# Patient Record
Sex: Male | Born: 1955
Health system: Southern US, Community
[De-identification: ages and names within clinical notes are randomized; demographics above are authoritative.]

## PROBLEM LIST (undated history)

## (undated) DIAGNOSIS — K219 Gastro-esophageal reflux disease without esophagitis: Secondary | ICD-10-CM

## (undated) DIAGNOSIS — E785 Hyperlipidemia, unspecified: Secondary | ICD-10-CM

## (undated) DIAGNOSIS — K56609 Unspecified intestinal obstruction, unspecified as to partial versus complete obstruction: Secondary | ICD-10-CM

## (undated) DIAGNOSIS — N189 Chronic kidney disease, unspecified: Secondary | ICD-10-CM

## (undated) DIAGNOSIS — Z9289 Personal history of other medical treatment: Secondary | ICD-10-CM

## (undated) DIAGNOSIS — R0600 Dyspnea, unspecified: Secondary | ICD-10-CM

## (undated) DIAGNOSIS — I219 Acute myocardial infarction, unspecified: Secondary | ICD-10-CM

## (undated) DIAGNOSIS — I10 Essential (primary) hypertension: Secondary | ICD-10-CM

## (undated) DIAGNOSIS — Z992 Dependence on renal dialysis: Secondary | ICD-10-CM

## (undated) HISTORY — PX: ARTERIOVENOUS GRAFT PLACEMENT: SUR1029

## (undated) HISTORY — DX: Chronic kidney disease, unspecified: N18.9

## (undated) HISTORY — DX: Unspecified intestinal obstruction, unspecified as to partial versus complete obstruction: K56.609

## (undated) HISTORY — PX: SMALL INTESTINE SURGERY: SHX150

---

## 2007-08-22 ENCOUNTER — Ambulatory Visit (HOSPITAL_COMMUNITY): Admission: RE | Admit: 2007-08-22 | Discharge: 2007-08-22 | Payer: Self-pay | Admitting: Nephrology

## 2008-03-08 ENCOUNTER — Ambulatory Visit (HOSPITAL_COMMUNITY): Admission: RE | Admit: 2008-03-08 | Discharge: 2008-03-08 | Payer: Self-pay | Admitting: Nephrology

## 2008-04-23 ENCOUNTER — Encounter: Admission: RE | Admit: 2008-04-23 | Discharge: 2008-04-23 | Payer: Self-pay | Admitting: Nephrology

## 2008-12-06 ENCOUNTER — Emergency Department (HOSPITAL_COMMUNITY): Admission: EM | Admit: 2008-12-06 | Discharge: 2008-12-06 | Payer: Self-pay | Admitting: Emergency Medicine

## 2008-12-16 ENCOUNTER — Ambulatory Visit: Payer: Self-pay | Admitting: *Deleted

## 2009-01-28 ENCOUNTER — Encounter: Admission: RE | Admit: 2009-01-28 | Discharge: 2009-01-28 | Payer: Self-pay | Admitting: Internal Medicine

## 2009-07-21 ENCOUNTER — Emergency Department (HOSPITAL_COMMUNITY): Admission: EM | Admit: 2009-07-21 | Discharge: 2009-07-21 | Payer: Self-pay | Admitting: Emergency Medicine

## 2010-01-25 ENCOUNTER — Ambulatory Visit (HOSPITAL_COMMUNITY): Admission: RE | Admit: 2010-01-25 | Discharge: 2010-01-25 | Payer: Self-pay | Admitting: Nephrology

## 2010-03-24 ENCOUNTER — Ambulatory Visit: Payer: Self-pay | Admitting: Vascular Surgery

## 2010-03-31 ENCOUNTER — Ambulatory Visit: Payer: Self-pay | Admitting: Vascular Surgery

## 2010-06-25 ENCOUNTER — Inpatient Hospital Stay (HOSPITAL_COMMUNITY)
Admission: EM | Admit: 2010-06-25 | Discharge: 2010-07-16 | Payer: Self-pay | Source: Home / Self Care | Attending: Internal Medicine | Admitting: Internal Medicine

## 2010-06-25 ENCOUNTER — Ambulatory Visit: Payer: Self-pay | Admitting: Internal Medicine

## 2010-06-30 ENCOUNTER — Encounter: Payer: Self-pay | Admitting: Internal Medicine

## 2010-07-05 ENCOUNTER — Encounter: Payer: Self-pay | Admitting: Internal Medicine

## 2010-07-07 ENCOUNTER — Encounter (INDEPENDENT_AMBULATORY_CARE_PROVIDER_SITE_OTHER): Payer: Self-pay | Admitting: Internal Medicine

## 2010-08-21 ENCOUNTER — Encounter: Payer: Self-pay | Admitting: Nephrology

## 2010-08-29 NOTE — Procedures (Signed)
Summary: Colonoscopy  Patient: Jon [name] Note: All result statuses are Final unless otherwise noted.  Tests: (1) Colonoscopy (COL)   COL Colonoscopy           [facility]     [place], [place]  [address]           COLONOSCOPY PROCEDURE REPORT           PATIENT:  Jon, [name]  MR#:  [id number]     BIRTHDATE:  [birth year], 53 yrs. old  GENDER:  male     ENDOSCOPIST:  Gatha Mayer, MD, [facility]     REF. BY:  [facility],     PROCEDURE DATE:  06/30/2010     PROCEDURE:  Colonoscopy [id number]     ASA CLASS:  Class III     INDICATIONS:  Abnormal CT of abdomen RLQ pain, ileitis on CT     MEDICATIONS:   Fentanyl 75 mcg IV, Versed 5.5 mg IV           DESCRIPTION OF PROCEDURE:   After the risks benefits and     alternatives of the procedure were thoroughly explained, informed     consent was obtained.  Digital rectal exam was performed and     revealed no abnormalities and normal prostate.   The Pentax     Colonoscope [id number] endoscope was introduced through the anus and     advanced to the terminal ileum which was intubated for a short     distance, limited by fair prep.    The quality of the prep was     fair, using Colyte.  The instrument was then slowly withdrawn as     the colon was fully examined.     <<PROCEDUREIMAGES>>           FINDINGS:  The terminal ileum appeared normal. It could only e     entered in its very distal aspect and he was very sensitive to     this. It could be stenotic as it did not open up, vs. technical     issues with scope insertion.  A normal appearing cecum, ileocecal     valve, and appendiceal orifice were identified. The ascending,     hepatic flexure, transverse, splenic flexure, descending, sigmoid     colon, and rectum appeared unremarkable.   Retroflexed views in     the rectum revealed no abnormalities.    The scope was then     withdrawn from the patient and the procedure completed.        COMPLICATIONS:  None     ENDOSCOPIC IMPRESSION:     1) Normal terminal ileum, very distal aspect only  seen     2) Normal colon     RECOMMENDATIONS:     Continue antibiotics and supportive care. Cause of problems not     found on this exam though could not enter terminal ileum far.     Infection, ischemia and Crohn's remain in the differential.           Re-image (CT vs CT-E) once he is clinically resolved or     significantly better, or do sooner if worse/fails to resolve.     REPEAT EXAM:  In for Colonoscopy. at some point in next 1-2 years     as this procedure was not accurate enough to serve as a screening  colonoscopy.           Gatha Mayer, MD, [facility]           CC:  Jana Hakim, MD     Edrick Oh, MD           n.     eSIGNED:   Gatha Mayer at 06/30/2010 01:02 PM           [employer], [id number]  Note: An exclamation mark (!) indicates a result that was not dispersed into the flowsheet. Document Creation Date: 06/30/2010 1:02 PM _______________________________________________________________________  (1) Order result status: Final Collection or observation date-time: 06/30/2010 12:52 Requested date-time:  Receipt date-time:  Reported date-time:  Referring Physician:   Ordering Physician: Silvano Rusk [phone]) Specimen Source:  Source: Tawanna Cooler Order Number: [id number] Lab site:

## 2010-08-29 NOTE — Procedures (Signed)
Summary: Upper Endoscopy  Patient: Jon [name] Note: All result statuses are Final unless otherwise noted.  Tests: (1) Upper Endoscopy (EGD)   EGD Upper Endoscopy       [facility]     [place], [place]  [address]           ENDOSCOPY PROCEDURE REPORT           PATIENT:  Jon, [name]  MR#:  [id number]     BIRTHDATE:  [birth year], 54 yrs. old  GENDER:  male           ENDOSCOPIST:  Docia Chuck. Geri Seminole, MD     Referred by:  [facility],           PROCEDURE DATE:  07/05/2010     PROCEDURE:  EGD with dilatation over guidewire,     Fluoroscopy to assist with     esophageal dilation     EGD with biopsy, [id number]     ASA CLASS:  Class II     INDICATIONS:  dysphagia ; intermittent solids           MEDICATIONS:   Fentanyl 100 mcg IV, Versed 6 mg IV     TOPICAL ANESTHETIC:  Cetacaine Spray           DESCRIPTION OF PROCEDURE:   After the risks benefits and     alternatives of the procedure were thoroughly explained, informed     consent was obtained.  The [id number]) endoscope was     introduced through the mouth and advanced to the second portion of     the duodenum, without limitations.  The instrument was slowly     withdrawn as the mucosa was fully examined.     <<PROCEDUREIMAGES>>           The esophagus and gastroesophageal junction were completely normal     in appearance.  An erosion was found in the antrum.CLO Bx taken.     Otherwise normal stomach.  The duodenal bulb was normal in     appearance, as was the postbulbar duodenum.    Retroflexed views     revealed a hiatal hernia.           THERAPY: SAVARY GUIDEWIRE PLACED IN GASTRIC ANTRUM VIA     FLUOROSCOPY. 18MM DILATOR PASSED OVER AND MONITORED VIA FLUORO. NO     RESISTANCE OR HEME, TOLERATED WELL           COMPLICATIONS:  None           ENDOSCOPIC IMPRESSION:     1) Normal esophagus. May have subtle ring - s/p dilation 104mm     2) Erosion in the antrum - s/p CLO  test     3) Otherwise normal stomach     4) Normal duodenum     5) A hiatal hernia           RECOMMENDATIONS:     1) Clear liquids until 6pm, then soft foods rest of day. Resume     prior diet tomorrow     2) Treat CLO if +     3) GI follow-up prn           _____________________________     Docia Chuck. Geri Seminole, MD           CC:  Carol Ada, MD,  Silvano Rusk, MD,  Edrick Oh, MD  n.     eSIGNED:   Docia Chuck. Geri Seminole at 07/05/2010 04:00 PM           [employer], [id number]  Note: An exclamation mark (!) indicates a result that was not dispersed into the flowsheet. Document Creation Date: 07/05/2010 4:03 PM _______________________________________________________________________  (1) Order result status: Final Collection or observation date-time: 07/05/2010 15:51 Requested date-time:  Receipt date-time:  Reported date-time:  Referring Physician:   Ordering Physician: Lavena Bullion [phone]) Specimen Source:  Source: Tawanna Cooler Order Number: [id number] Lab site:   Appended Document: Upper Endoscopy L-Clotest (H. pylori), Biopsy - STATUS: Final                                            Perform Date: 7 Dec11 15:45  Ordered By: Ranelle Oyster Date: 7 Dec11 16:24                                       Last Updated Date: 8 Dec11 15:49  Facility: [facility]                              Department: [facility]  Accession #: [id number]                  USN:       [id number]  Findings  Result Name                              Result     Abnl   Normal Range     Units      Perf. Loc.  Helicobacter Screen                      SEE NOTE.         URENEG    UREASE NEGATIVE    CLOTEST DETECTS THE UREASE    ENZYME OF HELICOBACTER    PYLORI IN GASTRIC MUCOSAL    BIOPSIES.  Additional Information  HL7 RESULT STATUS : F  External IF Update Timestamp : 2010-07-06:15:48:00.000000

## 2010-10-09 LAB — RENAL FUNCTION PANEL
Albumin: 2.1 g/dL — ABNORMAL LOW (ref 3.5–5.2)
Albumin: 2.3 g/dL — ABNORMAL LOW (ref 3.5–5.2)
Albumin: 2.7 g/dL — ABNORMAL LOW (ref 3.5–5.2)
BUN: 16 mg/dL (ref 6–23)
Calcium: 9.2 mg/dL (ref 8.4–10.5)
Calcium: 9.6 mg/dL (ref 8.4–10.5)
Chloride: 93 mEq/L — ABNORMAL LOW (ref 96–112)
GFR calc Af Amer: 3 mL/min — ABNORMAL LOW (ref >60)
GFR calc Af Amer: 4 mL/min — ABNORMAL LOW (ref >60)
GFR calc Af Amer: 6 mL/min — ABNORMAL LOW (ref >60)
GFR calc non Af Amer: 3 mL/min — ABNORMAL LOW (ref >60)
GFR calc non Af Amer: 5 mL/min — ABNORMAL LOW (ref >60)
Glucose, Bld: 92 mg/dL (ref 70–99)
Phosphorus: 4.4 mg/dL (ref 2.3–4.6)
Phosphorus: 6.2 mg/dL — ABNORMAL HIGH (ref 2.3–4.6)
Phosphorus: 8.1 mg/dL — ABNORMAL HIGH (ref 2.3–4.6)
Potassium: 3.6 mEq/L (ref 3.5–5.1)
Potassium: 3.9 mEq/L (ref 3.5–5.1)
Potassium: 3.9 mEq/L (ref 3.5–5.1)
Sodium: 133 mEq/L — ABNORMAL LOW (ref 135–145)
Sodium: 136 mEq/L (ref 135–145)
Sodium: 138 mEq/L (ref 135–145)

## 2010-10-09 LAB — CROSSMATCH
ABO/RH(D): O NEG
Antibody Screen: POSITIVE
Donor AG Type: NEGATIVE
Donor AG Type: NEGATIVE
Unit division: 0
Unit division: 0

## 2010-10-09 LAB — CBC
HCT: 26.2 % — ABNORMAL LOW (ref 39.0–52.0)
Hemoglobin: 8.5 g/dL — ABNORMAL LOW (ref 13.0–17.0)
MCHC: 32.4 g/dL (ref 30.0–36.0)
MCHC: 33.6 g/dL (ref 30.0–36.0)
Platelets: 242 10*3/uL (ref 150–400)
Platelets: 303 10*3/uL (ref 150–400)
RBC: 3.38 MIL/uL — ABNORMAL LOW (ref 4.22–5.81)
RDW: 14.7 % (ref 11.5–15.5)
RDW: 14.7 % (ref 11.5–15.5)
RDW: 14.9 % (ref 11.5–15.5)
WBC: 6.8 10*3/uL (ref 4.0–10.5)
WBC: 7.3 10*3/uL (ref 4.0–10.5)
WBC: 8 10*3/uL (ref 4.0–10.5)

## 2010-10-10 LAB — URINALYSIS, MICROSCOPIC ONLY
Ketones, ur: 15 mg/dL — AB
Nitrite: NEGATIVE
pH: 5.5 (ref 5.0–8.0)

## 2010-10-10 LAB — TYPE AND SCREEN
ABO/RH(D): O NEG
DAT, IgG: NEGATIVE
Donor AG Type: NEGATIVE
Donor AG Type: NEGATIVE
Unit division: 0
Unit division: 0

## 2010-10-10 LAB — CBC
HCT: 29 % — ABNORMAL LOW (ref 39.0–52.0)
HCT: 29.4 % — ABNORMAL LOW (ref 39.0–52.0)
HCT: 29.8 % — ABNORMAL LOW (ref 39.0–52.0)
HCT: 30.1 % — ABNORMAL LOW (ref 39.0–52.0)
HCT: 31.4 % — ABNORMAL LOW (ref 39.0–52.0)
HCT: 31.9 % — ABNORMAL LOW (ref 39.0–52.0)
HCT: 33.3 % — ABNORMAL LOW (ref 39.0–52.0)
HCT: 34 % — ABNORMAL LOW (ref 39.0–52.0)
HCT: 36 % — ABNORMAL LOW (ref 39.0–52.0)
HCT: 36.6 % — ABNORMAL LOW (ref 39.0–52.0)
Hemoglobin: 10 g/dL — ABNORMAL LOW (ref 13.0–17.0)
Hemoglobin: 10.5 g/dL — ABNORMAL LOW (ref 13.0–17.0)
Hemoglobin: 10.5 g/dL — ABNORMAL LOW (ref 13.0–17.0)
Hemoglobin: 10.6 g/dL — ABNORMAL LOW (ref 13.0–17.0)
Hemoglobin: 10.9 g/dL — ABNORMAL LOW (ref 13.0–17.0)
Hemoglobin: 12 g/dL — ABNORMAL LOW (ref 13.0–17.0)
Hemoglobin: 8.3 g/dL — ABNORMAL LOW (ref 13.0–17.0)
Hemoglobin: 9.9 g/dL — ABNORMAL LOW (ref 13.0–17.0)
MCH: 30 pg (ref 26.0–34.0)
MCH: 30.1 pg (ref 26.0–34.0)
MCH: 30.4 pg (ref 26.0–34.0)
MCH: 30.4 pg (ref 26.0–34.0)
MCH: 30.4 pg (ref 26.0–34.0)
MCH: 30.5 pg (ref 26.0–34.0)
MCH: 30.5 pg (ref 26.0–34.0)
MCH: 30.6 pg (ref 26.0–34.0)
MCH: 30.7 pg (ref 26.0–34.0)
MCH: 31.3 pg (ref 26.0–34.0)
MCHC: 32.5 g/dL (ref 30.0–36.0)
MCHC: 32.6 g/dL (ref 30.0–36.0)
MCHC: 32.7 g/dL (ref 30.0–36.0)
MCHC: 33.1 g/dL (ref 30.0–36.0)
MCHC: 33.3 g/dL (ref 30.0–36.0)
MCHC: 33.3 g/dL (ref 30.0–36.0)
MCHC: 33.6 g/dL (ref 30.0–36.0)
MCV: 89.9 fL (ref 78.0–100.0)
MCV: 90.9 fL (ref 78.0–100.0)
MCV: 91 fL (ref 78.0–100.0)
MCV: 91.3 fL (ref 78.0–100.0)
MCV: 91.6 fL (ref 78.0–100.0)
MCV: 91.9 fL (ref 78.0–100.0)
MCV: 92.8 fL (ref 78.0–100.0)
MCV: 92.8 fL (ref 78.0–100.0)
Platelets: 158 10*3/uL (ref 150–400)
Platelets: 208 10*3/uL (ref 150–400)
Platelets: 270 10*3/uL (ref 150–400)
Platelets: 293 10*3/uL (ref 150–400)
Platelets: 305 10*3/uL (ref 150–400)
Platelets: 322 10*3/uL (ref 150–400)
Platelets: 326 10*3/uL (ref 150–400)
RBC: 2.77 MIL/uL — ABNORMAL LOW (ref 4.22–5.81)
RBC: 2.99 MIL/uL — ABNORMAL LOW (ref 4.22–5.81)
RBC: 3.2 MIL/uL — ABNORMAL LOW (ref 4.22–5.81)
RBC: 3.27 MIL/uL — ABNORMAL LOW (ref 4.22–5.81)
RBC: 3.36 MIL/uL — ABNORMAL LOW (ref 4.22–5.81)
RBC: 3.47 MIL/uL — ABNORMAL LOW (ref 4.22–5.81)
RBC: 3.55 MIL/uL — ABNORMAL LOW (ref 4.22–5.81)
RBC: 3.92 MIL/uL — ABNORMAL LOW (ref 4.22–5.81)
RDW: 14.2 % (ref 11.5–15.5)
RDW: 14.2 % (ref 11.5–15.5)
RDW: 14.3 % (ref 11.5–15.5)
RDW: 14.4 % (ref 11.5–15.5)
RDW: 14.5 % (ref 11.5–15.5)
RDW: 14.6 % (ref 11.5–15.5)
RDW: 14.7 % (ref 11.5–15.5)
RDW: 14.9 % (ref 11.5–15.5)
WBC: 11.6 10*3/uL — ABNORMAL HIGH (ref 4.0–10.5)
WBC: 11.8 10*3/uL — ABNORMAL HIGH (ref 4.0–10.5)
WBC: 11.8 10*3/uL — ABNORMAL HIGH (ref 4.0–10.5)
WBC: 12 10*3/uL — ABNORMAL HIGH (ref 4.0–10.5)
WBC: 13.5 10*3/uL — ABNORMAL HIGH (ref 4.0–10.5)
WBC: 15.2 10*3/uL — ABNORMAL HIGH (ref 4.0–10.5)
WBC: 19.2 10*3/uL — ABNORMAL HIGH (ref 4.0–10.5)
WBC: 9.4 10*3/uL (ref 4.0–10.5)

## 2010-10-10 LAB — COMPREHENSIVE METABOLIC PANEL
ALT: 10 U/L (ref 0–53)
Albumin: 2.8 g/dL — ABNORMAL LOW (ref 3.5–5.2)
Alkaline Phosphatase: 51 U/L (ref 39–117)
CO2: 27 mEq/L (ref 19–32)
Calcium: 9.8 mg/dL (ref 8.4–10.5)
Creatinine, Ser: 14.03 mg/dL — ABNORMAL HIGH (ref 0.4–1.5)
GFR calc non Af Amer: 4 mL/min — ABNORMAL LOW (ref >60)
Glucose, Bld: 112 mg/dL — ABNORMAL HIGH (ref 70–99)
Potassium: 3.6 mEq/L (ref 3.5–5.1)
Sodium: 137 mEq/L (ref 135–145)
Total Protein: 7.5 g/dL (ref 6.0–8.3)

## 2010-10-10 LAB — DIFFERENTIAL
Basophils Absolute: 0 10*3/uL (ref 0.0–0.1)
Basophils Absolute: 0 10*3/uL (ref 0.0–0.1)
Basophils Absolute: 0 10*3/uL (ref 0.0–0.1)
Eosinophils Absolute: 0 10*3/uL (ref 0.0–0.7)
Eosinophils Absolute: 0.1 10*3/uL (ref 0.0–0.7)
Eosinophils Absolute: 0.3 10*3/uL (ref 0.0–0.7)
Eosinophils Relative: 0 % (ref 0–5)
Lymphocytes Relative: 10 % — ABNORMAL LOW (ref 12–46)
Lymphocytes Relative: 10 % — ABNORMAL LOW (ref 12–46)
Lymphocytes Relative: 10 % — ABNORMAL LOW (ref 12–46)
Lymphocytes Relative: 7 % — ABNORMAL LOW (ref 12–46)
Lymphs Abs: 1.1 10*3/uL (ref 0.7–4.0)
Lymphs Abs: 1.1 10*3/uL (ref 0.7–4.0)
Lymphs Abs: 1.3 10*3/uL (ref 0.7–4.0)
Lymphs Abs: 1.4 10*3/uL (ref 0.7–4.0)
Lymphs Abs: 1.9 10*3/uL (ref 0.7–4.0)
Monocytes Absolute: 0.5 10*3/uL (ref 0.1–1.0)
Monocytes Absolute: 0.6 10*3/uL (ref 0.1–1.0)
Monocytes Absolute: 0.6 10*3/uL (ref 0.1–1.0)
Monocytes Relative: 4 % (ref 3–12)
Neutro Abs: 11.6 10*3/uL — ABNORMAL HIGH (ref 1.7–7.7)
Neutro Abs: 9.6 10*3/uL — ABNORMAL HIGH (ref 1.7–7.7)
Neutro Abs: 9.7 10*3/uL — ABNORMAL HIGH (ref 1.7–7.7)
Neutrophils Relative %: 80 % — ABNORMAL HIGH (ref 43–77)
Neutrophils Relative %: 82 % — ABNORMAL HIGH (ref 43–77)
Neutrophils Relative %: 83 % — ABNORMAL HIGH (ref 43–77)
Neutrophils Relative %: 90 % — ABNORMAL HIGH (ref 43–77)

## 2010-10-10 LAB — BASIC METABOLIC PANEL
BUN: 29 mg/dL — ABNORMAL HIGH (ref 6–23)
BUN: 31 mg/dL — ABNORMAL HIGH (ref 6–23)
BUN: 32 mg/dL — ABNORMAL HIGH (ref 6–23)
BUN: 36 mg/dL — ABNORMAL HIGH (ref 6–23)
CO2: 22 mEq/L (ref 19–32)
CO2: 26 mEq/L (ref 19–32)
CO2: 26 mEq/L (ref 19–32)
CO2: 27 mEq/L (ref 19–32)
Calcium: 9.5 mg/dL (ref 8.4–10.5)
Calcium: 9.7 mg/dL (ref 8.4–10.5)
Chloride: 94 mEq/L — ABNORMAL LOW (ref 96–112)
Chloride: 94 mEq/L — ABNORMAL LOW (ref 96–112)
Chloride: 96 mEq/L (ref 96–112)
Chloride: 98 mEq/L (ref 96–112)
Chloride: 99 mEq/L (ref 96–112)
Creatinine, Ser: 14.64 mg/dL — ABNORMAL HIGH (ref 0.4–1.5)
Creatinine, Ser: 16.04 mg/dL — ABNORMAL HIGH (ref 0.4–1.5)
GFR calc Af Amer: 4 mL/min — ABNORMAL LOW (ref >60)
GFR calc Af Amer: 5 mL/min — ABNORMAL LOW (ref >60)
GFR calc Af Amer: 7 mL/min — ABNORMAL LOW (ref >60)
GFR calc non Af Amer: 3 mL/min — ABNORMAL LOW (ref >60)
GFR calc non Af Amer: 4 mL/min — ABNORMAL LOW (ref >60)
GFR calc non Af Amer: 4 mL/min — ABNORMAL LOW (ref >60)
Glucose, Bld: 107 mg/dL — ABNORMAL HIGH (ref 70–99)
Glucose, Bld: 109 mg/dL — ABNORMAL HIGH (ref 70–99)
Glucose, Bld: 114 mg/dL — ABNORMAL HIGH (ref 70–99)
Glucose, Bld: 90 mg/dL (ref 70–99)
Potassium: 3.3 mEq/L — ABNORMAL LOW (ref 3.5–5.1)
Potassium: 4 mEq/L (ref 3.5–5.1)
Potassium: 4 mEq/L (ref 3.5–5.1)
Potassium: 5 mEq/L (ref 3.5–5.1)
Sodium: 135 mEq/L (ref 135–145)
Sodium: 136 mEq/L (ref 135–145)
Sodium: 137 mEq/L (ref 135–145)
Sodium: 138 mEq/L (ref 135–145)
Sodium: 139 mEq/L (ref 135–145)

## 2010-10-10 LAB — RENAL FUNCTION PANEL
Albumin: 2.2 g/dL — ABNORMAL LOW (ref 3.5–5.2)
Albumin: 3 g/dL — ABNORMAL LOW (ref 3.5–5.2)
Albumin: 3.1 g/dL — ABNORMAL LOW (ref 3.5–5.2)
BUN: 47 mg/dL — ABNORMAL HIGH (ref 6–23)
BUN: 52 mg/dL — ABNORMAL HIGH (ref 6–23)
BUN: 71 mg/dL — ABNORMAL HIGH (ref 6–23)
CO2: 25 mEq/L (ref 19–32)
CO2: 27 mEq/L (ref 19–32)
CO2: 29 mEq/L (ref 19–32)
Calcium: 8.7 mg/dL (ref 8.4–10.5)
Calcium: 9.4 mg/dL (ref 8.4–10.5)
Calcium: 9.6 mg/dL (ref 8.4–10.5)
Chloride: 92 mEq/L — ABNORMAL LOW (ref 96–112)
Chloride: 93 mEq/L — ABNORMAL LOW (ref 96–112)
Chloride: 94 mEq/L — ABNORMAL LOW (ref 96–112)
Chloride: 95 mEq/L — ABNORMAL LOW (ref 96–112)
Chloride: 96 mEq/L (ref 96–112)
Creatinine, Ser: 16.31 mg/dL — ABNORMAL HIGH (ref 0.4–1.5)
Creatinine, Ser: 18.39 mg/dL — ABNORMAL HIGH (ref 0.4–1.5)
GFR calc Af Amer: 3 mL/min — ABNORMAL LOW (ref >60)
GFR calc Af Amer: 6 mL/min — ABNORMAL LOW (ref >60)
GFR calc non Af Amer: 2 mL/min — ABNORMAL LOW (ref >60)
GFR calc non Af Amer: 3 mL/min — ABNORMAL LOW (ref >60)
GFR calc non Af Amer: 5 mL/min — ABNORMAL LOW (ref >60)
Glucose, Bld: 111 mg/dL — ABNORMAL HIGH (ref 70–99)
Glucose, Bld: 82 mg/dL (ref 70–99)
Phosphorus: 6 mg/dL — ABNORMAL HIGH (ref 2.3–4.6)
Phosphorus: 6.8 mg/dL — ABNORMAL HIGH (ref 2.3–4.6)
Phosphorus: 7 mg/dL — ABNORMAL HIGH (ref 2.3–4.6)
Phosphorus: 9.5 mg/dL (ref 2.3–4.6)
Potassium: 4.2 mEq/L (ref 3.5–5.1)
Potassium: 4.4 mEq/L (ref 3.5–5.1)
Potassium: 4.7 mEq/L (ref 3.5–5.1)
Potassium: 4.7 mEq/L (ref 3.5–5.1)
Sodium: 132 mEq/L — ABNORMAL LOW (ref 135–145)
Sodium: 132 mEq/L — ABNORMAL LOW (ref 135–145)
Sodium: 137 mEq/L (ref 135–145)
Sodium: 138 mEq/L (ref 135–145)

## 2010-10-10 LAB — POCT I-STAT 3, VENOUS BLOOD GAS (G3P V)
Bicarbonate: 29.9 mEq/L — ABNORMAL HIGH (ref 20.0–24.0)
TCO2: 31 mmol/L (ref 0–100)
pH, Ven: 7.516 — ABNORMAL HIGH (ref 7.250–7.300)
pO2, Ven: 23 mmHg — CL (ref 30.0–45.0)

## 2010-10-10 LAB — LITHIUM LEVEL: Lithium Lvl: <0.25 mEq/L — ABNORMAL LOW (ref 0.80–1.40)

## 2010-10-10 LAB — CULTURE, BLOOD (ROUTINE X 2)
Culture  Setup Time: 201111280009
Culture  Setup Time: 201111282127
Culture: NO GROWTH
Culture: NO GROWTH

## 2010-10-10 LAB — POCT I-STAT 3, ART BLOOD GAS (G3+)
Acid-Base Excess: 3 mmol/L — ABNORMAL HIGH (ref 0.0–2.0)
Bicarbonate: 27.7 mEq/L — ABNORMAL HIGH (ref 20.0–24.0)
O2 Saturation: 95 %
Patient temperature: 98.8
TCO2: 29 mmol/L (ref 0–100)
pO2, Arterial: 78 mmHg — ABNORMAL LOW (ref 80.0–100.0)

## 2010-10-10 LAB — LIPASE, BLOOD: Lipase: 35 U/L (ref 11–59)

## 2010-10-10 LAB — PROCALCITONIN
Procalcitonin: 13.65 ng/mL
Procalcitonin: 32.79 ng/mL

## 2010-10-10 LAB — LACTIC ACID, PLASMA
Lactic Acid, Venous: 0.7 mmol/L (ref 0.5–2.2)
Lactic Acid, Venous: 0.9 mmol/L (ref 0.5–2.2)
Lactic Acid, Venous: 1.5 mmol/L (ref 0.5–2.2)

## 2010-10-10 LAB — PROTIME-INR: INR: 1.1 (ref 0.00–1.49)

## 2010-10-10 LAB — URINE CULTURE: Culture  Setup Time: 201112061416

## 2010-10-10 LAB — GLUCOSE, CAPILLARY: Glucose-Capillary: 98 mg/dL (ref 70–99)

## 2010-10-10 LAB — MRSA PCR SCREENING: MRSA by PCR: NEGATIVE

## 2010-10-10 LAB — APTT: aPTT: 38 seconds — ABNORMAL HIGH (ref 24–37)

## 2010-10-10 LAB — C-REACTIVE PROTEIN: CRP: 3 mg/dL — ABNORMAL HIGH (ref <0.6)

## 2010-10-30 LAB — CBC
HCT: 30.7 % — ABNORMAL LOW (ref 39.0–52.0)
Hemoglobin: 10.7 g/dL — ABNORMAL LOW (ref 13.0–17.0)
MCHC: 34.8 g/dL (ref 30.0–36.0)
MCV: 91.4 fL (ref 78.0–100.0)
Platelets: 154 K/uL (ref 150–400)
RBC: 3.36 MIL/uL — ABNORMAL LOW (ref 4.22–5.81)
RDW: 13.6 % (ref 11.5–15.5)
WBC: 8.5 K/uL (ref 4.0–10.5)

## 2010-10-30 LAB — COMPREHENSIVE METABOLIC PANEL
ALT: 13 U/L (ref 0–53)
AST: 21 U/L (ref 0–37)
Alkaline Phosphatase: 57 U/L (ref 39–117)
Calcium: 9.4 mg/dL (ref 8.4–10.5)
GFR calc Af Amer: 4 mL/min — ABNORMAL LOW (ref >60)
Glucose, Bld: 113 mg/dL — ABNORMAL HIGH (ref 70–99)
Potassium: 3.7 mEq/L (ref 3.5–5.1)
Sodium: 138 mEq/L (ref 135–145)
Total Protein: 7.8 g/dL (ref 6.0–8.3)

## 2010-10-30 LAB — DIFFERENTIAL
Basophils Relative: 0 % (ref 0–1)
Eosinophils Absolute: 0.1 10*3/uL (ref 0.0–0.7)
Eosinophils Relative: 2 % (ref 0–5)
Lymphs Abs: 1.3 10*3/uL (ref 0.7–4.0)
Monocytes Absolute: 0.6 10*3/uL (ref 0.1–1.0)
Monocytes Relative: 7 % (ref 3–12)
Neutrophils Relative %: 76 % (ref 43–77)

## 2010-11-07 LAB — COMPREHENSIVE METABOLIC PANEL
AST: 23 U/L (ref 0–37)
Albumin: 3.8 g/dL (ref 3.5–5.2)
Alkaline Phosphatase: 49 U/L (ref 39–117)
BUN: 29 mg/dL — ABNORMAL HIGH (ref 6–23)
CO2: 28 mEq/L (ref 19–32)
Chloride: 97 mEq/L (ref 96–112)
GFR calc non Af Amer: 4 mL/min — ABNORMAL LOW (ref >60)
Potassium: 4.1 mEq/L (ref 3.5–5.1)
Total Bilirubin: 0.6 mg/dL (ref 0.3–1.2)

## 2010-11-07 LAB — DIFFERENTIAL
Basophils Absolute: 0 10*3/uL (ref 0.0–0.1)
Basophils Relative: 0 % (ref 0–1)
Eosinophils Relative: 3 % (ref 0–5)
Monocytes Absolute: 0.5 10*3/uL (ref 0.1–1.0)
Neutro Abs: 7.2 10*3/uL (ref 1.7–7.7)

## 2010-11-07 LAB — URINALYSIS, ROUTINE W REFLEX MICROSCOPIC
Bilirubin Urine: NEGATIVE
Glucose, UA: NEGATIVE mg/dL
Ketones, ur: NEGATIVE mg/dL
Leukocytes, UA: NEGATIVE
Protein, ur: >300 mg/dL — AB
pH: 7 (ref 5.0–8.0)

## 2010-11-07 LAB — CBC
HCT: 35.3 % — ABNORMAL LOW (ref 39.0–52.0)
MCV: 88.4 fL (ref 78.0–100.0)
Platelets: 156 10*3/uL (ref 150–400)
RBC: 4 MIL/uL — ABNORMAL LOW (ref 4.22–5.81)
WBC: 9.4 10*3/uL (ref 4.0–10.5)

## 2010-11-07 LAB — URINE MICROSCOPIC-ADD ON

## 2010-12-12 NOTE — Consult Note (Signed)
VASCULAR SURGERY CONSULTATION   Jon [name], Jon [name]  DOB:  29-Aug-1955                                       12/16/2008  [id number]   REFERRAL DIAGNOSIS:  Aneurysm left upper arm arteriovenous fistula.   HISTORY:  The patient is a 55 year old gentleman with a history of  hemodialysis over the last 3 years.  Had a left upper arm arteriovenous  fistula created in [place], [place], 3 years ago.  Noted to have some  enlargement of the fistula and possible thinning of the fistula over the  hypertrophied section.  Referred to the office urgently today.  The  patient denies bleeding from the fistula.   PAST MEDICAL HISTORY:  1. End-stage renal failure.  2. Hypotension.  3. Bipolar disorder.  4. GERD.   SOCIAL HISTORY:  The patient denies alcohol or tobacco use.  He is  married with four children.  On long-term disability.   FAMILY HISTORY:  Noncontributory.   REVIEW OF SYSTEMS:  The patient notes reflux and a history of chronic  kidney disease.   MEDICATIONS:  1. Lisinopril 20 mg q.h.s.  2. Atacand 32 mg daily.  3. Ranitidine 150 mg b.i.d.  4. Carvedilol 25 mg b.i.d.  5. Sucralfate 1 mg q.i.d.  6. Minoxidil 10 mg b.i.d.  7. Cipro 250 mg daily.  8. Nephro-Vite 1 tablet daily.  9. Lithium carbonate.  10.Vitamin B12.   ALLERGIES:  None known.   PHYSICAL EXAMINATION:  General:  A well-appearing 55 year old gentleman.  Alert and oriented.  No acute distress.  Vital signs:  BP 159/89, pulse  97 per minute.  Left upper extremity:  This reveals a hypertrophied left  brachiocephalic arteriovenous fistula.  No active bleeding.  There is  moderate aneurysmal change.  Minimal skin thinning.  No evidence of  ulceration.  Fistula is patent.   IMPRESSION:  1. End-stage renal failure.  2. Hypertension.  3. Bipolar disorder.   RECOMMENDATIONS:  The patient does have a hypertrophied left upper arm  arteriovenous fistula.  However, there is no significant true  aneurysmal  formation, no evidence of ulceration over the fistula.  No active  bleeding.   Continue use of the fistula.  Stick away from sensitive sites and  aneurysmal sites.   Dorothea Glassman, M.D.  Electronically Signed  PGH/MEDQ  D:  12/16/2008  T:  12/17/2008  Job:  [id number]   cc:   Jana Hakim, M.D.  [facility]  [employer]

## 2010-12-12 NOTE — Assessment & Plan Note (Signed)
OFFICE VISIT   TOMAR, CLIPPARD  DOB:  Aug 19, 1955                                       03/31/2010  [id number]   CHIEF COMPLAINT:  Left arm arteriovenous fistula aneurysm.   HISTORY OF PRESENT ILLNESS:  This is a 55 year old gentleman who I had  seen last week, who presented with concerns of an aneurysm of his left  brachiocephalic fistula.  He still continues to be able to dialyze  without any difficulties.  He has not had any bleeding complications  with this fistula.  He returned here today for duplex imaging of this  access and has had no neurologic or motor complaints associated with  this fistula.  At this point, the dialysis technicians are avoiding  cannulating the 2 areas that I had previously noted to him were probably  pseudoaneurysmal.   His past medical history, past surgical history, social history, family  history, medications to allergies are completely unchanged from last  week.  Similarly, the review of systems is unchanged from last week.   PHYSICAL EXAMINATION:  Vital signs:  Temperature 97.4, a blood pressure  of 140/80, a heart rate of 88, and respirations of 12.  On general exam,  he was in no apparent distress.  Obviously dilated left arteriovenous  fistula.  On focused exam of left arm, he had an easily palpable  brachial pulse and radial pulse.  Intrinsic/extrinsic motor function in  the left hand was intact.  His incision was grossly intact.  The  entirety of his brachiocephalic arteriovenous fistula is aneurysmal.  There are 2 areas as noted last time that have pseudoaneurysmal changes.  However, there are no findings in the skin consistent with possible  imminent rupture.  This is well-healed without any signs of recent  cannulation.   He had a left arteriovenous fistula duplex.  It demonstrates end-flow  velocities in the brachial artery of 186 cm/sec with a maximal velocity  in the artery up to 259.  The anastomosis  itself has a velocity of 154  and throughout the venous flow the velocities ranged from 132- to 240.  No focal narrowing or significantly increased velocities were noted on  this study.   MEDICAL DECISION MAKING:  This is a 55 year old gentleman with a left  brachiocephalic arteriovenous fistula that is aneurysmal.  He is still  able to continue dialysis at this point and the flow rates from the  arterial to the venous outflow are all good.  I did discuss with the  patient that he is at increased risk for possible bleeding complications  as the entirety of this arteriovenous fistula is aneurysmal.  He, at  this point, does not want  consideration of any ligation or revision of  this fistula.  At this point, I think it is reasonable to continue using  this fistula and, if he develops any bleeding complications, it will  likely be at the pseudoaneurysm sites.  At that point, I will consider  plicating these areas.  But, at this point, I feel that no further  intervention is necessary.  He can follow up as needed.     Adele Barthel, MD  Electronically Signed   BC/MEDQ  D:  03/31/2010  T:  04/04/2010  Job:  [id number]   cc:   Louis Meckel, M.D.

## 2010-12-12 NOTE — Procedures (Signed)
VASCULAR LAB EXAM   INDICATION:  Malfunctioning AV fistula.   HISTORY:  Diabetes:  No.  Cardiac:  No.  Hypertension:  Yes.   EXAM:  Left arm AV fistula duplex.   IMPRESSION:  1. The left brachial to cephalic arteriovenous fistula is patent.  The      outflow vein of the left arteriovenous fistula demonstrates      significant tortuosity and dilatation with no focal narrowing or      increased velocities noted.  There is mild chronic venous fibrous      scarring noted in the left antecubital fossa level outflow vein      that does not appear to obstruct flow.  2. Additional measurements noted on the attached worksheet.   ___________________________________________  Adele Barthel, MD   CH/MEDQ  D:  03/31/2010  T:  03/31/2010  Job:  [id number]

## 2010-12-12 NOTE — Assessment & Plan Note (Signed)
OFFICE VISIT   Jon [name], Jon [name]  DOB:  1956-02-25                                       03/24/2010  [id number]   HISTORY OF PRESENT ILLNESS:  This is a 55 year old gentleman previously  seen at this office for an aneurysmal left arteriovenous fistula.  His  brachiocephalic arteriovenous fistula was placed greater than 4 years  ago.  He had seen actually Dr. Amedeo Plenty last year in May 2010.  At that  point there was some thinning of the right skin over the fistula with  aneurysmal changes.  He recently developed some sort of malfunction in  his left arteriovenous fistula with low flow rate during recent dialysis  runs.  This was then referred to Dr. Annamaria Boots, interventional radiologist,  who performed a fistulogram of the venous outflow.  This fistulogram  demonstrated aneurysms as previously noted; however, the cephalic vein  continued to be patent, with good central vein patency.  He was referred  to vascular surgery to evaluate for the aneurysms and this arteriovenous  fistula.  At this he is still able to dialyze from this left  arteriovenous fistula.  He does not have any pain during dialysis.  There are no complications noted so far while dialyzing.  He has no  previous history any type of steal symptoms.  Has not recently had any  episodes of bleeding from his cannulation site or any episode of  uncontrolled bleeding.   PAST MEDICAL HISTORY:  #1.  End-stage renal disease.  #2.  Hypertension.  #3.  Bipolar disorder.  #4.  Gastroesophageal reflux disease.   SOCIAL HISTORY:  He is single and currently disabled.  He denies smoking  or any alcohol or illicit drug use.   FAMILY HISTORY:  Significant for high blood pressure in the brothers and  sisters.   REVIEW OF SYSTEMS:  Notable for only reflux disease.  Otherwise the rest  of the 12-point review of systems was negative.   MEDICATIONS:  Medications with allergies were listed in his chart.   PHYSICAL  EXAMINATION:  VITALS Today, blood 98, temperature 98.0, blood  pressure 143/86, heart rate 93, respirations of 12.  General:  Well-developed, well-nourished.  No apparent distress.  Obvious aneurysmal degeneration in the left arteriovenous fistula.  On ENT examination, hearing was intact with no obvious external  abnormalities.  The oropharynx was without any erythema or exudate.  No  obvious caries are noted.  The nasal septum was noted to be midline.  The eyes were equal.  Extraocular movements were intact.  Pupils were  equal, round, reactive to light.  Accommodation was intact.  Cardiovascular:  Regular rate and rhythm.  Normal S1, S2.  No murmurs,  rubs, thrills or gallops were noted.  Palpable pulses in both upper  extremities and lower extremities.  No carotid bruits appreciated  bilaterally.  Respiratory: Clear to auscultation bilaterally, symmetric expansion.  Good air movement.  No rhonchi, rales or wheezes were noted.  GI:  He had a soft abdomen that was nontender, nondistended.  No obvious  masses.  I do not appreciate an abdominal aortic aneurysm.  There was no  costovertebral angle tenderness.  Musculoskeletal:  Had 5/5 strength bilaterally, with normal gait.  Skin:  The left upper extremity demonstrates an aneurysmal  brachiocephalic fistula along the entire length of this fistula.  There  is  a pulsatile thrill throughout.  There is no area of significant  concern for impending rupture  The skin is well-healed along this entire  length.  Neurologic:  Grossly his motor strength was intact throughout as well as  sensation.  He has good hand grip and bilateral upper extremities and  extrinsic muscle strength in hands were intact.  He had gross sensation  intact within the left hand where the fistula is located.  Psychiatric:  Judgment appeared good.  His affect and mood appeared to  be within normal.  Lymphatic exam:  There were no obvious palpable cervical or inguinal   lymph nodes.   I reviewed Dr. Fritz Pickerel venous fistulogram and it interrogates the venous  outflow tract including the central veins which are widely patent  throughout.  Even the smallest area of the cephalic vein is noted to be  widely patent.  There is no obvious thrombus on the injections.  The  arterial arm was not imaged in this case but presumably widely patent.   MEDICAL DECISION MAKING:  This is a 55 year old gentleman with now a 77-  year-old brachiocephalic arteriovenous fistula that has a couple of  areas probably pseudoaneurysmal change on top of aneurysmal change of  the entirety of this brachiocephalic fistula.  At this point the skin  appears intact.  The two areas are delineated by hyperpigmentation.  These two areas should avoid any repeat at cannulation, but I do not  feel there is any advantage at this point doing a plication of his two  areas given that he has been able to continue with his dialysis with no  bleeding complications.  Based on the venogram, I see no reason for the  low flow rates unless he has arterial inflow issue, so as the  fistulogram did not interrogate the arterial side of this fistula, he  will need a left arm access duplex to see if there is any stenosis  there, and then we will also evaluate velocities along the entirety of  this length of this arteriovenous fistula.  So based on those findings I  will then make a decision on any interventions necessary.  I have  discussed the option of plicating the two areas of pseudoaneurysmal  change on top of his aneurysmal change.  At this point the patient  agrees with me that there is probably no advantage.  If he ends up  developing bleeding complications at these two sites, at that point I  think there is a good indication to revising this and trying to plicate  it.  My concern is that if we do attempt to plicate it, we may in fact  induce thrombosis and then cause deterioration of his function in his   arteriovenous fistula.  He is going to have his left arm fistula duplex  completed next week, and he will follow up with me.   Thank for the opportunity to be involved with this patient's care!     Adele Barthel, MD  Electronically Signed   BC/MEDQ  D:  03/24/2010  T:  03/27/2010  Job:  [id number]   cc:   Louis Meckel, M.D.

## 2010-12-12 NOTE — H&P (Signed)
[name], [name] NO.:  [id number]   MEDICAL RECORD NO.:  [id number]          PATIENT TYPE:  EMS   LOCATION:  [facility]                         FACILITY:  [facility]   PHYSICIAN:  Orpah Melter, MD       DATE OF BIRTH:  Dec 06, 1955   DATE OF ADMISSION:  12/06/2008  DATE OF DISCHARGE:                              HISTORY & PHYSICAL   PRIMARY CARE PHYSICIAN:  Jana Hakim, MD   HISTORY OF PRESENT ILLNESS:  The patient is a 55 year old male with  acute onset, nausea, vomiting, and severe right lower quadrant abdominal  pain.  The patient denies fevers, chills, and night sweats.  Denies  diarrhea or constipation.  The patient denies previous episodes of this  pain.  The patient denies alcohol history.  The patient is seen  emergency department.  He was found to have no evidence of infection.  He received abdominal CT scan that showed a moderate pericardial  effusion, but no evidence of intra-abdominal pathology.  The patient had  chest x-ray that showed some volume overload and cardiomegaly, but no  evidence of acute cardiopulmonary disease.  The patient is now being  discharged to home.  His pain has resolved.  He will follow up with his  primary care physician and his hemodialysis on Tuesday, Thursday, and  Saturday.   PAST MEDICAL HISTORY:  1. End-stage renal disease, hemodialysis dependent.  2. Hypertension.  3. Bipolar disorder.  4. Gastroesophageal reflux disease.   SOCIAL HISTORY:  The patient denies alcohol or tobacco use.   FAMILY HISTORY:  Not available.   REVIEW OF SYSTEMS:  All other comprehensive review systems are negative.   ALLERGIES:  He has no known drug allergies.   CURRENT MEDICATIONS:  1. Lisinopril 20 mg p.o. at bedtime.  2. Atacand 32 mg p.o. daily.  3. Ranitidine hydrochloride 150 mg p.o. b.i.d.  4. Carvedilol 25 mg p.o. b.i.d.  5. Sucralfate 1 g p.o. q.i.d.  6. Minoxidil 10 mg p.o. b.i.d.  7. Cipro 250 mg p.o. daily.  8.  Nephro-Vite 1 p.o. daily.  9. Lithium carbonate.  10.Vitamin B12 supplements.   PHYSICAL EXAMINATION:  GENERAL:  This is a well-developed and well-  nourished [place] American male, currently in no apparent distress.  VITAL SIGNS:  Blood pressure 151/81, heart rate 76, respiratory rate 16,  and temperature 97.3.  HEENT:  No jugular venous distention or lymphadenopathy.  Oropharynx is  clear.  Mucous membranes are pink and moist.  TMs clear bilaterally.  Pupils are equally reactive to light and accommodation.  Extraocular  muscles are intact.  CARDIOVASCULAR:  Regular rate and rhythm without murmurs, rubs, or  gallops.  PULMONARY:  Lungs are clear to auscultation bilaterally.  ABDOMEN:  Soft.  Mild tenderness to palpation in right lower quadrant,  but no evidence of rebound or guarding.  Bowel sounds are present.  EXTREMITIES:  No clubbing, cyanosis, or edema.  He has good peripheral  pulses, dorsalis pedis, and radial arteries.  He is able to move all  extremities.  NEUROLOGIC:  Cranial nerves II through XII are  grossly intact.  He has  no focal neurological deficits.   STUDIES:  1. Chest x-ray shows volume overload and cardiomegaly.  2. Abdominal pelvic CT shows moderate pericardial effusion and      coronary artery disease manifested by arteriosclerosis.   LABORATORIES:  White count 9.4, H and H 12 and 35, MCV 88, and platelets  156.  Sodium 137, potassium 4.1, chloride 97, bicarb 20, BUN 29,  creatinine 14, and glucose 123.   ASSESSMENT AND PLAN:  1. Abdominal pain without signs of infection or infarction.  The      patient does not have evidence of biliary obstruction, does not      have any evidence of acute appendicitis or cholecystitis.  The      patient does not have any blood in his urine, and so I believe that      there is no further testing to be gained from admission to the      hospital.  At this time, he has been instructed to follow up with      his primary care  physician in this week and return if his symptoms      continue.  2. End-stage renal disease.  The patient will continue with dialysis      on Tuesday, Thursday, and Saturday.  3. Gastroesophageal reflux disease, currently stable.  4. Bipolar disorder.  The patient has his lithium levels checked      weekly and says that he has been within range.  5. Cardiomegaly with pericardial effusion.  Cardiologist was      consulted, however, they said that there is no intervention for      them in this case and he is instructed to follow up as an      outpatient with his primary care physician.  6. Disposition.  The patient will be discharged to home.   History and physical was constructed by reviewing past medical history,  conferring with emergency medical room physician, and reviewing the  emergency medical record.   TIME SPENT:  One hour.      Orpah Melter, MD  Electronically Signed     JF/MEDQ  D:  12/06/2008  T:  12/07/2008  Job:  [id number]   cc:   Jana Hakim, M.D.

## 2011-08-02 DIAGNOSIS — N186 End stage renal disease: Secondary | ICD-10-CM | POA: Diagnosis not present

## 2011-08-02 DIAGNOSIS — D631 Anemia in chronic kidney disease: Secondary | ICD-10-CM | POA: Diagnosis not present

## 2011-08-02 DIAGNOSIS — N2581 Secondary hyperparathyroidism of renal origin: Secondary | ICD-10-CM | POA: Diagnosis not present

## 2011-08-02 DIAGNOSIS — Z992 Dependence on renal dialysis: Secondary | ICD-10-CM | POA: Diagnosis not present

## 2011-08-02 DIAGNOSIS — D509 Iron deficiency anemia, unspecified: Secondary | ICD-10-CM | POA: Diagnosis not present

## 2011-08-23 DIAGNOSIS — E1159 Type 2 diabetes mellitus with other circulatory complications: Secondary | ICD-10-CM | POA: Diagnosis not present

## 2011-08-30 DIAGNOSIS — N186 End stage renal disease: Secondary | ICD-10-CM | POA: Diagnosis not present

## 2011-09-01 DIAGNOSIS — N039 Chronic nephritic syndrome with unspecified morphologic changes: Secondary | ICD-10-CM | POA: Diagnosis not present

## 2011-09-01 DIAGNOSIS — N186 End stage renal disease: Secondary | ICD-10-CM | POA: Diagnosis not present

## 2011-09-01 DIAGNOSIS — D631 Anemia in chronic kidney disease: Secondary | ICD-10-CM | POA: Diagnosis not present

## 2011-09-01 DIAGNOSIS — N2581 Secondary hyperparathyroidism of renal origin: Secondary | ICD-10-CM | POA: Diagnosis not present

## 2011-09-01 DIAGNOSIS — D509 Iron deficiency anemia, unspecified: Secondary | ICD-10-CM | POA: Diagnosis not present

## 2011-09-04 DIAGNOSIS — N039 Chronic nephritic syndrome with unspecified morphologic changes: Secondary | ICD-10-CM | POA: Diagnosis not present

## 2011-09-04 DIAGNOSIS — N2581 Secondary hyperparathyroidism of renal origin: Secondary | ICD-10-CM | POA: Diagnosis not present

## 2011-09-04 DIAGNOSIS — N186 End stage renal disease: Secondary | ICD-10-CM | POA: Diagnosis not present

## 2011-09-04 DIAGNOSIS — D509 Iron deficiency anemia, unspecified: Secondary | ICD-10-CM | POA: Diagnosis not present

## 2011-09-06 DIAGNOSIS — N186 End stage renal disease: Secondary | ICD-10-CM | POA: Diagnosis not present

## 2011-09-06 DIAGNOSIS — D509 Iron deficiency anemia, unspecified: Secondary | ICD-10-CM | POA: Diagnosis not present

## 2011-09-06 DIAGNOSIS — N2581 Secondary hyperparathyroidism of renal origin: Secondary | ICD-10-CM | POA: Diagnosis not present

## 2011-09-06 DIAGNOSIS — D631 Anemia in chronic kidney disease: Secondary | ICD-10-CM | POA: Diagnosis not present

## 2011-09-08 DIAGNOSIS — N039 Chronic nephritic syndrome with unspecified morphologic changes: Secondary | ICD-10-CM | POA: Diagnosis not present

## 2011-09-08 DIAGNOSIS — D509 Iron deficiency anemia, unspecified: Secondary | ICD-10-CM | POA: Diagnosis not present

## 2011-09-08 DIAGNOSIS — N2581 Secondary hyperparathyroidism of renal origin: Secondary | ICD-10-CM | POA: Diagnosis not present

## 2011-09-08 DIAGNOSIS — N186 End stage renal disease: Secondary | ICD-10-CM | POA: Diagnosis not present

## 2011-09-10 DIAGNOSIS — Z01818 Encounter for other preprocedural examination: Secondary | ICD-10-CM | POA: Diagnosis not present

## 2011-09-10 DIAGNOSIS — I120 Hypertensive chronic kidney disease with stage 5 chronic kidney disease or end stage renal disease: Secondary | ICD-10-CM | POA: Insufficient documentation

## 2011-09-10 DIAGNOSIS — Z7682 Awaiting organ transplant status: Secondary | ICD-10-CM | POA: Diagnosis not present

## 2011-09-10 DIAGNOSIS — I517 Cardiomegaly: Secondary | ICD-10-CM | POA: Diagnosis not present

## 2011-09-10 DIAGNOSIS — N189 Chronic kidney disease, unspecified: Secondary | ICD-10-CM | POA: Diagnosis not present

## 2011-09-11 DIAGNOSIS — D509 Iron deficiency anemia, unspecified: Secondary | ICD-10-CM | POA: Diagnosis not present

## 2011-09-11 DIAGNOSIS — N186 End stage renal disease: Secondary | ICD-10-CM | POA: Diagnosis not present

## 2011-09-11 DIAGNOSIS — D631 Anemia in chronic kidney disease: Secondary | ICD-10-CM | POA: Diagnosis not present

## 2011-09-11 DIAGNOSIS — N2581 Secondary hyperparathyroidism of renal origin: Secondary | ICD-10-CM | POA: Diagnosis not present

## 2011-09-13 DIAGNOSIS — N186 End stage renal disease: Secondary | ICD-10-CM | POA: Diagnosis not present

## 2011-09-13 DIAGNOSIS — N039 Chronic nephritic syndrome with unspecified morphologic changes: Secondary | ICD-10-CM | POA: Diagnosis not present

## 2011-09-13 DIAGNOSIS — D509 Iron deficiency anemia, unspecified: Secondary | ICD-10-CM | POA: Diagnosis not present

## 2011-09-13 DIAGNOSIS — N2581 Secondary hyperparathyroidism of renal origin: Secondary | ICD-10-CM | POA: Diagnosis not present

## 2011-09-15 DIAGNOSIS — D509 Iron deficiency anemia, unspecified: Secondary | ICD-10-CM | POA: Diagnosis not present

## 2011-09-15 DIAGNOSIS — N186 End stage renal disease: Secondary | ICD-10-CM | POA: Diagnosis not present

## 2011-09-15 DIAGNOSIS — N2581 Secondary hyperparathyroidism of renal origin: Secondary | ICD-10-CM | POA: Diagnosis not present

## 2011-09-15 DIAGNOSIS — N039 Chronic nephritic syndrome with unspecified morphologic changes: Secondary | ICD-10-CM | POA: Diagnosis not present

## 2011-09-18 DIAGNOSIS — N2581 Secondary hyperparathyroidism of renal origin: Secondary | ICD-10-CM | POA: Diagnosis not present

## 2011-09-18 DIAGNOSIS — D509 Iron deficiency anemia, unspecified: Secondary | ICD-10-CM | POA: Diagnosis not present

## 2011-09-18 DIAGNOSIS — N039 Chronic nephritic syndrome with unspecified morphologic changes: Secondary | ICD-10-CM | POA: Diagnosis not present

## 2011-09-18 DIAGNOSIS — N186 End stage renal disease: Secondary | ICD-10-CM | POA: Diagnosis not present

## 2011-09-20 DIAGNOSIS — N2581 Secondary hyperparathyroidism of renal origin: Secondary | ICD-10-CM | POA: Diagnosis not present

## 2011-09-20 DIAGNOSIS — N186 End stage renal disease: Secondary | ICD-10-CM | POA: Diagnosis not present

## 2011-09-20 DIAGNOSIS — D631 Anemia in chronic kidney disease: Secondary | ICD-10-CM | POA: Diagnosis not present

## 2011-09-20 DIAGNOSIS — D509 Iron deficiency anemia, unspecified: Secondary | ICD-10-CM | POA: Diagnosis not present

## 2011-09-22 DIAGNOSIS — D509 Iron deficiency anemia, unspecified: Secondary | ICD-10-CM | POA: Diagnosis not present

## 2011-09-22 DIAGNOSIS — N2581 Secondary hyperparathyroidism of renal origin: Secondary | ICD-10-CM | POA: Diagnosis not present

## 2011-09-22 DIAGNOSIS — D631 Anemia in chronic kidney disease: Secondary | ICD-10-CM | POA: Diagnosis not present

## 2011-09-22 DIAGNOSIS — N186 End stage renal disease: Secondary | ICD-10-CM | POA: Diagnosis not present

## 2011-09-25 DIAGNOSIS — D509 Iron deficiency anemia, unspecified: Secondary | ICD-10-CM | POA: Diagnosis not present

## 2011-09-25 DIAGNOSIS — N186 End stage renal disease: Secondary | ICD-10-CM | POA: Diagnosis not present

## 2011-09-25 DIAGNOSIS — N039 Chronic nephritic syndrome with unspecified morphologic changes: Secondary | ICD-10-CM | POA: Diagnosis not present

## 2011-09-25 DIAGNOSIS — N2581 Secondary hyperparathyroidism of renal origin: Secondary | ICD-10-CM | POA: Diagnosis not present

## 2011-09-27 DIAGNOSIS — N186 End stage renal disease: Secondary | ICD-10-CM | POA: Diagnosis not present

## 2011-09-27 DIAGNOSIS — N2581 Secondary hyperparathyroidism of renal origin: Secondary | ICD-10-CM | POA: Diagnosis not present

## 2011-09-27 DIAGNOSIS — D509 Iron deficiency anemia, unspecified: Secondary | ICD-10-CM | POA: Diagnosis not present

## 2011-09-27 DIAGNOSIS — N039 Chronic nephritic syndrome with unspecified morphologic changes: Secondary | ICD-10-CM | POA: Diagnosis not present

## 2011-09-29 DIAGNOSIS — N039 Chronic nephritic syndrome with unspecified morphologic changes: Secondary | ICD-10-CM | POA: Diagnosis not present

## 2011-09-29 DIAGNOSIS — N186 End stage renal disease: Secondary | ICD-10-CM | POA: Diagnosis not present

## 2011-09-29 DIAGNOSIS — D631 Anemia in chronic kidney disease: Secondary | ICD-10-CM | POA: Diagnosis not present

## 2011-09-29 DIAGNOSIS — D509 Iron deficiency anemia, unspecified: Secondary | ICD-10-CM | POA: Diagnosis not present

## 2011-09-29 DIAGNOSIS — N2581 Secondary hyperparathyroidism of renal origin: Secondary | ICD-10-CM | POA: Diagnosis not present

## 2011-10-02 DIAGNOSIS — Z01810 Encounter for preprocedural cardiovascular examination: Secondary | ICD-10-CM | POA: Diagnosis not present

## 2011-10-02 DIAGNOSIS — D509 Iron deficiency anemia, unspecified: Secondary | ICD-10-CM | POA: Diagnosis not present

## 2011-10-02 DIAGNOSIS — Z01818 Encounter for other preprocedural examination: Secondary | ICD-10-CM | POA: Diagnosis not present

## 2011-10-02 DIAGNOSIS — D631 Anemia in chronic kidney disease: Secondary | ICD-10-CM | POA: Diagnosis not present

## 2011-10-02 DIAGNOSIS — I4949 Other premature depolarization: Secondary | ICD-10-CM | POA: Diagnosis not present

## 2011-10-02 DIAGNOSIS — N186 End stage renal disease: Secondary | ICD-10-CM | POA: Diagnosis not present

## 2011-10-02 DIAGNOSIS — I498 Other specified cardiac arrhythmias: Secondary | ICD-10-CM | POA: Diagnosis not present

## 2011-10-02 DIAGNOSIS — N2581 Secondary hyperparathyroidism of renal origin: Secondary | ICD-10-CM | POA: Diagnosis not present

## 2011-10-04 DIAGNOSIS — D509 Iron deficiency anemia, unspecified: Secondary | ICD-10-CM | POA: Diagnosis not present

## 2011-10-04 DIAGNOSIS — N039 Chronic nephritic syndrome with unspecified morphologic changes: Secondary | ICD-10-CM | POA: Diagnosis not present

## 2011-10-04 DIAGNOSIS — N2581 Secondary hyperparathyroidism of renal origin: Secondary | ICD-10-CM | POA: Diagnosis not present

## 2011-10-04 DIAGNOSIS — N186 End stage renal disease: Secondary | ICD-10-CM | POA: Diagnosis not present

## 2011-10-06 DIAGNOSIS — D631 Anemia in chronic kidney disease: Secondary | ICD-10-CM | POA: Diagnosis not present

## 2011-10-06 DIAGNOSIS — D509 Iron deficiency anemia, unspecified: Secondary | ICD-10-CM | POA: Diagnosis not present

## 2011-10-06 DIAGNOSIS — N186 End stage renal disease: Secondary | ICD-10-CM | POA: Diagnosis not present

## 2011-10-06 DIAGNOSIS — N2581 Secondary hyperparathyroidism of renal origin: Secondary | ICD-10-CM | POA: Diagnosis not present

## 2011-10-09 DIAGNOSIS — D631 Anemia in chronic kidney disease: Secondary | ICD-10-CM | POA: Diagnosis not present

## 2011-10-09 DIAGNOSIS — D509 Iron deficiency anemia, unspecified: Secondary | ICD-10-CM | POA: Diagnosis not present

## 2011-10-09 DIAGNOSIS — N2581 Secondary hyperparathyroidism of renal origin: Secondary | ICD-10-CM | POA: Diagnosis not present

## 2011-10-09 DIAGNOSIS — N186 End stage renal disease: Secondary | ICD-10-CM | POA: Diagnosis not present

## 2011-10-11 DIAGNOSIS — N039 Chronic nephritic syndrome with unspecified morphologic changes: Secondary | ICD-10-CM | POA: Diagnosis not present

## 2011-10-11 DIAGNOSIS — N186 End stage renal disease: Secondary | ICD-10-CM | POA: Diagnosis not present

## 2011-10-11 DIAGNOSIS — N2581 Secondary hyperparathyroidism of renal origin: Secondary | ICD-10-CM | POA: Diagnosis not present

## 2011-10-11 DIAGNOSIS — D509 Iron deficiency anemia, unspecified: Secondary | ICD-10-CM | POA: Diagnosis not present

## 2011-10-13 DIAGNOSIS — N2581 Secondary hyperparathyroidism of renal origin: Secondary | ICD-10-CM | POA: Diagnosis not present

## 2011-10-13 DIAGNOSIS — D631 Anemia in chronic kidney disease: Secondary | ICD-10-CM | POA: Diagnosis not present

## 2011-10-13 DIAGNOSIS — N186 End stage renal disease: Secondary | ICD-10-CM | POA: Diagnosis not present

## 2011-10-13 DIAGNOSIS — D509 Iron deficiency anemia, unspecified: Secondary | ICD-10-CM | POA: Diagnosis not present

## 2011-10-16 DIAGNOSIS — N186 End stage renal disease: Secondary | ICD-10-CM | POA: Diagnosis not present

## 2011-10-16 DIAGNOSIS — D631 Anemia in chronic kidney disease: Secondary | ICD-10-CM | POA: Diagnosis not present

## 2011-10-16 DIAGNOSIS — D509 Iron deficiency anemia, unspecified: Secondary | ICD-10-CM | POA: Diagnosis not present

## 2011-10-16 DIAGNOSIS — N2581 Secondary hyperparathyroidism of renal origin: Secondary | ICD-10-CM | POA: Diagnosis not present

## 2011-10-18 DIAGNOSIS — D509 Iron deficiency anemia, unspecified: Secondary | ICD-10-CM | POA: Diagnosis not present

## 2011-10-18 DIAGNOSIS — N039 Chronic nephritic syndrome with unspecified morphologic changes: Secondary | ICD-10-CM | POA: Diagnosis not present

## 2011-10-18 DIAGNOSIS — N186 End stage renal disease: Secondary | ICD-10-CM | POA: Diagnosis not present

## 2011-10-18 DIAGNOSIS — N2581 Secondary hyperparathyroidism of renal origin: Secondary | ICD-10-CM | POA: Diagnosis not present

## 2011-10-20 DIAGNOSIS — N186 End stage renal disease: Secondary | ICD-10-CM | POA: Diagnosis not present

## 2011-10-20 DIAGNOSIS — N039 Chronic nephritic syndrome with unspecified morphologic changes: Secondary | ICD-10-CM | POA: Diagnosis not present

## 2011-10-20 DIAGNOSIS — D509 Iron deficiency anemia, unspecified: Secondary | ICD-10-CM | POA: Diagnosis not present

## 2011-10-20 DIAGNOSIS — N2581 Secondary hyperparathyroidism of renal origin: Secondary | ICD-10-CM | POA: Diagnosis not present

## 2011-10-23 DIAGNOSIS — N2581 Secondary hyperparathyroidism of renal origin: Secondary | ICD-10-CM | POA: Diagnosis not present

## 2011-10-23 DIAGNOSIS — N186 End stage renal disease: Secondary | ICD-10-CM | POA: Diagnosis not present

## 2011-10-23 DIAGNOSIS — D631 Anemia in chronic kidney disease: Secondary | ICD-10-CM | POA: Diagnosis not present

## 2011-10-23 DIAGNOSIS — D509 Iron deficiency anemia, unspecified: Secondary | ICD-10-CM | POA: Diagnosis not present

## 2011-10-25 DIAGNOSIS — D509 Iron deficiency anemia, unspecified: Secondary | ICD-10-CM | POA: Diagnosis not present

## 2011-10-25 DIAGNOSIS — D631 Anemia in chronic kidney disease: Secondary | ICD-10-CM | POA: Diagnosis not present

## 2011-10-25 DIAGNOSIS — N2581 Secondary hyperparathyroidism of renal origin: Secondary | ICD-10-CM | POA: Diagnosis not present

## 2011-10-25 DIAGNOSIS — N186 End stage renal disease: Secondary | ICD-10-CM | POA: Diagnosis not present

## 2011-10-27 DIAGNOSIS — D509 Iron deficiency anemia, unspecified: Secondary | ICD-10-CM | POA: Diagnosis not present

## 2011-10-27 DIAGNOSIS — N186 End stage renal disease: Secondary | ICD-10-CM | POA: Diagnosis not present

## 2011-10-27 DIAGNOSIS — N2581 Secondary hyperparathyroidism of renal origin: Secondary | ICD-10-CM | POA: Diagnosis not present

## 2011-10-27 DIAGNOSIS — D631 Anemia in chronic kidney disease: Secondary | ICD-10-CM | POA: Diagnosis not present

## 2011-10-28 DIAGNOSIS — N186 End stage renal disease: Secondary | ICD-10-CM | POA: Diagnosis not present

## 2011-10-30 DIAGNOSIS — N2581 Secondary hyperparathyroidism of renal origin: Secondary | ICD-10-CM | POA: Diagnosis not present

## 2011-10-30 DIAGNOSIS — N186 End stage renal disease: Secondary | ICD-10-CM | POA: Diagnosis not present

## 2011-10-30 DIAGNOSIS — D509 Iron deficiency anemia, unspecified: Secondary | ICD-10-CM | POA: Diagnosis not present

## 2011-11-27 DIAGNOSIS — N186 End stage renal disease: Secondary | ICD-10-CM | POA: Diagnosis not present

## 2011-11-29 DIAGNOSIS — D509 Iron deficiency anemia, unspecified: Secondary | ICD-10-CM | POA: Diagnosis not present

## 2011-11-29 DIAGNOSIS — N186 End stage renal disease: Secondary | ICD-10-CM | POA: Diagnosis not present

## 2011-11-29 DIAGNOSIS — N2581 Secondary hyperparathyroidism of renal origin: Secondary | ICD-10-CM | POA: Diagnosis not present

## 2011-12-01 DIAGNOSIS — D509 Iron deficiency anemia, unspecified: Secondary | ICD-10-CM | POA: Diagnosis not present

## 2011-12-01 DIAGNOSIS — N186 End stage renal disease: Secondary | ICD-10-CM | POA: Diagnosis not present

## 2011-12-01 DIAGNOSIS — N2581 Secondary hyperparathyroidism of renal origin: Secondary | ICD-10-CM | POA: Diagnosis not present

## 2011-12-04 DIAGNOSIS — N2581 Secondary hyperparathyroidism of renal origin: Secondary | ICD-10-CM | POA: Diagnosis not present

## 2011-12-04 DIAGNOSIS — N186 End stage renal disease: Secondary | ICD-10-CM | POA: Diagnosis not present

## 2011-12-04 DIAGNOSIS — D509 Iron deficiency anemia, unspecified: Secondary | ICD-10-CM | POA: Diagnosis not present

## 2011-12-06 DIAGNOSIS — D509 Iron deficiency anemia, unspecified: Secondary | ICD-10-CM | POA: Diagnosis not present

## 2011-12-06 DIAGNOSIS — N2581 Secondary hyperparathyroidism of renal origin: Secondary | ICD-10-CM | POA: Diagnosis not present

## 2011-12-06 DIAGNOSIS — N186 End stage renal disease: Secondary | ICD-10-CM | POA: Diagnosis not present

## 2011-12-08 DIAGNOSIS — D509 Iron deficiency anemia, unspecified: Secondary | ICD-10-CM | POA: Diagnosis not present

## 2011-12-08 DIAGNOSIS — N2581 Secondary hyperparathyroidism of renal origin: Secondary | ICD-10-CM | POA: Diagnosis not present

## 2011-12-08 DIAGNOSIS — N186 End stage renal disease: Secondary | ICD-10-CM | POA: Diagnosis not present

## 2011-12-11 DIAGNOSIS — D509 Iron deficiency anemia, unspecified: Secondary | ICD-10-CM | POA: Diagnosis not present

## 2011-12-11 DIAGNOSIS — N2581 Secondary hyperparathyroidism of renal origin: Secondary | ICD-10-CM | POA: Diagnosis not present

## 2011-12-11 DIAGNOSIS — N186 End stage renal disease: Secondary | ICD-10-CM | POA: Diagnosis not present

## 2011-12-13 DIAGNOSIS — N186 End stage renal disease: Secondary | ICD-10-CM | POA: Diagnosis not present

## 2011-12-13 DIAGNOSIS — D509 Iron deficiency anemia, unspecified: Secondary | ICD-10-CM | POA: Diagnosis not present

## 2011-12-13 DIAGNOSIS — N2581 Secondary hyperparathyroidism of renal origin: Secondary | ICD-10-CM | POA: Diagnosis not present

## 2011-12-15 DIAGNOSIS — N2581 Secondary hyperparathyroidism of renal origin: Secondary | ICD-10-CM | POA: Diagnosis not present

## 2011-12-15 DIAGNOSIS — D509 Iron deficiency anemia, unspecified: Secondary | ICD-10-CM | POA: Diagnosis not present

## 2011-12-15 DIAGNOSIS — N186 End stage renal disease: Secondary | ICD-10-CM | POA: Diagnosis not present

## 2011-12-18 DIAGNOSIS — N186 End stage renal disease: Secondary | ICD-10-CM | POA: Diagnosis not present

## 2011-12-18 DIAGNOSIS — N2581 Secondary hyperparathyroidism of renal origin: Secondary | ICD-10-CM | POA: Diagnosis not present

## 2011-12-18 DIAGNOSIS — D509 Iron deficiency anemia, unspecified: Secondary | ICD-10-CM | POA: Diagnosis not present

## 2011-12-20 DIAGNOSIS — N2581 Secondary hyperparathyroidism of renal origin: Secondary | ICD-10-CM | POA: Diagnosis not present

## 2011-12-20 DIAGNOSIS — N186 End stage renal disease: Secondary | ICD-10-CM | POA: Diagnosis not present

## 2011-12-20 DIAGNOSIS — D509 Iron deficiency anemia, unspecified: Secondary | ICD-10-CM | POA: Diagnosis not present

## 2011-12-22 DIAGNOSIS — D509 Iron deficiency anemia, unspecified: Secondary | ICD-10-CM | POA: Diagnosis not present

## 2011-12-22 DIAGNOSIS — N2581 Secondary hyperparathyroidism of renal origin: Secondary | ICD-10-CM | POA: Diagnosis not present

## 2011-12-22 DIAGNOSIS — N186 End stage renal disease: Secondary | ICD-10-CM | POA: Diagnosis not present

## 2011-12-25 DIAGNOSIS — D509 Iron deficiency anemia, unspecified: Secondary | ICD-10-CM | POA: Diagnosis not present

## 2011-12-25 DIAGNOSIS — N2581 Secondary hyperparathyroidism of renal origin: Secondary | ICD-10-CM | POA: Diagnosis not present

## 2011-12-25 DIAGNOSIS — N186 End stage renal disease: Secondary | ICD-10-CM | POA: Diagnosis not present

## 2011-12-27 DIAGNOSIS — N2581 Secondary hyperparathyroidism of renal origin: Secondary | ICD-10-CM | POA: Diagnosis not present

## 2011-12-27 DIAGNOSIS — N186 End stage renal disease: Secondary | ICD-10-CM | POA: Diagnosis not present

## 2011-12-27 DIAGNOSIS — D509 Iron deficiency anemia, unspecified: Secondary | ICD-10-CM | POA: Diagnosis not present

## 2011-12-28 DIAGNOSIS — N186 End stage renal disease: Secondary | ICD-10-CM | POA: Diagnosis not present

## 2011-12-29 DIAGNOSIS — N2581 Secondary hyperparathyroidism of renal origin: Secondary | ICD-10-CM | POA: Diagnosis not present

## 2011-12-29 DIAGNOSIS — D509 Iron deficiency anemia, unspecified: Secondary | ICD-10-CM | POA: Diagnosis not present

## 2011-12-29 DIAGNOSIS — N186 End stage renal disease: Secondary | ICD-10-CM | POA: Diagnosis not present

## 2012-01-01 DIAGNOSIS — N2581 Secondary hyperparathyroidism of renal origin: Secondary | ICD-10-CM | POA: Diagnosis not present

## 2012-01-01 DIAGNOSIS — N186 End stage renal disease: Secondary | ICD-10-CM | POA: Diagnosis not present

## 2012-01-01 DIAGNOSIS — D509 Iron deficiency anemia, unspecified: Secondary | ICD-10-CM | POA: Diagnosis not present

## 2012-01-03 DIAGNOSIS — D509 Iron deficiency anemia, unspecified: Secondary | ICD-10-CM | POA: Diagnosis not present

## 2012-01-03 DIAGNOSIS — N2581 Secondary hyperparathyroidism of renal origin: Secondary | ICD-10-CM | POA: Diagnosis not present

## 2012-01-03 DIAGNOSIS — N186 End stage renal disease: Secondary | ICD-10-CM | POA: Diagnosis not present

## 2012-01-05 DIAGNOSIS — N186 End stage renal disease: Secondary | ICD-10-CM | POA: Diagnosis not present

## 2012-01-05 DIAGNOSIS — N2581 Secondary hyperparathyroidism of renal origin: Secondary | ICD-10-CM | POA: Diagnosis not present

## 2012-01-05 DIAGNOSIS — D509 Iron deficiency anemia, unspecified: Secondary | ICD-10-CM | POA: Diagnosis not present

## 2012-01-08 DIAGNOSIS — N186 End stage renal disease: Secondary | ICD-10-CM | POA: Diagnosis not present

## 2012-01-08 DIAGNOSIS — D509 Iron deficiency anemia, unspecified: Secondary | ICD-10-CM | POA: Diagnosis not present

## 2012-01-08 DIAGNOSIS — N2581 Secondary hyperparathyroidism of renal origin: Secondary | ICD-10-CM | POA: Diagnosis not present

## 2012-01-10 DIAGNOSIS — D509 Iron deficiency anemia, unspecified: Secondary | ICD-10-CM | POA: Diagnosis not present

## 2012-01-10 DIAGNOSIS — N186 End stage renal disease: Secondary | ICD-10-CM | POA: Diagnosis not present

## 2012-01-10 DIAGNOSIS — N2581 Secondary hyperparathyroidism of renal origin: Secondary | ICD-10-CM | POA: Diagnosis not present

## 2012-01-12 DIAGNOSIS — N186 End stage renal disease: Secondary | ICD-10-CM | POA: Diagnosis not present

## 2012-01-12 DIAGNOSIS — D509 Iron deficiency anemia, unspecified: Secondary | ICD-10-CM | POA: Diagnosis not present

## 2012-01-12 DIAGNOSIS — N2581 Secondary hyperparathyroidism of renal origin: Secondary | ICD-10-CM | POA: Diagnosis not present

## 2012-01-15 DIAGNOSIS — N186 End stage renal disease: Secondary | ICD-10-CM | POA: Diagnosis not present

## 2012-01-15 DIAGNOSIS — D509 Iron deficiency anemia, unspecified: Secondary | ICD-10-CM | POA: Diagnosis not present

## 2012-01-15 DIAGNOSIS — N2581 Secondary hyperparathyroidism of renal origin: Secondary | ICD-10-CM | POA: Diagnosis not present

## 2012-01-17 DIAGNOSIS — D509 Iron deficiency anemia, unspecified: Secondary | ICD-10-CM | POA: Diagnosis not present

## 2012-01-17 DIAGNOSIS — N186 End stage renal disease: Secondary | ICD-10-CM | POA: Diagnosis not present

## 2012-01-17 DIAGNOSIS — N2581 Secondary hyperparathyroidism of renal origin: Secondary | ICD-10-CM | POA: Diagnosis not present

## 2012-01-19 DIAGNOSIS — N2581 Secondary hyperparathyroidism of renal origin: Secondary | ICD-10-CM | POA: Diagnosis not present

## 2012-01-19 DIAGNOSIS — N186 End stage renal disease: Secondary | ICD-10-CM | POA: Diagnosis not present

## 2012-01-19 DIAGNOSIS — D509 Iron deficiency anemia, unspecified: Secondary | ICD-10-CM | POA: Diagnosis not present

## 2012-01-21 IMAGING — CR DG ABDOMEN DECUB ONLY 1V
1 series · 1 of 1 positions shown · non-contrast
Comparison: CT abdomen pelvis 06/25/2010

CLINICAL DATA: Abdominal pain.  Evaluate for obstruction or free
air.

ABDOMEN - 1 VIEW DECUBITUS

[lat decub abd]
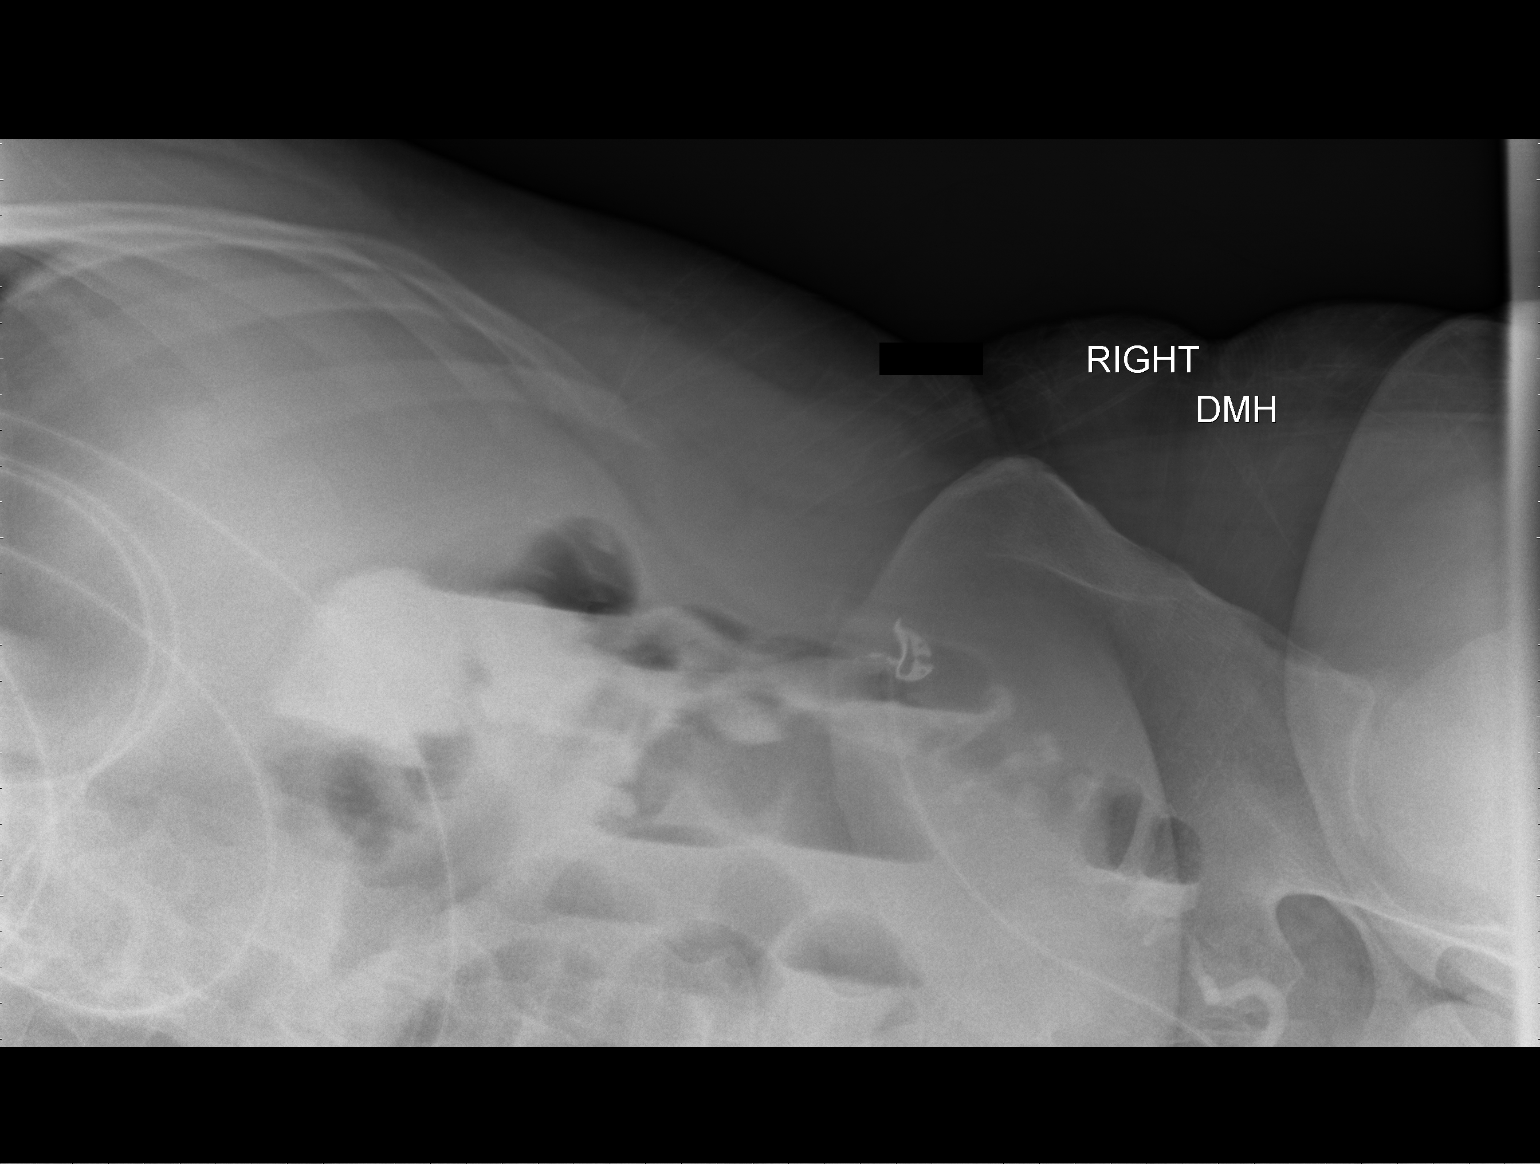

[1 of 1 positions shown; findings below may reference images not displayed]

FINDINGS: Single left lateral decubitus view of the abdomen
incompletely visualizes the entire abdomen.  There is retained
contrast in the colon.  Mildly distended loops of small bowel are
seen with air-fluid levels.  No definite free air.
IMPRESSION: Limited evaluation of the abdomen, with some mildly distended small
bowel and air-fluid levels.  Gas and oral contrast are seen in the
colon.  No definite free air.

## 2012-01-22 DIAGNOSIS — N2581 Secondary hyperparathyroidism of renal origin: Secondary | ICD-10-CM | POA: Diagnosis not present

## 2012-01-22 DIAGNOSIS — D509 Iron deficiency anemia, unspecified: Secondary | ICD-10-CM | POA: Diagnosis not present

## 2012-01-22 DIAGNOSIS — N186 End stage renal disease: Secondary | ICD-10-CM | POA: Diagnosis not present

## 2012-01-24 DIAGNOSIS — N186 End stage renal disease: Secondary | ICD-10-CM | POA: Diagnosis not present

## 2012-01-24 DIAGNOSIS — D509 Iron deficiency anemia, unspecified: Secondary | ICD-10-CM | POA: Diagnosis not present

## 2012-01-24 DIAGNOSIS — N2581 Secondary hyperparathyroidism of renal origin: Secondary | ICD-10-CM | POA: Diagnosis not present

## 2012-01-26 DIAGNOSIS — N2581 Secondary hyperparathyroidism of renal origin: Secondary | ICD-10-CM | POA: Diagnosis not present

## 2012-01-26 DIAGNOSIS — N186 End stage renal disease: Secondary | ICD-10-CM | POA: Diagnosis not present

## 2012-01-26 DIAGNOSIS — D509 Iron deficiency anemia, unspecified: Secondary | ICD-10-CM | POA: Diagnosis not present

## 2012-01-27 DIAGNOSIS — N186 End stage renal disease: Secondary | ICD-10-CM | POA: Diagnosis not present

## 2012-01-29 DIAGNOSIS — N2581 Secondary hyperparathyroidism of renal origin: Secondary | ICD-10-CM | POA: Diagnosis not present

## 2012-01-29 DIAGNOSIS — N186 End stage renal disease: Secondary | ICD-10-CM | POA: Diagnosis not present

## 2012-01-29 DIAGNOSIS — D509 Iron deficiency anemia, unspecified: Secondary | ICD-10-CM | POA: Diagnosis not present

## 2012-02-27 DIAGNOSIS — N186 End stage renal disease: Secondary | ICD-10-CM | POA: Diagnosis not present

## 2012-02-28 DIAGNOSIS — N186 End stage renal disease: Secondary | ICD-10-CM | POA: Diagnosis not present

## 2012-02-28 DIAGNOSIS — N2581 Secondary hyperparathyroidism of renal origin: Secondary | ICD-10-CM | POA: Diagnosis not present

## 2012-02-28 DIAGNOSIS — D509 Iron deficiency anemia, unspecified: Secondary | ICD-10-CM | POA: Diagnosis not present

## 2012-03-05 DIAGNOSIS — I10 Essential (primary) hypertension: Secondary | ICD-10-CM | POA: Diagnosis not present

## 2012-03-05 DIAGNOSIS — L259 Unspecified contact dermatitis, unspecified cause: Secondary | ICD-10-CM | POA: Diagnosis not present

## 2012-03-05 DIAGNOSIS — N186 End stage renal disease: Secondary | ICD-10-CM | POA: Diagnosis not present

## 2012-03-29 DIAGNOSIS — N186 End stage renal disease: Secondary | ICD-10-CM | POA: Diagnosis not present

## 2012-03-30 ENCOUNTER — Emergency Department (HOSPITAL_COMMUNITY)
Admission: EM | Admit: 2012-03-30 | Discharge: 2012-03-31 | Disposition: A | Discharge status: Home / Self Care | Payer: Medicare Other | Attending: Emergency Medicine | Admitting: Emergency Medicine

## 2012-03-30 ENCOUNTER — Encounter (HOSPITAL_COMMUNITY): Payer: Self-pay | Admitting: Emergency Medicine

## 2012-03-30 DIAGNOSIS — K5732 Diverticulitis of large intestine without perforation or abscess without bleeding: Secondary | ICD-10-CM | POA: Diagnosis not present

## 2012-03-30 DIAGNOSIS — N186 End stage renal disease: Secondary | ICD-10-CM | POA: Diagnosis not present

## 2012-03-30 DIAGNOSIS — R109 Unspecified abdominal pain: Secondary | ICD-10-CM | POA: Insufficient documentation

## 2012-03-30 DIAGNOSIS — I120 Hypertensive chronic kidney disease with stage 5 chronic kidney disease or end stage renal disease: Secondary | ICD-10-CM | POA: Diagnosis not present

## 2012-03-30 DIAGNOSIS — I319 Disease of pericardium, unspecified: Secondary | ICD-10-CM | POA: Diagnosis not present

## 2012-03-30 DIAGNOSIS — Z992 Dependence on renal dialysis: Secondary | ICD-10-CM | POA: Diagnosis not present

## 2012-03-30 DIAGNOSIS — K5792 Diverticulitis of intestine, part unspecified, without perforation or abscess without bleeding: Secondary | ICD-10-CM

## 2012-03-30 HISTORY — DX: Dependence on renal dialysis: Z99.2

## 2012-03-30 HISTORY — DX: Essential (primary) hypertension: I10

## 2012-03-30 LAB — COMPREHENSIVE METABOLIC PANEL WITH GFR
ALT: 12 U/L (ref 0–53)
AST: 16 U/L (ref 0–37)
Albumin: 3.9 g/dL (ref 3.5–5.2)
Alkaline Phosphatase: 49 U/L (ref 39–117)
BUN: 30 mg/dL — ABNORMAL HIGH (ref 6–23)
CO2: 27 meq/L (ref 19–32)
Calcium: 10.1 mg/dL (ref 8.4–10.5)
Chloride: 92 meq/L — ABNORMAL LOW (ref 96–112)
Creatinine, Ser: 14.03 mg/dL — ABNORMAL HIGH (ref 0.50–1.35)
GFR calc Af Amer: 4 mL/min — ABNORMAL LOW
GFR calc non Af Amer: 3 mL/min — ABNORMAL LOW
Glucose, Bld: 148 mg/dL — ABNORMAL HIGH (ref 70–99)
Potassium: 3.6 meq/L (ref 3.5–5.1)
Sodium: 135 meq/L (ref 135–145)
Total Bilirubin: 0.4 mg/dL (ref 0.3–1.2)
Total Protein: 7.9 g/dL (ref 6.0–8.3)

## 2012-03-30 LAB — CBC
MCH: 30.2 pg (ref 26.0–34.0)
MCHC: 33.8 g/dL (ref 30.0–36.0)
RDW: 13.7 % (ref 11.5–15.5)
WBC: 11.8 10*3/uL — ABNORMAL HIGH (ref 4.0–10.5)

## 2012-03-30 LAB — LACTIC ACID, PLASMA: Lactic Acid, Venous: 1 mmol/L (ref 0.5–2.2)

## 2012-03-30 MED ORDER — HYDROMORPHONE HCL PF 1 MG/ML IJ SOLN
1.0000 mg | Freq: Once | INTRAMUSCULAR | Status: AC
  Administered 2012-03-31: 1 mg via INTRAVENOUS
  Filled 2012-03-30: qty 1

## 2012-03-30 MED ORDER — SODIUM CHLORIDE 0.9 % IV SOLN
1000.0000 mL | Freq: Once | INTRAVENOUS | Status: AC
  Administered 2012-03-31: 1000 mL via INTRAVENOUS

## 2012-03-30 MED ORDER — ONDANSETRON HCL 4 MG/2ML IJ SOLN
4.0000 mg | Freq: Once | INTRAMUSCULAR | Status: AC
  Administered 2012-03-31: 4 mg via INTRAVENOUS
  Filled 2012-03-30: qty 2

## 2012-03-30 NOTE — ED Notes (Signed)
Reports LLQ pain that woke him up this morning.  Reports vomiting yesterday that resolved.  Denies n/v today.  Pt is a dialysis pt.

## 2012-03-30 NOTE — ED Notes (Signed)
Wait time advised

## 2012-03-31 ENCOUNTER — Emergency Department (HOSPITAL_COMMUNITY): Payer: Medicare Other

## 2012-03-31 ENCOUNTER — Encounter (HOSPITAL_COMMUNITY): Payer: Self-pay | Admitting: Radiology

## 2012-03-31 DIAGNOSIS — I319 Disease of pericardium, unspecified: Secondary | ICD-10-CM | POA: Diagnosis not present

## 2012-03-31 DIAGNOSIS — K5732 Diverticulitis of large intestine without perforation or abscess without bleeding: Secondary | ICD-10-CM | POA: Diagnosis not present

## 2012-03-31 MED ORDER — ONDANSETRON HCL 4 MG/2ML IJ SOLN
4.0000 mg | Freq: Once | INTRAMUSCULAR | Status: AC
  Administered 2012-03-31: 4 mg via INTRAVENOUS
  Filled 2012-03-31: qty 2

## 2012-03-31 MED ORDER — METRONIDAZOLE 500 MG PO TABS: 500.0000 mg | ORAL_TABLET | Freq: Two times a day (BID) | ORAL | Status: AC

## 2012-03-31 MED ORDER — METRONIDAZOLE IN NACL 5-0.79 MG/ML-% IV SOLN
500.0000 mg | Freq: Once | INTRAVENOUS | Status: AC
  Administered 2012-03-31: 500 mg via INTRAVENOUS
  Filled 2012-03-31: qty 100

## 2012-03-31 MED ORDER — IOHEXOL 300 MG/ML  SOLN
100.0000 mL | Freq: Once | INTRAMUSCULAR | Status: AC | PRN
  Administered 2012-03-31: 100 mL via INTRAVENOUS

## 2012-03-31 MED ORDER — HYDROCODONE-ACETAMINOPHEN 5-325 MG PO TABS
1.0000 | ORAL_TABLET | Freq: Three times a day (TID) | ORAL | Status: AC | PRN
Start: 2012-03-31 — End: 2012-04-10

## 2012-03-31 MED ORDER — CIPROFLOXACIN IN D5W 400 MG/200ML IV SOLN
400.0000 mg | Freq: Once | INTRAVENOUS | Status: AC
  Administered 2012-03-31: 400 mg via INTRAVENOUS
  Filled 2012-03-31: qty 200

## 2012-03-31 MED ORDER — CIPROFLOXACIN HCL 500 MG PO TABS: 500.0000 mg | ORAL_TABLET | Freq: Two times a day (BID) | ORAL | Status: AC

## 2012-03-31 MED ORDER — HYDROMORPHONE HCL PF 1 MG/ML IJ SOLN
1.0000 mg | Freq: Once | INTRAMUSCULAR | Status: AC
  Administered 2012-03-31: 1 mg via INTRAVENOUS
  Filled 2012-03-31: qty 1

## 2012-03-31 MED ORDER — IOHEXOL 300 MG/ML  SOLN: 20.0000 mL | INTRAMUSCULAR | Status: AC

## 2012-03-31 NOTE — ED Provider Notes (Signed)
History     CSN: [id number]  Arrival date & time 03/30/12  [id number]   First MD Initiated Contact with Patient 03/30/12 2256      Chief Complaint  Patient presents with  . Abdominal Pain    (Consider location/radiation/quality/duration/timing/severity/associated sxs/prior treatment) HPI The patient presents with concerns of nausea, vomiting, abdominal pain.  Symptoms began approximately 2 days ago, with nausea and vomiting.  Over the past day he is also developed diffuse abdominal pain, worse in the left lower quadrant.  The pain is sharp, not relieved with anything, worse with by mouth intake.  The patient notes that this pain is similar to that he experienced one year ago when he had an SBO.  That required surgical intervention. He denies fevers, chills, confusion, disorientation. The patient is a dialysis patient, last session was yesterday. Past Medical History  Diagnosis Date  . Hypertension   . Dialysis patient     Past Surgical History  Procedure Date  . Arteriovenous graft placement     No family history on file.  History  Substance Use Topics  . Smoking status: Former Research scientist (life sciences)  . Smokeless tobacco: Not on file  . Alcohol Use: No      Review of Systems  Constitutional:       Per HPI, otherwise negative  HENT:       Per HPI, otherwise negative  Eyes: Negative.   Respiratory:       Per HPI, otherwise negative  Cardiovascular:       Per HPI, otherwise negative  Gastrointestinal: Positive for vomiting.  Genitourinary: Negative.   Musculoskeletal:       Per HPI, otherwise negative  Skin: Negative.   Neurological: Negative for syncope.    Allergies  Review of patient's allergies indicates no known allergies.  Home Medications   Current Outpatient Rx  Name Route Sig Dispense Refill  . LOSARTAN POTASSIUM 100 MG PO TABS Oral Take 100 mg by mouth at bedtime.    Marland Kitchen MINOXIDIL 2.5 MG PO TABS Oral Take 5 mg by mouth at bedtime.    Marland Kitchen RENA-VITE PO TABS Oral Take 1  tablet by mouth at bedtime.      BP 150/74  Pulse 104  Temp 100 F (37.8 C) (Oral)  Resp 20  SpO2 96%  Physical Exam  Nursing note and vitals reviewed. Constitutional: He is oriented to person, place, and time. He appears well-developed. No distress.  HENT:  Head: Normocephalic and atraumatic.  Eyes: Conjunctivae and EOM are normal.  Cardiovascular: Normal rate and regular rhythm.   Pulmonary/Chest: Effort normal. No stridor. No respiratory distress.  Abdominal: He exhibits no distension.       Pain diffusely throughout the abdomen, with guarding, but no peritoneal findings.  Musculoskeletal: He exhibits no edema.       Fistula is clean, dry, intact, with palpable thrill  Neurological: He is alert and oriented to person, place, and time.  Skin: Skin is warm and dry.  Psychiatric: He has a normal mood and affect.    ED Course  Procedures (including critical care time)  Labs Reviewed  CBC - Abnormal; Notable for the following:    WBC 11.8 (*)     RBC 4.20 (*)     Hemoglobin 12.7 (*)     HCT 37.6 (*)     All other components within normal limits  COMPREHENSIVE METABOLIC PANEL - Abnormal; Notable for the following:    Chloride 92 (*)     Glucose, Bld  148 (*)     BUN 30 (*)     Creatinine, Ser 14.03 (*)     GFR calc non Af Amer 3 (*)     GFR calc Af Amer 4 (*)     All other components within normal limits  LIPASE, BLOOD  LACTIC ACID, PLASMA   No results found.   No diagnosis found.   O2: 99%ra, normal   MDM  This patient with renal disease, prior SBO now presents with new abdominal pain.  Given the patient's history, there is immediate suspicion of obstructive process, though other intra-abdominal pathology was considered.  The patient's labs and CT are consistent with acute diverticulitis.  On repeat evaluation the patient was much more comfortable, and given the absence of distress, significantly abnormal vital signs is appropriate for continued evaluation as an  outpatient.  The patient received his initial antibiotics in the emergency department and was discharged in stable condition.  Carmin Muskrat, MD 03/31/12 [phone]

## 2012-03-31 NOTE — ED Notes (Signed)
Patient transported to CT 

## 2012-03-31 NOTE — ED Notes (Signed)
Call to CT re: pt completion of oral contrast

## 2012-04-01 DIAGNOSIS — N186 End stage renal disease: Secondary | ICD-10-CM | POA: Diagnosis not present

## 2012-04-01 DIAGNOSIS — D509 Iron deficiency anemia, unspecified: Secondary | ICD-10-CM | POA: Diagnosis not present

## 2012-04-01 DIAGNOSIS — Z23 Encounter for immunization: Secondary | ICD-10-CM | POA: Diagnosis not present

## 2012-04-01 DIAGNOSIS — N2581 Secondary hyperparathyroidism of renal origin: Secondary | ICD-10-CM | POA: Diagnosis not present

## 2012-04-11 ENCOUNTER — Encounter: Payer: Self-pay | Admitting: Internal Medicine

## 2012-04-28 DIAGNOSIS — N186 End stage renal disease: Secondary | ICD-10-CM | POA: Diagnosis not present

## 2012-04-29 DIAGNOSIS — N2581 Secondary hyperparathyroidism of renal origin: Secondary | ICD-10-CM | POA: Diagnosis not present

## 2012-04-29 DIAGNOSIS — D509 Iron deficiency anemia, unspecified: Secondary | ICD-10-CM | POA: Diagnosis not present

## 2012-04-29 DIAGNOSIS — N186 End stage renal disease: Secondary | ICD-10-CM | POA: Diagnosis not present

## 2012-05-01 DIAGNOSIS — N186 End stage renal disease: Secondary | ICD-10-CM | POA: Diagnosis not present

## 2012-05-01 DIAGNOSIS — D509 Iron deficiency anemia, unspecified: Secondary | ICD-10-CM | POA: Diagnosis not present

## 2012-05-01 DIAGNOSIS — N2581 Secondary hyperparathyroidism of renal origin: Secondary | ICD-10-CM | POA: Diagnosis not present

## 2012-05-03 DIAGNOSIS — D509 Iron deficiency anemia, unspecified: Secondary | ICD-10-CM | POA: Diagnosis not present

## 2012-05-03 DIAGNOSIS — N2581 Secondary hyperparathyroidism of renal origin: Secondary | ICD-10-CM | POA: Diagnosis not present

## 2012-05-03 DIAGNOSIS — N186 End stage renal disease: Secondary | ICD-10-CM | POA: Diagnosis not present

## 2012-05-05 ENCOUNTER — Encounter: Payer: Self-pay | Admitting: Internal Medicine

## 2012-05-06 DIAGNOSIS — N186 End stage renal disease: Secondary | ICD-10-CM | POA: Diagnosis not present

## 2012-05-06 DIAGNOSIS — D509 Iron deficiency anemia, unspecified: Secondary | ICD-10-CM | POA: Diagnosis not present

## 2012-05-06 DIAGNOSIS — N2581 Secondary hyperparathyroidism of renal origin: Secondary | ICD-10-CM | POA: Diagnosis not present

## 2012-05-07 ENCOUNTER — Encounter: Payer: Self-pay | Admitting: Internal Medicine

## 2012-05-07 ENCOUNTER — Ambulatory Visit (INDEPENDENT_AMBULATORY_CARE_PROVIDER_SITE_OTHER): Payer: Medicare Other | Admitting: Internal Medicine

## 2012-05-07 VITALS — BP 182/100 | HR 80 | Ht 72.0 in | Wt 233.2 lb

## 2012-05-07 DIAGNOSIS — Z1211 Encounter for screening for malignant neoplasm of colon: Secondary | ICD-10-CM | POA: Diagnosis not present

## 2012-05-07 DIAGNOSIS — Z8719 Personal history of other diseases of the digestive system: Secondary | ICD-10-CM | POA: Diagnosis not present

## 2012-05-07 DIAGNOSIS — R1013 Epigastric pain: Secondary | ICD-10-CM

## 2012-05-07 DIAGNOSIS — I10 Essential (primary) hypertension: Secondary | ICD-10-CM | POA: Insufficient documentation

## 2012-05-07 DIAGNOSIS — N186 End stage renal disease: Secondary | ICD-10-CM | POA: Insufficient documentation

## 2012-05-07 DIAGNOSIS — R112 Nausea with vomiting, unspecified: Secondary | ICD-10-CM

## 2012-05-07 MED ORDER — PANTOPRAZOLE SODIUM 40 MG PO TBEC: 40.0000 mg | DELAYED_RELEASE_TABLET | Freq: Every day | ORAL | Status: DC

## 2012-05-07 MED ORDER — PEG-KCL-NACL-NASULF-NA ASC-C 100 G PO SOLR: 1.0000 | Freq: Once | ORAL | Status: DC

## 2012-05-07 NOTE — Patient Instructions (Addendum)
You have been scheduled for a colonoscopy with propofol. Please follow written instructions given to you at your visit today.  Please pick up your prep kit at the pharmacy within the next 1-3 days. If you use inhalers (even only as needed), please bring them with you on the day of your procedure.  We have sent the following medications to your pharmacy for you to pick up at your convenience: Moviprep, protonix

## 2012-05-07 NOTE — Progress Notes (Signed)
Patient ID: Jon [name], male   DOB: 05/19/1956, 56 y.o.   MRN: [id number]  SUBJECTIVE: HPI Jon [name] is a 56 year old male with a PMH of end-stage renal disease on dialysis, small bowel obstruction status post ileocecectomy in December 2011, and hypertension who is seen in consultation at the request of Dr. Moshe Cipro for evaluation of epigastric abdominal pain, nausea and vomiting. The patient reports burning epigastric pain on multiple days a week which is worse after eating. This is associated with nausea and vomiting. At times vomiting is the only thing he can find to relieve the burning pain. Is been going on for months to almost a year. His appetite is good, but he is at times nervous to be due to the pain. He does not have dysphagia or odynophagia. He does report heartburn. He's not currently taking anything for this issue. Certain foods tend to make his symptoms were such as spicy or greasy foods. He is recovering from a recent bout of diverticulitis in the left colon diagnosed by CT scan during an emergency room visit. He completed antibiotics and his abdominal pain has resolved. He reports his bowel habits have been normal recently without blood or melena. He denies diarrhea or constipation.  He did have a colonoscopy in December 2011 performed by Dr. Carlean Purl during his hospitalization to evaluate an abnormal terminal ileum seen by CT. The prep was fair and deemed not adequate for screening. He also had an upper endoscopy which revealed antral erosions, H. pylori negative and a subtle Schatzki's ring dilated to 18 mm.  The pathology from his ileocecectomy revealed acute inflammation but no granulomas or definite evidence of Crohn's disease.  Review of Systems  As per history of present illness, otherwise negative   Past Medical History  Diagnosis Date  . Hypertension   . Dialysis patient     Current Outpatient Prescriptions  Medication Sig Dispense Refill  . losartan (COZAAR) 100 MG  tablet Take 100 mg by mouth at bedtime.      . minoxidil (LONITEN) 2.5 MG tablet Take 5 mg by mouth at bedtime.      . multivitamin (RENA-VIT) TABS tablet Take 1 tablet by mouth at bedtime.        No Known Allergies  History reviewed. No pertinent family history.  History  Substance Use Topics  . Smoking status: Never Smoker   . Smokeless tobacco: Never Used  . Alcohol Use: No    OBJECTIVE: BP 182/100  Pulse 80  Ht 6' (1.829 m)  Wt 233 lb 3.2 oz (105.779 kg)  BMI 31.63 kg/m2 Constitutional: Well-developed and well-nourished. No distress. HEENT: Normocephalic and atraumatic. Oropharynx is clear and moist. No oropharyngeal exudate. Conjunctivae are normal. Pupils are equal round and reactive to light. No scleral icterus. Neck: Neck supple. Trachea midline. Cardiovascular: Normal rate, regular rhythm and intact distal pulses. No M/R/G Pulmonary/chest: Effort normal and breath sounds normal. No wheezing, rales or rhonchi. Abdominal: Soft, nontender, nondistended. Bowel sounds active throughout. There are no masses palpable. No hepatosplenomegaly. Well-healed midline abdominal scar Extremities: no clubbing, cyanosis, or edema, fistula left upper extremity Lymphadenopathy: No cervical adenopathy noted. Neurological: Alert and oriented to person place and time. Skin: Skin is warm and dry. No rashes noted. Psychiatric: Normal mood and affect. Behavior is normal.  Labs and Imaging -- CBC    Component Value Date/Time   WBC 11.8* 03/30/2012 1832   RBC 4.20* 03/30/2012 1832   HGB 12.7* 03/30/2012 1832   HCT 37.6* 03/30/2012 1832  PLT 155 03/30/2012 1832   MCV 89.5 03/30/2012 1832   MCH 30.2 03/30/2012 1832   MCHC 33.8 03/30/2012 1832   RDW 13.7 03/30/2012 1832   LYMPHSABS 1.1 07/07/2010 0630   MONOABS 0.7 07/07/2010 0630   EOSABS 0.1 07/07/2010 0630   BASOSABS 0.0 07/07/2010 0630    CMP     Component Value Date/Time   NA 135 03/30/2012 1832   K 3.6 03/30/2012 1832   CL 92* 03/30/2012 1832   CO2  27 03/30/2012 1832   GLUCOSE 148* 03/30/2012 1832   BUN 30* 03/30/2012 1832   CREATININE 14.03* 03/30/2012 1832   CALCIUM 10.1 03/30/2012 1832   PROT 7.9 03/30/2012 1832   ALBUMIN 3.9 03/30/2012 1832   AST 16 03/30/2012 1832   ALT 12 03/30/2012 1832   ALKPHOS 49 03/30/2012 1832   BILITOT 0.4 03/30/2012 1832   GFRNONAA 3* 03/30/2012 1832   GFRAA 4* 03/30/2012 1832   Clinical Data: Abdominal pain, chills, nausea, vomiting, loose stools, past history hypertension, dialysis, small bowel obstruction   CT ABDOMEN AND PELVIS WITH CONTRAST -- 03/30/2012   Technique:  Multidetector CT imaging of the abdomen and pelvis was performed following the standard protocol during bolus administration of intravenous contrast. Sagittal and coronal MPR images reconstructed from axial data set.   Contrast: 174mL OMNIPAQUE IOHEXOL 300 MG/ML  SOLN Dilute oral contrast.   Comparison: 07/06/2010   Findings: Dependent atelectasis at lung bases. Atrophic kidneys with numerous cysts compatible with end-stage renal disease and dialysis. Liver, spleen, pancreas, and adrenal glands normal appearance. Diverticula identified at the distal descending and sigmoid colon with segmental colonic wall thickening and pericolic inflammatory changes at the descending sigmoid junction compatible with acute diverticulitis. Edema is seen within the sigmoid mesocolon without discrete abscess. No free intraperitoneal air.   Appendix not visualized. Bladder decompressed. Stomach and bowel loops otherwise normal appearance. No mass, adenopathy, or free fluid. No hernia or acute bony lesion. Scattered atherosclerotic calcification. Small pericardial effusion.   IMPRESSION: Acute diverticulitis at the descending/sigmoid junction. No evidence of intraperitoneal perforation or abscess. 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 Small pericardial effusion.  ASSESSMENT AND PLAN: 56 year old male with a PMH of end-stage  renal disease on dialysis, small bowel obstruction status post ileocecectomy in December 2011, and hypertension who is seen in consultation at the request of Dr. Moshe Cipro for evaluation of epigastric abdominal pain, nausea and vomiting  1.  Epigastric pain/nausea/vomiting -- the patient's symptoms may be acid peptic related, or less likely gallbladder disease. I will start him on pantoprazole 40 mg daily and he is advised to take this 30 minutes to one hour before his first meal of the day. I will also prescribe ondansetron 4 mg sublingual every 6 hours as needed for nausea and vomiting.  Given the erosions seen at previous EGD and his worsening symptoms, we will repeat the EGD. We discussed the test including the risks and benefits and he is agreeable to proceed. If his symptoms respond completely to PPI only, this test may be cancelled.  2.  CRC screening/hx of diverticulosis -- the patient has had a colonoscopy before but due to prep was not deemed adequate for colon cancer screening. He also has had recent diverticulitis which has resolved clinically. I recommended repeat colonoscopy for colon cancer screening and he is agreeable to proceed after discussion of the risks and benefits. This be performed at the same time as his EGD.  3.  History of SBO/terminal ileum inflammation --  the patient required ileocecectomy for small bowel obstruction, but pathology not definitely consistent with Crohn's. His inflammation in this region has resolved by recent CT. We'll attempt to reexamine the neoterminal ileum at upcoming colonoscopy, see #2.  Followup to be determined after endoscopy

## 2012-05-08 DIAGNOSIS — D509 Iron deficiency anemia, unspecified: Secondary | ICD-10-CM | POA: Diagnosis not present

## 2012-05-08 DIAGNOSIS — N2581 Secondary hyperparathyroidism of renal origin: Secondary | ICD-10-CM | POA: Diagnosis not present

## 2012-05-08 DIAGNOSIS — N186 End stage renal disease: Secondary | ICD-10-CM | POA: Diagnosis not present

## 2012-05-10 DIAGNOSIS — N186 End stage renal disease: Secondary | ICD-10-CM | POA: Diagnosis not present

## 2012-05-10 DIAGNOSIS — D509 Iron deficiency anemia, unspecified: Secondary | ICD-10-CM | POA: Diagnosis not present

## 2012-05-10 DIAGNOSIS — N2581 Secondary hyperparathyroidism of renal origin: Secondary | ICD-10-CM | POA: Diagnosis not present

## 2012-05-12 ENCOUNTER — Ambulatory Visit (AMBULATORY_SURGERY_CENTER): Payer: Medicare Other | Admitting: Internal Medicine

## 2012-05-12 ENCOUNTER — Encounter: Payer: Self-pay | Admitting: Internal Medicine

## 2012-05-12 VITALS — BP 185/88 | HR 72 | Resp 24 | Ht 72.0 in | Wt 233.0 lb

## 2012-05-12 DIAGNOSIS — I10 Essential (primary) hypertension: Secondary | ICD-10-CM

## 2012-05-12 DIAGNOSIS — R112 Nausea with vomiting, unspecified: Secondary | ICD-10-CM | POA: Diagnosis not present

## 2012-05-12 DIAGNOSIS — K229 Disease of esophagus, unspecified: Secondary | ICD-10-CM | POA: Diagnosis not present

## 2012-05-12 DIAGNOSIS — Z8719 Personal history of other diseases of the digestive system: Secondary | ICD-10-CM | POA: Diagnosis not present

## 2012-05-12 DIAGNOSIS — R1013 Epigastric pain: Secondary | ICD-10-CM | POA: Diagnosis not present

## 2012-05-12 DIAGNOSIS — R110 Nausea: Secondary | ICD-10-CM | POA: Diagnosis not present

## 2012-05-12 DIAGNOSIS — N186 End stage renal disease: Secondary | ICD-10-CM | POA: Diagnosis not present

## 2012-05-12 DIAGNOSIS — Z1211 Encounter for screening for malignant neoplasm of colon: Secondary | ICD-10-CM | POA: Diagnosis not present

## 2012-05-12 DIAGNOSIS — R109 Unspecified abdominal pain: Secondary | ICD-10-CM | POA: Diagnosis not present

## 2012-05-12 DIAGNOSIS — D130 Benign neoplasm of esophagus: Secondary | ICD-10-CM

## 2012-05-12 MED ORDER — SODIUM CHLORIDE 0.9 % IV SOLN: 500.0000 mL | INTRAVENOUS | Status: DC

## 2012-05-12 NOTE — Progress Notes (Signed)
Pt's first blood pressure was 205/105 Dr. Hilarie Fredrickson was aware of this and wanted to proceed with the procedure.  Propofol was started IV second bllod pressure was 227/118.  Retook b/p 193/95. Maw

## 2012-05-12 NOTE — Progress Notes (Signed)
Incomplete colon.  Advanced to the transverse.  Did not advance scope due to hypertension.  212/113 blood pressure.maw

## 2012-05-12 NOTE — Progress Notes (Signed)
Next blood pressure 180/85.  Pt sleeping and resting comfortable. Maw

## 2012-05-12 NOTE — Patient Instructions (Addendum)
YOU HAD AN ENDOSCOPIC PROCEDURE TODAY AT THE [facility]: Refer to the procedure report that was given to you for any specific questions about what was found during the examination.  If the procedure report does not answer your questions, please call your gastroenterologist to clarify.  If you requested that your care partner not be given the details of your procedure findings, then the procedure report has been included in a sealed envelope for you to review at your convenience later.  YOU SHOULD EXPECT: Some feelings of bloating in the abdomen. Passage of more gas than usual.  Walking can help get rid of the air that was put into your GI tract during the procedure and reduce the bloating. If you had a lower endoscopy (such as a colonoscopy or flexible sigmoidoscopy) you may notice spotting of blood in your stool or on the toilet paper. If you underwent a bowel prep for your procedure, then you may not have a normal bowel movement for a few days.  DIET: Your first meal following the procedure should be a light meal and then it is ok to progress to your normal diet.  A half-sandwich or bowl of soup is an example of a good first meal.  Heavy or fried foods are harder to digest and may make you feel nauseous or bloated.  Likewise meals heavy in dairy and vegetables can cause extra gas to form and this can also increase the bloating.  Drink plenty of fluids but you should avoid alcoholic beverages for 24 hours.  ACTIVITY: Your care partner should take you home directly after the procedure.  You should plan to take it easy, moving slowly for the rest of the day.  You can resume normal activity the day after the procedure however you should NOT DRIVE or use heavy machinery for 24 hours (because of the sedation medicines used during the test).    SYMPTOMS TO REPORT IMMEDIATELY: A gastroenterologist can be reached at any hour.  During normal business hours, 8:30 AM to 5:00 PM Monday through Friday,  call [phone].  After hours and on weekends, please call the GI answering service at [phone] who will take a message and have the physician on call contact you.   Following lower endoscopy (colonoscopy or flexible sigmoidoscopy):  Excessive amounts of blood in the stool  Significant tenderness or worsening of abdominal pains  Swelling of the abdomen that is new, acute  Fever of 100F or higher  Following upper endoscopy (EGD)  Vomiting of blood or coffee ground material  New chest pain or pain under the shoulder blades  Painful or persistently difficult swallowing  New shortness of breath  Fever of 100F or higher  Black, tarry-looking stools  FOLLOW UP: If any biopsies were taken you will be contacted by phone or by letter within the next 1-3 weeks.  Call your gastroenterologist if you have not heard about the biopsies in 3 weeks.  Our staff will call the home number listed on your records the next business day following your procedure to check on you and address any questions or concerns that you may have at that time regarding the information given to you following your procedure. This is a courtesy call and so if there is no answer at the home number and we have not heard from you through the emergency physician on call, we will assume that you have returned to your regular daily activities without incident.  SIGNATURES/CONFIDENTIALITY: You and/or your care   partner have signed paperwork which will be entered into your electronic medical record.  These signatures attest to the fact that that the information above on your After Visit Summary has been reviewed and is understood.  Full responsibility of the confidentiality of this discharge information lies with you and/or your care-partner.   Thank-you for choosing [facility] for your medical needs.

## 2012-05-12 NOTE — Progress Notes (Addendum)
Patient did not have preoperative order for IV antibiotic SSI prophylaxis. [id number]   Patient did not experience any of the following events: a burn prior to discharge; a fall within the facility; wrong site/side/patient/procedure/implant event; or a hospital transfer or hospital admission upon discharge from the facility. [phone]  Patient will stay in holding room until his ride gets here for him and his care partner.

## 2012-05-12 NOTE — Progress Notes (Signed)
The pt tolerated the egd well. Maw  

## 2012-05-12 NOTE — Op Note (Signed)
[place]  [address]. [place], [address]   ENDOSCOPY PROCEDURE REPORT  PATIENT: Jon [name], Jon [name]  MR#: [id number] BIRTHDATE: 12/17/1955 , 33  yrs. old GENDER: Male ENDOSCOPIST: Jerene Bears, MD REFERRED BY:  Corliss Parish PROCEDURE DATE:  05/12/2012 PROCEDURE:  EGD w/ biopsy ASA CLASS:     Class III INDICATIONS:  epigastric pain.   nausea.   vomiting. MEDICATIONS: MAC sedation, administered by CRNA and propofol (Diprivan) 350mg  IV TOPICAL ANESTHETIC: Cetacaine Spray  DESCRIPTION OF PROCEDURE: After the risks benefits and alternatives of the procedure were thoroughly explained, informed consent was obtained.  The LB GIF-H180 [id number] endoscope was introduced through the mouth and advanced to the second portion of the duodenum. Without limitations.  The instrument was slowly withdrawn as the mucosa was fully examined.     ESOPHAGUS: A sessile polyp/lesion measuring 5 mm in size was found 28 cm from the incisors.  Multiple biopsies were performed of this lesion.   The esophagus was otherwise normal.  STOMACH: A hiatus hernia was found.   Food residue/retained liquid was found in the gastric fundus/proximal gastric body.  Complete aspiration of this liquid was not possible due to food particles. Thus, complete visualization of the proximal stomach was not possible due to retained food   The stomach otherwise appeared normal.  DUODENUM: The duodenal mucosa showed no abnormalities in the bulb and second portion of the duodenum.  Retroflexed views revealed a hiatal hernia.     The scope was then withdrawn from the patient and the procedure completed.  COMPLICATIONS: There were no complications.  ENDOSCOPIC IMPRESSION: 1.   Sessile lesion measuring 5 mm in size was found 28 cm from the incisors 2.   The esophagus was otherwise normal. 3.   Hiatus hernia was found 4.   Food residue in the gastric fundus/proximal gastric body 5.   The stomach  otherwise appeared normal 6.   The duodenal mucosa showed no abnormalities in the bulb and second portion of the duodenum  RECOMMENDATIONS: 1.  Await biopsy results 2.  Continue current medications 3.  My office will arrange for you to have a Gastric Emptying Scan performed.  This is a radiology test that gives an idea of how well your stomach functions/ empties .   eSigned:  Jerene Bears, MD 05/12/2012 4:14 PM   [id number] Moshe Cipro, MD and The Patient  PATIENT NAME:  [name], [name] MR#: [id number]

## 2012-05-12 NOTE — Op Note (Signed)
[place]  [address]. [place], [address]   COLONOSCOPY PROCEDURE REPORT  PATIENT: Other, Schnitzer  MR#: [id number] BIRTHDATE: 05/31/1956 , 33  yrs. old GENDER: Male ENDOSCOPIST: Jerene Bears, MD REFERRED [id number], Kellie PROCEDURE DATE:  05/12/2012 PROCEDURE:   Colonoscopy, screening and Colonoscopy, incomplete ASA CLASS:   Class III INDICATIONS:average risk screening and follow up for previously diagnosed diverticulitis. MEDICATIONS: MAC sedation, administered by CRNA and propofol (Diprivan) 150mg  IV  DESCRIPTION OF PROCEDURE:   After the risks benefits and alternatives of the procedure were thoroughly explained, informed consent was obtained.  A digital rectal exam revealed no rectal mass.   The LB CF-H180AL [id number]  endoscope was introduced through the anus and advanced to the distal transverse colon. No adverse events experienced.   The quality of the prep was Moviprep fair The instrument was then slowly withdrawn as the colon was fully examined.   COLON FINDINGS: Moderate diverticulosis was noted in the descending colon and sigmoid colon.  Complete colonoscopy  was planned , but procedure was aborted after reaching the distal transverse colon due to persistent hypertension. Retroflexed views revealed internal hemorrhoids.       The scope was withdrawn and the procedure completed.  COMPLICATIONS: The procedure was terminated before all planned exams were completed because of persistent hypertension in patient with known hypertension.  ENDOSCOPIC IMPRESSION: 1.  Moderate diverticulosis was noted in the descending colon and sigmoid colon 2.  Incomplete examination (see above)  RECOMMENDATIONS: 1. High fiber diet 2. Consider repeating the colonoscopy when blood pressure is better controlled.   eSigned:  Jerene Bears, MD 05/12/2012 4:30 PM   cc: Corliss Parish, MD and The Patient

## 2012-05-13 ENCOUNTER — Telehealth: Payer: Self-pay

## 2012-05-13 DIAGNOSIS — N2581 Secondary hyperparathyroidism of renal origin: Secondary | ICD-10-CM | POA: Diagnosis not present

## 2012-05-13 DIAGNOSIS — N186 End stage renal disease: Secondary | ICD-10-CM | POA: Diagnosis not present

## 2012-05-13 DIAGNOSIS — D509 Iron deficiency anemia, unspecified: Secondary | ICD-10-CM | POA: Diagnosis not present

## 2012-05-13 NOTE — Telephone Encounter (Signed)
  Follow up Call-  Call back number 05/12/2012  Post procedure Call Back phone  # [phone]  Permission to leave phone message Yes     Patient questions:  Do you have a fever, pain , or abdominal swelling? no Pain Score  0 *  Have you tolerated food without any problems? yes  Have you been able to return to your normal activities? yes  Do you have any questions about your discharge instructions: Diet   no Medications  no Follow up visit  no  Do you have questions or concerns about your Care? no  Actions: * If pain score is 4 or above: No action needed, pain <4.  Per the pt everything was fine. Maw

## 2012-05-14 ENCOUNTER — Other Ambulatory Visit: Payer: Self-pay | Admitting: *Deleted

## 2012-05-14 DIAGNOSIS — R112 Nausea with vomiting, unspecified: Secondary | ICD-10-CM

## 2012-05-14 DIAGNOSIS — R1013 Epigastric pain: Secondary | ICD-10-CM

## 2012-05-14 NOTE — Progress Notes (Signed)
Spoke with pt to inform him of the appt for the GES. He requested I mail the info to him at: [address] [place], [place]. This is not the address listed in EPIC and the home number's answering machine is for another family. Mailed appt letter.

## 2012-05-15 DIAGNOSIS — N186 End stage renal disease: Secondary | ICD-10-CM | POA: Diagnosis not present

## 2012-05-15 DIAGNOSIS — D509 Iron deficiency anemia, unspecified: Secondary | ICD-10-CM | POA: Diagnosis not present

## 2012-05-15 DIAGNOSIS — N2581 Secondary hyperparathyroidism of renal origin: Secondary | ICD-10-CM | POA: Diagnosis not present

## 2012-05-17 DIAGNOSIS — N2581 Secondary hyperparathyroidism of renal origin: Secondary | ICD-10-CM | POA: Diagnosis not present

## 2012-05-17 DIAGNOSIS — D509 Iron deficiency anemia, unspecified: Secondary | ICD-10-CM | POA: Diagnosis not present

## 2012-05-17 DIAGNOSIS — N186 End stage renal disease: Secondary | ICD-10-CM | POA: Diagnosis not present

## 2012-05-19 ENCOUNTER — Encounter: Payer: Self-pay | Admitting: Internal Medicine

## 2012-05-20 DIAGNOSIS — D509 Iron deficiency anemia, unspecified: Secondary | ICD-10-CM | POA: Diagnosis not present

## 2012-05-20 DIAGNOSIS — N2581 Secondary hyperparathyroidism of renal origin: Secondary | ICD-10-CM | POA: Diagnosis not present

## 2012-05-20 DIAGNOSIS — N186 End stage renal disease: Secondary | ICD-10-CM | POA: Diagnosis not present

## 2012-05-22 DIAGNOSIS — D509 Iron deficiency anemia, unspecified: Secondary | ICD-10-CM | POA: Diagnosis not present

## 2012-05-22 DIAGNOSIS — N186 End stage renal disease: Secondary | ICD-10-CM | POA: Diagnosis not present

## 2012-05-22 DIAGNOSIS — N2581 Secondary hyperparathyroidism of renal origin: Secondary | ICD-10-CM | POA: Diagnosis not present

## 2012-05-24 DIAGNOSIS — D509 Iron deficiency anemia, unspecified: Secondary | ICD-10-CM | POA: Diagnosis not present

## 2012-05-24 DIAGNOSIS — N2581 Secondary hyperparathyroidism of renal origin: Secondary | ICD-10-CM | POA: Diagnosis not present

## 2012-05-24 DIAGNOSIS — N186 End stage renal disease: Secondary | ICD-10-CM | POA: Diagnosis not present

## 2012-05-27 ENCOUNTER — Other Ambulatory Visit (HOSPITAL_COMMUNITY): Payer: Medicare Other

## 2012-05-27 DIAGNOSIS — N2581 Secondary hyperparathyroidism of renal origin: Secondary | ICD-10-CM | POA: Diagnosis not present

## 2012-05-27 DIAGNOSIS — N186 End stage renal disease: Secondary | ICD-10-CM | POA: Diagnosis not present

## 2012-05-27 DIAGNOSIS — D509 Iron deficiency anemia, unspecified: Secondary | ICD-10-CM | POA: Diagnosis not present

## 2012-05-29 DIAGNOSIS — N186 End stage renal disease: Secondary | ICD-10-CM | POA: Diagnosis not present

## 2012-05-29 DIAGNOSIS — D509 Iron deficiency anemia, unspecified: Secondary | ICD-10-CM | POA: Diagnosis not present

## 2012-05-29 DIAGNOSIS — N2581 Secondary hyperparathyroidism of renal origin: Secondary | ICD-10-CM | POA: Diagnosis not present

## 2012-05-31 DIAGNOSIS — N186 End stage renal disease: Secondary | ICD-10-CM | POA: Diagnosis not present

## 2012-05-31 DIAGNOSIS — N2581 Secondary hyperparathyroidism of renal origin: Secondary | ICD-10-CM | POA: Diagnosis not present

## 2012-05-31 DIAGNOSIS — D509 Iron deficiency anemia, unspecified: Secondary | ICD-10-CM | POA: Diagnosis not present

## 2012-06-02 ENCOUNTER — Encounter (HOSPITAL_COMMUNITY)
Admission: RE | Admit: 2012-06-02 | Discharge: 2012-06-02 | Disposition: A | Discharge status: Home / Self Care | Payer: Medicare Other | Source: Ambulatory Visit | Attending: Internal Medicine | Admitting: Internal Medicine

## 2012-06-02 DIAGNOSIS — R112 Nausea with vomiting, unspecified: Secondary | ICD-10-CM

## 2012-06-02 DIAGNOSIS — R1013 Epigastric pain: Secondary | ICD-10-CM | POA: Insufficient documentation

## 2012-06-02 MED ORDER — TECHNETIUM TC 99M SULFUR COLLOID
2.1000 | Freq: Once | INTRAVENOUS | Status: AC | PRN
  Administered 2012-06-02: 2.1 via INTRAVENOUS

## 2012-06-03 DIAGNOSIS — D509 Iron deficiency anemia, unspecified: Secondary | ICD-10-CM | POA: Diagnosis not present

## 2012-06-03 DIAGNOSIS — N2581 Secondary hyperparathyroidism of renal origin: Secondary | ICD-10-CM | POA: Diagnosis not present

## 2012-06-03 DIAGNOSIS — N186 End stage renal disease: Secondary | ICD-10-CM | POA: Diagnosis not present

## 2012-06-04 DIAGNOSIS — E119 Type 2 diabetes mellitus without complications: Secondary | ICD-10-CM | POA: Diagnosis not present

## 2012-06-04 DIAGNOSIS — N186 End stage renal disease: Secondary | ICD-10-CM | POA: Diagnosis not present

## 2012-06-05 DIAGNOSIS — N2581 Secondary hyperparathyroidism of renal origin: Secondary | ICD-10-CM | POA: Diagnosis not present

## 2012-06-05 DIAGNOSIS — N186 End stage renal disease: Secondary | ICD-10-CM | POA: Diagnosis not present

## 2012-06-05 DIAGNOSIS — D509 Iron deficiency anemia, unspecified: Secondary | ICD-10-CM | POA: Diagnosis not present

## 2012-06-07 DIAGNOSIS — D509 Iron deficiency anemia, unspecified: Secondary | ICD-10-CM | POA: Diagnosis not present

## 2012-06-07 DIAGNOSIS — N2581 Secondary hyperparathyroidism of renal origin: Secondary | ICD-10-CM | POA: Diagnosis not present

## 2012-06-07 DIAGNOSIS — N186 End stage renal disease: Secondary | ICD-10-CM | POA: Diagnosis not present

## 2012-06-10 DIAGNOSIS — N2581 Secondary hyperparathyroidism of renal origin: Secondary | ICD-10-CM | POA: Diagnosis not present

## 2012-06-10 DIAGNOSIS — D509 Iron deficiency anemia, unspecified: Secondary | ICD-10-CM | POA: Diagnosis not present

## 2012-06-10 DIAGNOSIS — N186 End stage renal disease: Secondary | ICD-10-CM | POA: Diagnosis not present

## 2012-06-12 DIAGNOSIS — N2581 Secondary hyperparathyroidism of renal origin: Secondary | ICD-10-CM | POA: Diagnosis not present

## 2012-06-12 DIAGNOSIS — N186 End stage renal disease: Secondary | ICD-10-CM | POA: Diagnosis not present

## 2012-06-12 DIAGNOSIS — D509 Iron deficiency anemia, unspecified: Secondary | ICD-10-CM | POA: Diagnosis not present

## 2012-06-14 DIAGNOSIS — N186 End stage renal disease: Secondary | ICD-10-CM | POA: Diagnosis not present

## 2012-06-14 DIAGNOSIS — D509 Iron deficiency anemia, unspecified: Secondary | ICD-10-CM | POA: Diagnosis not present

## 2012-06-14 DIAGNOSIS — N2581 Secondary hyperparathyroidism of renal origin: Secondary | ICD-10-CM | POA: Diagnosis not present

## 2012-06-17 DIAGNOSIS — N186 End stage renal disease: Secondary | ICD-10-CM | POA: Diagnosis not present

## 2012-06-17 DIAGNOSIS — D509 Iron deficiency anemia, unspecified: Secondary | ICD-10-CM | POA: Diagnosis not present

## 2012-06-17 DIAGNOSIS — N2581 Secondary hyperparathyroidism of renal origin: Secondary | ICD-10-CM | POA: Diagnosis not present

## 2012-06-19 DIAGNOSIS — N2581 Secondary hyperparathyroidism of renal origin: Secondary | ICD-10-CM | POA: Diagnosis not present

## 2012-06-19 DIAGNOSIS — N186 End stage renal disease: Secondary | ICD-10-CM | POA: Diagnosis not present

## 2012-06-19 DIAGNOSIS — D509 Iron deficiency anemia, unspecified: Secondary | ICD-10-CM | POA: Diagnosis not present

## 2012-06-21 DIAGNOSIS — N186 End stage renal disease: Secondary | ICD-10-CM | POA: Diagnosis not present

## 2012-06-21 DIAGNOSIS — D509 Iron deficiency anemia, unspecified: Secondary | ICD-10-CM | POA: Diagnosis not present

## 2012-06-21 DIAGNOSIS — N2581 Secondary hyperparathyroidism of renal origin: Secondary | ICD-10-CM | POA: Diagnosis not present

## 2012-06-24 DIAGNOSIS — N186 End stage renal disease: Secondary | ICD-10-CM | POA: Diagnosis not present

## 2012-06-24 DIAGNOSIS — D509 Iron deficiency anemia, unspecified: Secondary | ICD-10-CM | POA: Diagnosis not present

## 2012-06-24 DIAGNOSIS — N2581 Secondary hyperparathyroidism of renal origin: Secondary | ICD-10-CM | POA: Diagnosis not present

## 2012-06-26 DIAGNOSIS — N186 End stage renal disease: Secondary | ICD-10-CM | POA: Diagnosis not present

## 2012-06-26 DIAGNOSIS — D509 Iron deficiency anemia, unspecified: Secondary | ICD-10-CM | POA: Diagnosis not present

## 2012-06-26 DIAGNOSIS — N2581 Secondary hyperparathyroidism of renal origin: Secondary | ICD-10-CM | POA: Diagnosis not present

## 2012-06-28 DIAGNOSIS — D509 Iron deficiency anemia, unspecified: Secondary | ICD-10-CM | POA: Diagnosis not present

## 2012-06-28 DIAGNOSIS — N186 End stage renal disease: Secondary | ICD-10-CM | POA: Diagnosis not present

## 2012-06-28 DIAGNOSIS — N2581 Secondary hyperparathyroidism of renal origin: Secondary | ICD-10-CM | POA: Diagnosis not present

## 2012-07-01 DIAGNOSIS — N186 End stage renal disease: Secondary | ICD-10-CM | POA: Diagnosis not present

## 2012-07-01 DIAGNOSIS — D509 Iron deficiency anemia, unspecified: Secondary | ICD-10-CM | POA: Diagnosis not present

## 2012-07-01 DIAGNOSIS — N2581 Secondary hyperparathyroidism of renal origin: Secondary | ICD-10-CM | POA: Diagnosis not present

## 2012-07-03 DIAGNOSIS — N2581 Secondary hyperparathyroidism of renal origin: Secondary | ICD-10-CM | POA: Diagnosis not present

## 2012-07-03 DIAGNOSIS — N186 End stage renal disease: Secondary | ICD-10-CM | POA: Diagnosis not present

## 2012-07-03 DIAGNOSIS — D509 Iron deficiency anemia, unspecified: Secondary | ICD-10-CM | POA: Diagnosis not present

## 2012-07-05 DIAGNOSIS — N186 End stage renal disease: Secondary | ICD-10-CM | POA: Diagnosis not present

## 2012-07-05 DIAGNOSIS — N2581 Secondary hyperparathyroidism of renal origin: Secondary | ICD-10-CM | POA: Diagnosis not present

## 2012-07-05 DIAGNOSIS — D509 Iron deficiency anemia, unspecified: Secondary | ICD-10-CM | POA: Diagnosis not present

## 2012-07-08 DIAGNOSIS — N2581 Secondary hyperparathyroidism of renal origin: Secondary | ICD-10-CM | POA: Diagnosis not present

## 2012-07-08 DIAGNOSIS — D509 Iron deficiency anemia, unspecified: Secondary | ICD-10-CM | POA: Diagnosis not present

## 2012-07-08 DIAGNOSIS — N186 End stage renal disease: Secondary | ICD-10-CM | POA: Diagnosis not present

## 2012-07-10 DIAGNOSIS — N2581 Secondary hyperparathyroidism of renal origin: Secondary | ICD-10-CM | POA: Diagnosis not present

## 2012-07-10 DIAGNOSIS — N186 End stage renal disease: Secondary | ICD-10-CM | POA: Diagnosis not present

## 2012-07-10 DIAGNOSIS — D509 Iron deficiency anemia, unspecified: Secondary | ICD-10-CM | POA: Diagnosis not present

## 2012-07-12 DIAGNOSIS — N186 End stage renal disease: Secondary | ICD-10-CM | POA: Diagnosis not present

## 2012-07-12 DIAGNOSIS — D509 Iron deficiency anemia, unspecified: Secondary | ICD-10-CM | POA: Diagnosis not present

## 2012-07-12 DIAGNOSIS — N2581 Secondary hyperparathyroidism of renal origin: Secondary | ICD-10-CM | POA: Diagnosis not present

## 2012-07-15 DIAGNOSIS — D509 Iron deficiency anemia, unspecified: Secondary | ICD-10-CM | POA: Diagnosis not present

## 2012-07-15 DIAGNOSIS — N186 End stage renal disease: Secondary | ICD-10-CM | POA: Diagnosis not present

## 2012-07-15 DIAGNOSIS — N2581 Secondary hyperparathyroidism of renal origin: Secondary | ICD-10-CM | POA: Diagnosis not present

## 2012-07-17 DIAGNOSIS — D509 Iron deficiency anemia, unspecified: Secondary | ICD-10-CM | POA: Diagnosis not present

## 2012-07-17 DIAGNOSIS — N186 End stage renal disease: Secondary | ICD-10-CM | POA: Diagnosis not present

## 2012-07-17 DIAGNOSIS — N2581 Secondary hyperparathyroidism of renal origin: Secondary | ICD-10-CM | POA: Diagnosis not present

## 2012-07-19 DIAGNOSIS — D509 Iron deficiency anemia, unspecified: Secondary | ICD-10-CM | POA: Diagnosis not present

## 2012-07-19 DIAGNOSIS — N2581 Secondary hyperparathyroidism of renal origin: Secondary | ICD-10-CM | POA: Diagnosis not present

## 2012-07-19 DIAGNOSIS — N186 End stage renal disease: Secondary | ICD-10-CM | POA: Diagnosis not present

## 2012-07-21 DIAGNOSIS — N186 End stage renal disease: Secondary | ICD-10-CM | POA: Diagnosis not present

## 2012-07-21 DIAGNOSIS — N2581 Secondary hyperparathyroidism of renal origin: Secondary | ICD-10-CM | POA: Diagnosis not present

## 2012-07-21 DIAGNOSIS — D509 Iron deficiency anemia, unspecified: Secondary | ICD-10-CM | POA: Diagnosis not present

## 2012-07-24 DIAGNOSIS — N2581 Secondary hyperparathyroidism of renal origin: Secondary | ICD-10-CM | POA: Diagnosis not present

## 2012-07-24 DIAGNOSIS — D509 Iron deficiency anemia, unspecified: Secondary | ICD-10-CM | POA: Diagnosis not present

## 2012-07-24 DIAGNOSIS — N186 End stage renal disease: Secondary | ICD-10-CM | POA: Diagnosis not present

## 2012-07-26 DIAGNOSIS — N186 End stage renal disease: Secondary | ICD-10-CM | POA: Diagnosis not present

## 2012-07-26 DIAGNOSIS — N2581 Secondary hyperparathyroidism of renal origin: Secondary | ICD-10-CM | POA: Diagnosis not present

## 2012-07-26 DIAGNOSIS — D509 Iron deficiency anemia, unspecified: Secondary | ICD-10-CM | POA: Diagnosis not present

## 2012-07-28 DIAGNOSIS — D509 Iron deficiency anemia, unspecified: Secondary | ICD-10-CM | POA: Diagnosis not present

## 2012-07-28 DIAGNOSIS — N186 End stage renal disease: Secondary | ICD-10-CM | POA: Diagnosis not present

## 2012-07-28 DIAGNOSIS — N2581 Secondary hyperparathyroidism of renal origin: Secondary | ICD-10-CM | POA: Diagnosis not present

## 2012-07-29 DIAGNOSIS — N186 End stage renal disease: Secondary | ICD-10-CM | POA: Diagnosis not present

## 2012-07-31 DIAGNOSIS — N2581 Secondary hyperparathyroidism of renal origin: Secondary | ICD-10-CM | POA: Diagnosis not present

## 2012-07-31 DIAGNOSIS — D509 Iron deficiency anemia, unspecified: Secondary | ICD-10-CM | POA: Diagnosis not present

## 2012-07-31 DIAGNOSIS — N186 End stage renal disease: Secondary | ICD-10-CM | POA: Diagnosis not present

## 2012-07-31 DIAGNOSIS — Z992 Dependence on renal dialysis: Secondary | ICD-10-CM | POA: Diagnosis not present

## 2012-08-21 DIAGNOSIS — E1159 Type 2 diabetes mellitus with other circulatory complications: Secondary | ICD-10-CM | POA: Diagnosis not present

## 2012-08-29 DIAGNOSIS — N186 End stage renal disease: Secondary | ICD-10-CM | POA: Diagnosis not present

## 2012-08-30 DIAGNOSIS — D509 Iron deficiency anemia, unspecified: Secondary | ICD-10-CM | POA: Diagnosis not present

## 2012-08-30 DIAGNOSIS — N2581 Secondary hyperparathyroidism of renal origin: Secondary | ICD-10-CM | POA: Diagnosis not present

## 2012-08-30 DIAGNOSIS — N186 End stage renal disease: Secondary | ICD-10-CM | POA: Diagnosis not present

## 2012-09-02 DIAGNOSIS — N2581 Secondary hyperparathyroidism of renal origin: Secondary | ICD-10-CM | POA: Diagnosis not present

## 2012-09-02 DIAGNOSIS — N186 End stage renal disease: Secondary | ICD-10-CM | POA: Diagnosis not present

## 2012-09-02 DIAGNOSIS — D509 Iron deficiency anemia, unspecified: Secondary | ICD-10-CM | POA: Diagnosis not present

## 2012-09-04 DIAGNOSIS — N186 End stage renal disease: Secondary | ICD-10-CM | POA: Diagnosis not present

## 2012-09-04 DIAGNOSIS — N2581 Secondary hyperparathyroidism of renal origin: Secondary | ICD-10-CM | POA: Diagnosis not present

## 2012-09-04 DIAGNOSIS — D509 Iron deficiency anemia, unspecified: Secondary | ICD-10-CM | POA: Diagnosis not present

## 2012-09-06 DIAGNOSIS — N186 End stage renal disease: Secondary | ICD-10-CM | POA: Diagnosis not present

## 2012-09-06 DIAGNOSIS — N2581 Secondary hyperparathyroidism of renal origin: Secondary | ICD-10-CM | POA: Diagnosis not present

## 2012-09-06 DIAGNOSIS — D509 Iron deficiency anemia, unspecified: Secondary | ICD-10-CM | POA: Diagnosis not present

## 2012-09-09 DIAGNOSIS — N186 End stage renal disease: Secondary | ICD-10-CM | POA: Diagnosis not present

## 2012-09-09 DIAGNOSIS — N2581 Secondary hyperparathyroidism of renal origin: Secondary | ICD-10-CM | POA: Diagnosis not present

## 2012-09-09 DIAGNOSIS — D509 Iron deficiency anemia, unspecified: Secondary | ICD-10-CM | POA: Diagnosis not present

## 2012-09-10 DIAGNOSIS — N186 End stage renal disease: Secondary | ICD-10-CM | POA: Diagnosis not present

## 2012-09-10 DIAGNOSIS — D509 Iron deficiency anemia, unspecified: Secondary | ICD-10-CM | POA: Diagnosis not present

## 2012-09-10 DIAGNOSIS — N2581 Secondary hyperparathyroidism of renal origin: Secondary | ICD-10-CM | POA: Diagnosis not present

## 2012-09-13 DIAGNOSIS — N2581 Secondary hyperparathyroidism of renal origin: Secondary | ICD-10-CM | POA: Diagnosis not present

## 2012-09-13 DIAGNOSIS — N186 End stage renal disease: Secondary | ICD-10-CM | POA: Diagnosis not present

## 2012-09-13 DIAGNOSIS — D509 Iron deficiency anemia, unspecified: Secondary | ICD-10-CM | POA: Diagnosis not present

## 2012-09-16 DIAGNOSIS — N2581 Secondary hyperparathyroidism of renal origin: Secondary | ICD-10-CM | POA: Diagnosis not present

## 2012-09-16 DIAGNOSIS — N186 End stage renal disease: Secondary | ICD-10-CM | POA: Diagnosis not present

## 2012-09-16 DIAGNOSIS — D509 Iron deficiency anemia, unspecified: Secondary | ICD-10-CM | POA: Diagnosis not present

## 2012-09-18 DIAGNOSIS — N186 End stage renal disease: Secondary | ICD-10-CM | POA: Diagnosis not present

## 2012-09-18 DIAGNOSIS — N2581 Secondary hyperparathyroidism of renal origin: Secondary | ICD-10-CM | POA: Diagnosis not present

## 2012-09-18 DIAGNOSIS — D509 Iron deficiency anemia, unspecified: Secondary | ICD-10-CM | POA: Diagnosis not present

## 2012-09-20 DIAGNOSIS — N186 End stage renal disease: Secondary | ICD-10-CM | POA: Diagnosis not present

## 2012-09-20 DIAGNOSIS — D509 Iron deficiency anemia, unspecified: Secondary | ICD-10-CM | POA: Diagnosis not present

## 2012-09-20 DIAGNOSIS — N2581 Secondary hyperparathyroidism of renal origin: Secondary | ICD-10-CM | POA: Diagnosis not present

## 2012-09-22 DIAGNOSIS — D509 Iron deficiency anemia, unspecified: Secondary | ICD-10-CM | POA: Diagnosis not present

## 2012-09-22 DIAGNOSIS — N2581 Secondary hyperparathyroidism of renal origin: Secondary | ICD-10-CM | POA: Diagnosis not present

## 2012-09-22 DIAGNOSIS — N186 End stage renal disease: Secondary | ICD-10-CM | POA: Diagnosis not present

## 2012-09-24 DIAGNOSIS — D509 Iron deficiency anemia, unspecified: Secondary | ICD-10-CM | POA: Diagnosis not present

## 2012-09-24 DIAGNOSIS — N186 End stage renal disease: Secondary | ICD-10-CM | POA: Diagnosis not present

## 2012-09-24 DIAGNOSIS — N2581 Secondary hyperparathyroidism of renal origin: Secondary | ICD-10-CM | POA: Diagnosis not present

## 2012-09-26 DIAGNOSIS — N186 End stage renal disease: Secondary | ICD-10-CM | POA: Diagnosis not present

## 2012-09-26 DIAGNOSIS — N2581 Secondary hyperparathyroidism of renal origin: Secondary | ICD-10-CM | POA: Diagnosis not present

## 2012-09-26 DIAGNOSIS — D509 Iron deficiency anemia, unspecified: Secondary | ICD-10-CM | POA: Diagnosis not present

## 2012-09-29 DIAGNOSIS — D509 Iron deficiency anemia, unspecified: Secondary | ICD-10-CM | POA: Diagnosis not present

## 2012-09-29 DIAGNOSIS — N2581 Secondary hyperparathyroidism of renal origin: Secondary | ICD-10-CM | POA: Diagnosis not present

## 2012-09-29 DIAGNOSIS — N186 End stage renal disease: Secondary | ICD-10-CM | POA: Diagnosis not present

## 2012-09-29 DIAGNOSIS — D631 Anemia in chronic kidney disease: Secondary | ICD-10-CM | POA: Diagnosis not present

## 2012-10-27 DIAGNOSIS — N186 End stage renal disease: Secondary | ICD-10-CM | POA: Diagnosis not present

## 2012-10-29 DIAGNOSIS — N2581 Secondary hyperparathyroidism of renal origin: Secondary | ICD-10-CM | POA: Diagnosis not present

## 2012-10-29 DIAGNOSIS — N039 Chronic nephritic syndrome with unspecified morphologic changes: Secondary | ICD-10-CM | POA: Diagnosis not present

## 2012-10-29 DIAGNOSIS — N186 End stage renal disease: Secondary | ICD-10-CM | POA: Diagnosis not present

## 2012-10-29 DIAGNOSIS — D509 Iron deficiency anemia, unspecified: Secondary | ICD-10-CM | POA: Diagnosis not present

## 2012-10-31 DIAGNOSIS — D509 Iron deficiency anemia, unspecified: Secondary | ICD-10-CM | POA: Diagnosis not present

## 2012-10-31 DIAGNOSIS — D631 Anemia in chronic kidney disease: Secondary | ICD-10-CM | POA: Diagnosis not present

## 2012-10-31 DIAGNOSIS — N186 End stage renal disease: Secondary | ICD-10-CM | POA: Diagnosis not present

## 2012-10-31 DIAGNOSIS — N2581 Secondary hyperparathyroidism of renal origin: Secondary | ICD-10-CM | POA: Diagnosis not present

## 2012-11-03 DIAGNOSIS — N2581 Secondary hyperparathyroidism of renal origin: Secondary | ICD-10-CM | POA: Diagnosis not present

## 2012-11-03 DIAGNOSIS — N039 Chronic nephritic syndrome with unspecified morphologic changes: Secondary | ICD-10-CM | POA: Diagnosis not present

## 2012-11-03 DIAGNOSIS — N186 End stage renal disease: Secondary | ICD-10-CM | POA: Diagnosis not present

## 2012-11-03 DIAGNOSIS — D509 Iron deficiency anemia, unspecified: Secondary | ICD-10-CM | POA: Diagnosis not present

## 2012-11-05 DIAGNOSIS — D509 Iron deficiency anemia, unspecified: Secondary | ICD-10-CM | POA: Diagnosis not present

## 2012-11-05 DIAGNOSIS — N039 Chronic nephritic syndrome with unspecified morphologic changes: Secondary | ICD-10-CM | POA: Diagnosis not present

## 2012-11-05 DIAGNOSIS — N2581 Secondary hyperparathyroidism of renal origin: Secondary | ICD-10-CM | POA: Diagnosis not present

## 2012-11-05 DIAGNOSIS — N186 End stage renal disease: Secondary | ICD-10-CM | POA: Diagnosis not present

## 2012-11-07 DIAGNOSIS — D509 Iron deficiency anemia, unspecified: Secondary | ICD-10-CM | POA: Diagnosis not present

## 2012-11-07 DIAGNOSIS — N2581 Secondary hyperparathyroidism of renal origin: Secondary | ICD-10-CM | POA: Diagnosis not present

## 2012-11-07 DIAGNOSIS — N186 End stage renal disease: Secondary | ICD-10-CM | POA: Diagnosis not present

## 2012-11-07 DIAGNOSIS — N039 Chronic nephritic syndrome with unspecified morphologic changes: Secondary | ICD-10-CM | POA: Diagnosis not present

## 2012-11-10 DIAGNOSIS — N186 End stage renal disease: Secondary | ICD-10-CM | POA: Diagnosis not present

## 2012-11-10 DIAGNOSIS — D509 Iron deficiency anemia, unspecified: Secondary | ICD-10-CM | POA: Diagnosis not present

## 2012-11-10 DIAGNOSIS — D631 Anemia in chronic kidney disease: Secondary | ICD-10-CM | POA: Diagnosis not present

## 2012-11-10 DIAGNOSIS — N2581 Secondary hyperparathyroidism of renal origin: Secondary | ICD-10-CM | POA: Diagnosis not present

## 2012-11-12 DIAGNOSIS — N2581 Secondary hyperparathyroidism of renal origin: Secondary | ICD-10-CM | POA: Diagnosis not present

## 2012-11-12 DIAGNOSIS — D509 Iron deficiency anemia, unspecified: Secondary | ICD-10-CM | POA: Diagnosis not present

## 2012-11-12 DIAGNOSIS — N186 End stage renal disease: Secondary | ICD-10-CM | POA: Diagnosis not present

## 2012-11-12 DIAGNOSIS — D631 Anemia in chronic kidney disease: Secondary | ICD-10-CM | POA: Diagnosis not present

## 2012-11-14 DIAGNOSIS — D509 Iron deficiency anemia, unspecified: Secondary | ICD-10-CM | POA: Diagnosis not present

## 2012-11-14 DIAGNOSIS — N2581 Secondary hyperparathyroidism of renal origin: Secondary | ICD-10-CM | POA: Diagnosis not present

## 2012-11-14 DIAGNOSIS — N186 End stage renal disease: Secondary | ICD-10-CM | POA: Diagnosis not present

## 2012-11-14 DIAGNOSIS — N039 Chronic nephritic syndrome with unspecified morphologic changes: Secondary | ICD-10-CM | POA: Diagnosis not present

## 2012-11-17 DIAGNOSIS — N186 End stage renal disease: Secondary | ICD-10-CM | POA: Diagnosis not present

## 2012-11-17 DIAGNOSIS — D509 Iron deficiency anemia, unspecified: Secondary | ICD-10-CM | POA: Diagnosis not present

## 2012-11-17 DIAGNOSIS — D631 Anemia in chronic kidney disease: Secondary | ICD-10-CM | POA: Diagnosis not present

## 2012-11-17 DIAGNOSIS — N2581 Secondary hyperparathyroidism of renal origin: Secondary | ICD-10-CM | POA: Diagnosis not present

## 2012-11-19 DIAGNOSIS — N186 End stage renal disease: Secondary | ICD-10-CM | POA: Diagnosis not present

## 2012-11-19 DIAGNOSIS — E119 Type 2 diabetes mellitus without complications: Secondary | ICD-10-CM | POA: Diagnosis not present

## 2012-11-19 DIAGNOSIS — N039 Chronic nephritic syndrome with unspecified morphologic changes: Secondary | ICD-10-CM | POA: Diagnosis not present

## 2012-11-19 DIAGNOSIS — N2581 Secondary hyperparathyroidism of renal origin: Secondary | ICD-10-CM | POA: Diagnosis not present

## 2012-11-19 DIAGNOSIS — D509 Iron deficiency anemia, unspecified: Secondary | ICD-10-CM | POA: Diagnosis not present

## 2012-11-21 DIAGNOSIS — N2581 Secondary hyperparathyroidism of renal origin: Secondary | ICD-10-CM | POA: Diagnosis not present

## 2012-11-21 DIAGNOSIS — D509 Iron deficiency anemia, unspecified: Secondary | ICD-10-CM | POA: Diagnosis not present

## 2012-11-21 DIAGNOSIS — D631 Anemia in chronic kidney disease: Secondary | ICD-10-CM | POA: Diagnosis not present

## 2012-11-21 DIAGNOSIS — N186 End stage renal disease: Secondary | ICD-10-CM | POA: Diagnosis not present

## 2012-11-24 DIAGNOSIS — D509 Iron deficiency anemia, unspecified: Secondary | ICD-10-CM | POA: Diagnosis not present

## 2012-11-24 DIAGNOSIS — N2581 Secondary hyperparathyroidism of renal origin: Secondary | ICD-10-CM | POA: Diagnosis not present

## 2012-11-24 DIAGNOSIS — N039 Chronic nephritic syndrome with unspecified morphologic changes: Secondary | ICD-10-CM | POA: Diagnosis not present

## 2012-11-24 DIAGNOSIS — N186 End stage renal disease: Secondary | ICD-10-CM | POA: Diagnosis not present

## 2012-11-26 DIAGNOSIS — N2581 Secondary hyperparathyroidism of renal origin: Secondary | ICD-10-CM | POA: Diagnosis not present

## 2012-11-26 DIAGNOSIS — D509 Iron deficiency anemia, unspecified: Secondary | ICD-10-CM | POA: Diagnosis not present

## 2012-11-26 DIAGNOSIS — N186 End stage renal disease: Secondary | ICD-10-CM | POA: Diagnosis not present

## 2012-11-26 DIAGNOSIS — D631 Anemia in chronic kidney disease: Secondary | ICD-10-CM | POA: Diagnosis not present

## 2012-11-28 DIAGNOSIS — D509 Iron deficiency anemia, unspecified: Secondary | ICD-10-CM | POA: Diagnosis not present

## 2012-11-28 DIAGNOSIS — N2581 Secondary hyperparathyroidism of renal origin: Secondary | ICD-10-CM | POA: Diagnosis not present

## 2012-11-28 DIAGNOSIS — N186 End stage renal disease: Secondary | ICD-10-CM | POA: Diagnosis not present

## 2012-11-28 DIAGNOSIS — D631 Anemia in chronic kidney disease: Secondary | ICD-10-CM | POA: Diagnosis not present

## 2012-12-27 DIAGNOSIS — N186 End stage renal disease: Secondary | ICD-10-CM | POA: Diagnosis not present

## 2012-12-29 DIAGNOSIS — N2581 Secondary hyperparathyroidism of renal origin: Secondary | ICD-10-CM | POA: Diagnosis not present

## 2012-12-29 DIAGNOSIS — N186 End stage renal disease: Secondary | ICD-10-CM | POA: Diagnosis not present

## 2012-12-29 DIAGNOSIS — D509 Iron deficiency anemia, unspecified: Secondary | ICD-10-CM | POA: Diagnosis not present

## 2013-01-23 ENCOUNTER — Encounter: Payer: Self-pay | Admitting: Surgery

## 2013-01-26 ENCOUNTER — Ambulatory Visit (INDEPENDENT_AMBULATORY_CARE_PROVIDER_SITE_OTHER): Payer: Medicaid Other | Admitting: Surgery

## 2013-01-26 ENCOUNTER — Encounter (HOSPITAL_COMMUNITY): Payer: Self-pay | Admitting: Pharmacy Technician

## 2013-01-26 ENCOUNTER — Encounter: Payer: Self-pay | Admitting: Surgery

## 2013-01-26 ENCOUNTER — Other Ambulatory Visit: Payer: Self-pay

## 2013-01-26 VITALS — BP 165/96 | HR 80 | Ht 72.0 in | Wt 237.0 lb

## 2013-01-26 DIAGNOSIS — N186 End stage renal disease: Secondary | ICD-10-CM | POA: Diagnosis not present

## 2013-01-26 NOTE — Progress Notes (Signed)
Vascular and Vein Specialist of [facility]   Patient name: Jon [name] MRN: [id number] DOB: [birth year] Sex: male     Chief Complaint  Patient presents with  . Re-evaluation    needs banding, had fistulogram 01/08/13 - HD MWF    HISTORY OF PRESENT ILLNESS: The patient comes back for evaluation of his aneurysmal left upper arm fistula. He was previously seen in the office almost 3 years ago. It appears to have gotten bigger. In addition there are areas of nonhealing on the fistula. He has recently undergone a fistulogram which shows no significant stenosis. He is on dialysis Monday Wednesday Friday. His renal failure secondary to hypertension.  Past Medical History  Diagnosis Date  . Hypertension   . Dialysis patient   . Chronic kidney disease     Past Surgical History  Procedure Laterality Date  . Arteriovenous graft placement    . Small intestine surgery    . Small intestine surgery      History   Social History  . Marital Status: Single    Spouse Name: N/A    Number of Children: 4  . Years of Education: N/A   Occupational History  . DISABILITY    Social History Main Topics  . Smoking status: Never Smoker   . Smokeless tobacco: Never Used  . Alcohol Use: No  . Drug Use: No  . Sexually Active: Not on file   Other Topics Concern  . Not on file   Social History Narrative  . No narrative on file    Family History  Problem Relation Age of Onset  . Hypertension Mother   . Other Mother     varicose veins  . Diabetes Father   . Hypertension Father   . Hypertension Sister   . Other Sister     varicose veins  . Other Brother     varicose veins    Allergies as of 01/26/2013  . (No Known Allergies)    Current Outpatient Prescriptions on File Prior to Visit  Medication Sig Dispense Refill  . losartan (COZAAR) 100 MG tablet Take 100 mg by mouth at bedtime.      . minoxidil (LONITEN) 2.5 MG tablet Take 5 mg by mouth at bedtime.      . multivitamin (RENA-VIT)  TABS tablet Take 1 tablet by mouth at bedtime.      . pantoprazole (PROTONIX) 40 MG tablet Take 1 tablet (40 mg total) by mouth daily.  30 tablet  1  . PHOSLYRA 667 MG/5ML SOLN        No current facility-administered medications on file prior to visit.     REVIEW OF SYSTEMS: Positive for pain in his legs when walking. Otherwise except as mentioned in the history of present illness, all systems are negative as documented by the patient  PHYSICAL EXAMINATION:   Vital signs are BP 165/96  Pulse 80  Ht 6' (1.829 m)  Wt 237 lb (107.502 kg)  BMI 32.14 kg/m2  SpO2 100% General: The patient appears their stated age. HEENT:  No gross abnormalities Pulmonary:  Non labored breathing Musculoskeletal: There are no major deformities. Neurologic: No focal weakness or paresthesias are detected, Skin: There are no ulcer or rashes noted. Psychiatric: The patient has normal affect. Cardiovascular: There is a regular rate and rhythm without significant murmur appreciated. Aneurysmal left upper arm fistula with an area of skin thinning   Diagnostic Studies I have reviewed his outside fistulogram.  It did not show any stenosis  Assessment: Aneurysmal left upper arm fistula Plan: The patient has been scheduled for aneurysmorrhaphy of his left upper arm fistula. I discussed doing this in stages, however we have decided to proceed with a single staged operation. The fistula appears somewhat tortuous in the upper arm, in addition to the 2 large aneurysmal segments. I feel the fistula is going to have to undergo significant shortening to take out the tortuosity within the fistula. I feel this will be best done in a single stage operation. I discussed with the patient that he may require a catheter for dialysis while he is recovering from the surgery. This will depend on how much of the fistula remains undisturbed. I also told him that repairing his aneurysms could cause occlusion of his fistula which would  need a second access this is been scheduled for Thursday, July 10  V. Leia Alf, M.D. [facility] of [place] Office: [phone] Pager:  [phone]

## 2013-01-27 DIAGNOSIS — D509 Iron deficiency anemia, unspecified: Secondary | ICD-10-CM | POA: Diagnosis not present

## 2013-01-27 DIAGNOSIS — N2581 Secondary hyperparathyroidism of renal origin: Secondary | ICD-10-CM | POA: Diagnosis not present

## 2013-01-27 DIAGNOSIS — N186 End stage renal disease: Secondary | ICD-10-CM | POA: Diagnosis not present

## 2013-01-28 DIAGNOSIS — N186 End stage renal disease: Secondary | ICD-10-CM | POA: Diagnosis not present

## 2013-01-28 DIAGNOSIS — N2581 Secondary hyperparathyroidism of renal origin: Secondary | ICD-10-CM | POA: Diagnosis not present

## 2013-01-28 DIAGNOSIS — D509 Iron deficiency anemia, unspecified: Secondary | ICD-10-CM | POA: Diagnosis not present

## 2013-01-30 DIAGNOSIS — N186 End stage renal disease: Secondary | ICD-10-CM | POA: Diagnosis not present

## 2013-01-30 DIAGNOSIS — D509 Iron deficiency anemia, unspecified: Secondary | ICD-10-CM | POA: Diagnosis not present

## 2013-01-30 DIAGNOSIS — N2581 Secondary hyperparathyroidism of renal origin: Secondary | ICD-10-CM | POA: Diagnosis not present

## 2013-02-02 DIAGNOSIS — D509 Iron deficiency anemia, unspecified: Secondary | ICD-10-CM | POA: Diagnosis not present

## 2013-02-02 DIAGNOSIS — N186 End stage renal disease: Secondary | ICD-10-CM | POA: Diagnosis not present

## 2013-02-02 DIAGNOSIS — N2581 Secondary hyperparathyroidism of renal origin: Secondary | ICD-10-CM | POA: Diagnosis not present

## 2013-02-04 ENCOUNTER — Encounter (HOSPITAL_COMMUNITY): Payer: Self-pay | Admitting: *Deleted

## 2013-02-04 DIAGNOSIS — N2581 Secondary hyperparathyroidism of renal origin: Secondary | ICD-10-CM | POA: Diagnosis not present

## 2013-02-04 DIAGNOSIS — D509 Iron deficiency anemia, unspecified: Secondary | ICD-10-CM | POA: Diagnosis not present

## 2013-02-04 DIAGNOSIS — N186 End stage renal disease: Secondary | ICD-10-CM | POA: Diagnosis not present

## 2013-02-04 MED ORDER — CEFUROXIME SODIUM 1.5 G IJ SOLR
1.5000 g | INTRAMUSCULAR | Status: AC
  Administered 2013-02-05: 1.5 g via INTRAVENOUS
  Filled 2013-02-04: qty 1.5

## 2013-02-04 NOTE — Progress Notes (Signed)
Pt states he had  MI maybe 7 years ago in [place].  States he does not see a cardiologist.  Pt denies having any heart surgery or stents placed.  Denies chest pain.  Pt is a poor historian, states he has had an EKG since he moved here but not sure where.

## 2013-02-05 ENCOUNTER — Ambulatory Visit (HOSPITAL_COMMUNITY): Payer: Medicare Other

## 2013-02-05 ENCOUNTER — Ambulatory Visit (HOSPITAL_COMMUNITY): Payer: Medicare Other | Admitting: Anesthesiology

## 2013-02-05 ENCOUNTER — Encounter (HOSPITAL_COMMUNITY): Payer: Self-pay | Admitting: *Deleted

## 2013-02-05 ENCOUNTER — Encounter (HOSPITAL_COMMUNITY)
Admission: RE | Disposition: A | Discharge status: Home / Self Care | Payer: Self-pay | Source: Ambulatory Visit | Attending: Surgery

## 2013-02-05 ENCOUNTER — Encounter (HOSPITAL_COMMUNITY): Payer: Self-pay | Admitting: Anesthesiology

## 2013-02-05 ENCOUNTER — Ambulatory Visit (HOSPITAL_COMMUNITY)
Admission: RE | Admit: 2013-02-05 | Discharge: 2013-02-05 | Disposition: A | Discharge status: Home / Self Care | Payer: Medicare Other | Source: Ambulatory Visit | Attending: Surgery | Admitting: Surgery

## 2013-02-05 DIAGNOSIS — Z992 Dependence on renal dialysis: Secondary | ICD-10-CM | POA: Diagnosis not present

## 2013-02-05 DIAGNOSIS — N186 End stage renal disease: Secondary | ICD-10-CM | POA: Diagnosis not present

## 2013-02-05 DIAGNOSIS — Z79899 Other long term (current) drug therapy: Secondary | ICD-10-CM | POA: Insufficient documentation

## 2013-02-05 DIAGNOSIS — I999 Unspecified disorder of circulatory system: Secondary | ICD-10-CM | POA: Diagnosis not present

## 2013-02-05 DIAGNOSIS — R0989 Other specified symptoms and signs involving the circulatory and respiratory systems: Secondary | ICD-10-CM | POA: Diagnosis not present

## 2013-02-05 DIAGNOSIS — I120 Hypertensive chronic kidney disease with stage 5 chronic kidney disease or end stage renal disease: Secondary | ICD-10-CM | POA: Insufficient documentation

## 2013-02-05 DIAGNOSIS — Y832 Surgical operation with anastomosis, bypass or graft as the cause of abnormal reaction of the patient, or of later complication, without mention of misadventure at the time of the procedure: Secondary | ICD-10-CM | POA: Insufficient documentation

## 2013-02-05 DIAGNOSIS — T82898A Other specified complication of vascular prosthetic devices, implants and grafts, initial encounter: Secondary | ICD-10-CM

## 2013-02-05 DIAGNOSIS — I712 Thoracic aortic aneurysm, without rupture: Secondary | ICD-10-CM | POA: Diagnosis not present

## 2013-02-05 HISTORY — DX: Gastro-esophageal reflux disease without esophagitis: K21.9

## 2013-02-05 HISTORY — DX: Acute myocardial infarction, unspecified: I21.9

## 2013-02-05 HISTORY — PX: RESECTION OF ARTERIOVENOUS FISTULA ANEURYSM: SHX6070

## 2013-02-05 HISTORY — DX: Personal history of other medical treatment: Z92.89

## 2013-02-05 LAB — POCT I-STAT 4, (NA,K, GLUC, HGB,HCT)
Potassium: 4.4 mEq/L (ref 3.5–5.1)
Sodium: 139 mEq/L (ref 135–145)

## 2013-02-05 SURGERY — RESECTION OF ARTERIOVENOUS FISTULA ANEURYSM
Anesthesia: General | Site: Neck | Wound class: Clean

## 2013-02-05 MED ORDER — PROMETHAZINE HCL 25 MG/ML IJ SOLN: 6.2500 mg | INTRAMUSCULAR | Status: DC | PRN

## 2013-02-05 MED ORDER — HEPARIN SODIUM (PORCINE) 1000 UNIT/ML IJ SOLN
INTRAMUSCULAR | Status: DC | PRN
  Administered 2013-02-05: 5000 [IU] via INTRAVENOUS

## 2013-02-05 MED ORDER — OXYCODONE HCL 5 MG PO TABS: 5.0000 mg | ORAL_TABLET | Freq: Once | ORAL | Status: AC | PRN

## 2013-02-05 MED ORDER — 0.9 % SODIUM CHLORIDE (POUR BTL) OPTIME
TOPICAL | Status: DC | PRN
  Administered 2013-02-05: 1000 mL

## 2013-02-05 MED ORDER — OXYCODONE HCL 5 MG PO TABS: 5.0000 mg | ORAL_TABLET | ORAL | Status: DC | PRN

## 2013-02-05 MED ORDER — SODIUM CHLORIDE 0.9 % IV SOLN
INTRAVENOUS | Status: DC
  Administered 2013-02-05 (×2): via INTRAVENOUS

## 2013-02-05 MED ORDER — PROTAMINE SULFATE 10 MG/ML IV SOLN
INTRAVENOUS | Status: DC | PRN
  Administered 2013-02-05: 50 mg via INTRAVENOUS

## 2013-02-05 MED ORDER — LIDOCAINE HCL (CARDIAC) 20 MG/ML IV SOLN
INTRAVENOUS | Status: DC | PRN
  Administered 2013-02-05: 100 mg via INTRAVENOUS

## 2013-02-05 MED ORDER — SODIUM CHLORIDE 0.9 % IR SOLN
Status: DC | PRN
  Administered 2013-02-05: 19:00:00

## 2013-02-05 MED ORDER — MIDAZOLAM HCL 5 MG/5ML IJ SOLN
INTRAMUSCULAR | Status: DC | PRN
  Administered 2013-02-05: 2 mg via INTRAVENOUS

## 2013-02-05 MED ORDER — HYDROMORPHONE HCL PF 1 MG/ML IJ SOLN
INTRAMUSCULAR | Status: AC
  Administered 2013-02-05: 0.25 mg via INTRAVENOUS
  Filled 2013-02-05: qty 1

## 2013-02-05 MED ORDER — OXYCODONE HCL 5 MG/5ML PO SOLN
ORAL | Status: AC
  Administered 2013-02-05: 5 mg via ORAL
  Filled 2013-02-05: qty 5

## 2013-02-05 MED ORDER — FENTANYL CITRATE 0.05 MG/ML IJ SOLN
INTRAMUSCULAR | Status: DC | PRN
  Administered 2013-02-05 (×5): 25 ug via INTRAVENOUS
  Administered 2013-02-05: 50 ug via INTRAVENOUS
  Administered 2013-02-05: 25 ug via INTRAVENOUS

## 2013-02-05 MED ORDER — HEPARIN SODIUM (PORCINE) 1000 UNIT/ML IJ SOLN
INTRAMUSCULAR | Status: AC
  Filled 2013-02-05: qty 1

## 2013-02-05 MED ORDER — LIDOCAINE-EPINEPHRINE (PF) 1 %-1:200000 IJ SOLN
INTRAMUSCULAR | Status: AC
  Filled 2013-02-05: qty 10

## 2013-02-05 MED ORDER — OXYCODONE HCL 5 MG/5ML PO SOLN: 5.0000 mg | Freq: Once | ORAL | Status: AC | PRN

## 2013-02-05 MED ORDER — ONDANSETRON HCL 4 MG/2ML IJ SOLN
INTRAMUSCULAR | Status: DC | PRN
  Administered 2013-02-05: 4 mg via INTRAVENOUS

## 2013-02-05 MED ORDER — PROPOFOL 10 MG/ML IV BOLUS
INTRAVENOUS | Status: DC | PRN
  Administered 2013-02-05: 200 mg via INTRAVENOUS

## 2013-02-05 MED ORDER — HYDROMORPHONE HCL PF 1 MG/ML IJ SOLN
0.2500 mg | INTRAMUSCULAR | Status: DC | PRN
  Administered 2013-02-05: 0.5 mg via INTRAVENOUS
  Administered 2013-02-05: 0.25 mg via INTRAVENOUS

## 2013-02-05 MED ORDER — PHENYLEPHRINE HCL 10 MG/ML IJ SOLN
INTRAMUSCULAR | Status: DC | PRN
  Administered 2013-02-05 (×2): 40 ug via INTRAVENOUS

## 2013-02-05 SURGICAL SUPPLY — 64 items
BAG DECANTER FOR FLEXI CONT (MISCELLANEOUS) IMPLANT
CANISTER SUCTION 2500CC (MISCELLANEOUS) ×3 IMPLANT
CATH CANNON HEMO 15F 50CM (CATHETERS) IMPLANT
CATH CANNON HEMO 15FR 19 (HEMODIALYSIS SUPPLIES) IMPLANT
CATH CANNON HEMO 15FR 23CM (HEMODIALYSIS SUPPLIES) IMPLANT
CATH CANNON HEMO 15FR 31CM (HEMODIALYSIS SUPPLIES) IMPLANT
CATH CANNON HEMO 15FR 32CM (HEMODIALYSIS SUPPLIES) IMPLANT
CATH EMB 4FR 80CM (CATHETERS) ×3 IMPLANT
CLIP TI MEDIUM 6 (CLIP) ×3 IMPLANT
CLIP TI WIDE RED SMALL 6 (CLIP) ×3 IMPLANT
CLOTH BEACON ORANGE TIMEOUT ST (SAFETY) ×3 IMPLANT
CORD ACT VALLEY LAB BOVIE ASPN (MISCELLANEOUS) ×3 IMPLANT
COVER PROBE W GEL 5X96 (DRAPES) ×3 IMPLANT
COVER SURGICAL LIGHT HANDLE (MISCELLANEOUS) ×3 IMPLANT
DERMABOND ADVANCED (GAUZE/BANDAGES/DRESSINGS) ×1
DERMABOND ADVANCED .7 DNX12 (GAUZE/BANDAGES/DRESSINGS) ×2 IMPLANT
DRAPE C-ARM 42X72 X-RAY (DRAPES) IMPLANT
DRAPE CHEST BREAST 15X10 FENES (DRAPES) IMPLANT
DRSG COVADERM 4X14 (GAUZE/BANDAGES/DRESSINGS) ×3 IMPLANT
ELECT BLADE 4.0 EZ CLEAN MEGAD (MISCELLANEOUS) ×3
ELECT REM PT RETURN 9FT ADLT (ELECTROSURGICAL) ×3
ELECTRODE BLDE 4.0 EZ CLN MEGD (MISCELLANEOUS) ×2 IMPLANT
ELECTRODE REM PT RTRN 9FT ADLT (ELECTROSURGICAL) ×2 IMPLANT
GAUZE SPONGE 2X2 8PLY STRL LF (GAUZE/BANDAGES/DRESSINGS) IMPLANT
GAUZE SPONGE 4X4 16PLY XRAY LF (GAUZE/BANDAGES/DRESSINGS) IMPLANT
GLOVE BIO SURGEON STRL SZ 6.5 (GLOVE) ×3 IMPLANT
GLOVE BIOGEL PI IND STRL 6 (GLOVE) ×2 IMPLANT
GLOVE BIOGEL PI IND STRL 7.5 (GLOVE) ×2 IMPLANT
GLOVE BIOGEL PI INDICATOR 6 (GLOVE) ×1
GLOVE BIOGEL PI INDICATOR 7.5 (GLOVE) ×1
GLOVE SURG SS PI 7.0 STRL IVOR (GLOVE) ×3 IMPLANT
GLOVE SURG SS PI 7.5 STRL IVOR (GLOVE) ×3 IMPLANT
GOWN PREVENTION PLUS XXLARGE (GOWN DISPOSABLE) ×3 IMPLANT
GOWN STRL NON-REIN LRG LVL3 (GOWN DISPOSABLE) ×9 IMPLANT
GOWN STRL REIN XL XLG (GOWN DISPOSABLE) ×3 IMPLANT
HEMOSTAT SNOW SURGICEL 2X4 (HEMOSTASIS) IMPLANT
KIT BASIN OR (CUSTOM PROCEDURE TRAY) ×3 IMPLANT
KIT ROOM TURNOVER OR (KITS) ×3 IMPLANT
NEEDLE 18GX1X1/2 (RX/OR ONLY) (NEEDLE) IMPLANT
NEEDLE HYPO 25GX1X1/2 BEV (NEEDLE) IMPLANT
NS IRRIG 1000ML POUR BTL (IV SOLUTION) ×3 IMPLANT
PACK CV ACCESS (CUSTOM PROCEDURE TRAY) ×3 IMPLANT
PACK SURGICAL SETUP 50X90 (CUSTOM PROCEDURE TRAY) IMPLANT
PAD ARMBOARD 7.5X6 YLW CONV (MISCELLANEOUS) ×6 IMPLANT
SOAP 2 % CHG 4 OZ (WOUND CARE) IMPLANT
SPONGE GAUZE 2X2 STER 10/PKG (GAUZE/BANDAGES/DRESSINGS)
SPONGE GAUZE 4X4 12PLY (GAUZE/BANDAGES/DRESSINGS) ×3 IMPLANT
SPONGE LAP 18X18 X RAY DECT (DISPOSABLE) ×3 IMPLANT
SUT ETHILON 3 0 PS 1 (SUTURE) ×3 IMPLANT
SUT PROLENE 5 0 C 1 24 (SUTURE) ×9 IMPLANT
SUT PROLENE 6 0 CC (SUTURE) ×3 IMPLANT
SUT VIC AB 3-0 SH 27 (SUTURE) ×1
SUT VIC AB 3-0 SH 27X BRD (SUTURE) ×2 IMPLANT
SUT VIC AB 3-0 SH 8-18 (SUTURE) ×6 IMPLANT
SUT VICRYL 4-0 PS2 18IN ABS (SUTURE) ×3 IMPLANT
SYR 20CC LL (SYRINGE) IMPLANT
SYR 30ML LL (SYRINGE) IMPLANT
SYR 5ML LL (SYRINGE) ×3 IMPLANT
SYR CONTROL 10ML LL (SYRINGE) IMPLANT
SYRINGE 10CC LL (SYRINGE) IMPLANT
TOWEL OR 17X24 6PK STRL BLUE (TOWEL DISPOSABLE) ×3 IMPLANT
TOWEL OR 17X26 10 PK STRL BLUE (TOWEL DISPOSABLE) ×3 IMPLANT
UNDERPAD 30X30 INCONTINENT (UNDERPADS AND DIAPERS) ×3 IMPLANT
WATER STERILE IRR 1000ML POUR (IV SOLUTION) ×3 IMPLANT

## 2013-02-05 NOTE — Transfer of Care (Signed)
Immediate Anesthesia Transfer of Care Note  Patient: Jon [name]  Procedure(s) Performed: Procedure(s): REPAIR OF ANEURYSM OF LEFT ARM ARTERIOVENOUS FISTULA  (Left)  Patient Location: PACU  Anesthesia Type:General  Level of Consciousness: awake, alert  and oriented  Airway & Oxygen Therapy: Patient Spontanous Breathing and Patient connected to nasal cannula oxygen  Post-op Assessment: Report given to PACU RN and Post -op Vital signs reviewed and stable  Post vital signs: Reviewed and stable  Complications: No apparent anesthesia complications

## 2013-02-05 NOTE — H&P (View-Only) (Signed)
Vascular and Vein Specialist of [facility]   Patient name: Jon [name] MRN: [id number] DOB: 09-01-1955 Sex: male     Chief Complaint  Patient presents with  . Re-evaluation    needs banding, had fistulogram 01/08/13 - HD MWF    HISTORY OF PRESENT ILLNESS: The patient comes back for evaluation of his aneurysmal left upper arm fistula. He was previously seen in the office almost 3 years ago. It appears to have gotten bigger. In addition there are areas of nonhealing on the fistula. He has recently undergone a fistulogram which shows no significant stenosis. He is on dialysis Monday Wednesday Friday. His renal failure secondary to hypertension.  Past Medical History  Diagnosis Date  . Hypertension   . Dialysis patient   . Chronic kidney disease     Past Surgical History  Procedure Laterality Date  . Arteriovenous graft placement    . Small intestine surgery    . Small intestine surgery      History   Social History  . Marital Status: Single    Spouse Name: N/A    Number of Children: 4  . Years of Education: N/A   Occupational History  . DISABILITY    Social History Main Topics  . Smoking status: Never Smoker   . Smokeless tobacco: Never Used  . Alcohol Use: No  . Drug Use: No  . Sexually Active: Not on file   Other Topics Concern  . Not on file   Social History Narrative  . No narrative on file    Family History  Problem Relation Age of Onset  . Hypertension Mother   . Other Mother     varicose veins  . Diabetes Father   . Hypertension Father   . Hypertension Sister   . Other Sister     varicose veins  . Other Brother     varicose veins    Allergies as of 01/26/2013  . (No Known Allergies)    Current Outpatient Prescriptions on File Prior to Visit  Medication Sig Dispense Refill  . losartan (COZAAR) 100 MG tablet Take 100 mg by mouth at bedtime.      . minoxidil (LONITEN) 2.5 MG tablet Take 5 mg by mouth at bedtime.      . multivitamin (RENA-VIT)  TABS tablet Take 1 tablet by mouth at bedtime.      . pantoprazole (PROTONIX) 40 MG tablet Take 1 tablet (40 mg total) by mouth daily.  30 tablet  1  . PHOSLYRA 667 MG/5ML SOLN        No current facility-administered medications on file prior to visit.     REVIEW OF SYSTEMS: Positive for pain in his legs when walking. Otherwise except as mentioned in the history of present illness, all systems are negative as documented by the patient  PHYSICAL EXAMINATION:   Vital signs are BP 165/96  Pulse 80  Ht 6' (1.829 m)  Wt 237 lb (107.502 kg)  BMI 32.14 kg/m2  SpO2 100% General: The patient appears their stated age. HEENT:  No gross abnormalities Pulmonary:  Non labored breathing Musculoskeletal: There are no major deformities. Neurologic: No focal weakness or paresthesias are detected, Skin: There are no ulcer or rashes noted. Psychiatric: The patient has normal affect. Cardiovascular: There is a regular rate and rhythm without significant murmur appreciated. Aneurysmal left upper arm fistula with an area of skin thinning   Diagnostic Studies I have reviewed his outside fistulogram.  It did not show any stenosis  Assessment: Aneurysmal left upper arm fistula Plan: The patient has been scheduled for aneurysmorrhaphy of his left upper arm fistula. I discussed doing this in stages, however we have decided to proceed with a single staged operation. The fistula appears somewhat tortuous in the upper arm, in addition to the 2 large aneurysmal segments. I feel the fistula is going to have to undergo significant shortening to take out the tortuosity within the fistula. I feel this will be best done in a single stage operation. I discussed with the patient that he may require a catheter for dialysis while he is recovering from the surgery. This will depend on how much of the fistula remains undisturbed. I also told him that repairing his aneurysms could cause occlusion of his fistula which would  need a second access this is been scheduled for Thursday, July 10  V. Leia Alf, M.D. [facility] of [place] Office: [phone] Pager:  [phone]

## 2013-02-05 NOTE — Interval H&P Note (Signed)
History and Physical Interval Note:  02/05/2013 11:26 AM  Jon [name]  has presented today for surgery, with the diagnosis of Aneurysm left arm arteriovenous fistula End stage renal disease  The various methods of treatment have been discussed with the patient and family. After consideration of risks, benefits and other options for treatment, the patient has consented to  Procedure(s): REPAIR OF ANEURYSM OF LEFT ARM ARTERIOVENOUS FISTULA  (Left) INSERTION OF DIALYSIS CATHETER (N/A) as a surgical intervention .  The patient's history has been reviewed, patient examined, no change in status, stable for surgery.  I have reviewed the patient's chart and labs.  Questions were answered to the patient's satisfaction.     BRABHAM IV, V. WELLS

## 2013-02-05 NOTE — Op Note (Signed)
Vascular and Vein Specialists of [facility]  Patient name: Jon [name] MRN: [id number] DOB: 02/18/1956 Sex: male  02/05/2013 Pre-operative Diagnosis: Large left upper arm AV fistula aneurysmal degeneration Post-operative diagnosis:  Same Surgeon:  Eldridge Abrahams Assistants:  Wray Kearns Procedure:   1: Resection of AV fistula aneurysm   #2: Plication of left upper arm fistula Anesthesia:  Gen. Blood Loss:  See anesthesia record Specimens:  Fistula aneurysm  Findings:  2 large aneurysmal segment of the fistula were resected. The remaining portion of the fistula was plicated and a end to end anastomosis was performed between the fistula ends  Indications:  This is a 57 year old gentleman his left upper arm fistula has become severely aneurysmal. There are 2 areas with the skin pigment changes. He has undergone a preoperative fistulogram which shows no evidence of stenosis.  Procedure:  The patient was identified in the holding area and taken to [place] [address]  The patient was then placed supine on the table. general anesthesia was administered.  The patient was prepped and draped in the usual sterile fashion.  A time out was called and antibiotics were administered.  A curvilinear incision was made in the upper arm from the antecubital crease three quarters of the way up the arm. Through this incision I dissected out the fistula and the aneurysmal segments. Proximally, the fistula was very tortuous. There were 2 large aneurysmal segments with overlying hypopigmentation and thinning of the skin. I dissected the fistula back to the arteriovenous anastomosis. Once I had the fistula fully exposed the patient was heparinized. I felt the best way to reconstruct this fistula was to resect the 2 aneurysmal segments and then perform an end to end anastomosis of the remaining fistula. The fistula was rather tortuous and so there was enough redundant fistula today will the 2 ends to come together. After  the heparin circulated the fistula was occluded proximally and distally. I initially resected the more proximal fistula. I ended up plicating the more proximal fistula. Approximately 50% of the aneurysmal segment was resected on the distal fistula. The fistula aneurysm where I resected [id number] was plicated with 5-0 Prolene. Approximately 3 cm was plicated. I then performed a end to end anastomosis between the 2 ends of the fistula using 5-0 Prolene. Prior to completion the fistula was flushed. I did pass the 4 Fogarty catheter proximally with no thrombus recovered. The anastomosis was completed and blood flow was reestablished to the fistula. The fistula had an excellent thrill. I then resected the overlying skin and the aneurysmal portion of the proximal fistula. Similarly the more distal fistula, I resected the overlying skin and residual aneurysm. 50 mg of protamine was administered. Once hemostasis was achieved, the subcutaneous tissue was reapproximated with interrupted 3-0 Vicryl and the skin was closed running 4-0 Vicryl. Dermabond and sterile dressings were applied. Patient tolerated procedure well there no complications.   Disposition:  To PACU in stable condition.   Theotis Burrow, M.D. [facility] of [place] Office: [phone] Pager:  [phone]

## 2013-02-05 NOTE — Anesthesia Procedure Notes (Signed)
Procedure Name: LMA Insertion Date/Time: 02/05/2013 6:36 PM Performed by: Melina Copa, Giovoni Bunch R Pre-anesthesia Checklist: Patient identified, Emergency Drugs available, Suction available, Patient being monitored and Timeout performed Patient Re-evaluated:Patient Re-evaluated prior to inductionOxygen Delivery Method: Circle system utilized Preoxygenation: Pre-oxygenation with 100% oxygen Intubation Type: IV induction Ventilation: Mask ventilation without difficulty LMA Size: 5.0 Number of attempts: 1 Placement Confirmation: positive ETCO2 Tube secured with: Tape Dental Injury: Teeth and Oropharynx as per pre-operative assessment

## 2013-02-05 NOTE — Progress Notes (Signed)
Per Dr. Trula Slade patient given a few ice chips.

## 2013-02-05 NOTE — Anesthesia Preprocedure Evaluation (Addendum)
Anesthesia Evaluation  Patient identified by MRN, date of birth, ID band Patient awake    Reviewed: Allergy & Precautions, H&P , NPO status , Patient's Chart, lab work & pertinent test results  Airway Mallampati: I TM Distance: >3 FB Neck ROM: Full    Dental  (+) Missing and Dental Advisory Given   Pulmonary neg pulmonary ROS,    Pulmonary exam normal       Cardiovascular hypertension, + Past MI     Neuro/Psych negative neurological ROS  negative psych ROS   GI/Hepatic Neg liver ROS, GERD-  ,  Endo/Other  negative endocrine ROS  Renal/GU Dialysis and ESRFRenal disease     Musculoskeletal   Abdominal   Peds  Hematology   Anesthesia Other Findings   Reproductive/Obstetrics                           Anesthesia Physical Anesthesia Plan  ASA: III  Anesthesia Plan: General   Post-op Pain Management:    Induction: Intravenous  Airway Management Planned: LMA  Additional Equipment:   Intra-op Plan:   Post-operative Plan: Extubation in OR  Informed Consent: I have reviewed the patients History and Physical, chart, labs and discussed the procedure including the risks, benefits and alternatives for the proposed anesthesia with the patient or authorized representative who has indicated his/her understanding and acceptance.   Dental advisory given  Plan Discussed with: CRNA, Anesthesiologist and Surgeon  Anesthesia Plan Comments:         Anesthesia Quick Evaluation

## 2013-02-05 NOTE — Preoperative (Signed)
Beta Blockers   Reason not to administer Beta Blockers:Not Applicable 

## 2013-02-05 NOTE — Progress Notes (Signed)
Patient has been made aware of delay throughout stay in holding. Patient offered warm blankets. MD has been by to speak to patient.

## 2013-02-06 ENCOUNTER — Encounter (HOSPITAL_COMMUNITY): Payer: Self-pay | Admitting: Surgery

## 2013-02-06 ENCOUNTER — Telehealth: Payer: Self-pay | Admitting: Surgery

## 2013-02-06 DIAGNOSIS — N186 End stage renal disease: Secondary | ICD-10-CM | POA: Diagnosis not present

## 2013-02-06 DIAGNOSIS — D509 Iron deficiency anemia, unspecified: Secondary | ICD-10-CM | POA: Diagnosis not present

## 2013-02-06 DIAGNOSIS — N2581 Secondary hyperparathyroidism of renal origin: Secondary | ICD-10-CM | POA: Diagnosis not present

## 2013-02-06 NOTE — Telephone Encounter (Signed)
Message copied by Berniece Salines on Fri Feb 06, 2013 10:10 AM ------      Message from: Denman George      Created: Fri Feb 06, 2013  9:19 AM                   ----- Message -----         From: Serafina Mitchell, MD         Sent: 02/05/2013   8:48 PM           To: Patrici Ranks, Alfonso Patten, RN            02/05/2013:            Surgeon:  Eldridge Abrahams      Assistants:  Wray Kearns      Procedure:   1: Resection of AV fistula aneurysm        #2: Plication of left upper arm fistula                  Please schedule the patient to followup with me in 2 weeks ------

## 2013-02-06 NOTE — Anesthesia Postprocedure Evaluation (Signed)
Anesthesia Post Note  Patient: Jon [name]  Procedure(s) Performed: Procedure(s) (LRB): REPAIR OF ANEURYSM OF LEFT ARM ARTERIOVENOUS FISTULA  (Left)  Anesthesia type: General  Patient location: PACU  Post pain: Pain level controlled  Post assessment: Patient's Cardiovascular Status Stable  Last Vitals:  Filed Vitals:   02/05/13 2215  BP: 157/72  Pulse: 86  Temp: 36.8 C  Resp: 13    Post vital signs: Reviewed and stable  Level of consciousness: alert  Complications: No apparent anesthesia complications

## 2013-02-06 NOTE — Telephone Encounter (Signed)
Gave patient follow up appointment information - kf

## 2013-02-09 DIAGNOSIS — N186 End stage renal disease: Secondary | ICD-10-CM | POA: Diagnosis not present

## 2013-02-09 DIAGNOSIS — N2581 Secondary hyperparathyroidism of renal origin: Secondary | ICD-10-CM | POA: Diagnosis not present

## 2013-02-09 DIAGNOSIS — D509 Iron deficiency anemia, unspecified: Secondary | ICD-10-CM | POA: Diagnosis not present

## 2013-02-11 DIAGNOSIS — N2581 Secondary hyperparathyroidism of renal origin: Secondary | ICD-10-CM | POA: Diagnosis not present

## 2013-02-11 DIAGNOSIS — D509 Iron deficiency anemia, unspecified: Secondary | ICD-10-CM | POA: Diagnosis not present

## 2013-02-11 DIAGNOSIS — N186 End stage renal disease: Secondary | ICD-10-CM | POA: Diagnosis not present

## 2013-02-13 DIAGNOSIS — N2581 Secondary hyperparathyroidism of renal origin: Secondary | ICD-10-CM | POA: Diagnosis not present

## 2013-02-13 DIAGNOSIS — N186 End stage renal disease: Secondary | ICD-10-CM | POA: Diagnosis not present

## 2013-02-13 DIAGNOSIS — D509 Iron deficiency anemia, unspecified: Secondary | ICD-10-CM | POA: Diagnosis not present

## 2013-02-16 DIAGNOSIS — N186 End stage renal disease: Secondary | ICD-10-CM | POA: Diagnosis not present

## 2013-02-16 DIAGNOSIS — N2581 Secondary hyperparathyroidism of renal origin: Secondary | ICD-10-CM | POA: Diagnosis not present

## 2013-02-16 DIAGNOSIS — D509 Iron deficiency anemia, unspecified: Secondary | ICD-10-CM | POA: Diagnosis not present

## 2013-02-18 DIAGNOSIS — N2581 Secondary hyperparathyroidism of renal origin: Secondary | ICD-10-CM | POA: Diagnosis not present

## 2013-02-18 DIAGNOSIS — N186 End stage renal disease: Secondary | ICD-10-CM | POA: Diagnosis not present

## 2013-02-18 DIAGNOSIS — D509 Iron deficiency anemia, unspecified: Secondary | ICD-10-CM | POA: Diagnosis not present

## 2013-02-18 DIAGNOSIS — E119 Type 2 diabetes mellitus without complications: Secondary | ICD-10-CM | POA: Diagnosis not present

## 2013-02-20 ENCOUNTER — Encounter: Payer: Self-pay | Admitting: Surgery

## 2013-02-20 DIAGNOSIS — N186 End stage renal disease: Secondary | ICD-10-CM | POA: Diagnosis not present

## 2013-02-20 DIAGNOSIS — N2581 Secondary hyperparathyroidism of renal origin: Secondary | ICD-10-CM | POA: Diagnosis not present

## 2013-02-20 DIAGNOSIS — D509 Iron deficiency anemia, unspecified: Secondary | ICD-10-CM | POA: Diagnosis not present

## 2013-02-23 ENCOUNTER — Ambulatory Visit (INDEPENDENT_AMBULATORY_CARE_PROVIDER_SITE_OTHER): Payer: Medicare Other | Admitting: Surgery

## 2013-02-23 ENCOUNTER — Encounter: Payer: Self-pay | Admitting: Surgery

## 2013-02-23 VITALS — BP 164/83 | HR 66 | Resp 16 | Ht 72.0 in | Wt 229.0 lb

## 2013-02-23 DIAGNOSIS — D509 Iron deficiency anemia, unspecified: Secondary | ICD-10-CM | POA: Diagnosis not present

## 2013-02-23 DIAGNOSIS — N186 End stage renal disease: Secondary | ICD-10-CM | POA: Diagnosis not present

## 2013-02-23 DIAGNOSIS — N2581 Secondary hyperparathyroidism of renal origin: Secondary | ICD-10-CM | POA: Diagnosis not present

## 2013-02-23 DIAGNOSIS — M79609 Pain in unspecified limb: Secondary | ICD-10-CM

## 2013-02-23 NOTE — Progress Notes (Signed)
[facility]ists of [place]  Subjective  - Post-op note No signs of steal left upper extremity, and he is on dialysis using the upper portion of the AV fistula.   Objective 164/83 66   16 100% @IOBRIEF @  Palpable thrill left AV fistula Radial pulse palp No numbness in the left hand Incision is healing well  Assessment/Planning: S/P Resection of AV fistula aneurysm and Plication of left upper arm fistula. He can use the distal portion of the AV fistula in 2 weeks.   Follow up as needed in the future.      Laurence Slate [facility] 02/23/2013 11:31 AM -- I agree with the above. The patient may need further resection in the antecubital crease in the  remote future.  Annamarie Major

## 2013-02-25 DIAGNOSIS — D509 Iron deficiency anemia, unspecified: Secondary | ICD-10-CM | POA: Diagnosis not present

## 2013-02-25 DIAGNOSIS — N186 End stage renal disease: Secondary | ICD-10-CM | POA: Diagnosis not present

## 2013-02-25 DIAGNOSIS — N2581 Secondary hyperparathyroidism of renal origin: Secondary | ICD-10-CM | POA: Diagnosis not present

## 2013-02-26 DIAGNOSIS — N186 End stage renal disease: Secondary | ICD-10-CM | POA: Diagnosis not present

## 2013-02-27 DIAGNOSIS — D509 Iron deficiency anemia, unspecified: Secondary | ICD-10-CM | POA: Diagnosis not present

## 2013-02-27 DIAGNOSIS — N186 End stage renal disease: Secondary | ICD-10-CM | POA: Diagnosis not present

## 2013-02-27 DIAGNOSIS — N2581 Secondary hyperparathyroidism of renal origin: Secondary | ICD-10-CM | POA: Diagnosis not present

## 2013-02-27 DIAGNOSIS — E1029 Type 1 diabetes mellitus with other diabetic kidney complication: Secondary | ICD-10-CM | POA: Diagnosis not present

## 2013-03-29 DIAGNOSIS — N186 End stage renal disease: Secondary | ICD-10-CM | POA: Diagnosis not present

## 2013-03-30 DIAGNOSIS — N2581 Secondary hyperparathyroidism of renal origin: Secondary | ICD-10-CM | POA: Diagnosis not present

## 2013-03-30 DIAGNOSIS — Z23 Encounter for immunization: Secondary | ICD-10-CM | POA: Diagnosis not present

## 2013-03-30 DIAGNOSIS — D509 Iron deficiency anemia, unspecified: Secondary | ICD-10-CM | POA: Diagnosis not present

## 2013-03-30 DIAGNOSIS — N186 End stage renal disease: Secondary | ICD-10-CM | POA: Diagnosis not present

## 2013-04-28 DIAGNOSIS — N186 End stage renal disease: Secondary | ICD-10-CM | POA: Diagnosis not present

## 2013-04-29 DIAGNOSIS — N186 End stage renal disease: Secondary | ICD-10-CM | POA: Diagnosis not present

## 2013-04-29 DIAGNOSIS — N2581 Secondary hyperparathyroidism of renal origin: Secondary | ICD-10-CM | POA: Diagnosis not present

## 2013-04-29 DIAGNOSIS — D509 Iron deficiency anemia, unspecified: Secondary | ICD-10-CM | POA: Diagnosis not present

## 2013-05-01 DIAGNOSIS — N186 End stage renal disease: Secondary | ICD-10-CM | POA: Diagnosis not present

## 2013-05-01 DIAGNOSIS — D509 Iron deficiency anemia, unspecified: Secondary | ICD-10-CM | POA: Diagnosis not present

## 2013-05-01 DIAGNOSIS — N2581 Secondary hyperparathyroidism of renal origin: Secondary | ICD-10-CM | POA: Diagnosis not present

## 2013-05-04 DIAGNOSIS — N186 End stage renal disease: Secondary | ICD-10-CM | POA: Diagnosis not present

## 2013-05-04 DIAGNOSIS — N2581 Secondary hyperparathyroidism of renal origin: Secondary | ICD-10-CM | POA: Diagnosis not present

## 2013-05-04 DIAGNOSIS — D509 Iron deficiency anemia, unspecified: Secondary | ICD-10-CM | POA: Diagnosis not present

## 2013-05-06 DIAGNOSIS — N186 End stage renal disease: Secondary | ICD-10-CM | POA: Diagnosis not present

## 2013-05-06 DIAGNOSIS — D509 Iron deficiency anemia, unspecified: Secondary | ICD-10-CM | POA: Diagnosis not present

## 2013-05-06 DIAGNOSIS — N2581 Secondary hyperparathyroidism of renal origin: Secondary | ICD-10-CM | POA: Diagnosis not present

## 2013-05-08 DIAGNOSIS — D509 Iron deficiency anemia, unspecified: Secondary | ICD-10-CM | POA: Diagnosis not present

## 2013-05-08 DIAGNOSIS — N186 End stage renal disease: Secondary | ICD-10-CM | POA: Diagnosis not present

## 2013-05-08 DIAGNOSIS — N2581 Secondary hyperparathyroidism of renal origin: Secondary | ICD-10-CM | POA: Diagnosis not present

## 2013-05-11 DIAGNOSIS — N186 End stage renal disease: Secondary | ICD-10-CM | POA: Diagnosis not present

## 2013-05-11 DIAGNOSIS — D509 Iron deficiency anemia, unspecified: Secondary | ICD-10-CM | POA: Diagnosis not present

## 2013-05-11 DIAGNOSIS — N2581 Secondary hyperparathyroidism of renal origin: Secondary | ICD-10-CM | POA: Diagnosis not present

## 2013-05-13 DIAGNOSIS — D509 Iron deficiency anemia, unspecified: Secondary | ICD-10-CM | POA: Diagnosis not present

## 2013-05-13 DIAGNOSIS — N186 End stage renal disease: Secondary | ICD-10-CM | POA: Diagnosis not present

## 2013-05-13 DIAGNOSIS — N2581 Secondary hyperparathyroidism of renal origin: Secondary | ICD-10-CM | POA: Diagnosis not present

## 2013-05-15 DIAGNOSIS — D509 Iron deficiency anemia, unspecified: Secondary | ICD-10-CM | POA: Diagnosis not present

## 2013-05-15 DIAGNOSIS — N186 End stage renal disease: Secondary | ICD-10-CM | POA: Diagnosis not present

## 2013-05-15 DIAGNOSIS — N2581 Secondary hyperparathyroidism of renal origin: Secondary | ICD-10-CM | POA: Diagnosis not present

## 2013-05-18 DIAGNOSIS — D509 Iron deficiency anemia, unspecified: Secondary | ICD-10-CM | POA: Diagnosis not present

## 2013-05-18 DIAGNOSIS — N186 End stage renal disease: Secondary | ICD-10-CM | POA: Diagnosis not present

## 2013-05-18 DIAGNOSIS — N2581 Secondary hyperparathyroidism of renal origin: Secondary | ICD-10-CM | POA: Diagnosis not present

## 2013-05-20 DIAGNOSIS — E119 Type 2 diabetes mellitus without complications: Secondary | ICD-10-CM | POA: Diagnosis not present

## 2013-05-20 DIAGNOSIS — N186 End stage renal disease: Secondary | ICD-10-CM | POA: Diagnosis not present

## 2013-05-20 DIAGNOSIS — D509 Iron deficiency anemia, unspecified: Secondary | ICD-10-CM | POA: Diagnosis not present

## 2013-05-20 DIAGNOSIS — N2581 Secondary hyperparathyroidism of renal origin: Secondary | ICD-10-CM | POA: Diagnosis not present

## 2013-05-22 DIAGNOSIS — N2581 Secondary hyperparathyroidism of renal origin: Secondary | ICD-10-CM | POA: Diagnosis not present

## 2013-05-22 DIAGNOSIS — N186 End stage renal disease: Secondary | ICD-10-CM | POA: Diagnosis not present

## 2013-05-22 DIAGNOSIS — D509 Iron deficiency anemia, unspecified: Secondary | ICD-10-CM | POA: Diagnosis not present

## 2013-05-25 DIAGNOSIS — D509 Iron deficiency anemia, unspecified: Secondary | ICD-10-CM | POA: Diagnosis not present

## 2013-05-25 DIAGNOSIS — N186 End stage renal disease: Secondary | ICD-10-CM | POA: Diagnosis not present

## 2013-05-25 DIAGNOSIS — N2581 Secondary hyperparathyroidism of renal origin: Secondary | ICD-10-CM | POA: Diagnosis not present

## 2013-05-27 DIAGNOSIS — N2581 Secondary hyperparathyroidism of renal origin: Secondary | ICD-10-CM | POA: Diagnosis not present

## 2013-05-27 DIAGNOSIS — N186 End stage renal disease: Secondary | ICD-10-CM | POA: Diagnosis not present

## 2013-05-27 DIAGNOSIS — D509 Iron deficiency anemia, unspecified: Secondary | ICD-10-CM | POA: Diagnosis not present

## 2013-05-29 ENCOUNTER — Observation Stay (HOSPITAL_COMMUNITY)
Admission: EM | Admit: 2013-05-29 | Discharge: 2013-05-30 | Disposition: A | Discharge status: Home / Self Care | Payer: Medicare Other | Attending: Cardiology | Admitting: Cardiology

## 2013-05-29 ENCOUNTER — Encounter (HOSPITAL_COMMUNITY): Payer: Self-pay | Admitting: Emergency Medicine

## 2013-05-29 ENCOUNTER — Emergency Department (HOSPITAL_COMMUNITY): Payer: Medicare Other

## 2013-05-29 DIAGNOSIS — Z992 Dependence on renal dialysis: Secondary | ICD-10-CM | POA: Diagnosis not present

## 2013-05-29 DIAGNOSIS — D509 Iron deficiency anemia, unspecified: Secondary | ICD-10-CM | POA: Diagnosis not present

## 2013-05-29 DIAGNOSIS — N2581 Secondary hyperparathyroidism of renal origin: Secondary | ICD-10-CM | POA: Diagnosis not present

## 2013-05-29 DIAGNOSIS — N186 End stage renal disease: Secondary | ICD-10-CM | POA: Insufficient documentation

## 2013-05-29 DIAGNOSIS — R079 Chest pain, unspecified: Secondary | ICD-10-CM

## 2013-05-29 DIAGNOSIS — J90 Pleural effusion, not elsewhere classified: Secondary | ICD-10-CM | POA: Diagnosis not present

## 2013-05-29 DIAGNOSIS — R0789 Other chest pain: Principal | ICD-10-CM | POA: Insufficient documentation

## 2013-05-29 DIAGNOSIS — R110 Nausea: Secondary | ICD-10-CM | POA: Insufficient documentation

## 2013-05-29 DIAGNOSIS — Z79899 Other long term (current) drug therapy: Secondary | ICD-10-CM | POA: Insufficient documentation

## 2013-05-29 DIAGNOSIS — I252 Old myocardial infarction: Secondary | ICD-10-CM | POA: Diagnosis not present

## 2013-05-29 DIAGNOSIS — D631 Anemia in chronic kidney disease: Secondary | ICD-10-CM | POA: Diagnosis not present

## 2013-05-29 DIAGNOSIS — K219 Gastro-esophageal reflux disease without esophagitis: Secondary | ICD-10-CM | POA: Diagnosis not present

## 2013-05-29 DIAGNOSIS — I120 Hypertensive chronic kidney disease with stage 5 chronic kidney disease or end stage renal disease: Secondary | ICD-10-CM | POA: Insufficient documentation

## 2013-05-29 DIAGNOSIS — R011 Cardiac murmur, unspecified: Secondary | ICD-10-CM | POA: Diagnosis not present

## 2013-05-29 LAB — CBC WITH DIFFERENTIAL/PLATELET
Basophils Absolute: 0 10*3/uL (ref 0.0–0.1)
Basophils Relative: 0 % (ref 0–1)
Eosinophils Absolute: 0.1 10*3/uL (ref 0.0–0.7)
Eosinophils Relative: 1 % (ref 0–5)
HCT: 38.6 % — ABNORMAL LOW (ref 39.0–52.0)
Hemoglobin: 11.9 g/dL — ABNORMAL LOW (ref 13.0–17.0)
Lymphocytes Relative: 22 % (ref 12–46)
Lymphs Abs: 1.7 10*3/uL (ref 0.7–4.0)
MCHC: 33.9 g/dL (ref 30.0–36.0)
MCV: 90.9 fL (ref 78.0–100.0)
Monocytes Absolute: 0.7 10*3/uL (ref 0.1–1.0)
Monocytes Relative: 7 % (ref 3–12)
Neutro Abs: 6.9 10*3/uL (ref 1.7–7.7)
Neutro Abs: 5.6 10*3/uL (ref 1.7–7.7)
Neutrophils Relative %: 69 % (ref 43–77)
Platelets: 149 10*3/uL — ABNORMAL LOW (ref 150–400)
Platelets: 124 10*3/uL — ABNORMAL LOW (ref 150–400)
RBC: 3.96 MIL/uL — ABNORMAL LOW (ref 4.22–5.81)
RDW: 13.7 % (ref 11.5–15.5)
WBC: 9.8 10*3/uL (ref 4.0–10.5)
WBC: 8.1 10*3/uL (ref 4.0–10.5)

## 2013-05-29 LAB — COMPREHENSIVE METABOLIC PANEL
Albumin: 3.5 g/dL (ref 3.5–5.2)
Alkaline Phosphatase: 41 U/L (ref 39–117)
BUN: 22 mg/dL (ref 6–23)
CO2: 31 mEq/L (ref 19–32)
Calcium: 8.9 mg/dL (ref 8.4–10.5)
Chloride: 96 mEq/L (ref 96–112)
GFR calc Af Amer: 6 mL/min — ABNORMAL LOW (ref >90)
GFR calc non Af Amer: 5 mL/min — ABNORMAL LOW (ref >90)
Glucose, Bld: 119 mg/dL — ABNORMAL HIGH (ref 70–99)
Potassium: 4 mEq/L (ref 3.5–5.1)
Sodium: 137 mEq/L (ref 135–145)
Total Protein: 7.2 g/dL (ref 6.0–8.3)

## 2013-05-29 LAB — BASIC METABOLIC PANEL
Calcium: 8.8 mg/dL (ref 8.4–10.5)
Chloride: 94 mEq/L — ABNORMAL LOW (ref 96–112)
Creatinine, Ser: 8.73 mg/dL — ABNORMAL HIGH (ref 0.50–1.35)
GFR calc Af Amer: 7 mL/min — ABNORMAL LOW (ref >90)
Sodium: 138 mEq/L (ref 135–145)

## 2013-05-29 LAB — PROTIME-INR
INR: 1.13 (ref 0.00–1.49)
Prothrombin Time: 14.3 seconds (ref 11.6–15.2)

## 2013-05-29 LAB — APTT: aPTT: 39 seconds — ABNORMAL HIGH (ref 24–37)

## 2013-05-29 LAB — POCT I-STAT TROPONIN I: Troponin i, poc: 0.09 ng/mL (ref 0.00–0.08)

## 2013-05-29 MED ORDER — ASPIRIN 300 MG RE SUPP
300.0000 mg | RECTAL | Status: DC
  Filled 2013-05-29: qty 1

## 2013-05-29 MED ORDER — CALCIUM ACETATE (PHOS BINDER) 667 MG/5ML PO SOLN
2668.0000 mg | Freq: Three times a day (TID) | ORAL | Status: DC
  Filled 2013-05-29 (×2): qty 20

## 2013-05-29 MED ORDER — ONDANSETRON HCL 4 MG/2ML IJ SOLN: 4.0000 mg | Freq: Four times a day (QID) | INTRAMUSCULAR | Status: DC | PRN

## 2013-05-29 MED ORDER — RENA-VITE PO TABS
1.0000 | ORAL_TABLET | Freq: Every day | ORAL | Status: DC
  Administered 2013-05-29: 1 via ORAL
  Filled 2013-05-29 (×3): qty 1

## 2013-05-29 MED ORDER — PANTOPRAZOLE SODIUM 40 MG PO TBEC
40.0000 mg | DELAYED_RELEASE_TABLET | Freq: Every day | ORAL | Status: DC
  Administered 2013-05-30: 40 mg via ORAL
  Filled 2013-05-29: qty 1

## 2013-05-29 MED ORDER — METOPROLOL TARTRATE 25 MG PO TABS
25.0000 mg | ORAL_TABLET | Freq: Two times a day (BID) | ORAL | Status: DC
  Administered 2013-05-29 – 2013-05-30 (×2): 25 mg via ORAL
  Filled 2013-05-29 (×4): qty 1

## 2013-05-29 MED ORDER — ASPIRIN EC 81 MG PO TBEC
81.0000 mg | DELAYED_RELEASE_TABLET | Freq: Every day | ORAL | Status: DC
  Administered 2013-05-30: 81 mg via ORAL
  Filled 2013-05-29: qty 1

## 2013-05-29 MED ORDER — NITROGLYCERIN IN D5W 200-5 MCG/ML-% IV SOLN: 10.0000 ug/min | INTRAVENOUS | Status: DC

## 2013-05-29 MED ORDER — PANTOPRAZOLE SODIUM 40 MG IV SOLR
40.0000 mg | Freq: Once | INTRAVENOUS | Status: AC
  Administered 2013-05-29: 40 mg via INTRAVENOUS
  Filled 2013-05-29: qty 40

## 2013-05-29 MED ORDER — HEPARIN SODIUM (PORCINE) 5000 UNIT/ML IJ SOLN
5000.0000 [IU] | Freq: Three times a day (TID) | INTRAMUSCULAR | Status: DC
  Administered 2013-05-29 – 2013-05-30 (×3): 5000 [IU] via SUBCUTANEOUS
  Filled 2013-05-29 (×6): qty 1

## 2013-05-29 MED ORDER — CALCIUM ACETATE (PHOS BINDER) 667 MG/5ML PO SOLN
2668.0000 mg | Freq: Three times a day (TID) | ORAL | Status: DC
  Administered 2013-05-30 (×2): 2668 mg via ORAL
  Filled 2013-05-29 (×4): qty 20

## 2013-05-29 MED ORDER — ONDANSETRON HCL 4 MG/2ML IJ SOLN
4.0000 mg | Freq: Once | INTRAMUSCULAR | Status: AC
  Administered 2013-05-29: 4 mg via INTRAVENOUS
  Filled 2013-05-29: qty 2

## 2013-05-29 MED ORDER — LABETALOL HCL 5 MG/ML IV SOLN
20.0000 mg | Freq: Once | INTRAVENOUS | Status: AC
  Administered 2013-05-29: 20 mg via INTRAVENOUS
  Filled 2013-05-29: qty 4

## 2013-05-29 MED ORDER — NITROGLYCERIN IN D5W 200-5 MCG/ML-% IV SOLN
5.0000 ug/min | Freq: Once | INTRAVENOUS | Status: AC
  Administered 2013-05-29: 5 ug/min via INTRAVENOUS
  Filled 2013-05-29: qty 250

## 2013-05-29 MED ORDER — AMLODIPINE BESYLATE 10 MG PO TABS
10.0000 mg | ORAL_TABLET | Freq: Once | ORAL | Status: AC
  Administered 2013-05-29: 10 mg via ORAL
  Filled 2013-05-29: qty 1

## 2013-05-29 MED ORDER — GI COCKTAIL ~~LOC~~
30.0000 mL | Freq: Once | ORAL | Status: AC
  Administered 2013-05-29: 30 mL via ORAL
  Filled 2013-05-29: qty 30

## 2013-05-29 MED ORDER — CALCIUM ACETATE (PHOS BINDER) 667 MG/5ML PO SOLN
667.0000 mg | Freq: Three times a day (TID) | ORAL | Status: DC
  Filled 2013-05-29: qty 20

## 2013-05-29 MED ORDER — AMLODIPINE BESYLATE 5 MG PO TABS
5.0000 mg | ORAL_TABLET | Freq: Every day | ORAL | Status: DC
  Administered 2013-05-30: 5 mg via ORAL
  Filled 2013-05-29: qty 1

## 2013-05-29 MED ORDER — NITROGLYCERIN 0.4 MG SL SUBL: 0.4000 mg | SUBLINGUAL_TABLET | SUBLINGUAL | Status: DC | PRN

## 2013-05-29 MED ORDER — NITROGLYCERIN IN D5W 200-5 MCG/ML-% IV SOLN
20.0000 ug/min | Freq: Once | INTRAVENOUS | Status: AC
  Administered 2013-05-29: 20 ug/min via INTRAVENOUS

## 2013-05-29 MED ORDER — MORPHINE SULFATE 4 MG/ML IJ SOLN
4.0000 mg | INTRAMUSCULAR | Status: AC | PRN
  Administered 2013-05-29 (×3): 4 mg via INTRAVENOUS
  Filled 2013-05-29 (×3): qty 1

## 2013-05-29 MED ORDER — ACETAMINOPHEN 325 MG PO TABS
650.0000 mg | ORAL_TABLET | ORAL | Status: DC | PRN
  Administered 2013-05-30: 650 mg via ORAL
  Filled 2013-05-29: qty 2

## 2013-05-29 MED ORDER — ATORVASTATIN CALCIUM 80 MG PO TABS
80.0000 mg | ORAL_TABLET | Freq: Every day | ORAL | Status: DC
  Administered 2013-05-30: 80 mg via ORAL
  Filled 2013-05-29: qty 1

## 2013-05-29 MED ORDER — ASPIRIN 81 MG PO CHEW: 324.0000 mg | CHEWABLE_TABLET | ORAL | Status: DC

## 2013-05-29 NOTE — ED Notes (Signed)
Pt to department via EMS- pt reports that he was having his dialysis treatment, started having left sided chest pain around 1000. Reports it was sharp nature. 5/10. Pt had 1 nitro 324 asa. Reports no pain relief. Denies any n/v or SOB. Bp-169/101 Hr-84

## 2013-05-29 NOTE — ED Provider Notes (Signed)
CSN: [id number]     Arrival date & time 05/29/13  1121 History   First MD Initiated Contact with Patient 05/29/13 1132     Chief Complaint  Patient presents with  . Chest Pain    HPI  Patient presents via EMS after dialysis. He has a history of hypertensive end-stage renal disease and is on Monday Wednesday Friday hemodialysis. Today was his routine scheduled hemodialysis. Approximately 10 AM he started feeling some sharp pain in the center of his chest. It is associated with nausea. He has not vomited. He was not short of breath. He does not have a cough. He "doesn't know" if It is worse when he breathes. He has fairly frequent heartburn symptoms. He states occasionally feed spicy food that "sticks right here. He put the finger in the area of the center of his chest were he describes his pain. He states he occasionally has to vomited up before it gets better. This has not happened in the last few days. He describes what sounds like an esophageal dilatation for this in the past. When asked about previous heart disease he states he "doesn't know". His chart lists a history of a myocardial infarction with, stating "lived in [place] maybe 7 years ago". He cannot describe any details about this to me he did not have stents he has not had surgeries he does not recall this episode.  I asked him to describe his pain he states "it's a 5". With some prompting he does describe it as sharp. It does not radiate to his neck jaw shoulders back. He does not describe it in his epigastrium.  Past Medical History  Diagnosis Date  . Hypertension   . Dialysis patient   . Chronic kidney disease   . GERD (gastroesophageal reflux disease)   . Myocardial infarction     lived in [place] maybe 7 years ago  . History of blood transfusion    Past Surgical History  Procedure Laterality Date  . Arteriovenous graft placement    . Small intestine surgery    . Small intestine surgery    . Resection of arteriovenous  fistula aneurysm Left 02/05/2013    Procedure: REPAIR OF ANEURYSM OF LEFT ARM ARTERIOVENOUS FISTULA ;  Surgeon: Serafina Mitchell, MD;  Location: [facility] OR;  Service: Vascular;  Laterality: Left;   Family History  Problem Relation Age of Onset  . Hypertension Mother   . Other Mother     varicose veins  . Diabetes Father   . Hypertension Father   . Hypertension Sister   . Other Sister     varicose veins  . Other Brother     varicose veins   History  Substance Use Topics  . Smoking status: Never Smoker   . Smokeless tobacco: Never Used  . Alcohol Use: No    Review of Systems  Constitutional: Negative for fever, chills, diaphoresis, appetite change and fatigue.  HENT: Negative for mouth sores, sore throat and trouble swallowing.   Eyes: Negative for visual disturbance.  Respiratory: Negative for cough, chest tightness, shortness of breath and wheezing.   Cardiovascular: Positive for chest pain. Negative for palpitations and leg swelling.  Gastrointestinal: Positive for nausea. Negative for vomiting, abdominal pain, diarrhea and abdominal distention.  Endocrine: Negative for polydipsia, polyphagia and polyuria.  Genitourinary: Negative for dysuria, frequency and hematuria.  Musculoskeletal: Negative for gait problem.  Skin: Negative for color change, pallor and rash.  Neurological: Negative for dizziness, syncope, light-headedness and headaches.  Hematological:  Does not bruise/bleed easily.  Psychiatric/Behavioral: Negative for behavioral problems and confusion.    Allergies  Review of patient's allergies indicates no known allergies.  Home Medications   Current Outpatient Rx  Name  Route  Sig  Dispense  Refill  . amLODipine (NORVASC) 10 MG tablet   Oral   Take 10 mg by mouth at bedtime.         Marland Kitchen labetalol (NORMODYNE) 200 MG tablet   Oral   Take 200 mg by mouth at bedtime.          Marland Kitchen losartan (COZAAR) 100 MG tablet   Oral   Take 100 mg by mouth at bedtime.          . multivitamin (RENA-VIT) TABS tablet   Oral   Take 1 tablet by mouth at bedtime.         Marland Kitchen PHOSLYRA 667 MG/5ML SOLN   Oral   Take 667-2,668 mg by mouth 3 (three) times daily with meals. Dosage varies depending on size of meal          BP 151/78  Pulse 73  Temp(Src) 98.4 F (36.9 C) (Oral)  Resp 19  Ht 6' (1.829 m)  Wt 232 lb (105.235 kg)  BMI 31.46 kg/m2  SpO2 98% Physical Exam  Constitutional: He is oriented to person, place, and time. He appears well-developed and well-nourished. No distress.  HENT:  Head: Normocephalic.  Eyes: Conjunctivae are normal. Pupils are equal, round, and reactive to light. No scleral icterus.  Neck: Normal range of motion. Neck supple. No thyromegaly present.  Cardiovascular: Normal rate and regular rhythm.  Exam reveals no gallop and no friction rub.   Murmur heard.  Decrescendo systolic murmur is present with a grade of 3/6  The murmur radiates to the carotids.  Pulmonary/Chest: Effort normal and breath sounds normal. No respiratory distress. He has no wheezes. He has no rales.  Clear lungs. No crackles or rales.  Abdominal: Soft. Bowel sounds are normal. He exhibits no distension. There is no tenderness. There is no rebound.  Musculoskeletal: Normal range of motion.  Neurological: He is alert and oriented to person, place, and time.  Skin: Skin is warm and dry. No rash noted.  Psychiatric: He has a normal mood and affect. His behavior is normal.    ED Course  CRITICAL CARE Performed by: Lebron Quam Authorized by: Tanna Furry J Total critical care time: 30 minutes Critical care start time: 05/29/2013 2:00 PM Critical care end time: 05/29/2013 2:25 PM Critical care time was exclusive of separately billable procedures and treating other patients. Critical care was necessary to treat or prevent imminent or life-threatening deterioration of the following conditions: Unstable angina. Critical care was time spent personally by me on the  following activities: blood draw for specimens, discussions with consultants, examination of patient, obtaining history from patient or surrogate, ordering and performing treatments and interventions, ordering and review of laboratory studies, ordering and review of radiographic studies and re-evaluation of patient's condition. Subsequent provider of critical care: I assumed direction of critical care for this patient from another provider of my specialty.   (including critical care time) Labs Review Labs Reviewed  CBC WITH DIFFERENTIAL - Abnormal; Notable for the following:    HCT 38.6 (*)    Platelets 149 (*)    All other components within normal limits  BASIC METABOLIC PANEL - Abnormal; Notable for the following:    Chloride 94 (*)    Creatinine, Ser 8.73 (*)  GFR calc non Af Amer 6 (*)    GFR calc Af Amer 7 (*)    All other components within normal limits  POCT I-STAT TROPONIN I - Abnormal; Notable for the following:    Troponin i, poc 0.09 (*)    All other components within normal limits  POCT I-STAT TROPONIN I - Abnormal; Notable for the following:    Troponin i, poc 0.09 (*)    All other components within normal limits   Imaging Review Dg Chest 2 View  05/29/2013   CLINICAL DATA:  Left-sided chest pain. Hypertension.  EXAM: CHEST  2 VIEW  COMPARISON:  02/05/2013.  FINDINGS: Unchanged cardiomegaly. No pulmonary edema is identified. Tiny bilateral pleural effusions are evident on the lateral view. There is no airspace disease. Mediastinal contours appear unchanged.  IMPRESSION: Cardiomegaly with tiny bilateral pleural effusions.   Electronically Signed   By: Dereck Ligas M.D.   On: 05/29/2013 13:11    EKG Interpretation     Ventricular Rate:  81 PR Interval:  190 QRS Duration: 105 QT Interval:  426 QTC Calculation: 494 R Axis:   -57 Text Interpretation:  Sinus rhythm Probable left atrial enlargement Left anterior fascicular block Abnormal R-wave progression, late  transition LVH  No change vs 02-05-2013            MDM   1. Chest pain    EKG shows no changes. First troponin is normal. No pain relief with several nitroglycerin in route. Minimal relief with IV morphine here. Again the mall relief with IV nitroglycerin here. His pain is atypical. However, he has a history of a cardiac event requiring hospitalization details uncertain, known end-stage renal disease due to hypertensive nephropathy. He has a history of an episode of ischemic bowel requiring surgery. Place a call to receive callback from cardiology on call. Dr. Terrence Dupont he has seen the patient is planning admission. His nitroglycerin was started and titrated to 20 mics with some improvement.    Lebron Quam, MD 05/29/13 [phone]

## 2013-05-29 NOTE — ED Notes (Signed)
Attempted to call report, RN unavailable.

## 2013-05-29 NOTE — Consult Note (Signed)
[facility] Renal Consultation Note  Indication for Consultation:  Management of ESRD/hemodialysis; anemia, hypertension/volume and secondary hyperparathyroidism  HPI: Jon [name] is a 57 y.o. male with ESRD on dialysis on MWF at the [facility] and a history of MI in 2006 who had mid chest pressure and pain today during dialysis, when he was sent to the ED per EMS for evaluation.  He states that his symptoms were associated with nausea and dizziness, but he denies any dyspnea or diaphoresis as he had during his previous MI.  He reports abdominal cramping with diarrhea and chills yesterday, but none today.  His EKG showed normal sinus rhythm with LVH, but no acute ischemic changes, and his troponin was slightly elevated at 0.09.  He completed 3:56 of his 4:15 dialysis today before he was sent to the ED.  Chest x-ray showed tiny bilateral effusions, but no overt edema.  His pain resolved on IV nitroglycerin, and currently he is comfortable, but he will be admitted for evaluation.  Dialysis Orders:  MWF @ [facility] 104 kg    4:15    500/A1.5     2K/2Ca      AVF @ [facility]      Heparin 7000 U    Hectorol 6 mcg     Epogen 0       Venofer 50 mg on Wed  Past Medical History  Diagnosis Date  . Hypertension   . Dialysis patient   . Chronic kidney disease   . GERD (gastroesophageal reflux disease)   . Myocardial infarction     lived in [place] maybe 7 years ago  . History of blood transfusion    Past Surgical History  Procedure Laterality Date  . Arteriovenous graft placement    . Small intestine surgery    . Small intestine surgery    . Resection of arteriovenous fistula aneurysm Left 02/05/2013    Procedure: REPAIR OF ANEURYSM OF LEFT ARM ARTERIOVENOUS FISTULA ;  Surgeon: Serafina Mitchell, MD;  Location: [facility] OR;  Service: Vascular;  Laterality: Left;   Family History  Problem Relation Age of Onset  . Hypertension Mother   . Other Mother     varicose veins  . Diabetes  Father   . Hypertension Father   . Hypertension Sister   . Other Sister     varicose veins  . Other Brother     varicose veins   Social History He denies any history of tobacco, alcohol, or illicit drugs.  In 1982 he moved from [place] to [place], [place] and had his own [place] before requiring dialysis and moving to [place] to stay with his brother, one of 47 siblings.  No Known Allergies Prior to Admission medications   Medication Sig Start Date End Date Taking? Authorizing Provider  amLODipine (NORVASC) 10 MG tablet Take 10 mg by mouth at bedtime.   Yes Historical Provider, MD  labetalol (NORMODYNE) 200 MG tablet Take 200 mg by mouth at bedtime.    Yes Historical Provider, MD  losartan (COZAAR) 100 MG tablet Take 100 mg by mouth at bedtime.   Yes Historical Provider, MD  multivitamin (RENA-VIT) TABS tablet Take 1 tablet by mouth at bedtime.    Historical Provider, MD  [id number] MG/5ML SOLN Take [phone] mg by mouth 3 (three) times daily with meals. Dosage varies depending on size of meal 02/21/12   Historical Provider, MD   Labs:  Results for orders placed during the hospital encounter of 05/29/13 (  from the past 48 hour(s))  CBC WITH DIFFERENTIAL     Status: Abnormal   Collection Time    05/29/13 12:00 PM      Result Value Range   WBC 9.8  4.0 - 10.5 K/uL   RBC 4.31  4.22 - 5.81 MIL/uL   Hemoglobin 13.1  13.0 - 17.0 g/dL   HCT 38.6 (*) 39.0 - 52.0 %   MCV 89.6  78.0 - 100.0 fL   MCH 30.4  26.0 - 34.0 pg   MCHC 33.9  30.0 - 36.0 g/dL   RDW 13.7  11.5 - 15.5 %   Platelets 149 (*) 150 - 400 K/uL   Neutrophils Relative % 70  43 - 77 %   Neutro Abs 6.9  1.7 - 7.7 K/uL   Lymphocytes Relative 21  12 - 46 %   Lymphs Abs 2.1  0.7 - 4.0 K/uL   Monocytes Relative 7  3 - 12 %   Monocytes Absolute 0.7  0.1 - 1.0 K/uL   Eosinophils Relative 1  0 - 5 %   Eosinophils Absolute 0.1  0.0 - 0.7 K/uL   Basophils Relative 0  0 - 1 %   Basophils Absolute 0.0  0.0 - 0.1  K/uL  BASIC METABOLIC PANEL     Status: Abnormal   Collection Time    05/29/13 12:00 PM      Result Value Range   Sodium 138  135 - 145 mEq/L   Potassium 3.8  3.5 - 5.1 mEq/L   Chloride 94 (*) 96 - 112 mEq/L   CO2 32  19 - 32 mEq/L   Glucose, Bld 80  70 - 99 mg/dL   BUN 16  6 - 23 mg/dL   Creatinine, Ser 8.73 (*) 0.50 - 1.35 mg/dL   Calcium 8.8  8.4 - 10.5 mg/dL   GFR calc non Af Amer 6 (*) >90 mL/min   GFR calc Af Amer 7 (*) >90 mL/min   Comment: (NOTE)     The eGFR has been calculated using the CKD EPI equation.     This calculation has not been validated in all clinical situations.     eGFR's persistently <90 mL/min signify possible Chronic Kidney     Disease.  POCT I-STAT TROPONIN I     Status: Abnormal   Collection Time    05/29/13  1:00 PM      Result Value Range   Troponin i, poc 0.09 (*) 0.00 - 0.08 ng/mL   Comment NOTIFIED PHYSICIAN     Comment 3            Comment: Due to the release kinetics of cTnI,     a negative result within the first hours     of the onset of symptoms does not rule out     myocardial infarction with certainty.     If myocardial infarction is still suspected,     repeat the test at appropriate intervals.  POCT I-STAT TROPONIN I     Status: Abnormal   Collection Time    05/29/13  3:29 PM      Result Value Range   Troponin i, poc 0.09 (*) 0.00 - 0.08 ng/mL   Comment NOTIFIED PHYSICIAN     Comment 3            Comment: Due to the release kinetics of cTnI,     a negative result within the first hours     of the onset  of symptoms does not rule out     myocardial infarction with certainty.     If myocardial infarction is still suspected,     repeat the test at appropriate intervals.   Constitutional: negative for , fatigue, fevers and sweats Had chills yest Ears, nose, mouth, throat, and face: negative for earaches, hoarseness, nasal congestion and sore throat Respiratory: negative for cough, dyspnea on exertion, hemoptysis and  sputum Cardiovascular: positive for chest pain, chest pressure, and palpitations, negative for dyspnea, fatigue and orthopnea Gastrointestinal: positive for nausea and resolving diarrhea, negative for abdominal pain and vomiting pain in LUQ yest Genitourinary:negative, oliguric Musculoskeletal:negative for arthralgias, back pain, myalgias and neck pain Neurological: positive for dizziness; negative for gait problems, headaches, speech problems and weakness  Physical Exam: Filed Vitals:   05/29/13 1630  BP: 151/78  Pulse: 73  Temp:   Resp: 19     General appearance: alert, cooperative and no distress Head: Normocephalic, without obvious abnormality, atraumatic Fundi silver wiring.  Upper denture plate Neck: PCL adenopathy, no carotid bruit, no JVD and supple, symmetrical, trachea midline Resp: clear to auscultation bilaterally Cardio: regular rate and rhythm, S1, S2 normal, Gr3/6 M cont hum goes away with comp of LUA AVFt, click, rub or S4 GI: soft, non-tender; bowel sounds normal; no masses,  no organomegaly and midline scar sec to old surgery Liover down 3 cm Extremities: extremities normal, atraumatic, no cyanosis or edema Neurologic: Grossly normal Dialysis Access: AVF @ LUA with + bruit   Assessment/Plan: 1. Atypical chest pain - Hx MI 2006; EKG with no acute findings, Troponin 0.09; abdominal pain with diarrhea yesterday.  Cardiology following, check LFTs. Susp for angina but ? Related to LUQ pain from day prior 2. ESRD - HD on MWF @ [place]; K 3.8 s/p 3:56 of 4:15 Tx today.  Next HD on 11/3.Vol xs 3. Hypertension/volume - BP 151/78 (high post-HD) on outpatient Amlodipine 10 mg qhs; post-HD wt 104.2 kg today with EDW 104, but CXR with small effusions.Lower dry 4. Anemia - Hgb 13.1, no outpatient Epogen; last T-sat 33%, on weekly Venofer. 5. Metabolic bone disease - Ca 8.8, last P 7.4 and iPTH 213 on 10/22; Hectorol 6 mcg, Phoslyra 15 ml with meals. 6. Nutrition - Last Alb 4.4, high  protein renal diet, vitamin. 7. Hx SBO - s/p small bowel resection 06/2010. 8. 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 Trula Slade 02/05/13.  LYLES,CHARLES 05/29/2013, 4:55 PM   Attending Nephrologist: Mauricia Area, MD I have seen and examined this patient and agree with the plan of care seen , eval, discussed and examined. .  Willy Pinkerton L 05/29/2013, 7:18 PM

## 2013-05-29 NOTE — ED Notes (Signed)
EDP at the bedside.  ?

## 2013-05-29 NOTE — Progress Notes (Signed)
Unit CM UR Completed by [facility] ED CM  W. Ottie Neglia RN  

## 2013-05-29 NOTE — ED Notes (Signed)
Pt states Gi cocktail did not help with the pain and just made his throat feel numb. Pain still 5/10

## 2013-05-29 NOTE — ED Notes (Signed)
Results of troponin called to nurse Christina in [place] [facility]

## 2013-05-29 NOTE — H&P (Signed)
Jon [name] is an 57 y.o. male.   Chief Complaint: Chest pain HPI: Patient is 57 year old male with past medical history significant for coronary artery disease history of MI in the past, hypertension, end-stage renal disease on hemodialysis, hypercholesteremia, came to the ER complaining of precordial retrosternal and right-sided chest pain while on hemodialysis off and on described as sharp grade 5/10 associated with nausea and dizziness. Denies any palpitation lightheadedness or syncope. Denies any shortness of breath. Denies relation of chest pain to food breathing or movement. Denies any cough fever or chills. States had the mild heart attack approximately 7 years ago in [place] hour old records not available. Denies any recent cardiac workup. Patient denies any chest pain now patient must at him IV nitroglycerin in ED. EKG showed normal sinus rhythm with LVH no acute ischemic changes were noted troponin I was minimally elevated. to.09.  Past Medical History  Diagnosis Date  . Hypertension   . Dialysis patient   . Chronic kidney disease   . GERD (gastroesophageal reflux disease)   . Myocardial infarction     lived in [place] maybe 7 years ago  . History of blood transfusion     Past Surgical History  Procedure Laterality Date  . Arteriovenous graft placement    . Small intestine surgery    . Small intestine surgery    . Resection of arteriovenous fistula aneurysm Left 02/05/2013    Procedure: REPAIR OF ANEURYSM OF LEFT ARM ARTERIOVENOUS FISTULA ;  Surgeon: Serafina Mitchell, MD;  Location: [facility] OR;  Service: Vascular;  Laterality: Left;    Family History  Problem Relation Age of Onset  . Hypertension Mother   . Other Mother     varicose veins  . Diabetes Father   . Hypertension Father   . Hypertension Sister   . Other Sister     varicose veins  . Other Brother     varicose veins   Social History:  reports that he has never smoked. He has never used smokeless tobacco. He reports  that he does not drink alcohol or use illicit drugs.  Allergies: No Known Allergies   (Not in a hospital admission)  Results for orders placed during the hospital encounter of 05/29/13 (from the past 48 hour(s))  CBC WITH DIFFERENTIAL     Status: Abnormal   Collection Time    05/29/13 12:00 PM      Result Value Range   WBC 9.8  4.0 - 10.5 K/uL   RBC 4.31  4.22 - 5.81 MIL/uL   Hemoglobin 13.1  13.0 - 17.0 g/dL   HCT 38.6 (*) 39.0 - 52.0 %   MCV 89.6  78.0 - 100.0 fL   MCH 30.4  26.0 - 34.0 pg   MCHC 33.9  30.0 - 36.0 g/dL   RDW 13.7  11.5 - 15.5 %   Platelets 149 (*) 150 - 400 K/uL   Neutrophils Relative % 70  43 - 77 %   Neutro Abs 6.9  1.7 - 7.7 K/uL   Lymphocytes Relative 21  12 - 46 %   Lymphs Abs 2.1  0.7 - 4.0 K/uL   Monocytes Relative 7  3 - 12 %   Monocytes Absolute 0.7  0.1 - 1.0 K/uL   Eosinophils Relative 1  0 - 5 %   Eosinophils Absolute 0.1  0.0 - 0.7 K/uL   Basophils Relative 0  0 - 1 %   Basophils Absolute 0.0  0.0 - 0.1 K/uL  BASIC METABOLIC PANEL     Status: Abnormal   Collection Time    05/29/13 12:00 PM      Result Value Range   Sodium 138  135 - 145 mEq/L   Potassium 3.8  3.5 - 5.1 mEq/L   Chloride 94 (*) 96 - 112 mEq/L   CO2 32  19 - 32 mEq/L   Glucose, Bld 80  70 - 99 mg/dL   BUN 16  6 - 23 mg/dL   Creatinine, Ser 8.73 (*) 0.50 - 1.35 mg/dL   Calcium 8.8  8.4 - 10.5 mg/dL   GFR calc non Af Amer 6 (*) >90 mL/min   GFR calc Af Amer 7 (*) >90 mL/min   Comment: (NOTE)     The eGFR has been calculated using the CKD EPI equation.     This calculation has not been validated in all clinical situations.     eGFR's persistently <90 mL/min signify possible Chronic Kidney     Disease.  POCT I-STAT TROPONIN I     Status: Abnormal   Collection Time    05/29/13  1:00 PM      Result Value Range   Troponin i, poc 0.09 (*) 0.00 - 0.08 ng/mL   Comment NOTIFIED PHYSICIAN     Comment 3            Comment: Due to the release kinetics of cTnI,     a negative  result within the first hours     of the onset of symptoms does not rule out     myocardial infarction with certainty.     If myocardial infarction is still suspected,     repeat the test at appropriate intervals.  POCT I-STAT TROPONIN I     Status: Abnormal   Collection Time    05/29/13  3:29 PM      Result Value Range   Troponin i, poc 0.09 (*) 0.00 - 0.08 ng/mL   Comment NOTIFIED PHYSICIAN     Comment 3            Comment: Due to the release kinetics of cTnI,     a negative result within the first hours     of the onset of symptoms does not rule out     myocardial infarction with certainty.     If myocardial infarction is still suspected,     repeat the test at appropriate intervals.   Dg Chest 2 View  05/29/2013   CLINICAL DATA:  Left-sided chest pain. Hypertension.  EXAM: CHEST  2 VIEW  COMPARISON:  02/05/2013.  FINDINGS: Unchanged cardiomegaly. No pulmonary edema is identified. Tiny bilateral pleural effusions are evident on the lateral view. There is no airspace disease. Mediastinal contours appear unchanged.  IMPRESSION: Cardiomegaly with tiny bilateral pleural effusions.   Electronically Signed   By: Dereck Ligas M.D.   On: 05/29/2013 13:11    Review of Systems  Constitutional: Negative for fever and chills.  HENT: Negative for hearing loss.   Cardiovascular: Positive for chest pain and palpitations. Negative for orthopnea, claudication, leg swelling and PND.  Gastrointestinal: Positive for nausea. Negative for vomiting and abdominal pain.  Genitourinary: Negative for dysuria and urgency.  Musculoskeletal: Negative for myalgias.  Neurological: Positive for dizziness. Negative for headaches.    Blood pressure 136/75, pulse 72, temperature 98.4 F (36.9 C), temperature source Oral, resp. rate 14, height 6' (1.829 m), weight 105.235 kg (232 lb), SpO2 96.00%. Physical Exam  Constitutional: He is  oriented to person, place, and time.  HENT:  Head: Normocephalic and  atraumatic.  Eyes: Conjunctivae and EOM are normal. Pupils are equal, round, and reactive to light. Left eye exhibits no discharge.  Neck: Normal range of motion. Neck supple. No JVD present. No tracheal deviation present. No thyromegaly present.  Cardiovascular: Normal rate and regular rhythm.   Murmur (Soft systolic murmur and S4 gallop noted) heard. Respiratory: Effort normal and breath sounds normal. No respiratory distress. He has no wheezes. He has no rales.  GI: Soft. Bowel sounds are normal. He exhibits no distension. There is no tenderness. There is no rebound.  Musculoskeletal: He exhibits no edema and no tenderness.  Neurological: He is alert and oriented to person, place, and time.     Assessment/Plan Atypical chest pain rule out MI Coronary artery disease history of questionable MI in the past Hypertension End-stage renal disease on hemodialysis Hypercholesteremia Plan As per orders  [facility] N 05/29/2013, 4:13 PM

## 2013-05-29 NOTE — ED Notes (Signed)
Patient transported to X-ray 

## 2013-05-29 NOTE — ED Notes (Signed)
Food tray ordered for pt

## 2013-05-29 NOTE — ED Notes (Signed)
Cardiology consult at bedside.

## 2013-05-29 NOTE — ED Notes (Signed)
Spoke to Dr. Jeneen Rinks about pt pain remaining steady at 5/10, was advised to keep Nitro at 64mcgs/min and to not continue titrating drug

## 2013-05-30 ENCOUNTER — Observation Stay (HOSPITAL_COMMUNITY): Payer: Medicare Other

## 2013-05-30 DIAGNOSIS — R079 Chest pain, unspecified: Secondary | ICD-10-CM | POA: Diagnosis not present

## 2013-05-30 DIAGNOSIS — I120 Hypertensive chronic kidney disease with stage 5 chronic kidney disease or end stage renal disease: Secondary | ICD-10-CM | POA: Diagnosis not present

## 2013-05-30 DIAGNOSIS — D631 Anemia in chronic kidney disease: Secondary | ICD-10-CM | POA: Diagnosis not present

## 2013-05-30 DIAGNOSIS — N186 End stage renal disease: Secondary | ICD-10-CM | POA: Diagnosis not present

## 2013-05-30 LAB — BASIC METABOLIC PANEL
BUN: 27 mg/dL — ABNORMAL HIGH (ref 6–23)
CO2: 24 mEq/L (ref 19–32)
Chloride: 94 mEq/L — ABNORMAL LOW (ref 96–112)
Creatinine, Ser: 12.25 mg/dL — ABNORMAL HIGH (ref 0.50–1.35)
Glucose, Bld: 76 mg/dL (ref 70–99)
Potassium: 4.6 mEq/L (ref 3.5–5.1)

## 2013-05-30 LAB — CBC
HCT: 35.3 % — ABNORMAL LOW (ref 39.0–52.0)
Hemoglobin: 11.5 g/dL — ABNORMAL LOW (ref 13.0–17.0)
MCH: 29.5 pg (ref 26.0–34.0)
MCH: 30.2 pg (ref 26.0–34.0)
MCV: 90.5 fL (ref 78.0–100.0)
MCV: 90.7 fL (ref 78.0–100.0)
Platelets: 129 10*3/uL — ABNORMAL LOW (ref 150–400)
RBC: 3.87 MIL/uL — ABNORMAL LOW (ref 4.22–5.81)
RDW: 14.1 % (ref 11.5–15.5)
RDW: 13.9 % (ref 11.5–15.5)
WBC: 9.4 10*3/uL (ref 4.0–10.5)
WBC: 7.9 10*3/uL (ref 4.0–10.5)

## 2013-05-30 LAB — LIPID PANEL
HDL: 35 mg/dL — ABNORMAL LOW (ref >39)
LDL Cholesterol: 82 mg/dL (ref 0–99)
Total CHOL/HDL Ratio: 4 RATIO
VLDL: 22 mg/dL (ref 0–40)

## 2013-05-30 MED ORDER — ACETAMINOPHEN 325 MG PO TABS: 650.0000 mg | ORAL_TABLET | Freq: Four times a day (QID) | ORAL | Status: DC | PRN

## 2013-05-30 MED ORDER — PANTOPRAZOLE SODIUM 40 MG PO TBEC: 40.0000 mg | DELAYED_RELEASE_TABLET | Freq: Every day | ORAL | Status: DC

## 2013-05-30 MED ORDER — ASPIRIN 81 MG PO TBEC: 81.0000 mg | DELAYED_RELEASE_TABLET | Freq: Every day | ORAL | Status: DC

## 2013-05-30 MED ORDER — TECHNETIUM TC 99M SESTAMIBI - CARDIOLITE
30.0000 | Freq: Once | INTRAVENOUS | Status: AC | PRN
  Administered 2013-05-30: 10:00:00 30 via INTRAVENOUS

## 2013-05-30 MED ORDER — REGADENOSON 0.4 MG/5ML IV SOLN
INTRAVENOUS | Status: AC
  Administered 2013-05-30: 0.4 mg
  Filled 2013-05-30: qty 5

## 2013-05-30 MED ORDER — TECHNETIUM TC 99M SESTAMIBI GENERIC - CARDIOLITE
10.0000 | Freq: Once | INTRAVENOUS | Status: AC | PRN
  Administered 2013-05-30: 10 via INTRAVENOUS

## 2013-05-30 MED ORDER — ATORVASTATIN CALCIUM 20 MG PO TABS: 20.0000 mg | ORAL_TABLET | Freq: Every day | ORAL | Status: DC

## 2013-05-30 MED ORDER — NITROGLYCERIN 0.4 MG SL SUBL: 0.4000 mg | SUBLINGUAL_TABLET | SUBLINGUAL | Status: DC | PRN

## 2013-05-30 NOTE — Discharge Summary (Signed)
  Discharge summary dictated on 05/30/2013 dictation number is [id number]

## 2013-05-30 NOTE — Progress Notes (Signed)
Subjective: Interval History: has complaints LUQ pain, different than CP, similar to what he had 2 d ago. No D.  Objective: Vital signs in last 24 hours: Temp:  [98.2 F (36.8 C)-99.1 F (37.3 C)] 98.2 F (36.8 C) (11/01 0000) Pulse Rate:  [69-105] 105 (11/01 0400) Resp:  [13-19] 18 (11/01 0000) BP: (132-179)/(66-98) 159/93 mmHg (11/01 0400) SpO2:  [93 %-100 %] 96 % (11/01 0400) Weight:  [105.235 kg (232 lb)] 105.235 kg (232 lb) (10/31 1129) Weight change:   Intake/Output from previous day:   Intake/Output this shift:    General appearance: alert, cooperative and appears stated age Resp: diminished breath sounds LLL Cardio: regular rate and rhythm, S1, S2 normal, S4 present and systolic murmur: systolic ejection 2/6, blowing at 2nd left intercostal space GI: pos bs,soft, tender LUQ Extremities: AVF LUA  Lab Results:  Recent Labs  05/29/13 2155 05/30/13 0614  WBC 8.1 9.4  HGB 11.9* 11.5*  HCT 36.0* 35.3*  PLT 124* 137*   BMET:  Recent Labs  05/29/13 1200 05/29/13 2155  NA 138 137  K 3.8 4.0  CL 94* 96  CO2 32 31  GLUCOSE 80 119*  BUN 16 22  CREATININE 8.73* 10.40*  CALCIUM 8.8 8.9   No results found for this basename: PTH,  in the last 72 hours Iron Studies: No results found for this basename: IRON, TIBC, TRANSFERRIN, FERRITIN,  in the last 72 hours  Studies/Results: Dg Chest 2 View  05/29/2013   CLINICAL DATA:  Left-sided chest pain. Hypertension.  EXAM: CHEST  2 VIEW  COMPARISON:  02/05/2013.  FINDINGS: Unchanged cardiomegaly. No pulmonary edema is identified. Tiny bilateral pleural effusions are evident on the lateral view. There is no airspace disease. Mediastinal contours appear unchanged.  IMPRESSION: Cardiomegaly with tiny bilateral pleural effusions.   Electronically Signed   By: Dereck Ligas M.D.   On: 05/29/2013 13:11    I have reviewed the patient's current medications.  Assessment/Plan: 1  CRF mild vol xs 2 Anemia stable 3 HTN some vol  xs, cont meds 4 CP per cards 5 LUQ pain check CBC P HD Mon, check CBC,, BP meds    LOS: 1 day   Edye Hainline L 05/30/2013,7:19 AM

## 2013-05-30 NOTE — Progress Notes (Signed)
Subjective:  Patient denies any further chest pains or abdominal pain. 2 sets of troponin are negative. Point of care troponin I is minimally elevated  Objective:  Vital Signs in the last 24 hours: Temp:  [98.2 F (36.8 C)-99.1 F (37.3 C)] 98.4 F (36.9 C) (11/01 0748) Pulse Rate:  [69-105] 88 (11/01 0927) Resp:  [13-19] 16 (11/01 0748) BP: (132-179)/(66-98) 161/75 mmHg (11/01 0927) SpO2:  [93 %-100 %] 97 % (11/01 0748) Weight:  [105.235 kg (232 lb)] 105.235 kg (232 lb) (10/31 1129)  Intake/Output from previous day:   Intake/Output from this shift:    Physical Exam: Neck: no adenopathy, no carotid bruit, no JVD and supple, symmetrical, trachea midline Lungs: clear to auscultation bilaterally Heart: regular rate and rhythm and Soft systolic murmur and S4 gallop noted Abdomen: soft, non-tender; bowel sounds normal; no masses,  no organomegaly Extremities: extremities normal, atraumatic, no cyanosis or edema  Lab Results:  Recent Labs  05/29/13 2155 05/30/13 0614  WBC 8.1 9.4  HGB 11.9* 11.5*  PLT 124* 137*    Recent Labs  05/29/13 2155 05/30/13 0614  NA 137 135  K 4.0 4.6  CL 96 94*  CO2 31 24  GLUCOSE 119* 76  BUN 22 27*  CREATININE 10.40* 12.25*    Recent Labs  05/30/13 0032 05/30/13 0614  TROPONINI <0.30 <0.30   Hepatic Function Panel  Recent Labs  05/29/13 2155  PROT 7.2  ALBUMIN 3.5  AST 24  ALT 15  ALKPHOS 41  BILITOT 0.5    Recent Labs  05/30/13 0614  CHOL 139   No results found for this basename: PROTIME,  in the last 72 hours  Imaging: Imaging results have been reviewed and Dg Chest 2 View  05/29/2013   CLINICAL DATA:  Left-sided chest pain. Hypertension.  EXAM: CHEST  2 VIEW  COMPARISON:  02/05/2013.  FINDINGS: Unchanged cardiomegaly. No pulmonary edema is identified. Tiny bilateral pleural effusions are evident on the lateral view. There is no airspace disease. Mediastinal contours appear unchanged.  IMPRESSION: Cardiomegaly  with tiny bilateral pleural effusions.   Electronically Signed   By: Dereck Ligas M.D.   On: 05/29/2013 13:11    Cardiac Studies:  Assessment/Plan:  Status post Atypical chest pain MI ruled out Coronary artery disease history of questionable MI in the past  Hypertension  End-stage renal disease on hemodialysis  Anemia of chronic disease Hypercholesteremia Glucose intolerance GERD Plan  Scheduled for nuclear stress test today. If negative for ischemia will DC home and will restart his hemodialysis Monday Wednesday and Friday on his scheduled routine  LOS: 1 day    Virgal Warmuth N 05/30/2013, 10:15 AM

## 2013-05-31 NOTE — Discharge Summary (Signed)
[name], Jon [name]                 ACCOUNT NO.:  [id number]  MEDICAL RECORD NO.:  [id number]  LOCATION:  3W34C                        FACILITY:  [facility]  PHYSICIAN:  Shirley Bolle Jon [name], M.D. DATE OF BIRTH:  09-Oct-1955  DATE OF ADMISSION:  05/29/2013 DATE OF DISCHARGE:  05/30/2013                              DISCHARGE SUMMARY   ADMITTING DIAGNOSES: 1. Atypical chest pain, rule out myocardial infarction, coronary     artery disease, history of questionable myocardial infarction in     the past. 2. Hypertension. 3. End-stage renal disease, on hemodialysis. 4. Hypercholesteremia. 5. Glucose intolerance.  DISCHARGE DIAGNOSES: 1. Status post atypical chest pain, myocardial infarction ruled out,     negative nuclear stress test, asymptomatic left ventricular     systolic dysfunction. 2. Coronary artery disease, history of questionable myocardial     infarction in the past. 3. Hypertension. 4. End-stage renal disease, on hemodialysis. 5. Anemia of chronic disease. 6. Hypercholesteremia. 7. Gastroesophageal reflux disease. 8. Glucose intolerance.  DISCHARGE HOME MEDICATIONS: 1. Aspirin 81 mg 1 tablet daily. 2. Nitrostat 0.4 mg sublingual, use as directed. 3. Protonix 40 mg 1 tablet daily. 4. Amlodipine 10 mg 1 tablet daily. 5. Labetalol 200 mg at night as before. 6. Losartan 100 mg 1 tablet daily. 7. Multivitamin 1 tablet daily. 8. Calcium acetate 667 mg 3 times daily as before. 9. Lipitor 20 mg 1 tablet daily.  DIET:  Low salt, low cholesterol, 1800 calories, ADA/renal diet.  ACTIVITY:  As tolerated.  FOLLOWUP:  Follow up with me in 1 week.  The patient will continue his scheduled hemodialysis on Monday, Wednesday, Friday as scheduled.  CONDITION AT DISCHARGE:  Stable.  BRIEF HISTORY AND HOSPITAL COURSE:  [name] is a 57 year old male with past medical history significant for coronary artery disease; history of MI in the past; hypertension; end-stage renal disease,  on hemodialysis; hypercholesteremia, he came to the ER, complaining of precordial, retrosternal and right-sided chest pain.  While hemodialysis machine off and on, described as sharp, grade 5 or 10 associated with nausea and dizziness.  The patient denies any palpitation, lightheadedness, or syncope.  Denies any shortness of breath.  Denies any relation of chest pain to food, breathing, or movement.  Denies any cough, fever, chills.  The patient states he had mild heart attack approximately 7 years ago in [place].  Old records are not available. Denies any recent cardiac workup.  The patient denies any chest pain. EKG done in the ER showed normal sinus rhythm with LVH with no acute ischemic changes are noted.  Troponin-I, first set, point of care was minimally elevated to 0.09.  PAST MEDICAL HISTORY:  As above.  PHYSICAL EXAMINATION:  GENERAL:  He was alert, awake, oriented x3, in no acute distress. VITAL SIGNS:  Blood pressure was 136/75, pulse was 72.  He was afebrile. HEENT:  Conjunctivae was pink. NECK:  Supple.  No JVD.  No bruit. LUNGS:  Clear to auscultation without rhonchi or rales. CARDIOVASCULAR:  S1, S2 was normal.  There was soft systolic murmur and S4 gallop.  There was no pericardial rub. ABDOMEN:  Soft.  Bowel sounds were present.  Nontender. EXTREMITIES:  There  was no clubbing, cyanosis, or edema.  He had graft in the left upper arm with positive bruit.  LABORATORY DATA:  Sodium was 138, potassium 3.8, BUN 16, creatinine 8.73.  His glucose was 80, repeat blood sugar was 119.  Repeat fasting sugar this morning is 76.  Point of care troponin-I 0.09, repeat was same 0.09.  By labs, three sets of troponin-I were negative less than 0.30.  His cholesterol total was 139, triglycerides 108, HDL was low 35, LDL 82.  Hemoglobin was 13.1, hematocrit 38.6, white count of 9.8. Repeat hemoglobin 11.9, hematocrit 36, white count of 8.1.  This morning, hemoglobin is 11.7,  hematocrit 35.1, which is stable, white count of 7.9.  Lexiscan Myoview showed no evidence of inducible ischemia, dilated left ventricle with mild hypokinesia, EF of 46%.  BRIEF HOSPITAL COURSE:  The patient was admitted to Telemetry Unit.  MI was ruled out by serial enzymes and EKG.  The patient subsequently underwent Lexiscan Myoview today, which showed no evidence of reversible ischemia with mildly dilated LV and global hypokinesia with EF of 46%. The patient did not have any further episodes of anginal chest pain during the hospital stay.  The patient is ambulating in the hallway without any problems.  The patient will be discharged home on above medications and will be followed up in my office in 1 week and will continue his hemodialysis and follow up with [facility] as scheduled.     [name]. Terrence [name], M.D.     MNH/MEDQ  D:  05/30/2013  T:  05/31/2013  Job:  [id number]

## 2013-06-01 DIAGNOSIS — D509 Iron deficiency anemia, unspecified: Secondary | ICD-10-CM | POA: Diagnosis not present

## 2013-06-01 DIAGNOSIS — N186 End stage renal disease: Secondary | ICD-10-CM | POA: Diagnosis not present

## 2013-06-01 DIAGNOSIS — N2581 Secondary hyperparathyroidism of renal origin: Secondary | ICD-10-CM | POA: Diagnosis not present

## 2013-06-03 DIAGNOSIS — D509 Iron deficiency anemia, unspecified: Secondary | ICD-10-CM | POA: Diagnosis not present

## 2013-06-03 DIAGNOSIS — N186 End stage renal disease: Secondary | ICD-10-CM | POA: Diagnosis not present

## 2013-06-03 DIAGNOSIS — N2581 Secondary hyperparathyroidism of renal origin: Secondary | ICD-10-CM | POA: Diagnosis not present

## 2013-06-05 DIAGNOSIS — D509 Iron deficiency anemia, unspecified: Secondary | ICD-10-CM | POA: Diagnosis not present

## 2013-06-05 DIAGNOSIS — N2581 Secondary hyperparathyroidism of renal origin: Secondary | ICD-10-CM | POA: Diagnosis not present

## 2013-06-05 DIAGNOSIS — N186 End stage renal disease: Secondary | ICD-10-CM | POA: Diagnosis not present

## 2013-06-08 DIAGNOSIS — N2581 Secondary hyperparathyroidism of renal origin: Secondary | ICD-10-CM | POA: Diagnosis not present

## 2013-06-08 DIAGNOSIS — N186 End stage renal disease: Secondary | ICD-10-CM | POA: Diagnosis not present

## 2013-06-08 DIAGNOSIS — D509 Iron deficiency anemia, unspecified: Secondary | ICD-10-CM | POA: Diagnosis not present

## 2013-06-10 DIAGNOSIS — N186 End stage renal disease: Secondary | ICD-10-CM | POA: Diagnosis not present

## 2013-06-10 DIAGNOSIS — N2581 Secondary hyperparathyroidism of renal origin: Secondary | ICD-10-CM | POA: Diagnosis not present

## 2013-06-10 DIAGNOSIS — D509 Iron deficiency anemia, unspecified: Secondary | ICD-10-CM | POA: Diagnosis not present

## 2013-06-12 DIAGNOSIS — N186 End stage renal disease: Secondary | ICD-10-CM | POA: Diagnosis not present

## 2013-06-12 DIAGNOSIS — D509 Iron deficiency anemia, unspecified: Secondary | ICD-10-CM | POA: Diagnosis not present

## 2013-06-12 DIAGNOSIS — N2581 Secondary hyperparathyroidism of renal origin: Secondary | ICD-10-CM | POA: Diagnosis not present

## 2013-06-15 DIAGNOSIS — N186 End stage renal disease: Secondary | ICD-10-CM | POA: Diagnosis not present

## 2013-06-15 DIAGNOSIS — N2581 Secondary hyperparathyroidism of renal origin: Secondary | ICD-10-CM | POA: Diagnosis not present

## 2013-06-15 DIAGNOSIS — D509 Iron deficiency anemia, unspecified: Secondary | ICD-10-CM | POA: Diagnosis not present

## 2013-06-17 DIAGNOSIS — N2581 Secondary hyperparathyroidism of renal origin: Secondary | ICD-10-CM | POA: Diagnosis not present

## 2013-06-17 DIAGNOSIS — D509 Iron deficiency anemia, unspecified: Secondary | ICD-10-CM | POA: Diagnosis not present

## 2013-06-17 DIAGNOSIS — N186 End stage renal disease: Secondary | ICD-10-CM | POA: Diagnosis not present

## 2013-06-19 DIAGNOSIS — N2581 Secondary hyperparathyroidism of renal origin: Secondary | ICD-10-CM | POA: Diagnosis not present

## 2013-06-19 DIAGNOSIS — N186 End stage renal disease: Secondary | ICD-10-CM | POA: Diagnosis not present

## 2013-06-19 DIAGNOSIS — D509 Iron deficiency anemia, unspecified: Secondary | ICD-10-CM | POA: Diagnosis not present

## 2013-06-22 DIAGNOSIS — N2581 Secondary hyperparathyroidism of renal origin: Secondary | ICD-10-CM | POA: Diagnosis not present

## 2013-06-22 DIAGNOSIS — D509 Iron deficiency anemia, unspecified: Secondary | ICD-10-CM | POA: Diagnosis not present

## 2013-06-22 DIAGNOSIS — N186 End stage renal disease: Secondary | ICD-10-CM | POA: Diagnosis not present

## 2013-06-24 DIAGNOSIS — D509 Iron deficiency anemia, unspecified: Secondary | ICD-10-CM | POA: Diagnosis not present

## 2013-06-24 DIAGNOSIS — N2581 Secondary hyperparathyroidism of renal origin: Secondary | ICD-10-CM | POA: Diagnosis not present

## 2013-06-24 DIAGNOSIS — N186 End stage renal disease: Secondary | ICD-10-CM | POA: Diagnosis not present

## 2013-06-26 DIAGNOSIS — N186 End stage renal disease: Secondary | ICD-10-CM | POA: Diagnosis not present

## 2013-06-26 DIAGNOSIS — N2581 Secondary hyperparathyroidism of renal origin: Secondary | ICD-10-CM | POA: Diagnosis not present

## 2013-06-26 DIAGNOSIS — D509 Iron deficiency anemia, unspecified: Secondary | ICD-10-CM | POA: Diagnosis not present

## 2013-06-29 DIAGNOSIS — D509 Iron deficiency anemia, unspecified: Secondary | ICD-10-CM | POA: Diagnosis not present

## 2013-06-29 DIAGNOSIS — N2581 Secondary hyperparathyroidism of renal origin: Secondary | ICD-10-CM | POA: Diagnosis not present

## 2013-06-29 DIAGNOSIS — N186 End stage renal disease: Secondary | ICD-10-CM | POA: Diagnosis not present

## 2013-07-29 DIAGNOSIS — N186 End stage renal disease: Secondary | ICD-10-CM | POA: Diagnosis not present

## 2013-07-31 DIAGNOSIS — Z992 Dependence on renal dialysis: Secondary | ICD-10-CM | POA: Diagnosis not present

## 2013-07-31 DIAGNOSIS — D631 Anemia in chronic kidney disease: Secondary | ICD-10-CM | POA: Diagnosis not present

## 2013-07-31 DIAGNOSIS — D509 Iron deficiency anemia, unspecified: Secondary | ICD-10-CM | POA: Diagnosis not present

## 2013-07-31 DIAGNOSIS — N2581 Secondary hyperparathyroidism of renal origin: Secondary | ICD-10-CM | POA: Diagnosis not present

## 2013-07-31 DIAGNOSIS — N186 End stage renal disease: Secondary | ICD-10-CM | POA: Diagnosis not present

## 2013-08-26 DIAGNOSIS — E119 Type 2 diabetes mellitus without complications: Secondary | ICD-10-CM | POA: Diagnosis not present

## 2013-08-29 DIAGNOSIS — N186 End stage renal disease: Secondary | ICD-10-CM | POA: Diagnosis not present

## 2013-08-31 DIAGNOSIS — N186 End stage renal disease: Secondary | ICD-10-CM | POA: Diagnosis not present

## 2013-08-31 DIAGNOSIS — D509 Iron deficiency anemia, unspecified: Secondary | ICD-10-CM | POA: Diagnosis not present

## 2013-08-31 DIAGNOSIS — N2581 Secondary hyperparathyroidism of renal origin: Secondary | ICD-10-CM | POA: Diagnosis not present

## 2013-09-09 DIAGNOSIS — I10 Essential (primary) hypertension: Secondary | ICD-10-CM | POA: Diagnosis not present

## 2013-09-09 DIAGNOSIS — R0989 Other specified symptoms and signs involving the circulatory and respiratory systems: Secondary | ICD-10-CM | POA: Diagnosis not present

## 2013-09-09 DIAGNOSIS — E7800 Pure hypercholesterolemia, unspecified: Secondary | ICD-10-CM | POA: Diagnosis not present

## 2013-09-09 DIAGNOSIS — R0609 Other forms of dyspnea: Secondary | ICD-10-CM | POA: Diagnosis not present

## 2013-09-17 DIAGNOSIS — I10 Essential (primary) hypertension: Secondary | ICD-10-CM | POA: Diagnosis not present

## 2013-09-17 DIAGNOSIS — E7800 Pure hypercholesterolemia, unspecified: Secondary | ICD-10-CM | POA: Diagnosis not present

## 2013-09-17 DIAGNOSIS — R0609 Other forms of dyspnea: Secondary | ICD-10-CM | POA: Diagnosis not present

## 2013-09-26 DIAGNOSIS — N186 End stage renal disease: Secondary | ICD-10-CM | POA: Diagnosis not present

## 2013-09-28 DIAGNOSIS — D509 Iron deficiency anemia, unspecified: Secondary | ICD-10-CM | POA: Diagnosis not present

## 2013-09-28 DIAGNOSIS — N2581 Secondary hyperparathyroidism of renal origin: Secondary | ICD-10-CM | POA: Diagnosis not present

## 2013-09-28 DIAGNOSIS — N186 End stage renal disease: Secondary | ICD-10-CM | POA: Diagnosis not present

## 2013-10-27 DIAGNOSIS — N186 End stage renal disease: Secondary | ICD-10-CM | POA: Diagnosis not present

## 2013-10-28 DIAGNOSIS — D509 Iron deficiency anemia, unspecified: Secondary | ICD-10-CM | POA: Diagnosis not present

## 2013-10-28 DIAGNOSIS — N2581 Secondary hyperparathyroidism of renal origin: Secondary | ICD-10-CM | POA: Diagnosis not present

## 2013-10-28 DIAGNOSIS — E119 Type 2 diabetes mellitus without complications: Secondary | ICD-10-CM | POA: Diagnosis not present

## 2013-10-28 DIAGNOSIS — N186 End stage renal disease: Secondary | ICD-10-CM | POA: Diagnosis not present

## 2013-11-11 DIAGNOSIS — D649 Anemia, unspecified: Secondary | ICD-10-CM | POA: Diagnosis not present

## 2013-11-11 DIAGNOSIS — I10 Essential (primary) hypertension: Secondary | ICD-10-CM | POA: Diagnosis not present

## 2013-11-11 DIAGNOSIS — K219 Gastro-esophageal reflux disease without esophagitis: Secondary | ICD-10-CM | POA: Diagnosis not present

## 2013-11-11 DIAGNOSIS — I2510 Atherosclerotic heart disease of native coronary artery without angina pectoris: Secondary | ICD-10-CM | POA: Diagnosis not present

## 2013-11-11 DIAGNOSIS — R7309 Other abnormal glucose: Secondary | ICD-10-CM | POA: Diagnosis not present

## 2013-11-18 DIAGNOSIS — E119 Type 2 diabetes mellitus without complications: Secondary | ICD-10-CM | POA: Diagnosis not present

## 2013-11-23 DIAGNOSIS — I2510 Atherosclerotic heart disease of native coronary artery without angina pectoris: Secondary | ICD-10-CM | POA: Diagnosis not present

## 2013-11-23 DIAGNOSIS — I10 Essential (primary) hypertension: Secondary | ICD-10-CM | POA: Diagnosis not present

## 2013-11-23 DIAGNOSIS — E7800 Pure hypercholesterolemia, unspecified: Secondary | ICD-10-CM | POA: Diagnosis not present

## 2013-11-26 DIAGNOSIS — N186 End stage renal disease: Secondary | ICD-10-CM | POA: Diagnosis not present

## 2013-11-27 DIAGNOSIS — N186 End stage renal disease: Secondary | ICD-10-CM | POA: Diagnosis not present

## 2013-11-27 DIAGNOSIS — E119 Type 2 diabetes mellitus without complications: Secondary | ICD-10-CM | POA: Diagnosis not present

## 2013-11-27 DIAGNOSIS — N2581 Secondary hyperparathyroidism of renal origin: Secondary | ICD-10-CM | POA: Diagnosis not present

## 2013-11-27 DIAGNOSIS — D509 Iron deficiency anemia, unspecified: Secondary | ICD-10-CM | POA: Diagnosis not present

## 2013-12-27 DIAGNOSIS — N186 End stage renal disease: Secondary | ICD-10-CM | POA: Diagnosis not present

## 2013-12-28 DIAGNOSIS — N2581 Secondary hyperparathyroidism of renal origin: Secondary | ICD-10-CM | POA: Diagnosis not present

## 2013-12-28 DIAGNOSIS — D509 Iron deficiency anemia, unspecified: Secondary | ICD-10-CM | POA: Diagnosis not present

## 2013-12-28 DIAGNOSIS — E119 Type 2 diabetes mellitus without complications: Secondary | ICD-10-CM | POA: Diagnosis not present

## 2013-12-28 DIAGNOSIS — N186 End stage renal disease: Secondary | ICD-10-CM | POA: Diagnosis not present

## 2014-01-25 DIAGNOSIS — E669 Obesity, unspecified: Secondary | ICD-10-CM | POA: Diagnosis not present

## 2014-01-25 DIAGNOSIS — E785 Hyperlipidemia, unspecified: Secondary | ICD-10-CM | POA: Diagnosis not present

## 2014-01-25 DIAGNOSIS — R7309 Other abnormal glucose: Secondary | ICD-10-CM | POA: Diagnosis not present

## 2014-01-25 DIAGNOSIS — I2510 Atherosclerotic heart disease of native coronary artery without angina pectoris: Secondary | ICD-10-CM | POA: Diagnosis not present

## 2014-01-25 DIAGNOSIS — D649 Anemia, unspecified: Secondary | ICD-10-CM | POA: Diagnosis not present

## 2014-01-25 DIAGNOSIS — I10 Essential (primary) hypertension: Secondary | ICD-10-CM | POA: Diagnosis not present

## 2014-01-25 DIAGNOSIS — N189 Chronic kidney disease, unspecified: Secondary | ICD-10-CM | POA: Diagnosis not present

## 2014-01-26 DIAGNOSIS — N186 End stage renal disease: Secondary | ICD-10-CM | POA: Diagnosis not present

## 2014-01-27 DIAGNOSIS — N2581 Secondary hyperparathyroidism of renal origin: Secondary | ICD-10-CM | POA: Diagnosis not present

## 2014-01-27 DIAGNOSIS — E119 Type 2 diabetes mellitus without complications: Secondary | ICD-10-CM | POA: Diagnosis not present

## 2014-01-27 DIAGNOSIS — D509 Iron deficiency anemia, unspecified: Secondary | ICD-10-CM | POA: Diagnosis not present

## 2014-01-27 DIAGNOSIS — N186 End stage renal disease: Secondary | ICD-10-CM | POA: Diagnosis not present

## 2014-02-17 DIAGNOSIS — E119 Type 2 diabetes mellitus without complications: Secondary | ICD-10-CM | POA: Diagnosis not present

## 2014-02-26 DIAGNOSIS — N186 End stage renal disease: Secondary | ICD-10-CM | POA: Diagnosis not present

## 2014-03-01 DIAGNOSIS — D509 Iron deficiency anemia, unspecified: Secondary | ICD-10-CM | POA: Diagnosis not present

## 2014-03-01 DIAGNOSIS — N186 End stage renal disease: Secondary | ICD-10-CM | POA: Diagnosis not present

## 2014-03-01 DIAGNOSIS — N2581 Secondary hyperparathyroidism of renal origin: Secondary | ICD-10-CM | POA: Diagnosis not present

## 2014-03-03 DIAGNOSIS — N2581 Secondary hyperparathyroidism of renal origin: Secondary | ICD-10-CM | POA: Diagnosis not present

## 2014-03-03 DIAGNOSIS — D509 Iron deficiency anemia, unspecified: Secondary | ICD-10-CM | POA: Diagnosis not present

## 2014-03-03 DIAGNOSIS — N186 End stage renal disease: Secondary | ICD-10-CM | POA: Diagnosis not present

## 2014-03-05 DIAGNOSIS — D509 Iron deficiency anemia, unspecified: Secondary | ICD-10-CM | POA: Diagnosis not present

## 2014-03-05 DIAGNOSIS — N186 End stage renal disease: Secondary | ICD-10-CM | POA: Diagnosis not present

## 2014-03-05 DIAGNOSIS — N2581 Secondary hyperparathyroidism of renal origin: Secondary | ICD-10-CM | POA: Diagnosis not present

## 2014-03-08 DIAGNOSIS — N186 End stage renal disease: Secondary | ICD-10-CM | POA: Diagnosis not present

## 2014-03-08 DIAGNOSIS — D509 Iron deficiency anemia, unspecified: Secondary | ICD-10-CM | POA: Diagnosis not present

## 2014-03-08 DIAGNOSIS — N2581 Secondary hyperparathyroidism of renal origin: Secondary | ICD-10-CM | POA: Diagnosis not present

## 2014-03-10 DIAGNOSIS — N186 End stage renal disease: Secondary | ICD-10-CM | POA: Diagnosis not present

## 2014-03-10 DIAGNOSIS — N2581 Secondary hyperparathyroidism of renal origin: Secondary | ICD-10-CM | POA: Diagnosis not present

## 2014-03-10 DIAGNOSIS — D509 Iron deficiency anemia, unspecified: Secondary | ICD-10-CM | POA: Diagnosis not present

## 2014-03-12 DIAGNOSIS — D509 Iron deficiency anemia, unspecified: Secondary | ICD-10-CM | POA: Diagnosis not present

## 2014-03-12 DIAGNOSIS — N186 End stage renal disease: Secondary | ICD-10-CM | POA: Diagnosis not present

## 2014-03-12 DIAGNOSIS — N2581 Secondary hyperparathyroidism of renal origin: Secondary | ICD-10-CM | POA: Diagnosis not present

## 2014-03-15 DIAGNOSIS — N2581 Secondary hyperparathyroidism of renal origin: Secondary | ICD-10-CM | POA: Diagnosis not present

## 2014-03-15 DIAGNOSIS — D509 Iron deficiency anemia, unspecified: Secondary | ICD-10-CM | POA: Diagnosis not present

## 2014-03-15 DIAGNOSIS — N186 End stage renal disease: Secondary | ICD-10-CM | POA: Diagnosis not present

## 2014-03-17 DIAGNOSIS — N2581 Secondary hyperparathyroidism of renal origin: Secondary | ICD-10-CM | POA: Diagnosis not present

## 2014-03-17 DIAGNOSIS — D509 Iron deficiency anemia, unspecified: Secondary | ICD-10-CM | POA: Diagnosis not present

## 2014-03-17 DIAGNOSIS — N186 End stage renal disease: Secondary | ICD-10-CM | POA: Diagnosis not present

## 2014-03-19 DIAGNOSIS — D509 Iron deficiency anemia, unspecified: Secondary | ICD-10-CM | POA: Diagnosis not present

## 2014-03-19 DIAGNOSIS — N2581 Secondary hyperparathyroidism of renal origin: Secondary | ICD-10-CM | POA: Diagnosis not present

## 2014-03-19 DIAGNOSIS — N186 End stage renal disease: Secondary | ICD-10-CM | POA: Diagnosis not present

## 2014-03-22 DIAGNOSIS — D509 Iron deficiency anemia, unspecified: Secondary | ICD-10-CM | POA: Diagnosis not present

## 2014-03-22 DIAGNOSIS — N186 End stage renal disease: Secondary | ICD-10-CM | POA: Diagnosis not present

## 2014-03-22 DIAGNOSIS — N2581 Secondary hyperparathyroidism of renal origin: Secondary | ICD-10-CM | POA: Diagnosis not present

## 2014-03-24 DIAGNOSIS — N2581 Secondary hyperparathyroidism of renal origin: Secondary | ICD-10-CM | POA: Diagnosis not present

## 2014-03-24 DIAGNOSIS — D509 Iron deficiency anemia, unspecified: Secondary | ICD-10-CM | POA: Diagnosis not present

## 2014-03-24 DIAGNOSIS — N186 End stage renal disease: Secondary | ICD-10-CM | POA: Diagnosis not present

## 2014-03-26 DIAGNOSIS — D509 Iron deficiency anemia, unspecified: Secondary | ICD-10-CM | POA: Diagnosis not present

## 2014-03-26 DIAGNOSIS — N2581 Secondary hyperparathyroidism of renal origin: Secondary | ICD-10-CM | POA: Diagnosis not present

## 2014-03-26 DIAGNOSIS — N186 End stage renal disease: Secondary | ICD-10-CM | POA: Diagnosis not present

## 2014-03-29 DIAGNOSIS — N2581 Secondary hyperparathyroidism of renal origin: Secondary | ICD-10-CM | POA: Diagnosis not present

## 2014-03-29 DIAGNOSIS — N186 End stage renal disease: Secondary | ICD-10-CM | POA: Diagnosis not present

## 2014-03-29 DIAGNOSIS — D509 Iron deficiency anemia, unspecified: Secondary | ICD-10-CM | POA: Diagnosis not present

## 2014-03-31 DIAGNOSIS — D509 Iron deficiency anemia, unspecified: Secondary | ICD-10-CM | POA: Diagnosis not present

## 2014-03-31 DIAGNOSIS — E119 Type 2 diabetes mellitus without complications: Secondary | ICD-10-CM | POA: Diagnosis not present

## 2014-03-31 DIAGNOSIS — N186 End stage renal disease: Secondary | ICD-10-CM | POA: Diagnosis not present

## 2014-03-31 DIAGNOSIS — N2581 Secondary hyperparathyroidism of renal origin: Secondary | ICD-10-CM | POA: Diagnosis not present

## 2014-04-28 DIAGNOSIS — N186 End stage renal disease: Secondary | ICD-10-CM | POA: Diagnosis not present

## 2014-04-30 DIAGNOSIS — N2581 Secondary hyperparathyroidism of renal origin: Secondary | ICD-10-CM | POA: Diagnosis not present

## 2014-04-30 DIAGNOSIS — N186 End stage renal disease: Secondary | ICD-10-CM | POA: Diagnosis not present

## 2014-04-30 DIAGNOSIS — E119 Type 2 diabetes mellitus without complications: Secondary | ICD-10-CM | POA: Diagnosis not present

## 2014-04-30 DIAGNOSIS — D509 Iron deficiency anemia, unspecified: Secondary | ICD-10-CM | POA: Diagnosis not present

## 2014-04-30 DIAGNOSIS — Z23 Encounter for immunization: Secondary | ICD-10-CM | POA: Diagnosis not present

## 2014-05-26 DIAGNOSIS — E119 Type 2 diabetes mellitus without complications: Secondary | ICD-10-CM | POA: Diagnosis not present

## 2014-05-29 DIAGNOSIS — Z992 Dependence on renal dialysis: Secondary | ICD-10-CM | POA: Diagnosis not present

## 2014-05-29 DIAGNOSIS — N186 End stage renal disease: Secondary | ICD-10-CM | POA: Diagnosis not present

## 2014-05-31 DIAGNOSIS — R7302 Impaired glucose tolerance (oral): Secondary | ICD-10-CM | POA: Diagnosis not present

## 2014-05-31 DIAGNOSIS — D509 Iron deficiency anemia, unspecified: Secondary | ICD-10-CM | POA: Diagnosis not present

## 2014-05-31 DIAGNOSIS — E119 Type 2 diabetes mellitus without complications: Secondary | ICD-10-CM | POA: Diagnosis not present

## 2014-05-31 DIAGNOSIS — E669 Obesity, unspecified: Secondary | ICD-10-CM | POA: Diagnosis not present

## 2014-05-31 DIAGNOSIS — I10 Essential (primary) hypertension: Secondary | ICD-10-CM | POA: Diagnosis not present

## 2014-05-31 DIAGNOSIS — I2510 Atherosclerotic heart disease of native coronary artery without angina pectoris: Secondary | ICD-10-CM | POA: Diagnosis not present

## 2014-05-31 DIAGNOSIS — Z992 Dependence on renal dialysis: Secondary | ICD-10-CM | POA: Diagnosis not present

## 2014-05-31 DIAGNOSIS — N186 End stage renal disease: Secondary | ICD-10-CM | POA: Diagnosis not present

## 2014-05-31 DIAGNOSIS — N2581 Secondary hyperparathyroidism of renal origin: Secondary | ICD-10-CM | POA: Diagnosis not present

## 2014-05-31 DIAGNOSIS — I129 Hypertensive chronic kidney disease with stage 1 through stage 4 chronic kidney disease, or unspecified chronic kidney disease: Secondary | ICD-10-CM | POA: Diagnosis not present

## 2014-05-31 DIAGNOSIS — E785 Hyperlipidemia, unspecified: Secondary | ICD-10-CM | POA: Diagnosis not present

## 2014-06-02 DIAGNOSIS — N186 End stage renal disease: Secondary | ICD-10-CM | POA: Diagnosis not present

## 2014-06-02 DIAGNOSIS — D509 Iron deficiency anemia, unspecified: Secondary | ICD-10-CM | POA: Diagnosis not present

## 2014-06-02 DIAGNOSIS — E119 Type 2 diabetes mellitus without complications: Secondary | ICD-10-CM | POA: Diagnosis not present

## 2014-06-02 DIAGNOSIS — N2581 Secondary hyperparathyroidism of renal origin: Secondary | ICD-10-CM | POA: Diagnosis not present

## 2014-06-04 DIAGNOSIS — N2581 Secondary hyperparathyroidism of renal origin: Secondary | ICD-10-CM | POA: Diagnosis not present

## 2014-06-04 DIAGNOSIS — E119 Type 2 diabetes mellitus without complications: Secondary | ICD-10-CM | POA: Diagnosis not present

## 2014-06-04 DIAGNOSIS — N186 End stage renal disease: Secondary | ICD-10-CM | POA: Diagnosis not present

## 2014-06-04 DIAGNOSIS — D509 Iron deficiency anemia, unspecified: Secondary | ICD-10-CM | POA: Diagnosis not present

## 2014-06-07 DIAGNOSIS — D509 Iron deficiency anemia, unspecified: Secondary | ICD-10-CM | POA: Diagnosis not present

## 2014-06-07 DIAGNOSIS — E119 Type 2 diabetes mellitus without complications: Secondary | ICD-10-CM | POA: Diagnosis not present

## 2014-06-07 DIAGNOSIS — N186 End stage renal disease: Secondary | ICD-10-CM | POA: Diagnosis not present

## 2014-06-07 DIAGNOSIS — N2581 Secondary hyperparathyroidism of renal origin: Secondary | ICD-10-CM | POA: Diagnosis not present

## 2014-06-09 DIAGNOSIS — D509 Iron deficiency anemia, unspecified: Secondary | ICD-10-CM | POA: Diagnosis not present

## 2014-06-09 DIAGNOSIS — N186 End stage renal disease: Secondary | ICD-10-CM | POA: Diagnosis not present

## 2014-06-09 DIAGNOSIS — E119 Type 2 diabetes mellitus without complications: Secondary | ICD-10-CM | POA: Diagnosis not present

## 2014-06-09 DIAGNOSIS — N2581 Secondary hyperparathyroidism of renal origin: Secondary | ICD-10-CM | POA: Diagnosis not present

## 2014-06-11 DIAGNOSIS — N186 End stage renal disease: Secondary | ICD-10-CM | POA: Diagnosis not present

## 2014-06-11 DIAGNOSIS — N2581 Secondary hyperparathyroidism of renal origin: Secondary | ICD-10-CM | POA: Diagnosis not present

## 2014-06-11 DIAGNOSIS — E119 Type 2 diabetes mellitus without complications: Secondary | ICD-10-CM | POA: Diagnosis not present

## 2014-06-11 DIAGNOSIS — D509 Iron deficiency anemia, unspecified: Secondary | ICD-10-CM | POA: Diagnosis not present

## 2014-06-14 DIAGNOSIS — N2581 Secondary hyperparathyroidism of renal origin: Secondary | ICD-10-CM | POA: Diagnosis not present

## 2014-06-14 DIAGNOSIS — E119 Type 2 diabetes mellitus without complications: Secondary | ICD-10-CM | POA: Diagnosis not present

## 2014-06-14 DIAGNOSIS — D509 Iron deficiency anemia, unspecified: Secondary | ICD-10-CM | POA: Diagnosis not present

## 2014-06-14 DIAGNOSIS — N186 End stage renal disease: Secondary | ICD-10-CM | POA: Diagnosis not present

## 2014-06-16 DIAGNOSIS — E119 Type 2 diabetes mellitus without complications: Secondary | ICD-10-CM | POA: Diagnosis not present

## 2014-06-16 DIAGNOSIS — N2581 Secondary hyperparathyroidism of renal origin: Secondary | ICD-10-CM | POA: Diagnosis not present

## 2014-06-16 DIAGNOSIS — N186 End stage renal disease: Secondary | ICD-10-CM | POA: Diagnosis not present

## 2014-06-16 DIAGNOSIS — D509 Iron deficiency anemia, unspecified: Secondary | ICD-10-CM | POA: Diagnosis not present

## 2014-06-18 DIAGNOSIS — D509 Iron deficiency anemia, unspecified: Secondary | ICD-10-CM | POA: Diagnosis not present

## 2014-06-18 DIAGNOSIS — E119 Type 2 diabetes mellitus without complications: Secondary | ICD-10-CM | POA: Diagnosis not present

## 2014-06-18 DIAGNOSIS — N2581 Secondary hyperparathyroidism of renal origin: Secondary | ICD-10-CM | POA: Diagnosis not present

## 2014-06-18 DIAGNOSIS — N186 End stage renal disease: Secondary | ICD-10-CM | POA: Diagnosis not present

## 2014-06-20 DIAGNOSIS — N186 End stage renal disease: Secondary | ICD-10-CM | POA: Diagnosis not present

## 2014-06-20 DIAGNOSIS — E119 Type 2 diabetes mellitus without complications: Secondary | ICD-10-CM | POA: Diagnosis not present

## 2014-06-20 DIAGNOSIS — D509 Iron deficiency anemia, unspecified: Secondary | ICD-10-CM | POA: Diagnosis not present

## 2014-06-20 DIAGNOSIS — N2581 Secondary hyperparathyroidism of renal origin: Secondary | ICD-10-CM | POA: Diagnosis not present

## 2014-06-22 DIAGNOSIS — N186 End stage renal disease: Secondary | ICD-10-CM | POA: Diagnosis not present

## 2014-06-22 DIAGNOSIS — E119 Type 2 diabetes mellitus without complications: Secondary | ICD-10-CM | POA: Diagnosis not present

## 2014-06-22 DIAGNOSIS — N2581 Secondary hyperparathyroidism of renal origin: Secondary | ICD-10-CM | POA: Diagnosis not present

## 2014-06-22 DIAGNOSIS — D509 Iron deficiency anemia, unspecified: Secondary | ICD-10-CM | POA: Diagnosis not present

## 2014-06-25 DIAGNOSIS — N2581 Secondary hyperparathyroidism of renal origin: Secondary | ICD-10-CM | POA: Diagnosis not present

## 2014-06-25 DIAGNOSIS — E119 Type 2 diabetes mellitus without complications: Secondary | ICD-10-CM | POA: Diagnosis not present

## 2014-06-25 DIAGNOSIS — N186 End stage renal disease: Secondary | ICD-10-CM | POA: Diagnosis not present

## 2014-06-25 DIAGNOSIS — D509 Iron deficiency anemia, unspecified: Secondary | ICD-10-CM | POA: Diagnosis not present

## 2014-06-28 DIAGNOSIS — D509 Iron deficiency anemia, unspecified: Secondary | ICD-10-CM | POA: Diagnosis not present

## 2014-06-28 DIAGNOSIS — N182 Chronic kidney disease, stage 2 (mild): Secondary | ICD-10-CM | POA: Diagnosis not present

## 2014-06-28 DIAGNOSIS — E119 Type 2 diabetes mellitus without complications: Secondary | ICD-10-CM | POA: Diagnosis not present

## 2014-06-28 DIAGNOSIS — N2581 Secondary hyperparathyroidism of renal origin: Secondary | ICD-10-CM | POA: Diagnosis not present

## 2014-06-28 DIAGNOSIS — Z992 Dependence on renal dialysis: Secondary | ICD-10-CM | POA: Diagnosis not present

## 2014-06-28 DIAGNOSIS — N186 End stage renal disease: Secondary | ICD-10-CM | POA: Diagnosis not present

## 2014-06-30 DIAGNOSIS — N186 End stage renal disease: Secondary | ICD-10-CM | POA: Diagnosis not present

## 2014-06-30 DIAGNOSIS — E119 Type 2 diabetes mellitus without complications: Secondary | ICD-10-CM | POA: Diagnosis not present

## 2014-06-30 DIAGNOSIS — N2581 Secondary hyperparathyroidism of renal origin: Secondary | ICD-10-CM | POA: Diagnosis not present

## 2014-06-30 DIAGNOSIS — D509 Iron deficiency anemia, unspecified: Secondary | ICD-10-CM | POA: Diagnosis not present

## 2014-07-29 DIAGNOSIS — Z992 Dependence on renal dialysis: Secondary | ICD-10-CM | POA: Diagnosis not present

## 2014-07-29 DIAGNOSIS — N186 End stage renal disease: Secondary | ICD-10-CM | POA: Diagnosis not present

## 2014-08-02 DIAGNOSIS — N186 End stage renal disease: Secondary | ICD-10-CM | POA: Diagnosis not present

## 2014-08-02 DIAGNOSIS — E119 Type 2 diabetes mellitus without complications: Secondary | ICD-10-CM | POA: Diagnosis not present

## 2014-08-02 DIAGNOSIS — E1029 Type 1 diabetes mellitus with other diabetic kidney complication: Secondary | ICD-10-CM | POA: Diagnosis not present

## 2014-08-02 DIAGNOSIS — D509 Iron deficiency anemia, unspecified: Secondary | ICD-10-CM | POA: Diagnosis not present

## 2014-08-02 DIAGNOSIS — N2581 Secondary hyperparathyroidism of renal origin: Secondary | ICD-10-CM | POA: Diagnosis not present

## 2014-08-04 DIAGNOSIS — D509 Iron deficiency anemia, unspecified: Secondary | ICD-10-CM | POA: Diagnosis not present

## 2014-08-04 DIAGNOSIS — E119 Type 2 diabetes mellitus without complications: Secondary | ICD-10-CM | POA: Diagnosis not present

## 2014-08-04 DIAGNOSIS — N2581 Secondary hyperparathyroidism of renal origin: Secondary | ICD-10-CM | POA: Diagnosis not present

## 2014-08-04 DIAGNOSIS — N186 End stage renal disease: Secondary | ICD-10-CM | POA: Diagnosis not present

## 2014-08-04 DIAGNOSIS — E1029 Type 1 diabetes mellitus with other diabetic kidney complication: Secondary | ICD-10-CM | POA: Diagnosis not present

## 2014-08-06 DIAGNOSIS — N2581 Secondary hyperparathyroidism of renal origin: Secondary | ICD-10-CM | POA: Diagnosis not present

## 2014-08-06 DIAGNOSIS — E119 Type 2 diabetes mellitus without complications: Secondary | ICD-10-CM | POA: Diagnosis not present

## 2014-08-06 DIAGNOSIS — D509 Iron deficiency anemia, unspecified: Secondary | ICD-10-CM | POA: Diagnosis not present

## 2014-08-06 DIAGNOSIS — E1029 Type 1 diabetes mellitus with other diabetic kidney complication: Secondary | ICD-10-CM | POA: Diagnosis not present

## 2014-08-06 DIAGNOSIS — N186 End stage renal disease: Secondary | ICD-10-CM | POA: Diagnosis not present

## 2014-08-09 DIAGNOSIS — D509 Iron deficiency anemia, unspecified: Secondary | ICD-10-CM | POA: Diagnosis not present

## 2014-08-09 DIAGNOSIS — E119 Type 2 diabetes mellitus without complications: Secondary | ICD-10-CM | POA: Diagnosis not present

## 2014-08-09 DIAGNOSIS — E1029 Type 1 diabetes mellitus with other diabetic kidney complication: Secondary | ICD-10-CM | POA: Diagnosis not present

## 2014-08-09 DIAGNOSIS — N2581 Secondary hyperparathyroidism of renal origin: Secondary | ICD-10-CM | POA: Diagnosis not present

## 2014-08-09 DIAGNOSIS — N186 End stage renal disease: Secondary | ICD-10-CM | POA: Diagnosis not present

## 2014-08-11 DIAGNOSIS — E119 Type 2 diabetes mellitus without complications: Secondary | ICD-10-CM | POA: Diagnosis not present

## 2014-08-11 DIAGNOSIS — D509 Iron deficiency anemia, unspecified: Secondary | ICD-10-CM | POA: Diagnosis not present

## 2014-08-11 DIAGNOSIS — N186 End stage renal disease: Secondary | ICD-10-CM | POA: Diagnosis not present

## 2014-08-11 DIAGNOSIS — N2581 Secondary hyperparathyroidism of renal origin: Secondary | ICD-10-CM | POA: Diagnosis not present

## 2014-08-11 DIAGNOSIS — E1029 Type 1 diabetes mellitus with other diabetic kidney complication: Secondary | ICD-10-CM | POA: Diagnosis not present

## 2014-08-13 DIAGNOSIS — D649 Anemia, unspecified: Secondary | ICD-10-CM | POA: Diagnosis not present

## 2014-08-13 DIAGNOSIS — N186 End stage renal disease: Secondary | ICD-10-CM | POA: Diagnosis not present

## 2014-08-13 DIAGNOSIS — E119 Type 2 diabetes mellitus without complications: Secondary | ICD-10-CM | POA: Diagnosis not present

## 2014-08-13 DIAGNOSIS — I10 Essential (primary) hypertension: Secondary | ICD-10-CM | POA: Diagnosis not present

## 2014-08-13 DIAGNOSIS — E785 Hyperlipidemia, unspecified: Secondary | ICD-10-CM | POA: Diagnosis not present

## 2014-08-13 DIAGNOSIS — D509 Iron deficiency anemia, unspecified: Secondary | ICD-10-CM | POA: Diagnosis not present

## 2014-08-13 DIAGNOSIS — N189 Chronic kidney disease, unspecified: Secondary | ICD-10-CM | POA: Diagnosis not present

## 2014-08-13 DIAGNOSIS — E1029 Type 1 diabetes mellitus with other diabetic kidney complication: Secondary | ICD-10-CM | POA: Diagnosis not present

## 2014-08-13 DIAGNOSIS — E669 Obesity, unspecified: Secondary | ICD-10-CM | POA: Diagnosis not present

## 2014-08-13 DIAGNOSIS — I2510 Atherosclerotic heart disease of native coronary artery without angina pectoris: Secondary | ICD-10-CM | POA: Diagnosis not present

## 2014-08-13 DIAGNOSIS — N2581 Secondary hyperparathyroidism of renal origin: Secondary | ICD-10-CM | POA: Diagnosis not present

## 2014-08-13 DIAGNOSIS — R7309 Other abnormal glucose: Secondary | ICD-10-CM | POA: Diagnosis not present

## 2014-08-16 DIAGNOSIS — N186 End stage renal disease: Secondary | ICD-10-CM | POA: Diagnosis not present

## 2014-08-16 DIAGNOSIS — N2581 Secondary hyperparathyroidism of renal origin: Secondary | ICD-10-CM | POA: Diagnosis not present

## 2014-08-16 DIAGNOSIS — E1029 Type 1 diabetes mellitus with other diabetic kidney complication: Secondary | ICD-10-CM | POA: Diagnosis not present

## 2014-08-16 DIAGNOSIS — E119 Type 2 diabetes mellitus without complications: Secondary | ICD-10-CM | POA: Diagnosis not present

## 2014-08-16 DIAGNOSIS — D509 Iron deficiency anemia, unspecified: Secondary | ICD-10-CM | POA: Diagnosis not present

## 2014-08-18 DIAGNOSIS — E1029 Type 1 diabetes mellitus with other diabetic kidney complication: Secondary | ICD-10-CM | POA: Diagnosis not present

## 2014-08-18 DIAGNOSIS — N186 End stage renal disease: Secondary | ICD-10-CM | POA: Diagnosis not present

## 2014-08-18 DIAGNOSIS — E119 Type 2 diabetes mellitus without complications: Secondary | ICD-10-CM | POA: Diagnosis not present

## 2014-08-18 DIAGNOSIS — N2581 Secondary hyperparathyroidism of renal origin: Secondary | ICD-10-CM | POA: Diagnosis not present

## 2014-08-18 DIAGNOSIS — D509 Iron deficiency anemia, unspecified: Secondary | ICD-10-CM | POA: Diagnosis not present

## 2014-08-20 DIAGNOSIS — D509 Iron deficiency anemia, unspecified: Secondary | ICD-10-CM | POA: Diagnosis not present

## 2014-08-20 DIAGNOSIS — E119 Type 2 diabetes mellitus without complications: Secondary | ICD-10-CM | POA: Diagnosis not present

## 2014-08-20 DIAGNOSIS — N186 End stage renal disease: Secondary | ICD-10-CM | POA: Diagnosis not present

## 2014-08-20 DIAGNOSIS — E1029 Type 1 diabetes mellitus with other diabetic kidney complication: Secondary | ICD-10-CM | POA: Diagnosis not present

## 2014-08-20 DIAGNOSIS — N2581 Secondary hyperparathyroidism of renal origin: Secondary | ICD-10-CM | POA: Diagnosis not present

## 2014-08-23 DIAGNOSIS — D509 Iron deficiency anemia, unspecified: Secondary | ICD-10-CM | POA: Diagnosis not present

## 2014-08-23 DIAGNOSIS — E1029 Type 1 diabetes mellitus with other diabetic kidney complication: Secondary | ICD-10-CM | POA: Diagnosis not present

## 2014-08-23 DIAGNOSIS — E119 Type 2 diabetes mellitus without complications: Secondary | ICD-10-CM | POA: Diagnosis not present

## 2014-08-23 DIAGNOSIS — N186 End stage renal disease: Secondary | ICD-10-CM | POA: Diagnosis not present

## 2014-08-23 DIAGNOSIS — N2581 Secondary hyperparathyroidism of renal origin: Secondary | ICD-10-CM | POA: Diagnosis not present

## 2014-08-25 DIAGNOSIS — N186 End stage renal disease: Secondary | ICD-10-CM | POA: Diagnosis not present

## 2014-08-25 DIAGNOSIS — E119 Type 2 diabetes mellitus without complications: Secondary | ICD-10-CM | POA: Diagnosis not present

## 2014-08-25 DIAGNOSIS — N2581 Secondary hyperparathyroidism of renal origin: Secondary | ICD-10-CM | POA: Diagnosis not present

## 2014-08-25 DIAGNOSIS — D509 Iron deficiency anemia, unspecified: Secondary | ICD-10-CM | POA: Diagnosis not present

## 2014-08-25 DIAGNOSIS — E1029 Type 1 diabetes mellitus with other diabetic kidney complication: Secondary | ICD-10-CM | POA: Diagnosis not present

## 2014-08-27 DIAGNOSIS — E1029 Type 1 diabetes mellitus with other diabetic kidney complication: Secondary | ICD-10-CM | POA: Diagnosis not present

## 2014-08-27 DIAGNOSIS — E119 Type 2 diabetes mellitus without complications: Secondary | ICD-10-CM | POA: Diagnosis not present

## 2014-08-27 DIAGNOSIS — N186 End stage renal disease: Secondary | ICD-10-CM | POA: Diagnosis not present

## 2014-08-27 DIAGNOSIS — D509 Iron deficiency anemia, unspecified: Secondary | ICD-10-CM | POA: Diagnosis not present

## 2014-08-27 DIAGNOSIS — N2581 Secondary hyperparathyroidism of renal origin: Secondary | ICD-10-CM | POA: Diagnosis not present

## 2014-08-29 DIAGNOSIS — N186 End stage renal disease: Secondary | ICD-10-CM | POA: Diagnosis not present

## 2014-08-29 DIAGNOSIS — Z992 Dependence on renal dialysis: Secondary | ICD-10-CM | POA: Diagnosis not present

## 2014-08-30 DIAGNOSIS — E119 Type 2 diabetes mellitus without complications: Secondary | ICD-10-CM | POA: Diagnosis not present

## 2014-08-30 DIAGNOSIS — D509 Iron deficiency anemia, unspecified: Secondary | ICD-10-CM | POA: Diagnosis not present

## 2014-08-30 DIAGNOSIS — N186 End stage renal disease: Secondary | ICD-10-CM | POA: Diagnosis not present

## 2014-08-30 DIAGNOSIS — E1029 Type 1 diabetes mellitus with other diabetic kidney complication: Secondary | ICD-10-CM | POA: Diagnosis not present

## 2014-08-30 DIAGNOSIS — N2581 Secondary hyperparathyroidism of renal origin: Secondary | ICD-10-CM | POA: Diagnosis not present

## 2014-09-01 DIAGNOSIS — E1029 Type 1 diabetes mellitus with other diabetic kidney complication: Secondary | ICD-10-CM | POA: Diagnosis not present

## 2014-09-01 DIAGNOSIS — N186 End stage renal disease: Secondary | ICD-10-CM | POA: Diagnosis not present

## 2014-09-01 DIAGNOSIS — E119 Type 2 diabetes mellitus without complications: Secondary | ICD-10-CM | POA: Diagnosis not present

## 2014-09-01 DIAGNOSIS — N2581 Secondary hyperparathyroidism of renal origin: Secondary | ICD-10-CM | POA: Diagnosis not present

## 2014-09-01 DIAGNOSIS — D509 Iron deficiency anemia, unspecified: Secondary | ICD-10-CM | POA: Diagnosis not present

## 2014-09-03 DIAGNOSIS — N186 End stage renal disease: Secondary | ICD-10-CM | POA: Diagnosis not present

## 2014-09-03 DIAGNOSIS — N2581 Secondary hyperparathyroidism of renal origin: Secondary | ICD-10-CM | POA: Diagnosis not present

## 2014-09-03 DIAGNOSIS — D509 Iron deficiency anemia, unspecified: Secondary | ICD-10-CM | POA: Diagnosis not present

## 2014-09-03 DIAGNOSIS — E119 Type 2 diabetes mellitus without complications: Secondary | ICD-10-CM | POA: Diagnosis not present

## 2014-09-03 DIAGNOSIS — E1029 Type 1 diabetes mellitus with other diabetic kidney complication: Secondary | ICD-10-CM | POA: Diagnosis not present

## 2014-09-06 DIAGNOSIS — D509 Iron deficiency anemia, unspecified: Secondary | ICD-10-CM | POA: Diagnosis not present

## 2014-09-06 DIAGNOSIS — E1029 Type 1 diabetes mellitus with other diabetic kidney complication: Secondary | ICD-10-CM | POA: Diagnosis not present

## 2014-09-06 DIAGNOSIS — N186 End stage renal disease: Secondary | ICD-10-CM | POA: Diagnosis not present

## 2014-09-06 DIAGNOSIS — E119 Type 2 diabetes mellitus without complications: Secondary | ICD-10-CM | POA: Diagnosis not present

## 2014-09-06 DIAGNOSIS — N2581 Secondary hyperparathyroidism of renal origin: Secondary | ICD-10-CM | POA: Diagnosis not present

## 2014-09-08 DIAGNOSIS — N186 End stage renal disease: Secondary | ICD-10-CM | POA: Diagnosis not present

## 2014-09-08 DIAGNOSIS — D509 Iron deficiency anemia, unspecified: Secondary | ICD-10-CM | POA: Diagnosis not present

## 2014-09-08 DIAGNOSIS — E119 Type 2 diabetes mellitus without complications: Secondary | ICD-10-CM | POA: Diagnosis not present

## 2014-09-08 DIAGNOSIS — E1029 Type 1 diabetes mellitus with other diabetic kidney complication: Secondary | ICD-10-CM | POA: Diagnosis not present

## 2014-09-08 DIAGNOSIS — N2581 Secondary hyperparathyroidism of renal origin: Secondary | ICD-10-CM | POA: Diagnosis not present

## 2014-09-10 DIAGNOSIS — N186 End stage renal disease: Secondary | ICD-10-CM | POA: Diagnosis not present

## 2014-09-10 DIAGNOSIS — N2581 Secondary hyperparathyroidism of renal origin: Secondary | ICD-10-CM | POA: Diagnosis not present

## 2014-09-10 DIAGNOSIS — E119 Type 2 diabetes mellitus without complications: Secondary | ICD-10-CM | POA: Diagnosis not present

## 2014-09-10 DIAGNOSIS — D509 Iron deficiency anemia, unspecified: Secondary | ICD-10-CM | POA: Diagnosis not present

## 2014-09-10 DIAGNOSIS — E1029 Type 1 diabetes mellitus with other diabetic kidney complication: Secondary | ICD-10-CM | POA: Diagnosis not present

## 2014-09-13 DIAGNOSIS — D509 Iron deficiency anemia, unspecified: Secondary | ICD-10-CM | POA: Diagnosis not present

## 2014-09-13 DIAGNOSIS — N186 End stage renal disease: Secondary | ICD-10-CM | POA: Diagnosis not present

## 2014-09-13 DIAGNOSIS — N2581 Secondary hyperparathyroidism of renal origin: Secondary | ICD-10-CM | POA: Diagnosis not present

## 2014-09-13 DIAGNOSIS — E119 Type 2 diabetes mellitus without complications: Secondary | ICD-10-CM | POA: Diagnosis not present

## 2014-09-13 DIAGNOSIS — E1029 Type 1 diabetes mellitus with other diabetic kidney complication: Secondary | ICD-10-CM | POA: Diagnosis not present

## 2014-09-15 DIAGNOSIS — E1029 Type 1 diabetes mellitus with other diabetic kidney complication: Secondary | ICD-10-CM | POA: Diagnosis not present

## 2014-09-15 DIAGNOSIS — D509 Iron deficiency anemia, unspecified: Secondary | ICD-10-CM | POA: Diagnosis not present

## 2014-09-15 DIAGNOSIS — E119 Type 2 diabetes mellitus without complications: Secondary | ICD-10-CM | POA: Diagnosis not present

## 2014-09-15 DIAGNOSIS — N186 End stage renal disease: Secondary | ICD-10-CM | POA: Diagnosis not present

## 2014-09-15 DIAGNOSIS — N2581 Secondary hyperparathyroidism of renal origin: Secondary | ICD-10-CM | POA: Diagnosis not present

## 2014-09-17 DIAGNOSIS — N186 End stage renal disease: Secondary | ICD-10-CM | POA: Diagnosis not present

## 2014-09-17 DIAGNOSIS — E1029 Type 1 diabetes mellitus with other diabetic kidney complication: Secondary | ICD-10-CM | POA: Diagnosis not present

## 2014-09-17 DIAGNOSIS — N2581 Secondary hyperparathyroidism of renal origin: Secondary | ICD-10-CM | POA: Diagnosis not present

## 2014-09-17 DIAGNOSIS — E119 Type 2 diabetes mellitus without complications: Secondary | ICD-10-CM | POA: Diagnosis not present

## 2014-09-17 DIAGNOSIS — D509 Iron deficiency anemia, unspecified: Secondary | ICD-10-CM | POA: Diagnosis not present

## 2014-09-20 DIAGNOSIS — N2581 Secondary hyperparathyroidism of renal origin: Secondary | ICD-10-CM | POA: Diagnosis not present

## 2014-09-20 DIAGNOSIS — E1029 Type 1 diabetes mellitus with other diabetic kidney complication: Secondary | ICD-10-CM | POA: Diagnosis not present

## 2014-09-20 DIAGNOSIS — N186 End stage renal disease: Secondary | ICD-10-CM | POA: Diagnosis not present

## 2014-09-20 DIAGNOSIS — D509 Iron deficiency anemia, unspecified: Secondary | ICD-10-CM | POA: Diagnosis not present

## 2014-09-20 DIAGNOSIS — E119 Type 2 diabetes mellitus without complications: Secondary | ICD-10-CM | POA: Diagnosis not present

## 2014-09-22 DIAGNOSIS — N186 End stage renal disease: Secondary | ICD-10-CM | POA: Diagnosis not present

## 2014-09-22 DIAGNOSIS — E1029 Type 1 diabetes mellitus with other diabetic kidney complication: Secondary | ICD-10-CM | POA: Diagnosis not present

## 2014-09-22 DIAGNOSIS — E119 Type 2 diabetes mellitus without complications: Secondary | ICD-10-CM | POA: Diagnosis not present

## 2014-09-22 DIAGNOSIS — N2581 Secondary hyperparathyroidism of renal origin: Secondary | ICD-10-CM | POA: Diagnosis not present

## 2014-09-22 DIAGNOSIS — D509 Iron deficiency anemia, unspecified: Secondary | ICD-10-CM | POA: Diagnosis not present

## 2014-09-24 DIAGNOSIS — D509 Iron deficiency anemia, unspecified: Secondary | ICD-10-CM | POA: Diagnosis not present

## 2014-09-24 DIAGNOSIS — N186 End stage renal disease: Secondary | ICD-10-CM | POA: Diagnosis not present

## 2014-09-24 DIAGNOSIS — E119 Type 2 diabetes mellitus without complications: Secondary | ICD-10-CM | POA: Diagnosis not present

## 2014-09-24 DIAGNOSIS — E1029 Type 1 diabetes mellitus with other diabetic kidney complication: Secondary | ICD-10-CM | POA: Diagnosis not present

## 2014-09-24 DIAGNOSIS — N2581 Secondary hyperparathyroidism of renal origin: Secondary | ICD-10-CM | POA: Diagnosis not present

## 2014-09-27 DIAGNOSIS — E1029 Type 1 diabetes mellitus with other diabetic kidney complication: Secondary | ICD-10-CM | POA: Diagnosis not present

## 2014-09-27 DIAGNOSIS — D509 Iron deficiency anemia, unspecified: Secondary | ICD-10-CM | POA: Diagnosis not present

## 2014-09-27 DIAGNOSIS — Z992 Dependence on renal dialysis: Secondary | ICD-10-CM | POA: Diagnosis not present

## 2014-09-27 DIAGNOSIS — E119 Type 2 diabetes mellitus without complications: Secondary | ICD-10-CM | POA: Diagnosis not present

## 2014-09-27 DIAGNOSIS — N2581 Secondary hyperparathyroidism of renal origin: Secondary | ICD-10-CM | POA: Diagnosis not present

## 2014-09-27 DIAGNOSIS — N186 End stage renal disease: Secondary | ICD-10-CM | POA: Diagnosis not present

## 2014-09-29 DIAGNOSIS — N2581 Secondary hyperparathyroidism of renal origin: Secondary | ICD-10-CM | POA: Diagnosis not present

## 2014-09-29 DIAGNOSIS — D509 Iron deficiency anemia, unspecified: Secondary | ICD-10-CM | POA: Diagnosis not present

## 2014-09-29 DIAGNOSIS — N186 End stage renal disease: Secondary | ICD-10-CM | POA: Diagnosis not present

## 2014-09-29 DIAGNOSIS — E119 Type 2 diabetes mellitus without complications: Secondary | ICD-10-CM | POA: Diagnosis not present

## 2014-10-01 DIAGNOSIS — D509 Iron deficiency anemia, unspecified: Secondary | ICD-10-CM | POA: Diagnosis not present

## 2014-10-01 DIAGNOSIS — N186 End stage renal disease: Secondary | ICD-10-CM | POA: Diagnosis not present

## 2014-10-01 DIAGNOSIS — N2581 Secondary hyperparathyroidism of renal origin: Secondary | ICD-10-CM | POA: Diagnosis not present

## 2014-10-01 DIAGNOSIS — E119 Type 2 diabetes mellitus without complications: Secondary | ICD-10-CM | POA: Diagnosis not present

## 2014-10-04 DIAGNOSIS — N2581 Secondary hyperparathyroidism of renal origin: Secondary | ICD-10-CM | POA: Diagnosis not present

## 2014-10-04 DIAGNOSIS — D509 Iron deficiency anemia, unspecified: Secondary | ICD-10-CM | POA: Diagnosis not present

## 2014-10-04 DIAGNOSIS — E119 Type 2 diabetes mellitus without complications: Secondary | ICD-10-CM | POA: Diagnosis not present

## 2014-10-04 DIAGNOSIS — N186 End stage renal disease: Secondary | ICD-10-CM | POA: Diagnosis not present

## 2014-10-06 DIAGNOSIS — N186 End stage renal disease: Secondary | ICD-10-CM | POA: Diagnosis not present

## 2014-10-06 DIAGNOSIS — N2581 Secondary hyperparathyroidism of renal origin: Secondary | ICD-10-CM | POA: Diagnosis not present

## 2014-10-06 DIAGNOSIS — D509 Iron deficiency anemia, unspecified: Secondary | ICD-10-CM | POA: Diagnosis not present

## 2014-10-06 DIAGNOSIS — E119 Type 2 diabetes mellitus without complications: Secondary | ICD-10-CM | POA: Diagnosis not present

## 2014-10-08 DIAGNOSIS — E119 Type 2 diabetes mellitus without complications: Secondary | ICD-10-CM | POA: Diagnosis not present

## 2014-10-08 DIAGNOSIS — N2581 Secondary hyperparathyroidism of renal origin: Secondary | ICD-10-CM | POA: Diagnosis not present

## 2014-10-08 DIAGNOSIS — D509 Iron deficiency anemia, unspecified: Secondary | ICD-10-CM | POA: Diagnosis not present

## 2014-10-08 DIAGNOSIS — N186 End stage renal disease: Secondary | ICD-10-CM | POA: Diagnosis not present

## 2014-10-11 DIAGNOSIS — N2581 Secondary hyperparathyroidism of renal origin: Secondary | ICD-10-CM | POA: Diagnosis not present

## 2014-10-11 DIAGNOSIS — D509 Iron deficiency anemia, unspecified: Secondary | ICD-10-CM | POA: Diagnosis not present

## 2014-10-11 DIAGNOSIS — E119 Type 2 diabetes mellitus without complications: Secondary | ICD-10-CM | POA: Diagnosis not present

## 2014-10-11 DIAGNOSIS — N186 End stage renal disease: Secondary | ICD-10-CM | POA: Diagnosis not present

## 2014-10-13 DIAGNOSIS — D509 Iron deficiency anemia, unspecified: Secondary | ICD-10-CM | POA: Diagnosis not present

## 2014-10-13 DIAGNOSIS — N186 End stage renal disease: Secondary | ICD-10-CM | POA: Diagnosis not present

## 2014-10-13 DIAGNOSIS — N2581 Secondary hyperparathyroidism of renal origin: Secondary | ICD-10-CM | POA: Diagnosis not present

## 2014-10-13 DIAGNOSIS — E119 Type 2 diabetes mellitus without complications: Secondary | ICD-10-CM | POA: Diagnosis not present

## 2014-10-15 DIAGNOSIS — N2581 Secondary hyperparathyroidism of renal origin: Secondary | ICD-10-CM | POA: Diagnosis not present

## 2014-10-15 DIAGNOSIS — E119 Type 2 diabetes mellitus without complications: Secondary | ICD-10-CM | POA: Diagnosis not present

## 2014-10-15 DIAGNOSIS — D509 Iron deficiency anemia, unspecified: Secondary | ICD-10-CM | POA: Diagnosis not present

## 2014-10-15 DIAGNOSIS — N186 End stage renal disease: Secondary | ICD-10-CM | POA: Diagnosis not present

## 2014-10-18 DIAGNOSIS — N2581 Secondary hyperparathyroidism of renal origin: Secondary | ICD-10-CM | POA: Diagnosis not present

## 2014-10-18 DIAGNOSIS — N186 End stage renal disease: Secondary | ICD-10-CM | POA: Diagnosis not present

## 2014-10-18 DIAGNOSIS — E119 Type 2 diabetes mellitus without complications: Secondary | ICD-10-CM | POA: Diagnosis not present

## 2014-10-18 DIAGNOSIS — D509 Iron deficiency anemia, unspecified: Secondary | ICD-10-CM | POA: Diagnosis not present

## 2014-10-20 DIAGNOSIS — D509 Iron deficiency anemia, unspecified: Secondary | ICD-10-CM | POA: Diagnosis not present

## 2014-10-20 DIAGNOSIS — E119 Type 2 diabetes mellitus without complications: Secondary | ICD-10-CM | POA: Diagnosis not present

## 2014-10-20 DIAGNOSIS — N186 End stage renal disease: Secondary | ICD-10-CM | POA: Diagnosis not present

## 2014-10-20 DIAGNOSIS — N2581 Secondary hyperparathyroidism of renal origin: Secondary | ICD-10-CM | POA: Diagnosis not present

## 2014-10-22 DIAGNOSIS — N2581 Secondary hyperparathyroidism of renal origin: Secondary | ICD-10-CM | POA: Diagnosis not present

## 2014-10-22 DIAGNOSIS — N186 End stage renal disease: Secondary | ICD-10-CM | POA: Diagnosis not present

## 2014-10-22 DIAGNOSIS — D509 Iron deficiency anemia, unspecified: Secondary | ICD-10-CM | POA: Diagnosis not present

## 2014-10-22 DIAGNOSIS — E119 Type 2 diabetes mellitus without complications: Secondary | ICD-10-CM | POA: Diagnosis not present

## 2014-10-25 DIAGNOSIS — E119 Type 2 diabetes mellitus without complications: Secondary | ICD-10-CM | POA: Diagnosis not present

## 2014-10-25 DIAGNOSIS — N2581 Secondary hyperparathyroidism of renal origin: Secondary | ICD-10-CM | POA: Diagnosis not present

## 2014-10-25 DIAGNOSIS — N186 End stage renal disease: Secondary | ICD-10-CM | POA: Diagnosis not present

## 2014-10-25 DIAGNOSIS — D509 Iron deficiency anemia, unspecified: Secondary | ICD-10-CM | POA: Diagnosis not present

## 2014-10-27 DIAGNOSIS — D509 Iron deficiency anemia, unspecified: Secondary | ICD-10-CM | POA: Diagnosis not present

## 2014-10-27 DIAGNOSIS — E119 Type 2 diabetes mellitus without complications: Secondary | ICD-10-CM | POA: Diagnosis not present

## 2014-10-27 DIAGNOSIS — N2581 Secondary hyperparathyroidism of renal origin: Secondary | ICD-10-CM | POA: Diagnosis not present

## 2014-10-27 DIAGNOSIS — N186 End stage renal disease: Secondary | ICD-10-CM | POA: Diagnosis not present

## 2014-10-28 DIAGNOSIS — N186 End stage renal disease: Secondary | ICD-10-CM | POA: Diagnosis not present

## 2014-10-28 DIAGNOSIS — Z992 Dependence on renal dialysis: Secondary | ICD-10-CM | POA: Diagnosis not present

## 2014-10-28 DIAGNOSIS — I120 Hypertensive chronic kidney disease with stage 5 chronic kidney disease or end stage renal disease: Secondary | ICD-10-CM | POA: Diagnosis not present

## 2014-10-29 DIAGNOSIS — E119 Type 2 diabetes mellitus without complications: Secondary | ICD-10-CM | POA: Diagnosis not present

## 2014-10-29 DIAGNOSIS — D509 Iron deficiency anemia, unspecified: Secondary | ICD-10-CM | POA: Diagnosis not present

## 2014-10-29 DIAGNOSIS — N186 End stage renal disease: Secondary | ICD-10-CM | POA: Diagnosis not present

## 2014-10-29 DIAGNOSIS — N2581 Secondary hyperparathyroidism of renal origin: Secondary | ICD-10-CM | POA: Diagnosis not present

## 2014-11-01 DIAGNOSIS — N186 End stage renal disease: Secondary | ICD-10-CM | POA: Diagnosis not present

## 2014-11-01 DIAGNOSIS — E119 Type 2 diabetes mellitus without complications: Secondary | ICD-10-CM | POA: Diagnosis not present

## 2014-11-01 DIAGNOSIS — D509 Iron deficiency anemia, unspecified: Secondary | ICD-10-CM | POA: Diagnosis not present

## 2014-11-01 DIAGNOSIS — N2581 Secondary hyperparathyroidism of renal origin: Secondary | ICD-10-CM | POA: Diagnosis not present

## 2014-11-03 DIAGNOSIS — E119 Type 2 diabetes mellitus without complications: Secondary | ICD-10-CM | POA: Diagnosis not present

## 2014-11-03 DIAGNOSIS — N2581 Secondary hyperparathyroidism of renal origin: Secondary | ICD-10-CM | POA: Diagnosis not present

## 2014-11-03 DIAGNOSIS — N186 End stage renal disease: Secondary | ICD-10-CM | POA: Diagnosis not present

## 2014-11-03 DIAGNOSIS — D509 Iron deficiency anemia, unspecified: Secondary | ICD-10-CM | POA: Diagnosis not present

## 2014-11-05 DIAGNOSIS — N2581 Secondary hyperparathyroidism of renal origin: Secondary | ICD-10-CM | POA: Diagnosis not present

## 2014-11-05 DIAGNOSIS — D509 Iron deficiency anemia, unspecified: Secondary | ICD-10-CM | POA: Diagnosis not present

## 2014-11-05 DIAGNOSIS — N186 End stage renal disease: Secondary | ICD-10-CM | POA: Diagnosis not present

## 2014-11-05 DIAGNOSIS — E119 Type 2 diabetes mellitus without complications: Secondary | ICD-10-CM | POA: Diagnosis not present

## 2014-11-08 DIAGNOSIS — N2581 Secondary hyperparathyroidism of renal origin: Secondary | ICD-10-CM | POA: Diagnosis not present

## 2014-11-08 DIAGNOSIS — N186 End stage renal disease: Secondary | ICD-10-CM | POA: Diagnosis not present

## 2014-11-08 DIAGNOSIS — D509 Iron deficiency anemia, unspecified: Secondary | ICD-10-CM | POA: Diagnosis not present

## 2014-11-08 DIAGNOSIS — E119 Type 2 diabetes mellitus without complications: Secondary | ICD-10-CM | POA: Diagnosis not present

## 2014-11-10 DIAGNOSIS — N2581 Secondary hyperparathyroidism of renal origin: Secondary | ICD-10-CM | POA: Diagnosis not present

## 2014-11-10 DIAGNOSIS — N186 End stage renal disease: Secondary | ICD-10-CM | POA: Diagnosis not present

## 2014-11-10 DIAGNOSIS — D509 Iron deficiency anemia, unspecified: Secondary | ICD-10-CM | POA: Diagnosis not present

## 2014-11-10 DIAGNOSIS — E119 Type 2 diabetes mellitus without complications: Secondary | ICD-10-CM | POA: Diagnosis not present

## 2014-11-12 DIAGNOSIS — D509 Iron deficiency anemia, unspecified: Secondary | ICD-10-CM | POA: Diagnosis not present

## 2014-11-12 DIAGNOSIS — E119 Type 2 diabetes mellitus without complications: Secondary | ICD-10-CM | POA: Diagnosis not present

## 2014-11-12 DIAGNOSIS — N186 End stage renal disease: Secondary | ICD-10-CM | POA: Diagnosis not present

## 2014-11-12 DIAGNOSIS — N2581 Secondary hyperparathyroidism of renal origin: Secondary | ICD-10-CM | POA: Diagnosis not present

## 2014-11-15 DIAGNOSIS — E119 Type 2 diabetes mellitus without complications: Secondary | ICD-10-CM | POA: Diagnosis not present

## 2014-11-15 DIAGNOSIS — N186 End stage renal disease: Secondary | ICD-10-CM | POA: Diagnosis not present

## 2014-11-15 DIAGNOSIS — N2581 Secondary hyperparathyroidism of renal origin: Secondary | ICD-10-CM | POA: Diagnosis not present

## 2014-11-15 DIAGNOSIS — D509 Iron deficiency anemia, unspecified: Secondary | ICD-10-CM | POA: Diagnosis not present

## 2014-11-17 DIAGNOSIS — N2581 Secondary hyperparathyroidism of renal origin: Secondary | ICD-10-CM | POA: Diagnosis not present

## 2014-11-17 DIAGNOSIS — N186 End stage renal disease: Secondary | ICD-10-CM | POA: Diagnosis not present

## 2014-11-17 DIAGNOSIS — E119 Type 2 diabetes mellitus without complications: Secondary | ICD-10-CM | POA: Diagnosis not present

## 2014-11-17 DIAGNOSIS — D509 Iron deficiency anemia, unspecified: Secondary | ICD-10-CM | POA: Diagnosis not present

## 2014-11-19 ENCOUNTER — Other Ambulatory Visit (HOSPITAL_COMMUNITY): Payer: Self-pay | Admitting: Cardiology

## 2014-11-19 DIAGNOSIS — E119 Type 2 diabetes mellitus without complications: Secondary | ICD-10-CM | POA: Diagnosis not present

## 2014-11-19 DIAGNOSIS — D649 Anemia, unspecified: Secondary | ICD-10-CM | POA: Diagnosis not present

## 2014-11-19 DIAGNOSIS — R0989 Other specified symptoms and signs involving the circulatory and respiratory systems: Secondary | ICD-10-CM

## 2014-11-19 DIAGNOSIS — N2581 Secondary hyperparathyroidism of renal origin: Secondary | ICD-10-CM | POA: Diagnosis not present

## 2014-11-19 DIAGNOSIS — R7309 Other abnormal glucose: Secondary | ICD-10-CM | POA: Diagnosis not present

## 2014-11-19 DIAGNOSIS — I350 Nonrheumatic aortic (valve) stenosis: Secondary | ICD-10-CM | POA: Diagnosis not present

## 2014-11-19 DIAGNOSIS — E669 Obesity, unspecified: Secondary | ICD-10-CM | POA: Diagnosis not present

## 2014-11-19 DIAGNOSIS — D509 Iron deficiency anemia, unspecified: Secondary | ICD-10-CM | POA: Diagnosis not present

## 2014-11-19 DIAGNOSIS — Z992 Dependence on renal dialysis: Secondary | ICD-10-CM | POA: Diagnosis not present

## 2014-11-19 DIAGNOSIS — I2510 Atherosclerotic heart disease of native coronary artery without angina pectoris: Secondary | ICD-10-CM | POA: Diagnosis not present

## 2014-11-19 DIAGNOSIS — I10 Essential (primary) hypertension: Secondary | ICD-10-CM | POA: Diagnosis not present

## 2014-11-19 DIAGNOSIS — N186 End stage renal disease: Secondary | ICD-10-CM | POA: Diagnosis not present

## 2014-11-22 ENCOUNTER — Emergency Department (HOSPITAL_COMMUNITY)
Admission: EM | Admit: 2014-11-22 | Discharge: 2014-11-22 | Disposition: A | Discharge status: Home / Self Care | Payer: Medicare Other | Attending: Emergency Medicine | Admitting: Emergency Medicine

## 2014-11-22 ENCOUNTER — Encounter (HOSPITAL_COMMUNITY): Payer: Self-pay | Admitting: *Deleted

## 2014-11-22 DIAGNOSIS — K219 Gastro-esophageal reflux disease without esophagitis: Secondary | ICD-10-CM | POA: Diagnosis not present

## 2014-11-22 DIAGNOSIS — N186 End stage renal disease: Secondary | ICD-10-CM | POA: Insufficient documentation

## 2014-11-22 DIAGNOSIS — T85691A Other mechanical complication of intraperitoneal dialysis catheter, initial encounter: Secondary | ICD-10-CM | POA: Diagnosis not present

## 2014-11-22 DIAGNOSIS — T829XXA Unspecified complication of cardiac and vascular prosthetic device, implant and graft, initial encounter: Secondary | ICD-10-CM

## 2014-11-22 DIAGNOSIS — I252 Old myocardial infarction: Secondary | ICD-10-CM | POA: Diagnosis not present

## 2014-11-22 DIAGNOSIS — T8189XA Other complications of procedures, not elsewhere classified, initial encounter: Secondary | ICD-10-CM | POA: Diagnosis not present

## 2014-11-22 DIAGNOSIS — I120 Hypertensive chronic kidney disease with stage 5 chronic kidney disease or end stage renal disease: Secondary | ICD-10-CM | POA: Insufficient documentation

## 2014-11-22 DIAGNOSIS — Y841 Kidney dialysis as the cause of abnormal reaction of the patient, or of later complication, without mention of misadventure at the time of the procedure: Secondary | ICD-10-CM | POA: Diagnosis not present

## 2014-11-22 DIAGNOSIS — N2581 Secondary hyperparathyroidism of renal origin: Secondary | ICD-10-CM | POA: Diagnosis not present

## 2014-11-22 DIAGNOSIS — D509 Iron deficiency anemia, unspecified: Secondary | ICD-10-CM | POA: Diagnosis not present

## 2014-11-22 DIAGNOSIS — Z7982 Long term (current) use of aspirin: Secondary | ICD-10-CM | POA: Diagnosis not present

## 2014-11-22 DIAGNOSIS — Z9889 Other specified postprocedural states: Secondary | ICD-10-CM | POA: Diagnosis not present

## 2014-11-22 DIAGNOSIS — E119 Type 2 diabetes mellitus without complications: Secondary | ICD-10-CM | POA: Diagnosis not present

## 2014-11-22 DIAGNOSIS — T82898A Other specified complication of vascular prosthetic devices, implants and grafts, initial encounter: Secondary | ICD-10-CM | POA: Diagnosis not present

## 2014-11-22 DIAGNOSIS — Z79899 Other long term (current) drug therapy: Secondary | ICD-10-CM | POA: Diagnosis not present

## 2014-11-22 LAB — I-STAT CHEM 8, ED
BUN: 32 mg/dL — ABNORMAL HIGH (ref 6–23)
CALCIUM ION: 0.98 mmol/L — AB (ref 1.12–1.23)
Chloride: 94 mmol/L — ABNORMAL LOW (ref 96–112)
Creatinine, Ser: 13 mg/dL — ABNORMAL HIGH (ref 0.50–1.35)
GLUCOSE: 76 mg/dL (ref 70–99)
HCT: 45 % (ref 39.0–52.0)
HEMOGLOBIN: 15.3 g/dL (ref 13.0–17.0)
Potassium: 3.6 mmol/L (ref 3.5–5.1)
Sodium: 140 mmol/L (ref 135–145)
TCO2: 29 mmol/L (ref 0–100)

## 2014-11-22 NOTE — ED Notes (Signed)
Pt reports L upper arm dialysis fistula bleeding during HD treatment. Bleeding controlled upon arrival to ED, pressure bandage remains in place per EMS. Pt denies pain at the time.

## 2014-11-22 NOTE — ED Notes (Signed)
Pt was at dialysis today when he began bleeding around the needles. Pt requested to be taken off however he remained on treatment. Pt received 2.5 hours of treatment. Pt was still bleeding upon EMS arrival. EMS applied pressure dressing. Bleeding controlled at this time. Pulse and sensation intact in left arm.

## 2014-11-22 NOTE — ED Notes (Signed)
Gauze placed over fistula and wrapped with ace wrap, snug, but not tight.  Pt tolerated well, cms intact at baseline distal to acewrap

## 2014-11-22 NOTE — Discharge Instructions (Signed)
Dialysis Vascular Access Malfunction °A vascular access is an entrance to your blood vessels that can be used for dialysis. A vascular access can be made in one of several ways:  °· Joining an artery to a vein under your skin to make a bigger blood vessel called a fistula.   °· Joining an artery to a vein under your skin using a soft tube called a graft.   °· Placing a thin, flexible tube (catheter) in a large vein, usually in your neck.   °A vascular access may malfunction or become blocked.  °WHAT CAN CAUSE YOUR VASCULAR ACCESS TO MALFUNCTION? °· Infection (common).   °· A blood clot inside a part of the fistula, graft, or catheter. A blood clot can completely or partially block the flow of blood.   °· A kink in the graft or catheter.   °· A collection of blood (called a hematoma or bruise) next to the graft or catheter that pushes against it, blocking the flow of blood.   °WHAT ARE SIGNS AND SYMPTOMS OF VASCULAR ACCESS MALFUNCTION? °· There is a change in the vibration or pulse of your fistula or graft. °· The vibration or pulse of your fistula or graft is gone.   °· There is new or unusual swelling of the area around the access.   °· There was an unsuccessful puncture of your access by the dialysis team.   °· The flow of blood through the fistula, graft, or catheter is too slow for effective dialysis.   °· When routine dialysis is completed and the needle is removed, bleeding lasts for too long a time.   °WHAT HAPPENS IF MY VASCULAR ACCESS MALFUNCTIONS? °Your health care provider may order blood work, cultures, or an X-ray test in order to learn what may be wrong with your vascular access. The X-ray test involves the injection of a liquid into the vascular access. The liquid shows up on the X-ray and allows your health care provider to see if there is a blockage in the vascular access.  °Treatment varies depending on the cause of the malfunction:   °· If the vascular access is infected, your health care provider  may prescribe antibiotic medicine to control the infection.   °· If a clot is found in the vascular access, you may need surgery to remove the clot.   °· If a blockage in the vascular access is due to some other cause (such as a kink in a graft), then you will likely need surgery to unblock or replace the graft.   °HOME CARE INSTRUCTIONS: °Follow up with your surgeon or other health care provider if you were instructed to do so. This is very important. Any delay in follow-up could cause permanent dysfunction of the vascular access, which may be dangerous.  °SEEK MEDICAL CARE IF:  °· Fever develops.   °· Swelling and pain around the vascular access gets worse or new pain develops. °· Pain, numbness, or an unusual pale skin color develops in the hand on the side of your vascular access. °SEEK IMMEDIATE MEDICAL CARE IF: °Unusual bleeding develops at the location of the vascular access. °MAKE SURE YOU: °· Understand these instructions. °· Will watch your condition. °· Will get help right away if you are not doing well or get worse. °Document Released: 06/18/2006 Document Revised: 11/30/2013 Document Reviewed: 12/18/2012 °Exit[employer]® Patient Information ©2015 [employer]. This information is not intended to replace advice given to you by your health care provider. Make sure you discuss any questions you have with your health care provider. ° °

## 2014-11-22 NOTE — ED Provider Notes (Signed)
CSN: [id number]     Arrival date & time 11/22/14  1031 History   First MD Initiated Contact with Patient 11/22/14 1035     Chief Complaint  Patient presents with  . Vascular Access Problem    The history is provided by the patient.  Patient presents for acute bleeding episode from left UE fistula This started in the past hour It is improving Improved with pressure Worsened by nothing No fever/vomiting No cp/sob No weakness/dizziness He was able to get part of his dialysis session completed.   He is on ASA, no anticoagulants  Past Medical History  Diagnosis Date  . Hypertension   . Dialysis patient   . Chronic kidney disease   . GERD (gastroesophageal reflux disease)   . Myocardial infarction     lived in [place] maybe 7 years ago  . History of blood transfusion    Past Surgical History  Procedure Laterality Date  . Arteriovenous graft placement    . Small intestine surgery    . Small intestine surgery    . Resection of arteriovenous fistula aneurysm Left 02/05/2013    Procedure: REPAIR OF ANEURYSM OF LEFT ARM ARTERIOVENOUS FISTULA ;  Surgeon: Serafina Mitchell, MD;  Location: [facility] OR;  Service: Vascular;  Laterality: Left;   Family History  Problem Relation Age of Onset  . Hypertension Mother   . Other Mother     varicose veins  . Diabetes Father   . Hypertension Father   . Hypertension Sister   . Other Sister     varicose veins  . Other Brother     varicose veins   History  Substance Use Topics  . Smoking status: Never Smoker   . Smokeless tobacco: Never Used  . Alcohol Use: No    Review of Systems  Constitutional: Negative for fever.  Respiratory: Negative for shortness of breath.   Gastrointestinal: Negative for vomiting.  Skin: Negative for color change.  Neurological: Negative for dizziness.  All other systems reviewed and are negative.     Allergies  Review of patient's allergies indicates no known allergies.  Home Medications   Prior to  Admission medications   Medication Sig Start Date End Date Taking? Authorizing Provider  amLODipine (NORVASC) 10 MG tablet Take 10 mg by mouth at bedtime.   Yes Historical Provider, MD  aspirin EC 81 MG EC tablet Take 1 tablet (81 mg total) by mouth daily. 05/30/13  Yes Charolette Forward, MD  atorvastatin (LIPITOR) 20 MG tablet Take 1 tablet (20 mg total) by mouth daily at 6 PM. 05/30/13  Yes Charolette Forward, MD  labetalol (NORMODYNE) 200 MG tablet Take 200 mg by mouth at bedtime.    Yes Historical Provider, MD  losartan (COZAAR) 100 MG tablet Take 100 mg by mouth at bedtime.   Yes Historical Provider, MD  multivitamin (RENA-VIT) TABS tablet Take 1 tablet by mouth at bedtime.   Yes Historical Provider, MD  nitroGLYCERIN (NITROSTAT) 0.4 MG SL tablet Place 1 tablet (0.4 mg total) under the tongue every 5 (five) minutes x 3 doses as needed for chest pain. 05/30/13  Yes Charolette Forward, MD  pantoprazole (PROTONIX) 40 MG tablet Take 1 tablet (40 mg total) by mouth daily at 6 (six) AM. 05/30/13  Yes Charolette Forward, MD   BP 174/99 mmHg  Pulse 67  Temp(Src) 98.5 F (36.9 C) (Oral)  Resp 17  SpO2 99% Physical Exam  CONSTITUTIONAL: Well developed/well nourished HEAD: Normocephalic/atraumatic ENMT: Mucous membranes moist NECK: supple no  meningeal signs SPINE/BACK:entire spine nontender CV: S1/S2 noted, no murmurs/rubs/gallops noted LUNGS: Lungs are clear to auscultation bilaterally, no apparent distress ABDOMEN: soft, nontender NEURO: Pt is awake/alert/appropriate, moves all extremitiesx4.  No facial droop.   EXTREMITIES: pulses normal/equal, full ROM, fistula noted to left UE, thrill noted and tourniquet noted over arm. SKIN: warm, color normal PSYCH: no abnormalities of mood noted, alert and oriented to situation  ED Course  Procedures  Labs Review Labs Reviewed  I-STAT CHEM 8, ED - Abnormal; Notable for the following:    Chloride 94 (*)    BUN 32 (*)    Creatinine, Ser 13.00 (*)    Calcium, Ion  0.98 (*)    All other components within normal limits   11:31 AM Tourniquet removed Pt tolerated well No active bleeding at this time  12:42 PM Pt stable No active bleeding He is requesting d/c home I advised him I would like to monitor further, but he wants to go Discussed appropriate use of pressure if he has any further bleeding episodes  BP 166/82 mmHg  Pulse 67  Temp(Src) 98.5 F (36.9 C) (Oral)  Resp 17  SpO2 98%    MDM   Final diagnoses:  Complication from renal dialysis device    Nursing notes including past medical history and social history reviewed and considered in documentation Labs/vital reviewed myself and considered during evaluation     Ripley Fraise, MD 11/22/14 1243

## 2014-11-23 ENCOUNTER — Ambulatory Visit (HOSPITAL_COMMUNITY)
Admission: RE | Admit: 2014-11-23 | Discharge: 2014-11-23 | Disposition: A | Discharge status: Home / Self Care | Payer: Medicare Other | Source: Ambulatory Visit | Attending: Cardiology | Admitting: Cardiology

## 2014-11-23 DIAGNOSIS — R0989 Other specified symptoms and signs involving the circulatory and respiratory systems: Secondary | ICD-10-CM | POA: Diagnosis not present

## 2014-11-23 NOTE — Progress Notes (Signed)
*  PRELIMINARY RESULTS* Vascular Ultrasound Carotid Duplex (Doppler) has been completed.   Findings suggest 1-39% internal carotid artery stenosis bilaterally. Vertebral arteries are patent with antegrade flow.   11/23/2014 12:25 PM Maudry Mayhew, RVT, RDCS, RDMS

## 2014-11-24 DIAGNOSIS — N186 End stage renal disease: Secondary | ICD-10-CM | POA: Diagnosis not present

## 2014-11-24 DIAGNOSIS — N2581 Secondary hyperparathyroidism of renal origin: Secondary | ICD-10-CM | POA: Diagnosis not present

## 2014-11-24 DIAGNOSIS — D509 Iron deficiency anemia, unspecified: Secondary | ICD-10-CM | POA: Diagnosis not present

## 2014-11-24 DIAGNOSIS — E119 Type 2 diabetes mellitus without complications: Secondary | ICD-10-CM | POA: Diagnosis not present

## 2014-11-26 DIAGNOSIS — N2581 Secondary hyperparathyroidism of renal origin: Secondary | ICD-10-CM | POA: Diagnosis not present

## 2014-11-26 DIAGNOSIS — E119 Type 2 diabetes mellitus without complications: Secondary | ICD-10-CM | POA: Diagnosis not present

## 2014-11-26 DIAGNOSIS — D509 Iron deficiency anemia, unspecified: Secondary | ICD-10-CM | POA: Diagnosis not present

## 2014-11-26 DIAGNOSIS — N186 End stage renal disease: Secondary | ICD-10-CM | POA: Diagnosis not present

## 2014-11-27 DIAGNOSIS — I120 Hypertensive chronic kidney disease with stage 5 chronic kidney disease or end stage renal disease: Secondary | ICD-10-CM | POA: Diagnosis not present

## 2014-11-27 DIAGNOSIS — Z992 Dependence on renal dialysis: Secondary | ICD-10-CM | POA: Diagnosis not present

## 2014-11-27 DIAGNOSIS — N186 End stage renal disease: Secondary | ICD-10-CM | POA: Diagnosis not present

## 2014-11-29 DIAGNOSIS — N2581 Secondary hyperparathyroidism of renal origin: Secondary | ICD-10-CM | POA: Diagnosis not present

## 2014-11-29 DIAGNOSIS — D509 Iron deficiency anemia, unspecified: Secondary | ICD-10-CM | POA: Diagnosis not present

## 2014-11-29 DIAGNOSIS — N186 End stage renal disease: Secondary | ICD-10-CM | POA: Diagnosis not present

## 2014-11-29 DIAGNOSIS — E119 Type 2 diabetes mellitus without complications: Secondary | ICD-10-CM | POA: Diagnosis not present

## 2014-12-01 DIAGNOSIS — E119 Type 2 diabetes mellitus without complications: Secondary | ICD-10-CM | POA: Diagnosis not present

## 2014-12-01 DIAGNOSIS — N2581 Secondary hyperparathyroidism of renal origin: Secondary | ICD-10-CM | POA: Diagnosis not present

## 2014-12-01 DIAGNOSIS — D509 Iron deficiency anemia, unspecified: Secondary | ICD-10-CM | POA: Diagnosis not present

## 2014-12-01 DIAGNOSIS — N186 End stage renal disease: Secondary | ICD-10-CM | POA: Diagnosis not present

## 2014-12-02 DIAGNOSIS — D509 Iron deficiency anemia, unspecified: Secondary | ICD-10-CM | POA: Diagnosis not present

## 2014-12-02 DIAGNOSIS — N2581 Secondary hyperparathyroidism of renal origin: Secondary | ICD-10-CM | POA: Diagnosis not present

## 2014-12-02 DIAGNOSIS — E119 Type 2 diabetes mellitus without complications: Secondary | ICD-10-CM | POA: Diagnosis not present

## 2014-12-02 DIAGNOSIS — N186 End stage renal disease: Secondary | ICD-10-CM | POA: Diagnosis not present

## 2014-12-06 DIAGNOSIS — N186 End stage renal disease: Secondary | ICD-10-CM | POA: Diagnosis not present

## 2014-12-06 DIAGNOSIS — E119 Type 2 diabetes mellitus without complications: Secondary | ICD-10-CM | POA: Diagnosis not present

## 2014-12-06 DIAGNOSIS — N2581 Secondary hyperparathyroidism of renal origin: Secondary | ICD-10-CM | POA: Diagnosis not present

## 2014-12-06 DIAGNOSIS — D509 Iron deficiency anemia, unspecified: Secondary | ICD-10-CM | POA: Diagnosis not present

## 2014-12-08 DIAGNOSIS — N186 End stage renal disease: Secondary | ICD-10-CM | POA: Diagnosis not present

## 2014-12-08 DIAGNOSIS — E119 Type 2 diabetes mellitus without complications: Secondary | ICD-10-CM | POA: Diagnosis not present

## 2014-12-08 DIAGNOSIS — N2581 Secondary hyperparathyroidism of renal origin: Secondary | ICD-10-CM | POA: Diagnosis not present

## 2014-12-08 DIAGNOSIS — D509 Iron deficiency anemia, unspecified: Secondary | ICD-10-CM | POA: Diagnosis not present

## 2014-12-13 DIAGNOSIS — D509 Iron deficiency anemia, unspecified: Secondary | ICD-10-CM | POA: Diagnosis not present

## 2014-12-13 DIAGNOSIS — N2581 Secondary hyperparathyroidism of renal origin: Secondary | ICD-10-CM | POA: Diagnosis not present

## 2014-12-13 DIAGNOSIS — E119 Type 2 diabetes mellitus without complications: Secondary | ICD-10-CM | POA: Diagnosis not present

## 2014-12-13 DIAGNOSIS — N186 End stage renal disease: Secondary | ICD-10-CM | POA: Diagnosis not present

## 2014-12-15 DIAGNOSIS — E119 Type 2 diabetes mellitus without complications: Secondary | ICD-10-CM | POA: Diagnosis not present

## 2014-12-15 DIAGNOSIS — D509 Iron deficiency anemia, unspecified: Secondary | ICD-10-CM | POA: Diagnosis not present

## 2014-12-15 DIAGNOSIS — N186 End stage renal disease: Secondary | ICD-10-CM | POA: Diagnosis not present

## 2014-12-15 DIAGNOSIS — N2581 Secondary hyperparathyroidism of renal origin: Secondary | ICD-10-CM | POA: Diagnosis not present

## 2014-12-17 DIAGNOSIS — N186 End stage renal disease: Secondary | ICD-10-CM | POA: Diagnosis not present

## 2014-12-17 DIAGNOSIS — N2581 Secondary hyperparathyroidism of renal origin: Secondary | ICD-10-CM | POA: Diagnosis not present

## 2014-12-17 DIAGNOSIS — E119 Type 2 diabetes mellitus without complications: Secondary | ICD-10-CM | POA: Diagnosis not present

## 2014-12-17 DIAGNOSIS — D509 Iron deficiency anemia, unspecified: Secondary | ICD-10-CM | POA: Diagnosis not present

## 2014-12-20 DIAGNOSIS — N186 End stage renal disease: Secondary | ICD-10-CM | POA: Diagnosis not present

## 2014-12-20 DIAGNOSIS — N2581 Secondary hyperparathyroidism of renal origin: Secondary | ICD-10-CM | POA: Diagnosis not present

## 2014-12-20 DIAGNOSIS — E119 Type 2 diabetes mellitus without complications: Secondary | ICD-10-CM | POA: Diagnosis not present

## 2014-12-20 DIAGNOSIS — D509 Iron deficiency anemia, unspecified: Secondary | ICD-10-CM | POA: Diagnosis not present

## 2014-12-22 DIAGNOSIS — E119 Type 2 diabetes mellitus without complications: Secondary | ICD-10-CM | POA: Diagnosis not present

## 2014-12-22 DIAGNOSIS — D509 Iron deficiency anemia, unspecified: Secondary | ICD-10-CM | POA: Diagnosis not present

## 2014-12-22 DIAGNOSIS — N186 End stage renal disease: Secondary | ICD-10-CM | POA: Diagnosis not present

## 2014-12-22 DIAGNOSIS — N2581 Secondary hyperparathyroidism of renal origin: Secondary | ICD-10-CM | POA: Diagnosis not present

## 2014-12-24 DIAGNOSIS — E119 Type 2 diabetes mellitus without complications: Secondary | ICD-10-CM | POA: Diagnosis not present

## 2014-12-24 DIAGNOSIS — N186 End stage renal disease: Secondary | ICD-10-CM | POA: Diagnosis not present

## 2014-12-24 DIAGNOSIS — N2581 Secondary hyperparathyroidism of renal origin: Secondary | ICD-10-CM | POA: Diagnosis not present

## 2014-12-24 DIAGNOSIS — D509 Iron deficiency anemia, unspecified: Secondary | ICD-10-CM | POA: Diagnosis not present

## 2014-12-27 DIAGNOSIS — N186 End stage renal disease: Secondary | ICD-10-CM | POA: Diagnosis not present

## 2014-12-27 DIAGNOSIS — E119 Type 2 diabetes mellitus without complications: Secondary | ICD-10-CM | POA: Diagnosis not present

## 2014-12-27 DIAGNOSIS — D509 Iron deficiency anemia, unspecified: Secondary | ICD-10-CM | POA: Diagnosis not present

## 2014-12-27 DIAGNOSIS — N2581 Secondary hyperparathyroidism of renal origin: Secondary | ICD-10-CM | POA: Diagnosis not present

## 2014-12-28 DIAGNOSIS — N186 End stage renal disease: Secondary | ICD-10-CM | POA: Diagnosis not present

## 2014-12-28 DIAGNOSIS — I120 Hypertensive chronic kidney disease with stage 5 chronic kidney disease or end stage renal disease: Secondary | ICD-10-CM | POA: Diagnosis not present

## 2014-12-28 DIAGNOSIS — Z992 Dependence on renal dialysis: Secondary | ICD-10-CM | POA: Diagnosis not present

## 2014-12-29 DIAGNOSIS — D509 Iron deficiency anemia, unspecified: Secondary | ICD-10-CM | POA: Diagnosis not present

## 2014-12-29 DIAGNOSIS — E119 Type 2 diabetes mellitus without complications: Secondary | ICD-10-CM | POA: Diagnosis not present

## 2014-12-29 DIAGNOSIS — N2581 Secondary hyperparathyroidism of renal origin: Secondary | ICD-10-CM | POA: Diagnosis not present

## 2014-12-29 DIAGNOSIS — N186 End stage renal disease: Secondary | ICD-10-CM | POA: Diagnosis not present

## 2014-12-31 DIAGNOSIS — N186 End stage renal disease: Secondary | ICD-10-CM | POA: Diagnosis not present

## 2014-12-31 DIAGNOSIS — E119 Type 2 diabetes mellitus without complications: Secondary | ICD-10-CM | POA: Diagnosis not present

## 2014-12-31 DIAGNOSIS — D509 Iron deficiency anemia, unspecified: Secondary | ICD-10-CM | POA: Diagnosis not present

## 2014-12-31 DIAGNOSIS — N2581 Secondary hyperparathyroidism of renal origin: Secondary | ICD-10-CM | POA: Diagnosis not present

## 2015-01-03 DIAGNOSIS — N186 End stage renal disease: Secondary | ICD-10-CM | POA: Diagnosis not present

## 2015-01-03 DIAGNOSIS — D509 Iron deficiency anemia, unspecified: Secondary | ICD-10-CM | POA: Diagnosis not present

## 2015-01-03 DIAGNOSIS — E119 Type 2 diabetes mellitus without complications: Secondary | ICD-10-CM | POA: Diagnosis not present

## 2015-01-03 DIAGNOSIS — N2581 Secondary hyperparathyroidism of renal origin: Secondary | ICD-10-CM | POA: Diagnosis not present

## 2015-01-05 DIAGNOSIS — N186 End stage renal disease: Secondary | ICD-10-CM | POA: Diagnosis not present

## 2015-01-05 DIAGNOSIS — N2581 Secondary hyperparathyroidism of renal origin: Secondary | ICD-10-CM | POA: Diagnosis not present

## 2015-01-05 DIAGNOSIS — D509 Iron deficiency anemia, unspecified: Secondary | ICD-10-CM | POA: Diagnosis not present

## 2015-01-05 DIAGNOSIS — E119 Type 2 diabetes mellitus without complications: Secondary | ICD-10-CM | POA: Diagnosis not present

## 2015-01-07 DIAGNOSIS — N2581 Secondary hyperparathyroidism of renal origin: Secondary | ICD-10-CM | POA: Diagnosis not present

## 2015-01-07 DIAGNOSIS — D509 Iron deficiency anemia, unspecified: Secondary | ICD-10-CM | POA: Diagnosis not present

## 2015-01-07 DIAGNOSIS — E119 Type 2 diabetes mellitus without complications: Secondary | ICD-10-CM | POA: Diagnosis not present

## 2015-01-07 DIAGNOSIS — N186 End stage renal disease: Secondary | ICD-10-CM | POA: Diagnosis not present

## 2015-01-10 DIAGNOSIS — N186 End stage renal disease: Secondary | ICD-10-CM | POA: Diagnosis not present

## 2015-01-10 DIAGNOSIS — D509 Iron deficiency anemia, unspecified: Secondary | ICD-10-CM | POA: Diagnosis not present

## 2015-01-10 DIAGNOSIS — E119 Type 2 diabetes mellitus without complications: Secondary | ICD-10-CM | POA: Diagnosis not present

## 2015-01-10 DIAGNOSIS — N2581 Secondary hyperparathyroidism of renal origin: Secondary | ICD-10-CM | POA: Diagnosis not present

## 2015-01-12 DIAGNOSIS — N186 End stage renal disease: Secondary | ICD-10-CM | POA: Diagnosis not present

## 2015-01-12 DIAGNOSIS — E119 Type 2 diabetes mellitus without complications: Secondary | ICD-10-CM | POA: Diagnosis not present

## 2015-01-12 DIAGNOSIS — N2581 Secondary hyperparathyroidism of renal origin: Secondary | ICD-10-CM | POA: Diagnosis not present

## 2015-01-12 DIAGNOSIS — D509 Iron deficiency anemia, unspecified: Secondary | ICD-10-CM | POA: Diagnosis not present

## 2015-01-14 DIAGNOSIS — D509 Iron deficiency anemia, unspecified: Secondary | ICD-10-CM | POA: Diagnosis not present

## 2015-01-14 DIAGNOSIS — E119 Type 2 diabetes mellitus without complications: Secondary | ICD-10-CM | POA: Diagnosis not present

## 2015-01-14 DIAGNOSIS — N186 End stage renal disease: Secondary | ICD-10-CM | POA: Diagnosis not present

## 2015-01-14 DIAGNOSIS — N2581 Secondary hyperparathyroidism of renal origin: Secondary | ICD-10-CM | POA: Diagnosis not present

## 2015-01-17 DIAGNOSIS — D509 Iron deficiency anemia, unspecified: Secondary | ICD-10-CM | POA: Diagnosis not present

## 2015-01-17 DIAGNOSIS — N186 End stage renal disease: Secondary | ICD-10-CM | POA: Diagnosis not present

## 2015-01-17 DIAGNOSIS — N2581 Secondary hyperparathyroidism of renal origin: Secondary | ICD-10-CM | POA: Diagnosis not present

## 2015-01-17 DIAGNOSIS — E119 Type 2 diabetes mellitus without complications: Secondary | ICD-10-CM | POA: Diagnosis not present

## 2015-01-19 DIAGNOSIS — N2581 Secondary hyperparathyroidism of renal origin: Secondary | ICD-10-CM | POA: Diagnosis not present

## 2015-01-19 DIAGNOSIS — E119 Type 2 diabetes mellitus without complications: Secondary | ICD-10-CM | POA: Diagnosis not present

## 2015-01-19 DIAGNOSIS — N186 End stage renal disease: Secondary | ICD-10-CM | POA: Diagnosis not present

## 2015-01-19 DIAGNOSIS — D509 Iron deficiency anemia, unspecified: Secondary | ICD-10-CM | POA: Diagnosis not present

## 2015-01-21 DIAGNOSIS — N186 End stage renal disease: Secondary | ICD-10-CM | POA: Diagnosis not present

## 2015-01-21 DIAGNOSIS — N2581 Secondary hyperparathyroidism of renal origin: Secondary | ICD-10-CM | POA: Diagnosis not present

## 2015-01-21 DIAGNOSIS — D509 Iron deficiency anemia, unspecified: Secondary | ICD-10-CM | POA: Diagnosis not present

## 2015-01-21 DIAGNOSIS — E119 Type 2 diabetes mellitus without complications: Secondary | ICD-10-CM | POA: Diagnosis not present

## 2015-01-24 DIAGNOSIS — N186 End stage renal disease: Secondary | ICD-10-CM | POA: Diagnosis not present

## 2015-01-24 DIAGNOSIS — E119 Type 2 diabetes mellitus without complications: Secondary | ICD-10-CM | POA: Diagnosis not present

## 2015-01-24 DIAGNOSIS — D509 Iron deficiency anemia, unspecified: Secondary | ICD-10-CM | POA: Diagnosis not present

## 2015-01-24 DIAGNOSIS — N2581 Secondary hyperparathyroidism of renal origin: Secondary | ICD-10-CM | POA: Diagnosis not present

## 2015-01-26 DIAGNOSIS — E119 Type 2 diabetes mellitus without complications: Secondary | ICD-10-CM | POA: Diagnosis not present

## 2015-01-26 DIAGNOSIS — D509 Iron deficiency anemia, unspecified: Secondary | ICD-10-CM | POA: Diagnosis not present

## 2015-01-26 DIAGNOSIS — N2581 Secondary hyperparathyroidism of renal origin: Secondary | ICD-10-CM | POA: Diagnosis not present

## 2015-01-26 DIAGNOSIS — N186 End stage renal disease: Secondary | ICD-10-CM | POA: Diagnosis not present

## 2015-01-27 DIAGNOSIS — N186 End stage renal disease: Secondary | ICD-10-CM | POA: Diagnosis not present

## 2015-01-27 DIAGNOSIS — I120 Hypertensive chronic kidney disease with stage 5 chronic kidney disease or end stage renal disease: Secondary | ICD-10-CM | POA: Diagnosis not present

## 2015-01-27 DIAGNOSIS — Z992 Dependence on renal dialysis: Secondary | ICD-10-CM | POA: Diagnosis not present

## 2015-01-28 DIAGNOSIS — D509 Iron deficiency anemia, unspecified: Secondary | ICD-10-CM | POA: Diagnosis not present

## 2015-01-28 DIAGNOSIS — E119 Type 2 diabetes mellitus without complications: Secondary | ICD-10-CM | POA: Diagnosis not present

## 2015-01-28 DIAGNOSIS — N2581 Secondary hyperparathyroidism of renal origin: Secondary | ICD-10-CM | POA: Diagnosis not present

## 2015-01-28 DIAGNOSIS — N186 End stage renal disease: Secondary | ICD-10-CM | POA: Diagnosis not present

## 2015-01-31 DIAGNOSIS — D509 Iron deficiency anemia, unspecified: Secondary | ICD-10-CM | POA: Diagnosis not present

## 2015-01-31 DIAGNOSIS — N186 End stage renal disease: Secondary | ICD-10-CM | POA: Diagnosis not present

## 2015-01-31 DIAGNOSIS — N2581 Secondary hyperparathyroidism of renal origin: Secondary | ICD-10-CM | POA: Diagnosis not present

## 2015-01-31 DIAGNOSIS — E119 Type 2 diabetes mellitus without complications: Secondary | ICD-10-CM | POA: Diagnosis not present

## 2015-02-02 DIAGNOSIS — N2581 Secondary hyperparathyroidism of renal origin: Secondary | ICD-10-CM | POA: Diagnosis not present

## 2015-02-02 DIAGNOSIS — E119 Type 2 diabetes mellitus without complications: Secondary | ICD-10-CM | POA: Diagnosis not present

## 2015-02-02 DIAGNOSIS — N186 End stage renal disease: Secondary | ICD-10-CM | POA: Diagnosis not present

## 2015-02-02 DIAGNOSIS — D509 Iron deficiency anemia, unspecified: Secondary | ICD-10-CM | POA: Diagnosis not present

## 2015-02-04 DIAGNOSIS — E119 Type 2 diabetes mellitus without complications: Secondary | ICD-10-CM | POA: Diagnosis not present

## 2015-02-04 DIAGNOSIS — D509 Iron deficiency anemia, unspecified: Secondary | ICD-10-CM | POA: Diagnosis not present

## 2015-02-04 DIAGNOSIS — N2581 Secondary hyperparathyroidism of renal origin: Secondary | ICD-10-CM | POA: Diagnosis not present

## 2015-02-04 DIAGNOSIS — N186 End stage renal disease: Secondary | ICD-10-CM | POA: Diagnosis not present

## 2015-02-07 DIAGNOSIS — E119 Type 2 diabetes mellitus without complications: Secondary | ICD-10-CM | POA: Diagnosis not present

## 2015-02-07 DIAGNOSIS — N186 End stage renal disease: Secondary | ICD-10-CM | POA: Diagnosis not present

## 2015-02-07 DIAGNOSIS — D509 Iron deficiency anemia, unspecified: Secondary | ICD-10-CM | POA: Diagnosis not present

## 2015-02-07 DIAGNOSIS — N2581 Secondary hyperparathyroidism of renal origin: Secondary | ICD-10-CM | POA: Diagnosis not present

## 2015-02-09 DIAGNOSIS — N2581 Secondary hyperparathyroidism of renal origin: Secondary | ICD-10-CM | POA: Diagnosis not present

## 2015-02-09 DIAGNOSIS — D509 Iron deficiency anemia, unspecified: Secondary | ICD-10-CM | POA: Diagnosis not present

## 2015-02-09 DIAGNOSIS — E119 Type 2 diabetes mellitus without complications: Secondary | ICD-10-CM | POA: Diagnosis not present

## 2015-02-09 DIAGNOSIS — N186 End stage renal disease: Secondary | ICD-10-CM | POA: Diagnosis not present

## 2015-02-11 DIAGNOSIS — E119 Type 2 diabetes mellitus without complications: Secondary | ICD-10-CM | POA: Diagnosis not present

## 2015-02-11 DIAGNOSIS — N186 End stage renal disease: Secondary | ICD-10-CM | POA: Diagnosis not present

## 2015-02-11 DIAGNOSIS — D509 Iron deficiency anemia, unspecified: Secondary | ICD-10-CM | POA: Diagnosis not present

## 2015-02-11 DIAGNOSIS — N2581 Secondary hyperparathyroidism of renal origin: Secondary | ICD-10-CM | POA: Diagnosis not present

## 2015-02-14 DIAGNOSIS — N2581 Secondary hyperparathyroidism of renal origin: Secondary | ICD-10-CM | POA: Diagnosis not present

## 2015-02-14 DIAGNOSIS — E119 Type 2 diabetes mellitus without complications: Secondary | ICD-10-CM | POA: Diagnosis not present

## 2015-02-14 DIAGNOSIS — N186 End stage renal disease: Secondary | ICD-10-CM | POA: Diagnosis not present

## 2015-02-14 DIAGNOSIS — D509 Iron deficiency anemia, unspecified: Secondary | ICD-10-CM | POA: Diagnosis not present

## 2015-02-16 DIAGNOSIS — N186 End stage renal disease: Secondary | ICD-10-CM | POA: Diagnosis not present

## 2015-02-16 DIAGNOSIS — D509 Iron deficiency anemia, unspecified: Secondary | ICD-10-CM | POA: Diagnosis not present

## 2015-02-16 DIAGNOSIS — E119 Type 2 diabetes mellitus without complications: Secondary | ICD-10-CM | POA: Diagnosis not present

## 2015-02-16 DIAGNOSIS — N2581 Secondary hyperparathyroidism of renal origin: Secondary | ICD-10-CM | POA: Diagnosis not present

## 2015-02-17 DIAGNOSIS — E669 Obesity, unspecified: Secondary | ICD-10-CM | POA: Diagnosis not present

## 2015-02-17 DIAGNOSIS — E785 Hyperlipidemia, unspecified: Secondary | ICD-10-CM | POA: Diagnosis not present

## 2015-02-17 DIAGNOSIS — Z992 Dependence on renal dialysis: Secondary | ICD-10-CM | POA: Diagnosis not present

## 2015-02-17 DIAGNOSIS — R7309 Other abnormal glucose: Secondary | ICD-10-CM | POA: Diagnosis not present

## 2015-02-17 DIAGNOSIS — I10 Essential (primary) hypertension: Secondary | ICD-10-CM | POA: Diagnosis not present

## 2015-02-17 DIAGNOSIS — N186 End stage renal disease: Secondary | ICD-10-CM | POA: Diagnosis not present

## 2015-02-17 DIAGNOSIS — D649 Anemia, unspecified: Secondary | ICD-10-CM | POA: Diagnosis not present

## 2015-02-17 DIAGNOSIS — I2510 Atherosclerotic heart disease of native coronary artery without angina pectoris: Secondary | ICD-10-CM | POA: Diagnosis not present

## 2015-02-18 DIAGNOSIS — N2581 Secondary hyperparathyroidism of renal origin: Secondary | ICD-10-CM | POA: Diagnosis not present

## 2015-02-18 DIAGNOSIS — N186 End stage renal disease: Secondary | ICD-10-CM | POA: Diagnosis not present

## 2015-02-18 DIAGNOSIS — E119 Type 2 diabetes mellitus without complications: Secondary | ICD-10-CM | POA: Diagnosis not present

## 2015-02-18 DIAGNOSIS — D509 Iron deficiency anemia, unspecified: Secondary | ICD-10-CM | POA: Diagnosis not present

## 2015-02-21 DIAGNOSIS — N2581 Secondary hyperparathyroidism of renal origin: Secondary | ICD-10-CM | POA: Diagnosis not present

## 2015-02-21 DIAGNOSIS — D509 Iron deficiency anemia, unspecified: Secondary | ICD-10-CM | POA: Diagnosis not present

## 2015-02-21 DIAGNOSIS — N186 End stage renal disease: Secondary | ICD-10-CM | POA: Diagnosis not present

## 2015-02-21 DIAGNOSIS — E119 Type 2 diabetes mellitus without complications: Secondary | ICD-10-CM | POA: Diagnosis not present

## 2015-02-23 DIAGNOSIS — N2581 Secondary hyperparathyroidism of renal origin: Secondary | ICD-10-CM | POA: Diagnosis not present

## 2015-02-23 DIAGNOSIS — N186 End stage renal disease: Secondary | ICD-10-CM | POA: Diagnosis not present

## 2015-02-23 DIAGNOSIS — D509 Iron deficiency anemia, unspecified: Secondary | ICD-10-CM | POA: Diagnosis not present

## 2015-02-23 DIAGNOSIS — E119 Type 2 diabetes mellitus without complications: Secondary | ICD-10-CM | POA: Diagnosis not present

## 2015-02-25 DIAGNOSIS — N2581 Secondary hyperparathyroidism of renal origin: Secondary | ICD-10-CM | POA: Diagnosis not present

## 2015-02-25 DIAGNOSIS — E119 Type 2 diabetes mellitus without complications: Secondary | ICD-10-CM | POA: Diagnosis not present

## 2015-02-25 DIAGNOSIS — D509 Iron deficiency anemia, unspecified: Secondary | ICD-10-CM | POA: Diagnosis not present

## 2015-02-25 DIAGNOSIS — N186 End stage renal disease: Secondary | ICD-10-CM | POA: Diagnosis not present

## 2015-02-27 DIAGNOSIS — Z992 Dependence on renal dialysis: Secondary | ICD-10-CM | POA: Diagnosis not present

## 2015-02-27 DIAGNOSIS — N186 End stage renal disease: Secondary | ICD-10-CM | POA: Diagnosis not present

## 2015-02-27 DIAGNOSIS — I120 Hypertensive chronic kidney disease with stage 5 chronic kidney disease or end stage renal disease: Secondary | ICD-10-CM | POA: Diagnosis not present

## 2015-02-28 DIAGNOSIS — N2581 Secondary hyperparathyroidism of renal origin: Secondary | ICD-10-CM | POA: Diagnosis not present

## 2015-02-28 DIAGNOSIS — D509 Iron deficiency anemia, unspecified: Secondary | ICD-10-CM | POA: Diagnosis not present

## 2015-02-28 DIAGNOSIS — N186 End stage renal disease: Secondary | ICD-10-CM | POA: Diagnosis not present

## 2015-03-02 DIAGNOSIS — N2581 Secondary hyperparathyroidism of renal origin: Secondary | ICD-10-CM | POA: Diagnosis not present

## 2015-03-02 DIAGNOSIS — D509 Iron deficiency anemia, unspecified: Secondary | ICD-10-CM | POA: Diagnosis not present

## 2015-03-02 DIAGNOSIS — N186 End stage renal disease: Secondary | ICD-10-CM | POA: Diagnosis not present

## 2015-03-04 ENCOUNTER — Encounter: Payer: Self-pay | Admitting: Vascular Surgery

## 2015-03-04 ENCOUNTER — Ambulatory Visit (INDEPENDENT_AMBULATORY_CARE_PROVIDER_SITE_OTHER): Payer: Medicare Other | Admitting: Vascular Surgery

## 2015-03-04 ENCOUNTER — Other Ambulatory Visit: Payer: Self-pay

## 2015-03-04 VITALS — BP 159/84 | HR 76 | Temp 98.4°F | Resp 18 | Ht 72.0 in | Wt 227.0 lb

## 2015-03-04 DIAGNOSIS — N186 End stage renal disease: Secondary | ICD-10-CM

## 2015-03-04 DIAGNOSIS — D509 Iron deficiency anemia, unspecified: Secondary | ICD-10-CM | POA: Diagnosis not present

## 2015-03-04 DIAGNOSIS — Z992 Dependence on renal dialysis: Secondary | ICD-10-CM

## 2015-03-04 DIAGNOSIS — N2581 Secondary hyperparathyroidism of renal origin: Secondary | ICD-10-CM | POA: Diagnosis not present

## 2015-03-04 NOTE — Progress Notes (Signed)
Established Dialysis Access  History of Present Illness  Jon [name] is a 59 y.o. (1956/01/06) male who presents for re-evaluation of L upper arm fistula.  This patient has previously had a plication of the left upper arm fistula.  Recently he has had development of ulceration overlying an enlarging pseudoaneurysm.  His nephrologist refers the patient for repair.  He has not had any bleeding complications from this pseudoaneurysm yet.    Past Medical History  Diagnosis Date  . Hypertension   . Dialysis patient   . Chronic kidney disease   . GERD (gastroesophageal reflux disease)   . Myocardial infarction     lived in [place] maybe 7 years ago  . History of blood transfusion     Past Surgical History  Procedure Laterality Date  . Arteriovenous graft placement    . Small intestine surgery    . Small intestine surgery    . Resection of arteriovenous fistula aneurysm Left 02/05/2013    Procedure: REPAIR OF ANEURYSM OF LEFT ARM ARTERIOVENOUS FISTULA ;  Surgeon: Serafina Mitchell, MD;  Location: [facility];  Service: Vascular;  Laterality: Left;    History   Social History  . Marital Status: Single    Spouse Name: N/A  . Number of Children: 4  . Years of Education: N/A   Occupational History  . DISABILITY    Social History Main Topics  . Smoking status: Never Smoker   . Smokeless tobacco: Never Used  . Alcohol Use: No  . Drug Use: No  . Sexual Activity: Not on file   Other Topics Concern  . Not on file   Social History Narrative    Family History  Problem Relation Age of Onset  . Hypertension Mother   . Other Mother     varicose veins  . Diabetes Father   . Hypertension Father   . Hypertension Sister   . Other Sister     varicose veins  . Other Brother     varicose veins     Current Outpatient Prescriptions  Medication Sig Dispense Refill  . amLODipine (NORVASC) 10 MG tablet Take 10 mg by mouth at bedtime.    Marland Kitchen aspirin EC 81 MG EC tablet Take 1 tablet (81 mg  total) by mouth daily. 30 tablet 3  . atorvastatin (LIPITOR) 20 MG tablet Take 1 tablet (20 mg total) by mouth daily at 6 PM. 30 tablet 3  . labetalol (NORMODYNE) 200 MG tablet Take 200 mg by mouth at bedtime.     Marland Kitchen losartan (COZAAR) 100 MG tablet Take 100 mg by mouth at bedtime.    . multivitamin (RENA-VIT) TABS tablet Take 1 tablet by mouth at bedtime.    . nitroGLYCERIN (NITROSTAT) 0.4 MG SL tablet Place 1 tablet (0.4 mg total) under the tongue every 5 (five) minutes x 3 doses as needed for chest pain. 25 tablet 12  . pantoprazole (PROTONIX) 40 MG tablet Take 1 tablet (40 mg total) by mouth daily at 6 (six) AM. 30 tablet 3   No current facility-administered medications for this visit.     No Known Allergies   REVIEW OF SYSTEMS:  (Positives checked otherwise negative)  CARDIOVASCULAR:   [ ]  chest pain,  [ ]  chest pressure,  [ ]  palpitations,  [ ]  shortness of breath when laying flat,  [ ]  shortness of breath with exertion,   [ ]  pain in feet when walking,  [ ]  pain in feet when laying flat, [ ]   history of blood clot in veins (DVT),  [ ]  history of phlebitis,  [ ]  swelling in legs,  [ ]  varicose veins  PULMONARY:   [ ]  productive cough,  [ ]  asthma,  [ ]  wheezing  NEUROLOGIC:   [ ]  weakness in arms or legs,  [ ]  numbness in arms or legs,  [ ]  difficulty speaking or slurred speech,  [ ]  temporary loss of vision in one eye,  [ ]  dizziness  HEMATOLOGIC:   [ ]  bleeding problems,  [ ]  problems with blood clotting too easily  MUSCULOSKEL:   [ ]  joint pain, [ ]  joint swelling  GASTROINTEST:   [ ]  vomiting blood,  [ ]  blood in stool     GENITOURINARY:   [ ]  burning with urination,  [ ]  blood in urine [x]  ESRD-HD: M-W-F  PSYCHIATRIC:   [ ]  history of major depression  INTEGUMENTARY:   [ ]  rashes,  [ ]  ulcers  CONSTITUTIONAL:   [ ]  fever,  [ ]  chills    Physical Examination  Filed Vitals:   03/04/15 1301 03/04/15 1304  BP: 157/90 159/84  Pulse: 76    Temp: 98.4 F (36.9 C)   TempSrc: Oral   Resp: 18   Height: 6' (1.829 m)   Weight: 227 lb (102.967 kg)   SpO2: 100%    Body mass index is 30.78 kg/(m^2).  General: A&O x 3, WD, WN   Pulmonary: Sym exp, good air movt, CTAB, no rales, rhonchi, & wheezing  Cardiac: RRR, Nl S1, S2, no Murmurs, rubs or gallops  Vascular: Vessel Right Left  Radial Palpable Faintly Palpable  Ulnar Not Palpable Faintly Palpable  Brachial Palpable Palpable   Gastrointestinal: soft, NTND, no G/R, bo HSM, no masses, no CVAT B  Musculoskeletal: M/S 5/5 throughout , Extremities without  ischemic changes , palpable thrill in access in L upper arm, + bruit in access, large aneurysm L BC AVF with pseudoaneurysm with ulcerated skin overlying the fistula.  No frank bleeding at this point  Neurologic: Pain and light touch intact in extremities , Motor exam as listed above  Medical Decision Making  Jon [name] is a 59 y.o. 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  I had an extensive discussion with this patient in regards to the nature of access surgery, including risk, benefits, and alternatives.    The patient is aware that the risks of access surgery include but are not limited to: bleeding, infection, steal syndrome, nerve damage, ischemic monomelic neuropathy, failure of access to mature, and possible need for additional access procedures in the future.  He is aware that intervention on this fistula might lead to thrombosis and that ligation of the fistula maybe necessary if the fistula wall cannot be repaired.  The patient has agreed to proceed with the above procedure which will be scheduled 9 AUG 16 with Dr. Scot Dock.  I gave him strict instructions in regards to possible bleeding from L BC AVF.  He knows to call 911 and come to [facility] in the event  of bleeding.   Adele Barthel, MD [facility] Office: [phone] Pager: [phone]  03/04/2015, 1:21 PM

## 2015-03-07 ENCOUNTER — Encounter (HOSPITAL_COMMUNITY): Payer: Self-pay | Admitting: *Deleted

## 2015-03-07 DIAGNOSIS — N186 End stage renal disease: Secondary | ICD-10-CM | POA: Diagnosis not present

## 2015-03-07 DIAGNOSIS — N2581 Secondary hyperparathyroidism of renal origin: Secondary | ICD-10-CM | POA: Diagnosis not present

## 2015-03-07 DIAGNOSIS — D509 Iron deficiency anemia, unspecified: Secondary | ICD-10-CM | POA: Diagnosis not present

## 2015-03-08 ENCOUNTER — Encounter (HOSPITAL_COMMUNITY)
Admission: RE | Disposition: A | Discharge status: Home / Self Care | Payer: Self-pay | Source: Ambulatory Visit | Attending: Vascular Surgery

## 2015-03-08 ENCOUNTER — Ambulatory Visit (HOSPITAL_COMMUNITY): Payer: Medicare Other | Admitting: Anesthesiology

## 2015-03-08 ENCOUNTER — Encounter (HOSPITAL_COMMUNITY): Payer: Self-pay | Admitting: *Deleted

## 2015-03-08 ENCOUNTER — Ambulatory Visit (HOSPITAL_COMMUNITY)
Admission: RE | Admit: 2015-03-08 | Discharge: 2015-03-08 | Disposition: A | Discharge status: Home / Self Care | Payer: Medicare Other | Source: Ambulatory Visit | Attending: Vascular Surgery | Admitting: Vascular Surgery

## 2015-03-08 DIAGNOSIS — T82868A Thrombosis of vascular prosthetic devices, implants and grafts, initial encounter: Secondary | ICD-10-CM | POA: Diagnosis not present

## 2015-03-08 DIAGNOSIS — I120 Hypertensive chronic kidney disease with stage 5 chronic kidney disease or end stage renal disease: Secondary | ICD-10-CM | POA: Diagnosis not present

## 2015-03-08 DIAGNOSIS — I252 Old myocardial infarction: Secondary | ICD-10-CM | POA: Diagnosis not present

## 2015-03-08 DIAGNOSIS — T82898A Other specified complication of vascular prosthetic devices, implants and grafts, initial encounter: Secondary | ICD-10-CM | POA: Insufficient documentation

## 2015-03-08 DIAGNOSIS — N186 End stage renal disease: Secondary | ICD-10-CM | POA: Diagnosis not present

## 2015-03-08 DIAGNOSIS — Z992 Dependence on renal dialysis: Secondary | ICD-10-CM | POA: Insufficient documentation

## 2015-03-08 DIAGNOSIS — I729 Aneurysm of unspecified site: Secondary | ICD-10-CM | POA: Diagnosis not present

## 2015-03-08 DIAGNOSIS — K219 Gastro-esophageal reflux disease without esophagitis: Secondary | ICD-10-CM | POA: Diagnosis not present

## 2015-03-08 HISTORY — PX: RESECTION OF ARTERIOVENOUS FISTULA ANEURYSM: SHX6070

## 2015-03-08 LAB — POCT I-STAT 4, (NA,K, GLUC, HGB,HCT)
Glucose, Bld: 84 mg/dL (ref 65–99)
HCT: 42 % (ref 39.0–52.0)
Hemoglobin: 14.3 g/dL (ref 13.0–17.0)
Potassium: 4.6 mmol/L (ref 3.5–5.1)
Sodium: 137 mmol/L (ref 135–145)

## 2015-03-08 SURGERY — RESECTION OF ARTERIOVENOUS FISTULA ANEURYSM
Anesthesia: Monitor Anesthesia Care | Site: Arm Upper | Laterality: Left

## 2015-03-08 MED ORDER — ONDANSETRON HCL 4 MG/2ML IJ SOLN
INTRAMUSCULAR | Status: AC
  Filled 2015-03-08: qty 6

## 2015-03-08 MED ORDER — LIDOCAINE-EPINEPHRINE (PF) 1 %-1:200000 IJ SOLN
INTRAMUSCULAR | Status: AC
  Filled 2015-03-08: qty 30

## 2015-03-08 MED ORDER — SODIUM CHLORIDE 0.9 % IV SOLN
INTRAVENOUS | Status: DC
  Administered 2015-03-08: 12:00:00 via INTRAVENOUS

## 2015-03-08 MED ORDER — ONDANSETRON HCL 4 MG/2ML IJ SOLN
INTRAMUSCULAR | Status: DC | PRN
  Administered 2015-03-08: 4 mg via INTRAVENOUS

## 2015-03-08 MED ORDER — DEXTROSE 5 % IV SOLN
1.5000 g | INTRAVENOUS | Status: AC
  Administered 2015-03-08: 1.5 g via INTRAVENOUS
  Filled 2015-03-08: qty 1.5

## 2015-03-08 MED ORDER — CHLORHEXIDINE GLUCONATE CLOTH 2 % EX PADS: 6.0000 | MEDICATED_PAD | Freq: Once | CUTANEOUS | Status: DC

## 2015-03-08 MED ORDER — LIDOCAINE HCL (PF) 1 % IJ SOLN
INTRAMUSCULAR | Status: AC
  Filled 2015-03-08: qty 30

## 2015-03-08 MED ORDER — PROTAMINE SULFATE 10 MG/ML IV SOLN
INTRAVENOUS | Status: AC
  Filled 2015-03-08: qty 5

## 2015-03-08 MED ORDER — EPHEDRINE SULFATE 50 MG/ML IJ SOLN
INTRAMUSCULAR | Status: AC
  Filled 2015-03-08: qty 1

## 2015-03-08 MED ORDER — 0.9 % SODIUM CHLORIDE (POUR BTL) OPTIME
TOPICAL | Status: DC | PRN
  Administered 2015-03-08: 1000 mL

## 2015-03-08 MED ORDER — HEPARIN SODIUM (PORCINE) 1000 UNIT/ML IJ SOLN
INTRAMUSCULAR | Status: AC
  Filled 2015-03-08: qty 1

## 2015-03-08 MED ORDER — SODIUM CHLORIDE 0.9 % IR SOLN
Status: DC | PRN
  Administered 2015-03-08: 500 mL

## 2015-03-08 MED ORDER — FENTANYL CITRATE (PF) 100 MCG/2ML IJ SOLN
INTRAMUSCULAR | Status: DC | PRN
  Administered 2015-03-08: 50 ug via INTRAVENOUS

## 2015-03-08 MED ORDER — MIDAZOLAM HCL 2 MG/2ML IJ SOLN
INTRAMUSCULAR | Status: AC
  Filled 2015-03-08: qty 4

## 2015-03-08 MED ORDER — LIDOCAINE-EPINEPHRINE (PF) 1 %-1:200000 IJ SOLN
INTRAMUSCULAR | Status: DC | PRN
  Administered 2015-03-08: 23.5 mL

## 2015-03-08 MED ORDER — PROTAMINE SULFATE 10 MG/ML IV SOLN
INTRAVENOUS | Status: DC | PRN
  Administered 2015-03-08 (×5): 10 mg via INTRAVENOUS

## 2015-03-08 MED ORDER — HEPARIN SODIUM (PORCINE) 1000 UNIT/ML IJ SOLN
INTRAMUSCULAR | Status: DC | PRN
  Administered 2015-03-08: 8000 [IU] via INTRAVENOUS

## 2015-03-08 MED ORDER — OXYCODONE HCL 5 MG PO TABS: 5.0000 mg | ORAL_TABLET | Freq: Four times a day (QID) | ORAL | Status: DC | PRN

## 2015-03-08 MED ORDER — PROPOFOL INFUSION 10 MG/ML OPTIME
INTRAVENOUS | Status: DC | PRN
  Administered 2015-03-08: 120 ug/kg/min via INTRAVENOUS

## 2015-03-08 MED ORDER — MIDAZOLAM HCL 5 MG/5ML IJ SOLN
INTRAMUSCULAR | Status: DC | PRN
  Administered 2015-03-08: 2 mg via INTRAVENOUS

## 2015-03-08 MED ORDER — ONDANSETRON HCL 4 MG/2ML IJ SOLN
INTRAMUSCULAR | Status: AC
  Filled 2015-03-08: qty 2

## 2015-03-08 MED ORDER — LIDOCAINE HCL (PF) 1 % IJ SOLN: INTRAMUSCULAR | Status: DC | PRN

## 2015-03-08 MED ORDER — FENTANYL CITRATE (PF) 250 MCG/5ML IJ SOLN
INTRAMUSCULAR | Status: AC
  Filled 2015-03-08: qty 5

## 2015-03-08 SURGICAL SUPPLY — 37 items
BANDAGE ELASTIC 4 VELCRO ST LF (GAUZE/BANDAGES/DRESSINGS) ×3 IMPLANT
BNDG GAUZE ELAST 4 BULKY (GAUZE/BANDAGES/DRESSINGS) ×3 IMPLANT
CANISTER SUCTION 2500CC (MISCELLANEOUS) ×3 IMPLANT
CANNULA VESSEL 3MM 2 BLNT TIP (CANNULA) IMPLANT
CLIP TI MEDIUM 6 (CLIP) ×3 IMPLANT
CLIP TI WIDE RED SMALL 6 (CLIP) ×3 IMPLANT
COVER PROBE W GEL 5X96 (DRAPES) IMPLANT
DECANTER SPIKE VIAL GLASS SM (MISCELLANEOUS) ×3 IMPLANT
DRAIN PENROSE 1/2X12 LTX STRL (WOUND CARE) IMPLANT
ELECT REM PT RETURN 9FT ADLT (ELECTROSURGICAL) ×3
ELECTRODE REM PT RTRN 9FT ADLT (ELECTROSURGICAL) ×1 IMPLANT
GAUZE SPONGE 4X4 12PLY STRL (GAUZE/BANDAGES/DRESSINGS) ×3 IMPLANT
GLOVE BIO SURGEON STRL SZ7 (GLOVE) ×3 IMPLANT
GLOVE BIO SURGEON STRL SZ7.5 (GLOVE) ×3 IMPLANT
GLOVE BIOGEL PI IND STRL 6.5 (GLOVE) ×1 IMPLANT
GLOVE BIOGEL PI IND STRL 8 (GLOVE) ×1 IMPLANT
GLOVE BIOGEL PI INDICATOR 6.5 (GLOVE) ×2
GLOVE BIOGEL PI INDICATOR 8 (GLOVE) ×2
GLOVE SS BIOGEL STRL SZ 6.5 (GLOVE) ×4 IMPLANT
GLOVE SUPERSENSE BIOGEL SZ 6.5 (GLOVE) ×8
GOWN STRL REUS W/ TWL LRG LVL3 (GOWN DISPOSABLE) ×3 IMPLANT
GOWN STRL REUS W/TWL LRG LVL3 (GOWN DISPOSABLE) ×6
KIT BASIN OR (CUSTOM PROCEDURE TRAY) ×3 IMPLANT
KIT ROOM TURNOVER OR (KITS) ×3 IMPLANT
LIQUID BAND (GAUZE/BANDAGES/DRESSINGS) ×3 IMPLANT
NS IRRIG 1000ML POUR BTL (IV SOLUTION) ×3 IMPLANT
PACK CV ACCESS (CUSTOM PROCEDURE TRAY) ×3 IMPLANT
PAD ARMBOARD 7.5X6 YLW CONV (MISCELLANEOUS) ×6 IMPLANT
SPONGE GAUZE 4X4 12PLY STER LF (GAUZE/BANDAGES/DRESSINGS) ×3 IMPLANT
SPONGE SURGIFOAM ABS GEL 100 (HEMOSTASIS) IMPLANT
SUT PROLENE 5 0 C 1 24 (SUTURE) ×3 IMPLANT
SUT PROLENE 6 0 BV (SUTURE) ×3 IMPLANT
SUT VIC AB 3-0 SH 27 (SUTURE) ×2
SUT VIC AB 3-0 SH 27X BRD (SUTURE) ×1 IMPLANT
SUT VICRYL 4-0 PS2 18IN ABS (SUTURE) ×6 IMPLANT
UNDERPAD 30X30 INCONTINENT (UNDERPADS AND DIAPERS) ×3 IMPLANT
WATER STERILE IRR 1000ML POUR (IV SOLUTION) ×3 IMPLANT

## 2015-03-08 NOTE — Discharge Instructions (Signed)
° ° °  03/08/2015 Jon [name] [id number] [birth year]  Surgeon(s): Angelia Mould, MD  Procedure(s): REPAIR OF LEFT ARTERIOVENOUS FISTULA PSEUDOANEURYSM   x May stick graft on designated area only:  See diagram

## 2015-03-08 NOTE — Interval H&P Note (Signed)
History and Physical Interval Note:  03/08/2015 1:38 PM  Jon [name]  has presented today for surgery, with the diagnosis of End Stage Renal Disease N18.6; Left upper arm arteriovenous fistula pseudoaneurysm I72.9  The various methods of treatment have been discussed with the patient and family. After consideration of risks, benefits and other options for treatment, the patient has consented to  Procedure(s): REPAIR OF ARTERIOVENOUS FISTULA PSEUDOANEURYSM (Left) as a surgical intervention .  The patient's history has been reviewed, patient examined, no change in status, stable for surgery.  I have reviewed the patient's chart and labs.  Questions were answered to the patient's satisfaction.     Deitra Mayo

## 2015-03-08 NOTE — H&P (View-Only) (Signed)
Established Dialysis Access  History of Present Illness  Jon [name] is a 59 y.o. (01/14/1956) male who presents for re-evaluation of L upper arm fistula.  This patient has previously had a plication of the left upper arm fistula.  Recently he has had development of ulceration overlying an enlarging pseudoaneurysm.  His nephrologist refers the patient for repair.  He has not had any bleeding complications from this pseudoaneurysm yet.    Past Medical History  Diagnosis Date  . Hypertension   . Dialysis patient   . Chronic kidney disease   . GERD (gastroesophageal reflux disease)   . Myocardial infarction     lived in [place] maybe 7 years ago  . History of blood transfusion     Past Surgical History  Procedure Laterality Date  . Arteriovenous graft placement    . Small intestine surgery    . Small intestine surgery    . Resection of arteriovenous fistula aneurysm Left 02/05/2013    Procedure: REPAIR OF ANEURYSM OF LEFT ARM ARTERIOVENOUS FISTULA ;  Surgeon: Serafina Mitchell, MD;  Location: [facility];  Service: Vascular;  Laterality: Left;    History   Social History  . Marital Status: Single    Spouse Name: N/A  . Number of Children: 4  . Years of Education: N/A   Occupational History  . DISABILITY    Social History Main Topics  . Smoking status: Never Smoker   . Smokeless tobacco: Never Used  . Alcohol Use: No  . Drug Use: No  . Sexual Activity: Not on file   Other Topics Concern  . Not on file   Social History Narrative    Family History  Problem Relation Age of Onset  . Hypertension Mother   . Other Mother     varicose veins  . Diabetes Father   . Hypertension Father   . Hypertension Sister   . Other Sister     varicose veins  . Other Brother     varicose veins     Current Outpatient Prescriptions  Medication Sig Dispense Refill  . amLODipine (NORVASC) 10 MG tablet Take 10 mg by mouth at bedtime.    Marland Kitchen aspirin EC 81 MG EC tablet Take 1 tablet (81 mg  total) by mouth daily. 30 tablet 3  . atorvastatin (LIPITOR) 20 MG tablet Take 1 tablet (20 mg total) by mouth daily at 6 PM. 30 tablet 3  . labetalol (NORMODYNE) 200 MG tablet Take 200 mg by mouth at bedtime.     Marland Kitchen losartan (COZAAR) 100 MG tablet Take 100 mg by mouth at bedtime.    . multivitamin (RENA-VIT) TABS tablet Take 1 tablet by mouth at bedtime.    . nitroGLYCERIN (NITROSTAT) 0.4 MG SL tablet Place 1 tablet (0.4 mg total) under the tongue every 5 (five) minutes x 3 doses as needed for chest pain. 25 tablet 12  . pantoprazole (PROTONIX) 40 MG tablet Take 1 tablet (40 mg total) by mouth daily at 6 (six) AM. 30 tablet 3   No current facility-administered medications for this visit.     No Known Allergies   REVIEW OF SYSTEMS:  (Positives checked otherwise negative)  CARDIOVASCULAR:   [ ]  chest pain,  [ ]  chest pressure,  [ ]  palpitations,  [ ]  shortness of breath when laying flat,  [ ]  shortness of breath with exertion,   [ ]  pain in feet when walking,  [ ]  pain in feet when laying flat, [ ]   history of blood clot in veins (DVT),  [ ]  history of phlebitis,  [ ]  swelling in legs,  [ ]  varicose veins  PULMONARY:   [ ]  productive cough,  [ ]  asthma,  [ ]  wheezing  NEUROLOGIC:   [ ]  weakness in arms or legs,  [ ]  numbness in arms or legs,  [ ]  difficulty speaking or slurred speech,  [ ]  temporary loss of vision in one eye,  [ ]  dizziness  HEMATOLOGIC:   [ ]  bleeding problems,  [ ]  problems with blood clotting too easily  MUSCULOSKEL:   [ ]  joint pain, [ ]  joint swelling  GASTROINTEST:   [ ]  vomiting blood,  [ ]  blood in stool     GENITOURINARY:   [ ]  burning with urination,  [ ]  blood in urine [x]  ESRD-HD: M-W-F  PSYCHIATRIC:   [ ]  history of major depression  INTEGUMENTARY:   [ ]  rashes,  [ ]  ulcers  CONSTITUTIONAL:   [ ]  fever,  [ ]  chills    Physical Examination  Filed Vitals:   03/04/15 1301 03/04/15 1304  BP: 157/90 159/84  Pulse: 76     Temp: 98.4 F (36.9 C)   TempSrc: Oral   Resp: 18   Height: 6' (1.829 m)   Weight: 227 lb (102.967 kg)   SpO2: 100%    Body mass index is 30.78 kg/(m^2).  General: A&O x 3, WD, WN   Pulmonary: Sym exp, good air movt, CTAB, no rales, rhonchi, & wheezing  Cardiac: RRR, Nl S1, S2, no Murmurs, rubs or gallops  Vascular: Vessel Right Left  Radial Palpable Faintly Palpable  Ulnar Not Palpable Faintly Palpable  Brachial Palpable Palpable   Gastrointestinal: soft, NTND, no G/R, bo HSM, no masses, no CVAT B  Musculoskeletal: M/S 5/5 throughout , Extremities without  ischemic changes , palpable thrill in access in L upper arm, + bruit in access, large aneurysm L BC AVF with pseudoaneurysm with ulcerated skin overlying the fistula.  No frank bleeding at this point  Neurologic: Pain and light touch intact in extremities , Motor exam as listed above  Medical Decision Making  Jon [name] is a 59 y.o. 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  I had an extensive discussion with this patient in regards to the nature of access surgery, including risk, benefits, and alternatives.    The patient is aware that the risks of access surgery include but are not limited to: bleeding, infection, steal syndrome, nerve damage, ischemic monomelic neuropathy, failure of access to mature, and possible need for additional access procedures in the future.  He is aware that intervention on this fistula might lead to thrombosis and that ligation of the fistula maybe necessary if the fistula wall cannot be repaired.  The patient has agreed to proceed with the above procedure which will be scheduled 9 AUG 16 with Dr. Scot Dock.  I gave him strict instructions in regards to possible bleeding from L BC AVF.  He knows to call 911 and come to [facility] in the event  of bleeding.   Adele Barthel, MD [facility] [place] Office: [phone] Pager: [phone]  03/04/2015, 1:21 PM

## 2015-03-08 NOTE — Transfer of Care (Signed)
Immediate Anesthesia Transfer of Care Note  Patient: Jon [name]  Procedure(s) Performed: Procedure(s): REPAIR OF LEFT ARTERIOVENOUS FISTULA PSEUDOANEURYSM (Left)  Patient Location: PACU  Anesthesia Type:MAC   Level of Consciousness: awake, alert  and oriented  Airway & Oxygen Therapy: Patient Spontanous Breathing and Patient connected to nasal cannula oxygen  Post-op Assessment: Report given to RN and Post -op Vital signs reviewed and stable  Post vital signs: Reviewed and stable  Last Vitals:  Filed Vitals:   03/08/15 1203  BP: 181/92  Pulse: 71  Temp: 36.8 C  Resp: 18    Complications: No apparent anesthesia complications

## 2015-03-08 NOTE — Anesthesia Postprocedure Evaluation (Signed)
Anesthesia Post Note  Patient: [name]  Procedure(s) Performed: Procedure(s) (LRB): REPAIR OF LEFT ARTERIOVENOUS FISTULA PSEUDOANEURYSM (Left)  Anesthesia type: MAC  Patient location: PACU  Post pain: Pain level controlled and Adequate analgesia  Post assessment: Post-op Vital signs reviewed, Patient's Cardiovascular Status Stable and Respiratory Function Stable  Last Vitals:  Filed Vitals:   03/08/15 1722  BP: 160/91  Pulse:   Temp:   Resp: 16    Post vital signs: Reviewed and stable  Level of consciousness: awake, alert  and oriented  Complications: No apparent anesthesia complications

## 2015-03-08 NOTE — Progress Notes (Signed)
Pt called to ask about meds to take this am.  Instructed him to take protonix only.  Voiced understanding.

## 2015-03-08 NOTE — Op Note (Signed)
    NAMEReginold [name]    MRN: [id number] DOB: 04-22-1956    DATE OF OPERATION: 03/08/2015  PREOP DIAGNOSIS: aneurysmal left brachiocephalic AV fistula  POSTOP DIAGNOSIS: same  PROCEDURE: excision of large aneurysms of left brachiocephalic AV fistula  SURGEON: Judeth Cornfield. Scot Dock, MD, FACS  ASSIST: Delena Serve, RNFA  ANESTHESIA: local with sedation   EBL: minimal  INDICATIONS: Jon [name] is a 59 y.o. 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 The skin overlying the fistula was compromised and I was asked to revise the fistula and excise the aneurysm.  FINDINGS: there was a complex aneurysm of the fistula and the fistula was markedly tortuous. I was able to excise the large aneurysm and reanastomose the vein end to end.   TECHNIQUE: The patient was taken to the operating room and sedated by anesthesia. Left upper extremity was prepped and draped in usual sterile fashion. The patient had one very large aneurysm and an adjacent aneurysm. The vein was very tortuous. In order to encompass the skin which was compromise the width of the wound was approximately 2-1/2 cm in the length had to be 8 cm. A large ellipse of skin was incised and the large aneurysms were dissected free circumferentially. I was able to get control of the fistula proximally and distal to the area of aneurysmal degeneration. The patient was then heparinized. The vein was then clamped proximally and distally and the aneurysms were excised. The vein was then redundant enough to sew the vein back end to end with 5-0 proline suture. At the completion was a good thrill in the fistula. Hemostasis was obtained in the wound and the heparin was partially reversed with protamine. The wound was then closed with interrupted 3-0 Vicryl's. The skin was closed with 4-0 Vicryl. Sterile dressing was applied. Patient tolerated she did well and transferred to recovery in stable condition. All needle and  sponge counts were correct.  Deitra Mayo, MD, FACS [facility]  DATE OF DICTATION:   03/08/2015

## 2015-03-08 NOTE — Anesthesia Procedure Notes (Signed)
Procedure Name: MAC Date/Time: 03/08/2015 2:00 PM Performed by: Rogers Blocker Pre-anesthesia Checklist: Patient identified, Timeout performed, Emergency Drugs available, Suction available and Patient being monitored Oxygen Delivery Method: Simple face mask Placement Confirmation: positive ETCO2

## 2015-03-08 NOTE — Anesthesia Preprocedure Evaluation (Signed)
Anesthesia Evaluation  Patient identified by MRN, date of birth, ID band Patient awake    Reviewed: Allergy & Precautions, NPO status , Patient's Chart, lab work & pertinent test results  Airway Mallampati: II   Neck ROM: full    Dental   Pulmonary neg pulmonary ROS,  breath sounds clear to auscultation        Cardiovascular hypertension, + CAD and + Past MI Rhythm:regular Rate:Normal     Neuro/Psych    GI/Hepatic GERD-  ,  Endo/Other    Renal/GU ESRF and DialysisRenal disease     Musculoskeletal   Abdominal   Peds  Hematology   Anesthesia Other Findings   Reproductive/Obstetrics                             Anesthesia Physical Anesthesia Plan  ASA: III  Anesthesia Plan: MAC   Post-op Pain Management:    Induction: Intravenous  Airway Management Planned: Simple Face Mask  Additional Equipment:   Intra-op Plan:   Post-operative Plan:   Informed Consent: I have reviewed the patients History and Physical, chart, labs and discussed the procedure including the risks, benefits and alternatives for the proposed anesthesia with the patient or authorized representative who has indicated his/her understanding and acceptance.     Plan Discussed with: CRNA, Anesthesiologist and Surgeon  Anesthesia Plan Comments:         Anesthesia Quick Evaluation

## 2015-03-09 ENCOUNTER — Encounter (HOSPITAL_COMMUNITY): Payer: Self-pay | Admitting: Vascular Surgery

## 2015-03-09 ENCOUNTER — Emergency Department (HOSPITAL_COMMUNITY)
Admission: EM | Admit: 2015-03-09 | Discharge: 2015-03-10 | Disposition: A | Discharge status: Home / Self Care | Payer: Medicare Other | Attending: Emergency Medicine | Admitting: Emergency Medicine

## 2015-03-09 DIAGNOSIS — Z79899 Other long term (current) drug therapy: Secondary | ICD-10-CM | POA: Insufficient documentation

## 2015-03-09 DIAGNOSIS — N186 End stage renal disease: Secondary | ICD-10-CM | POA: Diagnosis not present

## 2015-03-09 DIAGNOSIS — Z992 Dependence on renal dialysis: Secondary | ICD-10-CM | POA: Diagnosis not present

## 2015-03-09 DIAGNOSIS — M7989 Other specified soft tissue disorders: Secondary | ICD-10-CM | POA: Insufficient documentation

## 2015-03-09 DIAGNOSIS — Z8719 Personal history of other diseases of the digestive system: Secondary | ICD-10-CM | POA: Insufficient documentation

## 2015-03-09 DIAGNOSIS — I252 Old myocardial infarction: Secondary | ICD-10-CM | POA: Diagnosis not present

## 2015-03-09 DIAGNOSIS — I120 Hypertensive chronic kidney disease with stage 5 chronic kidney disease or end stage renal disease: Secondary | ICD-10-CM | POA: Diagnosis not present

## 2015-03-09 DIAGNOSIS — Z7982 Long term (current) use of aspirin: Secondary | ICD-10-CM | POA: Insufficient documentation

## 2015-03-09 DIAGNOSIS — D509 Iron deficiency anemia, unspecified: Secondary | ICD-10-CM | POA: Diagnosis not present

## 2015-03-09 DIAGNOSIS — N2581 Secondary hyperparathyroidism of renal origin: Secondary | ICD-10-CM | POA: Diagnosis not present

## 2015-03-09 LAB — COMPREHENSIVE METABOLIC PANEL
ALBUMIN: 3.9 g/dL (ref 3.5–5.0)
ALT: 11 U/L — ABNORMAL LOW (ref 17–63)
ANION GAP: 11 (ref 5–15)
AST: 15 U/L (ref 15–41)
Alkaline Phosphatase: 43 U/L (ref 38–126)
BUN: 22 mg/dL — ABNORMAL HIGH (ref 6–20)
CO2: 31 mmol/L (ref 22–32)
CREATININE: 11.5 mg/dL — AB (ref 0.61–1.24)
Calcium: 8.6 mg/dL — ABNORMAL LOW (ref 8.9–10.3)
Chloride: 95 mmol/L — ABNORMAL LOW (ref 101–111)
GFR calc Af Amer: 5 mL/min — ABNORMAL LOW (ref >60)
GFR calc non Af Amer: 4 mL/min — ABNORMAL LOW (ref >60)
Glucose, Bld: 89 mg/dL (ref 65–99)
Potassium: 4.8 mmol/L (ref 3.5–5.1)
Sodium: 137 mmol/L (ref 135–145)
TOTAL PROTEIN: 7.1 g/dL (ref 6.5–8.1)
Total Bilirubin: 0.7 mg/dL (ref 0.3–1.2)

## 2015-03-09 LAB — CBC WITH DIFFERENTIAL/PLATELET
BASOS ABS: 0 10*3/uL (ref 0.0–0.1)
BASOS PCT: 1 % (ref 0–1)
EOS ABS: 0.1 10*3/uL (ref 0.0–0.7)
Eosinophils Relative: 2 % (ref 0–5)
HCT: 38 % — ABNORMAL LOW (ref 39.0–52.0)
Hemoglobin: 12.4 g/dL — ABNORMAL LOW (ref 13.0–17.0)
Lymphocytes Relative: 28 % (ref 12–46)
Lymphs Abs: 1.8 10*3/uL (ref 0.7–4.0)
MCH: 30.4 pg (ref 26.0–34.0)
MCHC: 32.6 g/dL (ref 30.0–36.0)
MCV: 93.1 fL (ref 78.0–100.0)
MONO ABS: 0.6 10*3/uL (ref 0.1–1.0)
Monocytes Relative: 8 % (ref 3–12)
NEUTROS ABS: 4 10*3/uL (ref 1.7–7.7)
NEUTROS PCT: 61 % (ref 43–77)
Platelets: 131 10*3/uL — ABNORMAL LOW (ref 150–400)
RBC: 4.08 MIL/uL — ABNORMAL LOW (ref 4.22–5.81)
RDW: 14.3 % (ref 11.5–15.5)
WBC: 6.6 10*3/uL (ref 4.0–10.5)

## 2015-03-09 NOTE — ED Notes (Signed)
Pt. reports pain/swelling at left upper arm AV graft , pt. stated graft was repaired yesterday , did not complete his hemodialysis today due to pain and swelling .

## 2015-03-09 NOTE — ED Provider Notes (Signed)
CSN: [id number]     Arrival date & time 03/09/15  1920 History  This chart was scribed for Everlene Balls, MD by Evelene Croon, ED Scribe. This patient was seen in room D31C/D31C and the patient's care was started 11:41 PM.    Chief Complaint  Patient presents with  . Arm Swelling    The history is provided by the patient. No language interpreter was used.     HPI Comments:  Jon [name] is a 59 y.o. male with a PMHx of HTN and  chronic kidney disease who presents to the Emergency Department complaining of moderate pain and swelling to his LUE around fistula site, that started while receiving dialysis today. Pt notes he had surgery to repair the AV graft yesterday(surgeon = Scot Dock).  No alleviating factors or associated symptoms noted.   Past Medical History  Diagnosis Date  . Hypertension   . Dialysis patient   . Chronic kidney disease   . GERD (gastroesophageal reflux disease)   . Myocardial infarction     lived in [place] maybe 7 years ago  . History of blood transfusion    Past Surgical History  Procedure Laterality Date  . Arteriovenous graft placement    . Small intestine surgery    . Small intestine surgery    . Resection of arteriovenous fistula aneurysm Left 02/05/2013    Procedure: REPAIR OF ANEURYSM OF LEFT ARM ARTERIOVENOUS FISTULA ;  Surgeon: Serafina Mitchell, MD;  Location: [place] OR;  Service: Vascular;  Laterality: Left;  . Resection of arteriovenous fistula aneurysm Left 03/08/2015    Procedure: REPAIR OF LEFT ARTERIOVENOUS FISTULA PSEUDOANEURYSM;  Surgeon: Angelia Mould, MD;  Location: [facility] OR;  Service: Vascular;  Laterality: Left;   Family History  Problem Relation Age of Onset  . Hypertension Mother   . Other Mother     varicose veins  . Diabetes Father   . Hypertension Father   . Hypertension Sister   . Other Sister     varicose veins  . Other Brother     varicose veins   Social History  Substance Use Topics  . Smoking status: Never Smoker   .  Smokeless tobacco: Never Used  . Alcohol Use: No    Review of Systems  A complete 10 system review of systems was obtained and all systems are negative except as noted in the HPI and PMH.     Allergies  Review of patient's allergies indicates no known allergies.  Home Medications   Prior to Admission medications   Medication Sig Start Date End Date Taking? Authorizing Provider  amLODipine (NORVASC) 10 MG tablet Take 10 mg by mouth at bedtime.   Yes Historical Provider, MD  aspirin EC 81 MG EC tablet Take 1 tablet (81 mg total) by mouth daily. 05/30/13  Yes Charolette Forward, MD  atorvastatin (LIPITOR) 20 MG tablet Take 1 tablet (20 mg total) by mouth daily at 6 PM. 05/30/13  Yes Charolette Forward, MD  labetalol (NORMODYNE) 200 MG tablet Take 200 mg by mouth at bedtime.    Yes Historical Provider, MD  losartan (COZAAR) 100 MG tablet Take 100 mg by mouth at bedtime.   Yes Historical Provider, MD  multivitamin (RENA-VIT) TABS tablet Take 1 tablet by mouth at bedtime.   Yes Historical Provider, MD  nitroGLYCERIN (NITROSTAT) 0.4 MG SL tablet Place 1 tablet (0.4 mg total) under the tongue every 5 (five) minutes x 3 doses as needed for chest pain. 05/30/13  Yes Charolette Forward,  MD  oxyCODONE (ROXICODONE) 5 MG immediate release tablet Take 1 tablet (5 mg total) by mouth every 6 (six) hours as needed. Patient taking differently: Take 5 mg by mouth every 6 (six) hours as needed for moderate pain.  03/08/15  Yes Samantha J Rhyne, PA-C   BP 173/89 mmHg  Pulse 71  Temp(Src) 98 F (36.7 C) (Oral)  Resp 18  SpO2 100% Physical Exam  Constitutional: He is oriented to person, place, and time. Vital signs are normal. He appears well-developed and well-nourished.  Non-toxic appearance. He does not appear ill. No distress.  HENT:  Head: Normocephalic and atraumatic.  Nose: Nose normal.  Mouth/Throat: Oropharynx is clear and moist. No oropharyngeal exudate.  Eyes: Conjunctivae and EOM are normal. Pupils are equal,  round, and reactive to light. No scleral icterus.  Neck: Normal range of motion. Neck supple. No tracheal deviation, no edema, no erythema and normal range of motion present. No thyroid mass and no thyromegaly present.  Cardiovascular: Normal rate, regular rhythm, S1 normal, S2 normal, normal heart sounds, intact distal pulses and normal pulses.  Exam reveals no gallop and no friction rub.   No murmur heard. Pulses:      Radial pulses are 2+ on the right side, and 2+ on the left side.       Dorsalis pedis pulses are 2+ on the right side, and 2+ on the left side.  Pulmonary/Chest: Effort normal and breath sounds normal. No respiratory distress. He has no wheezes. He has no rhonchi. He has no rales.  Abdominal: Soft. Normal appearance and bowel sounds are normal. He exhibits no distension, no ascites and no mass. There is no hepatosplenomegaly. There is no tenderness. There is no rebound, no guarding and no CVA tenderness.  Musculoskeletal: Normal range of motion. He exhibits edema.  LUE fistula with obvious swelling and palpable thrill Normal strength and pulses distally  Lymphadenopathy:    He has no cervical adenopathy.  Neurological: He is alert and oriented to person, place, and time. He has normal strength. No cranial nerve deficit or sensory deficit.  Skin: Skin is warm, dry and intact. No petechiae and no rash noted. He is not diaphoretic. No erythema. No pallor.  Psychiatric: He has a normal mood and affect. His behavior is normal. Judgment normal.  Nursing note and vitals reviewed.   ED Course  Procedures   DIAGNOSTIC STUDIES:  Oxygen Saturation is 100% on RA, normal by my interpretation.    COORDINATION OF CARE:  11:44 PM Discussed treatment plan with pt at bedside and pt agreed to plan.  Labs Review Labs Reviewed  CBC WITH DIFFERENTIAL/PLATELET - Abnormal; Notable for the following:    RBC 4.08 (*)    Hemoglobin 12.4 (*)    HCT 38.0 (*)    Platelets 131 (*)    All  other components within normal limits  COMPREHENSIVE METABOLIC PANEL - Abnormal; Notable for the following:    Chloride 95 (*)    BUN 22 (*)    Creatinine, Ser 11.50 (*)    Calcium 8.6 (*)    ALT 11 (*)    GFR calc non Af Amer 4 (*)    GFR calc Af Amer 5 (*)    All other components within normal limits    Imaging Review No results found.   EKG Interpretation None      MDM   Final diagnoses:  None   Patient presents emergency department for swelling of his left upper extremity AV  fistula. He recently had his graft and aneurysm excised yesterday. He states hemodialysis catheterized him in the wrong location. He does have pain as areas well. I patient surgeon, Dr. Scot Dock for evaluation.  24/7 vascular US is not available.  I spoke with Dr. Kellie Simmering with vascular surgery who will see the patient emergency department for evaluation.  Dr. Kellie Simmering has evaluated the patient and dress the area appropriately. He recommends for discharge with close follow-up with Dr. Scot Dock during his previously scheduled appointment. The arm does not appear to be expanding in emergency department. Patient overall appears well and in no acute distress. His vital signs remain within his normal limits he is safe for discharge.    I personally performed the services described in this documentation, which was scribed in my presence. The recorded information has been reviewed and is accurate.   Everlene Balls, MD 03/10/15 [phone]

## 2015-03-10 ENCOUNTER — Telehealth: Payer: Self-pay | Admitting: Vascular Surgery

## 2015-03-10 ENCOUNTER — Encounter (HOSPITAL_COMMUNITY)
Admission: EM | Disposition: A | Discharge status: Home / Self Care | Payer: Self-pay | Source: Home / Self Care | Attending: Emergency Medicine

## 2015-03-10 SURGERY — INSERTION OF DIALYSIS CATHETER
Anesthesia: General | Laterality: Left

## 2015-03-10 NOTE — ED Notes (Signed)
Vascular surgeon at bedside.

## 2015-03-10 NOTE — Telephone Encounter (Signed)
Left Message for patient regarding appointment for follow up with MD. Left message for pt to call [phone] if needing to reschedule.

## 2015-03-10 NOTE — Consult Note (Addendum)
Vascular Surgery Consultation  Reason for Consult: Swelling left arm after hemodialysis  HPI: Jon [name] is a 59 y.o. male who presents for evaluation of swelling left upper arm after hemodialysis. Patient had revision of left arm fistula by Dr. Doren Custard 2 days ago with resection of pseudoaneurysm and repair. Patient was instructed not to let dialysis unit access this area for 6 weeks but to use a more proximal site. Picture was also sent accompanying the patient with instructions. Dialysis unit punctured the area where the surgery was performed and the patient developed swelling. They then removed the catheter from that area and punctured the appropriate spot. Patient came to the emergency department because of the swelling. He has had mild-to-moderate pain. He states the swelling has not been increasing over the past several hours. His dialysis session was about 14 hours ago.   Past Medical History  Diagnosis Date  . Hypertension   . Dialysis patient   . Chronic kidney disease   . GERD (gastroesophageal reflux disease)   . Myocardial infarction     lived in [place] maybe 7 years ago  . History of blood transfusion    Past Surgical History  Procedure Laterality Date  . Arteriovenous graft placement    . Small intestine surgery    . Small intestine surgery    . Resection of arteriovenous fistula aneurysm Left 02/05/2013    Procedure: REPAIR OF ANEURYSM OF LEFT ARM ARTERIOVENOUS FISTULA ;  Surgeon: Serafina Mitchell, MD;  Location: [place] OR;  Service: Vascular;  Laterality: Left;  . Resection of arteriovenous fistula aneurysm Left 03/08/2015    Procedure: REPAIR OF LEFT ARTERIOVENOUS FISTULA PSEUDOANEURYSM;  Surgeon: Angelia Mould, MD;  Location: [facility];  Service: Vascular;  Laterality: Left;   Social History   Social History  . Marital Status: Single    Spouse Name: N/A  . Number of Children: 4  . Years of Education: N/A   Occupational History  . DISABILITY    Social History  Main Topics  . Smoking status: Never Smoker   . Smokeless tobacco: Never Used  . Alcohol Use: No  . Drug Use: No  . Sexual Activity: Not Asked   Other Topics Concern  . None   Social History Narrative   Family History  Problem Relation Age of Onset  . Hypertension Mother   . Other Mother     varicose veins  . Diabetes Father   . Hypertension Father   . Hypertension Sister   . Other Sister     varicose veins  . Other Brother     varicose veins   No Known Allergies Prior to Admission medications   Medication Sig Start Date End Date Taking? Authorizing Provider  amLODipine (NORVASC) 10 MG tablet Take 10 mg by mouth at bedtime.   Yes Historical Provider, MD  aspirin EC 81 MG EC tablet Take 1 tablet (81 mg total) by mouth daily. 05/30/13  Yes Charolette Forward, MD  atorvastatin (LIPITOR) 20 MG tablet Take 1 tablet (20 mg total) by mouth daily at 6 PM. 05/30/13  Yes Charolette Forward, MD  labetalol (NORMODYNE) 200 MG tablet Take 200 mg by mouth at bedtime.    Yes Historical Provider, MD  losartan (COZAAR) 100 MG tablet Take 100 mg by mouth at bedtime.   Yes Historical Provider, MD  multivitamin (RENA-VIT) TABS tablet Take 1 tablet by mouth at bedtime.   Yes Historical Provider, MD  nitroGLYCERIN (NITROSTAT) 0.4 MG SL tablet Place 1 tablet (  0.4 mg total) under the tongue every 5 (five) minutes x 3 doses as needed for chest pain. 05/30/13  Yes Charolette Forward, MD  oxyCODONE (ROXICODONE) 5 MG immediate release tablet Take 1 tablet (5 mg total) by mouth every 6 (six) hours as needed. Patient taking differently: Take 5 mg by mouth every 6 (six) hours as needed for moderate pain.  03/08/15  Yes Samantha J Rhyne, PA-C     Positive ROS:   All other systems have been reviewed and were otherwise negative with the exception of those mentioned in the HPI and as above.  Physical Exam: Filed Vitals:   03/10/15 0000  BP: 160/78  Pulse: 68  Temp:   Resp:     General: Alert, no acute distress HEENT:  Normal for age Cardiovascular: Regular rate and rhythm. Carotid pulses 2+, no bruits audible Respiratory: Clear to auscultation. No cyanosis, no use of accessory musculature GI: No organomegaly, abdomen is soft and non-tender Skin: No lesions in the area of chief complaint Neurologic: Sensation intact distally Psychiatric: Patient is competent for consent with normal mood and affect Musculoskeletal: No obvious deformities Extremities: Left upper extremity with functioning brachial-cephalic AV fistula which is quite dilated and the arterial anastomosis. There is some diffuse swelling beneath the fresh surgical wound which is in the mid upper arm and is about 10-12 cm in length. There is no large expanding hematoma or pulsatile mass. The swelling is more of a diffuse nature. Upper arm is about 4 cm larger than the contralateral right upper arm but much of this was chronic.    Assessment/Plan:  Diffuse swelling left upper arm following an appropriate puncture of fresh surgical site where AV fistula was revised than a half ago. Swelling is stable and not expanding. Fistula is functioning normally with good pulse and palpable thrill and no distal ischemia.  Patient has pain medication from previous surgery 2 days ago Once again patient will instruct the dialysis unit not to puncture the fresh surgical site The swelling should begin to increase patient will return to emergency Department but this has been quite stable for several hours He will keep appointment with Dr. Doren Custard in 3 weeks as previously made   Tinnie Gens, MD 03/10/2015 1:00 AM

## 2015-03-10 NOTE — Discharge Instructions (Signed)
AV Fistula, Care After Jon [name], Your Dialysis center should not puncture your new fistula site. See Dr. Doren Custard within 3 days for close follow-up. If your arm continues to swell or you have any worsening symptoms come back to emergency department immediately. Thank you. Refer to this sheet in the next few weeks. These instructions provide you with information on caring for yourself after your procedure. Your caregiver may also give you more specific instructions. Your treatment has been planned according to current medical practices, but problems sometimes occur. Call your caregiver if you have any problems or questions after your procedure. HOME CARE INSTRUCTIONS   Do not drive a car or take public transportation alone.  Do not drink alcohol.  Only take medicine that has been prescribed by your caregiver.  Do not sign important papers or make important decisions.  Have a responsible person with you.  Ask your caregiver to show you how to check your access at home for a vibration (called a "thrill") or for a sound (called a "bruit" pronounced brew-ee).  Your vein will need time to enlarge and mature so needles can be inserted for dialysis. Follow your caregiver's instructions about what you need to do to make this happen.  Keep dressings clean and dry.  Keep the arm elevated above your heart. Use a pillow.  Rest.  Use the arm as usual for all activities.  Have the stitches or tape closures removed in 10 to 14 days, or as directed by your caregiver.  Do not sleep or lie on the area of the fistula or that arm. This may decrease or stop the blood flow through your fistula.  Do not allow blood pressures to be taken on this arm.  Do not allow blood drawing to be done from the graft.  Do not wear tight clothing around the access site or on the arm.  Avoid lifting heavy objects with the arm that has the fistula.  Do not use creams or lotions over the access site. SEEK MEDICAL CARE  IF:   You have a fever.  You have swelling around the fistula that gets worse, or you have new pain.  You have unusual bleeding at the fistula site or from any other area.  You have pus or other drainage at the fistula site.  You have skin redness or red streaking on the skin around, above, or below the fistula site.  Your access site feels warm.  You have any flu-like symptoms. SEEK IMMEDIATE MEDICAL CARE IF:   You have pain, numbness, or an unusual pale skin on the hand or on the side of your fistula.  You have dizziness or weakness that you have not had before.  You have shortness of breath.  You have chest pain.  Your fistula disconnects or breaks, and there is bleeding that cannot be easily controlled. Call for local emergency medical help. Do not try to drive yourself to the hospital. MAKE SURE YOU  Understand these instructions.  Will watch your condition.  Will get help right away if you are not doing well or get worse. Document Released: 07/16/2005 Document Revised: 11/30/2013 Document Reviewed: 01/03/2011 [facility] Patient Information 2015 [place], [place]. This information is not intended to replace advice given to you by your health care provider. Make sure you discuss any questions you have with your health care provider.

## 2015-03-10 NOTE — Telephone Encounter (Signed)
-----   Message from Mena Goes, RN sent at 03/08/2015  3:48 PM EDT ----- Regarding: Schedule   ----- Message -----    From: Gabriel Earing, PA-C    Sent: 03/08/2015   3:34 PM      To: Vvs Charge Pool  S/p repair of PSA left arm 03/08/15. F/u with Dr. Scot Dock in 3 weeks.  No duplex needed.  Thanks, Aldona Bar

## 2015-03-10 NOTE — Telephone Encounter (Signed)
-----   Message from Mena Goes, RN sent at 03/10/2015  9:12 AM EDT ----- Regarding: Schedule   ----- Message -----    From: Mal Misty, MD    Sent: 03/10/2015   1:05 AM      To: Vvs Charge Pool  [place] 06/19/2015 Consults seen in the emergency department by Dr. Kellie Simmering Swelling left upper arm AV fistula at surgical site where revision of fistula was performed by Dr. Doren Custard 2 days earlier and fistula was inappropriately accessed  Patient will return to office to see Dr. Doren Custard in 3 weeks as scheduled no surgery required

## 2015-03-11 DIAGNOSIS — E785 Hyperlipidemia, unspecified: Secondary | ICD-10-CM | POA: Diagnosis not present

## 2015-03-11 DIAGNOSIS — I6529 Occlusion and stenosis of unspecified carotid artery: Secondary | ICD-10-CM | POA: Diagnosis not present

## 2015-03-11 DIAGNOSIS — D509 Iron deficiency anemia, unspecified: Secondary | ICD-10-CM | POA: Diagnosis not present

## 2015-03-11 DIAGNOSIS — R7302 Impaired glucose tolerance (oral): Secondary | ICD-10-CM | POA: Diagnosis not present

## 2015-03-11 DIAGNOSIS — I2510 Atherosclerotic heart disease of native coronary artery without angina pectoris: Secondary | ICD-10-CM | POA: Diagnosis not present

## 2015-03-11 DIAGNOSIS — Z992 Dependence on renal dialysis: Secondary | ICD-10-CM | POA: Diagnosis not present

## 2015-03-11 DIAGNOSIS — N2581 Secondary hyperparathyroidism of renal origin: Secondary | ICD-10-CM | POA: Diagnosis not present

## 2015-03-11 DIAGNOSIS — I10 Essential (primary) hypertension: Secondary | ICD-10-CM | POA: Diagnosis not present

## 2015-03-11 DIAGNOSIS — N186 End stage renal disease: Secondary | ICD-10-CM | POA: Diagnosis not present

## 2015-03-11 DIAGNOSIS — E669 Obesity, unspecified: Secondary | ICD-10-CM | POA: Diagnosis not present

## 2015-03-11 DIAGNOSIS — D649 Anemia, unspecified: Secondary | ICD-10-CM | POA: Diagnosis not present

## 2015-03-14 DIAGNOSIS — N186 End stage renal disease: Secondary | ICD-10-CM | POA: Diagnosis not present

## 2015-03-14 DIAGNOSIS — N2581 Secondary hyperparathyroidism of renal origin: Secondary | ICD-10-CM | POA: Diagnosis not present

## 2015-03-14 DIAGNOSIS — D509 Iron deficiency anemia, unspecified: Secondary | ICD-10-CM | POA: Diagnosis not present

## 2015-03-15 ENCOUNTER — Encounter: Payer: Self-pay | Admitting: Nephrology

## 2015-03-16 DIAGNOSIS — N186 End stage renal disease: Secondary | ICD-10-CM | POA: Diagnosis not present

## 2015-03-16 DIAGNOSIS — N2581 Secondary hyperparathyroidism of renal origin: Secondary | ICD-10-CM | POA: Diagnosis not present

## 2015-03-16 DIAGNOSIS — D509 Iron deficiency anemia, unspecified: Secondary | ICD-10-CM | POA: Diagnosis not present

## 2015-03-18 DIAGNOSIS — N2581 Secondary hyperparathyroidism of renal origin: Secondary | ICD-10-CM | POA: Diagnosis not present

## 2015-03-18 DIAGNOSIS — D509 Iron deficiency anemia, unspecified: Secondary | ICD-10-CM | POA: Diagnosis not present

## 2015-03-18 DIAGNOSIS — N186 End stage renal disease: Secondary | ICD-10-CM | POA: Diagnosis not present

## 2015-03-21 DIAGNOSIS — N2581 Secondary hyperparathyroidism of renal origin: Secondary | ICD-10-CM | POA: Diagnosis not present

## 2015-03-21 DIAGNOSIS — D509 Iron deficiency anemia, unspecified: Secondary | ICD-10-CM | POA: Diagnosis not present

## 2015-03-21 DIAGNOSIS — N186 End stage renal disease: Secondary | ICD-10-CM | POA: Diagnosis not present

## 2015-03-22 ENCOUNTER — Emergency Department (HOSPITAL_COMMUNITY)
Admission: EM | Admit: 2015-03-22 | Discharge: 2015-03-22 | Disposition: A | Discharge status: Home / Self Care | Payer: Medicare Other | Attending: Emergency Medicine | Admitting: Emergency Medicine

## 2015-03-22 ENCOUNTER — Encounter (HOSPITAL_COMMUNITY): Payer: Self-pay | Admitting: *Deleted

## 2015-03-22 DIAGNOSIS — Z5189 Encounter for other specified aftercare: Secondary | ICD-10-CM

## 2015-03-22 DIAGNOSIS — N189 Chronic kidney disease, unspecified: Secondary | ICD-10-CM | POA: Insufficient documentation

## 2015-03-22 DIAGNOSIS — I129 Hypertensive chronic kidney disease with stage 1 through stage 4 chronic kidney disease, or unspecified chronic kidney disease: Secondary | ICD-10-CM | POA: Insufficient documentation

## 2015-03-22 DIAGNOSIS — Z7982 Long term (current) use of aspirin: Secondary | ICD-10-CM | POA: Diagnosis not present

## 2015-03-22 DIAGNOSIS — Z4801 Encounter for change or removal of surgical wound dressing: Secondary | ICD-10-CM | POA: Diagnosis not present

## 2015-03-22 DIAGNOSIS — I252 Old myocardial infarction: Secondary | ICD-10-CM | POA: Insufficient documentation

## 2015-03-22 DIAGNOSIS — Z4800 Encounter for change or removal of nonsurgical wound dressing: Secondary | ICD-10-CM | POA: Diagnosis not present

## 2015-03-22 DIAGNOSIS — Z79899 Other long term (current) drug therapy: Secondary | ICD-10-CM | POA: Insufficient documentation

## 2015-03-22 DIAGNOSIS — Z8719 Personal history of other diseases of the digestive system: Secondary | ICD-10-CM | POA: Diagnosis not present

## 2015-03-22 NOTE — ED Provider Notes (Signed)
CSN: [id number]     Arrival date & time 03/22/15  0825 History   First MD Initiated Contact with Patient 03/22/15 [phone]     Chief Complaint  Patient presents with  . Vascular Access Problem     (Consider location/radiation/quality/duration/timing/severity/associated sxs/prior Treatment) HPI   59 year old male who had a AV fistula revision in his left arm approximately week ago subsequently his dialysis Center tried access it and he hasn't been swelling of his left arm. He presented here for evaluation and Dr. Junious Silk with surgery saw him. There is no concern for compartment syndrome that time he is neurovascularly intact. To the decision was to discharge and have him follow-up in a week for reevaluation in the emergency department and then to follow-up with Dr. Doren Custard in 3 weeks. He is here for his recheck. He says the swelling is is much improved he has minimal pain associated with it. He states it is still swollen but no evidence of worsening redness, fever, nausea, vomiting or other systemic symptoms. He feels just full strength in that left hand where the swelling is. It has normal sensation without any numbness or paresthesias.  Past Medical History  Diagnosis Date  . Hypertension   . Dialysis patient   . Chronic kidney disease   . GERD (gastroesophageal reflux disease)   . Myocardial infarction     lived in [place] maybe 7 years ago  . History of blood transfusion    Past Surgical History  Procedure Laterality Date  . Arteriovenous graft placement    . Small intestine surgery    . Small intestine surgery    . Resection of arteriovenous fistula aneurysm Left 02/05/2013    Procedure: REPAIR OF ANEURYSM OF LEFT ARM ARTERIOVENOUS FISTULA ;  Surgeon: Serafina Mitchell, MD;  Location: [place] OR;  Service: Vascular;  Laterality: Left;  . Resection of arteriovenous fistula aneurysm Left 03/08/2015    Procedure: REPAIR OF LEFT ARTERIOVENOUS FISTULA PSEUDOANEURYSM;  Surgeon: Angelia Mould,  MD;  Location: [facility] OR;  Service: Vascular;  Laterality: Left;   Family History  Problem Relation Age of Onset  . Hypertension Mother   . Other Mother     varicose veins  . Diabetes Father   . Hypertension Father   . Hypertension Sister   . Other Sister     varicose veins  . Other Brother     varicose veins   Social History  Substance Use Topics  . Smoking status: Never Smoker   . Smokeless tobacco: Never Used  . Alcohol Use: No    Review of Systems  Constitutional: Negative for fever, chills and fatigue.  Eyes: Negative for pain and discharge.  Respiratory: Negative for shortness of breath, wheezing and stridor.   Endocrine: Negative for polydipsia and polyuria.  Genitourinary: Negative for dysuria.  Musculoskeletal:       Left Upper arm swelling.  Neurological: Negative for seizures, weakness and headaches.  All other systems reviewed and are negative.     Allergies  Review of patient's allergies indicates no known allergies.  Home Medications   Prior to Admission medications   Medication Sig Start Date End Date Taking? Authorizing Provider  amLODipine (NORVASC) 10 MG tablet Take 10 mg by mouth at bedtime.    Historical Provider, MD  aspirin EC 81 MG EC tablet Take 1 tablet (81 mg total) by mouth daily. 05/30/13   Charolette Forward, MD  atorvastatin (LIPITOR) 20 MG tablet Take 1 tablet (20 mg total) by mouth daily  at 6 PM. 05/30/13   Charolette Forward, MD  labetalol (NORMODYNE) 200 MG tablet Take 200 mg by mouth at bedtime.     Historical Provider, MD  losartan (COZAAR) 100 MG tablet Take 100 mg by mouth at bedtime.    Historical Provider, MD  multivitamin (RENA-VIT) TABS tablet Take 1 tablet by mouth at bedtime.    Historical Provider, MD  nitroGLYCERIN (NITROSTAT) 0.4 MG SL tablet Place 1 tablet (0.4 mg total) under the tongue every 5 (five) minutes x 3 doses as needed for chest pain. 05/30/13   Charolette Forward, MD  oxyCODONE (ROXICODONE) 5 MG immediate release tablet Take 1  tablet (5 mg total) by mouth every 6 (six) hours as needed. Patient taking differently: Take 5 mg by mouth every 6 (six) hours as needed for moderate pain.  03/08/15   Samantha J Rhyne, PA-C   BP 163/96 mmHg  Pulse 60  Temp(Src) 98.3 F (36.8 C) (Oral)  Resp 20  SpO2 99% Physical Exam  Constitutional: He is oriented to person, place, and time. He appears well-developed and well-nourished.  HENT:  Head: Normocephalic and atraumatic.  Eyes: Conjunctivae and EOM are normal.  Neck: Normal range of motion. Neck supple.  Cardiovascular: Normal rate and regular rhythm.   Pulmonary/Chest: Effort normal. No respiratory distress.  Abdominal: Soft. There is no tenderness.  Musculoskeletal: Normal range of motion. He exhibits edema (LUE with some ecchymosis, see pic below). He exhibits no tenderness.  Neurological: He is alert and oriented to person, place, and time.  Left upper extremity with normal strength relative to his right. Has significant swelling as documented in the picture below but no evidence of cellulitis.  Skin: Skin is warm and dry.  Nursing note and vitals reviewed.    ED Course  Procedures (including critical care time) Labs Review Labs Reviewed - No data to display  Imaging Review No results found. I have personally reviewed and evaluated these images and lab results as part of my medical decision-making.   EKG Interpretation None      MDM   Final diagnoses:  Visit for wound check    He is here for a complication of AV graft, here for recheck. I seems that the swelling is not getting worse and is in fact improving quite a bit per the patient's report. He is not neurovascularly compromised. I feel like there is no evidence of cellulitis. He is safe for follow-up with Dr. Doren Custard in 2 weeks.  I have personally and contemperaneously reviewed labs and imaging and used in my decision making as above.   A medical screening exam was performed and I feel the patient has  had an appropriate workup for their chief complaint at this time and likelihood of emergent condition existing is low. They have been counseled on decision, discharge, follow up and which symptoms necessitate immediate return to the emergency department. They or their family verbally stated understanding and agreement with plan and discharged in stable condition.        Merrily Pew, MD 03/22/15 [phone]

## 2015-03-22 NOTE — ED Notes (Signed)
MD at bedside. 

## 2015-03-22 NOTE — ED Notes (Signed)
Pt states that he had dialysis last Wednesday and the "blew up" his stite. Pt states that he had dialysis Monday as well and had no issues. Pt states that he was told to follow up here after the episode last week. Pt has thrill and bruit present in left arm. Pulses present distaly.

## 2015-03-23 DIAGNOSIS — N186 End stage renal disease: Secondary | ICD-10-CM | POA: Diagnosis not present

## 2015-03-23 DIAGNOSIS — D509 Iron deficiency anemia, unspecified: Secondary | ICD-10-CM | POA: Diagnosis not present

## 2015-03-23 DIAGNOSIS — N2581 Secondary hyperparathyroidism of renal origin: Secondary | ICD-10-CM | POA: Diagnosis not present

## 2015-03-25 DIAGNOSIS — N186 End stage renal disease: Secondary | ICD-10-CM | POA: Diagnosis not present

## 2015-03-25 DIAGNOSIS — D509 Iron deficiency anemia, unspecified: Secondary | ICD-10-CM | POA: Diagnosis not present

## 2015-03-25 DIAGNOSIS — N2581 Secondary hyperparathyroidism of renal origin: Secondary | ICD-10-CM | POA: Diagnosis not present

## 2015-03-28 DIAGNOSIS — D509 Iron deficiency anemia, unspecified: Secondary | ICD-10-CM | POA: Diagnosis not present

## 2015-03-28 DIAGNOSIS — N186 End stage renal disease: Secondary | ICD-10-CM | POA: Diagnosis not present

## 2015-03-28 DIAGNOSIS — N2581 Secondary hyperparathyroidism of renal origin: Secondary | ICD-10-CM | POA: Diagnosis not present

## 2015-03-30 DIAGNOSIS — D509 Iron deficiency anemia, unspecified: Secondary | ICD-10-CM | POA: Diagnosis not present

## 2015-03-30 DIAGNOSIS — I120 Hypertensive chronic kidney disease with stage 5 chronic kidney disease or end stage renal disease: Secondary | ICD-10-CM | POA: Diagnosis not present

## 2015-03-30 DIAGNOSIS — Z992 Dependence on renal dialysis: Secondary | ICD-10-CM | POA: Diagnosis not present

## 2015-03-30 DIAGNOSIS — N186 End stage renal disease: Secondary | ICD-10-CM | POA: Diagnosis not present

## 2015-03-30 DIAGNOSIS — N2581 Secondary hyperparathyroidism of renal origin: Secondary | ICD-10-CM | POA: Diagnosis not present

## 2015-04-01 DIAGNOSIS — D509 Iron deficiency anemia, unspecified: Secondary | ICD-10-CM | POA: Diagnosis not present

## 2015-04-01 DIAGNOSIS — E119 Type 2 diabetes mellitus without complications: Secondary | ICD-10-CM | POA: Diagnosis not present

## 2015-04-01 DIAGNOSIS — N186 End stage renal disease: Secondary | ICD-10-CM | POA: Diagnosis not present

## 2015-04-01 DIAGNOSIS — D631 Anemia in chronic kidney disease: Secondary | ICD-10-CM | POA: Diagnosis not present

## 2015-04-01 DIAGNOSIS — N2581 Secondary hyperparathyroidism of renal origin: Secondary | ICD-10-CM | POA: Diagnosis not present

## 2015-04-04 DIAGNOSIS — N2581 Secondary hyperparathyroidism of renal origin: Secondary | ICD-10-CM | POA: Diagnosis not present

## 2015-04-04 DIAGNOSIS — D509 Iron deficiency anemia, unspecified: Secondary | ICD-10-CM | POA: Diagnosis not present

## 2015-04-04 DIAGNOSIS — E119 Type 2 diabetes mellitus without complications: Secondary | ICD-10-CM | POA: Diagnosis not present

## 2015-04-04 DIAGNOSIS — D631 Anemia in chronic kidney disease: Secondary | ICD-10-CM | POA: Diagnosis not present

## 2015-04-04 DIAGNOSIS — N186 End stage renal disease: Secondary | ICD-10-CM | POA: Diagnosis not present

## 2015-04-05 ENCOUNTER — Encounter: Payer: Self-pay | Admitting: Vascular Surgery

## 2015-04-06 ENCOUNTER — Encounter: Payer: Medicare Other | Admitting: Vascular Surgery

## 2015-04-06 DIAGNOSIS — D631 Anemia in chronic kidney disease: Secondary | ICD-10-CM | POA: Diagnosis not present

## 2015-04-06 DIAGNOSIS — N2581 Secondary hyperparathyroidism of renal origin: Secondary | ICD-10-CM | POA: Diagnosis not present

## 2015-04-06 DIAGNOSIS — D509 Iron deficiency anemia, unspecified: Secondary | ICD-10-CM | POA: Diagnosis not present

## 2015-04-06 DIAGNOSIS — E119 Type 2 diabetes mellitus without complications: Secondary | ICD-10-CM | POA: Diagnosis not present

## 2015-04-06 DIAGNOSIS — N186 End stage renal disease: Secondary | ICD-10-CM | POA: Diagnosis not present

## 2015-04-08 DIAGNOSIS — N2581 Secondary hyperparathyroidism of renal origin: Secondary | ICD-10-CM | POA: Diagnosis not present

## 2015-04-08 DIAGNOSIS — N186 End stage renal disease: Secondary | ICD-10-CM | POA: Diagnosis not present

## 2015-04-08 DIAGNOSIS — D509 Iron deficiency anemia, unspecified: Secondary | ICD-10-CM | POA: Diagnosis not present

## 2015-04-08 DIAGNOSIS — E119 Type 2 diabetes mellitus without complications: Secondary | ICD-10-CM | POA: Diagnosis not present

## 2015-04-08 DIAGNOSIS — D631 Anemia in chronic kidney disease: Secondary | ICD-10-CM | POA: Diagnosis not present

## 2015-04-11 DIAGNOSIS — E119 Type 2 diabetes mellitus without complications: Secondary | ICD-10-CM | POA: Diagnosis not present

## 2015-04-11 DIAGNOSIS — N2581 Secondary hyperparathyroidism of renal origin: Secondary | ICD-10-CM | POA: Diagnosis not present

## 2015-04-11 DIAGNOSIS — D509 Iron deficiency anemia, unspecified: Secondary | ICD-10-CM | POA: Diagnosis not present

## 2015-04-11 DIAGNOSIS — D631 Anemia in chronic kidney disease: Secondary | ICD-10-CM | POA: Diagnosis not present

## 2015-04-11 DIAGNOSIS — N186 End stage renal disease: Secondary | ICD-10-CM | POA: Diagnosis not present

## 2015-04-13 DIAGNOSIS — D631 Anemia in chronic kidney disease: Secondary | ICD-10-CM | POA: Diagnosis not present

## 2015-04-13 DIAGNOSIS — D509 Iron deficiency anemia, unspecified: Secondary | ICD-10-CM | POA: Diagnosis not present

## 2015-04-13 DIAGNOSIS — E119 Type 2 diabetes mellitus without complications: Secondary | ICD-10-CM | POA: Diagnosis not present

## 2015-04-13 DIAGNOSIS — N186 End stage renal disease: Secondary | ICD-10-CM | POA: Diagnosis not present

## 2015-04-13 DIAGNOSIS — N2581 Secondary hyperparathyroidism of renal origin: Secondary | ICD-10-CM | POA: Diagnosis not present

## 2015-04-15 DIAGNOSIS — D509 Iron deficiency anemia, unspecified: Secondary | ICD-10-CM | POA: Diagnosis not present

## 2015-04-15 DIAGNOSIS — N2581 Secondary hyperparathyroidism of renal origin: Secondary | ICD-10-CM | POA: Diagnosis not present

## 2015-04-15 DIAGNOSIS — N186 End stage renal disease: Secondary | ICD-10-CM | POA: Diagnosis not present

## 2015-04-15 DIAGNOSIS — E119 Type 2 diabetes mellitus without complications: Secondary | ICD-10-CM | POA: Diagnosis not present

## 2015-04-15 DIAGNOSIS — D631 Anemia in chronic kidney disease: Secondary | ICD-10-CM | POA: Diagnosis not present

## 2015-04-18 ENCOUNTER — Encounter: Payer: Self-pay | Admitting: Vascular Surgery

## 2015-04-18 DIAGNOSIS — E119 Type 2 diabetes mellitus without complications: Secondary | ICD-10-CM | POA: Diagnosis not present

## 2015-04-18 DIAGNOSIS — N186 End stage renal disease: Secondary | ICD-10-CM | POA: Diagnosis not present

## 2015-04-18 DIAGNOSIS — D509 Iron deficiency anemia, unspecified: Secondary | ICD-10-CM | POA: Diagnosis not present

## 2015-04-18 DIAGNOSIS — D631 Anemia in chronic kidney disease: Secondary | ICD-10-CM | POA: Diagnosis not present

## 2015-04-18 DIAGNOSIS — N2581 Secondary hyperparathyroidism of renal origin: Secondary | ICD-10-CM | POA: Diagnosis not present

## 2015-04-20 ENCOUNTER — Ambulatory Visit (INDEPENDENT_AMBULATORY_CARE_PROVIDER_SITE_OTHER): Payer: Self-pay | Admitting: Vascular Surgery

## 2015-04-20 ENCOUNTER — Encounter: Payer: Self-pay | Admitting: Vascular Surgery

## 2015-04-20 VITALS — BP 178/93 | HR 67 | Temp 98.6°F | Resp 16 | Ht 72.0 in | Wt 226.0 lb

## 2015-04-20 DIAGNOSIS — D631 Anemia in chronic kidney disease: Secondary | ICD-10-CM | POA: Diagnosis not present

## 2015-04-20 DIAGNOSIS — D509 Iron deficiency anemia, unspecified: Secondary | ICD-10-CM | POA: Diagnosis not present

## 2015-04-20 DIAGNOSIS — E119 Type 2 diabetes mellitus without complications: Secondary | ICD-10-CM | POA: Diagnosis not present

## 2015-04-20 DIAGNOSIS — N186 End stage renal disease: Secondary | ICD-10-CM

## 2015-04-20 DIAGNOSIS — N2581 Secondary hyperparathyroidism of renal origin: Secondary | ICD-10-CM | POA: Diagnosis not present

## 2015-04-20 NOTE — Progress Notes (Signed)
Filed Vitals:   04/20/15 1543 04/20/15 1547  BP: 169/96 178/93  Pulse: 74 67  Temp: 98.6 F (37 C)   TempSrc: Oral   Resp: 16   Height: 6' (1.829 m)   Weight: 226 lb (102.513 kg)   SpO2: 97%

## 2015-04-20 NOTE — Progress Notes (Signed)
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 The skin overlying the fistula was compromised and I was asked to revise the fistula and excise the aneurysm. On 03/08/2015, he underwent excision of a complex aneurysm of his left brachiocephalic fistula. The fistula was very tortuous and I was able to excise the aneurysm and reanastomose the vein end to end. They have been using the lower part of the fistula for dialysis. This segment is also significantly aneurysmal and at some point he would like to have this addressed. He dialyzes on Monday Wednesdays and Fridays.  Current Outpatient Prescriptions  Medication Sig Dispense Refill  . amLODipine (NORVASC) 10 MG tablet Take 10 mg by mouth at bedtime.    Marland Kitchen aspirin EC 81 MG EC tablet Take 1 tablet (81 mg total) by mouth daily. 30 tablet 3  . atorvastatin (LIPITOR) 20 MG tablet Take 1 tablet (20 mg total) by mouth daily at 6 PM. 30 tablet 3  . labetalol (NORMODYNE) 200 MG tablet Take 200 mg by mouth at bedtime.     Marland Kitchen losartan (COZAAR) 100 MG tablet Take 100 mg by mouth at bedtime.    . multivitamin (RENA-VIT) TABS tablet Take 1 tablet by mouth at bedtime.    . nitroGLYCERIN (NITROSTAT) 0.4 MG SL tablet Place 1 tablet (0.4 mg total) under the tongue every 5 (five) minutes x 3 doses as needed for chest pain. 25 tablet 12  . oxyCODONE (ROXICODONE) 5 MG immediate release tablet Take 1 tablet (5 mg total) by mouth every 6 (six) hours as needed. (Patient taking differently: Take 5 mg by mouth every 6 (six) hours as needed for moderate pain. ) 20 tablet 0   No current facility-administered medications for this visit.   REVIEW OF SYSTEMS: Valu.Nieves ] denotes positive finding; [  ] denotes negative finding  CARDIOVASCULAR:  [ ]  chest pain    [ ]  dyspnea on exertion    CONSTITUTIONAL:  [ ]  fever   [ ]  chills  PHYSICAL EXAM: Filed Vitals:   04/20/15 1543 04/20/15 1547  BP: 169/96 178/93  Pulse: 74 67  Temp: 98.6 F (37 C)   TempSrc: Oral   Resp: 16   Height: 6' (1.829 m)   Weight: 226 lb (102.513 kg)   SpO2: 97%    GENERAL: The patient is a well-nourished male, in no acute distress. The vital signs are documented above. CARDIOVASCULAR: There is a regular rate and rhythm. PULMONARY: There is good air exchange bilaterally without wheezing or rales. His fistula has an excellent thrill and bruit. His incision is well healed. He has a large aneurysm in the proximal fistula.  MEDICAL ISSUES:  ANEURYSMAL LEFT BRACHIOCEPHALIC AV FISTULA: The central portion of the fistula has been revised by excising the aneurysm and re-anastomosing the fistula. I think the upper portion of the fistula can be used in 2 more weeks. Once we know that the upper portion of the fistula is functioning adequately we can plan on excising the large proximal aneurysm which may be technically challenging given its size. I plan on seeing him back in a proximally 3 weeks to discuss surgery. He knows to call sooner if he has problems.  Deitra Mayo [facility]: [phone]

## 2015-04-22 DIAGNOSIS — D509 Iron deficiency anemia, unspecified: Secondary | ICD-10-CM | POA: Diagnosis not present

## 2015-04-22 DIAGNOSIS — E119 Type 2 diabetes mellitus without complications: Secondary | ICD-10-CM | POA: Diagnosis not present

## 2015-04-22 DIAGNOSIS — N2581 Secondary hyperparathyroidism of renal origin: Secondary | ICD-10-CM | POA: Diagnosis not present

## 2015-04-22 DIAGNOSIS — N186 End stage renal disease: Secondary | ICD-10-CM | POA: Diagnosis not present

## 2015-04-22 DIAGNOSIS — D631 Anemia in chronic kidney disease: Secondary | ICD-10-CM | POA: Diagnosis not present

## 2015-04-25 DIAGNOSIS — N186 End stage renal disease: Secondary | ICD-10-CM | POA: Diagnosis not present

## 2015-04-25 DIAGNOSIS — N2581 Secondary hyperparathyroidism of renal origin: Secondary | ICD-10-CM | POA: Diagnosis not present

## 2015-04-25 DIAGNOSIS — E119 Type 2 diabetes mellitus without complications: Secondary | ICD-10-CM | POA: Diagnosis not present

## 2015-04-25 DIAGNOSIS — D509 Iron deficiency anemia, unspecified: Secondary | ICD-10-CM | POA: Diagnosis not present

## 2015-04-25 DIAGNOSIS — D631 Anemia in chronic kidney disease: Secondary | ICD-10-CM | POA: Diagnosis not present

## 2015-04-27 DIAGNOSIS — N2581 Secondary hyperparathyroidism of renal origin: Secondary | ICD-10-CM | POA: Diagnosis not present

## 2015-04-27 DIAGNOSIS — D509 Iron deficiency anemia, unspecified: Secondary | ICD-10-CM | POA: Diagnosis not present

## 2015-04-27 DIAGNOSIS — E119 Type 2 diabetes mellitus without complications: Secondary | ICD-10-CM | POA: Diagnosis not present

## 2015-04-27 DIAGNOSIS — N186 End stage renal disease: Secondary | ICD-10-CM | POA: Diagnosis not present

## 2015-04-27 DIAGNOSIS — D631 Anemia in chronic kidney disease: Secondary | ICD-10-CM | POA: Diagnosis not present

## 2015-04-29 DIAGNOSIS — D509 Iron deficiency anemia, unspecified: Secondary | ICD-10-CM | POA: Diagnosis not present

## 2015-04-29 DIAGNOSIS — Z992 Dependence on renal dialysis: Secondary | ICD-10-CM | POA: Diagnosis not present

## 2015-04-29 DIAGNOSIS — I120 Hypertensive chronic kidney disease with stage 5 chronic kidney disease or end stage renal disease: Secondary | ICD-10-CM | POA: Diagnosis not present

## 2015-04-29 DIAGNOSIS — D631 Anemia in chronic kidney disease: Secondary | ICD-10-CM | POA: Diagnosis not present

## 2015-04-29 DIAGNOSIS — E119 Type 2 diabetes mellitus without complications: Secondary | ICD-10-CM | POA: Diagnosis not present

## 2015-04-29 DIAGNOSIS — N186 End stage renal disease: Secondary | ICD-10-CM | POA: Diagnosis not present

## 2015-04-29 DIAGNOSIS — N2581 Secondary hyperparathyroidism of renal origin: Secondary | ICD-10-CM | POA: Diagnosis not present

## 2015-05-01 ENCOUNTER — Inpatient Hospital Stay (HOSPITAL_COMMUNITY): Payer: Medicare Other

## 2015-05-01 ENCOUNTER — Emergency Department (HOSPITAL_COMMUNITY): Payer: Medicare Other

## 2015-05-01 ENCOUNTER — Inpatient Hospital Stay (HOSPITAL_COMMUNITY)
Admission: EM | Admit: 2015-05-01 | Discharge: 2015-05-06 | DRG: 682 | Disposition: A | Discharge status: Home / Self Care | Payer: Medicare Other | Attending: Internal Medicine | Admitting: Internal Medicine

## 2015-05-01 ENCOUNTER — Encounter (HOSPITAL_COMMUNITY): Payer: Self-pay | Admitting: *Deleted

## 2015-05-01 DIAGNOSIS — J9601 Acute respiratory failure with hypoxia: Secondary | ICD-10-CM | POA: Diagnosis not present

## 2015-05-01 DIAGNOSIS — I2510 Atherosclerotic heart disease of native coronary artery without angina pectoris: Secondary | ICD-10-CM | POA: Diagnosis present

## 2015-05-01 DIAGNOSIS — E785 Hyperlipidemia, unspecified: Secondary | ICD-10-CM | POA: Diagnosis present

## 2015-05-01 DIAGNOSIS — I472 Ventricular tachycardia: Secondary | ICD-10-CM | POA: Diagnosis not present

## 2015-05-01 DIAGNOSIS — I10 Essential (primary) hypertension: Secondary | ICD-10-CM | POA: Diagnosis not present

## 2015-05-01 DIAGNOSIS — Z79899 Other long term (current) drug therapy: Secondary | ICD-10-CM | POA: Diagnosis not present

## 2015-05-01 DIAGNOSIS — J811 Chronic pulmonary edema: Secondary | ICD-10-CM | POA: Diagnosis present

## 2015-05-01 DIAGNOSIS — Z992 Dependence on renal dialysis: Secondary | ICD-10-CM | POA: Diagnosis not present

## 2015-05-01 DIAGNOSIS — Z833 Family history of diabetes mellitus: Secondary | ICD-10-CM | POA: Diagnosis not present

## 2015-05-01 DIAGNOSIS — Z7982 Long term (current) use of aspirin: Secondary | ICD-10-CM | POA: Diagnosis not present

## 2015-05-01 DIAGNOSIS — I252 Old myocardial infarction: Secondary | ICD-10-CM | POA: Diagnosis not present

## 2015-05-01 DIAGNOSIS — Z79891 Long term (current) use of opiate analgesic: Secondary | ICD-10-CM

## 2015-05-01 DIAGNOSIS — R0602 Shortness of breath: Secondary | ICD-10-CM | POA: Diagnosis not present

## 2015-05-01 DIAGNOSIS — R0789 Other chest pain: Secondary | ICD-10-CM | POA: Diagnosis not present

## 2015-05-01 DIAGNOSIS — D631 Anemia in chronic kidney disease: Secondary | ICD-10-CM | POA: Diagnosis present

## 2015-05-01 DIAGNOSIS — D649 Anemia, unspecified: Secondary | ICD-10-CM | POA: Diagnosis present

## 2015-05-01 DIAGNOSIS — R079 Chest pain, unspecified: Secondary | ICD-10-CM | POA: Insufficient documentation

## 2015-05-01 DIAGNOSIS — N186 End stage renal disease: Secondary | ICD-10-CM | POA: Diagnosis present

## 2015-05-01 DIAGNOSIS — I120 Hypertensive chronic kidney disease with stage 5 chronic kidney disease or end stage renal disease: Secondary | ICD-10-CM | POA: Diagnosis not present

## 2015-05-01 DIAGNOSIS — J810 Acute pulmonary edema: Secondary | ICD-10-CM | POA: Diagnosis present

## 2015-05-01 DIAGNOSIS — D696 Thrombocytopenia, unspecified: Secondary | ICD-10-CM | POA: Diagnosis present

## 2015-05-01 DIAGNOSIS — I501 Left ventricular failure: Secondary | ICD-10-CM | POA: Diagnosis not present

## 2015-05-01 DIAGNOSIS — I160 Hypertensive urgency: Secondary | ICD-10-CM | POA: Diagnosis present

## 2015-05-01 DIAGNOSIS — Z8249 Family history of ischemic heart disease and other diseases of the circulatory system: Secondary | ICD-10-CM | POA: Diagnosis not present

## 2015-05-01 DIAGNOSIS — N2581 Secondary hyperparathyroidism of renal origin: Secondary | ICD-10-CM | POA: Diagnosis present

## 2015-05-01 DIAGNOSIS — K219 Gastro-esophageal reflux disease without esophagitis: Secondary | ICD-10-CM | POA: Diagnosis present

## 2015-05-01 DIAGNOSIS — N189 Chronic kidney disease, unspecified: Secondary | ICD-10-CM | POA: Diagnosis not present

## 2015-05-01 DIAGNOSIS — E8770 Fluid overload, unspecified: Secondary | ICD-10-CM | POA: Diagnosis not present

## 2015-05-01 LAB — CBC
HEMATOCRIT: 34.9 % — AB (ref 39.0–52.0)
HEMOGLOBIN: 11.1 g/dL — AB (ref 13.0–17.0)
MCH: 29.4 pg (ref 26.0–34.0)
MCHC: 31.8 g/dL (ref 30.0–36.0)
MCV: 92.3 fL (ref 78.0–100.0)
Platelets: 125 10*3/uL — ABNORMAL LOW (ref 150–400)
RBC: 3.78 MIL/uL — ABNORMAL LOW (ref 4.22–5.81)
RDW: 14.4 % (ref 11.5–15.5)
WBC: 6.4 10*3/uL (ref 4.0–10.5)

## 2015-05-01 LAB — BASIC METABOLIC PANEL
ANION GAP: 13 (ref 5–15)
BUN: 44 mg/dL — ABNORMAL HIGH (ref 6–20)
CALCIUM: 9.1 mg/dL (ref 8.9–10.3)
CHLORIDE: 100 mmol/L — AB (ref 101–111)
CO2: 27 mmol/L (ref 22–32)
Creatinine, Ser: 15.93 mg/dL — ABNORMAL HIGH (ref 0.61–1.24)
GFR calc Af Amer: 3 mL/min — ABNORMAL LOW (ref >60)
GFR calc non Af Amer: 3 mL/min — ABNORMAL LOW (ref >60)
GLUCOSE: 86 mg/dL (ref 65–99)
Potassium: 5 mmol/L (ref 3.5–5.1)
Sodium: 140 mmol/L (ref 135–145)

## 2015-05-01 LAB — I-STAT TROPONIN, ED: TROPONIN I, POC: 0.13 ng/mL — AB (ref 0.00–0.08)

## 2015-05-01 MED ORDER — FENTANYL CITRATE (PF) 100 MCG/2ML IJ SOLN
50.0000 ug | Freq: Once | INTRAMUSCULAR | Status: AC
  Administered 2015-05-01: 50 ug via INTRAVENOUS
  Filled 2015-05-01: qty 2

## 2015-05-01 MED ORDER — NITROGLYCERIN 0.4 MG SL SUBL
0.4000 mg | SUBLINGUAL_TABLET | SUBLINGUAL | Status: DC | PRN
  Administered 2015-05-01 – 2015-05-02 (×6): 0.4 mg via SUBLINGUAL
  Filled 2015-05-01: qty 1

## 2015-05-01 MED ORDER — IOHEXOL 350 MG/ML SOLN
100.0000 mL | Freq: Once | INTRAVENOUS | Status: AC | PRN
  Administered 2015-05-01: 100 mL via INTRAVENOUS

## 2015-05-01 NOTE — ED Notes (Signed)
Spoke with admit Doctor will change bed assignment to step down.

## 2015-05-01 NOTE — ED Notes (Signed)
Thrill and bruit present in left arm fistula

## 2015-05-01 NOTE — ED Notes (Addendum)
Pt reports SOB when flat. Pt states that this has been going on since this weekend. Sob is intermittent. Worsening today. Denies Nausea states that he vomited 1 hour ago though. Reports central chest pain as well.  Dialysis pt MWF with last treatment Friday for 4.25 hours.

## 2015-05-01 NOTE — ED Provider Notes (Signed)
CSN: [id number]     Arrival date & time 05/01/15  1839 History   First MD Initiated Contact with Patient 05/01/15 1844     Chief Complaint  Patient presents with  . Shortness of Breath    Patient is a 59 y.o. male presenting with shortness of breath. The history is provided by the patient, a relative, medical records and the EMS personnel.  Shortness of Breath Severity:  Moderate Onset quality:  Gradual Timing:  Constant Progression:  Worsening Chronicity:  New Context comment:  Since HD on friday Relieved by:  Sitting up and oxygen Worsened by:  Coughing (lying flat) Ineffective treatments:  Rest Associated symptoms: chest pain (intermittent, denies upon my initial eval), cough, neck pain (bilat SCM with coughing episodes) and vomiting   Associated symptoms: no abdominal pain, no diaphoresis, no fever, no hemoptysis and no rash   Risk factors comment:  ESRD on HD, HTN poorly controlled, noncompliance, DM, CHF   Past Medical History  Diagnosis Date  . Hypertension   . Dialysis patient ([place])   . Chronic kidney disease   . GERD (gastroesophageal reflux disease)   . Myocardial infarction [facility])     lived in [place] maybe 7 years ago  . History of blood transfusion    Past Surgical History  Procedure Laterality Date  . Arteriovenous graft placement    . Small intestine surgery    . Small intestine surgery    . Resection of arteriovenous fistula aneurysm Left 02/05/2013    Procedure: REPAIR OF ANEURYSM OF LEFT ARM ARTERIOVENOUS FISTULA ;  Surgeon: Serafina Mitchell, MD;  Location: [facility] OR;  Service: Vascular;  Laterality: Left;  . Resection of arteriovenous fistula aneurysm Left 03/08/2015    Procedure: REPAIR OF LEFT ARTERIOVENOUS FISTULA PSEUDOANEURYSM;  Surgeon: Angelia Mould, MD;  Location: [facility] OR;  Service: Vascular;  Laterality: Left;   Family History  Problem Relation Age of Onset  . Hypertension Mother   . Other Mother     varicose veins  . Diabetes Father   .  Hypertension Father   . Hypertension Sister   . Other Sister     varicose veins  . Other Brother     varicose veins   Social History  Substance Use Topics  . Smoking status: Never Smoker   . Smokeless tobacco: Never Used  . Alcohol Use: No    Review of Systems  Constitutional: Negative for fever and diaphoresis.  HENT: Negative for rhinorrhea.   Eyes: Negative for visual disturbance.  Respiratory: Positive for cough and shortness of breath. Negative for hemoptysis.   Cardiovascular: Positive for chest pain (intermittent, denies upon my initial eval).  Gastrointestinal: Positive for nausea and vomiting. Negative for abdominal pain.  Genitourinary: Negative for decreased urine volume.  Musculoskeletal: Positive for neck pain (bilat SCM with coughing episodes).  Skin: Negative for rash.  Allergic/Immunologic: Negative for immunocompromised state.  Neurological: Negative for syncope.  Psychiatric/Behavioral: Negative for confusion.      Allergies  Review of patient's allergies indicates no known allergies.  Home Medications   Prior to Admission medications   Medication Sig Start Date End Date Taking? Authorizing Provider  amLODipine (NORVASC) 10 MG tablet Take 10 mg by mouth at bedtime.    Historical Provider, MD  aspirin EC 81 MG EC tablet Take 1 tablet (81 mg total) by mouth daily. 05/30/13   Charolette Forward, MD  atorvastatin (LIPITOR) 20 MG tablet Take 1 tablet (20 mg total) by mouth daily at 6  PM. 05/30/13   Charolette Forward, MD  labetalol (NORMODYNE) 200 MG tablet Take 200 mg by mouth at bedtime.     Historical Provider, MD  losartan (COZAAR) 100 MG tablet Take 100 mg by mouth at bedtime.    Historical Provider, MD  multivitamin (RENA-VIT) TABS tablet Take 1 tablet by mouth at bedtime.    Historical Provider, MD  nitroGLYCERIN (NITROSTAT) 0.4 MG SL tablet Place 1 tablet (0.4 mg total) under the tongue every 5 (five) minutes x 3 doses as needed for chest pain. 05/30/13   Charolette Forward, MD  oxyCODONE (ROXICODONE) 5 MG immediate release tablet Take 1 tablet (5 mg total) by mouth every 6 (six) hours as needed. Patient taking differently: Take 5 mg by mouth every 6 (six) hours as needed for moderate pain.  03/08/15   Samantha J Rhyne, PA-C   BP 187/97 mmHg  Pulse 77  Temp(Src) 98.5 F (36.9 C) (Oral)  Resp 15  Ht 6' (1.829 m)  Wt 227 lb 4.7 oz (103.1 kg)  BMI 30.82 kg/m2  SpO2 98% Physical Exam  Constitutional: He is oriented to person, place, and time. He appears well-developed and well-nourished. No distress.  HENT:  Head: Normocephalic and atraumatic.  Eyes: Right eye exhibits no discharge. Left eye exhibits no discharge.  Neck: No tracheal deviation present.  Cardiovascular: Normal rate and regular rhythm.   Pulmonary/Chest: Effort normal. No respiratory distress. He has rales. He exhibits no tenderness.  Abdominal: Soft. He exhibits no distension. There is no tenderness.  Musculoskeletal: He exhibits no edema.  Neurological: He is alert and oriented to person, place, and time.  Skin: Skin is warm and dry.  Psychiatric: He has a normal mood and affect. His behavior is normal.    ED Course  Procedures (including critical care time) Labs Review Labs Reviewed  BASIC METABOLIC PANEL - Abnormal; Notable for the following:    Chloride 100 (*)    BUN 44 (*)    Creatinine, Ser 15.93 (*)    GFR calc non Af Amer 3 (*)    GFR calc Af Amer 3 (*)    All other components within normal limits  CBC - Abnormal; Notable for the following:    RBC 3.78 (*)    Hemoglobin 11.1 (*)    HCT 34.9 (*)    Platelets 125 (*)    All other components within normal limits  I-STAT TROPOININ, ED - Abnormal; Notable for the following:    Troponin i, poc 0.13 (*)    All other components within normal limits  MRSA PCR SCREENING  TROPONIN I  TROPONIN I  TROPONIN I  CBC  CREATININE, SERUM  COMPREHENSIVE METABOLIC PANEL  CBC WITH DIFFERENTIAL/PLATELET    Imaging Review Dg  Chest 2 View  05/01/2015   CLINICAL DATA:  Shortness of breath, all worsening.  EXAM: CHEST  2 VIEW  COMPARISON:  05/29/2013  FINDINGS: There is bilateral diffuse interstitial thickening. There is no pleural effusion or pneumothorax. There is stable cardiomegaly.  The osseous structures are unremarkable.  IMPRESSION: Findings most concerning for mild pulmonary edema.   Electronically Signed   By: Kathreen Devoid   On: 05/01/2015 20:04   Ct Angio Chest Aorta W/cm &/or Wo/cm  05/01/2015   CLINICAL DATA:  Acute onset of generalized chest pain. Concern for thoracic aortic dissection. Initial encounter.  EXAM: CT ANGIOGRAPHY CHEST WITH CONTRAST  TECHNIQUE: Multidetector CT imaging of the chest was performed using the standard protocol during bolus administration of  intravenous contrast. Multiplanar CT image reconstructions and MIPs were obtained to evaluate the vascular anatomy.  CONTRAST:  130mL OMNIPAQUE IOHEXOL 350 MG/ML SOLN  COMPARISON:  Chest radiograph performed earlier today at 7:21 p.m.  FINDINGS: There is no evidence of aortic dissection. There is no evidence of aneurysmal dilatation. Minimal calcification is noted along the aortic arch. No mural thrombus is seen. There is no evidence of luminal narrowing.  There is no evidence of central pulmonary embolus.  Trace bilateral pleural effusions are noted. Hazy diffuse bilateral airspace opacification is seen, with underlying interstitial prominence, compatible with pulmonary edema. There is no evidence of pneumothorax. No masses are identified; no abnormal focal contrast enhancement is seen.  The heart is enlarged. No significant pericardial effusion is seen. No mediastinal lymphadenopathy is appreciated. Mild calcification is noted at the mitral and aortic valves. Scattered coronary artery calcifications are seen. The great vessels are grossly unremarkable in appearance. No axillary lymphadenopathy is seen. The visualized portions of the thyroid gland are  unremarkable in appearance.  The visualized portions of the liver and spleen are unremarkable. The kidneys are diffusely atrophic, with numerous cysts of varying size, reflecting underlying chronic renal disease. The visualized portions of the gallbladder, pancreas and adrenal glands are grossly unremarkable.  No acute osseous abnormalities are seen.  Review of the MIP images confirms the above findings.  IMPRESSION: 1. No evidence of aortic dissection. No evidence of aneurysmal dilatation. No luminal narrowing seen. Minimal calcification noted along the aortic arch. 2. No evidence of central pulmonary embolus. 3. Hazy diffuse bilateral airspace opacification, with underlying interstitial prominence and trace bilateral pleural effusions, compatible with pulmonary edema. 4. Cardiomegaly noted. 5. Scattered coronary artery calcifications seen. Mild calcification at the mitral and aortic valves. 6. Diffuse renal atrophy and scattered renal cysts noted, reflecting chronic renal disease.   Electronically Signed   By: Garald Balding M.D.   On: 05/01/2015 23:58   I have personally reviewed and evaluated these images and lab results as part of my medical decision-making.   EKG Interpretation None      MDM   Final diagnoses:  End stage renal disease ([place])  Acute pulmonary edema ([place])    59 year old male with history of end-stage renal disease on HD (MWF, not missed any), hypertension poorly controlled presenting with progressive SOB, intermittent CP, bilat SCM area neck pain from cough. AF. HTN, states did not take home meds today. Oxygenating and ventilating well without supplemental oxygen though placed on 2L [place] prior to arrival for sx improvement. Workup notable for elevated troponin (though likely near patient's baseline elevation), pulmonary edema on chest x-ray. No emergent need for HD given no acidosis or severe fluid overload or hyperkalemia. However, given patient's severity of symptoms and pulmonary  edema will admit for further management and likely HD in AM. Admitted in stable condition.  Case discussed with Dr. Alvino Chapel, who oversaw management of this patient.   Ivin Booty, MD 05/02/15 0136  Davonna Belling, MD 05/02/15 [phone]

## 2015-05-01 NOTE — ED Notes (Signed)
Admit Doctor at bedside.  

## 2015-05-01 NOTE — H&P (Signed)
[facility] History and Physical  Jon [name] [id number] DOB: [birth year] DOA: 05/01/2015  Referring physician: D.Beatty. PCP: Jon [name], MD  Specialists: Nephrologist.  Chief Complaint: Chest pain and shortness of breath.  HPI: Jon [name] is a 59 y.o. male history of ESRD on hemodialysis on Monday Wednesday and Friday, hypertension, chronic anemia presents to the ER because of chest pain and shortness of breath. Patient states he has been having shortness of breath since dialysis on Friday. Shortness of breath is present even at rest. Denies any productive cough fever chills. Started developing chest pressure like symptoms radiating to his neck since yesterday. In the ER patient was found to have markedly elevated blood pressure. Chest x-ray shows congestion. Patient has been admitted for further management of chest pain and shortness of breath. Patient states he has not missed his dialysis.   Review of Systems: As presented in the history of presenting illness, rest negative.  Past Medical History  Diagnosis Date  . Hypertension   . Dialysis patient ([facility])   . Chronic kidney disease   . GERD (gastroesophageal reflux disease)   . Myocardial infarction [facility])     lived in [place] maybe 7 years ago  . History of blood transfusion    Past Surgical History  Procedure Laterality Date  . Arteriovenous graft placement    . Small intestine surgery    . Small intestine surgery    . Resection of arteriovenous fistula aneurysm Left 02/05/2013    Procedure: REPAIR OF ANEURYSM OF LEFT ARM ARTERIOVENOUS FISTULA ;  Surgeon: Nada Libman, MD;  Location: [facility] OR;  Service: Vascular;  Laterality: Left;  . Resection of arteriovenous fistula aneurysm Left 03/08/2015    Procedure: REPAIR OF LEFT ARTERIOVENOUS FISTULA PSEUDOANEURYSM;  Surgeon: Chuck Hint, MD;  Location: [facility] OR;  Service: Vascular;  Laterality: Left;   Social History:  reports that he has never smoked. He has  never used smokeless tobacco. He reports that he does not drink alcohol or use illicit drugs. Where does patient live home. Can patient participate in ADLs? Yes.  No Known Allergies  Family History:  Family History  Problem Relation Age of Onset  . Hypertension Mother   . Other Mother     varicose veins  . Diabetes Father   . Hypertension Father   . Hypertension Sister   . Other Sister     varicose veins  . Other Brother     varicose veins      Prior to Admission medications   Medication Sig Start Date End Date Taking? Authorizing Provider  amLODipine (NORVASC) 10 MG tablet Take 10 mg by mouth at bedtime.    Historical Provider, MD  aspirin EC 81 MG EC tablet Take 1 tablet (81 mg total) by mouth daily. 05/30/13   Rinaldo Cloud, MD  atorvastatin (LIPITOR) 20 MG tablet Take 1 tablet (20 mg total) by mouth daily at 6 PM. 05/30/13   Rinaldo Cloud, MD  labetalol (NORMODYNE) 200 MG tablet Take 200 mg by mouth at bedtime.     Historical Provider, MD  losartan (COZAAR) 100 MG tablet Take 100 mg by mouth at bedtime.    Historical Provider, MD  multivitamin (RENA-VIT) TABS tablet Take 1 tablet by mouth at bedtime.    Historical Provider, MD  nitroGLYCERIN (NITROSTAT) 0.4 MG SL tablet Place 1 tablet (0.4 mg total) under the tongue every 5 (five) minutes x 3 doses as needed for chest pain. 05/30/13   Rinaldo Cloud, MD  oxyCODONE (  ROXICODONE) 5 MG immediate release tablet Take 1 tablet (5 mg total) by mouth every 6 (six) hours as needed. Patient taking differently: Take 5 mg by mouth every 6 (six) hours as needed for moderate pain.  03/08/15   Dara Lords, PA-C    Physical Exam: Filed Vitals:   05/01/15 1920 05/01/15 2220 05/01/15 2240 05/01/15 2245  BP: 182/94 200/100 178/100 192/100  Pulse: 81 81 82 78  Temp:      TempSrc:      Resp: 19 18 19 18   Height:      Weight:      SpO2: 99% 99% 96% 98%     General:  Moderately built and nourished.  Eyes: Anicteric no pallor.  ENT: No  discharge from the ears eyes nose and mouth.  Neck: JVD mildly elevated. No mass felt.  Cardiovascular: S1 and S2 heard.  Respiratory: No rhonchi or crepitations.  Abdomen: Soft nontender bowel sounds present.  Skin: No rash.  Musculoskeletal: No edema.  Psychiatric: Appears normal.  Neurologic: Alert awake oriented to time place and person. Moves all extremities.  Labs on Admission:  Basic Metabolic Panel:  Recent Labs Lab 05/01/15 1907  NA 140  K 5.0  CL 100*  CO2 27  GLUCOSE 86  BUN 44*  CREATININE 15.93*  CALCIUM 9.1   Liver Function Tests: No results for input(s): AST, ALT, ALKPHOS, BILITOT, PROT, ALBUMIN in the last 168 hours. No results for input(s): LIPASE, AMYLASE in the last 168 hours. No results for input(s): AMMONIA in the last 168 hours. CBC:  Recent Labs Lab 05/01/15 1907  WBC 6.4  HGB 11.1*  HCT 34.9*  MCV 92.3  PLT 125*   Cardiac Enzymes: No results for input(s): CKTOTAL, CKMB, CKMBINDEX, TROPONINI in the last 168 hours.  BNP (last 3 results) No results for input(s): BNP in the last 8760 hours.  ProBNP (last 3 results) No results for input(s): PROBNP in the last 8760 hours.  CBG: No results for input(s): GLUCAP in the last 168 hours.  Radiological Exams on Admission: Dg Chest 2 View  05/01/2015   CLINICAL DATA:  Shortness of breath, all worsening.  EXAM: CHEST  2 VIEW  COMPARISON:  05/29/2013  FINDINGS: There is bilateral diffuse interstitial thickening. There is no pleural effusion or pneumothorax. There is stable cardiomegaly.  The osseous structures are unremarkable.  IMPRESSION: Findings most concerning for mild pulmonary edema.   Electronically Signed   By: Elige Ko   On: 05/01/2015 20:04    EKG: Independently reviewed. Normal sinus rhythm with LVH and abnormal T waves.  Assessment/Plan Principal Problem:   Acute respiratory failure with hypoxia (HCC) Active Problems:   End stage renal disease ([facility])   Pulmonary edema    Hypertensive urgency   Chronic anemia   1. Acute respiratory failure and hypoxia - probably secondary to fluid overload in a patient with ESRD on hemodialysis. Presently not in distress. Will consult nephrology in a.m. for dialysis. 2. Chest pain - patient has had normal stress test in 2014. Chest pain may be related to patient's elevated blood pressure and pulmonary edema. We will cycle cardiac markers. Since patient is having chest pain radiating to his back and neck have ordered CT angiogram of the chest. 3. Hypertensive urgency - continue home medicines and I have placed patient on when necessary IV hydralazine. I think patient's blood pressure will improve with dialysis. 4. Chronic anemia probably from ESRD - follow CBC.  I have reviewed patient's old  charts labs.   DVT Prophylaxis Lovenox.  Code Status: Full code.  Family Communication: Discussed with patient's family at the bedside.  Disposition Plan: Admit to inpatient.    Mylani Gentry N. [facility] Pager [phone].  If 7PM-7AM, please contact night-coverage [email or web address] Password [email or web address] 05/01/2015, 10:53 PM

## 2015-05-01 NOTE — ED Notes (Signed)
Doctor at bedside.

## 2015-05-02 ENCOUNTER — Ambulatory Visit (HOSPITAL_COMMUNITY): Payer: Medicare Other

## 2015-05-02 LAB — CBC
HCT: 34 % — ABNORMAL LOW (ref 39.0–52.0)
HEMOGLOBIN: 10.8 g/dL — AB (ref 13.0–17.0)
MCH: 29.3 pg (ref 26.0–34.0)
MCHC: 31.8 g/dL (ref 30.0–36.0)
MCV: 92.4 fL (ref 78.0–100.0)
Platelets: 114 10*3/uL — ABNORMAL LOW (ref 150–400)
RBC: 3.68 MIL/uL — ABNORMAL LOW (ref 4.22–5.81)
RDW: 14.4 % (ref 11.5–15.5)
WBC: 6.4 10*3/uL (ref 4.0–10.5)

## 2015-05-02 LAB — CBC WITH DIFFERENTIAL/PLATELET
BASOS ABS: 0 10*3/uL (ref 0.0–0.1)
BASOS PCT: 0 %
EOS ABS: 0.2 10*3/uL (ref 0.0–0.7)
EOS PCT: 3 %
HCT: 33.2 % — ABNORMAL LOW (ref 39.0–52.0)
Hemoglobin: 10.6 g/dL — ABNORMAL LOW (ref 13.0–17.0)
Lymphocytes Relative: 24 %
Lymphs Abs: 1.6 10*3/uL (ref 0.7–4.0)
MCH: 29.5 pg (ref 26.0–34.0)
MCHC: 31.9 g/dL (ref 30.0–36.0)
MCV: 92.5 fL (ref 78.0–100.0)
MONO ABS: 0.4 10*3/uL (ref 0.1–1.0)
Monocytes Relative: 6 %
NEUTROS ABS: 4.5 10*3/uL (ref 1.7–7.7)
Neutrophils Relative %: 67 %
PLATELETS: 118 10*3/uL — AB (ref 150–400)
RBC: 3.59 MIL/uL — ABNORMAL LOW (ref 4.22–5.81)
RDW: 14.4 % (ref 11.5–15.5)
WBC: 6.7 10*3/uL (ref 4.0–10.5)

## 2015-05-02 LAB — TROPONIN I
TROPONIN I: 0.15 ng/mL — AB (ref <0.031)
Troponin I: 0.13 ng/mL — ABNORMAL HIGH (ref <0.031)
Troponin I: 0.11 ng/mL — ABNORMAL HIGH (ref <0.031)

## 2015-05-02 LAB — COMPREHENSIVE METABOLIC PANEL
ALBUMIN: 3.3 g/dL — AB (ref 3.5–5.0)
ALK PHOS: 38 U/L (ref 38–126)
ALT: 14 U/L — AB (ref 17–63)
ANION GAP: 12 (ref 5–15)
AST: 17 U/L (ref 15–41)
BILIRUBIN TOTAL: 0.9 mg/dL (ref 0.3–1.2)
BUN: 52 mg/dL — AB (ref 6–20)
CALCIUM: 8.7 mg/dL — AB (ref 8.9–10.3)
CO2: 28 mmol/L (ref 22–32)
CREATININE: 17.5 mg/dL — AB (ref 0.61–1.24)
Chloride: 101 mmol/L (ref 101–111)
GFR calc Af Amer: 3 mL/min — ABNORMAL LOW (ref >60)
GFR calc non Af Amer: 3 mL/min — ABNORMAL LOW (ref >60)
GLUCOSE: 78 mg/dL (ref 65–99)
Potassium: 4.9 mmol/L (ref 3.5–5.1)
Sodium: 141 mmol/L (ref 135–145)
TOTAL PROTEIN: 6.4 g/dL — AB (ref 6.5–8.1)

## 2015-05-02 LAB — CREATININE, SERUM
CREATININE: 16.82 mg/dL — AB (ref 0.61–1.24)
GFR calc Af Amer: 3 mL/min — ABNORMAL LOW (ref >60)
GFR calc non Af Amer: 3 mL/min — ABNORMAL LOW (ref >60)

## 2015-05-02 LAB — MRSA PCR SCREENING: MRSA BY PCR: NEGATIVE

## 2015-05-02 MED ORDER — NITROGLYCERIN 0.4 MG SL SUBL: 0.4000 mg | SUBLINGUAL_TABLET | SUBLINGUAL | Status: DC | PRN

## 2015-05-02 MED ORDER — CALCITRIOL 0.25 MCG PO CAPS
2.2500 ug | ORAL_CAPSULE | ORAL | Status: DC
  Administered 2015-05-04 – 2015-05-06 (×2): 2.25 ug via ORAL
  Filled 2015-05-02: qty 1
  Filled 2015-05-02: qty 4
  Filled 2015-05-02: qty 1

## 2015-05-02 MED ORDER — LABETALOL HCL 200 MG PO TABS
200.0000 mg | ORAL_TABLET | Freq: Every day | ORAL | Status: DC
  Administered 2015-05-02 – 2015-05-04 (×4): 200 mg via ORAL
  Filled 2015-05-02 (×5): qty 1

## 2015-05-02 MED ORDER — ENOXAPARIN SODIUM 30 MG/0.3ML ~~LOC~~ SOLN
30.0000 mg | Freq: Every day | SUBCUTANEOUS | Status: DC
  Administered 2015-05-04 – 2015-05-05 (×2): 30 mg via SUBCUTANEOUS
  Filled 2015-05-02 (×5): qty 0.3

## 2015-05-02 MED ORDER — ACETAMINOPHEN 650 MG RE SUPP: 650.0000 mg | Freq: Four times a day (QID) | RECTAL | Status: DC | PRN

## 2015-05-02 MED ORDER — SODIUM CHLORIDE 0.9 % IJ SOLN
3.0000 mL | Freq: Two times a day (BID) | INTRAMUSCULAR | Status: DC
  Administered 2015-05-02 – 2015-05-04 (×6): 3 mL via INTRAVENOUS

## 2015-05-02 MED ORDER — LABETALOL HCL 5 MG/ML IV SOLN
10.0000 mg | INTRAVENOUS | Status: DC | PRN
  Administered 2015-05-02 – 2015-05-05 (×4): 10 mg via INTRAVENOUS
  Filled 2015-05-02 (×4): qty 4

## 2015-05-02 MED ORDER — MORPHINE SULFATE (PF) 2 MG/ML IV SOLN: 1.0000 mg | INTRAVENOUS | Status: DC | PRN

## 2015-05-02 MED ORDER — LOSARTAN POTASSIUM 50 MG PO TABS
100.0000 mg | ORAL_TABLET | Freq: Every day | ORAL | Status: DC
  Administered 2015-05-02 – 2015-05-05 (×5): 100 mg via ORAL
  Filled 2015-05-02 (×6): qty 2

## 2015-05-02 MED ORDER — ASPIRIN EC 325 MG PO TBEC
325.0000 mg | DELAYED_RELEASE_TABLET | Freq: Every day | ORAL | Status: DC
  Administered 2015-05-02 – 2015-05-06 (×5): 325 mg via ORAL
  Filled 2015-05-02 (×6): qty 1

## 2015-05-02 MED ORDER — AMLODIPINE BESYLATE 10 MG PO TABS
10.0000 mg | ORAL_TABLET | Freq: Every day | ORAL | Status: DC
  Administered 2015-05-02 – 2015-05-05 (×5): 10 mg via ORAL
  Filled 2015-05-02 (×6): qty 1

## 2015-05-02 MED ORDER — NITROGLYCERIN 0.4 MG SL SUBL
SUBLINGUAL_TABLET | SUBLINGUAL | Status: AC
  Filled 2015-05-02: qty 1

## 2015-05-02 MED ORDER — RENA-VITE PO TABS
1.0000 | ORAL_TABLET | Freq: Every day | ORAL | Status: DC
  Administered 2015-05-02 – 2015-05-05 (×5): 1 via ORAL
  Filled 2015-05-02 (×6): qty 1

## 2015-05-02 MED ORDER — ONDANSETRON HCL 4 MG PO TABS: 4.0000 mg | ORAL_TABLET | Freq: Four times a day (QID) | ORAL | Status: DC | PRN

## 2015-05-02 MED ORDER — ONDANSETRON HCL 4 MG/2ML IJ SOLN: 4.0000 mg | Freq: Four times a day (QID) | INTRAMUSCULAR | Status: DC | PRN

## 2015-05-02 MED ORDER — ACETAMINOPHEN 325 MG PO TABS: 650.0000 mg | ORAL_TABLET | Freq: Four times a day (QID) | ORAL | Status: DC | PRN

## 2015-05-02 MED ORDER — SODIUM CHLORIDE 0.9 % IV SOLN
62.5000 mg | INTRAVENOUS | Status: DC
  Administered 2015-05-02 – 2015-05-06 (×3): 62.5 mg via INTRAVENOUS
  Filled 2015-05-02 (×8): qty 5

## 2015-05-02 MED ORDER — OXYCODONE HCL 5 MG PO TABS
5.0000 mg | ORAL_TABLET | Freq: Four times a day (QID) | ORAL | Status: DC | PRN
  Administered 2015-05-05: 5 mg via ORAL
  Filled 2015-05-02: qty 1

## 2015-05-02 MED ORDER — NITROGLYCERIN 0.4 MG SL SUBL
SUBLINGUAL_TABLET | SUBLINGUAL | Status: AC
  Administered 2015-05-02: 0.4 mg via SUBLINGUAL
  Filled 2015-05-02: qty 1

## 2015-05-02 MED ORDER — ATORVASTATIN CALCIUM 20 MG PO TABS
20.0000 mg | ORAL_TABLET | Freq: Every day | ORAL | Status: DC
  Administered 2015-05-02 – 2015-05-05 (×4): 20 mg via ORAL
  Filled 2015-05-02 (×4): qty 1

## 2015-05-02 NOTE — Consult Note (Signed)
Reason for Consult:decompensated CHF/minimally elevated troponin I Referring Physician:[facility] hospitalist  Jon [name] is an 59 y.o. male.  HPI Jon [name] is 59 year old male with past medical history significant for coronary artery disease, history of MI in the past.  No invasive workup in the past, hypertension, end-stage renal disease on hemodialysis, hyperlipidemia,anemia of chronic disease, GERD, , was admitted because of vague chest pain off and on associated with shortness of breath.  Her last few days.  States occasionally chest pain gets worse with deep breathing.  Also gives history of exertional tired feeling in the chest.  Denies nausea, vomiting, diaphoresis.  Denies palpitation, lightheadedness or syncope.  Patient was noted to be mildly volume overloaded and was noted to have minimally elevated troponin I EKG showed normal sinus rhythm with minor T-wave inversion in lateral leads which were present in prior EKGs.  No new acute ischemic changes were noted.patient had CT and do the EGD, which showed no evidence of PE or dissection showed coronary artery calcification.  Past Medical History  Diagnosis Date  . Hypertension   . Dialysis patient ([place])   . Chronic kidney disease   . GERD (gastroesophageal reflux disease)   . Myocardial infarction [facility])     lived in [place] maybe 7 years ago  . History of blood transfusion     Past Surgical History  Procedure Laterality Date  . Arteriovenous graft placement    . Small intestine surgery    . Small intestine surgery    . Resection of arteriovenous fistula aneurysm Left 02/05/2013    Procedure: REPAIR OF ANEURYSM OF LEFT ARM ARTERIOVENOUS FISTULA ;  Surgeon: Serafina Mitchell, MD;  Location: [facility] OR;  Service: Vascular;  Laterality: Left;  . Resection of arteriovenous fistula aneurysm Left 03/08/2015    Procedure: REPAIR OF LEFT ARTERIOVENOUS FISTULA PSEUDOANEURYSM;  Surgeon: Angelia Mould, MD;  Location: [facility] OR;  Service: Vascular;   Laterality: Left;    Family History  Problem Relation Age of Onset  . Hypertension Mother   . Other Mother     varicose veins  . Diabetes Father   . Hypertension Father   . Hypertension Sister   . Other Sister     varicose veins  . Other Brother     varicose veins    Social History:  reports that he has never smoked. He has never used smokeless tobacco. He reports that he does not drink alcohol or use illicit drugs.  Allergies: No Known Allergies  Medications: I have reviewed the patient's current medications.  Results for orders placed or performed during the hospital encounter of 05/01/15 (from the past 48 hour(s))  Basic metabolic panel     Status: Abnormal   Collection Time: 05/01/15  7:07 PM  Result Value Ref Range   Sodium 140 135 - 145 mmol/L   Potassium 5.0 3.5 - 5.1 mmol/L   Chloride 100 (L) 101 - 111 mmol/L   CO2 27 22 - 32 mmol/L   Glucose, Bld 86 65 - 99 mg/dL   BUN 44 (H) 6 - 20 mg/dL   Creatinine, Ser 15.93 (H) 0.61 - 1.24 mg/dL   Calcium 9.1 8.9 - 10.3 mg/dL   GFR calc non Af Amer 3 (L) >60 mL/min   GFR calc Af Amer 3 (L) >60 mL/min    Comment: (NOTE) The eGFR has been calculated using the CKD EPI equation. This calculation has not been validated in all clinical situations. eGFR's persistently <60 mL/min signify possible Chronic Kidney  Disease.    Anion gap 13 5 - 15  CBC     Status: Abnormal   Collection Time: 05/01/15  7:07 PM  Result Value Ref Range   WBC 6.4 4.0 - 10.5 K/uL   RBC 3.78 (L) 4.22 - 5.81 MIL/uL   Hemoglobin 11.1 (L) 13.0 - 17.0 g/dL   HCT 34.9 (L) 39.0 - 52.0 %   MCV 92.3 78.0 - 100.0 fL   MCH 29.4 26.0 - 34.0 pg   MCHC 31.8 30.0 - 36.0 g/dL   RDW 14.4 11.5 - 15.5 %   Platelets 125 (L) 150 - 400 K/uL  I-stat troponin, ED     Status: Abnormal   Collection Time: 05/01/15  7:27 PM  Result Value Ref Range   Troponin i, poc 0.13 (HH) 0.00 - 0.08 ng/mL   Comment NOTIFIED PHYSICIAN    Comment 3            Comment: Due to the  release kinetics of cTnI, a negative result within the first hours of the onset of symptoms does not rule out myocardial infarction with certainty. If myocardial infarction is still suspected, repeat the test at appropriate intervals.   Troponin I (q 6hr x 3)     Status: Abnormal   Collection Time: 05/02/15 12:34 AM  Result Value Ref Range   Troponin I 0.15 (H) <0.031 ng/mL    Comment:        PERSISTENTLY INCREASED TROPONIN VALUES IN THE RANGE OF 0.04-0.49 ng/mL CAN BE SEEN IN:       -UNSTABLE ANGINA       -CONGESTIVE HEART FAILURE       -MYOCARDITIS       -CHEST TRAUMA       -ARRYHTHMIAS       -LATE PRESENTING MYOCARDIAL INFARCTION       -COPD   CLINICAL FOLLOW-UP RECOMMENDED.   CBC     Status: Abnormal   Collection Time: 05/02/15 12:34 AM  Result Value Ref Range   WBC 6.4 4.0 - 10.5 K/uL   RBC 3.68 (L) 4.22 - 5.81 MIL/uL   Hemoglobin 10.8 (L) 13.0 - 17.0 g/dL   HCT 34.0 (L) 39.0 - 52.0 %   MCV 92.4 78.0 - 100.0 fL   MCH 29.3 26.0 - 34.0 pg   MCHC 31.8 30.0 - 36.0 g/dL   RDW 14.4 11.5 - 15.5 %   Platelets 114 (L) 150 - 400 K/uL    Comment: SPECIMEN CHECKED FOR CLOTS REPEATED TO VERIFY PLATELET COUNT CONFIRMED BY SMEAR   Creatinine, serum     Status: Abnormal   Collection Time: 05/02/15 12:34 AM  Result Value Ref Range   Creatinine, Ser 16.82 (H) 0.61 - 1.24 mg/dL   GFR calc non Af Amer 3 (L) >60 mL/min   GFR calc Af Amer 3 (L) >60 mL/min    Comment: (NOTE) The eGFR has been calculated using the CKD EPI equation. This calculation has not been validated in all clinical situations. eGFR's persistently <60 mL/min signify possible Chronic Kidney Disease.   MRSA PCR Screening     Status: None   Collection Time: 05/02/15 12:39 AM  Result Value Ref Range   MRSA by PCR NEGATIVE NEGATIVE    Comment:        The GeneXpert MRSA Assay (FDA approved for NASAL specimens only), is one component of a comprehensive MRSA colonization surveillance program. It is not intended  to diagnose MRSA infection nor to guide or monitor treatment for  MRSA infections.   Troponin I (q 6hr x 3)     Status: Abnormal   Collection Time: 05/02/15  6:08 AM  Result Value Ref Range   Troponin I 0.13 (H) <0.031 ng/mL    Comment:        PERSISTENTLY INCREASED TROPONIN VALUES IN THE RANGE OF 0.04-0.49 ng/mL CAN BE SEEN IN:       -UNSTABLE ANGINA       -CONGESTIVE HEART FAILURE       -MYOCARDITIS       -CHEST TRAUMA       -ARRYHTHMIAS       -LATE PRESENTING MYOCARDIAL INFARCTION       -COPD   CLINICAL FOLLOW-UP RECOMMENDED.   Comprehensive metabolic panel     Status: Abnormal   Collection Time: 05/02/15  6:08 AM  Result Value Ref Range   Sodium 141 135 - 145 mmol/L   Potassium 4.9 3.5 - 5.1 mmol/L   Chloride 101 101 - 111 mmol/L   CO2 28 22 - 32 mmol/L   Glucose, Bld 78 65 - 99 mg/dL   BUN 52 (H) 6 - 20 mg/dL   Creatinine, Ser 17.50 (H) 0.61 - 1.24 mg/dL   Calcium 8.7 (L) 8.9 - 10.3 mg/dL   Total Protein 6.4 (L) 6.5 - 8.1 g/dL   Albumin 3.3 (L) 3.5 - 5.0 g/dL   AST 17 15 - 41 U/L   ALT 14 (L) 17 - 63 U/L   Alkaline Phosphatase 38 38 - 126 U/L   Total Bilirubin 0.9 0.3 - 1.2 mg/dL   GFR calc non Af Amer 3 (L) >60 mL/min   GFR calc Af Amer 3 (L) >60 mL/min    Comment: (NOTE) The eGFR has been calculated using the CKD EPI equation. This calculation has not been validated in all clinical situations. eGFR's persistently <60 mL/min signify possible Chronic Kidney Disease.    Anion gap 12 5 - 15  CBC WITH DIFFERENTIAL     Status: Abnormal   Collection Time: 05/02/15  6:08 AM  Result Value Ref Range   WBC 6.7 4.0 - 10.5 K/uL   RBC 3.59 (L) 4.22 - 5.81 MIL/uL   Hemoglobin 10.6 (L) 13.0 - 17.0 g/dL   HCT 33.2 (L) 39.0 - 52.0 %   MCV 92.5 78.0 - 100.0 fL   MCH 29.5 26.0 - 34.0 pg   MCHC 31.9 30.0 - 36.0 g/dL   RDW 14.4 11.5 - 15.5 %   Platelets 118 (L) 150 - 400 K/uL    Comment: CONSISTENT WITH PREVIOUS RESULT   Neutrophils Relative % 67 %   Neutro Abs 4.5 1.7 -  7.7 K/uL   Lymphocytes Relative 24 %   Lymphs Abs 1.6 0.7 - 4.0 K/uL   Monocytes Relative 6 %   Monocytes Absolute 0.4 0.1 - 1.0 K/uL   Eosinophils Relative 3 %   Eosinophils Absolute 0.2 0.0 - 0.7 K/uL   Basophils Relative 0 %   Basophils Absolute 0.0 0.0 - 0.1 K/uL  Troponin I (q 6hr x 3)     Status: Abnormal   Collection Time: 05/02/15 12:00 PM  Result Value Ref Range   Troponin I 0.11 (H) <0.031 ng/mL    Comment:        PERSISTENTLY INCREASED TROPONIN VALUES IN THE RANGE OF 0.04-0.49 ng/mL CAN BE SEEN IN:       -UNSTABLE ANGINA       -CONGESTIVE HEART FAILURE       -MYOCARDITIS       -  CHEST TRAUMA       -ARRYHTHMIAS       -LATE PRESENTING MYOCARDIAL INFARCTION       -COPD   CLINICAL FOLLOW-UP RECOMMENDED.     Dg Chest 2 View  05/01/2015   CLINICAL DATA:  Shortness of breath, all worsening.  EXAM: CHEST  2 VIEW  COMPARISON:  05/29/2013  FINDINGS: There is bilateral diffuse interstitial thickening. There is no pleural effusion or pneumothorax. There is stable cardiomegaly.  The osseous structures are unremarkable.  IMPRESSION: Findings most concerning for mild pulmonary edema.   Electronically Signed   By: Kathreen Devoid   On: 05/01/2015 20:04   Ct Angio Chest Aorta W/cm &/or Wo/cm  05/01/2015   CLINICAL DATA:  Acute onset of generalized chest pain. Concern for thoracic aortic dissection. Initial encounter.  EXAM: CT ANGIOGRAPHY CHEST WITH CONTRAST  TECHNIQUE: Multidetector CT imaging of the chest was performed using the standard protocol during bolus administration of intravenous contrast. Multiplanar CT image reconstructions and MIPs were obtained to evaluate the vascular anatomy.  CONTRAST:  169m OMNIPAQUE IOHEXOL 350 MG/ML SOLN  COMPARISON:  Chest radiograph performed earlier today at 7:21 p.m.  FINDINGS: There is no evidence of aortic dissection. There is no evidence of aneurysmal dilatation. Minimal calcification is noted along the aortic arch. No mural thrombus is seen. There  is no evidence of luminal narrowing.  There is no evidence of central pulmonary embolus.  Trace bilateral pleural effusions are noted. Hazy diffuse bilateral airspace opacification is seen, with underlying interstitial prominence, compatible with pulmonary edema. There is no evidence of pneumothorax. No masses are identified; no abnormal focal contrast enhancement is seen.  The heart is enlarged. No significant pericardial effusion is seen. No mediastinal lymphadenopathy is appreciated. Mild calcification is noted at the mitral and aortic valves. Scattered coronary artery calcifications are seen. The great vessels are grossly unremarkable in appearance. No axillary lymphadenopathy is seen. The visualized portions of the thyroid gland are unremarkable in appearance.  The visualized portions of the liver and spleen are unremarkable. The kidneys are diffusely atrophic, with numerous cysts of varying size, reflecting underlying chronic renal disease. The visualized portions of the gallbladder, pancreas and adrenal glands are grossly unremarkable.  No acute osseous abnormalities are seen.  Review of the MIP images confirms the above findings.  IMPRESSION: 1. No evidence of aortic dissection. No evidence of aneurysmal dilatation. No luminal narrowing seen. Minimal calcification noted along the aortic arch. 2. No evidence of central pulmonary embolus. 3. Hazy diffuse bilateral airspace opacification, with underlying interstitial prominence and trace bilateral pleural effusions, compatible with pulmonary edema. 4. Cardiomegaly noted. 5. Scattered coronary artery calcifications seen. Mild calcification at the mitral and aortic valves. 6. Diffuse renal atrophy and scattered renal cysts noted, reflecting chronic renal disease.   Electronically Signed   By: JGarald BaldingM.D.   On: 05/01/2015 23:58    Review of Systems  Constitutional: Positive for malaise/fatigue. Negative for fever, chills and diaphoresis.  HENT:  Negative for hearing loss.   Eyes: Negative for double vision and photophobia.  Respiratory: Positive for shortness of breath. Negative for cough and hemoptysis.   Cardiovascular: Positive for chest pain. Negative for palpitations, orthopnea and claudication.  Gastrointestinal: Negative for nausea, vomiting and abdominal pain.  Genitourinary: Negative for dysuria.  Neurological: Positive for weakness. Negative for headaches.   Blood pressure 176/87, pulse 78, temperature 97.9 F (36.6 C), temperature source Oral, resp. rate 16, height 6' (1.829 m), weight 105.2 kg (231 lb  14.8 oz), SpO2 97 %. Physical Exam  Constitutional: He is oriented to person, place, and time.  HENT:  Head: Normocephalic and atraumatic.  Eyes: Conjunctivae are normal. Pupils are equal, round, and reactive to light.  Neck: Normal range of motion. Neck supple. No JVD present. No tracheal deviation present. No thyromegaly present.  Cardiovascular: Normal rate.   Murmur (soft systolic murmur and S3 gallop noted.  No pericardial rub) heard. Respiratory:  Decreased breath sounds at bases with faint rales  GI: Soft. Bowel sounds are normal. He exhibits no distension. There is no tenderness. There is no rebound.  Musculoskeletal: He exhibits no tenderness.  Neurological: He is alert and oriented to person, place, and time.    Assessment/Plan: Atypical chest pain with minimally elevated troponin.  I doubt significant MI. Acute pulmonary edema secondary to volume overload.rule out ischemia Coronary artery disease, history of MI in the past. Hypertension. End-stage renal disease on hemodialysis. Anemia of chronic disease. Hyperlipidemia. GERD. Status post hypertensive urgency Plan Agree with present management. Discussed with patient regarding invasive left Cardiac catheterization, possible PTCA stenting versus noninvasive nuclear stress testing its risk and benefits, patient wants to proceed with noninvasive stress  testing first. Check 2-D echo Charolette Forward 05/02/2015, 3:14 PM

## 2015-05-02 NOTE — Consult Note (Signed)
Indication for Consultation:  Management of ESRD/hemodialysis; anemia, hypertension/volume and secondary hyperparathyroidism  HPI: Jon [name] is a 59 y.o. male who presented to the ED last night with complaints of SOB since Friday. He receives HD MWF @ [facility], last HD Friday, he has not missed any HD. He reports intermittent SOB and orthopnea, improving with HD but then resumed Saturday night. Denies fever and chills, does have dry cough. He had chest pain after coughing a lot and with deep breaths- no evidence of PE. Cardiology to evaluate.  EDW was lowered last week and TEE was ordered. He reports he is eating well and doesn't think he is losing wt, he is cramping end of treatment. Will arrange HD today and tomorrow for volume  Past Medical History  Diagnosis Date  . Hypertension   . Dialysis patient ([facility])   . Chronic kidney disease   . GERD (gastroesophageal reflux disease)   . Myocardial infarction [facility])     lived in [place] maybe 7 years ago  . History of blood transfusion    Past Surgical History  Procedure Laterality Date  . Arteriovenous graft placement    . Small intestine surgery    . Small intestine surgery    . Resection of arteriovenous fistula aneurysm Left 02/05/2013    Procedure: REPAIR OF ANEURYSM OF LEFT ARM ARTERIOVENOUS FISTULA ;  Surgeon: Serafina Mitchell, MD;  Location: [facility] OR;  Service: Vascular;  Laterality: Left;  . Resection of arteriovenous fistula aneurysm Left 03/08/2015    Procedure: REPAIR OF LEFT ARTERIOVENOUS FISTULA PSEUDOANEURYSM;  Surgeon: Angelia Mould, MD;  Location: [facility] OR;  Service: Vascular;  Laterality: Left;   Family History  Problem Relation Age of Onset  . Hypertension Mother   . Other Mother     varicose veins  . Diabetes Father   . Hypertension Father   . Hypertension Sister   . Other Sister     varicose veins  . Other Brother     varicose veins   Social History:  reports that he has never smoked. He has never used smokeless  tobacco. He reports that he does not drink alcohol or use illicit drugs. No Known Allergies Prior to Admission medications   Medication Sig Start Date End Date Taking? Authorizing Provider  amLODipine (NORVASC) 10 MG tablet Take 10 mg by mouth at bedtime.    Historical Provider, MD  aspirin EC 81 MG EC tablet Take 1 tablet (81 mg total) by mouth daily. 05/30/13   Charolette Forward, MD  atorvastatin (LIPITOR) 20 MG tablet Take 1 tablet (20 mg total) by mouth daily at 6 PM. 05/30/13   Charolette Forward, MD  labetalol (NORMODYNE) 200 MG tablet Take 200 mg by mouth at bedtime.     Historical Provider, MD  losartan (COZAAR) 100 MG tablet Take 100 mg by mouth at bedtime.    Historical Provider, MD  multivitamin (RENA-VIT) TABS tablet Take 1 tablet by mouth at bedtime.    Historical Provider, MD  nitroGLYCERIN (NITROSTAT) 0.4 MG SL tablet Place 1 tablet (0.4 mg total) under the tongue every 5 (five) minutes x 3 doses as needed for chest pain. 05/30/13   Charolette Forward, MD  oxyCODONE (ROXICODONE) 5 MG immediate release tablet Take 1 tablet (5 mg total) by mouth every 6 (six) hours as needed. Patient taking differently: Take 5 mg by mouth every 6 (six) hours as needed for moderate pain.  03/08/15   Gabriel Earing, PA-C   Current Facility-Administered Medications  Medication Dose Route Frequency Provider Last Rate Last Dose  . acetaminophen (TYLENOL) tablet 650 mg  650 mg Oral Q6H PRN Rise Patience, MD       Or  . acetaminophen (TYLENOL) suppository 650 mg  650 mg Rectal Q6H PRN Rise Patience, MD      . amLODipine (NORVASC) tablet 10 mg  10 mg Oral QHS Rise Patience, MD   10 mg at 05/02/15 0120  . aspirin EC tablet 325 mg  325 mg Oral Daily Rise Patience, MD   325 mg at 05/02/15 0956  . atorvastatin (LIPITOR) tablet 20 mg  20 mg Oral q1800 Rise Patience, MD      . enoxaparin (LOVENOX) injection 30 mg  30 mg Subcutaneous Daily Rise Patience, MD   30 mg at 05/02/15 1000  .  labetalol (NORMODYNE) tablet 200 mg  200 mg Oral QHS Rise Patience, MD   200 mg at 05/02/15 0119  . labetalol (NORMODYNE,TRANDATE) injection 10 mg  10 mg Intravenous Q2H PRN Rise Patience, MD   10 mg at 05/02/15 0847  . losartan (COZAAR) tablet 100 mg  100 mg Oral QHS Rise Patience, MD   100 mg at 05/02/15 0120  . morphine 2 MG/ML injection 1 mg  1 mg Intravenous Q3H PRN Rise Patience, MD      . multivitamin (RENA-VIT) tablet 1 tablet  1 tablet Oral QHS Rise Patience, MD   1 tablet at 05/02/15 0120  . nitroGLYCERIN (NITROSTAT) SL tablet 0.4 mg  0.4 mg Sublingual Q5 min PRN Ivin Booty, MD   0.4 mg at 05/01/15 2254  . ondansetron (ZOFRAN) tablet 4 mg  4 mg Oral Q6H PRN Rise Patience, MD       Or  . ondansetron [facility]) injection 4 mg  4 mg Intravenous Q6H PRN Rise Patience, MD      . oxyCODONE (Oxy IR/ROXICODONE) immediate release tablet 5 mg  5 mg Oral Q6H PRN Rise Patience, MD      . sodium chloride 0.9 % injection 3 mL  3 mL Intravenous Q12H Rise Patience, MD   3 mL at 05/02/15 0957   Labs: Basic Metabolic Panel:  Recent Labs Lab 05/01/15 1907 05/02/15 0034 05/02/15 0608  NA 140  --  141  K 5.0  --  4.9  CL 100*  --  101  CO2 27  --  28  GLUCOSE 86  --  78  BUN 44*  --  52*  CREATININE 15.93* 16.82* 17.50*  CALCIUM 9.1  --  8.7*   Liver Function Tests:  Recent Labs Lab 05/02/15 0608  AST 17  ALT 14*  ALKPHOS 38  BILITOT 0.9  PROT 6.4*  ALBUMIN 3.3*   No results for input(s): LIPASE, AMYLASE in the last 168 hours. No results for input(s): AMMONIA in the last 168 hours. CBC:  Recent Labs Lab 05/01/15 1907 05/02/15 0034 05/02/15 0608  WBC 6.4 6.4 6.7  NEUTROABS  --   --  4.5  HGB 11.1* 10.8* 10.6*  HCT 34.9* 34.0* 33.2*  MCV 92.3 92.4 92.5  PLT 125* 114* 118*   Cardiac Enzymes:  Recent Labs Lab 05/02/15 0034 05/02/15 0608  TROPONINI 0.15* 0.13*   CBG: No results for input(s): GLUCAP in the last 168  hours. Iron Studies: No results for input(s): IRON, TIBC, TRANSFERRIN, FERRITIN in the last 72 hours. Studies/Results: Dg Chest 2 View  05/01/2015   CLINICAL  DATA:  Shortness of breath, all worsening.  EXAM: CHEST  2 VIEW  COMPARISON:  05/29/2013  FINDINGS: There is bilateral diffuse interstitial thickening. There is no pleural effusion or pneumothorax. There is stable cardiomegaly.  The osseous structures are unremarkable.  IMPRESSION: Findings most concerning for mild pulmonary edema.   Electronically Signed   By: Kathreen Devoid   On: 05/01/2015 20:04   Ct Angio Chest Aorta W/cm &/or Wo/cm  05/01/2015   CLINICAL DATA:  Acute onset of generalized chest pain. Concern for thoracic aortic dissection. Initial encounter.  EXAM: CT ANGIOGRAPHY CHEST WITH CONTRAST  TECHNIQUE: Multidetector CT imaging of the chest was performed using the standard protocol during bolus administration of intravenous contrast. Multiplanar CT image reconstructions and MIPs were obtained to evaluate the vascular anatomy.  CONTRAST:  14mL OMNIPAQUE IOHEXOL 350 MG/ML SOLN  COMPARISON:  Chest radiograph performed earlier today at 7:21 p.m.  FINDINGS: There is no evidence of aortic dissection. There is no evidence of aneurysmal dilatation. Minimal calcification is noted along the aortic arch. No mural thrombus is seen. There is no evidence of luminal narrowing.  There is no evidence of central pulmonary embolus.  Trace bilateral pleural effusions are noted. Hazy diffuse bilateral airspace opacification is seen, with underlying interstitial prominence, compatible with pulmonary edema. There is no evidence of pneumothorax. No masses are identified; no abnormal focal contrast enhancement is seen.  The heart is enlarged. No significant pericardial effusion is seen. No mediastinal lymphadenopathy is appreciated. Mild calcification is noted at the mitral and aortic valves. Scattered coronary artery calcifications are seen. The great vessels are  grossly unremarkable in appearance. No axillary lymphadenopathy is seen. The visualized portions of the thyroid gland are unremarkable in appearance.  The visualized portions of the liver and spleen are unremarkable. The kidneys are diffusely atrophic, with numerous cysts of varying size, reflecting underlying chronic renal disease. The visualized portions of the gallbladder, pancreas and adrenal glands are grossly unremarkable.  No acute osseous abnormalities are seen.  Review of the MIP images confirms the above findings.  IMPRESSION: 1. No evidence of aortic dissection. No evidence of aneurysmal dilatation. No luminal narrowing seen. Minimal calcification noted along the aortic arch. 2. No evidence of central pulmonary embolus. 3. Hazy diffuse bilateral airspace opacification, with underlying interstitial prominence and trace bilateral pleural effusions, compatible with pulmonary edema. 4. Cardiomegaly noted. 5. Scattered coronary artery calcifications seen. Mild calcification at the mitral and aortic valves. 6. Diffuse renal atrophy and scattered renal cysts noted, reflecting chronic renal disease.   Electronically Signed   By: Garald Balding M.D.   On: 05/01/2015 23:58    Review of Systems: Gen: Denies any fever, chills, sweats, anorexia, fatigue, weakness, malaise, weight loss, and sleep disorder  CV: Reports chest pain last night, now resolved. Reports orthopnea, PND. Denies edema Resp: Reports dyspnea at rest, dyspnea with exercise, cough. Denies sputum, wheezing, coughing up blood, and pleurisy. GI: Denies vomiting blood, jaundice, and fecal incontinence.   Denies dysphagia or odynophagia. GU : Denies urinary burning, blood in urine, urinary frequency, urinary hesitancy, nocturnal urination, and urinary incontinence.  No renal calculi. MS: Denies joint pain, limitation of movement, and swelling, stiffness, low back pain, extremity pain. Denies muscle weakness, cramps, atrophy.  No use of non  steroidal antiinflammatory drugs. Derm: Denies rash, itching, dry skin, hives, moles, warts, or unhealing ulcers.  Psych: Denies depression, anxiety, memory loss, suicidal ideation, hallucinations, paranoia, and confusion. Heme: Denies bruising, bleeding, and enlarged lymph nodes. Neuro: No  headache.  No diplopia. No dysarthria.  No dysphasia.  No history of CVA.  No Seizures. No paresthesias.  No weakness. Endocrine No DM.  No Thyroid disease.  No Adrenal disease.  Physical Exam: Filed Vitals:   05/02/15 0355 05/02/15 0400 05/02/15 0600 05/02/15 0800  BP:  166/82 164/95 188/93  Pulse:  73 74 80  Temp: 98.6 F (37 C)   97.9 F (36.6 C)  TempSrc: Oral   Oral  Resp:  16 17 17   Height:      Weight:      SpO2:  96% 96% 97%     General: Well developed, well nourished, in no acute distress. Head: Normocephalic, atraumatic, sclera non-icteric, mucus membranes are moist Neck: Supple. JVD not elevated. Lungs: Faint bibasilar rales. Breathing is unlabored. Heart: RRR with S1 S2. No murmurs, rubs, or gallops appreciated. Abdomen: Soft, non-tender, non-distended with normoactive bowel sounds. No rebound/guarding. No obvious abdominal masses. M-S:  Strength and tone appear normal for age. Lower extremities:without edema or ischemic changes, no open wounds  Neuro: Alert and oriented X 3. Moves all extremities spontaneously. Psych:  Responds to questions appropriately with a normal affect. Dialysis Access:  L AVf +b/t  Dialysis Orders:  [facility]  4 hr 15 mins    101.5kgs   2K/2Ca+   Heparin 3500 Venofer 100 x10- stop date 10/24 Calcitriol 2.25  Assessment/Plan: 1.  SOB- reports improved after HD Friday. No evidence of PE.  Pulm edema on xray. HD today and tomorrow for volume. TEE had been ordered outpt- not done 2.  ESRD -  MWF [facility], HD pending today. Complaint with treatment  3.  Hypertension/volume  - edw lowered 1 kgs last week- cont to lower edw as tolerated. Home meds amlodpine,  carvedilol, losartan 4.  Anemia  - hgb 10.6. No ESA, cont Fe bolus. Last tsat 27 5.  Metabolic bone disease -  Cont binder and calcitriol. Last pth 173 and pthos 3.7 6.  Nutrition - renal diet. vitamin  Shelle Iron, NP [employer] [phone] 05/02/2015, 10:33 AM   Pt seen, examined and agree w A/P as above.  Kelly Splinter MD pager [phone]    cell [phone] 05/02/2015, 1:25 PM

## 2015-05-02 NOTE — Progress Notes (Signed)
Pt had a 5 run V-tach at 1513pm and a 3 run v-tach around 1645pm; Shelle Iron made aware while on the unit; she ordered to stop the tx with 10 mins; she also ordered an EKG which was performed on the unit. Pt c/o of chest pain nitrox3 administered.

## 2015-05-02 NOTE — Procedures (Signed)
  I was present at this dialysis session, have reviewed the session itself and made  appropriate changes Kelly Splinter MD (pgr) [phone]    [phone] 05/02/2015, 1:30 PM

## 2015-05-02 NOTE — Progress Notes (Signed)
Patient Demographics  Jon [name], is a 59 y.o. male, DOB - 04-25-1956, ZOX:[id number]  Admit date - 05/01/2015   Admitting Physician Eduard Clos, MD  Outpatient Primary MD for the patient is [name], MD  LOS - 1   Chief Complaint  Patient presents with  . Shortness of Breath        Subjective:   Jon [name] today has, No headache, No chest pain, No abdominal pain - No Nausea, No new weakness tingling or numbness, No Cough - denies any further chest pain, dyspnea improving.  Assessment & Plan    Principal Problem:   Acute respiratory failure with hypoxia (HCC) Active Problems:   End stage renal disease (HCC)   Pulmonary edema   Hypertensive urgency   Chronic anemia  Acute respiratory failure and hypoxia  - probably secondary to fluid overload in a patient with ESRD on hemodialysis. Presently not in distress.  Nephrology consult for HD  ESRD - Nephrology consulted to resume hemodialysis, hemodialysis Monday Wednesday Friday  Chest pain - Currently resolved, CT chest with IV contrast with no evidence of dissection or PE - Was likely related to hypertensive urgency and volume overload. - Patient troponins are slightly elevated, but this is most likely related to his end-stage renal disease, negative stress test in 2014, coronary artery calcification noticed on CTA chest,requested Dr Sharyn Lull to  to evaluate.  Anemia - Secondary to chronic renal disease, monitor CBC closely  Hypertensive urgency - Continue with home medication, continue with when necessary hydralazine, anticipate pressure would improve with dialysis  Code Status: Full  Family Communication: None at bedside  Disposition Plan: Home when stable   Procedures  None   Consults   Nephrology Cardiology   Medications  Scheduled Meds: . amLODipine  10 mg Oral QHS  . aspirin EC  325 mg Oral  Daily  . atorvastatin  20 mg Oral q1800  . enoxaparin (LOVENOX) injection  30 mg Subcutaneous Daily  . labetalol  200 mg Oral QHS  . losartan  100 mg Oral QHS  . multivitamin  1 tablet Oral QHS  . sodium chloride  3 mL Intravenous Q12H   Continuous Infusions:  PRN Meds:.acetaminophen **OR** acetaminophen, labetalol, morphine injection, nitroGLYCERIN, ondansetron **OR** ondansetron (ZOFRAN) IV, oxyCODONE  DVT Prophylaxis  Lovenox -   Lab Results  Component Value Date   PLT 118* 05/02/2015    Antibiotics    Anti-infectives    None          Objective:   Filed Vitals:   05/02/15 0355 05/02/15 0400 05/02/15 0600 05/02/15 0800  BP:  166/82 164/95 188/93  Pulse:  73 74 80  Temp: 98.6 F (37 C)   97.9 F (36.6 C)  TempSrc: Oral   Oral  Resp:  16 17 17   Height:      Weight:      SpO2:  96% 96% 97%    Wt Readings from Last 3 Encounters:  05/02/15 103.1 kg (227 lb 4.7 oz)  04/20/15 102.513 kg (226 lb)  03/08/15 102.967 kg (227 lb)    No intake or output data in the 24 hours ending 05/02/15 1003   Physical Exam  Awake Alert, Oriented X 3, No new F.N deficits,  Normal affect [place].AT,PERRAL Supple Neck,No JVD, No cervical lymphadenopathy appriciated.  Symmetrical Chest wall movement, Good air movement bilaterally, CTAB RRR,No Gallops,Rubs or new Murmurs, No Parasternal Heave +ve B.Sounds, Abd Soft, No tenderness, No organomegaly appriciated, No rebound - guarding or rigidity. No Cyanosis, Clubbing or edema, No new Rash or bruise     Data Review   Micro Results Recent Results (from the past 240 hour(s))  MRSA PCR Screening     Status: None   Collection Time: 05/02/15 12:39 AM  Result Value Ref Range Status   MRSA by PCR NEGATIVE NEGATIVE Final    Comment:        The GeneXpert MRSA Assay (FDA approved for NASAL specimens only), is one component of a comprehensive MRSA colonization surveillance program. It is not intended to diagnose MRSA infection nor to  guide or monitor treatment for MRSA infections.     Radiology Reports Dg Chest 2 View  05/01/2015   CLINICAL DATA:  Shortness of breath, all worsening.  EXAM: CHEST  2 VIEW  COMPARISON:  05/29/2013  FINDINGS: There is bilateral diffuse interstitial thickening. There is no pleural effusion or pneumothorax. There is stable cardiomegaly.  The osseous structures are unremarkable.  IMPRESSION: Findings most concerning for mild pulmonary edema.   Electronically Signed   By: Elige Ko   On: 05/01/2015 20:04   Ct Angio Chest Aorta W/cm &/or Wo/cm  05/01/2015   CLINICAL DATA:  Acute onset of generalized chest pain. Concern for thoracic aortic dissection. Initial encounter.  EXAM: CT ANGIOGRAPHY CHEST WITH CONTRAST  TECHNIQUE: Multidetector CT imaging of the chest was performed using the standard protocol during bolus administration of intravenous contrast. Multiplanar CT image reconstructions and MIPs were obtained to evaluate the vascular anatomy.  CONTRAST:  OMNIPAQUE IOHEXOL 350 MG/ML SOLN  COMPARISON:  Chest radiograph performed earlier today at 7:21 p.m.  FINDINGS: There is no evidence of aortic dissection. There is no evidence of aneurysmal dilatation. Minimal calcification is noted along the aortic arch. No mural thrombus is seen. There is no evidence of luminal narrowing.  There is no evidence of central pulmonary embolus.  Trace bilateral pleural effusions are noted. Hazy diffuse bilateral airspace opacification is seen, with underlying interstitial prominence, compatible with pulmonary edema. There is no evidence of pneumothorax. No masses are identified; no abnormal focal contrast enhancement is seen.  The heart is enlarged. No significant pericardial effusion is seen. No mediastinal lymphadenopathy is appreciated. Mild calcification is noted at the mitral and aortic valves. Scattered coronary artery calcifications are seen. The great vessels are grossly unremarkable in appearance. No axillary  lymphadenopathy is seen. The visualized portions of the thyroid gland are unremarkable in appearance.  The visualized portions of the liver and spleen are unremarkable. The kidneys are diffusely atrophic, with numerous cysts of varying size, reflecting underlying chronic renal disease. The visualized portions of the gallbladder, pancreas and adrenal glands are grossly unremarkable.  No acute osseous abnormalities are seen.  Review of the MIP images confirms the above findings.  IMPRESSION: 1. No evidence of aortic dissection. No evidence of aneurysmal dilatation. No luminal narrowing seen. Minimal calcification noted along the aortic arch. 2. No evidence of central pulmonary embolus. 3. Hazy diffuse bilateral airspace opacification, with underlying interstitial prominence and trace bilateral pleural effusions, compatible with pulmonary edema. 4. Cardiomegaly noted. 5. Scattered coronary artery calcifications seen. Mild calcification at the mitral and aortic valves. 6. Diffuse renal atrophy and scattered renal cysts noted, reflecting chronic renal disease.  Electronically Signed   By: Roanna Raider M.D.   On: 05/01/2015 23:58     CBC  Recent Labs Lab 05/01/15 1907 05/02/15 0034 05/02/15 0608  WBC 6.4 6.4 6.7  HGB 11.1* 10.8* 10.6*  HCT 34.9* 34.0* 33.2*  PLT 125* 114* 118*  MCV 92.3 92.4 92.5  MCH 29.4 29.3 29.5  MCHC 31.8 31.8 31.9  RDW 14.4 14.4 14.4  LYMPHSABS  --   --  1.6  MONOABS  --   --  0.4  EOSABS  --   --  0.2  BASOSABS  --   --  0.0    Chemistries   Recent Labs Lab 05/01/15 1907 05/02/15 0034 05/02/15 0608  NA 140  --  141  K 5.0  --  4.9  CL 100*  --  101  CO2 27  --  28  GLUCOSE 86  --  78  BUN 44*  --  52*  CREATININE 15.93* 16.82* 17.50*  CALCIUM 9.1  --  8.7*  AST  --   --  17  ALT  --   --  14*  ALKPHOS  --   --  38  BILITOT  --   --  0.9    ------------------------------------------------------------------------------------------------------------------ estimated creatinine clearance is 5.9 mL/min (by C-G formula based on Cr of 17.5). ------------------------------------------------------------------------------------------------------------------ No results for input(s): HGBA1C in the last 72 hours. ------------------------------------------------------------------------------------------------------------------ No results for input(s): CHOL, HDL, LDLCALC, TRIG, CHOLHDL, LDLDIRECT in the last 72 hours. ------------------------------------------------------------------------------------------------------------------ No results for input(s): TSH, T4TOTAL, T3FREE, THYROIDAB in the last 72 hours.  Invalid input(s): FREET3 ------------------------------------------------------------------------------------------------------------------ No results for input(s): VITAMINB12, FOLATE, FERRITIN, TIBC, IRON, RETICCTPCT in the last 72 hours.  Coagulation profile No results for input(s): INR, PROTIME in the last 168 hours.  No results for input(s): DDIMER in the last 72 hours.  Cardiac Enzymes  Recent Labs Lab 05/02/15 0034 05/02/15 6962  TROPONINI 0.15* 0.13*   ------------------------------------------------------------------------------------------------------------------ Invalid input(s): POCBNP     Time Spent in minutes      Derika Eckles M.D on 05/02/2015 at 10:03 AM  Between 7am to 7pm - Pager - [phone]  After 7pm go to [email or web address] - password [facility]  [facility]   Office  [phone]

## 2015-05-03 ENCOUNTER — Encounter (HOSPITAL_COMMUNITY): Payer: Medicare Other

## 2015-05-03 ENCOUNTER — Ambulatory Visit (HOSPITAL_COMMUNITY): Payer: Medicare Other

## 2015-05-03 ENCOUNTER — Inpatient Hospital Stay (HOSPITAL_COMMUNITY): Payer: Medicare Other

## 2015-05-03 DIAGNOSIS — R079 Chest pain, unspecified: Secondary | ICD-10-CM

## 2015-05-03 LAB — BASIC METABOLIC PANEL
ANION GAP: 12 (ref 5–15)
BUN: 35 mg/dL — AB (ref 6–20)
CALCIUM: 8.4 mg/dL — AB (ref 8.9–10.3)
CO2: 26 mmol/L (ref 22–32)
CREATININE: 13.62 mg/dL — AB (ref 0.61–1.24)
Chloride: 101 mmol/L (ref 101–111)
GFR calc Af Amer: 4 mL/min — ABNORMAL LOW (ref >60)
GFR, EST NON AFRICAN AMERICAN: 3 mL/min — AB (ref >60)
GLUCOSE: 85 mg/dL (ref 65–99)
Potassium: 4.5 mmol/L (ref 3.5–5.1)
Sodium: 139 mmol/L (ref 135–145)

## 2015-05-03 MED ORDER — REGADENOSON 0.4 MG/5ML IV SOLN
0.4000 mg | Freq: Once | INTRAVENOUS | Status: AC
  Administered 2015-05-03: 0.4 mg via INTRAVENOUS
  Filled 2015-05-03: qty 5

## 2015-05-03 MED ORDER — REGADENOSON 0.4 MG/5ML IV SOLN
INTRAVENOUS | Status: AC
  Administered 2015-05-03: 0.4 mg via INTRAVENOUS
  Filled 2015-05-03: qty 5

## 2015-05-03 MED ORDER — TECHNETIUM TC 99M SESTAMIBI GENERIC - CARDIOLITE
10.0000 | Freq: Once | INTRAVENOUS | Status: AC | PRN
  Administered 2015-05-03: 10 via INTRAVENOUS

## 2015-05-03 MED ORDER — TECHNETIUM TC 99M SESTAMIBI GENERIC - CARDIOLITE
30.0000 | Freq: Once | INTRAVENOUS | Status: AC | PRN
  Administered 2015-05-03: 30 via INTRAVENOUS

## 2015-05-03 NOTE — Progress Notes (Signed)
  Jon [name] Progress Note   Subjective: feels much better, no CP or SOB. Had stress test this am  Filed Vitals:   05/03/15 1125 05/03/15 1126 05/03/15 1247 05/03/15 1300  BP: 175/92   178/91  Pulse: 88 89  73  Temp:   98 F (36.7 C)   TempSrc:   Oral   Resp:    33  Height:      Weight:      SpO2:    97%   Exam: Alert, no distress Chest clear bilat RRR no MRG Abd soft ntnd No LE edema L AVF +bruit  [facility] 4 hr 15 mins 101.5kgs 2K/2Ca+ Heparin 3500 Venofer 100 x10- stop date 10/24 Calcitriol 2.25      Assessment: 1 Dyspnea/ pulm edema - due to vol excess and lean body wt loss. Pt has been losing weight over time he says.  2 ESRD HD mwf 3 HTN as above 4 MBD no change  Plan - extra HD today and is ok for dc thereafter if stable otherwise    Kelly Splinter MD  pager [phone]    cell [phone]  05/03/2015, 1:31 PM     Recent Labs Lab 05/01/15 1907 05/02/15 0034 05/02/15 0608 05/03/15 0925  NA 140  --  141 139  K 5.0  --  4.9 4.5  CL 100*  --  101 101  CO2 27  --  28 26  GLUCOSE 86  --  78 85  BUN 44*  --  52* 35*  CREATININE 15.93* 16.82* 17.50* 13.62*  CALCIUM 9.1  --  8.7* 8.4*    Recent Labs Lab 05/02/15 0608  AST 17  ALT 14*  ALKPHOS 38  BILITOT 0.9  PROT 6.4*  ALBUMIN 3.3*    Recent Labs Lab 05/01/15 1907 05/02/15 0034 05/02/15 0608  WBC 6.4 6.4 6.7  NEUTROABS  --   --  4.5  HGB 11.1* 10.8* 10.6*  HCT 34.9* 34.0* 33.2*  MCV 92.3 92.4 92.5  PLT 125* 114* 118*   . amLODipine  10 mg Oral QHS  . aspirin EC  325 mg Oral Daily  . atorvastatin  20 mg Oral q1800  . calcitRIOL  2.25 mcg Oral Q M,W,F-HD  . enoxaparin (LOVENOX) injection  30 mg Subcutaneous Daily  . ferric gluconate (FERRLECIT/NULECIT) IV  62.5 mg Intravenous Q M,W,F-HD  . labetalol  200 mg Oral QHS  . losartan  100 mg Oral QHS  . multivitamin  1 tablet Oral QHS  . sodium chloride  3 mL Intravenous Q12H     acetaminophen **OR**  acetaminophen, labetalol, morphine injection, nitroGLYCERIN, ondansetron **OR** ondansetron (ZOFRAN) IV, oxyCODONE

## 2015-05-03 NOTE — Progress Notes (Signed)
Called report to [place] at 1500, pt having ECHO at bedside at this time. Will transfer pt after ECHO. Consuelo Pandy RN

## 2015-05-03 NOTE — Care Management Note (Signed)
Case Management Note  Patient Details  Name: Jon [name] MRN: [id number] Date of Birth: 06/28/1956  Subjective/Objective:                Admitted with sob/ CP,history of ESRD on hemodialysis on Monday Wednesday and Friday, hypertension, chronic anemia.   Action/Plan: Return to home when medically stable. CM to f/u with disposition needs.  Expected Discharge Date:                  Expected Discharge Plan:  Home/Self Care  In-House Referral:     Discharge planning Services  CM Consult  Post Acute Care Choice:    Choice offered to:     DME Arranged:    DME Agency:     HH Arranged:    HH Agency:     Status of Service:  In process, will continue to follow  Medicare Important Message Given:    Date Medicare IM Given:    Medicare IM give by:    Date Additional Medicare IM Given:    Additional Medicare Important Message give by:     If discussed at [place] of Stay Meetings, dates discussed:    Additional Comments: Jon [name] (Brother)  [phone]  Jon [name] [place], [place] [phone] 05/03/2015, 4:27 PM

## 2015-05-03 NOTE — Progress Notes (Signed)
Patient Demographics  Jon [name], is a 59 y.o. male, DOB - [birth year], [id number]  Admit date - 05/01/2015   Admitting Physician Jon Clos, MD  Outpatient Primary MD for the patient is Jon [name], MD  LOS - 2   Chief Complaint  Patient presents with  . Shortness of Breath        Subjective:   Jon [name] today has, No headache, No chest pain, No abdominal pain - No Nausea, No new weakness tingling or numbness, No Cough - denies any further chest pain, dyspnea improving.  Assessment & Plan    Principal Problem:   Acute respiratory failure with hypoxia (HCC) Active Problems:   End stage renal disease (HCC)   Pulmonary edema   Hypertensive urgency   Chronic anemia  Acute respiratory failure and hypoxia  - probably secondary to fluid overload in a patient with ESRD on hemodialysis. Presently not in distress.  - Continue with oxygen as needed - Volume management with hemodialysis, dialyzed yesterday, plan to dialyze today as well   ESRD - Nephrology consulted to resume hemodialysis, hemodialysis Monday Wednesday Friday. - Please see above discussion.  Chest pain - Currently resolved, CT chest with IV contrast with no evidence of dissection or PE - Was likely related to hypertensive urgency and volume overload. - Patient troponins are slightly elevated, but this is most likely related to his end-stage renal disease, negative stress test in 2014, coronary artery calcification noticed on CTA chest. - Cardiology consult appreciated, one is for stress echo today.  Anemia - Secondary to chronic renal disease, monitor CBC closely  Hypertensive urgency - Continue with home medication, continue with when necessary hydralazine, anticipate pressure would improve with further dialysis today.  Code Status: Full  Family Communication: None at bedside  Disposition Plan:  Home when stable.   Procedures  None   Consults   Nephrology Cardiology   Medications  Scheduled Meds: . amLODipine  10 mg Oral QHS  . aspirin EC  325 mg Oral Daily  . atorvastatin  20 mg Oral q1800  . calcitRIOL  2.25 mcg Oral Q M,W,F-HD  . enoxaparin (LOVENOX) injection  30 mg Subcutaneous Daily  . ferric gluconate (FERRLECIT/NULECIT) IV  62.5 mg Intravenous Q M,W,F-HD  . labetalol  200 mg Oral QHS  . losartan  100 mg Oral QHS  . multivitamin  1 tablet Oral QHS  . sodium chloride  3 mL Intravenous Q12H   Continuous Infusions:  PRN Meds:.acetaminophen **OR** acetaminophen, labetalol, morphine injection, nitroGLYCERIN, ondansetron **OR** ondansetron (ZOFRAN) IV, oxyCODONE  DVT Prophylaxis  Lovenox -   Lab Results  Component Value Date   PLT 118* 05/02/2015    Antibiotics    Anti-infectives    None          Objective:   Filed Vitals:   05/03/15 1121 05/03/15 1123 05/03/15 1125 05/03/15 1126  BP:  165/89 175/92   Pulse: 76 90 88 89  Temp:      TempSrc:      Resp:  18    Height:      Weight:      SpO2:        Wt Readings from Last 3 Encounters:  05/03/15 101.3 kg (223 lb 5.2  oz)  04/20/15 102.513 kg (226 lb)  03/08/15 102.967 kg (227 lb)     Intake/Output Summary (Last 24 hours) at 05/03/15 1149 Last data filed at 05/03/15 0900  Gross per 24 hour  Intake    480 ml  Output   3878 ml  Net  -3398 ml     Physical Exam  Awake Alert, Oriented X 3, No new F.N deficits, Normal affect [place].AT,PERRAL Supple Neck,No JVD, No cervical lymphadenopathy appriciated.  Symmetrical Chest wall movement, Good air movement bilaterally, CTAB RRR,No Gallops,Rubs or new Murmurs, No Parasternal Heave +ve B.Sounds, Abd Soft, No tenderness, No organomegaly appriciated, No rebound - guarding or rigidity. No Cyanosis, Clubbing or edema, No new Rash or bruise     Data Review   Micro Results Recent Results (from the past 240 hour(s))  MRSA PCR Screening      Status: None   Collection Time: 05/02/15 12:39 AM  Result Value Ref Range Status   MRSA by PCR NEGATIVE NEGATIVE Final    Comment:        The GeneXpert MRSA Assay (FDA approved for NASAL specimens only), is one component of a comprehensive MRSA colonization surveillance program. It is not intended to diagnose MRSA infection nor to guide or monitor treatment for MRSA infections.     Radiology Reports Dg Chest 2 View  05/01/2015   CLINICAL DATA:  Shortness of breath, all worsening.  EXAM: CHEST  2 VIEW  COMPARISON:  05/29/2013  FINDINGS: There is bilateral diffuse interstitial thickening. There is no pleural effusion or pneumothorax. There is stable cardiomegaly.  The osseous structures are unremarkable.  IMPRESSION: Findings most concerning for mild pulmonary edema.   Electronically Signed   By: Elige Ko   On: 05/01/2015 20:04   Ct Angio Chest Aorta W/cm &/or Wo/cm  05/01/2015   CLINICAL DATA:  Acute onset of generalized chest pain. Concern for thoracic aortic dissection. Initial encounter.  EXAM: CT ANGIOGRAPHY CHEST WITH CONTRAST  TECHNIQUE: Multidetector CT imaging of the chest was performed using the standard protocol during bolus administration of intravenous contrast. Multiplanar CT image reconstructions and MIPs were obtained to evaluate the vascular anatomy.  CONTRAST:  OMNIPAQUE IOHEXOL 350 MG/ML SOLN  COMPARISON:  Chest radiograph performed earlier today at 7:21 p.m.  FINDINGS: There is no evidence of aortic dissection. There is no evidence of aneurysmal dilatation. Minimal calcification is noted along the aortic arch. No mural thrombus is seen. There is no evidence of luminal narrowing.  There is no evidence of central pulmonary embolus.  Trace bilateral pleural effusions are noted. Hazy diffuse bilateral airspace opacification is seen, with underlying interstitial prominence, compatible with pulmonary edema. There is no evidence of pneumothorax. No masses are identified; no  abnormal focal contrast enhancement is seen.  The heart is enlarged. No significant pericardial effusion is seen. No mediastinal lymphadenopathy is appreciated. Mild calcification is noted at the mitral and aortic valves. Scattered coronary artery calcifications are seen. The great vessels are grossly unremarkable in appearance. No axillary lymphadenopathy is seen. The visualized portions of the thyroid gland are unremarkable in appearance.  The visualized portions of the liver and spleen are unremarkable. The kidneys are diffusely atrophic, with numerous cysts of varying size, reflecting underlying chronic renal disease. The visualized portions of the gallbladder, pancreas and adrenal glands are grossly unremarkable.  No acute osseous abnormalities are seen.  Review of the MIP images confirms the above findings.  IMPRESSION: 1. No evidence of aortic dissection. No evidence of aneurysmal  dilatation. No luminal narrowing seen. Minimal calcification noted along the aortic arch. 2. No evidence of central pulmonary embolus. 3. Hazy diffuse bilateral airspace opacification, with underlying interstitial prominence and trace bilateral pleural effusions, compatible with pulmonary edema. 4. Cardiomegaly noted. 5. Scattered coronary artery calcifications seen. Mild calcification at the mitral and aortic valves. 6. Diffuse renal atrophy and scattered renal cysts noted, reflecting chronic renal disease.   Electronically Signed   By: Roanna Raider M.D.   On: 05/01/2015 23:58     CBC  Recent Labs Lab 05/01/15 1907 05/02/15 0034 05/02/15 0608  WBC 6.4 6.4 6.7  HGB 11.1* 10.8* 10.6*  HCT 34.9* 34.0* 33.2*  PLT 125* 114* 118*  MCV 92.3 92.4 92.5  MCH 29.4 29.3 29.5  MCHC 31.8 31.8 31.9  RDW 14.4 14.4 14.4  LYMPHSABS  --   --  1.6  MONOABS  --   --  0.4  EOSABS  --   --  0.2  BASOSABS  --   --  0.0    Chemistries   Recent Labs Lab 05/01/15 1907 05/02/15 0034 05/02/15 0608 05/03/15 0925  NA 140  --   141 139  K 5.0  --  4.9 4.5  CL 100*  --  101 101  CO2 27  --  28 26  GLUCOSE 86  --  78 85  BUN 44*  --  52* 35*  CREATININE 15.93* 16.82* 17.50* 13.62*  CALCIUM 9.1  --  8.7* 8.4*  AST  --   --  17  --   ALT  --   --  14*  --   ALKPHOS  --   --  38  --   BILITOT  --   --  0.9  --    ------------------------------------------------------------------------------------------------------------------ estimated creatinine clearance is 7.5 mL/min (by C-G formula based on Cr of 13.62). ------------------------------------------------------------------------------------------------------------------ No results for input(s): HGBA1C in the last 72 hours. ------------------------------------------------------------------------------------------------------------------ No results for input(s): CHOL, HDL, LDLCALC, TRIG, CHOLHDL, LDLDIRECT in the last 72 hours. ------------------------------------------------------------------------------------------------------------------ No results for input(s): TSH, T4TOTAL, T3FREE, THYROIDAB in the last 72 hours.  Invalid input(s): FREET3 ------------------------------------------------------------------------------------------------------------------ No results for input(s): VITAMINB12, FOLATE, FERRITIN, TIBC, IRON, RETICCTPCT in the last 72 hours.  Coagulation profile No results for input(s): INR, PROTIME in the last 168 hours.  No results for input(s): DDIMER in the last 72 hours.  Cardiac Enzymes  Recent Labs Lab 05/02/15 0034 05/02/15 0608 05/02/15 1200  TROPONINI 0.15* 0.13* 0.11*   ------------------------------------------------------------------------------------------------------------------ Invalid input(s): POCBNP     Time Spent in minutes   35 minutes   Leonore Frankson M.D on 05/03/2015 at 11:49 AM  Between 7am to 7pm - Pager - [phone]  After 7pm go to [email or web address] - password [facility]  [facility]   Office   [phone]

## 2015-05-03 NOTE — Progress Notes (Signed)
  Echocardiogram 2D Echocardiogram has been performed.  Bobbye Charleston 05/03/2015, 3:26 PM

## 2015-05-03 NOTE — Progress Notes (Signed)
Subjective:  Denies any chest pain or shortness of breath.  Had few beats of nonsustained VT yesterday.  Seen in nuclear medicine department, tolerated nuclear stress test okay.  No acute ischemic changes noted during the test.  However, nuclear scan shows a small area of reversible ischemia in the anterolateral wall at the base with EF of 46%.  Objective:  Vital Signs in the last 24 hours: Temp:  [97.8 F (36.6 C)-99 F (37.2 C)] 98 F (36.7 C) (10/04 1247) Pulse Rate:  [73-90] 73 (10/04 1300) Resp:  [16-33] 33 (10/04 1300) BP: (143-191)/(65-100) 178/91 mmHg (10/04 1300) SpO2:  [89 %-99 %] 97 % (10/04 1300) Weight:  [101.3 kg (223 lb 5.2 oz)] 101.3 kg (223 lb 5.2 oz) (10/04 0500)  Intake/Output from previous day: 10/03 0701 - 10/04 0700 In: 480 [P.O.:480] Out: 3878  Intake/Output from this shift: Total I/O In: 240 [P.O.:240] Out: -   Physical Exam: Neck: no adenopathy, no carotid bruit, no JVD and supple, symmetrical, trachea midline Lungs: decreased breath sounds at bases with faint rales Heart: regular rate and rhythm, S1, S2 normal and soft systolic murmur and S3 gallop noted Abdomen: soft, non-tender; bowel sounds normal; no masses,  no organomegaly Extremities: extremities normal, atraumatic, no cyanosis or edema  Lab Results:  Recent Labs  05/02/15 0034 05/02/15 0608  WBC 6.4 6.7  HGB 10.8* 10.6*  PLT 114* 118*    Recent Labs  05/02/15 0608 05/03/15 0925  NA 141 139  K 4.9 4.5  CL 101 101  CO2 28 26  GLUCOSE 78 85  BUN 52* 35*  CREATININE 17.50* 13.62*    Recent Labs  05/02/15 0608 05/02/15 1200  TROPONINI 0.13* 0.11*   Hepatic Function Panel  Recent Labs  05/02/15 0608  PROT 6.4*  ALBUMIN 3.3*  AST 17  ALT 14*  ALKPHOS 38  BILITOT 0.9   No results for input(s): CHOL in the last 72 hours. No results for input(s): PROTIME in the last 72 hours.  Imaging: Imaging results have been reviewed and Dg Chest 2 View  05/01/2015   CLINICAL  DATA:  Shortness of breath, all worsening.  EXAM: CHEST  2 VIEW  COMPARISON:  05/29/2013  FINDINGS: There is bilateral diffuse interstitial thickening. There is no pleural effusion or pneumothorax. There is stable cardiomegaly.  The osseous structures are unremarkable.  IMPRESSION: Findings most concerning for mild pulmonary edema.   Electronically Signed   By: Kathreen Devoid   On: 05/01/2015 20:04   Nm Myocar Multi W/spect W/wall Motion / Ef  05/03/2015   CLINICAL DATA:  59 year old with chest pain.  EXAM: MYOCARDIAL IMAGING WITH SPECT (REST AND PHARMACOLOGIC-STRESS)  GATED LEFT VENTRICULAR WALL MOTION STUDY  LEFT VENTRICULAR EJECTION FRACTION  TECHNIQUE: Standard myocardial SPECT imaging was performed after resting intravenous injection of 10 mCi Tc-7m sestamibi. Subsequently, intravenous infusion of Lexiscan was performed under the supervision of the Cardiology staff. At peak effect of the drug, 30 mCi Tc-87m sestamibi was injected intravenously and standard myocardial SPECT imaging was performed. Quantitative gated imaging was also performed to evaluate left ventricular wall motion, and estimate left ventricular ejection fraction.  COMPARISON:  05/30/2013  FINDINGS: Perfusion: Again noted is left ventricular dilatation. There is concern for mild reversibility along the anterolateral wall near the base. No other areas are concerning for reversibility or infarct.  Wall Motion: Diffuse mild hypokinesia without focal abnormality.  Left Ventricular Ejection Fraction: 46 % (previously measured 46%).  End diastolic volume [id number]  End  systolic volume [id number] ml  IMPRESSION: 1. Small area of reversibility along the base of the anterolateral wall. This finding is equivocal for pharmacologically induced ischemia based on the location and size. No other areas are concerning for ischemia or infarct.\  2. Left ventricular dilatation with mild hypokinesia.  3. Left ventricular ejection fraction is 46% and stable.  4.  Intermediate-risk stress test findings*.  *2012 Appropriate Use Criteria for Coronary Revascularization Focused Update: J Am Coll Cardiol. N6492421. http://content.airportbarriers.com.aspx?articleid=1201161   Electronically Signed   By: Markus Daft M.D.   On: 05/03/2015 13:49   Ct Angio Chest Aorta W/cm &/or Wo/cm  05/01/2015   CLINICAL DATA:  Acute onset of generalized chest pain. Concern for thoracic aortic dissection. Initial encounter.  EXAM: CT ANGIOGRAPHY CHEST WITH CONTRAST  TECHNIQUE: Multidetector CT imaging of the chest was performed using the standard protocol during bolus administration of intravenous contrast. Multiplanar CT image reconstructions and MIPs were obtained to evaluate the vascular anatomy.  CONTRAST:  180mL OMNIPAQUE IOHEXOL 350 MG/ML SOLN  COMPARISON:  Chest radiograph performed earlier today at 7:21 p.m.  FINDINGS: There is no evidence of aortic dissection. There is no evidence of aneurysmal dilatation. Minimal calcification is noted along the aortic arch. No mural thrombus is seen. There is no evidence of luminal narrowing.  There is no evidence of central pulmonary embolus.  Trace bilateral pleural effusions are noted. Hazy diffuse bilateral airspace opacification is seen, with underlying interstitial prominence, compatible with pulmonary edema. There is no evidence of pneumothorax. No masses are identified; no abnormal focal contrast enhancement is seen.  The heart is enlarged. No significant pericardial effusion is seen. No mediastinal lymphadenopathy is appreciated. Mild calcification is noted at the mitral and aortic valves. Scattered coronary artery calcifications are seen. The great vessels are grossly unremarkable in appearance. No axillary lymphadenopathy is seen. The visualized portions of the thyroid gland are unremarkable in appearance.  The visualized portions of the liver and spleen are unremarkable. The kidneys are diffusely atrophic, with numerous cysts of  varying size, reflecting underlying chronic renal disease. The visualized portions of the gallbladder, pancreas and adrenal glands are grossly unremarkable.  No acute osseous abnormalities are seen.  Review of the MIP images confirms the above findings.  IMPRESSION: 1. No evidence of aortic dissection. No evidence of aneurysmal dilatation. No luminal narrowing seen. Minimal calcification noted along the aortic arch. 2. No evidence of central pulmonary embolus. 3. Hazy diffuse bilateral airspace opacification, with underlying interstitial prominence and trace bilateral pleural effusions, compatible with pulmonary edema. 4. Cardiomegaly noted. 5. Scattered coronary artery calcifications seen. Mild calcification at the mitral and aortic valves. 6. Diffuse renal atrophy and scattered renal cysts noted, reflecting chronic renal disease.   Electronically Signed   By: Garald Balding M.D.   On: 05/01/2015 23:58    Cardiac Studies:  Assessment/Plan:  Atypical chest pain with minimally elevated troponin. I doubt significant MI. ResolvingAcute pulmonary edema secondary to volume overload.rule out ischemia Coronary artery disease, history of MI in the past. Hypertension. End-stage renal disease on hemodialysis. Anemia of chronic disease. Hyperlipidemia. GERD. Status post hypertensive urgency PLAN Continue present management. Will discuss with patient regarding left catheter, possible PCI.  Once fully compensated and will tentatively scheduled for Thursday  LOS: 2 days    Charolette Forward 05/03/2015, 5:20 PM

## 2015-05-03 NOTE — Progress Notes (Signed)
Pt transferring to [place]. Dialysis ready for pt. Consuelo Pandy RM

## 2015-05-04 DIAGNOSIS — I160 Hypertensive urgency: Secondary | ICD-10-CM

## 2015-05-04 DIAGNOSIS — N186 End stage renal disease: Secondary | ICD-10-CM

## 2015-05-04 DIAGNOSIS — D649 Anemia, unspecified: Secondary | ICD-10-CM

## 2015-05-04 DIAGNOSIS — J9601 Acute respiratory failure with hypoxia: Secondary | ICD-10-CM

## 2015-05-04 LAB — RENAL FUNCTION PANEL
ANION GAP: 13 (ref 5–15)
Albumin: 3.1 g/dL — ABNORMAL LOW (ref 3.5–5.0)
BUN: 47 mg/dL — ABNORMAL HIGH (ref 6–20)
CALCIUM: 7.9 mg/dL — AB (ref 8.9–10.3)
CHLORIDE: 98 mmol/L — AB (ref 101–111)
CO2: 24 mmol/L (ref 22–32)
Creatinine, Ser: 16.39 mg/dL — ABNORMAL HIGH (ref 0.61–1.24)
GFR calc non Af Amer: 3 mL/min — ABNORMAL LOW (ref >60)
GFR, EST AFRICAN AMERICAN: 3 mL/min — AB (ref >60)
GLUCOSE: 93 mg/dL (ref 65–99)
Phosphorus: 7.2 mg/dL — ABNORMAL HIGH (ref 2.5–4.6)
Potassium: 4.4 mmol/L (ref 3.5–5.1)
SODIUM: 135 mmol/L (ref 135–145)

## 2015-05-04 LAB — CBC WITH DIFFERENTIAL/PLATELET
Basophils Absolute: 0 10*3/uL (ref 0.0–0.1)
Basophils Relative: 0 %
EOS PCT: 4 %
Eosinophils Absolute: 0.2 10*3/uL (ref 0.0–0.7)
HCT: 31.3 % — ABNORMAL LOW (ref 39.0–52.0)
HEMOGLOBIN: 10.3 g/dL — AB (ref 13.0–17.0)
LYMPHS ABS: 1.3 10*3/uL (ref 0.7–4.0)
LYMPHS PCT: 26 %
MCH: 30.2 pg (ref 26.0–34.0)
MCHC: 32.9 g/dL (ref 30.0–36.0)
MCV: 91.8 fL (ref 78.0–100.0)
MONOS PCT: 9 %
Monocytes Absolute: 0.5 10*3/uL (ref 0.1–1.0)
Neutro Abs: 3.1 10*3/uL (ref 1.7–7.7)
Neutrophils Relative %: 61 %
Platelets: 110 10*3/uL — ABNORMAL LOW (ref 150–400)
RBC: 3.41 MIL/uL — AB (ref 4.22–5.81)
RDW: 14.4 % (ref 11.5–15.5)
WBC: 5.2 10*3/uL (ref 4.0–10.5)

## 2015-05-04 MED ORDER — CALCITRIOL 0.25 MCG PO CAPS
ORAL_CAPSULE | ORAL | Status: AC
  Administered 2015-05-04: 17:00:00
  Filled 2015-05-04: qty 1

## 2015-05-04 MED ORDER — INFLUENZA VAC SPLIT QUAD 0.5 ML IM SUSY
0.5000 mL | PREFILLED_SYRINGE | INTRAMUSCULAR | Status: AC
  Administered 2015-05-05: 0.5 mL via INTRAMUSCULAR
  Filled 2015-05-04: qty 0.5

## 2015-05-04 MED ORDER — CALCITRIOL 0.5 MCG PO CAPS
ORAL_CAPSULE | ORAL | Status: AC
Start: 2015-05-04 — End: 2015-05-04
  Administered 2015-05-04: 17:00:00
  Filled 2015-05-04: qty 4

## 2015-05-04 NOTE — Progress Notes (Signed)
Subjective:   Feels well. Denies chest pain/sob. Stress test yesterday- he says not planning to do cath.  Objective Filed Vitals:   05/03/15 1700 05/03/15 2047 05/04/15 0500 05/04/15 0558  BP: 166/90 189/92  182/92  Pulse: 76 79  82  Temp: 98.4 F (36.9 C) 98.6 F (37 C)  98.4 F (36.9 C)  TempSrc: Oral Oral  Oral  Resp: 20 18  17   Height:      Weight:  101.5 kg (223 lb 12.3 oz) 103.783 kg (228 lb 12.8 oz)   SpO2: 100% 98%  98%   Physical Exam General: alert and oriented. No acute distress Heart: RRR Lungs: CTA, unlabored  Abdomen: soft, nontender +BS  Extremities: no edema   Dialysis Access:  L AVF +b/t   Dialysis Orders: [facility] 4 hr 15 mins 101.5kgs 2K/2Ca+ Heparin 3500 Venofer 100 x10- stop date 10/24 Calcitriol 2.25   Assessment/Plan: 1. SOB- reports improved after HD Friday. No evidence of PE. Pulm edema on xray- cont volume removal as tolerated 2. Chest pain- cardiology following. Stress test yesterday. EF 45%. Cardiology notes possible cath Thursday 3. ESRD - MWF [place], HD pending today.  4. Hypertension/volume - edw lowered 1 kg last week- cont to lower edw as tolerated. Home meds amlodpine, carvedilol, losartan 5. Anemia - hgb 10.6. No ESA, cont Fe bolus. Last tsat 27 6. Metabolic bone disease - Cont binder and calcitriol. Last pth 173 and pthos 3.7 7. Nutrition - renal diet. Vitamin. Alb 3.3  Shelle Iron, NP [place] [phone] 05/04/2015,9:30 AM  LOS: 3 days   Pt seen, examined and agree w A/P as above.  Kelly Splinter MD pager [phone]    [phone] 05/04/2015, 11:44 AM    Additional Objective Labs: Basic Metabolic Panel:  Recent Labs Lab 05/01/15 1907 05/02/15 0034 05/02/15 0608 05/03/15 0925  NA 140  --  141 139  K 5.0  --  4.9 4.5  CL 100*  --  101 101  CO2 27  --  28 26  GLUCOSE 86  --  78 85  BUN 44*  --  52* 35*  CREATININE 15.93* 16.82* 17.50* 13.62*  CALCIUM 9.1  --  8.7* 8.4*    Liver Function Tests:  Recent Labs Lab 05/02/15 0608  AST 17  ALT 14*  ALKPHOS 38  BILITOT 0.9  PROT 6.4*  ALBUMIN 3.3*   No results for input(s): LIPASE, AMYLASE in the last 168 hours. CBC:  Recent Labs Lab 05/01/15 1907 05/02/15 0034 05/02/15 0608  WBC 6.4 6.4 6.7  NEUTROABS  --   --  4.5  HGB 11.1* 10.8* 10.6*  HCT 34.9* 34.0* 33.2*  MCV 92.3 92.4 92.5  PLT 125* 114* 118*   Blood Culture    Component Value Date/Time   SDES URINE, CLEAN CATCH 07/04/2010 0532   SPECREQUEST NONE 07/04/2010 0532   CULT NO GROWTH 07/04/2010 0532   REPTSTATUS 07/05/2010 FINAL 07/04/2010 0532    Cardiac Enzymes:  Recent Labs Lab 05/02/15 0034 05/02/15 0608 05/02/15 1200  TROPONINI 0.15* 0.13* 0.11*   CBG: No results for input(s): GLUCAP in the last 168 hours. Iron Studies: No results for input(s): IRON, TIBC, TRANSFERRIN, FERRITIN in the last 72 hours. @lablastinr3 @ Studies/Results: Nm Myocar Multi W/spect W/wall Motion / Ef  05/03/2015   CLINICAL DATA:  59 year old with chest pain.  EXAM: MYOCARDIAL IMAGING WITH SPECT (REST AND PHARMACOLOGIC-STRESS)  GATED LEFT VENTRICULAR WALL MOTION STUDY  LEFT VENTRICULAR EJECTION FRACTION  TECHNIQUE: Standard myocardial  SPECT imaging was performed after resting intravenous injection of 10 mCi Tc-68m sestamibi. Subsequently, intravenous infusion of Lexiscan was performed under the supervision of the Cardiology staff. At peak effect of the drug, 30 mCi Tc-79m sestamibi was injected intravenously and standard myocardial SPECT imaging was performed. Quantitative gated imaging was also performed to evaluate left ventricular wall motion, and estimate left ventricular ejection fraction.  COMPARISON:  05/30/2013  FINDINGS: Perfusion: Again noted is left ventricular dilatation. There is concern for mild reversibility along the anterolateral wall near the base. No other areas are concerning for reversibility or infarct.  Wall Motion: Diffuse mild  hypokinesia without focal abnormality.  Left Ventricular Ejection Fraction: 46 % (previously measured 46%).  End diastolic volume [id number] ml  End systolic volume [id number] ml  IMPRESSION: 1. Small area of reversibility along the base of the anterolateral wall. This finding is equivocal for pharmacologically induced ischemia based on the location and size. No other areas are concerning for ischemia or infarct.\  2. Left ventricular dilatation with mild hypokinesia.  3. Left ventricular ejection fraction is 46% and stable.  4. Intermediate-risk stress test findings*.  *2012 Appropriate Use Criteria for Coronary Revascularization Focused Update: J Am Coll Cardiol. N6492421. http://content.airportbarriers.com.aspx?articleid=1201161   Electronically Signed   By: Markus Daft M.D.   On: 05/03/2015 13:49   Medications:   . amLODipine  10 mg Oral QHS  . aspirin EC  325 mg Oral Daily  . atorvastatin  20 mg Oral q1800  . calcitRIOL  2.25 mcg Oral Q M,W,F-HD  . enoxaparin (LOVENOX) injection  30 mg Subcutaneous Daily  . ferric gluconate (FERRLECIT/NULECIT) IV  62.5 mg Intravenous Q M,W,F-HD  . labetalol  200 mg Oral QHS  . losartan  100 mg Oral QHS  . multivitamin  1 tablet Oral QHS  . sodium chloride  3 mL Intravenous Q12H

## 2015-05-04 NOTE — Progress Notes (Signed)
PROGRESS NOTE    Jon [name] [id number] DOB: 09-10-1955 DOA: 05/01/2015 PCP: Theressa Millard, MD  HPI/Brief narrative  59 year old male , PMH of ESRD on MWF HD, HTN, chronic anemia, GERD, was admitted to [facility] on 05/01/15 with complaints of chest pain and dyspnea and was admitted for evaluation and management of acute respiratory failure with hypoxia and chest pain. CTA chest negative for PE or dissection. troponins elevated. Cardiology consulted and stress test was mildly positive and showed a small area of reversible ischemia in the anterolateral wall at the base with EF of 46%. Cardiology plans cardiac cath 10/6. Nephrology seen for dialysis needs.   Assessment/Plan:  Atypical chest pain with minimally elevated troponin -  Peak troponin of 0.15. Flat trend. -  Has history of CAD and MI in the past. -  CTA chest : No evidence of dissection or PE -  Mildly positive nuclear stress test. Cardiology following and plan for cardiac cath 10/ 6 -  No further chest pain reported.   Acute respiratory failure with hypoxia -  Possibly related to volume overload from ESRD and uncontrolled hypertension -  Resolved   ESRD -  Follow G following and patient on MWF dialysis -  Underwent dialysis 10/5.   Anemia in chronic kidney disease -  Stable   Thrombocytopenia -  Unclear etiology but stable  Hypertensive urgency -  Although blood pressure control has improved, still not optimal. Continue labetalol-may consider increasing to twice a day. Continue losartan.   DVT prophylaxis: Lovenox Code Status:  full Family Communication:  None at bedside Disposition Plan:  DC home pending cardiac workup   Consultants:  Nephrology  Cardiology  Procedures:   hemodialysis  Antibiotics:   none   Subjective:  denied chest pain or dyspnea. Wants to have a bath.  Objective: Filed Vitals:   05/04/15 1315 05/04/15 1345 05/04/15 1415 05/04/15 1418  BP: 186/98 169/94 142/84 152/86    Pulse: 77 79 80 79  Temp:    98.1 F (36.7 C)  TempSrc:    Oral  Resp:    18  Height:      Weight:    99.1 kg (218 lb 7.6 oz)  SpO2:    100%    Intake/Output Summary (Last 24 hours) at 05/04/15 1516 Last data filed at 05/04/15 1418  Gross per 24 hour  Intake    480 ml  Output   4000 ml  Net  -3520 ml   Filed Weights   05/04/15 0500 05/04/15 1000 05/04/15 1418  Weight: 103.783 kg (228 lb 12.8 oz) 103.8 kg (228 lb 13.4 oz) 99.1 kg (218 lb 7.6 oz)     Exam:  General exam:  Pleasant middle-aged male lying comfortably in bed Respiratory system: Clear. No increased work of breathing. Cardiovascular system: S1 & S2 heard, RRR. No JVD, murmurs, gallops, clicks or pedal edema. Non tele Gastrointestinal system: Abdomen is nondistended, soft and nontender. Normal bowel sounds heard. Central nervous system: Alert and oriented. No focal neurological deficits. Extremities: Symmetric 5 x 5 power.   Data Reviewed: Basic Metabolic Panel:  Recent Labs Lab 05/01/15 1907 05/02/15 0034 05/02/15 0608 05/03/15 0925 05/04/15 1030  NA 140  --  141 139 135  K 5.0  --  4.9 4.5 4.4  CL 100*  --  101 101 98*  CO2 27  --  28 26 24   GLUCOSE 86  --  78 85 93  BUN 44*  --  52* 35* 47*  CREATININE  15.93* 16.82* 17.50* 13.62* 16.39*  CALCIUM 9.1  --  8.7* 8.4* 7.9*  PHOS  --   --   --   --  7.2*   Liver Function Tests:  Recent Labs Lab 05/02/15 0608 05/04/15 1030  AST 17  --   ALT 14*  --   ALKPHOS 38  --   BILITOT 0.9  --   PROT 6.4*  --   ALBUMIN 3.3* 3.1*   No results for input(s): LIPASE, AMYLASE in the last 168 hours. No results for input(s): AMMONIA in the last 168 hours. CBC:  Recent Labs Lab 05/01/15 1907 05/02/15 0034 05/02/15 0608 05/04/15 1030  WBC 6.4 6.4 6.7 5.2  NEUTROABS  --   --  4.5 3.1  HGB 11.1* 10.8* 10.6* 10.3*  HCT 34.9* 34.0* 33.2* 31.3*  MCV 92.3 92.4 92.5 91.8  PLT 125* 114* 118* 110*   Cardiac Enzymes:  Recent Labs Lab 05/02/15 0034  05/02/15 0608 05/02/15 1200  TROPONINI 0.15* 0.13* 0.11*   BNP (last 3 results) No results for input(s): PROBNP in the last 8760 hours. CBG: No results for input(s): GLUCAP in the last 168 hours.  Recent Results (from the past 240 hour(s))  MRSA PCR Screening     Status: None   Collection Time: 05/02/15 12:39 AM  Result Value Ref Range Status   MRSA by PCR NEGATIVE NEGATIVE Final    Comment:        The GeneXpert MRSA Assay (FDA approved for NASAL specimens only), is one component of a comprehensive MRSA colonization surveillance program. It is not intended to diagnose MRSA infection nor to guide or monitor treatment for MRSA infections.            Studies: Nm Myocar Multi W/spect W/wall Motion / Ef  05/03/2015   CLINICAL DATA:  59 year old with chest pain.  EXAM: MYOCARDIAL IMAGING WITH SPECT (REST AND PHARMACOLOGIC-STRESS)  GATED LEFT VENTRICULAR WALL MOTION STUDY  LEFT VENTRICULAR EJECTION FRACTION  TECHNIQUE: Standard myocardial SPECT imaging was performed after resting intravenous injection of 10 mCi Tc-59m sestamibi. Subsequently, intravenous infusion of Lexiscan was performed under the supervision of the Cardiology staff. At peak effect of the drug, 30 mCi Tc-22m sestamibi was injected intravenously and standard myocardial SPECT imaging was performed. Quantitative gated imaging was also performed to evaluate left ventricular wall motion, and estimate left ventricular ejection fraction.  COMPARISON:  05/30/2013  FINDINGS: Perfusion: Again noted is left ventricular dilatation. There is concern for mild reversibility along the anterolateral wall near the base. No other areas are concerning for reversibility or infarct.  Wall Motion: Diffuse mild hypokinesia without focal abnormality.  Left Ventricular Ejection Fraction: 46 % (previously measured 46%).  End diastolic volume [id number] ml  End systolic volume [id number] ml  IMPRESSION: 1. Small area of reversibility along the base of the  anterolateral wall. This finding is equivocal for pharmacologically induced ischemia based on the location and size. No other areas are concerning for ischemia or infarct.\  2. Left ventricular dilatation with mild hypokinesia.  3. Left ventricular ejection fraction is 46% and stable.  4. Intermediate-risk stress test findings*.  *2012 Appropriate Use Criteria for Coronary Revascularization Focused Update: J Am Coll Cardiol. N6492421. http://content.[email or web address].aspx?articleid=1201161   Electronically Signed   By: Markus Daft M.D.   On: 05/03/2015 13:49        Scheduled Meds: . amLODipine  10 mg Oral QHS  . aspirin EC  325 mg Oral Daily  . atorvastatin  20 mg Oral q1800  .  calcitRIOL      . calcitRIOL      . calcitRIOL  2.25 mcg Oral Q M,W,F-HD  . enoxaparin (LOVENOX) injection  30 mg Subcutaneous Daily  . ferric gluconate (FERRLECIT/NULECIT) IV  62.5 mg Intravenous Q M,W,F-HD  . labetalol  200 mg Oral QHS  . losartan  100 mg Oral QHS  . multivitamin  1 tablet Oral QHS  . sodium chloride  3 mL Intravenous Q12H   Continuous Infusions:   Principal Problem:   Acute respiratory failure with hypoxia (HCC) Active Problems:   End stage renal disease (HCC)   Pulmonary edema   Hypertensive urgency   Chronic anemia    Time spent: 25 minutes    HONGALGI,ANAND, MD, FACP, FHM. [facility] Pager [phone]  If 7PM-7AM, please contact night-coverage [email or web address] Password [email or web address] 05/04/2015, 3:16 PM    LOS: 3 days

## 2015-05-04 NOTE — Care Management Important Message (Signed)
Important Message  Patient Details  Name: Jon [name] MRN: [id number] Date of Birth: Feb 05, 1956   Medicare Important Message Given:  Yes-second notification given    Delorse Lek 05/04/2015, 1:25 PM

## 2015-05-04 NOTE — Progress Notes (Signed)
Subjective:  Denies any chest pain or shortness of breath.  Discussed with patient regarding nuclear stress test results and various options of treatment, i.e., medical versus invasive left, possible PTCA stenting.  Consents for PCI.  Objective:  Vital Signs in the last 24 hours: Temp:  [98 F (36.7 C)-98.6 F (37 C)] 98.1 F (36.7 C) (10/05 1000) Pulse Rate:  [73-82] 79 (10/05 1016) Resp:  [17-33] 18 (10/05 1000) BP: (166-189)/(87-101) 170/93 mmHg (10/05 1016) SpO2:  [97 %-100 %] 99 % (10/05 1000) Weight:  [101.5 kg (223 lb 12.3 oz)-103.8 kg (228 lb 13.4 oz)] 103.8 kg (228 lb 13.4 oz) (10/05 1000)  Intake/Output from previous day: 10/04 0701 - 10/05 0700 In: 480 [P.O.:480] Out: -  Intake/Output from this shift: Total I/O In: 240 [P.O.:240] Out: -   Physical Exam: Neck: no adenopathy, no carotid bruit, no JVD and supple, symmetrical, trachea midline Lungs: clear to auscultation bilaterally Heart: regular rate and rhythm, S1, S2 normal and soft systolic murmur noted Abdomen: soft, non-tender; bowel sounds normal; no masses,  no organomegaly Extremities: extremities normal, atraumatic, no cyanosis or edema  Lab Results:  Recent Labs  05/02/15 0608 05/04/15 1030  WBC 6.7 5.2  HGB 10.6* 10.3*  PLT 118* 110*    Recent Labs  05/03/15 0925 05/04/15 1030  NA 139 135  K 4.5 4.4  CL 101 98*  CO2 26 24  GLUCOSE 85 93  BUN 35* 47*  CREATININE 13.62* 16.39*    Recent Labs  05/02/15 0608 05/02/15 1200  TROPONINI 0.13* 0.11*   Hepatic Function Panel  Recent Labs  05/02/15 0608 05/04/15 1030  PROT 6.4*  --   ALBUMIN 3.3* 3.1*  AST 17  --   ALT 14*  --   ALKPHOS 38  --   BILITOT 0.9  --    No results for input(s): CHOL in the last 72 hours. No results for input(s): PROTIME in the last 72 hours.  Imaging: Imaging results have been reviewed and Nm Myocar Multi W/spect W/wall Motion / Ef  05/03/2015   CLINICAL DATA:  59 year old with chest pain.  EXAM:  MYOCARDIAL IMAGING WITH SPECT (REST AND PHARMACOLOGIC-STRESS)  GATED LEFT VENTRICULAR WALL MOTION STUDY  LEFT VENTRICULAR EJECTION FRACTION  TECHNIQUE: Standard myocardial SPECT imaging was performed after resting intravenous injection of 10 mCi Tc-78m sestamibi. Subsequently, intravenous infusion of Lexiscan was performed under the supervision of the Cardiology staff. At peak effect of the drug, 30 mCi Tc-75m sestamibi was injected intravenously and standard myocardial SPECT imaging was performed. Quantitative gated imaging was also performed to evaluate left ventricular wall motion, and estimate left ventricular ejection fraction.  COMPARISON:  05/30/2013  FINDINGS: Perfusion: Again noted is left ventricular dilatation. There is concern for mild reversibility along the anterolateral wall near the base. No other areas are concerning for reversibility or infarct.  Wall Motion: Diffuse mild hypokinesia without focal abnormality.  Left Ventricular Ejection Fraction: 46 % (previously measured 46%).  End diastolic volume [id number] ml  End systolic volume Q000111Q ml  IMPRESSION: 1. Small area of reversibility along the base of the anterolateral wall. This finding is equivocal for pharmacologically induced ischemia based on the location and size. No other areas are concerning for ischemia or infarct.\  2. Left ventricular dilatation with mild hypokinesia.  3. Left ventricular ejection fraction is 46% and stable.  4. Intermediate-risk stress test findings*.  *2012 Appropriate Use Criteria for Coronary Revascularization Focused Update: J Am Coll Cardiol. [id number]. http://content.[email or web address].aspx?articleid=1201161   Electronically Signed  By: Markus Daft M.D.   On: 05/03/2015 13:49    Cardiac Studies:  Assessment/Plan:  Atypical chest pain with minimally elevated troponin. Doubt significant [email or web address] positive nuclear stress test ResolvingAcute pulmonary edema secondary to volume overload.rule out  ischemia Coronary artery disease, history of MI in the past. Hypertension. End-stage renal disease on hemodialysis. Anemia of chronic disease. Hyperlipidemia. GERD. Status post hypertensive urgency PLAN Discussed with patient.  Nuclear stress test results and various options of treatment, i.e., medical versus invasive left, possible PTCA stenting.  His risk and benefit i.e., death, MI, stroke, need for emergency CABG, local vascular complications, etc., and consents for PCI  LOS: 3 days    Charolette Forward 05/04/2015, 11:30 AM

## 2015-05-05 ENCOUNTER — Encounter (HOSPITAL_COMMUNITY): Payer: Self-pay | Admitting: Cardiology

## 2015-05-05 ENCOUNTER — Encounter (HOSPITAL_COMMUNITY)
Admission: EM | Disposition: A | Discharge status: Home / Self Care | Payer: Self-pay | Source: Home / Self Care | Attending: Internal Medicine

## 2015-05-05 DIAGNOSIS — R9439 Abnormal result of other cardiovascular function study: Secondary | ICD-10-CM

## 2015-05-05 DIAGNOSIS — R0789 Other chest pain: Secondary | ICD-10-CM

## 2015-05-05 DIAGNOSIS — I120 Hypertensive chronic kidney disease with stage 5 chronic kidney disease or end stage renal disease: Secondary | ICD-10-CM | POA: Diagnosis not present

## 2015-05-05 DIAGNOSIS — R7989 Other specified abnormal findings of blood chemistry: Secondary | ICD-10-CM

## 2015-05-05 DIAGNOSIS — I10 Essential (primary) hypertension: Secondary | ICD-10-CM

## 2015-05-05 DIAGNOSIS — D631 Anemia in chronic kidney disease: Secondary | ICD-10-CM

## 2015-05-05 DIAGNOSIS — N189 Chronic kidney disease, unspecified: Secondary | ICD-10-CM

## 2015-05-05 HISTORY — PX: CARDIAC CATHETERIZATION: SHX172

## 2015-05-05 LAB — PROTIME-INR
INR: 1.22 (ref 0.00–1.49)
Prothrombin Time: 15.5 seconds — ABNORMAL HIGH (ref 11.6–15.2)

## 2015-05-05 SURGERY — LEFT HEART CATH AND CORONARY ANGIOGRAPHY

## 2015-05-05 MED ORDER — SODIUM CHLORIDE 0.9 % IV SOLN
250.0000 mL | INTRAVENOUS | Status: DC | PRN
Start: 2015-05-05 — End: 2015-05-06

## 2015-05-05 MED ORDER — SODIUM CHLORIDE 0.9 % IJ SOLN: 3.0000 mL | INTRAMUSCULAR | Status: DC | PRN

## 2015-05-05 MED ORDER — SODIUM CHLORIDE 0.9 % IV SOLN: 250.0000 mL | INTRAVENOUS | Status: DC | PRN

## 2015-05-05 MED ORDER — IOHEXOL 350 MG/ML SOLN
INTRAVENOUS | Status: DC | PRN
  Administered 2015-05-05: 100 mL via INTRA_ARTERIAL

## 2015-05-05 MED ORDER — MIDAZOLAM HCL 2 MG/2ML IJ SOLN
INTRAMUSCULAR | Status: DC | PRN
  Administered 2015-05-05: 1 mg via INTRAVENOUS

## 2015-05-05 MED ORDER — SODIUM CHLORIDE 0.9 % IV SOLN
250.0000 mL | INTRAVENOUS | Status: DC | PRN
Start: 2015-05-05 — End: 2015-05-05

## 2015-05-05 MED ORDER — LIDOCAINE HCL (PF) 1 % IJ SOLN
INTRAMUSCULAR | Status: AC
  Filled 2015-05-05: qty 30

## 2015-05-05 MED ORDER — HEPARIN (PORCINE) IN NACL 2-0.9 UNIT/ML-% IJ SOLN
INTRAMUSCULAR | Status: AC
  Filled 2015-05-05: qty 1000

## 2015-05-05 MED ORDER — HYDRALAZINE HCL 20 MG/ML IJ SOLN
INTRAMUSCULAR | Status: DC | PRN
  Administered 2015-05-05 (×2): 10 mg via INTRAVENOUS

## 2015-05-05 MED ORDER — HEPARIN (PORCINE) IN NACL 2-0.9 UNIT/ML-% IJ SOLN
INTRAMUSCULAR | Status: DC | PRN
  Administered 2015-05-05: 12:00:00

## 2015-05-05 MED ORDER — LABETALOL HCL 200 MG PO TABS
200.0000 mg | ORAL_TABLET | Freq: Two times a day (BID) | ORAL | Status: DC
  Administered 2015-05-05 – 2015-05-06 (×2): 200 mg via ORAL
  Filled 2015-05-05 (×2): qty 1

## 2015-05-05 MED ORDER — ASPIRIN 81 MG PO CHEW: 81.0000 mg | CHEWABLE_TABLET | ORAL | Status: DC

## 2015-05-05 MED ORDER — CEFAZOLIN SODIUM-DEXTROSE 2-3 GM-% IV SOLR
INTRAVENOUS | Status: DC | PRN
  Administered 2015-05-05: 2 g via INTRAVENOUS

## 2015-05-05 MED ORDER — ONDANSETRON HCL 4 MG/2ML IJ SOLN
4.0000 mg | Freq: Four times a day (QID) | INTRAMUSCULAR | Status: DC | PRN
  Administered 2015-05-05: 4 mg via INTRAVENOUS
  Filled 2015-05-05: qty 2

## 2015-05-05 MED ORDER — SODIUM CHLORIDE 0.9 % IV SOLN
INTRAVENOUS | Status: DC | PRN
  Administered 2015-05-05: 10 mL/h via INTRAVENOUS

## 2015-05-05 MED ORDER — SPIRONOLACTONE 25 MG PO TABS
25.0000 mg | ORAL_TABLET | Freq: Every day | ORAL | Status: DC
  Administered 2015-05-05 – 2015-05-06 (×2): 25 mg via ORAL
  Filled 2015-05-05 (×2): qty 1

## 2015-05-05 MED ORDER — DARBEPOETIN ALFA 40 MCG/0.4ML IJ SOSY
40.0000 ug | PREFILLED_SYRINGE | INTRAMUSCULAR | Status: DC
  Administered 2015-05-06: 40 ug via INTRAVENOUS
  Filled 2015-05-05: qty 0.4

## 2015-05-05 MED ORDER — ACETAMINOPHEN 325 MG PO TABS: 650.0000 mg | ORAL_TABLET | ORAL | Status: DC | PRN

## 2015-05-05 MED ORDER — LIDOCAINE HCL (PF) 1 % IJ SOLN
INTRAMUSCULAR | Status: DC | PRN
  Administered 2015-05-05: 20 mL

## 2015-05-05 MED ORDER — SODIUM CHLORIDE 0.9 % IJ SOLN
3.0000 mL | Freq: Two times a day (BID) | INTRAMUSCULAR | Status: DC
  Administered 2015-05-05: 3 mL via INTRAVENOUS

## 2015-05-05 MED ORDER — MIDAZOLAM HCL 2 MG/2ML IJ SOLN
INTRAMUSCULAR | Status: AC
  Filled 2015-05-05: qty 4

## 2015-05-05 MED ORDER — SODIUM CHLORIDE 0.9 % IV SOLN: INTRAVENOUS | Status: DC

## 2015-05-05 MED ORDER — SODIUM CHLORIDE 0.9 % IJ SOLN: 3.0000 mL | Freq: Two times a day (BID) | INTRAMUSCULAR | Status: DC

## 2015-05-05 MED ORDER — HYDRALAZINE HCL 20 MG/ML IJ SOLN
INTRAMUSCULAR | Status: AC
  Filled 2015-05-05: qty 1

## 2015-05-05 MED ORDER — FENTANYL CITRATE (PF) 100 MCG/2ML IJ SOLN
INTRAMUSCULAR | Status: AC
  Filled 2015-05-05: qty 4

## 2015-05-05 MED ORDER — FENTANYL CITRATE (PF) 100 MCG/2ML IJ SOLN
INTRAMUSCULAR | Status: DC | PRN
  Administered 2015-05-05: 25 ug via INTRAVENOUS

## 2015-05-05 MED ORDER — CEFAZOLIN SODIUM-DEXTROSE 2-3 GM-% IV SOLR
INTRAVENOUS | Status: AC
  Filled 2015-05-05: qty 50

## 2015-05-05 SURGICAL SUPPLY — 11 items
CATH INFINITI 5FR MULTPACK ANG (CATHETERS) ×3 IMPLANT
DEVICE CLOSURE PERCLS PRGLD 6F (VASCULAR PRODUCTS) ×1 IMPLANT
KIT HEART LEFT (KITS) ×3 IMPLANT
PACK CARDIAC CATHETERIZATION (CUSTOM PROCEDURE TRAY) ×3 IMPLANT
PERCLOSE PROGLIDE 6F (VASCULAR PRODUCTS) ×3
SHEATH PINNACLE 5F 10CM (SHEATH) ×3 IMPLANT
SYR MEDRAD MARK V 150ML (SYRINGE) ×3 IMPLANT
TRANSDUCER W/STOPCOCK (MISCELLANEOUS) ×3 IMPLANT
WIRE EMERALD 3MM-J .035X150CM (WIRE) ×3 IMPLANT
WIRE HITORQ VERSACORE ST 145CM (WIRE) ×3 IMPLANT
WIRE J 3MM .035X260CM (WIRE) ×3 IMPLANT

## 2015-05-05 NOTE — Progress Notes (Signed)
Nutrition Brief Note  Patient identified on the Malnutrition Screening Tool (MST) Report  Wt Readings from Last 15 Encounters:  05/04/15 218 lb 9 oz (99.14 kg)  04/20/15 226 lb (102.513 kg)  03/08/15 227 lb (102.967 kg)  03/04/15 227 lb (102.967 kg)  05/29/13 232 lb (105.235 kg)  02/23/13 229 lb (103.874 kg)  02/05/13 223 lb 1.7 oz (101.2 kg)  01/26/13 237 lb (107.502 kg)  05/12/12 233 lb (105.688 kg)  05/07/12 233 lb 3.2 oz (105.779 kg)    Body mass index is 29.64 kg/(m^2). Patient meets criteria for overweight based on current BMI. Weight loss due to fluids.   Current diet order is clear liquids as pt currently post cardiac cath. Prior to procedure today, patient was consuming approximately 100% of meals. Labs and medications reviewed.   No nutrition interventions warranted at this time. If nutrition issues arise, please consult RD.   Corrin [name], MS, RD, LDN Pager # [phone] After hours/ weekend pager # [phone]

## 2015-05-05 NOTE — H&P (View-Only) (Signed)
Subjective:  Denies any chest pain or shortness of breath.  Discussed with patient regarding nuclear stress test results and various options of treatment, i.e., medical versus invasive left, possible PTCA stenting.  Consents for PCI.  Objective:  Vital Signs in the last 24 hours: Temp:  [98 F (36.7 C)-98.6 F (37 C)] 98.1 F (36.7 C) (10/05 1000) Pulse Rate:  [73-82] 79 (10/05 1016) Resp:  [17-33] 18 (10/05 1000) BP: (166-189)/(87-101) 170/93 mmHg (10/05 1016) SpO2:  [97 %-100 %] 99 % (10/05 1000) Weight:  [101.5 kg (223 lb 12.3 oz)-103.8 kg (228 lb 13.4 oz)] 103.8 kg (228 lb 13.4 oz) (10/05 1000)  Intake/Output from previous day: 10/04 0701 - 10/05 0700 In: 480 [P.O.:480] Out: -  Intake/Output from this shift: Total I/O In: 240 [P.O.:240] Out: -   Physical Exam: Neck: no adenopathy, no carotid bruit, no JVD and supple, symmetrical, trachea midline Lungs: clear to auscultation bilaterally Heart: regular rate and rhythm, S1, S2 normal and soft systolic murmur noted Abdomen: soft, non-tender; bowel sounds normal; no masses,  no organomegaly Extremities: extremities normal, atraumatic, no cyanosis or edema  Lab Results:  Recent Labs  05/02/15 0608 05/04/15 1030  WBC 6.7 5.2  HGB 10.6* 10.3*  PLT 118* 110*    Recent Labs  05/03/15 0925 05/04/15 1030  NA 139 135  K 4.5 4.4  CL 101 98*  CO2 26 24  GLUCOSE 85 93  BUN 35* 47*  CREATININE 13.62* 16.39*    Recent Labs  05/02/15 0608 05/02/15 1200  TROPONINI 0.13* 0.11*   Hepatic Function Panel  Recent Labs  05/02/15 0608 05/04/15 1030  PROT 6.4*  --   ALBUMIN 3.3* 3.1*  AST 17  --   ALT 14*  --   ALKPHOS 38  --   BILITOT 0.9  --    No results for input(s): CHOL in the last 72 hours. No results for input(s): PROTIME in the last 72 hours.  Imaging: Imaging results have been reviewed and Nm Myocar Multi W/spect W/wall Motion / Ef  05/03/2015   CLINICAL DATA:  59 year old with chest pain.  EXAM:  MYOCARDIAL IMAGING WITH SPECT (REST AND PHARMACOLOGIC-STRESS)  GATED LEFT VENTRICULAR WALL MOTION STUDY  LEFT VENTRICULAR EJECTION FRACTION  TECHNIQUE: Standard myocardial SPECT imaging was performed after resting intravenous injection of 10 mCi Tc-46m sestamibi. Subsequently, intravenous infusion of Lexiscan was performed under the supervision of the Cardiology staff. At peak effect of the drug, 30 mCi Tc-62m sestamibi was injected intravenously and standard myocardial SPECT imaging was performed. Quantitative gated imaging was also performed to evaluate left ventricular wall motion, and estimate left ventricular ejection fraction.  COMPARISON:  05/30/2013  FINDINGS: Perfusion: Again noted is left ventricular dilatation. There is concern for mild reversibility along the anterolateral wall near the base. No other areas are concerning for reversibility or infarct.  Wall Motion: Diffuse mild hypokinesia without focal abnormality.  Left Ventricular Ejection Fraction: 46 % (previously measured 46%).  End diastolic volume [id number] ml  End systolic volume Q000111Q ml  IMPRESSION: 1. Small area of reversibility along the base of the anterolateral wall. This finding is equivocal for pharmacologically induced ischemia based on the location and size. No other areas are concerning for ischemia or infarct.\  2. Left ventricular dilatation with mild hypokinesia.  3. Left ventricular ejection fraction is 46% and stable.  4. Intermediate-risk stress test findings*.  *2012 Appropriate Use Criteria for Coronary Revascularization Focused Update: J Am Coll Cardiol. N6492421. http://content.[email or web address].aspx?articleid=1201161   Electronically Signed  By: Markus Daft M.D.   On: 05/03/2015 13:49    Cardiac Studies:  Assessment/Plan:  Atypical chest pain with minimally elevated troponin. Doubt significant [email or web address] positive nuclear stress test ResolvingAcute pulmonary edema secondary to volume overload.rule out  ischemia Coronary artery disease, history of MI in the past. Hypertension. End-stage renal disease on hemodialysis. Anemia of chronic disease. Hyperlipidemia. GERD. Status post hypertensive urgency PLAN Discussed with patient.  Nuclear stress test results and various options of treatment, i.e., medical versus invasive left, possible PTCA stenting.  His risk and benefit i.e., death, MI, stroke, need for emergency CABG, local vascular complications, etc., and consents for PCI  LOS: 3 days    Charolette Forward 05/04/2015, 11:30 AM

## 2015-05-05 NOTE — Progress Notes (Signed)
PROGRESS NOTE    Jon [name] KVQ:[id number] DOB: Jul 22, 1956 DOA: 05/01/2015 PCP: [name], MD  HPI/Brief narrative  59 year old male , PMH of ESRD on MWF HD, HTN, chronic anemia, GERD, was admitted to [facility] on 05/01/15 with complaints of chest pain and dyspnea and was admitted for evaluation and management of acute respiratory failure with hypoxia and chest pain. CTA chest negative for PE or dissection. troponins elevated. Cardiology consulted and stress test was mildly positive and showed a small area of reversible ischemia in the anterolateral wall at the base with EF of 46%. Cardiology plans cardiac cath 10/6. Nephrology seen for dialysis needs.   Assessment/Plan:  Atypical chest pain with minimally elevated troponin -  Peak troponin of 0.15. Flat trend. -  Has history of CAD and MI in the past. -  CTA chest : No evidence of dissection or PE -  Mildly positive nuclear stress test. Cardiology following and plan for cardiac cath 10/6 -  No further chest pain reported.   Acute respiratory failure with hypoxia -  Possibly related to volume overload from ESRD and uncontrolled hypertension -  Resolved   ESRD -  Follow G following and patient on MWF dialysis -  Underwent dialysis 10/5.   Anemia in chronic kidney disease -  Stable   Thrombocytopenia -  Unclear etiology but stable  Hypertensive urgency -  Although blood pressure control has improved, still not optimal. Continue labetalol-may consider increasing to twice a day. Continue losartan.   DVT prophylaxis: Lovenox Code Status:  full Family Communication:  None at bedside Disposition Plan:  DC home pending cardiac workup   Consultants:  Nephrology  Cardiology  Procedures:   hemodialysis  Antibiotics:   none   Subjective:  denied chest pain or dyspnea. States that he felt "great" after a bath and slept well last night. Waiting for cardiac cath.  Objective: Filed Vitals:   05/04/15 2041 05/05/15  0503 05/05/15 0944 05/05/15 1056  BP: 185/91 180/93 173/80   Pulse: 76 79 79   Temp: 98.5 F (36.9 C) 98 F (36.7 C) 98.2 F (36.8 C)   TempSrc: Oral Oral Oral   Resp: 18 20 18    Height:      Weight: 99.14 kg (218 lb 9 oz)     SpO2: 100% 100% 100% 100%    Intake/Output Summary (Last 24 hours) at 05/05/15 1118 Last data filed at 05/05/15 0908  Gross per 24 hour  Intake      0 ml  Output   4000 ml  Net  -4000 ml   Filed Weights   05/04/15 1000 05/04/15 1418 05/04/15 2041  Weight: 103.8 kg (228 lb 13.4 oz) 99.1 kg (218 lb 7.6 oz) 99.14 kg (218 lb 9 oz)     Exam:  General exam:  Pleasant middle-aged male lying comfortably in bed Respiratory system: Clear. No increased work of breathing. Cardiovascular system: S1 & S2 heard, RRR. No JVD, murmurs, gallops, clicks or pedal edema. Non tele Gastrointestinal system: Abdomen is nondistended, soft and nontender. Normal bowel sounds heard. Central nervous system: Alert and oriented. No focal neurological deficits. Extremities: Symmetric 5 x 5 power.   Data Reviewed: Basic Metabolic Panel:  Recent Labs Lab 05/01/15 1907 05/02/15 0034 05/02/15 0608 05/03/15 0925 05/04/15 1030  NA 140  --  141 139 135  K 5.0  --  4.9 4.5 4.4  CL 100*  --  101 101 98*  CO2 27  --  28 26 24   GLUCOSE  86  --  78 85 93  BUN 44*  --  52* 35* 47*  CREATININE 15.93* 16.82* 17.50* 13.62* 16.39*  CALCIUM 9.1  --  8.7* 8.4* 7.9*  PHOS  --   --   --   --  7.2*   Liver Function Tests:  Recent Labs Lab 05/02/15 0608 05/04/15 1030  AST 17  --   ALT 14*  --   ALKPHOS 38  --   BILITOT 0.9  --   PROT 6.4*  --   ALBUMIN 3.3* 3.1*   No results for input(s): LIPASE, AMYLASE in the last 168 hours. No results for input(s): AMMONIA in the last 168 hours. CBC:  Recent Labs Lab 05/01/15 1907 05/02/15 0034 05/02/15 0608 05/04/15 1030  WBC 6.4 6.4 6.7 5.2  NEUTROABS  --   --  4.5 3.1  HGB 11.1* 10.8* 10.6* 10.3*  HCT 34.9* 34.0* 33.2* 31.3*    MCV 92.3 92.4 92.5 91.8  PLT 125* 114* 118* 110*   Cardiac Enzymes:  Recent Labs Lab 05/02/15 0034 05/02/15 0608 05/02/15 1200  TROPONINI 0.15* 0.13* 0.11*   BNP (last 3 results) No results for input(s): PROBNP in the last 8760 hours. CBG: No results for input(s): GLUCAP in the last 168 hours.  Recent Results (from the past 240 hour(s))  MRSA PCR Screening     Status: None   Collection Time: 05/02/15 12:39 AM  Result Value Ref Range Status   MRSA by PCR NEGATIVE NEGATIVE Final    Comment:        The GeneXpert MRSA Assay (FDA approved for NASAL specimens only), is one component of a comprehensive MRSA colonization surveillance program. It is not intended to diagnose MRSA infection nor to guide or monitor treatment for MRSA infections.            Studies: Nm Myocar Multi W/spect W/wall Motion / Ef  05/03/2015   CLINICAL DATA:  59 year old with chest pain.  EXAM: MYOCARDIAL IMAGING WITH SPECT (REST AND PHARMACOLOGIC-STRESS)  GATED LEFT VENTRICULAR WALL MOTION STUDY  LEFT VENTRICULAR EJECTION FRACTION  TECHNIQUE: Standard myocardial SPECT imaging was performed after resting intravenous injection of 10 mCi Tc-53m sestamibi. Subsequently, intravenous infusion of Lexiscan was performed under the supervision of the Cardiology staff. At peak effect of the drug, 30 mCi Tc-60m sestamibi was injected intravenously and standard myocardial SPECT imaging was performed. Quantitative gated imaging was also performed to evaluate left ventricular wall motion, and estimate left ventricular ejection fraction.  COMPARISON:  05/30/2013  FINDINGS: Perfusion: Again noted is left ventricular dilatation. There is concern for mild reversibility along the anterolateral wall near the base. No other areas are concerning for reversibility or infarct.  Wall Motion: Diffuse mild hypokinesia without focal abnormality.  Left Ventricular Ejection Fraction: 46 % (previously measured 46%).  End diastolic  volume 601 ml  End systolic volume 150 ml  IMPRESSION: 1. Small area of reversibility along the base of the anterolateral wall. This finding is equivocal for pharmacologically induced ischemia based on the location and size. No other areas are concerning for ischemia or infarct.\  2. Left ventricular dilatation with mild hypokinesia.  3. Left ventricular ejection fraction is 46% and stable.  4. Intermediate-risk stress test findings*.  *2012 Appropriate Use Criteria for Coronary Revascularization Focused Update: J Am Coll Cardiol. 2012;59(9):857-881. http://content.dementiazones.com.aspx?articleid=1201161   Electronically Signed   By: Richarda Overlie M.D.   On: 05/03/2015 13:49        Scheduled Meds: . amLODipine  10 mg  Oral QHS  . aspirin  81 mg Oral Pre-Cath  . aspirin EC  325 mg Oral Daily  . atorvastatin  20 mg Oral q1800  . calcitRIOL  2.25 mcg Oral Q M,W,F-HD  . [START ON 05/06/2015] darbepoetin (ARANESP) injection - DIALYSIS  40 mcg Intravenous Q Fri-HD  . enoxaparin (LOVENOX) injection  30 mg Subcutaneous Daily  . ferric gluconate (FERRLECIT/NULECIT) IV  62.5 mg Intravenous Q M,W,F-HD  . labetalol  200 mg Oral QHS  . losartan  100 mg Oral QHS  . multivitamin  1 tablet Oral QHS  . sodium chloride  3 mL Intravenous Q12H  . sodium chloride  3 mL Intravenous Q12H   Continuous Infusions: . sodium chloride    . sodium chloride 10 mL/hr (05/05/15 1103)    Principal Problem:   Acute respiratory failure with hypoxia (HCC) Active Problems:   End stage renal disease (HCC)   Pulmonary edema   Hypertensive urgency   Chronic anemia    Time spent: 25 minutes    Holbert Caples, MD, FACP, FHM. [facility] Pager [phone]  If 7PM-7AM, please contact night-coverage [email or web address] Password [email or web address] 05/05/2015, 11:18 AM    LOS: 4 days

## 2015-05-05 NOTE — Progress Notes (Signed)
Subjective:   No complaints, denies chest pain, had a goodnight  Objective Filed Vitals:   05/04/15 1418 05/04/15 1500 05/04/15 2041 05/05/15 0503  BP: 152/86 169/81 185/91 180/93  Pulse: 79 83 76 79  Temp: 98.1 F (36.7 C) 98.8 F (37.1 C) 98.5 F (36.9 C) 98 F (36.7 C)  TempSrc: Oral Oral Oral Oral  Resp: 18 20 18 20   Height:      Weight: 99.1 kg (218 lb 7.6 oz)  99.14 kg (218 lb 9 oz)   SpO2: 100% 100% 100% 100%   Physical Exam General: alert and oriented. No acute distress Heart: RRR  Lungs: CTA, unlabored  Abdomen: soft, nontender +BS  Extremities: no edema Dialysis Access: L AVF +b/t    Dialysis Orders: [facility] 4 hr 15 mins 101.5kgs 2K/2Ca+ Heparin 3500 Venofer 100 x10- stop date 10/24 Calcitriol 2.25   Assessment/Plan: 1. SOB- reports improved after HD Friday. No evidence of PE. Pulm edema on xray- cont volume removal as tolerated 2. Chest pain- cardiology following. Stress test yesterday. EF 45%. For cath today 3. ESRD - MWF [place], HD tomorrow 4. Hypertension/volume - edw lowered 1 kg last week- cont to lower edw as tolerated. Home meds amlodpine, carvedilol, losartan 5. Anemia - hgb 10.3- slow trend down- start low dose ESA, cont Fe bolus. Last tsat 27 6. Metabolic bone disease - Cont binder and calcitriol. Last pth 173 and pthos 3.7 7. Nutrition - renal diet. Vitamin. Alb 3.3  Shelle Iron, NP [place] [phone] 05/05/2015,9:16 AM  LOS: 57 days   Pt seen, examined and agree w A/P as above. On admission had pulm edema by CT/ cxr at 102 kg, 99kg now.  Need to get vol down further w HD. BP's still running high.  Kelly Splinter MD pager [phone]    cell [phone] 05/05/2015, 12:13 PM    Additional Objective Labs: Basic Metabolic Panel:  Recent Labs Lab 05/02/15 0608 05/03/15 0925 05/04/15 1030  NA 141 139 135  K 4.9 4.5 4.4  CL 101 101 98*  CO2 28 26 24   GLUCOSE 78 85 93  BUN 52* 35* 47*  CREATININE  17.50* 13.62* 16.39*  CALCIUM 8.7* 8.4* 7.9*  PHOS  --   --  7.2*   Liver Function Tests:  Recent Labs Lab 05/02/15 0608 05/04/15 1030  AST 17  --   ALT 14*  --   ALKPHOS 38  --   BILITOT 0.9  --   PROT 6.4*  --   ALBUMIN 3.3* 3.1*   No results for input(s): LIPASE, AMYLASE in the last 168 hours. CBC:  Recent Labs Lab 05/01/15 1907 05/02/15 0034 05/02/15 0608 05/04/15 1030  WBC 6.4 6.4 6.7 5.2  NEUTROABS  --   --  4.5 3.1  HGB 11.1* 10.8* 10.6* 10.3*  HCT 34.9* 34.0* 33.2* 31.3*  MCV 92.3 92.4 92.5 91.8  PLT 125* 114* 118* 110*   Blood Culture    Component Value Date/Time   SDES URINE, CLEAN CATCH 07/04/2010 0532   SPECREQUEST NONE 07/04/2010 0532   CULT NO GROWTH 07/04/2010 0532   REPTSTATUS 07/05/2010 FINAL 07/04/2010 0532    Cardiac Enzymes:  Recent Labs Lab 05/02/15 0034 05/02/15 0608 05/02/15 1200  TROPONINI 0.15* 0.13* 0.11*   CBG: No results for input(s): GLUCAP in the last 168 hours. Iron Studies: No results for input(s): IRON, TIBC, TRANSFERRIN, FERRITIN in the last 72 hours. @lablastinr3 @ Studies/Results: Nm Myocar Multi W/spect W/wall Motion / Ef  05/03/2015  CLINICAL DATA:  59 year old with chest pain.  EXAM: MYOCARDIAL IMAGING WITH SPECT (REST AND PHARMACOLOGIC-STRESS)  GATED LEFT VENTRICULAR WALL MOTION STUDY  LEFT VENTRICULAR EJECTION FRACTION  TECHNIQUE: Standard myocardial SPECT imaging was performed after resting intravenous injection of 10 mCi Tc-58m sestamibi. Subsequently, intravenous infusion of Lexiscan was performed under the supervision of the Cardiology staff. At peak effect of the drug, 30 mCi Tc-47m sestamibi was injected intravenously and standard myocardial SPECT imaging was performed. Quantitative gated imaging was also performed to evaluate left ventricular wall motion, and estimate left ventricular ejection fraction.  COMPARISON:  05/30/2013  FINDINGS: Perfusion: Again noted is left ventricular dilatation. There is concern for  mild reversibility along the anterolateral wall near the base. No other areas are concerning for reversibility or infarct.  Wall Motion: Diffuse mild hypokinesia without focal abnormality.  Left Ventricular Ejection Fraction: 46 % (previously measured 46%).  End diastolic volume [id number]  End systolic volume [id number] ml  IMPRESSION: 1. Small area of reversibility along the base of the anterolateral wall. This finding is equivocal for pharmacologically induced ischemia based on the location and size. No other areas are concerning for ischemia or infarct.\  2. Left ventricular dilatation with mild hypokinesia.  3. Left ventricular ejection fraction is 46% and stable.  4. Intermediate-risk stress test findings*.  *2012 Appropriate Use Criteria for Coronary Revascularization Focused Update: J Am Coll Cardiol. [id number]. http://content.airportbarriers.com.aspx?articleid=1201161   Electronically Signed   By: Markus Daft M.D.   On: 05/03/2015 13:49   Medications: . sodium chloride     . amLODipine  10 mg Oral QHS  . aspirin  81 mg Oral Pre-Cath  . aspirin EC  325 mg Oral Daily  . atorvastatin  20 mg Oral q1800  . calcitRIOL  2.25 mcg Oral Q M,W,F-HD  . enoxaparin (LOVENOX) injection  30 mg Subcutaneous Daily  . ferric gluconate (FERRLECIT/NULECIT) IV  62.5 mg Intravenous Q M,W,F-HD  . Influenza vac split quadrivalent PF  0.5 mL Intramuscular Tomorrow-1000  . labetalol  200 mg Oral QHS  . losartan  100 mg Oral QHS  . multivitamin  1 tablet Oral QHS  . sodium chloride  3 mL Intravenous Q12H  . sodium chloride  3 mL Intravenous Q12H

## 2015-05-05 NOTE — Progress Notes (Signed)
Pt arrived back on unit from Cath lab. Pt A&OX4. No signs of distress noted. R groin site has gauze dressing; which is clean dry and intact. Pt currently on bed rest. Will continue to assess and monitor pt.    Carole Civil, RN

## 2015-05-05 NOTE — Interval H&P Note (Signed)
Cath Lab Visit (complete for each Cath Lab visit)  Clinical Evaluation Leading to the Procedure:    ACS: No.  Non-ACS:    Anginal Classification: CCS IV  Anti-ischemic medical therapy: Maximal Therapy (2 or more classes of medications)  Non-Invasive Test Results: Intermediate-risk stress test findings: cardiac mortality 1-3%/year  Prior CABG: No previous CABG      History and Physical Interval Note:  05/05/2015 10:52 AM  Jon [name]  has presented today for surgery, with the diagnosis of unstable angina  The various methods of treatment have been discussed with the patient and family. After consideration of risks, benefits and other options for treatment, the patient has consented to  Procedure(s): Left Heart Cath and Coronary Angiography (N/A) as a surgical intervention .  The patient's history has been reviewed, patient examined, no change in status, stable for surgery.  I have reviewed the patient's chart and labs.  Questions were answered to the patient's satisfaction.     Charolette Forward

## 2015-05-06 DIAGNOSIS — R079 Chest pain, unspecified: Secondary | ICD-10-CM

## 2015-05-06 LAB — RENAL FUNCTION PANEL
ALBUMIN: 3.2 g/dL — AB (ref 3.5–5.0)
ANION GAP: 15 (ref 5–15)
BUN: 42 mg/dL — ABNORMAL HIGH (ref 6–20)
CHLORIDE: 94 mmol/L — AB (ref 101–111)
CO2: 25 mmol/L (ref 22–32)
Calcium: 8.3 mg/dL — ABNORMAL LOW (ref 8.9–10.3)
Creatinine, Ser: 15.72 mg/dL — ABNORMAL HIGH (ref 0.61–1.24)
GFR calc Af Amer: 3 mL/min — ABNORMAL LOW (ref >60)
GFR calc non Af Amer: 3 mL/min — ABNORMAL LOW (ref >60)
GLUCOSE: 106 mg/dL — AB (ref 65–99)
PHOSPHORUS: 6.3 mg/dL — AB (ref 2.5–4.6)
POTASSIUM: 4.2 mmol/L (ref 3.5–5.1)
Sodium: 134 mmol/L — ABNORMAL LOW (ref 135–145)

## 2015-05-06 LAB — CBC
HEMATOCRIT: 32.5 % — AB (ref 39.0–52.0)
HEMOGLOBIN: 10.4 g/dL — AB (ref 13.0–17.0)
MCH: 29.3 pg (ref 26.0–34.0)
MCHC: 32 g/dL (ref 30.0–36.0)
MCV: 91.5 fL (ref 78.0–100.0)
Platelets: 123 10*3/uL — ABNORMAL LOW (ref 150–400)
RBC: 3.55 MIL/uL — ABNORMAL LOW (ref 4.22–5.81)
RDW: 14.5 % (ref 11.5–15.5)
WBC: 5.7 10*3/uL (ref 4.0–10.5)

## 2015-05-06 MED ORDER — HEPARIN SODIUM (PORCINE) 1000 UNIT/ML DIALYSIS
3500.0000 [IU] | Freq: Once | INTRAMUSCULAR | Status: AC
  Administered 2015-05-06: 3500 [IU] via INTRAVENOUS_CENTRAL

## 2015-05-06 MED ORDER — PENTAFLUOROPROP-TETRAFLUOROETH EX AERO: 1.0000 "application " | INHALATION_SPRAY | CUTANEOUS | Status: DC | PRN

## 2015-05-06 MED ORDER — DARBEPOETIN ALFA 40 MCG/0.4ML IJ SOSY
PREFILLED_SYRINGE | INTRAMUSCULAR | Status: AC
  Filled 2015-05-06: qty 0.4

## 2015-05-06 MED ORDER — LOSARTAN POTASSIUM 100 MG PO TABS: 100.0000 mg | ORAL_TABLET | Freq: Every day | ORAL | Status: DC

## 2015-05-06 MED ORDER — LIDOCAINE-PRILOCAINE 2.5-2.5 % EX CREA: 1.0000 "application " | TOPICAL_CREAM | CUTANEOUS | Status: DC | PRN

## 2015-05-06 MED ORDER — SODIUM CHLORIDE 0.9 % IV SOLN: 100.0000 mL | INTRAVENOUS | Status: DC | PRN

## 2015-05-06 MED ORDER — ALTEPLASE 2 MG IJ SOLR
2.0000 mg | Freq: Once | INTRAMUSCULAR | Status: DC | PRN
  Filled 2015-05-06: qty 2

## 2015-05-06 MED ORDER — LABETALOL HCL 200 MG PO TABS: 200.0000 mg | ORAL_TABLET | Freq: Two times a day (BID) | ORAL | Status: DC

## 2015-05-06 MED ORDER — DARBEPOETIN ALFA 60 MCG/0.3ML IJ SOSY
PREFILLED_SYRINGE | INTRAMUSCULAR | Status: AC
  Filled 2015-05-06: qty 0.3

## 2015-05-06 MED ORDER — HEPARIN SODIUM (PORCINE) 1000 UNIT/ML DIALYSIS: 1000.0000 [IU] | INTRAMUSCULAR | Status: DC | PRN

## 2015-05-06 MED ORDER — LIDOCAINE HCL (PF) 1 % IJ SOLN: 5.0000 mL | INTRAMUSCULAR | Status: DC | PRN

## 2015-05-06 NOTE — Procedures (Signed)
Heart cath negative.  Vol excess playing a role in pt's symptoms I suspect. Max UF w HD today. OK for dc after HD today, have d/w primary.  Encouraged fluid discretion w patient. Continue to challenge dry wt in OP setting.    I was present at this dialysis session, have reviewed the session itself and made  appropriate changes Kelly Splinter MD (pgr) [phone]    ([phone] 05/06/2015, 12:34 PM

## 2015-05-06 NOTE — Discharge Summary (Signed)
Physician Discharge Summary  Jon [name] [id number] DOB: Oct 09, 1955 DOA: 05/01/2015  PCP: Theressa Millard, MD  Admit date: 05/01/2015 Discharge date: 05/06/2015  Time spent: Greater than 30 minutes  Recommendations for Outpatient Follow-up:  1. Dr. Jana Hakim, PCP in 1 week 2. Hemodialysis Center: Keep regular hemodialysis appointments for Mondays, Wednesdays and Fridays 3. Vascular and Vein specialists: Has prior appointment on 05/25/15 at 3:30 PM.  Discharge Diagnoses:  Principal Problem:   Acute respiratory failure with hypoxia ([place]) Active Problems:   End stage renal disease ([place])   Pulmonary edema   Hypertensive urgency   Chronic anemia   Discharge Condition: Improved & Stable  Diet recommendation: Heart healthy diet.  Filed Weights   05/05/15 2119 05/06/15 0715 05/06/15 1134  Weight: 104.509 kg (230 lb 6.4 oz) 102.9 kg (226 lb 13.7 oz) 98.7 kg (217 lb 9.5 oz)    History of present illness:  59 year old male , PMH of ESRD on MWF HD, HTN, chronic anemia, GERD, was admitted to [facility] on 05/01/15 with complaints of chest pain and dyspnea and was admitted for evaluation and management of acute respiratory failure with hypoxia and chest pain. CTA chest negative for PE or dissection. troponins elevated. Cardiology consulted and stress test was mildly positive and showed a small area of reversible ischemia in the anterolateral wall at the base with EF of 46%. Cardiology did cath on 10/6 with findings as below and have cleared him for discharge today. Nephrology consulted for dialysis needs.  Hospital Course:   Atypical chest pain with minimally elevated troponin - Peak troponin of 0.15. Flat trend. - Has history of CAD and MI in the past. - CTA chest : No evidence of aortic dissection, aneurysmal dilatation or PE but did show findings compatible with pulmonary edema, cardiomegaly, scattered coronary artery calcifications - Mildly positive nuclear stress test.  Cardiology performed cardiac cath with findings as indicated below - No further chest pain reported. - Cardiology has seen him today and cleared for discharge home.   Acute respiratory failure with hypoxia - Possibly related to volume overload from ESRD and uncontrolled hypertension - Resolved  ESRD - Follow G following and patient on MWF dialysis - Underwent dialysis 10/7. - Discussed with nephrology who have cleared him for discharge home.  Anemia in chronic kidney disease - Stable  Thrombocytopenia - Unclear etiology but stable  Hypertensive urgency - Continue losartan. Labetalol dose was increased. Improved control. Outpatient follow-up. - Echo shows severe LVH. Aim to improve outpatient blood pressure control.   Consultants:  Nephrology  Cardiology  Procedures:  Hemodialysis  Cardiac cath 05/05/15: Right dominance. Proximal LAD to mid LAD lesion, 10% stenosed. LV systolic function is normal. LVEF 55-65 percent by visual estimate. There are no wall motion abnormalities in the left LV.  2-D echo 05/03/15: Study Conclusions  - Left ventricle: The cavity size was normal. There was severe concentric hypertrophy. The appearance was consistent with hypertrophic cardiomyopathy. Systolic function was vigorous. The estimated ejection fraction was in the range of 65% to 70%. There was no dynamic obstruction. Wall motion was normal; there were no regional wall motion abnormalities. Features are consistent with a pseudonormal left ventricular filling pattern, with concomitant abnormal relaxation and increased filling pressure (grade 2 diastolic dysfunction). - Aortic valve: Trileaflet; mildly thickened, mildly calcified leaflets. Transvalvular velocity was minimally increased. There was no stenosis. There was trivial regurgitation. - Mitral valve: Calcified annulus. There was mild regurgitation. - Left atrium: The atrium was moderately  dilated. - Right atrium:  The atrium was mildly dilated.  Antibiotics:  none   Discharge Exam:  Complaints:  Denies complaints. No chest pain. Seen at dialysis this morning.  Filed Vitals:   05/06/15 1100 05/06/15 1134 05/06/15 1206 05/06/15 1250  BP: 154/87 145/82 161/95 149/73  Pulse: 78 78 82 75  Temp:  98 F (36.7 C) 98.6 F (37 C) 98.9 F (37.2 C)  TempSrc:  Oral Oral Oral  Resp: 17 15 16 17   Height:      Weight:  98.7 kg (217 lb 9.5 oz)    SpO2:  99% 99% 99%    General exam: Pleasant middle-aged male lying comfortably in bed Respiratory system: Clear. No increased work of breathing. Cardiovascular system: S1 & S2 heard, RRR. No JVD, murmurs, gallops, clicks or pedal edema. Non tele Gastrointestinal system: Abdomen is nondistended, soft and nontender. Normal bowel sounds heard. Central nervous system: Alert and oriented. No focal neurological deficits. Extremities: Symmetric 5 x 5 power.  Discharge Instructions      Discharge Instructions    (HEART FAILURE PATIENTS) Call MD:  Anytime you have any of the following symptoms: 1) 3 pound weight gain in 24 hours or 5 pounds in 1 week 2) shortness of breath, with or without a dry hacking cough 3) swelling in the hands, feet or stomach 4) if you have to sleep on extra pillows at night in order to breathe.    Complete by:  As directed      Call MD for:  difficulty breathing, headache or visual disturbances    Complete by:  As directed      Call MD for:  extreme fatigue    Complete by:  As directed      Call MD for:  hives    Complete by:  As directed      Call MD for:  persistant dizziness or light-headedness    Complete by:  As directed      Call MD for:  persistant nausea and vomiting    Complete by:  As directed      Call MD for:  severe uncontrolled pain    Complete by:  As directed      Call MD for:  temperature >100.4    Complete by:  As directed      Diet - low sodium heart healthy    Complete by:  As  directed      Increase activity slowly    Complete by:  As directed             Medication List    TAKE these medications        amLODipine 10 MG tablet  Commonly known as:  NORVASC  Take 10 mg by mouth at bedtime.     aspirin 81 MG EC tablet  Take 1 tablet (81 mg total) by mouth daily.     atorvastatin 20 MG tablet  Commonly known as:  LIPITOR  Take 1 tablet (20 mg total) by mouth daily at 6 PM.     labetalol 200 MG tablet  Commonly known as:  NORMODYNE  Take 1 tablet (200 mg total) by mouth 2 (two) times daily.     losartan 100 MG tablet  Commonly known as:  COZAAR  Take 1 tablet (100 mg total) by mouth at bedtime.     multivitamin Tabs tablet  Take 1 tablet by mouth at bedtime.     nitroGLYCERIN 0.4 MG SL tablet  Commonly known as:  NITROSTAT  Place 1 tablet (0.4 mg total) under the tongue every 5 (five) minutes x 3 doses as needed for chest pain.     oxyCODONE 5 MG immediate release tablet  Commonly known as:  ROXICODONE  Take 1 tablet (5 mg total) by mouth every 6 (six) hours as needed.     spironolactone 25 MG tablet  Commonly known as:  ALDACTONE  Take 25 mg by mouth daily.       Follow-up Information    Follow up with Theressa Millard, MD. Schedule an appointment as soon as possible for a visit in 1 week.   Specialty:  Internal Medicine   Contact information:   [id number] [place] [address] [phone]       Follow up with Hemodialysis Center.   Why:  Keep regular dialysis appointments on Mons/Weds/Fri's       The results of significant diagnostics from this hospitalization (including imaging, microbiology, ancillary and laboratory) are listed below for reference.    Significant Diagnostic Studies: Dg Chest 2 View  05/01/2015   CLINICAL DATA:  Shortness of breath, all worsening.  EXAM: CHEST  2 VIEW  COMPARISON:  05/29/2013  FINDINGS: There is bilateral diffuse interstitial thickening. There is no pleural effusion or  pneumothorax. There is stable cardiomegaly.  The osseous structures are unremarkable.  IMPRESSION: Findings most concerning for mild pulmonary edema.   Electronically Signed   By: Kathreen Devoid   On: 05/01/2015 20:04   Nm Myocar Multi W/spect W/wall Motion / Ef  05/03/2015   CLINICAL DATA:  59 year old with chest pain.  EXAM: MYOCARDIAL IMAGING WITH SPECT (REST AND PHARMACOLOGIC-STRESS)  GATED LEFT VENTRICULAR WALL MOTION STUDY  LEFT VENTRICULAR EJECTION FRACTION  TECHNIQUE: Standard myocardial SPECT imaging was performed after resting intravenous injection of 10 mCi Tc-28m sestamibi. Subsequently, intravenous infusion of Lexiscan was performed under the supervision of the Cardiology staff. At peak effect of the drug, 30 mCi Tc-84m sestamibi was injected intravenously and standard myocardial SPECT imaging was performed. Quantitative gated imaging was also performed to evaluate left ventricular wall motion, and estimate left ventricular ejection fraction.  COMPARISON:  05/30/2013  FINDINGS: Perfusion: Again noted is left ventricular dilatation. There is concern for mild reversibility along the anterolateral wall near the base. No other areas are concerning for reversibility or infarct.  Wall Motion: Diffuse mild hypokinesia without focal abnormality.  Left Ventricular Ejection Fraction: 46 % (previously measured 46%).  End diastolic volume [id number]  End systolic volume [id number] ml  IMPRESSION: 1. Small area of reversibility along the base of the anterolateral wall. This finding is equivocal for pharmacologically induced ischemia based on the location and size. No other areas are concerning for ischemia or infarct.\  2. Left ventricular dilatation with mild hypokinesia.  3. Left ventricular ejection fraction is 46% and stable.  4. Intermediate-risk stress test findings*.  *2012 Appropriate Use Criteria for Coronary Revascularization Focused Update: J Am Coll Cardiol. N6492421.  http://content.airportbarriers.com.aspx?articleid=1201161   Electronically Signed   By: Markus Daft M.D.   On: 05/03/2015 13:49   Ct Angio Chest Aorta W/cm &/or Wo/cm  05/01/2015   CLINICAL DATA:  Acute onset of generalized chest pain. Concern for thoracic aortic dissection. Initial encounter.  EXAM: CT ANGIOGRAPHY CHEST WITH CONTRAST  TECHNIQUE: Multidetector CT imaging of the chest was performed using the standard protocol during bolus administration of intravenous contrast. Multiplanar CT image reconstructions and MIPs were obtained to evaluate the vascular anatomy.  CONTRAST:  162mL OMNIPAQUE IOHEXOL 350  MG/ML SOLN  COMPARISON:  Chest radiograph performed earlier today at 7:21 p.m.  FINDINGS: There is no evidence of aortic dissection. There is no evidence of aneurysmal dilatation. Minimal calcification is noted along the aortic arch. No mural thrombus is seen. There is no evidence of luminal narrowing.  There is no evidence of central pulmonary embolus.  Trace bilateral pleural effusions are noted. Hazy diffuse bilateral airspace opacification is seen, with underlying interstitial prominence, compatible with pulmonary edema. There is no evidence of pneumothorax. No masses are identified; no abnormal focal contrast enhancement is seen.  The heart is enlarged. No significant pericardial effusion is seen. No mediastinal lymphadenopathy is appreciated. Mild calcification is noted at the mitral and aortic valves. Scattered coronary artery calcifications are seen. The great vessels are grossly unremarkable in appearance. No axillary lymphadenopathy is seen. The visualized portions of the thyroid gland are unremarkable in appearance.  The visualized portions of the liver and spleen are unremarkable. The kidneys are diffusely atrophic, with numerous cysts of varying size, reflecting underlying chronic renal disease. The visualized portions of the gallbladder, pancreas and adrenal glands are grossly unremarkable.   No acute osseous abnormalities are seen.  Review of the MIP images confirms the above findings.  IMPRESSION: 1. No evidence of aortic dissection. No evidence of aneurysmal dilatation. No luminal narrowing seen. Minimal calcification noted along the aortic arch. 2. No evidence of central pulmonary embolus. 3. Hazy diffuse bilateral airspace opacification, with underlying interstitial prominence and trace bilateral pleural effusions, compatible with pulmonary edema. 4. Cardiomegaly noted. 5. Scattered coronary artery calcifications seen. Mild calcification at the mitral and aortic valves. 6. Diffuse renal atrophy and scattered renal cysts noted, reflecting chronic renal disease.   Electronically Signed   By: Garald Balding M.D.   On: 05/01/2015 23:58    Microbiology: Recent Results (from the past 240 hour(s))  MRSA PCR Screening     Status: None   Collection Time: 05/02/15 12:39 AM  Result Value Ref Range Status   MRSA by PCR NEGATIVE NEGATIVE Final    Comment:        The GeneXpert MRSA Assay (FDA approved for NASAL specimens only), is one component of a comprehensive MRSA colonization surveillance program. It is not intended to diagnose MRSA infection nor to guide or monitor treatment for MRSA infections.      Labs: Basic Metabolic Panel:  Recent Labs Lab 05/01/15 1907 05/02/15 0034 05/02/15 [id number] 05/03/15 0925 05/04/15 1030 05/06/15 0754  NA 140  --  141 139 135 134*  K 5.0  --  4.9 4.5 4.4 4.2  CL 100*  --  101 101 98* 94*  CO2 27  --  28 26 24 25   GLUCOSE 86  --  78 85 93 106*  BUN 44*  --  52* 35* 47* 42*  CREATININE 15.93* 16.82* 17.50* 13.62* 16.39* 15.72*  CALCIUM 9.1  --  8.7* 8.4* 7.9* 8.3*  PHOS  --   --   --   --  7.2* 6.3*   Liver Function Tests:  Recent Labs Lab 05/02/15 0608 05/04/15 1030 05/06/15 0754  AST 17  --   --   ALT 14*  --   --   ALKPHOS 38  --   --   BILITOT 0.9  --   --   PROT 6.4*  --   --   ALBUMIN 3.3* 3.1* 3.2*   No results for  input(s): LIPASE, AMYLASE in the last 168 hours. No results for input(s): AMMONIA  in the last 168 hours. CBC:  Recent Labs Lab 05/01/15 1907 05/02/15 0034 05/02/15 0608 05/04/15 1030 05/06/15 0754  WBC 6.4 6.4 6.7 5.2 5.7  NEUTROABS  --   --  4.5 3.1  --   HGB 11.1* 10.8* 10.6* 10.3* 10.4*  HCT 34.9* 34.0* 33.2* 31.3* 32.5*  MCV 92.3 92.4 92.5 91.8 91.5  PLT 125* 114* 118* 110* 123*   Cardiac Enzymes:  Recent Labs Lab 05/02/15 0034 05/02/15 0608 05/02/15 1200  TROPONINI 0.15* 0.13* 0.11*   BNP: BNP (last 3 results) No results for input(s): BNP in the last 8760 hours.  ProBNP (last 3 results) No results for input(s): PROBNP in the last 8760 hours.  CBG: No results for input(s): GLUCAP in the last 168 hours.      Signed:  Vernell Leep, MD, FACP, FHM. [facility] Pager [phone]  If 7PM-7AM, please contact night-coverage [email or web address] Password [email or web address] 05/06/2015, 4:20 PM

## 2015-05-06 NOTE — Progress Notes (Signed)
Patient Discharge:  Disposition: Pt discharged home to self  Education: Pt educated on medications, follow up appointments and all discharge instructions. Pt verbalized understanding.  IV: Removed  Telemetry: Removed. CCMD notified  Follow-up appointments: Reviewed with pt  Prescriptions: Sent to pt's pharmacy  Transportation: Transported home by family  Belongings:All belongings taken with pt.

## 2015-05-06 NOTE — Progress Notes (Signed)
Subjective:  Denies any chest pain or shortness of breath.  Ambulating in room without any problems.  Tolerated left cardiac catheterization yesterday, noted to have mild nonobstructive CAD.  Objective:  Vital Signs in the last 24 hours: Temp:  [97.9 F (36.6 C)-98.9 F (37.2 C)] 98.9 F (37.2 C) (10/07 1250) Pulse Rate:  [75-88] 75 (10/07 1250) Resp:  [14-20] 17 (10/07 1250) BP: (145-197)/(71-106) 149/73 mmHg (10/07 1250) SpO2:  [98 %-100 %] 99 % (10/07 1250) Weight:  [98.7 kg (217 lb 9.5 oz)-104.509 kg (230 lb 6.4 oz)] 98.7 kg (217 lb 9.5 oz) (10/07 1134)  Intake/Output from previous day: 10/06 0701 - 10/07 0700 In: 660 [P.O.:600; I.V.:60] Out: -  Intake/Output from this shift: Total I/O In: -  Out: [id number] [Other:4155]  Physical Exam: Neck: no adenopathy, no carotid bruit, no JVD and supple, symmetrical, trachea midline Lungs: clear to auscultation bilaterally Heart: regular rate and rhythm, S1, S2 normal and soft systolic murmur noted Abdomen: soft, non-tender; bowel sounds normal; no masses,  no organomegaly Extremities: extremities normal, atraumatic, no cyanosis or edema and right groin dressing dry  Lab Results:  Recent Labs  05/04/15 1030 05/06/15 0754  WBC 5.2 5.7  HGB 10.3* 10.4*  PLT 110* 123*    Recent Labs  05/04/15 1030 05/06/15 0754  NA 135 134*  K 4.4 4.2  CL 98* 94*  CO2 24 25  GLUCOSE 93 106*  BUN 47* 42*  CREATININE 16.39* 15.72*   No results for input(s): TROPONINI in the last 72 hours.  Invalid input(s): CK, MB Hepatic Function Panel  Recent Labs  05/06/15 0754  ALBUMIN 3.2*   No results for input(s): CHOL in the last 72 hours. No results for input(s): PROTIME in the last 72 hours.  Imaging: Imaging results have been reviewed and No results found.  Cardiac Studies:  Assessment/Plan:  Atypical chest pain with minimally elevated troponin. Doubt significant [email or web address] positive nuclear stress teststatus post left catheter. Mild  coronary artery disease. Status postAcute pulmonary edema secondary to volume overload.rule out ischemia Coronary artery disease, history of MI in the past. Hypertension. End-stage renal disease on hemodialysis. Anemia of chronic disease. Hyperlipidemia. GERD. Status post hypertensive urgency Plan Okay to discharge from cardiac point of view. Postcardiac catheter instructions have been given. Follow-up with me in one week  LOS: 5 days    Charolette Forward 05/06/2015, 1:46 PM

## 2015-05-06 NOTE — Discharge Instructions (Signed)
Angina Pectoris Angina pectoris, often called angina, is extreme discomfort in the chest, neck, or arm. This is caused by a lack of blood in the middle and thickest layer of the heart wall (myocardium). There are four types of angina:  Stable angina. Stable angina usually occurs in episodes of predictable frequency and duration. It is usually brought on by physical activity, stress, or excitement. Stable angina usually lasts a few minutes and can often be relieved by a medicine that you place under your tongue. This medicine is called sublingual nitroglycerin.  Unstable angina. Unstable angina can occur even when you are doing little or no physical activity. It can even occur while you are sleeping or when you are at rest. It can suddenly increase in severity or frequency. It may not be relieved by sublingual nitroglycerin, and it can last up to 30 minutes.  Microvascular angina. This type of angina is caused by a disorder of tiny blood vessels called arterioles. Microvascular angina is more common in women. The pain may be more severe and last longer than other types of angina pectoris.  Prinzmetal or variant angina. This type of angina pectoris is rare and usually occurs when you are doing little or no physical activity. It especially occurs in the early morning hours. CAUSES Atherosclerosis is the cause of angina. This is the buildup of fat and cholesterol (plaque) on the inside of the arteries. Over time, the plaque may narrow or block the artery, and this will lessen blood flow to the heart. Plaque can also become weak and break off within a coronary artery to form a clot and cause a sudden blockage. RISK FACTORS Risk factors common to both men and women include:  High cholesterol levels.  High blood pressure (hypertension).  Tobacco use.  Diabetes.  Family history of angina.  Obesity.  Lack of exercise.  A diet high in saturated fats. Women are at greater risk for angina if they  are:  Over age 63.  Postmenopausal. SYMPTOMS Many people do not experience any symptoms during the early stages of angina. As the condition progresses, symptoms common to both men and women may include:  Chest pain.  The pain can be described as a crushing or squeezing in the chest, or a tightness, pressure, fullness, or heaviness in the chest.  The pain can last more than a few minutes, or it can stop and recur.  Pain in the arms, neck, jaw, or back.  Unexplained heartburn or indigestion.  Shortness of breath.  Nausea.  Sudden cold sweats.  Sudden light-headedness. Many women have chest discomfort and some of the other symptoms. However, women often have different (atypical) symptoms, such as:   Fatigue.  Unexplained feelings of nervousness or anxiety.  Unexplained weakness.  Dizziness or fainting. Sometimes, women may have angina without any symptoms. DIAGNOSIS  Tests to diagnose angina may include:  ECG (electrocardiogram).  Exercise stress test. This looks for signs of blockage when the heart is being exercised.  Pharmacologic stress test. This test looks for signs of blockage when the heart is being stressed with a medicine.  Blood tests.  Coronary angiogram. This is a procedure to look at the coronary arteries to see if there is any blockage. TREATMENT  The treatment of angina may include the following:  Healthy behavioral changes to reduce or control risk factors.  Medicine.  Coronary stenting.A stent helps to keep an artery open.  Coronary angioplasty. This procedure widens a narrowed or blocked artery.  Coronary arterybypass  surgery. This will allow your blood to pass the blockage (bypass) to reach your heart. HOME CARE INSTRUCTIONS   Take medicines only as directed by your health care provider.  Do not take the following medicines unless your health care provider approves:  Nonsteroidal anti-inflammatory drugs (NSAIDs), such as ibuprofen,  naproxen, or celecoxib.  Vitamin supplements that contain vitamin A, vitamin E, or both.  Hormone replacement therapy that contains estrogen with or without progestin.  Manage other health conditions such as hypertension and diabetes as directed by your health care provider.  Follow a heart-healthy diet. A dietitian can help to educate you about healthy food options and changes.  Use healthy cooking methods such as roasting, grilling, broiling, baking, poaching, steaming, or stir-frying. Talk to a dietitian to learn more about healthy cooking methods.  Follow an exercise program approved by your health care provider.  Maintain a healthy weight. Lose weight as approved by your health care provider.  Plan rest periods when fatigued.  Learn to manage stress.  Do not use any tobacco products, including cigarettes, chewing tobacco, or electronic cigarettes. If you need help quitting, ask your health care provider.  If you drink alcohol, and your health care provider approves, limit your alcohol intake to no more than 1 drink per day. One drink equals 12 ounces of beer, 5 ounces of wine, or 1 ounces of hard liquor.  Stop illegal drug use.  Keep all follow-up visits as directed by your health care provider. This is important. SEEK IMMEDIATE MEDICAL CARE IF:   You have pain in your chest, neck, arm, jaw, stomach, or back that lasts more than a few minutes, is recurring, or is unrelieved by taking sublingualnitroglycerin.  You have profuse sweating without cause.  You have unexplained:  Heartburn or indigestion.  Shortness of breath or difficulty breathing.  Nausea or vomiting.  Fatigue.  Feelings of nervousness or anxiety.  Weakness.  Diarrhea.  You have sudden light-headedness or dizziness.  You faint. These symptoms may represent a serious problem that is an emergency. Do not wait to see if the symptoms will go away. Get medical help right away. Call your local  emergency services (911 in the [place] Do not drive yourself to the hospital.   This information is not intended to replace advice given to you by your health care provider. Make sure you discuss any questions you have with your health care provider.   Document Released: 07/16/2005 Document Revised: 08/06/2014 Document Reviewed: 11/17/2013 Elsevier Interactive Patient Education [employer].

## 2015-05-09 DIAGNOSIS — D509 Iron deficiency anemia, unspecified: Secondary | ICD-10-CM | POA: Diagnosis not present

## 2015-05-09 DIAGNOSIS — N186 End stage renal disease: Secondary | ICD-10-CM | POA: Diagnosis not present

## 2015-05-09 DIAGNOSIS — N2581 Secondary hyperparathyroidism of renal origin: Secondary | ICD-10-CM | POA: Diagnosis not present

## 2015-05-09 DIAGNOSIS — D631 Anemia in chronic kidney disease: Secondary | ICD-10-CM | POA: Diagnosis not present

## 2015-05-09 DIAGNOSIS — E119 Type 2 diabetes mellitus without complications: Secondary | ICD-10-CM | POA: Diagnosis not present

## 2015-05-11 DIAGNOSIS — D631 Anemia in chronic kidney disease: Secondary | ICD-10-CM | POA: Diagnosis not present

## 2015-05-11 DIAGNOSIS — N186 End stage renal disease: Secondary | ICD-10-CM | POA: Diagnosis not present

## 2015-05-11 DIAGNOSIS — N2581 Secondary hyperparathyroidism of renal origin: Secondary | ICD-10-CM | POA: Diagnosis not present

## 2015-05-11 DIAGNOSIS — E119 Type 2 diabetes mellitus without complications: Secondary | ICD-10-CM | POA: Diagnosis not present

## 2015-05-11 DIAGNOSIS — D509 Iron deficiency anemia, unspecified: Secondary | ICD-10-CM | POA: Diagnosis not present

## 2015-05-13 DIAGNOSIS — N186 End stage renal disease: Secondary | ICD-10-CM | POA: Diagnosis not present

## 2015-05-13 DIAGNOSIS — E119 Type 2 diabetes mellitus without complications: Secondary | ICD-10-CM | POA: Diagnosis not present

## 2015-05-13 DIAGNOSIS — D631 Anemia in chronic kidney disease: Secondary | ICD-10-CM | POA: Diagnosis not present

## 2015-05-13 DIAGNOSIS — N2581 Secondary hyperparathyroidism of renal origin: Secondary | ICD-10-CM | POA: Diagnosis not present

## 2015-05-13 DIAGNOSIS — D509 Iron deficiency anemia, unspecified: Secondary | ICD-10-CM | POA: Diagnosis not present

## 2015-05-16 DIAGNOSIS — E785 Hyperlipidemia, unspecified: Secondary | ICD-10-CM | POA: Diagnosis not present

## 2015-05-16 DIAGNOSIS — I120 Hypertensive chronic kidney disease with stage 5 chronic kidney disease or end stage renal disease: Secondary | ICD-10-CM | POA: Diagnosis not present

## 2015-05-16 DIAGNOSIS — N2581 Secondary hyperparathyroidism of renal origin: Secondary | ICD-10-CM | POA: Diagnosis not present

## 2015-05-16 DIAGNOSIS — D631 Anemia in chronic kidney disease: Secondary | ICD-10-CM | POA: Diagnosis not present

## 2015-05-16 DIAGNOSIS — N186 End stage renal disease: Secondary | ICD-10-CM | POA: Diagnosis not present

## 2015-05-16 DIAGNOSIS — Z992 Dependence on renal dialysis: Secondary | ICD-10-CM | POA: Diagnosis not present

## 2015-05-16 DIAGNOSIS — R7309 Other abnormal glucose: Secondary | ICD-10-CM | POA: Diagnosis not present

## 2015-05-16 DIAGNOSIS — E119 Type 2 diabetes mellitus without complications: Secondary | ICD-10-CM | POA: Diagnosis not present

## 2015-05-16 DIAGNOSIS — D509 Iron deficiency anemia, unspecified: Secondary | ICD-10-CM | POA: Diagnosis not present

## 2015-05-16 DIAGNOSIS — I2510 Atherosclerotic heart disease of native coronary artery without angina pectoris: Secondary | ICD-10-CM | POA: Diagnosis not present

## 2015-05-16 DIAGNOSIS — E669 Obesity, unspecified: Secondary | ICD-10-CM | POA: Diagnosis not present

## 2015-05-16 DIAGNOSIS — D649 Anemia, unspecified: Secondary | ICD-10-CM | POA: Diagnosis not present

## 2015-05-16 DIAGNOSIS — I6529 Occlusion and stenosis of unspecified carotid artery: Secondary | ICD-10-CM | POA: Diagnosis not present

## 2015-05-17 DIAGNOSIS — Z7682 Awaiting organ transplant status: Secondary | ICD-10-CM | POA: Insufficient documentation

## 2015-05-17 DIAGNOSIS — I444 Left anterior fascicular block: Secondary | ICD-10-CM | POA: Diagnosis not present

## 2015-05-18 DIAGNOSIS — N186 End stage renal disease: Secondary | ICD-10-CM | POA: Diagnosis not present

## 2015-05-18 DIAGNOSIS — N2581 Secondary hyperparathyroidism of renal origin: Secondary | ICD-10-CM | POA: Diagnosis not present

## 2015-05-18 DIAGNOSIS — D509 Iron deficiency anemia, unspecified: Secondary | ICD-10-CM | POA: Diagnosis not present

## 2015-05-18 DIAGNOSIS — E119 Type 2 diabetes mellitus without complications: Secondary | ICD-10-CM | POA: Diagnosis not present

## 2015-05-18 DIAGNOSIS — D631 Anemia in chronic kidney disease: Secondary | ICD-10-CM | POA: Diagnosis not present

## 2015-05-20 ENCOUNTER — Encounter: Payer: Self-pay | Admitting: Vascular Surgery

## 2015-05-20 DIAGNOSIS — D509 Iron deficiency anemia, unspecified: Secondary | ICD-10-CM | POA: Diagnosis not present

## 2015-05-20 DIAGNOSIS — N186 End stage renal disease: Secondary | ICD-10-CM | POA: Diagnosis not present

## 2015-05-20 DIAGNOSIS — N2581 Secondary hyperparathyroidism of renal origin: Secondary | ICD-10-CM | POA: Diagnosis not present

## 2015-05-20 DIAGNOSIS — E119 Type 2 diabetes mellitus without complications: Secondary | ICD-10-CM | POA: Diagnosis not present

## 2015-05-20 DIAGNOSIS — D631 Anemia in chronic kidney disease: Secondary | ICD-10-CM | POA: Diagnosis not present

## 2015-05-23 DIAGNOSIS — E119 Type 2 diabetes mellitus without complications: Secondary | ICD-10-CM | POA: Diagnosis not present

## 2015-05-23 DIAGNOSIS — D509 Iron deficiency anemia, unspecified: Secondary | ICD-10-CM | POA: Diagnosis not present

## 2015-05-23 DIAGNOSIS — N2581 Secondary hyperparathyroidism of renal origin: Secondary | ICD-10-CM | POA: Diagnosis not present

## 2015-05-23 DIAGNOSIS — N186 End stage renal disease: Secondary | ICD-10-CM | POA: Diagnosis not present

## 2015-05-23 DIAGNOSIS — D631 Anemia in chronic kidney disease: Secondary | ICD-10-CM | POA: Diagnosis not present

## 2015-05-25 ENCOUNTER — Encounter: Payer: Self-pay | Admitting: Vascular Surgery

## 2015-05-25 ENCOUNTER — Ambulatory Visit (INDEPENDENT_AMBULATORY_CARE_PROVIDER_SITE_OTHER): Payer: Medicare Other | Admitting: Vascular Surgery

## 2015-05-25 VITALS — BP 156/81 | HR 81 | Ht 72.0 in | Wt 217.0 lb

## 2015-05-25 DIAGNOSIS — N2581 Secondary hyperparathyroidism of renal origin: Secondary | ICD-10-CM | POA: Diagnosis not present

## 2015-05-25 DIAGNOSIS — D631 Anemia in chronic kidney disease: Secondary | ICD-10-CM | POA: Diagnosis not present

## 2015-05-25 DIAGNOSIS — N186 End stage renal disease: Secondary | ICD-10-CM

## 2015-05-25 DIAGNOSIS — E119 Type 2 diabetes mellitus without complications: Secondary | ICD-10-CM | POA: Diagnosis not present

## 2015-05-25 DIAGNOSIS — Z992 Dependence on renal dialysis: Secondary | ICD-10-CM

## 2015-05-25 DIAGNOSIS — D509 Iron deficiency anemia, unspecified: Secondary | ICD-10-CM | POA: Diagnosis not present

## 2015-05-25 NOTE — Progress Notes (Signed)
Vascular and Vein Specialist of [facility]  Patient name: Jon [name] MRN: [id number] DOB: 07/15/1956 Sex: male  REASON FOR VISIT: Follow up  HPI: Jon [name] is a 59 y.o. male who had excision of a large aneurysm of his left brachiocephalic AV fistula on [id number]. The patient was seen in follow up on 04/20/2015. On 03/08/2015 the patient had undergone excision of a complex aneurysm. The fistula was very tortuous and I was able to excise the aneurysm and reanastomose the vein and 2 and. They have been using the lower part of the fistula for dialysis. This segment is also significantly aneurysmal and we wanted to address this also with some point. They are now using the upper part of his fistula where he had the revision.  She dialyzes on Mondays Wednesdays and Fridays.  Past Medical History  Diagnosis Date  . Hypertension   . Dialysis patient ([place])   . Chronic kidney disease   . GERD (gastroesophageal reflux disease)   . Myocardial infarction [facility])     lived in [place] maybe 7 years ago  . History of blood transfusion     Family History  Problem Relation Age of Onset  . Hypertension Mother   . Other Mother     varicose veins  . Diabetes Father   . Hypertension Father   . Hypertension Sister   . Other Sister     varicose veins  . Other Brother     varicose veins    SOCIAL HISTORY: Social History  Substance Use Topics  . Smoking status: Never Smoker   . Smokeless tobacco: Never Used  . Alcohol Use: No    No Known Allergies  Current Outpatient Prescriptions  Medication Sig Dispense Refill  . amLODipine (NORVASC) 10 MG tablet Take 10 mg by mouth at bedtime.    Marland Kitchen aspirin EC 81 MG EC tablet Take 1 tablet (81 mg total) by mouth daily. 30 tablet 3  . atorvastatin (LIPITOR) 20 MG tablet Take 1 tablet (20 mg total) by mouth daily at 6 PM. 30 tablet 3  . labetalol (NORMODYNE) 200 MG tablet Take 1 tablet (200 mg total) by mouth 2 (two) times daily. 60 tablet 0  . losartan  (COZAAR) 100 MG tablet Take 1 tablet (100 mg total) by mouth at bedtime. 30 tablet 0  . multivitamin (RENA-VIT) TABS tablet Take 1 tablet by mouth at bedtime.    . nitroGLYCERIN (NITROSTAT) 0.4 MG SL tablet Place 1 tablet (0.4 mg total) under the tongue every 5 (five) minutes x 3 doses as needed for chest pain. 25 tablet 12  . oxyCODONE (ROXICODONE) 5 MG immediate release tablet Take 1 tablet (5 mg total) by mouth every 6 (six) hours as needed. (Patient taking differently: Take 5 mg by mouth every 6 (six) hours as needed for moderate pain. ) 20 tablet 0  . spironolactone (ALDACTONE) 25 MG tablet Take 25 mg by mouth daily.     No current facility-administered medications for this visit.    REVIEW OF SYSTEMS:  [X]  denotes positive finding, [ ]  denotes negative finding Cardiac  Comments:  Chest pain or chest pressure:    Shortness of breath upon exertion:    Short of breath when lying flat:    Irregular heart rhythm:        Vascular    Pain in calf, thigh, or hip brought on by ambulation:    Pain in feet at night that wakes you up from your sleep:  Blood clot in your veins:    Leg swelling:         Pulmonary    Oxygen at home:    Productive cough:     Wheezing:         Neurologic    Sudden weakness in arms or legs:     Sudden numbness in arms or legs:     Sudden onset of difficulty speaking or slurred speech:    Temporary loss of vision in one eye:     Problems with dizziness:         Gastrointestinal    Blood in stool:     Vomited blood:         Genitourinary    Burning when urinating:     Blood in urine:        Psychiatric    Major depression:         Hematologic    Bleeding problems:    Problems with blood clotting too easily:        Skin    Rashes or ulcers:        Constitutional    Fever or chills:      PHYSICAL EXAM: Filed Vitals:   05/25/15 1532 05/25/15 1533  BP: 158/83 156/81  Pulse: 81   Height: 6' (1.829 m)   Weight: 217 lb (98.431 kg)     SpO2: 97%     GENERAL: The patient is a well-nourished male, in no acute distress. The vital signs are documented above. CARDIAC: There is a regular rate and rhythm.  VASCULAR: his upper arm fistula has an excellent bruit and thrill. The proximal segment is very aneurysmal. PULMONARY: There is good air exchange bilaterally without wheezing or rales. ABDOMEN: Soft and non-tender with normal pitched bowel sounds.  MUSCULOSKELETAL: There are no major deformities or cyanosis. NEUROLOGIC: No focal weakness or paresthesias are detected. SKIN: There are no ulcers or rashes noted. PSYCHIATRIC: The patient has a normal affect.  DATA:  No new data  MEDICAL ISSUES:  ANEURYSMAL LEFT BRACHIOCEPHALIC AV FISTULA: Now that they're using the upper portion of the fistula think we can revise the proximal fistula. He has a normal aneurysm here and this will be technically challenging but I think that revision is necessary given the risk of continued expansion and problems related to this. This surgery has been scheduled for 06/02/2015. All his questions were answered. We have discussed her seizure potential Cobb occasions and he is agreeable to proceed.   HYPERTENSION: The patient's initial blood pressure today was elevated. We repeated this and this was still elevated. We have encouraged the patient to follow up with their primary care physician for management of their blood pressure.   Deitra Mayo Vascular and Vein Specialists of [place]: [phone]

## 2015-05-26 ENCOUNTER — Other Ambulatory Visit: Payer: Self-pay

## 2015-05-27 DIAGNOSIS — D509 Iron deficiency anemia, unspecified: Secondary | ICD-10-CM | POA: Diagnosis not present

## 2015-05-27 DIAGNOSIS — N2581 Secondary hyperparathyroidism of renal origin: Secondary | ICD-10-CM | POA: Diagnosis not present

## 2015-05-27 DIAGNOSIS — N186 End stage renal disease: Secondary | ICD-10-CM | POA: Diagnosis not present

## 2015-05-27 DIAGNOSIS — D631 Anemia in chronic kidney disease: Secondary | ICD-10-CM | POA: Diagnosis not present

## 2015-05-27 DIAGNOSIS — E119 Type 2 diabetes mellitus without complications: Secondary | ICD-10-CM | POA: Diagnosis not present

## 2015-05-30 DIAGNOSIS — I120 Hypertensive chronic kidney disease with stage 5 chronic kidney disease or end stage renal disease: Secondary | ICD-10-CM | POA: Diagnosis not present

## 2015-05-30 DIAGNOSIS — N186 End stage renal disease: Secondary | ICD-10-CM | POA: Diagnosis not present

## 2015-05-30 DIAGNOSIS — D631 Anemia in chronic kidney disease: Secondary | ICD-10-CM | POA: Diagnosis not present

## 2015-05-30 DIAGNOSIS — E119 Type 2 diabetes mellitus without complications: Secondary | ICD-10-CM | POA: Diagnosis not present

## 2015-05-30 DIAGNOSIS — N2581 Secondary hyperparathyroidism of renal origin: Secondary | ICD-10-CM | POA: Diagnosis not present

## 2015-05-30 DIAGNOSIS — D509 Iron deficiency anemia, unspecified: Secondary | ICD-10-CM | POA: Diagnosis not present

## 2015-05-30 DIAGNOSIS — Z992 Dependence on renal dialysis: Secondary | ICD-10-CM | POA: Diagnosis not present

## 2015-05-31 ENCOUNTER — Encounter (HOSPITAL_COMMUNITY): Payer: Self-pay | Admitting: *Deleted

## 2015-05-31 ENCOUNTER — Other Ambulatory Visit: Payer: Self-pay

## 2015-05-31 NOTE — Progress Notes (Signed)
All orders in epic from [place] at [facility] except consent order.Message left to please put into epic.

## 2015-05-31 NOTE — Progress Notes (Signed)
Anesthesia Chart Review: SAME DAY WORK-UP.  Patient is a 59 year old male scheduled for revision of LUE AVF (excision of large aneurysm) on 06/02/15 by Dr. Scot Dock. He is s/p repair of a large aneurysm of his left brachiocephalic AVF on [id number]. Post-operatively dialysis staff were initially only able to use the lower part of the AVF, but since they are able to now use the upper part, further aneurysm excision is needed.   History includes ESRD (HD MWF, [place]), never smoker, HLD, HTN, GERD, [place] ([place] > [place] ago), small intestine surgery. He was recently hospitalized on 05/01/15 for acute respiratory failure with pulmonary edema and chest pain in the setting of hypertensive urgency (187/97). CTA chest was negative for aortic dissection. Troponin peaked at 0.15. Cardiologist Dr. Terrence Dupont was consulted. Possible mild anterolateral ischemia on stress test, but cath showed only 10% LAD stenosis. Last seen by Dr. Terrence Dupont 05/16/15 with continued medical therapy recommended.  Meds include amlodipine, ASA, Lipitor, Coreg, labetalol, losartan, Nitro, oxycodone, Aldactone.   05/02/15 EKG: SR with PACs, PVCs or fusion complexes, possible LAE, prolonged QT, non-specific ST/T changes.  05/05/15 Cardiac cath: Proximal and mid LAD 10% stenosis. The left ventricular systolic function is normal. The left ventricular ejection fraction is 55-65% by visual estimate. There are no wall motion abnormalities in the left ventricle.  05/03/15 Nuclear stress test: IMPRESSION: 1. Small area of reversibility along the base of the anterolateral wall. This finding is equivocal for pharmacologically induced ischemia based on the location and size. No other areas are concerning for ischemia or infarct. 2. Left ventricular dilatation with mild hypokinesia. 3. Left ventricular ejection fraction is 46% and stable. 4. Intermediate-risk stress test findings*. Cardiac cath recommended (see above).  05/03/15 Echo: Study Conclusions - Left  ventricle: The cavity size was normal. There was severe concentric hypertrophy. The appearance was consistent with hypertrophic cardiomyopathy. Systolic function was vigorous. The estimated ejection fraction was in the range of 65% to 70%. There was no dynamic obstruction. Wall motion was normal; there were no regional wall motion abnormalities. Features are consistent with a pseudonormal left ventricular filling pattern, with concomitantabnormal relaxation and increased filling pressure (grade 2 diastolic dysfunction). - Aortic valve: Trileaflet; mildly thickened, mildly calcified leaflets. Transvalvular velocity was minimally increased. There was no stenosis. There was trivial regurgitation. - Mitral valve: Calcified annulus. There was mild regurgitation. - Left atrium: The atrium was moderately dilated. - Right atrium: The atrium was mildly dilated.  11/23/14 Carotid duplex: Findings suggest 1-39% internal carotid artery stenosis bilaterally. Vertebral arteries are patent with antegrad flow.  05/01/15 CXR: IMPRESSION: Findings most concerning for mild pulmonary edema.  He is for ISTAT4 on arrival.   His recent cath showed no significant CAD, normal LVF. He is currently on hemodialysis and needs his AVF further revised. If labs and BP are acceptable and no acute changes then I would anticipate that he could proceed as planned.   George [facility]/Anesthesiology Phone [phone] 05/31/2015 5:27 PM

## 2015-06-01 DIAGNOSIS — N2581 Secondary hyperparathyroidism of renal origin: Secondary | ICD-10-CM | POA: Diagnosis not present

## 2015-06-01 DIAGNOSIS — R300 Dysuria: Secondary | ICD-10-CM | POA: Diagnosis not present

## 2015-06-01 DIAGNOSIS — E119 Type 2 diabetes mellitus without complications: Secondary | ICD-10-CM | POA: Diagnosis not present

## 2015-06-01 DIAGNOSIS — D509 Iron deficiency anemia, unspecified: Secondary | ICD-10-CM | POA: Diagnosis not present

## 2015-06-01 DIAGNOSIS — N186 End stage renal disease: Secondary | ICD-10-CM | POA: Diagnosis not present

## 2015-06-01 MED ORDER — DEXTROSE 5 % IV SOLN
1.5000 g | INTRAVENOUS | Status: AC
  Administered 2015-06-02: 1.5 g via INTRAVENOUS

## 2015-06-01 MED ORDER — CHLORHEXIDINE GLUCONATE CLOTH 2 % EX PADS: 6.0000 | MEDICATED_PAD | Freq: Once | CUTANEOUS | Status: DC

## 2015-06-01 MED ORDER — SODIUM CHLORIDE 0.9 % IV SOLN
INTRAVENOUS | Status: DC
  Administered 2015-06-02 (×3): via INTRAVENOUS

## 2015-06-02 ENCOUNTER — Encounter (HOSPITAL_COMMUNITY)
Admission: RE | Disposition: A | Discharge status: Home / Self Care | Payer: Self-pay | Source: Ambulatory Visit | Attending: Vascular Surgery

## 2015-06-02 ENCOUNTER — Ambulatory Visit (HOSPITAL_COMMUNITY)
Admission: RE | Admit: 2015-06-02 | Discharge: 2015-06-02 | Disposition: A | Discharge status: Home / Self Care | Payer: Medicare Other | Source: Ambulatory Visit | Attending: Vascular Surgery | Admitting: Vascular Surgery

## 2015-06-02 ENCOUNTER — Ambulatory Visit (HOSPITAL_COMMUNITY): Payer: Medicare Other | Admitting: Vascular Surgery

## 2015-06-02 ENCOUNTER — Encounter (HOSPITAL_COMMUNITY): Payer: Self-pay | Admitting: *Deleted

## 2015-06-02 DIAGNOSIS — K219 Gastro-esophageal reflux disease without esophagitis: Secondary | ICD-10-CM | POA: Diagnosis not present

## 2015-06-02 DIAGNOSIS — I120 Hypertensive chronic kidney disease with stage 5 chronic kidney disease or end stage renal disease: Secondary | ICD-10-CM | POA: Insufficient documentation

## 2015-06-02 DIAGNOSIS — Z992 Dependence on renal dialysis: Secondary | ICD-10-CM | POA: Insufficient documentation

## 2015-06-02 DIAGNOSIS — N186 End stage renal disease: Secondary | ICD-10-CM | POA: Diagnosis not present

## 2015-06-02 DIAGNOSIS — I252 Old myocardial infarction: Secondary | ICD-10-CM | POA: Diagnosis not present

## 2015-06-02 DIAGNOSIS — T82898A Other specified complication of vascular prosthetic devices, implants and grafts, initial encounter: Secondary | ICD-10-CM | POA: Insufficient documentation

## 2015-06-02 DIAGNOSIS — I10 Essential (primary) hypertension: Secondary | ICD-10-CM | POA: Insufficient documentation

## 2015-06-02 DIAGNOSIS — E785 Hyperlipidemia, unspecified: Secondary | ICD-10-CM | POA: Diagnosis not present

## 2015-06-02 DIAGNOSIS — I729 Aneurysm of unspecified site: Secondary | ICD-10-CM | POA: Diagnosis not present

## 2015-06-02 DIAGNOSIS — Z7982 Long term (current) use of aspirin: Secondary | ICD-10-CM | POA: Insufficient documentation

## 2015-06-02 DIAGNOSIS — T82818A Embolism of vascular prosthetic devices, implants and grafts, initial encounter: Secondary | ICD-10-CM | POA: Diagnosis not present

## 2015-06-02 DIAGNOSIS — Y832 Surgical operation with anastomosis, bypass or graft as the cause of abnormal reaction of the patient, or of later complication, without mention of misadventure at the time of the procedure: Secondary | ICD-10-CM | POA: Insufficient documentation

## 2015-06-02 HISTORY — PX: REVISON OF ARTERIOVENOUS FISTULA: SHX6074

## 2015-06-02 HISTORY — DX: Hyperlipidemia, unspecified: E78.5

## 2015-06-02 LAB — POCT I-STAT 4, (NA,K, GLUC, HGB,HCT)
Glucose, Bld: 87 mg/dL (ref 65–99)
HCT: 42 % (ref 39.0–52.0)
Hemoglobin: 14.3 g/dL (ref 13.0–17.0)
Potassium: 5.1 mmol/L (ref 3.5–5.1)
SODIUM: 135 mmol/L (ref 135–145)

## 2015-06-02 SURGERY — REVISON OF ARTERIOVENOUS FISTULA
Anesthesia: General | Site: Arm Upper | Laterality: Left

## 2015-06-02 MED ORDER — OXYCODONE HCL 5 MG PO TABS: 5.0000 mg | ORAL_TABLET | Freq: Four times a day (QID) | ORAL | Status: DC | PRN

## 2015-06-02 MED ORDER — PHENYLEPHRINE HCL 10 MG/ML IJ SOLN
10.0000 mg | INTRAVENOUS | Status: DC | PRN
  Administered 2015-06-02: 20 ug/min via INTRAVENOUS

## 2015-06-02 MED ORDER — LIDOCAINE HCL (CARDIAC) 20 MG/ML IV SOLN
INTRAVENOUS | Status: AC
  Filled 2015-06-02: qty 5

## 2015-06-02 MED ORDER — LIDOCAINE-EPINEPHRINE (PF) 1 %-1:200000 IJ SOLN
INTRAMUSCULAR | Status: AC
  Filled 2015-06-02: qty 30

## 2015-06-02 MED ORDER — OXYCODONE HCL 5 MG PO TABS
ORAL_TABLET | ORAL | Status: AC
  Administered 2015-06-02: 5 mg
  Filled 2015-06-02: qty 1

## 2015-06-02 MED ORDER — 0.9 % SODIUM CHLORIDE (POUR BTL) OPTIME
TOPICAL | Status: DC | PRN
  Administered 2015-06-02: 1000 mL

## 2015-06-02 MED ORDER — LIDOCAINE HCL (PF) 1 % IJ SOLN
INTRAMUSCULAR | Status: AC
  Filled 2015-06-02: qty 30

## 2015-06-02 MED ORDER — ONDANSETRON HCL 4 MG/2ML IJ SOLN
INTRAMUSCULAR | Status: DC | PRN
  Administered 2015-06-02: 4 mg via INTRAVENOUS

## 2015-06-02 MED ORDER — LIDOCAINE HCL (CARDIAC) 20 MG/ML IV SOLN
INTRAVENOUS | Status: DC | PRN
  Administered 2015-06-02: 60 mg via INTRAVENOUS

## 2015-06-02 MED ORDER — DEXTROSE 5 % IV SOLN
INTRAVENOUS | Status: AC
  Filled 2015-06-02: qty 1.5

## 2015-06-02 MED ORDER — PROPOFOL 10 MG/ML IV BOLUS
INTRAVENOUS | Status: DC | PRN
  Administered 2015-06-02: 200 mg via INTRAVENOUS

## 2015-06-02 MED ORDER — FENTANYL CITRATE (PF) 250 MCG/5ML IJ SOLN
INTRAMUSCULAR | Status: AC
  Filled 2015-06-02: qty 5

## 2015-06-02 MED ORDER — ONDANSETRON HCL 4 MG/2ML IJ SOLN
INTRAMUSCULAR | Status: AC
  Filled 2015-06-02: qty 2

## 2015-06-02 MED ORDER — PROTAMINE SULFATE 10 MG/ML IV SOLN
INTRAVENOUS | Status: DC | PRN
  Administered 2015-06-02: 40 mg via INTRAVENOUS

## 2015-06-02 MED ORDER — HEPARIN SODIUM (PORCINE) 1000 UNIT/ML IJ SOLN
INTRAMUSCULAR | Status: AC
  Filled 2015-06-02: qty 2

## 2015-06-02 MED ORDER — SUCCINYLCHOLINE CHLORIDE 20 MG/ML IJ SOLN
INTRAMUSCULAR | Status: AC
  Filled 2015-06-02: qty 1

## 2015-06-02 MED ORDER — FENTANYL CITRATE (PF) 100 MCG/2ML IJ SOLN
INTRAMUSCULAR | Status: DC | PRN
  Administered 2015-06-02 (×2): 50 ug via INTRAVENOUS

## 2015-06-02 MED ORDER — EPHEDRINE SULFATE 50 MG/ML IJ SOLN
INTRAMUSCULAR | Status: DC | PRN
  Administered 2015-06-02: 10 mg via INTRAVENOUS
  Administered 2015-06-02: 15 mg via INTRAVENOUS
  Administered 2015-06-02: 10 mg via INTRAVENOUS
  Administered 2015-06-02: 5 mg via INTRAVENOUS
  Administered 2015-06-02: 10 mg via INTRAVENOUS

## 2015-06-02 MED ORDER — MIDAZOLAM HCL 5 MG/5ML IJ SOLN
INTRAMUSCULAR | Status: DC | PRN
  Administered 2015-06-02: 2 mg via INTRAVENOUS

## 2015-06-02 MED ORDER — MIDAZOLAM HCL 2 MG/2ML IJ SOLN
INTRAMUSCULAR | Status: AC
  Filled 2015-06-02: qty 4

## 2015-06-02 MED ORDER — THROMBIN 20000 UNITS EX SOLR
CUTANEOUS | Status: AC
  Filled 2015-06-02: qty 20000

## 2015-06-02 MED ORDER — PROPOFOL 10 MG/ML IV BOLUS
INTRAVENOUS | Status: AC
  Filled 2015-06-02: qty 20

## 2015-06-02 MED ORDER — GLYCOPYRROLATE 0.2 MG/ML IJ SOLN
INTRAMUSCULAR | Status: AC
  Filled 2015-06-02: qty 1

## 2015-06-02 MED ORDER — GLYCOPYRROLATE 0.2 MG/ML IJ SOLN
INTRAMUSCULAR | Status: DC | PRN
  Administered 2015-06-02: 0.2 mg via INTRAVENOUS

## 2015-06-02 MED ORDER — LIDOCAINE-EPINEPHRINE (PF) 1 %-1:200000 IJ SOLN
INTRAMUSCULAR | Status: DC | PRN
  Administered 2015-06-02: 10 mL

## 2015-06-02 MED ORDER — PHENYLEPHRINE HCL 10 MG/ML IJ SOLN
INTRAMUSCULAR | Status: DC | PRN
  Administered 2015-06-02 (×5): 80 ug via INTRAVENOUS

## 2015-06-02 MED ORDER — HYDROMORPHONE HCL 1 MG/ML IJ SOLN: 0.2500 mg | INTRAMUSCULAR | Status: DC | PRN

## 2015-06-02 MED ORDER — SODIUM CHLORIDE 0.9 % IJ SOLN
INTRAMUSCULAR | Status: AC
  Filled 2015-06-02: qty 10

## 2015-06-02 MED ORDER — ARTIFICIAL TEARS OP OINT
TOPICAL_OINTMENT | OPHTHALMIC | Status: AC
  Filled 2015-06-02: qty 3.5

## 2015-06-02 MED ORDER — EPHEDRINE SULFATE 50 MG/ML IJ SOLN
INTRAMUSCULAR | Status: AC
  Filled 2015-06-02: qty 1

## 2015-06-02 MED ORDER — SODIUM CHLORIDE 0.9 % IV SOLN
INTRAVENOUS | Status: DC | PRN
  Administered 2015-06-02: 500 mL

## 2015-06-02 MED ORDER — ROCURONIUM BROMIDE 50 MG/5ML IV SOLN
INTRAVENOUS | Status: AC
  Filled 2015-06-02: qty 1

## 2015-06-02 MED ORDER — HEPARIN SODIUM (PORCINE) 1000 UNIT/ML IJ SOLN
INTRAMUSCULAR | Status: DC | PRN
  Administered 2015-06-02: 8000 [IU] via INTRAVENOUS

## 2015-06-02 SURGICAL SUPPLY — 37 items
CANISTER SUCTION 2500CC (MISCELLANEOUS) ×3 IMPLANT
CANNULA VESSEL 3MM 2 BLNT TIP (CANNULA) IMPLANT
CLIP TI MEDIUM 6 (CLIP) ×3 IMPLANT
CLIP TI WIDE RED SMALL 6 (CLIP) ×3 IMPLANT
COVER PROBE W GEL 5X96 (DRAPES) ×3 IMPLANT
ELECT CAUTERY BLADE 6.4 (BLADE) ×3 IMPLANT
ELECT REM PT RETURN 9FT ADLT (ELECTROSURGICAL) ×3
ELECTRODE REM PT RTRN 9FT ADLT (ELECTROSURGICAL) ×1 IMPLANT
GLOVE BIO SURGEON STRL SZ 6.5 (GLOVE) ×6 IMPLANT
GLOVE BIO SURGEON STRL SZ7.5 (GLOVE) ×3 IMPLANT
GLOVE BIO SURGEONS STRL SZ 6.5 (GLOVE) ×3
GLOVE BIOGEL PI IND STRL 6.5 (GLOVE) ×2 IMPLANT
GLOVE BIOGEL PI IND STRL 7.0 (GLOVE) ×1 IMPLANT
GLOVE BIOGEL PI IND STRL 8 (GLOVE) ×1 IMPLANT
GLOVE BIOGEL PI INDICATOR 6.5 (GLOVE) ×4
GLOVE BIOGEL PI INDICATOR 7.0 (GLOVE) ×2
GLOVE BIOGEL PI INDICATOR 8 (GLOVE) ×2
GLOVE SURG SS PI 7.0 STRL IVOR (GLOVE) ×3 IMPLANT
GOWN STRL REUS W/ TWL LRG LVL3 (GOWN DISPOSABLE) ×3 IMPLANT
GOWN STRL REUS W/ TWL XL LVL3 (GOWN DISPOSABLE) ×1 IMPLANT
GOWN STRL REUS W/TWL LRG LVL3 (GOWN DISPOSABLE) ×6
GOWN STRL REUS W/TWL XL LVL3 (GOWN DISPOSABLE) ×2
KIT BASIN OR (CUSTOM PROCEDURE TRAY) ×3 IMPLANT
KIT ROOM TURNOVER OR (KITS) ×3 IMPLANT
LIQUID BAND (GAUZE/BANDAGES/DRESSINGS) ×3 IMPLANT
NS IRRIG 1000ML POUR BTL (IV SOLUTION) ×3 IMPLANT
PACK CV ACCESS (CUSTOM PROCEDURE TRAY) ×3 IMPLANT
PAD ARMBOARD 7.5X6 YLW CONV (MISCELLANEOUS) ×6 IMPLANT
PENCIL BUTTON HOLSTER BLD 10FT (ELECTRODE) ×3 IMPLANT
SPONGE SURGIFOAM ABS GEL 100 (HEMOSTASIS) IMPLANT
SUT PROLENE 5 0 C 1 24 (SUTURE) ×6 IMPLANT
SUT PROLENE 6 0 BV (SUTURE) ×3 IMPLANT
SUT VIC AB 3-0 SH 27 (SUTURE) ×4
SUT VIC AB 3-0 SH 27X BRD (SUTURE) ×2 IMPLANT
SUT VICRYL 4-0 PS2 18IN ABS (SUTURE) ×3 IMPLANT
UNDERPAD 30X30 INCONTINENT (UNDERPADS AND DIAPERS) ×3 IMPLANT
WATER STERILE IRR 1000ML POUR (IV SOLUTION) ×3 IMPLANT

## 2015-06-02 NOTE — H&P (View-Only) (Signed)
Vascular and Vein Specialist of [facility]  Patient name: Jon [name] MRN: [id number] DOB: 02-11-1956 Sex: male  REASON FOR VISIT: Follow up  HPI: Jon [name] is a 59 y.o. male who had excision of a large aneurysm of his left brachiocephalic AV fistula on [id number]. The patient was seen in follow up on 04/20/2015. On 03/08/2015 the patient had undergone excision of a complex aneurysm. The fistula was very tortuous and I was able to excise the aneurysm and reanastomose the vein and 2 and. They have been using the lower part of the fistula for dialysis. This segment is also significantly aneurysmal and we wanted to address this also with some point. They are now using the upper part of his fistula where he had the revision.  She dialyzes on Mondays Wednesdays and Fridays.  Past Medical History  Diagnosis Date  . Hypertension   . Dialysis patient ([place])   . Chronic kidney disease   . GERD (gastroesophageal reflux disease)   . Myocardial infarction [facility])     lived in [place] maybe 7 years ago  . History of blood transfusion     Family History  Problem Relation Age of Onset  . Hypertension Mother   . Other Mother     varicose veins  . Diabetes Father   . Hypertension Father   . Hypertension Sister   . Other Sister     varicose veins  . Other Brother     varicose veins    SOCIAL HISTORY: Social History  Substance Use Topics  . Smoking status: Never Smoker   . Smokeless tobacco: Never Used  . Alcohol Use: No    No Known Allergies  Current Outpatient Prescriptions  Medication Sig Dispense Refill  . amLODipine (NORVASC) 10 MG tablet Take 10 mg by mouth at bedtime.    Marland Kitchen aspirin EC 81 MG EC tablet Take 1 tablet (81 mg total) by mouth daily. 30 tablet 3  . atorvastatin (LIPITOR) 20 MG tablet Take 1 tablet (20 mg total) by mouth daily at 6 PM. 30 tablet 3  . labetalol (NORMODYNE) 200 MG tablet Take 1 tablet (200 mg total) by mouth 2 (two) times daily. 60 tablet 0  . losartan  (COZAAR) 100 MG tablet Take 1 tablet (100 mg total) by mouth at bedtime. 30 tablet 0  . multivitamin (RENA-VIT) TABS tablet Take 1 tablet by mouth at bedtime.    . nitroGLYCERIN (NITROSTAT) 0.4 MG SL tablet Place 1 tablet (0.4 mg total) under the tongue every 5 (five) minutes x 3 doses as needed for chest pain. 25 tablet 12  . oxyCODONE (ROXICODONE) 5 MG immediate release tablet Take 1 tablet (5 mg total) by mouth every 6 (six) hours as needed. (Patient taking differently: Take 5 mg by mouth every 6 (six) hours as needed for moderate pain. ) 20 tablet 0  . spironolactone (ALDACTONE) 25 MG tablet Take 25 mg by mouth daily.     No current facility-administered medications for this visit.    REVIEW OF SYSTEMS:  [X]  denotes positive finding, [ ]  denotes negative finding Cardiac  Comments:  Chest pain or chest pressure:    Shortness of breath upon exertion:    Short of breath when lying flat:    Irregular heart rhythm:        Vascular    Pain in calf, thigh, or hip brought on by ambulation:    Pain in feet at night that wakes you up from your sleep:  Blood clot in your veins:    Leg swelling:         Pulmonary    Oxygen at home:    Productive cough:     Wheezing:         Neurologic    Sudden weakness in arms or legs:     Sudden numbness in arms or legs:     Sudden onset of difficulty speaking or slurred speech:    Temporary loss of vision in one eye:     Problems with dizziness:         Gastrointestinal    Blood in stool:     Vomited blood:         Genitourinary    Burning when urinating:     Blood in urine:        Psychiatric    Major depression:         Hematologic    Bleeding problems:    Problems with blood clotting too easily:        Skin    Rashes or ulcers:        Constitutional    Fever or chills:      PHYSICAL EXAM: Filed Vitals:   05/25/15 1532 05/25/15 1533  BP: 158/83 156/81  Pulse: 81   Height: 6' (1.829 m)   Weight: 217 lb (98.431 kg)     SpO2: 97%     GENERAL: The patient is a well-nourished male, in no acute distress. The vital signs are documented above. CARDIAC: There is a regular rate and rhythm.  VASCULAR: his upper arm fistula has an excellent bruit and thrill. The proximal segment is very aneurysmal. PULMONARY: There is good air exchange bilaterally without wheezing or rales. ABDOMEN: Soft and non-tender with normal pitched bowel sounds.  MUSCULOSKELETAL: There are no major deformities or cyanosis. NEUROLOGIC: No focal weakness or paresthesias are detected. SKIN: There are no ulcers or rashes noted. PSYCHIATRIC: The patient has a normal affect.  DATA:  No new data  MEDICAL ISSUES:  ANEURYSMAL LEFT BRACHIOCEPHALIC AV FISTULA: Now that they're using the upper portion of the fistula think we can revise the proximal fistula. He has a normal aneurysm here and this will be technically challenging but I think that revision is necessary given the risk of continued expansion and problems related to this. This surgery has been scheduled for 06/02/2015. All his questions were answered. We have discussed her seizure potential Cobb occasions and he is agreeable to proceed.   HYPERTENSION: The patient's initial blood pressure today was elevated. We repeated this and this was still elevated. We have encouraged the patient to follow up with their primary care physician for management of their blood pressure.   Deitra Mayo Vascular and Vein Specialists of [place]: [phone]

## 2015-06-02 NOTE — Anesthesia Postprocedure Evaluation (Signed)
  Anesthesia Post-op Note  Patient: [name]  Procedure(s) Performed: Procedure(s): PLICATION OF A LARGE ANEURYSM LEFT UPPER ARM BRACHIO-CEPHALIC ARTERIOVENOUS FISTULA  (Left)  Patient Location: PACU  Anesthesia Type:General  Level of Consciousness: awake and alert   Airway and Oxygen Therapy: Patient Spontanous Breathing  Post-op Pain: Controlled  Post-op Assessment: Post-op Vital signs reviewed, Patient's Cardiovascular Status Stable and Respiratory Function Stable  Post-op Vital Signs: Reviewed  Filed Vitals:   06/02/15 1237  BP: 145/91  Pulse:   Temp: 36.7 C  Resp:     Complications: No apparent anesthesia complications

## 2015-06-02 NOTE — Transfer of Care (Signed)
Immediate Anesthesia Transfer of Care Note  Patient: Jon [name]  Procedure(s) Performed: Procedure(s): PLICATION OF A LARGE ANEURYSM LEFT UPPER ARM BRACHIO-CEPHALIC ARTERIOVENOUS FISTULA  (Left)  Patient Location: PACU  Anesthesia Type:General  Level of Consciousness: awake, alert , oriented and sedated  Airway & Oxygen Therapy: Patient Spontanous Breathing and Patient connected to nasal cannula oxygen  Post-op Assessment: Report given to RN, Post -op Vital signs reviewed and stable and Patient moving all extremities  Post vital signs: Reviewed and stable  Last Vitals:  Filed Vitals:   06/02/15 0900  BP: 180/99  Pulse: 65  Temp: 36.5 C  Resp: 18    Complications: No apparent anesthesia complications

## 2015-06-02 NOTE — Op Note (Signed)
    NAMEAlexande Willever    MRN: [id number] DOB: 04/22/1956    DATE OF OPERATION: 06/02/2015  PREOP DIAGNOSIS: aneurysm of left brachiocephalic AV fistula  POSTOP DIAGNOSIS: same  PROCEDURE: Plication of large aneurysm of left brachiocephalic AV fistula  SURGEON: Judeth Cornfield. Scot Dock, MD, FACS  ASSIST: Leontine Locket, PA  ANESTHESIA: Gen.   EBL: minimal  INDICATIONS: Jon [name] is a 59 y.o. male he had a large aneurysmal left upper arm fistula. He had the most proximal segment excised previously and this is healed and they have been using this segment of the fistula for dialysis. He now presents to have the more peripheral segment excised.   FINDINGS: Large aneurysm of left brachiocephalic AV fistula  TECHNIQUE: The patient was taken to the operating room and received a general anesthetic. The left upper extremity was prepped and draped in usual sterile fashion. A large elliptical incision was made encompassing this large aneurysm just above the antecubital level. I did use 1% lidocaine with epinephrine for hemostatic purposes. The aneurysm was dissected free down to the anastomosis to the brachial artery and then up to the proximal to mid upper arm. It was dissected free circumferentially. The patient was then heparinized. The aneurysm was then clamped proximally and distally. A large ellipse of the aneurysm was excised along the lateral anterior aspect where the vessel was especially weak. This was then closed primarily with 2 layers of 5-0 Prolene suture. At the completion was a good thrill in the fistula. Hemostasis was obtained in the wound. The heparin was partially reversed with protamine. The wound was closed with 2 deep layers of 3-0 Vicryl and skin closed with 4-0 Vicryl. Liquid band was applied. The patient tolerated the procedure well and transferred to recovery room in stable condition. All needle and sponge counts were correct.  Deitra Mayo, MD, FACS Vascular and Vein  Specialists of [facility]  DATE OF DICTATION:   06/02/2015

## 2015-06-02 NOTE — Interval H&P Note (Signed)
History and Physical Interval Note:  06/02/2015 9:23 AM  Jon [name]  has presented today for surgery, with the diagnosis of End Stage Renal Disease N18.6; Left arm arteriovenous fistula aneurysm I72.9  The various methods of treatment have been discussed with the patient and family. After consideration of risks, benefits and other options for treatment, the patient has consented to  Procedure(s): REVISON OF ARTERIOVENOUS FISTULA ([place]) (Left) as a surgical intervention .  The patient's history has been reviewed, patient examined, no change in status, stable for surgery.  I have reviewed the patient's chart and labs.  Questions were answered to the patient's satisfaction.     Deitra Mayo

## 2015-06-02 NOTE — Discharge Instructions (Signed)
° ° °  06/02/2015 Jon [name] [id number] 07-Mar-1956  Surgeon(s): Angelia Mould, MD  Procedure(s): PLICATION OF A LARGE ANEURYSM LEFT UPPER ARM BRACHIO-CEPHALIC ARTERIOVENOUS FISTULA   x May stick graft on designated area only:  Do NOT stick fistula over incision for 3 months. May stick above (proximal)  Incision immediately. See diagram.

## 2015-06-02 NOTE — Anesthesia Preprocedure Evaluation (Addendum)
Anesthesia Evaluation  Patient identified by MRN, date of birth, ID band Patient awake    Reviewed: Allergy & Precautions, H&P , NPO status , Patient's Chart, lab work & pertinent test results, reviewed documented beta blocker date and time   Airway Mallampati: II  TM Distance: >3 FB Neck ROM: Full    Dental no notable dental hx. (+) Partial Upper, Dental Advisory Given   Pulmonary neg pulmonary ROS,    Pulmonary exam normal breath sounds clear to auscultation       Cardiovascular hypertension, Pt. on medications and Pt. on home beta blockers + Past MI   Rhythm:Regular Rate:Normal     Neuro/Psych negative neurological ROS  negative psych ROS   GI/Hepatic Neg liver ROS, GERD  Controlled,  Endo/Other  negative endocrine ROS  Renal/GU ESRF and DialysisRenal disease  negative genitourinary   Musculoskeletal   Abdominal   Peds  Hematology negative hematology ROS (+)   Anesthesia Other Findings   Reproductive/Obstetrics negative OB ROS                            Anesthesia Physical Anesthesia Plan  ASA: III  Anesthesia Plan: General   Post-op Pain Management:    Induction: Intravenous  Airway Management Planned: LMA  Additional Equipment:   Intra-op Plan:   Post-operative Plan: Extubation in OR  Informed Consent: I have reviewed the patients History and Physical, chart, labs and discussed the procedure including the risks, benefits and alternatives for the proposed anesthesia with the patient or authorized representative who has indicated his/her understanding and acceptance.   Dental advisory given  Plan Discussed with: CRNA  Anesthesia Plan Comments:         Anesthesia Quick Evaluation

## 2015-06-03 ENCOUNTER — Encounter (HOSPITAL_COMMUNITY): Payer: Self-pay | Admitting: Vascular Surgery

## 2015-06-03 DIAGNOSIS — N186 End stage renal disease: Secondary | ICD-10-CM | POA: Diagnosis not present

## 2015-06-03 DIAGNOSIS — D509 Iron deficiency anemia, unspecified: Secondary | ICD-10-CM | POA: Diagnosis not present

## 2015-06-03 DIAGNOSIS — N2581 Secondary hyperparathyroidism of renal origin: Secondary | ICD-10-CM | POA: Diagnosis not present

## 2015-06-03 DIAGNOSIS — E119 Type 2 diabetes mellitus without complications: Secondary | ICD-10-CM | POA: Diagnosis not present

## 2015-06-03 DIAGNOSIS — R300 Dysuria: Secondary | ICD-10-CM | POA: Diagnosis not present

## 2015-06-06 ENCOUNTER — Telehealth: Payer: Self-pay | Admitting: Vascular Surgery

## 2015-06-06 DIAGNOSIS — R300 Dysuria: Secondary | ICD-10-CM | POA: Diagnosis not present

## 2015-06-06 DIAGNOSIS — N186 End stage renal disease: Secondary | ICD-10-CM | POA: Diagnosis not present

## 2015-06-06 DIAGNOSIS — D509 Iron deficiency anemia, unspecified: Secondary | ICD-10-CM | POA: Diagnosis not present

## 2015-06-06 DIAGNOSIS — N2581 Secondary hyperparathyroidism of renal origin: Secondary | ICD-10-CM | POA: Diagnosis not present

## 2015-06-06 DIAGNOSIS — E119 Type 2 diabetes mellitus without complications: Secondary | ICD-10-CM | POA: Diagnosis not present

## 2015-06-06 NOTE — Telephone Encounter (Signed)
Spoke with pt to schedule appointment, dpm °

## 2015-06-06 NOTE — Telephone Encounter (Signed)
-----   Message from Denman George, RN sent at 06/02/2015  1:05 PM EDT ----- Regarding: Jon [name]; also needs 3-4 wk. f/u with [facility]   ----- Message -----    From: Angelia Mould, MD    Sent: 06/02/2015  11:18 AM      To: [facility] Charge Pool Subject: charge and f/u                                 PROCEDURE: Plication of large aneurysm of left brachiocephalic AV fistula  SURGEON: Judeth Cornfield. Scot Dock, MD, FACS  ASSIST: Leontine Locket, PA  He will need a follow up visit in 3-4 weeks. Thank you. CD

## 2015-06-08 DIAGNOSIS — D509 Iron deficiency anemia, unspecified: Secondary | ICD-10-CM | POA: Diagnosis not present

## 2015-06-08 DIAGNOSIS — E119 Type 2 diabetes mellitus without complications: Secondary | ICD-10-CM | POA: Diagnosis not present

## 2015-06-08 DIAGNOSIS — R300 Dysuria: Secondary | ICD-10-CM | POA: Diagnosis not present

## 2015-06-08 DIAGNOSIS — N2581 Secondary hyperparathyroidism of renal origin: Secondary | ICD-10-CM | POA: Diagnosis not present

## 2015-06-08 DIAGNOSIS — N186 End stage renal disease: Secondary | ICD-10-CM | POA: Diagnosis not present

## 2015-06-10 DIAGNOSIS — D509 Iron deficiency anemia, unspecified: Secondary | ICD-10-CM | POA: Diagnosis not present

## 2015-06-10 DIAGNOSIS — R300 Dysuria: Secondary | ICD-10-CM | POA: Diagnosis not present

## 2015-06-10 DIAGNOSIS — E119 Type 2 diabetes mellitus without complications: Secondary | ICD-10-CM | POA: Diagnosis not present

## 2015-06-10 DIAGNOSIS — N186 End stage renal disease: Secondary | ICD-10-CM | POA: Diagnosis not present

## 2015-06-10 DIAGNOSIS — N2581 Secondary hyperparathyroidism of renal origin: Secondary | ICD-10-CM | POA: Diagnosis not present

## 2015-06-13 DIAGNOSIS — D509 Iron deficiency anemia, unspecified: Secondary | ICD-10-CM | POA: Diagnosis not present

## 2015-06-13 DIAGNOSIS — N2581 Secondary hyperparathyroidism of renal origin: Secondary | ICD-10-CM | POA: Diagnosis not present

## 2015-06-13 DIAGNOSIS — E119 Type 2 diabetes mellitus without complications: Secondary | ICD-10-CM | POA: Diagnosis not present

## 2015-06-13 DIAGNOSIS — N186 End stage renal disease: Secondary | ICD-10-CM | POA: Diagnosis not present

## 2015-06-13 DIAGNOSIS — R300 Dysuria: Secondary | ICD-10-CM | POA: Diagnosis not present

## 2015-06-15 DIAGNOSIS — D509 Iron deficiency anemia, unspecified: Secondary | ICD-10-CM | POA: Diagnosis not present

## 2015-06-15 DIAGNOSIS — N2581 Secondary hyperparathyroidism of renal origin: Secondary | ICD-10-CM | POA: Diagnosis not present

## 2015-06-15 DIAGNOSIS — N186 End stage renal disease: Secondary | ICD-10-CM | POA: Diagnosis not present

## 2015-06-15 DIAGNOSIS — R300 Dysuria: Secondary | ICD-10-CM | POA: Diagnosis not present

## 2015-06-15 DIAGNOSIS — E119 Type 2 diabetes mellitus without complications: Secondary | ICD-10-CM | POA: Diagnosis not present

## 2015-06-17 DIAGNOSIS — N2581 Secondary hyperparathyroidism of renal origin: Secondary | ICD-10-CM | POA: Diagnosis not present

## 2015-06-17 DIAGNOSIS — E119 Type 2 diabetes mellitus without complications: Secondary | ICD-10-CM | POA: Diagnosis not present

## 2015-06-17 DIAGNOSIS — D509 Iron deficiency anemia, unspecified: Secondary | ICD-10-CM | POA: Diagnosis not present

## 2015-06-17 DIAGNOSIS — N186 End stage renal disease: Secondary | ICD-10-CM | POA: Diagnosis not present

## 2015-06-17 DIAGNOSIS — R300 Dysuria: Secondary | ICD-10-CM | POA: Diagnosis not present

## 2015-06-20 DIAGNOSIS — N2581 Secondary hyperparathyroidism of renal origin: Secondary | ICD-10-CM | POA: Diagnosis not present

## 2015-06-20 DIAGNOSIS — N186 End stage renal disease: Secondary | ICD-10-CM | POA: Diagnosis not present

## 2015-06-20 DIAGNOSIS — R300 Dysuria: Secondary | ICD-10-CM | POA: Diagnosis not present

## 2015-06-20 DIAGNOSIS — D509 Iron deficiency anemia, unspecified: Secondary | ICD-10-CM | POA: Diagnosis not present

## 2015-06-20 DIAGNOSIS — E119 Type 2 diabetes mellitus without complications: Secondary | ICD-10-CM | POA: Diagnosis not present

## 2015-06-22 DIAGNOSIS — D509 Iron deficiency anemia, unspecified: Secondary | ICD-10-CM | POA: Diagnosis not present

## 2015-06-22 DIAGNOSIS — N186 End stage renal disease: Secondary | ICD-10-CM | POA: Diagnosis not present

## 2015-06-22 DIAGNOSIS — R300 Dysuria: Secondary | ICD-10-CM | POA: Diagnosis not present

## 2015-06-22 DIAGNOSIS — E119 Type 2 diabetes mellitus without complications: Secondary | ICD-10-CM | POA: Diagnosis not present

## 2015-06-22 DIAGNOSIS — N2581 Secondary hyperparathyroidism of renal origin: Secondary | ICD-10-CM | POA: Diagnosis not present

## 2015-06-25 DIAGNOSIS — R300 Dysuria: Secondary | ICD-10-CM | POA: Diagnosis not present

## 2015-06-25 DIAGNOSIS — N186 End stage renal disease: Secondary | ICD-10-CM | POA: Diagnosis not present

## 2015-06-25 DIAGNOSIS — E119 Type 2 diabetes mellitus without complications: Secondary | ICD-10-CM | POA: Diagnosis not present

## 2015-06-25 DIAGNOSIS — N2581 Secondary hyperparathyroidism of renal origin: Secondary | ICD-10-CM | POA: Diagnosis not present

## 2015-06-25 DIAGNOSIS — D509 Iron deficiency anemia, unspecified: Secondary | ICD-10-CM | POA: Diagnosis not present

## 2015-06-27 ENCOUNTER — Encounter: Payer: Self-pay | Admitting: Vascular Surgery

## 2015-06-27 DIAGNOSIS — N2581 Secondary hyperparathyroidism of renal origin: Secondary | ICD-10-CM | POA: Diagnosis not present

## 2015-06-27 DIAGNOSIS — N186 End stage renal disease: Secondary | ICD-10-CM | POA: Diagnosis not present

## 2015-06-27 DIAGNOSIS — E119 Type 2 diabetes mellitus without complications: Secondary | ICD-10-CM | POA: Diagnosis not present

## 2015-06-27 DIAGNOSIS — R300 Dysuria: Secondary | ICD-10-CM | POA: Diagnosis not present

## 2015-06-27 DIAGNOSIS — D509 Iron deficiency anemia, unspecified: Secondary | ICD-10-CM | POA: Diagnosis not present

## 2015-06-29 ENCOUNTER — Encounter: Payer: Self-pay | Admitting: Vascular Surgery

## 2015-06-29 ENCOUNTER — Ambulatory Visit (INDEPENDENT_AMBULATORY_CARE_PROVIDER_SITE_OTHER): Payer: Medicare Other | Admitting: Vascular Surgery

## 2015-06-29 VITALS — BP 144/80 | HR 74 | Temp 98.7°F | Ht 72.0 in | Wt 225.6 lb

## 2015-06-29 DIAGNOSIS — N186 End stage renal disease: Secondary | ICD-10-CM | POA: Diagnosis not present

## 2015-06-29 DIAGNOSIS — N2581 Secondary hyperparathyroidism of renal origin: Secondary | ICD-10-CM | POA: Diagnosis not present

## 2015-06-29 DIAGNOSIS — E119 Type 2 diabetes mellitus without complications: Secondary | ICD-10-CM | POA: Diagnosis not present

## 2015-06-29 DIAGNOSIS — Z992 Dependence on renal dialysis: Secondary | ICD-10-CM | POA: Diagnosis not present

## 2015-06-29 DIAGNOSIS — D509 Iron deficiency anemia, unspecified: Secondary | ICD-10-CM | POA: Diagnosis not present

## 2015-06-29 DIAGNOSIS — R300 Dysuria: Secondary | ICD-10-CM | POA: Diagnosis not present

## 2015-06-29 DIAGNOSIS — I120 Hypertensive chronic kidney disease with stage 5 chronic kidney disease or end stage renal disease: Secondary | ICD-10-CM | POA: Diagnosis not present

## 2015-06-29 NOTE — Progress Notes (Signed)
   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  HPI: Jon [name] is a 59 y.o. 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 He had a very complex aneurysm of the fistula and the fistula was markedly tortuous. I was able to excise the large aneurysm and then reanastomose the vein and 2 and.  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 He continues to use his fistula for dialysis and has not had any problems with this.  He denies pain or paresthesias in his left arm.  Current Outpatient Prescriptions  Medication Sig Dispense Refill  . amLODipine (NORVASC) 10 MG tablet Take 10 mg by mouth at bedtime.    Marland Kitchen aspirin EC 81 MG EC tablet Take 1 tablet (81 mg total) by mouth daily. 30 tablet 3  . atorvastatin (LIPITOR) 20 MG tablet Take 1 tablet (20 mg total) by mouth daily at 6 PM. 30 tablet 3  . carvedilol (COREG) 25 MG tablet Take 25 mg by mouth 2 (two) times daily.    Marland Kitchen labetalol (NORMODYNE) 200 MG tablet Take 1 tablet (200 mg total) by mouth 2 (two) times daily. 60 tablet 0  . losartan (COZAAR) 100 MG tablet Take 1 tablet (100 mg total) by mouth at bedtime. 30 tablet 0  . multivitamin (RENA-VIT) TABS tablet Take 1 tablet by mouth at bedtime.    . nitroGLYCERIN (NITROSTAT) 0.4 MG SL tablet Place 1 tablet (0.4 mg total) under the tongue every 5 (five) minutes x 3 doses as needed for chest pain. 25 tablet 12  . oxyCODONE (ROXICODONE) 5 MG immediate release tablet Take 1 tablet (5 mg total) by mouth every 6 (six) hours as needed for moderate pain. 20 tablet 0  . spironolactone (ALDACTONE) 25 MG tablet Take 25 mg by mouth daily.     No current facility-administered medications for this visit.    REVIEW OF SYSTEMS:  [X]  denotes positive finding, [ ]   denotes negative finding Cardiac  Comments:  Chest pain or chest pressure:    Shortness of breath upon exertion:    Short of breath when lying flat:    Irregular heart rhythm:    Constitutional    Fever or chills:      PHYSICAL EXAM: Filed Vitals:   06/29/15 1537  BP: 144/80  Pulse: 74  Temp: 98.7 F (37.1 C)  TempSrc: Oral  Height: 6' (1.829 m)  Weight: 225 lb 9.6 oz (102.331 kg)  SpO2: 97%    GENERAL: The patient is a well-nourished male, in no acute distress. The vital signs are documented above. CARDIOVASCULAR: There is a regular rate and rhythm. PULMONARY: There is good air exchange bilaterally without wheezing or rales. His left upper arm fistula has an excellent bruit and thrill.  MEDICAL ISSUES: The patient is doing well status post 2 procedures to excise and plicate large aneurysms of his left upper arm fistula. The fistula is working well and I would continue to use this and try to get as much mileage out of this as possible. Eventually he will have to be considered for new access. I will see him back as needed.  Deitra Mayo [facility] [place]: [phone]

## 2015-07-01 DIAGNOSIS — N2581 Secondary hyperparathyroidism of renal origin: Secondary | ICD-10-CM | POA: Diagnosis not present

## 2015-07-01 DIAGNOSIS — N186 End stage renal disease: Secondary | ICD-10-CM | POA: Diagnosis not present

## 2015-07-01 DIAGNOSIS — D509 Iron deficiency anemia, unspecified: Secondary | ICD-10-CM | POA: Diagnosis not present

## 2015-07-04 DIAGNOSIS — D509 Iron deficiency anemia, unspecified: Secondary | ICD-10-CM | POA: Diagnosis not present

## 2015-07-04 DIAGNOSIS — N2581 Secondary hyperparathyroidism of renal origin: Secondary | ICD-10-CM | POA: Diagnosis not present

## 2015-07-04 DIAGNOSIS — N186 End stage renal disease: Secondary | ICD-10-CM | POA: Diagnosis not present

## 2015-07-07 DIAGNOSIS — D631 Anemia in chronic kidney disease: Secondary | ICD-10-CM | POA: Diagnosis not present

## 2015-07-07 DIAGNOSIS — N186 End stage renal disease: Secondary | ICD-10-CM | POA: Diagnosis not present

## 2015-07-07 DIAGNOSIS — N2581 Secondary hyperparathyroidism of renal origin: Secondary | ICD-10-CM | POA: Diagnosis not present

## 2015-07-07 DIAGNOSIS — D509 Iron deficiency anemia, unspecified: Secondary | ICD-10-CM | POA: Diagnosis not present

## 2015-07-09 DIAGNOSIS — D631 Anemia in chronic kidney disease: Secondary | ICD-10-CM | POA: Diagnosis not present

## 2015-07-09 DIAGNOSIS — D509 Iron deficiency anemia, unspecified: Secondary | ICD-10-CM | POA: Diagnosis not present

## 2015-07-09 DIAGNOSIS — N2581 Secondary hyperparathyroidism of renal origin: Secondary | ICD-10-CM | POA: Diagnosis not present

## 2015-07-09 DIAGNOSIS — N186 End stage renal disease: Secondary | ICD-10-CM | POA: Diagnosis not present

## 2015-07-12 DIAGNOSIS — D509 Iron deficiency anemia, unspecified: Secondary | ICD-10-CM | POA: Diagnosis not present

## 2015-07-12 DIAGNOSIS — N186 End stage renal disease: Secondary | ICD-10-CM | POA: Diagnosis not present

## 2015-07-12 DIAGNOSIS — D631 Anemia in chronic kidney disease: Secondary | ICD-10-CM | POA: Diagnosis not present

## 2015-07-12 DIAGNOSIS — N2581 Secondary hyperparathyroidism of renal origin: Secondary | ICD-10-CM | POA: Diagnosis not present

## 2015-07-14 DIAGNOSIS — N186 End stage renal disease: Secondary | ICD-10-CM | POA: Diagnosis not present

## 2015-07-14 DIAGNOSIS — D631 Anemia in chronic kidney disease: Secondary | ICD-10-CM | POA: Diagnosis not present

## 2015-07-14 DIAGNOSIS — N2581 Secondary hyperparathyroidism of renal origin: Secondary | ICD-10-CM | POA: Diagnosis not present

## 2015-07-14 DIAGNOSIS — D509 Iron deficiency anemia, unspecified: Secondary | ICD-10-CM | POA: Diagnosis not present

## 2015-07-16 DIAGNOSIS — D509 Iron deficiency anemia, unspecified: Secondary | ICD-10-CM | POA: Diagnosis not present

## 2015-07-16 DIAGNOSIS — N2581 Secondary hyperparathyroidism of renal origin: Secondary | ICD-10-CM | POA: Diagnosis not present

## 2015-07-16 DIAGNOSIS — N186 End stage renal disease: Secondary | ICD-10-CM | POA: Diagnosis not present

## 2015-07-16 DIAGNOSIS — D631 Anemia in chronic kidney disease: Secondary | ICD-10-CM | POA: Diagnosis not present

## 2015-07-19 DIAGNOSIS — D631 Anemia in chronic kidney disease: Secondary | ICD-10-CM | POA: Diagnosis not present

## 2015-07-19 DIAGNOSIS — D509 Iron deficiency anemia, unspecified: Secondary | ICD-10-CM | POA: Diagnosis not present

## 2015-07-19 DIAGNOSIS — N186 End stage renal disease: Secondary | ICD-10-CM | POA: Diagnosis not present

## 2015-07-19 DIAGNOSIS — N2581 Secondary hyperparathyroidism of renal origin: Secondary | ICD-10-CM | POA: Diagnosis not present

## 2015-07-20 DIAGNOSIS — N186 End stage renal disease: Secondary | ICD-10-CM | POA: Diagnosis not present

## 2015-07-20 DIAGNOSIS — D509 Iron deficiency anemia, unspecified: Secondary | ICD-10-CM | POA: Diagnosis not present

## 2015-07-20 DIAGNOSIS — N2581 Secondary hyperparathyroidism of renal origin: Secondary | ICD-10-CM | POA: Diagnosis not present

## 2015-07-21 DIAGNOSIS — N186 End stage renal disease: Secondary | ICD-10-CM | POA: Diagnosis not present

## 2015-07-21 DIAGNOSIS — D509 Iron deficiency anemia, unspecified: Secondary | ICD-10-CM | POA: Diagnosis not present

## 2015-07-21 DIAGNOSIS — D631 Anemia in chronic kidney disease: Secondary | ICD-10-CM | POA: Diagnosis not present

## 2015-07-21 DIAGNOSIS — N2581 Secondary hyperparathyroidism of renal origin: Secondary | ICD-10-CM | POA: Diagnosis not present

## 2015-07-23 DIAGNOSIS — N2581 Secondary hyperparathyroidism of renal origin: Secondary | ICD-10-CM | POA: Diagnosis not present

## 2015-07-23 DIAGNOSIS — N186 End stage renal disease: Secondary | ICD-10-CM | POA: Diagnosis not present

## 2015-07-23 DIAGNOSIS — D631 Anemia in chronic kidney disease: Secondary | ICD-10-CM | POA: Diagnosis not present

## 2015-07-23 DIAGNOSIS — D509 Iron deficiency anemia, unspecified: Secondary | ICD-10-CM | POA: Diagnosis not present

## 2015-07-26 DIAGNOSIS — N186 End stage renal disease: Secondary | ICD-10-CM | POA: Diagnosis not present

## 2015-07-26 DIAGNOSIS — N2581 Secondary hyperparathyroidism of renal origin: Secondary | ICD-10-CM | POA: Diagnosis not present

## 2015-07-26 DIAGNOSIS — D509 Iron deficiency anemia, unspecified: Secondary | ICD-10-CM | POA: Diagnosis not present

## 2015-07-27 DIAGNOSIS — N2581 Secondary hyperparathyroidism of renal origin: Secondary | ICD-10-CM | POA: Diagnosis not present

## 2015-07-27 DIAGNOSIS — N186 End stage renal disease: Secondary | ICD-10-CM | POA: Diagnosis not present

## 2015-07-27 DIAGNOSIS — D509 Iron deficiency anemia, unspecified: Secondary | ICD-10-CM | POA: Diagnosis not present

## 2015-07-29 DIAGNOSIS — D509 Iron deficiency anemia, unspecified: Secondary | ICD-10-CM | POA: Diagnosis not present

## 2015-07-29 DIAGNOSIS — N186 End stage renal disease: Secondary | ICD-10-CM | POA: Diagnosis not present

## 2015-07-29 DIAGNOSIS — N2581 Secondary hyperparathyroidism of renal origin: Secondary | ICD-10-CM | POA: Diagnosis not present

## 2015-07-30 DIAGNOSIS — N186 End stage renal disease: Secondary | ICD-10-CM | POA: Diagnosis not present

## 2015-07-30 DIAGNOSIS — I120 Hypertensive chronic kidney disease with stage 5 chronic kidney disease or end stage renal disease: Secondary | ICD-10-CM | POA: Diagnosis not present

## 2015-07-30 DIAGNOSIS — Z992 Dependence on renal dialysis: Secondary | ICD-10-CM | POA: Diagnosis not present

## 2015-08-01 DIAGNOSIS — N2581 Secondary hyperparathyroidism of renal origin: Secondary | ICD-10-CM | POA: Diagnosis not present

## 2015-08-01 DIAGNOSIS — D509 Iron deficiency anemia, unspecified: Secondary | ICD-10-CM | POA: Diagnosis not present

## 2015-08-01 DIAGNOSIS — E119 Type 2 diabetes mellitus without complications: Secondary | ICD-10-CM | POA: Diagnosis not present

## 2015-08-01 DIAGNOSIS — N186 End stage renal disease: Secondary | ICD-10-CM | POA: Diagnosis not present

## 2015-08-03 DIAGNOSIS — N186 End stage renal disease: Secondary | ICD-10-CM | POA: Diagnosis not present

## 2015-08-03 DIAGNOSIS — E119 Type 2 diabetes mellitus without complications: Secondary | ICD-10-CM | POA: Diagnosis not present

## 2015-08-03 DIAGNOSIS — D509 Iron deficiency anemia, unspecified: Secondary | ICD-10-CM | POA: Diagnosis not present

## 2015-08-03 DIAGNOSIS — N2581 Secondary hyperparathyroidism of renal origin: Secondary | ICD-10-CM | POA: Diagnosis not present

## 2015-08-05 DIAGNOSIS — D509 Iron deficiency anemia, unspecified: Secondary | ICD-10-CM | POA: Diagnosis not present

## 2015-08-05 DIAGNOSIS — N2581 Secondary hyperparathyroidism of renal origin: Secondary | ICD-10-CM | POA: Diagnosis not present

## 2015-08-05 DIAGNOSIS — E119 Type 2 diabetes mellitus without complications: Secondary | ICD-10-CM | POA: Diagnosis not present

## 2015-08-05 DIAGNOSIS — N186 End stage renal disease: Secondary | ICD-10-CM | POA: Diagnosis not present

## 2015-08-08 DIAGNOSIS — N2581 Secondary hyperparathyroidism of renal origin: Secondary | ICD-10-CM | POA: Diagnosis not present

## 2015-08-08 DIAGNOSIS — D509 Iron deficiency anemia, unspecified: Secondary | ICD-10-CM | POA: Diagnosis not present

## 2015-08-08 DIAGNOSIS — E119 Type 2 diabetes mellitus without complications: Secondary | ICD-10-CM | POA: Diagnosis not present

## 2015-08-08 DIAGNOSIS — N186 End stage renal disease: Secondary | ICD-10-CM | POA: Diagnosis not present

## 2015-08-10 ENCOUNTER — Encounter: Payer: Self-pay | Admitting: Vascular Surgery

## 2015-08-10 ENCOUNTER — Other Ambulatory Visit: Payer: Self-pay

## 2015-08-10 ENCOUNTER — Ambulatory Visit (INDEPENDENT_AMBULATORY_CARE_PROVIDER_SITE_OTHER): Payer: Medicare Other | Admitting: Vascular Surgery

## 2015-08-10 VITALS — BP 155/98 | HR 89 | Ht 72.0 in | Wt 225.9 lb

## 2015-08-10 DIAGNOSIS — N186 End stage renal disease: Secondary | ICD-10-CM | POA: Diagnosis not present

## 2015-08-10 DIAGNOSIS — N2581 Secondary hyperparathyroidism of renal origin: Secondary | ICD-10-CM | POA: Diagnosis not present

## 2015-08-10 DIAGNOSIS — N185 Chronic kidney disease, stage 5: Secondary | ICD-10-CM | POA: Diagnosis not present

## 2015-08-10 DIAGNOSIS — D509 Iron deficiency anemia, unspecified: Secondary | ICD-10-CM | POA: Diagnosis not present

## 2015-08-10 DIAGNOSIS — E119 Type 2 diabetes mellitus without complications: Secondary | ICD-10-CM | POA: Diagnosis not present

## 2015-08-10 NOTE — Progress Notes (Signed)
[facility] of [facility]  Patient name: Jon [name] MRN: [id number] DOB: [birth year] Sex: male  REASON FOR VISIT: Pulsatile mass left axilla. Referred by Dr. Joelyn Oms.  HPI: Jon [name] is a 60 y.o. male who has had a large aneurysmal left brachiocephalic fistula for some time. On 03/08/2015, I performed excision of 2 large aneurysms of his left brachiocephalic AV fistula. 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 When I saw him last on 06/29/2015, the fistula was working well and I felt it would be worth continuing to use this as long as possible although ultimately he will need new access given how aneurysmal this fistula is.  He was referred to our office today because of a pulsatile mass in his left axilla. He denies fever or chills. He states that his fistula worked well today during dialysis.  He states that intermittently the mass in the axilla is painful.  Past Medical History  Diagnosis Date  . Dialysis patient ([place])   . Chronic kidney disease   . GERD (gastroesophageal reflux disease)   . Myocardial infarction [facility])     lived in [place] maybe 7 years ago  . History of blood transfusion   . Hypertension     takes Amlodipine,Losartan,and Labetalol daily  . Hyperlipidemia     takes Atorvastatin daily    Family History  Problem Relation Age of Onset  . Hypertension Mother   . Other Mother     varicose veins  . Diabetes Father   . Hypertension Father   . Hypertension Sister   . Other Sister     varicose veins  . Other Brother     varicose veins    SOCIAL HISTORY: Social History  Substance Use Topics  . Smoking status: Never Smoker   . Smokeless tobacco: Never Used  . Alcohol Use: No    No Known Allergies  Current Outpatient Prescriptions  Medication Sig Dispense Refill  . amLODipine (NORVASC) 10 MG tablet Take 10 mg by mouth at bedtime.    Marland Kitchen aspirin EC 81 MG EC tablet Take 1  tablet (81 mg total) by mouth daily. 30 tablet 3  . atorvastatin (LIPITOR) 20 MG tablet Take 1 tablet (20 mg total) by mouth daily at 6 PM. 30 tablet 3  . carvedilol (COREG) 25 MG tablet Take 25 mg by mouth 2 (two) times daily.    Marland Kitchen labetalol (NORMODYNE) 200 MG tablet Take 1 tablet (200 mg total) by mouth 2 (two) times daily. 60 tablet 0  . losartan (COZAAR) 100 MG tablet Take 1 tablet (100 mg total) by mouth at bedtime. 30 tablet 0  . multivitamin (RENA-VIT) TABS tablet Take 1 tablet by mouth at bedtime.    . nitroGLYCERIN (NITROSTAT) 0.4 MG SL tablet Place 1 tablet (0.4 mg total) under the tongue every 5 (five) minutes x 3 doses as needed for chest pain. 25 tablet 12  . oxyCODONE (ROXICODONE) 5 MG immediate release tablet Take 1 tablet (5 mg total) by mouth every 6 (six) hours as needed for moderate pain. 20 tablet 0  . spironolactone (ALDACTONE) 25 MG tablet Take 25 mg by mouth daily.     No current facility-administered medications for this visit.    REVIEW OF SYSTEMS:  [X]  denotes positive finding, [ ]  denotes negative finding Cardiac  Comments:  Chest pain or chest pressure:    Shortness of breath upon exertion:    Short of breath when  lying flat:    Irregular heart rhythm:        Vascular    Pain in calf, thigh, or hip brought on by ambulation:    Pain in feet at night that wakes you up from your sleep:     Blood clot in your veins:    Leg swelling:         Pulmonary    Oxygen at home:    Productive cough:     Wheezing:         Neurologic    Sudden weakness in arms or legs:     Sudden numbness in arms or legs:     Sudden onset of difficulty speaking or slurred speech:    Temporary loss of vision in one eye:     Problems with dizziness:         Gastrointestinal    Blood in stool:     Vomited blood:         Genitourinary    Burning when urinating:     Blood in urine:        Psychiatric    Major depression:         Hematologic    Bleeding problems:    Problems  with blood clotting too easily:        Skin    Rashes or ulcers:        Constitutional    Fever or chills:      PHYSICAL EXAM: Filed Vitals:   08/10/15 1201 08/10/15 1203  BP: 156/92 155/98  Pulse: 89   Height: 6' (1.829 m)   Weight: 225 lb 14.4 oz (102.468 kg)   SpO2: 98%     GENERAL: The patient is a well-nourished male, in no acute distress. The vital signs are documented above. CARDIAC: There is a regular rate and rhythm.  VASCULAR: the fistula is pulsatile and aneurysmal. The mass in his axilla is pulsatile and I think this simply represents his fistula. I do not see any fluctuance or erythema to suggest infection or abscess. PULMONARY: There is good air exchange bilaterally without wheezing or rales. ABDOMEN: Soft and non-tender with normal pitched bowel sounds.  MUSCULOSKELETAL: There are no major deformities or cyanosis. NEUROLOGIC: No focal weakness or paresthesias are detected. SKIN: There are no ulcers or rashes noted. PSYCHIATRIC: The patient has a normal affect.   MEDICAL ISSUES:  PULSATILE MASS LEFT AXILLA: The pulsatile mass in his left axilla is simply his fistula. However given that this is tender at times I have recommended that we proceed with a fistulogram to see if he has a central venous stenosis which might be causing his fistula to be under higher pressures which might be partly responsible for his pain. For this reason, I have recommended that we proceed with a fistulogram to look for a central venous stenosis which could be addressed with venoplasty if possible. This might potentially help with his pain. I reassured him that I did not see any evidence of infection at this point. We will make further recommendations pending the results of his fistulogram. He dialyzes on Monday Wednesdays and Fridays so we'll have to do this on a Tuesday or Thursday.  HYPERTENSION: The patient's initial blood pressure today was elevated. We repeated this and this was still  elevated. We have encouraged the patient to follow up with their primary care physician for management of their blood pressure.  Deitra Mayo [facility] [place]: [phone]

## 2015-08-12 DIAGNOSIS — D509 Iron deficiency anemia, unspecified: Secondary | ICD-10-CM | POA: Diagnosis not present

## 2015-08-12 DIAGNOSIS — E119 Type 2 diabetes mellitus without complications: Secondary | ICD-10-CM | POA: Diagnosis not present

## 2015-08-12 DIAGNOSIS — N186 End stage renal disease: Secondary | ICD-10-CM | POA: Diagnosis not present

## 2015-08-12 DIAGNOSIS — N2581 Secondary hyperparathyroidism of renal origin: Secondary | ICD-10-CM | POA: Diagnosis not present

## 2015-08-15 DIAGNOSIS — N186 End stage renal disease: Secondary | ICD-10-CM | POA: Diagnosis not present

## 2015-08-15 DIAGNOSIS — N2581 Secondary hyperparathyroidism of renal origin: Secondary | ICD-10-CM | POA: Diagnosis not present

## 2015-08-15 DIAGNOSIS — E119 Type 2 diabetes mellitus without complications: Secondary | ICD-10-CM | POA: Diagnosis not present

## 2015-08-15 DIAGNOSIS — D509 Iron deficiency anemia, unspecified: Secondary | ICD-10-CM | POA: Diagnosis not present

## 2015-08-17 DIAGNOSIS — D509 Iron deficiency anemia, unspecified: Secondary | ICD-10-CM | POA: Diagnosis not present

## 2015-08-17 DIAGNOSIS — N2581 Secondary hyperparathyroidism of renal origin: Secondary | ICD-10-CM | POA: Diagnosis not present

## 2015-08-17 DIAGNOSIS — N186 End stage renal disease: Secondary | ICD-10-CM | POA: Diagnosis not present

## 2015-08-17 DIAGNOSIS — E119 Type 2 diabetes mellitus without complications: Secondary | ICD-10-CM | POA: Diagnosis not present

## 2015-08-18 ENCOUNTER — Encounter (HOSPITAL_COMMUNITY)
Admission: RE | Disposition: A | Discharge status: Home / Self Care | Payer: Medicare Other | Source: Ambulatory Visit | Attending: Vascular Surgery

## 2015-08-18 ENCOUNTER — Ambulatory Visit (HOSPITAL_COMMUNITY)
Admission: RE | Admit: 2015-08-18 | Discharge: 2015-08-18 | Disposition: A | Discharge status: Home / Self Care | Payer: Medicare Other | Source: Ambulatory Visit | Attending: Vascular Surgery | Admitting: Vascular Surgery

## 2015-08-18 ENCOUNTER — Other Ambulatory Visit: Payer: Self-pay | Admitting: *Deleted

## 2015-08-18 DIAGNOSIS — E785 Hyperlipidemia, unspecified: Secondary | ICD-10-CM | POA: Insufficient documentation

## 2015-08-18 DIAGNOSIS — N186 End stage renal disease: Secondary | ICD-10-CM | POA: Insufficient documentation

## 2015-08-18 DIAGNOSIS — Z8249 Family history of ischemic heart disease and other diseases of the circulatory system: Secondary | ICD-10-CM | POA: Insufficient documentation

## 2015-08-18 DIAGNOSIS — I120 Hypertensive chronic kidney disease with stage 5 chronic kidney disease or end stage renal disease: Secondary | ICD-10-CM | POA: Diagnosis not present

## 2015-08-18 DIAGNOSIS — Y832 Surgical operation with anastomosis, bypass or graft as the cause of abnormal reaction of the patient, or of later complication, without mention of misadventure at the time of the procedure: Secondary | ICD-10-CM | POA: Insufficient documentation

## 2015-08-18 DIAGNOSIS — I252 Old myocardial infarction: Secondary | ICD-10-CM | POA: Insufficient documentation

## 2015-08-18 DIAGNOSIS — K219 Gastro-esophageal reflux disease without esophagitis: Secondary | ICD-10-CM | POA: Insufficient documentation

## 2015-08-18 DIAGNOSIS — T82898A Other specified complication of vascular prosthetic devices, implants and grafts, initial encounter: Secondary | ICD-10-CM | POA: Insufficient documentation

## 2015-08-18 DIAGNOSIS — Z7982 Long term (current) use of aspirin: Secondary | ICD-10-CM | POA: Diagnosis not present

## 2015-08-18 DIAGNOSIS — Z01810 Encounter for preprocedural cardiovascular examination: Secondary | ICD-10-CM

## 2015-08-18 DIAGNOSIS — Z992 Dependence on renal dialysis: Secondary | ICD-10-CM | POA: Insufficient documentation

## 2015-08-18 HISTORY — PX: PERIPHERAL VASCULAR CATHETERIZATION: SHX172C

## 2015-08-18 LAB — POCT I-STAT, CHEM 8
BUN: 27 mg/dL — ABNORMAL HIGH (ref 6–20)
CREATININE: 11.3 mg/dL — AB (ref 0.61–1.24)
Calcium, Ion: 1.04 mmol/L — ABNORMAL LOW (ref 1.12–1.23)
Chloride: 95 mmol/L — ABNORMAL LOW (ref 101–111)
GLUCOSE: 76 mg/dL (ref 65–99)
HCT: 42 % (ref 39.0–52.0)
Hemoglobin: 14.3 g/dL (ref 13.0–17.0)
Potassium: 4.3 mmol/L (ref 3.5–5.1)
SODIUM: 138 mmol/L (ref 135–145)
TCO2: 32 mmol/L (ref 0–100)

## 2015-08-18 SURGERY — A/V SHUNTOGRAM/FISTULAGRAM
Anesthesia: LOCAL

## 2015-08-18 MED ORDER — SODIUM CHLORIDE 0.9 % IJ SOLN
3.0000 mL | INTRAMUSCULAR | Status: DC | PRN
  Administered 2015-08-18: 3 mL via INTRAVENOUS
  Filled 2015-08-18: qty 3

## 2015-08-18 MED ORDER — ACETAMINOPHEN 325 MG PO TABS: 650.0000 mg | ORAL_TABLET | ORAL | Status: DC | PRN

## 2015-08-18 MED ORDER — SODIUM CHLORIDE 0.9 % IJ SOLN: 3.0000 mL | INTRAMUSCULAR | Status: DC | PRN

## 2015-08-18 MED ORDER — HYDRALAZINE HCL 20 MG/ML IJ SOLN
10.0000 mg | INTRAMUSCULAR | Status: DC | PRN
Start: 2015-08-18 — End: 2015-08-18

## 2015-08-18 MED ORDER — HEPARIN (PORCINE) IN NACL 2-0.9 UNIT/ML-% IJ SOLN
INTRAMUSCULAR | Status: AC
  Filled 2015-08-18: qty 500

## 2015-08-18 MED ORDER — SODIUM CHLORIDE 0.9 % IJ SOLN: 3.0000 mL | Freq: Two times a day (BID) | INTRAMUSCULAR | Status: DC

## 2015-08-18 MED ORDER — LIDOCAINE HCL (PF) 1 % IJ SOLN
INTRAMUSCULAR | Status: AC
  Filled 2015-08-18: qty 30

## 2015-08-18 MED ORDER — IODIXANOL 320 MG/ML IV SOLN
INTRAVENOUS | Status: DC | PRN
  Administered 2015-08-18: 55 mL via INTRAVENOUS

## 2015-08-18 MED ORDER — LIDOCAINE HCL (PF) 1 % IJ SOLN
INTRAMUSCULAR | Status: DC | PRN
  Administered 2015-08-18: 11:00:00

## 2015-08-18 MED ORDER — SODIUM CHLORIDE 0.9 % IV SOLN: 250.0000 mL | INTRAVENOUS | Status: DC | PRN

## 2015-08-18 SURGICAL SUPPLY — 11 items
BAG SNAP BAND KOVER 36X36 (MISCELLANEOUS) ×2 IMPLANT
COVER DOME SNAP 22 D (MISCELLANEOUS) ×2 IMPLANT
COVER PRB 48X5XTLSCP FOLD TPE (BAG) ×1 IMPLANT
COVER PROBE 5X48 (BAG) ×1
KIT MICROINTRODUCER STIFF 5F (SHEATH) ×2 IMPLANT
PROTECTION STATION PRESSURIZED (MISCELLANEOUS) ×2
STATION PROTECTION PRESSURIZED (MISCELLANEOUS) ×1 IMPLANT
STOPCOCK MORSE 400PSI 3WAY (MISCELLANEOUS) ×2 IMPLANT
TRAY PV CATH (CUSTOM PROCEDURE TRAY) ×2 IMPLANT
TUBING CIL FLEX 10 FLL-RA (TUBING) ×2 IMPLANT
WIRE BENTSON .035X145CM (WIRE) ×2 IMPLANT

## 2015-08-18 NOTE — Discharge Instructions (Signed)
Fistulogram, Care After °Refer to this sheet in the next few weeks. These instructions provide you with information on caring for yourself after your procedure. Your health care provider may also give you more specific instructions. Your treatment has been planned according to current medical practices, but problems sometimes occur. Call your health care provider if you have any problems or questions after your procedure. °WHAT TO EXPECT AFTER THE PROCEDURE °After your procedure, it is typical to have the following: °· A small amount of discomfort in the area where the catheters were placed. °· A small amount of bruising around the fistula. °· Sleepiness and fatigue. °HOME CARE INSTRUCTIONS °· Rest at home for the day following your procedure. °· Do not drive or operate heavy machinery while taking pain medicine. °· Take medicines only as directed by your health care provider. °· Do not take baths, swim, or use a hot tub until your health care provider approves. You may shower 24 hours after the procedure or as directed by your health care provider. °· There are many different ways to close and cover an incision, including stitches, skin glue, and adhesive strips. Follow your health care provider's instructions on: °¨ Incision care. °¨ Bandage (dressing) changes and removal. °¨ Incision closure removal. °· Monitor your dialysis fistula carefully. °SEEK MEDICAL CARE IF: °· You have drainage, redness, swelling, or pain at your catheter site. °· You have a fever. °· You have chills. °SEEK IMMEDIATE MEDICAL CARE IF: °· You feel weak. °· You have trouble balancing. °· You have trouble moving your arms or legs. °· You have problems with your speech or vision. °· You can no longer feel a vibration or buzz when you put your fingers over your dialysis fistula. °· The limb that was used for the procedure: °¨ Swells. °¨ Is painful. °¨ Is cold. °¨ Is discolored, such as blue or pale white. °  °This information is not intended  to replace advice given to you by your health care provider. Make sure you discuss any questions you have with your health care provider. °  °Document Released: 11/30/2013 Document Reviewed: 11/30/2013 °Elsevier Interactive Patient Education ©2016 [employer] ° °

## 2015-08-18 NOTE — H&P (View-Only) (Signed)
[facility] and Vein Specialist of [facility]  Patient name: Jon [name] MRN: [id number] DOB: January 02, 1956 Sex: male  REASON FOR VISIT: Pulsatile mass left axilla. Referred by Dr. Joelyn Oms.  HPI: Jon [name] is a 60 y.o. male who has had a large aneurysmal left brachiocephalic fistula for some time. On 03/08/2015, I performed excision of 2 large aneurysms of his left brachiocephalic AV fistula. 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 When I saw him last on 06/29/2015, the fistula was working well and I felt it would be worth continuing to use this as long as possible although ultimately he will need new access given how aneurysmal this fistula is.  He was referred to our office today because of a pulsatile mass in his left axilla. He denies fever or chills. He states that his fistula worked well today during dialysis.  He states that intermittently the mass in the axilla is painful.  Past Medical History  Diagnosis Date  . Dialysis patient ([place])   . Chronic kidney disease   . GERD (gastroesophageal reflux disease)   . Myocardial infarction [facility])     lived in [place] maybe 7 years ago  . History of blood transfusion   . Hypertension     takes Amlodipine,Losartan,and Labetalol daily  . Hyperlipidemia     takes Atorvastatin daily    Family History  Problem Relation Age of Onset  . Hypertension Mother   . Other Mother     varicose veins  . Diabetes Father   . Hypertension Father   . Hypertension Sister   . Other Sister     varicose veins  . Other Brother     varicose veins    SOCIAL HISTORY: Social History  Substance Use Topics  . Smoking status: Never Smoker   . Smokeless tobacco: Never Used  . Alcohol Use: No    No Known Allergies  Current Outpatient Prescriptions  Medication Sig Dispense Refill  . amLODipine (NORVASC) 10 MG tablet Take 10 mg by mouth at bedtime.    Marland Kitchen aspirin EC 81 MG EC tablet Take 1  tablet (81 mg total) by mouth daily. 30 tablet 3  . atorvastatin (LIPITOR) 20 MG tablet Take 1 tablet (20 mg total) by mouth daily at 6 PM. 30 tablet 3  . carvedilol (COREG) 25 MG tablet Take 25 mg by mouth 2 (two) times daily.    Marland Kitchen labetalol (NORMODYNE) 200 MG tablet Take 1 tablet (200 mg total) by mouth 2 (two) times daily. 60 tablet 0  . losartan (COZAAR) 100 MG tablet Take 1 tablet (100 mg total) by mouth at bedtime. 30 tablet 0  . multivitamin (RENA-VIT) TABS tablet Take 1 tablet by mouth at bedtime.    . nitroGLYCERIN (NITROSTAT) 0.4 MG SL tablet Place 1 tablet (0.4 mg total) under the tongue every 5 (five) minutes x 3 doses as needed for chest pain. 25 tablet 12  . oxyCODONE (ROXICODONE) 5 MG immediate release tablet Take 1 tablet (5 mg total) by mouth every 6 (six) hours as needed for moderate pain. 20 tablet 0  . spironolactone (ALDACTONE) 25 MG tablet Take 25 mg by mouth daily.     No current facility-administered medications for this visit.    REVIEW OF SYSTEMS:  [X]  denotes positive finding, [ ]  denotes negative finding Cardiac  Comments:  Chest pain or chest pressure:    Shortness of breath upon exertion:    Short of breath when  lying flat:    Irregular heart rhythm:        Vascular    Pain in calf, thigh, or hip brought on by ambulation:    Pain in feet at night that wakes you up from your sleep:     Blood clot in your veins:    Leg swelling:         Pulmonary    Oxygen at home:    Productive cough:     Wheezing:         Neurologic    Sudden weakness in arms or legs:     Sudden numbness in arms or legs:     Sudden onset of difficulty speaking or slurred speech:    Temporary loss of vision in one eye:     Problems with dizziness:         Gastrointestinal    Blood in stool:     Vomited blood:         Genitourinary    Burning when urinating:     Blood in urine:        Psychiatric    Major depression:         Hematologic    Bleeding problems:    Problems  with blood clotting too easily:        Skin    Rashes or ulcers:        Constitutional    Fever or chills:      PHYSICAL EXAM: Filed Vitals:   08/10/15 1201 08/10/15 1203  BP: 156/92 155/98  Pulse: 89   Height: 6' (1.829 m)   Weight: 225 lb 14.4 oz (102.468 kg)   SpO2: 98%     GENERAL: The patient is a well-nourished male, in no acute distress. The vital signs are documented above. CARDIAC: There is a regular rate and rhythm.  VASCULAR: the fistula is pulsatile and aneurysmal. The mass in his axilla is pulsatile and I think this simply represents his fistula. I do not see any fluctuance or erythema to suggest infection or abscess. PULMONARY: There is good air exchange bilaterally without wheezing or rales. ABDOMEN: Soft and non-tender with normal pitched bowel sounds.  MUSCULOSKELETAL: There are no major deformities or cyanosis. NEUROLOGIC: No focal weakness or paresthesias are detected. SKIN: There are no ulcers or rashes noted. PSYCHIATRIC: The patient has a normal affect.   MEDICAL ISSUES:  PULSATILE MASS LEFT AXILLA: The pulsatile mass in his left axilla is simply his fistula. However given that this is tender at times I have recommended that we proceed with a fistulogram to see if he has a central venous stenosis which might be causing his fistula to be under higher pressures which might be partly responsible for his pain. For this reason, I have recommended that we proceed with a fistulogram to look for a central venous stenosis which could be addressed with venoplasty if possible. This might potentially help with his pain. I reassured him that I did not see any evidence of infection at this point. We will make further recommendations pending the results of his fistulogram. He dialyzes on Monday Wednesdays and Fridays so we'll have to do this on a Tuesday or Thursday.  HYPERTENSION: The patient's initial blood pressure today was elevated. We repeated this and this was still  elevated. We have encouraged the patient to follow up with their primary care physician for management of their blood pressure.  Deitra Mayo [facility] [place]: [phone]

## 2015-08-18 NOTE — Interval H&P Note (Signed)
[facility] of [place]  History and Physical Update  The patient was interviewed and re-examined.  The patient's previous History and Physical has been reviewed and is unchanged from Dr. Scot Dock consult.  There is no change in the plan of care: L fistulogram, possible intervention.  I discussed with the patient the nature of angiographic procedures, especially the limited patencies of any endovascular intervention.  The patient is aware of that the risks of an angiographic procedure include but are not limited to: bleeding, infection, access site complications, renal failure, embolization, rupture of vessel, dissection, possible need for emergent surgical intervention, possible need for surgical procedures to treat the patient's pathology, anaphylactic reaction to contrast, and stroke and death.  The patient is aware of the risks and agrees to proceed.    Adele Barthel, MD [facility] [place] Office: [phone] Pager: [phone]  08/18/2015, 9:21 AM

## 2015-08-19 ENCOUNTER — Encounter (HOSPITAL_COMMUNITY): Payer: Self-pay | Admitting: Vascular Surgery

## 2015-08-19 ENCOUNTER — Telehealth: Payer: Self-pay | Admitting: Vascular Surgery

## 2015-08-19 DIAGNOSIS — D509 Iron deficiency anemia, unspecified: Secondary | ICD-10-CM | POA: Diagnosis not present

## 2015-08-19 DIAGNOSIS — N2581 Secondary hyperparathyroidism of renal origin: Secondary | ICD-10-CM | POA: Diagnosis not present

## 2015-08-19 DIAGNOSIS — N186 End stage renal disease: Secondary | ICD-10-CM | POA: Diagnosis not present

## 2015-08-19 DIAGNOSIS — E119 Type 2 diabetes mellitus without complications: Secondary | ICD-10-CM | POA: Diagnosis not present

## 2015-08-19 MED FILL — Lidocaine HCl Local Preservative Free (PF) Inj 1%: INTRAMUSCULAR | Qty: 30 | Status: AC

## 2015-08-19 NOTE — Telephone Encounter (Signed)
-----   Message from Mena Goes, RN sent at 08/18/2015 10:53 AM EST ----- Regarding: schedule   ----- Message -----    From: Conrad Montverde, MD    Sent: 08/18/2015  10:42 AM      To: Vvs Charge Pool  [employer] [id number] 01/29/1956  Procedure:  L BC AVF cannulation L arm fistulogram  Follow-up: 2-4 week with Dr. Annamarie Major) for follow-up: L arm venous duplex to evaluate patency of left axillary and subclavian veins for use in a cephalic vein turndown

## 2015-08-19 NOTE — Telephone Encounter (Signed)
LM for pt re appts, dpm

## 2015-08-22 DIAGNOSIS — N2581 Secondary hyperparathyroidism of renal origin: Secondary | ICD-10-CM | POA: Diagnosis not present

## 2015-08-22 DIAGNOSIS — N186 End stage renal disease: Secondary | ICD-10-CM | POA: Diagnosis not present

## 2015-08-22 DIAGNOSIS — D509 Iron deficiency anemia, unspecified: Secondary | ICD-10-CM | POA: Diagnosis not present

## 2015-08-22 DIAGNOSIS — E119 Type 2 diabetes mellitus without complications: Secondary | ICD-10-CM | POA: Diagnosis not present

## 2015-08-24 DIAGNOSIS — N2581 Secondary hyperparathyroidism of renal origin: Secondary | ICD-10-CM | POA: Diagnosis not present

## 2015-08-24 DIAGNOSIS — N186 End stage renal disease: Secondary | ICD-10-CM | POA: Diagnosis not present

## 2015-08-24 DIAGNOSIS — E119 Type 2 diabetes mellitus without complications: Secondary | ICD-10-CM | POA: Diagnosis not present

## 2015-08-24 DIAGNOSIS — D509 Iron deficiency anemia, unspecified: Secondary | ICD-10-CM | POA: Diagnosis not present

## 2015-08-26 DIAGNOSIS — N2581 Secondary hyperparathyroidism of renal origin: Secondary | ICD-10-CM | POA: Diagnosis not present

## 2015-08-26 DIAGNOSIS — E119 Type 2 diabetes mellitus without complications: Secondary | ICD-10-CM | POA: Diagnosis not present

## 2015-08-26 DIAGNOSIS — N186 End stage renal disease: Secondary | ICD-10-CM | POA: Diagnosis not present

## 2015-08-26 DIAGNOSIS — D509 Iron deficiency anemia, unspecified: Secondary | ICD-10-CM | POA: Diagnosis not present

## 2015-08-29 DIAGNOSIS — D509 Iron deficiency anemia, unspecified: Secondary | ICD-10-CM | POA: Diagnosis not present

## 2015-08-29 DIAGNOSIS — E119 Type 2 diabetes mellitus without complications: Secondary | ICD-10-CM | POA: Diagnosis not present

## 2015-08-29 DIAGNOSIS — N186 End stage renal disease: Secondary | ICD-10-CM | POA: Diagnosis not present

## 2015-08-29 DIAGNOSIS — N2581 Secondary hyperparathyroidism of renal origin: Secondary | ICD-10-CM | POA: Diagnosis not present

## 2015-08-30 DIAGNOSIS — I120 Hypertensive chronic kidney disease with stage 5 chronic kidney disease or end stage renal disease: Secondary | ICD-10-CM | POA: Diagnosis not present

## 2015-08-30 DIAGNOSIS — Z992 Dependence on renal dialysis: Secondary | ICD-10-CM | POA: Diagnosis not present

## 2015-08-30 DIAGNOSIS — N186 End stage renal disease: Secondary | ICD-10-CM | POA: Diagnosis not present

## 2015-08-31 DIAGNOSIS — N186 End stage renal disease: Secondary | ICD-10-CM | POA: Diagnosis not present

## 2015-08-31 DIAGNOSIS — D509 Iron deficiency anemia, unspecified: Secondary | ICD-10-CM | POA: Diagnosis not present

## 2015-08-31 DIAGNOSIS — N2581 Secondary hyperparathyroidism of renal origin: Secondary | ICD-10-CM | POA: Diagnosis not present

## 2015-08-31 DIAGNOSIS — E119 Type 2 diabetes mellitus without complications: Secondary | ICD-10-CM | POA: Diagnosis not present

## 2015-08-31 DIAGNOSIS — E1029 Type 1 diabetes mellitus with other diabetic kidney complication: Secondary | ICD-10-CM | POA: Diagnosis not present

## 2015-09-02 ENCOUNTER — Encounter: Payer: Self-pay | Admitting: Vascular Surgery

## 2015-09-02 DIAGNOSIS — E119 Type 2 diabetes mellitus without complications: Secondary | ICD-10-CM | POA: Diagnosis not present

## 2015-09-02 DIAGNOSIS — N186 End stage renal disease: Secondary | ICD-10-CM | POA: Diagnosis not present

## 2015-09-02 DIAGNOSIS — D509 Iron deficiency anemia, unspecified: Secondary | ICD-10-CM | POA: Diagnosis not present

## 2015-09-02 DIAGNOSIS — E1029 Type 1 diabetes mellitus with other diabetic kidney complication: Secondary | ICD-10-CM | POA: Diagnosis not present

## 2015-09-02 DIAGNOSIS — N2581 Secondary hyperparathyroidism of renal origin: Secondary | ICD-10-CM | POA: Diagnosis not present

## 2015-09-05 ENCOUNTER — Encounter (HOSPITAL_COMMUNITY): Payer: Medicare Other

## 2015-09-05 DIAGNOSIS — N2581 Secondary hyperparathyroidism of renal origin: Secondary | ICD-10-CM | POA: Diagnosis not present

## 2015-09-05 DIAGNOSIS — D509 Iron deficiency anemia, unspecified: Secondary | ICD-10-CM | POA: Diagnosis not present

## 2015-09-05 DIAGNOSIS — E119 Type 2 diabetes mellitus without complications: Secondary | ICD-10-CM | POA: Diagnosis not present

## 2015-09-05 DIAGNOSIS — N186 End stage renal disease: Secondary | ICD-10-CM | POA: Diagnosis not present

## 2015-09-05 DIAGNOSIS — E1029 Type 1 diabetes mellitus with other diabetic kidney complication: Secondary | ICD-10-CM | POA: Diagnosis not present

## 2015-09-07 ENCOUNTER — Encounter: Payer: Medicare Other | Admitting: Vascular Surgery

## 2015-09-07 DIAGNOSIS — D509 Iron deficiency anemia, unspecified: Secondary | ICD-10-CM | POA: Diagnosis not present

## 2015-09-07 DIAGNOSIS — N2581 Secondary hyperparathyroidism of renal origin: Secondary | ICD-10-CM | POA: Diagnosis not present

## 2015-09-07 DIAGNOSIS — E1029 Type 1 diabetes mellitus with other diabetic kidney complication: Secondary | ICD-10-CM | POA: Diagnosis not present

## 2015-09-07 DIAGNOSIS — N186 End stage renal disease: Secondary | ICD-10-CM | POA: Diagnosis not present

## 2015-09-07 DIAGNOSIS — E119 Type 2 diabetes mellitus without complications: Secondary | ICD-10-CM | POA: Diagnosis not present

## 2015-09-09 DIAGNOSIS — D509 Iron deficiency anemia, unspecified: Secondary | ICD-10-CM | POA: Diagnosis not present

## 2015-09-09 DIAGNOSIS — E1029 Type 1 diabetes mellitus with other diabetic kidney complication: Secondary | ICD-10-CM | POA: Diagnosis not present

## 2015-09-09 DIAGNOSIS — N186 End stage renal disease: Secondary | ICD-10-CM | POA: Diagnosis not present

## 2015-09-09 DIAGNOSIS — E119 Type 2 diabetes mellitus without complications: Secondary | ICD-10-CM | POA: Diagnosis not present

## 2015-09-09 DIAGNOSIS — N2581 Secondary hyperparathyroidism of renal origin: Secondary | ICD-10-CM | POA: Diagnosis not present

## 2015-09-12 DIAGNOSIS — D509 Iron deficiency anemia, unspecified: Secondary | ICD-10-CM | POA: Diagnosis not present

## 2015-09-12 DIAGNOSIS — E119 Type 2 diabetes mellitus without complications: Secondary | ICD-10-CM | POA: Diagnosis not present

## 2015-09-12 DIAGNOSIS — E1029 Type 1 diabetes mellitus with other diabetic kidney complication: Secondary | ICD-10-CM | POA: Diagnosis not present

## 2015-09-12 DIAGNOSIS — N186 End stage renal disease: Secondary | ICD-10-CM | POA: Diagnosis not present

## 2015-09-12 DIAGNOSIS — N2581 Secondary hyperparathyroidism of renal origin: Secondary | ICD-10-CM | POA: Diagnosis not present

## 2015-09-14 DIAGNOSIS — N2581 Secondary hyperparathyroidism of renal origin: Secondary | ICD-10-CM | POA: Diagnosis not present

## 2015-09-14 DIAGNOSIS — N186 End stage renal disease: Secondary | ICD-10-CM | POA: Diagnosis not present

## 2015-09-14 DIAGNOSIS — E1029 Type 1 diabetes mellitus with other diabetic kidney complication: Secondary | ICD-10-CM | POA: Diagnosis not present

## 2015-09-14 DIAGNOSIS — E119 Type 2 diabetes mellitus without complications: Secondary | ICD-10-CM | POA: Diagnosis not present

## 2015-09-14 DIAGNOSIS — D509 Iron deficiency anemia, unspecified: Secondary | ICD-10-CM | POA: Diagnosis not present

## 2015-09-16 DIAGNOSIS — D509 Iron deficiency anemia, unspecified: Secondary | ICD-10-CM | POA: Diagnosis not present

## 2015-09-16 DIAGNOSIS — N186 End stage renal disease: Secondary | ICD-10-CM | POA: Diagnosis not present

## 2015-09-16 DIAGNOSIS — N2581 Secondary hyperparathyroidism of renal origin: Secondary | ICD-10-CM | POA: Diagnosis not present

## 2015-09-16 DIAGNOSIS — E1029 Type 1 diabetes mellitus with other diabetic kidney complication: Secondary | ICD-10-CM | POA: Diagnosis not present

## 2015-09-16 DIAGNOSIS — E119 Type 2 diabetes mellitus without complications: Secondary | ICD-10-CM | POA: Diagnosis not present

## 2015-09-19 DIAGNOSIS — E1029 Type 1 diabetes mellitus with other diabetic kidney complication: Secondary | ICD-10-CM | POA: Diagnosis not present

## 2015-09-19 DIAGNOSIS — N2581 Secondary hyperparathyroidism of renal origin: Secondary | ICD-10-CM | POA: Diagnosis not present

## 2015-09-19 DIAGNOSIS — D509 Iron deficiency anemia, unspecified: Secondary | ICD-10-CM | POA: Diagnosis not present

## 2015-09-19 DIAGNOSIS — N186 End stage renal disease: Secondary | ICD-10-CM | POA: Diagnosis not present

## 2015-09-19 DIAGNOSIS — E119 Type 2 diabetes mellitus without complications: Secondary | ICD-10-CM | POA: Diagnosis not present

## 2015-09-21 DIAGNOSIS — E119 Type 2 diabetes mellitus without complications: Secondary | ICD-10-CM | POA: Diagnosis not present

## 2015-09-21 DIAGNOSIS — N186 End stage renal disease: Secondary | ICD-10-CM | POA: Diagnosis not present

## 2015-09-21 DIAGNOSIS — E1029 Type 1 diabetes mellitus with other diabetic kidney complication: Secondary | ICD-10-CM | POA: Diagnosis not present

## 2015-09-21 DIAGNOSIS — D509 Iron deficiency anemia, unspecified: Secondary | ICD-10-CM | POA: Diagnosis not present

## 2015-09-21 DIAGNOSIS — N2581 Secondary hyperparathyroidism of renal origin: Secondary | ICD-10-CM | POA: Diagnosis not present

## 2015-09-23 DIAGNOSIS — E119 Type 2 diabetes mellitus without complications: Secondary | ICD-10-CM | POA: Diagnosis not present

## 2015-09-23 DIAGNOSIS — N2581 Secondary hyperparathyroidism of renal origin: Secondary | ICD-10-CM | POA: Diagnosis not present

## 2015-09-23 DIAGNOSIS — D509 Iron deficiency anemia, unspecified: Secondary | ICD-10-CM | POA: Diagnosis not present

## 2015-09-23 DIAGNOSIS — E1029 Type 1 diabetes mellitus with other diabetic kidney complication: Secondary | ICD-10-CM | POA: Diagnosis not present

## 2015-09-23 DIAGNOSIS — N186 End stage renal disease: Secondary | ICD-10-CM | POA: Diagnosis not present

## 2015-09-26 DIAGNOSIS — D509 Iron deficiency anemia, unspecified: Secondary | ICD-10-CM | POA: Diagnosis not present

## 2015-09-26 DIAGNOSIS — N186 End stage renal disease: Secondary | ICD-10-CM | POA: Diagnosis not present

## 2015-09-26 DIAGNOSIS — N2581 Secondary hyperparathyroidism of renal origin: Secondary | ICD-10-CM | POA: Diagnosis not present

## 2015-09-26 DIAGNOSIS — E119 Type 2 diabetes mellitus without complications: Secondary | ICD-10-CM | POA: Diagnosis not present

## 2015-09-26 DIAGNOSIS — E1029 Type 1 diabetes mellitus with other diabetic kidney complication: Secondary | ICD-10-CM | POA: Diagnosis not present

## 2015-09-27 DIAGNOSIS — I120 Hypertensive chronic kidney disease with stage 5 chronic kidney disease or end stage renal disease: Secondary | ICD-10-CM | POA: Diagnosis not present

## 2015-09-27 DIAGNOSIS — Z992 Dependence on renal dialysis: Secondary | ICD-10-CM | POA: Diagnosis not present

## 2015-09-27 DIAGNOSIS — N186 End stage renal disease: Secondary | ICD-10-CM | POA: Diagnosis not present

## 2015-09-28 DIAGNOSIS — N2581 Secondary hyperparathyroidism of renal origin: Secondary | ICD-10-CM | POA: Diagnosis not present

## 2015-09-28 DIAGNOSIS — D631 Anemia in chronic kidney disease: Secondary | ICD-10-CM | POA: Diagnosis not present

## 2015-09-28 DIAGNOSIS — D509 Iron deficiency anemia, unspecified: Secondary | ICD-10-CM | POA: Diagnosis not present

## 2015-09-28 DIAGNOSIS — N186 End stage renal disease: Secondary | ICD-10-CM | POA: Diagnosis not present

## 2015-09-28 DIAGNOSIS — R509 Fever, unspecified: Secondary | ICD-10-CM | POA: Diagnosis not present

## 2015-09-30 DIAGNOSIS — D509 Iron deficiency anemia, unspecified: Secondary | ICD-10-CM | POA: Diagnosis not present

## 2015-09-30 DIAGNOSIS — N186 End stage renal disease: Secondary | ICD-10-CM | POA: Diagnosis not present

## 2015-09-30 DIAGNOSIS — N2581 Secondary hyperparathyroidism of renal origin: Secondary | ICD-10-CM | POA: Diagnosis not present

## 2015-09-30 DIAGNOSIS — R509 Fever, unspecified: Secondary | ICD-10-CM | POA: Diagnosis not present

## 2015-09-30 DIAGNOSIS — D631 Anemia in chronic kidney disease: Secondary | ICD-10-CM | POA: Diagnosis not present

## 2015-10-03 DIAGNOSIS — N2581 Secondary hyperparathyroidism of renal origin: Secondary | ICD-10-CM | POA: Diagnosis not present

## 2015-10-03 DIAGNOSIS — D631 Anemia in chronic kidney disease: Secondary | ICD-10-CM | POA: Diagnosis not present

## 2015-10-03 DIAGNOSIS — R509 Fever, unspecified: Secondary | ICD-10-CM | POA: Diagnosis not present

## 2015-10-03 DIAGNOSIS — N186 End stage renal disease: Secondary | ICD-10-CM | POA: Diagnosis not present

## 2015-10-03 DIAGNOSIS — D509 Iron deficiency anemia, unspecified: Secondary | ICD-10-CM | POA: Diagnosis not present

## 2015-10-05 DIAGNOSIS — R509 Fever, unspecified: Secondary | ICD-10-CM | POA: Diagnosis not present

## 2015-10-05 DIAGNOSIS — N2581 Secondary hyperparathyroidism of renal origin: Secondary | ICD-10-CM | POA: Diagnosis not present

## 2015-10-05 DIAGNOSIS — D631 Anemia in chronic kidney disease: Secondary | ICD-10-CM | POA: Diagnosis not present

## 2015-10-05 DIAGNOSIS — N186 End stage renal disease: Secondary | ICD-10-CM | POA: Diagnosis not present

## 2015-10-05 DIAGNOSIS — D509 Iron deficiency anemia, unspecified: Secondary | ICD-10-CM | POA: Diagnosis not present

## 2015-10-06 DIAGNOSIS — R911 Solitary pulmonary nodule: Secondary | ICD-10-CM | POA: Diagnosis not present

## 2015-10-06 DIAGNOSIS — N289 Disorder of kidney and ureter, unspecified: Secondary | ICD-10-CM | POA: Diagnosis not present

## 2015-10-07 ENCOUNTER — Encounter: Payer: Self-pay | Admitting: Vascular Surgery

## 2015-10-07 DIAGNOSIS — R509 Fever, unspecified: Secondary | ICD-10-CM | POA: Diagnosis not present

## 2015-10-07 DIAGNOSIS — D509 Iron deficiency anemia, unspecified: Secondary | ICD-10-CM | POA: Diagnosis not present

## 2015-10-07 DIAGNOSIS — N186 End stage renal disease: Secondary | ICD-10-CM | POA: Diagnosis not present

## 2015-10-07 DIAGNOSIS — D631 Anemia in chronic kidney disease: Secondary | ICD-10-CM | POA: Diagnosis not present

## 2015-10-07 DIAGNOSIS — N2581 Secondary hyperparathyroidism of renal origin: Secondary | ICD-10-CM | POA: Diagnosis not present

## 2015-10-10 DIAGNOSIS — N186 End stage renal disease: Secondary | ICD-10-CM | POA: Diagnosis not present

## 2015-10-10 DIAGNOSIS — D631 Anemia in chronic kidney disease: Secondary | ICD-10-CM | POA: Diagnosis not present

## 2015-10-10 DIAGNOSIS — R509 Fever, unspecified: Secondary | ICD-10-CM | POA: Diagnosis not present

## 2015-10-10 DIAGNOSIS — N2581 Secondary hyperparathyroidism of renal origin: Secondary | ICD-10-CM | POA: Diagnosis not present

## 2015-10-10 DIAGNOSIS — D509 Iron deficiency anemia, unspecified: Secondary | ICD-10-CM | POA: Diagnosis not present

## 2015-10-12 ENCOUNTER — Other Ambulatory Visit: Payer: Self-pay

## 2015-10-12 ENCOUNTER — Ambulatory Visit (INDEPENDENT_AMBULATORY_CARE_PROVIDER_SITE_OTHER): Payer: Medicare Other | Admitting: Vascular Surgery

## 2015-10-12 ENCOUNTER — Ambulatory Visit (HOSPITAL_COMMUNITY)
Admission: RE | Admit: 2015-10-12 | Discharge: 2015-10-12 | Disposition: A | Discharge status: Home / Self Care | Payer: Medicare Other | Source: Ambulatory Visit | Attending: Vascular Surgery | Admitting: Vascular Surgery

## 2015-10-12 ENCOUNTER — Encounter: Payer: Self-pay | Admitting: Vascular Surgery

## 2015-10-12 VITALS — BP 168/95 | HR 76 | Temp 98.8°F | Resp 16 | Ht 72.0 in | Wt 225.0 lb

## 2015-10-12 DIAGNOSIS — Z01810 Encounter for preprocedural cardiovascular examination: Secondary | ICD-10-CM | POA: Diagnosis not present

## 2015-10-12 DIAGNOSIS — Z992 Dependence on renal dialysis: Secondary | ICD-10-CM | POA: Diagnosis not present

## 2015-10-12 DIAGNOSIS — D631 Anemia in chronic kidney disease: Secondary | ICD-10-CM | POA: Diagnosis not present

## 2015-10-12 DIAGNOSIS — R509 Fever, unspecified: Secondary | ICD-10-CM | POA: Diagnosis not present

## 2015-10-12 DIAGNOSIS — D509 Iron deficiency anemia, unspecified: Secondary | ICD-10-CM | POA: Diagnosis not present

## 2015-10-12 DIAGNOSIS — E785 Hyperlipidemia, unspecified: Secondary | ICD-10-CM | POA: Diagnosis not present

## 2015-10-12 DIAGNOSIS — N185 Chronic kidney disease, stage 5: Secondary | ICD-10-CM

## 2015-10-12 DIAGNOSIS — I120 Hypertensive chronic kidney disease with stage 5 chronic kidney disease or end stage renal disease: Secondary | ICD-10-CM | POA: Diagnosis not present

## 2015-10-12 DIAGNOSIS — N186 End stage renal disease: Secondary | ICD-10-CM | POA: Diagnosis not present

## 2015-10-12 DIAGNOSIS — N2581 Secondary hyperparathyroidism of renal origin: Secondary | ICD-10-CM | POA: Diagnosis not present

## 2015-10-12 DIAGNOSIS — M79602 Pain in left arm: Secondary | ICD-10-CM | POA: Diagnosis present

## 2015-10-12 NOTE — Progress Notes (Signed)
Filed Vitals:   10/12/15 1411 10/12/15 1413  BP: 176/94 168/95  Pulse: 75 76  Temp: 98.8 F (37.1 C)   Resp: 16   Height: 6' (1.829 m)   Weight: 225 lb (102.059 kg)   SpO2: 100%

## 2015-10-12 NOTE — Progress Notes (Signed)
Vascular and Vein Specialist of [facility]  Patient name: Jon [name] MRN: [id number] DOB: 1956/01/04 Sex: male  REASON FOR VISIT: aneurysmal left upper arm fistula.  HPI: Jon [name] is a 60 y.o. male who I saw in consultation on 08/10/2015 with a pulsatile mass in the left axilla. The patient had been referred by Dr. Joelyn Oms. The patient has a large aneurysmal left brachiocephalic AV fistula. On 03/08/2015, I performed excision of 2 large aneurysms of the fistula. Subsequently on [id number], I plicated an additional aneurysm of the left brachiocephalic fistula. I felt that it would be best to continue to use the fistula as long as possible although ultimately the patient would require new access given this large aneurysmal fistula. However, I set the patient up for a fistulogram to look for a central venous stenosis to see if this was something that could be addressed with venoplasty.  Dr. Adele Barthel performed a fistulogram on 08/18/2015.  He recommended a duplex of the left arm to evaluate patency of the left axillary and subclavian veins for use in a "cephalic vein turndown."  I have independently interpreted duplex today which shows that the left subclavian vein, axillary vein, and proximal brachial veins are patent.  I have reviewed the fistulogram. There is a pseudoaneurysm in the central portion of the fistula. The cephalic vein is irregular before entering the subclavian vein.  Of note, he tells me that his fistula has been working fine. He dialyzes on Monday Wednesdays and Fridays and just had dialysis today.  Past Medical History  Diagnosis Date  . Dialysis patient ([place])   . Chronic kidney disease   . GERD (gastroesophageal reflux disease)   . Myocardial infarction [facility])     lived in [place] maybe 7 years ago  . History of blood transfusion   . Hypertension     takes Amlodipine,Losartan,and Labetalol daily  . Hyperlipidemia     takes Atorvastatin daily    Family History    Problem Relation Age of Onset  . Hypertension Mother   . Other Mother     varicose veins  . Diabetes Father   . Hypertension Father   . Hypertension Sister   . Other Sister     varicose veins  . Other Brother     varicose veins    SOCIAL HISTORY: Social History  Substance Use Topics  . Smoking status: Never Smoker   . Smokeless tobacco: Never Used  . Alcohol Use: No    No Known Allergies  Current Outpatient Prescriptions  Medication Sig Dispense Refill  . amLODipine (NORVASC) 10 MG tablet Take 10 mg by mouth at bedtime.    Marland Kitchen aspirin EC 81 MG EC tablet Take 1 tablet (81 mg total) by mouth daily. 30 tablet 3  . atorvastatin (LIPITOR) 20 MG tablet Take 1 tablet (20 mg total) by mouth daily at 6 PM. 30 tablet 3  . carvedilol (COREG) 25 MG tablet Take 25 mg by mouth 2 (two) times daily.    Marland Kitchen labetalol (NORMODYNE) 200 MG tablet Take 1 tablet (200 mg total) by mouth 2 (two) times daily. 60 tablet 0  . losartan (COZAAR) 100 MG tablet Take 1 tablet (100 mg total) by mouth at bedtime. 30 tablet 0  . multivitamin (RENA-VIT) TABS tablet Take 1 tablet by mouth at bedtime.    . nitroGLYCERIN (NITROSTAT) 0.4 MG SL tablet Place 1 tablet (0.4 mg total) under the tongue every 5 (five) minutes x 3 doses as needed for chest pain.  25 tablet 12  . spironolactone (ALDACTONE) 25 MG tablet Take 25 mg by mouth daily.     No current facility-administered medications for this visit.    REVIEW OF SYSTEMS:  [X]  denotes positive finding, [ ]  denotes negative finding Cardiac  Comments:  Chest pain or chest pressure:    Shortness of breath upon exertion:    Short of breath when lying flat:    Irregular heart rhythm:        Vascular    Pain in calf, thigh, or hip brought on by ambulation:    Pain in feet at night that wakes you up from your sleep:     Blood clot in your veins:    Leg swelling:         Pulmonary    Oxygen at home:    Productive cough:     Wheezing:         Neurologic     Sudden weakness in arms or legs:     Sudden numbness in arms or legs:     Sudden onset of difficulty speaking or slurred speech:    Temporary loss of vision in one eye:     Problems with dizziness:         Gastrointestinal    Blood in stool:     Vomited blood:         Genitourinary    Burning when urinating:     Blood in urine:        Psychiatric    Major depression:         Hematologic    Bleeding problems:    Problems with blood clotting too easily:        Skin    Rashes or ulcers:        Constitutional    Fever or chills:      PHYSICAL EXAM: Filed Vitals:   10/12/15 1411 10/12/15 1413  BP: 176/94 168/95  Pulse: 75 76  Temp: 98.8 F (37.1 C)   Resp: 16   Height: 6' (1.829 m)   Weight: 225 lb (102.059 kg)   SpO2: 100%     GENERAL: The patient is a well-nourished male, in no acute distress. The vital signs are documented above. CARDIAC: There is a regular rate and rhythm.  VASCULAR: his fistula in the left upper arm is pulsatile. He has a palpable left radial pulse. PULMONARY: There is good air exchange bilaterally without wheezing or rales. ABDOMEN: Soft and non-tender with normal pitched bowel sounds.  MUSCULOSKELETAL: There are no major deformities or cyanosis. NEUROLOGIC: No focal weakness or paresthesias are detected. SKIN: There are no ulcers or rashes noted. PSYCHIATRIC: The patient has a normal affect.  DATA:  I looked at his forearm and upper arm cephalic vein in the right arm with the SonoSite today and these appear to be reasonable for a fistula in the right arm.  MEDICAL ISSUES:  LARGE ANEURYSMAL LEFT BRACHIOCEPHALIC AV FISTULA: this patient has had a left brachiocephalic fistula for about 10 years now. The entire fistula is aneurysmal. He has undergone 2 previous operations for excision of aneurysms and plication of the fistula. He has a pseudoaneurysm in the central portion of the fistula. He has significant irregularity in the distal cephalic  vein before it enters the subclavian vein. All things considered, I have recommended placement of a new fistula in the right arm. The dialysis unit can continue to use his current fistula in the left arm into the new  fistula is ready to be used and then I would recommend ligating that fistula given the large aneurysmal fistula. We have discussed the procedure and potential Occasions and he is agreeable to proceed. Her surgery is scheduled for 10/25/2015.  Deitra Mayo [facility] of [place]: [phone]

## 2015-10-14 DIAGNOSIS — R509 Fever, unspecified: Secondary | ICD-10-CM | POA: Diagnosis not present

## 2015-10-14 DIAGNOSIS — N186 End stage renal disease: Secondary | ICD-10-CM | POA: Diagnosis not present

## 2015-10-14 DIAGNOSIS — N2581 Secondary hyperparathyroidism of renal origin: Secondary | ICD-10-CM | POA: Diagnosis not present

## 2015-10-14 DIAGNOSIS — D509 Iron deficiency anemia, unspecified: Secondary | ICD-10-CM | POA: Diagnosis not present

## 2015-10-14 DIAGNOSIS — D631 Anemia in chronic kidney disease: Secondary | ICD-10-CM | POA: Diagnosis not present

## 2015-10-17 DIAGNOSIS — D631 Anemia in chronic kidney disease: Secondary | ICD-10-CM | POA: Diagnosis not present

## 2015-10-17 DIAGNOSIS — R509 Fever, unspecified: Secondary | ICD-10-CM | POA: Diagnosis not present

## 2015-10-17 DIAGNOSIS — N186 End stage renal disease: Secondary | ICD-10-CM | POA: Diagnosis not present

## 2015-10-17 DIAGNOSIS — N2581 Secondary hyperparathyroidism of renal origin: Secondary | ICD-10-CM | POA: Diagnosis not present

## 2015-10-17 DIAGNOSIS — D509 Iron deficiency anemia, unspecified: Secondary | ICD-10-CM | POA: Diagnosis not present

## 2015-10-19 DIAGNOSIS — D631 Anemia in chronic kidney disease: Secondary | ICD-10-CM | POA: Diagnosis not present

## 2015-10-19 DIAGNOSIS — N2581 Secondary hyperparathyroidism of renal origin: Secondary | ICD-10-CM | POA: Diagnosis not present

## 2015-10-19 DIAGNOSIS — N186 End stage renal disease: Secondary | ICD-10-CM | POA: Diagnosis not present

## 2015-10-19 DIAGNOSIS — R509 Fever, unspecified: Secondary | ICD-10-CM | POA: Diagnosis not present

## 2015-10-19 DIAGNOSIS — D509 Iron deficiency anemia, unspecified: Secondary | ICD-10-CM | POA: Diagnosis not present

## 2015-10-20 ENCOUNTER — Encounter (HOSPITAL_COMMUNITY): Payer: Self-pay | Admitting: *Deleted

## 2015-10-21 DIAGNOSIS — D509 Iron deficiency anemia, unspecified: Secondary | ICD-10-CM | POA: Diagnosis not present

## 2015-10-21 DIAGNOSIS — N186 End stage renal disease: Secondary | ICD-10-CM | POA: Diagnosis not present

## 2015-10-21 DIAGNOSIS — R509 Fever, unspecified: Secondary | ICD-10-CM | POA: Diagnosis not present

## 2015-10-21 DIAGNOSIS — N2581 Secondary hyperparathyroidism of renal origin: Secondary | ICD-10-CM | POA: Diagnosis not present

## 2015-10-21 DIAGNOSIS — D631 Anemia in chronic kidney disease: Secondary | ICD-10-CM | POA: Diagnosis not present

## 2015-10-24 DIAGNOSIS — D509 Iron deficiency anemia, unspecified: Secondary | ICD-10-CM | POA: Diagnosis not present

## 2015-10-24 DIAGNOSIS — D631 Anemia in chronic kidney disease: Secondary | ICD-10-CM | POA: Diagnosis not present

## 2015-10-24 DIAGNOSIS — N2581 Secondary hyperparathyroidism of renal origin: Secondary | ICD-10-CM | POA: Diagnosis not present

## 2015-10-24 DIAGNOSIS — R509 Fever, unspecified: Secondary | ICD-10-CM | POA: Diagnosis not present

## 2015-10-24 DIAGNOSIS — N186 End stage renal disease: Secondary | ICD-10-CM | POA: Diagnosis not present

## 2015-10-24 MED ORDER — CEFUROXIME SODIUM 1.5 G IJ SOLR
1.5000 g | INTRAMUSCULAR | Status: DC
  Filled 2015-10-24: qty 1.5

## 2015-10-24 MED ORDER — SODIUM CHLORIDE 0.9 % IV SOLN
INTRAVENOUS | Status: DC
  Administered 2015-10-25 (×2): via INTRAVENOUS

## 2015-10-25 ENCOUNTER — Encounter (HOSPITAL_COMMUNITY)
Admission: RE | Disposition: A | Discharge status: Home / Self Care | Payer: Self-pay | Source: Ambulatory Visit | Attending: Vascular Surgery

## 2015-10-25 ENCOUNTER — Encounter (HOSPITAL_COMMUNITY): Payer: Self-pay | Admitting: General Practice

## 2015-10-25 ENCOUNTER — Ambulatory Visit (HOSPITAL_COMMUNITY)
Admission: RE | Admit: 2015-10-25 | Discharge: 2015-10-25 | Disposition: A | Discharge status: Home / Self Care | Payer: Medicare Other | Source: Ambulatory Visit | Attending: Vascular Surgery | Admitting: Vascular Surgery

## 2015-10-25 ENCOUNTER — Other Ambulatory Visit: Payer: Self-pay | Admitting: *Deleted

## 2015-10-25 ENCOUNTER — Ambulatory Visit (HOSPITAL_COMMUNITY): Payer: Medicare Other | Admitting: Anesthesiology

## 2015-10-25 DIAGNOSIS — Z8249 Family history of ischemic heart disease and other diseases of the circulatory system: Secondary | ICD-10-CM | POA: Diagnosis not present

## 2015-10-25 DIAGNOSIS — I252 Old myocardial infarction: Secondary | ICD-10-CM | POA: Diagnosis not present

## 2015-10-25 DIAGNOSIS — Y832 Surgical operation with anastomosis, bypass or graft as the cause of abnormal reaction of the patient, or of later complication, without mention of misadventure at the time of the procedure: Secondary | ICD-10-CM | POA: Diagnosis not present

## 2015-10-25 DIAGNOSIS — Z992 Dependence on renal dialysis: Secondary | ICD-10-CM | POA: Diagnosis not present

## 2015-10-25 DIAGNOSIS — N186 End stage renal disease: Secondary | ICD-10-CM

## 2015-10-25 DIAGNOSIS — N185 Chronic kidney disease, stage 5: Secondary | ICD-10-CM | POA: Diagnosis not present

## 2015-10-25 DIAGNOSIS — Z7982 Long term (current) use of aspirin: Secondary | ICD-10-CM | POA: Diagnosis not present

## 2015-10-25 DIAGNOSIS — E785 Hyperlipidemia, unspecified: Secondary | ICD-10-CM | POA: Insufficient documentation

## 2015-10-25 DIAGNOSIS — I120 Hypertensive chronic kidney disease with stage 5 chronic kidney disease or end stage renal disease: Secondary | ICD-10-CM | POA: Insufficient documentation

## 2015-10-25 DIAGNOSIS — T82898A Other specified complication of vascular prosthetic devices, implants and grafts, initial encounter: Secondary | ICD-10-CM | POA: Insufficient documentation

## 2015-10-25 DIAGNOSIS — Z4931 Encounter for adequacy testing for hemodialysis: Secondary | ICD-10-CM

## 2015-10-25 DIAGNOSIS — I729 Aneurysm of unspecified site: Secondary | ICD-10-CM | POA: Diagnosis not present

## 2015-10-25 DIAGNOSIS — K219 Gastro-esophageal reflux disease without esophagitis: Secondary | ICD-10-CM | POA: Diagnosis not present

## 2015-10-25 HISTORY — PX: AV FISTULA PLACEMENT: SHX1204

## 2015-10-25 LAB — POCT I-STAT 4, (NA,K, GLUC, HGB,HCT)
GLUCOSE: 80 mg/dL (ref 65–99)
HEMATOCRIT: 33 % — AB (ref 39.0–52.0)
HEMOGLOBIN: 11.2 g/dL — AB (ref 13.0–17.0)
POTASSIUM: 4.5 mmol/L (ref 3.5–5.1)
Sodium: 140 mmol/L (ref 135–145)

## 2015-10-25 SURGERY — ARTERIOVENOUS (AV) FISTULA CREATION
Anesthesia: Monitor Anesthesia Care | Site: Arm Lower | Laterality: Right

## 2015-10-25 MED ORDER — 0.9 % SODIUM CHLORIDE (POUR BTL) OPTIME
TOPICAL | Status: DC | PRN
  Administered 2015-10-25: 1000 mL

## 2015-10-25 MED ORDER — EPHEDRINE SULFATE 50 MG/ML IJ SOLN
INTRAMUSCULAR | Status: AC
  Filled 2015-10-25: qty 1

## 2015-10-25 MED ORDER — HEPARIN SODIUM (PORCINE) 1000 UNIT/ML IJ SOLN
INTRAMUSCULAR | Status: DC | PRN
  Administered 2015-10-25: 8000 [IU] via INTRAVENOUS

## 2015-10-25 MED ORDER — LIDOCAINE HCL (PF) 1 % IJ SOLN
INTRAMUSCULAR | Status: DC | PRN
  Administered 2015-10-25: 30 mL

## 2015-10-25 MED ORDER — ALBUMIN HUMAN 5 % IV SOLN
INTRAVENOUS | Status: DC | PRN
  Administered 2015-10-25: 10:00:00 via INTRAVENOUS

## 2015-10-25 MED ORDER — FENTANYL CITRATE (PF) 100 MCG/2ML IJ SOLN
INTRAMUSCULAR | Status: DC | PRN
  Administered 2015-10-25: 50 ug via INTRAVENOUS

## 2015-10-25 MED ORDER — EPHEDRINE SULFATE 50 MG/ML IJ SOLN
INTRAMUSCULAR | Status: DC | PRN
  Administered 2015-10-25: 10 mg via INTRAVENOUS

## 2015-10-25 MED ORDER — PROPOFOL 500 MG/50ML IV EMUL
INTRAVENOUS | Status: DC | PRN
  Administered 2015-10-25: 40 ug/kg/min via INTRAVENOUS

## 2015-10-25 MED ORDER — PROPOFOL 10 MG/ML IV BOLUS
INTRAVENOUS | Status: AC
  Filled 2015-10-25: qty 20

## 2015-10-25 MED ORDER — OXYCODONE HCL 5 MG PO TABS: 5.0000 mg | ORAL_TABLET | Freq: Four times a day (QID) | ORAL | Status: DC | PRN

## 2015-10-25 MED ORDER — PROTAMINE SULFATE 10 MG/ML IV SOLN
INTRAVENOUS | Status: DC | PRN
  Administered 2015-10-25 (×5): 10 mg via INTRAVENOUS

## 2015-10-25 MED ORDER — LIDOCAINE HCL (CARDIAC) 20 MG/ML IV SOLN
INTRAVENOUS | Status: AC
  Filled 2015-10-25: qty 5

## 2015-10-25 MED ORDER — PHENYLEPHRINE HCL 10 MG/ML IJ SOLN
INTRAMUSCULAR | Status: DC | PRN
  Administered 2015-10-25 (×5): 80 ug via INTRAVENOUS

## 2015-10-25 MED ORDER — MIDAZOLAM HCL 5 MG/5ML IJ SOLN
INTRAMUSCULAR | Status: DC | PRN
  Administered 2015-10-25 (×2): 1 mg via INTRAVENOUS

## 2015-10-25 MED ORDER — HYDROMORPHONE HCL 1 MG/ML IJ SOLN: 0.2500 mg | INTRAMUSCULAR | Status: DC | PRN

## 2015-10-25 MED ORDER — LIDOCAINE HCL (PF) 1 % IJ SOLN
INTRAMUSCULAR | Status: AC
  Filled 2015-10-25: qty 30

## 2015-10-25 MED ORDER — FENTANYL CITRATE (PF) 250 MCG/5ML IJ SOLN
INTRAMUSCULAR | Status: AC
  Filled 2015-10-25: qty 5

## 2015-10-25 MED ORDER — PHENYLEPHRINE 40 MCG/ML (10ML) SYRINGE FOR IV PUSH (FOR BLOOD PRESSURE SUPPORT)
PREFILLED_SYRINGE | INTRAVENOUS | Status: AC
  Filled 2015-10-25: qty 10

## 2015-10-25 MED ORDER — ONDANSETRON HCL 4 MG/2ML IJ SOLN
INTRAMUSCULAR | Status: DC | PRN
  Administered 2015-10-25: 4 mg via INTRAVENOUS

## 2015-10-25 MED ORDER — HEPARIN SODIUM (PORCINE) 1000 UNIT/ML IJ SOLN
INTRAMUSCULAR | Status: AC
  Filled 2015-10-25: qty 1

## 2015-10-25 MED ORDER — LIDOCAINE-EPINEPHRINE (PF) 1 %-1:200000 IJ SOLN
INTRAMUSCULAR | Status: AC
  Filled 2015-10-25: qty 30

## 2015-10-25 MED ORDER — CHLORHEXIDINE GLUCONATE CLOTH 2 % EX PADS: 6.0000 | MEDICATED_PAD | Freq: Once | CUTANEOUS | Status: DC

## 2015-10-25 MED ORDER — DEXTROSE 5 % IV SOLN
10.0000 mg | INTRAVENOUS | Status: DC | PRN
  Administered 2015-10-25: 80 ug/min via INTRAVENOUS

## 2015-10-25 MED ORDER — MIDAZOLAM HCL 2 MG/2ML IJ SOLN
INTRAMUSCULAR | Status: AC
  Filled 2015-10-25: qty 2

## 2015-10-25 MED ORDER — SODIUM CHLORIDE 0.9 % IV SOLN
INTRAVENOUS | Status: DC | PRN
  Administered 2015-10-25: 10:00:00

## 2015-10-25 SURGICAL SUPPLY — 33 items
ARMBAND PINK RESTRICT EXTREMIT (MISCELLANEOUS) ×3 IMPLANT
CANISTER SUCTION 2500CC (MISCELLANEOUS) ×3 IMPLANT
CANNULA VESSEL 3MM 2 BLNT TIP (CANNULA) ×3 IMPLANT
CLIP TI MEDIUM 6 (CLIP) ×3 IMPLANT
CLIP TI WIDE RED SMALL 6 (CLIP) ×6 IMPLANT
DECANTER SPIKE VIAL GLASS SM (MISCELLANEOUS) ×3 IMPLANT
ELECT REM PT RETURN 9FT ADLT (ELECTROSURGICAL) ×3
ELECTRODE REM PT RTRN 9FT ADLT (ELECTROSURGICAL) ×1 IMPLANT
GLOVE BIO SURGEON STRL SZ 6.5 (GLOVE) ×4 IMPLANT
GLOVE BIO SURGEON STRL SZ7.5 (GLOVE) ×3 IMPLANT
GLOVE BIO SURGEONS STRL SZ 6.5 (GLOVE) ×2
GLOVE BIOGEL PI IND STRL 6.5 (GLOVE) ×1 IMPLANT
GLOVE BIOGEL PI IND STRL 7.0 (GLOVE) ×2 IMPLANT
GLOVE BIOGEL PI IND STRL 8 (GLOVE) ×1 IMPLANT
GLOVE BIOGEL PI INDICATOR 6.5 (GLOVE) ×2
GLOVE BIOGEL PI INDICATOR 7.0 (GLOVE) ×4
GLOVE BIOGEL PI INDICATOR 8 (GLOVE) ×2
GLOVE SURG SS PI 6.5 STRL IVOR (GLOVE) ×3 IMPLANT
GLOVE SURG SS PI 7.0 STRL IVOR (GLOVE) ×3 IMPLANT
GOWN STRL REUS W/ TWL LRG LVL3 (GOWN DISPOSABLE) ×3 IMPLANT
GOWN STRL REUS W/TWL LRG LVL3 (GOWN DISPOSABLE) ×6
KIT BASIN OR (CUSTOM PROCEDURE TRAY) ×3 IMPLANT
KIT ROOM TURNOVER OR (KITS) ×3 IMPLANT
LIQUID BAND (GAUZE/BANDAGES/DRESSINGS) ×3 IMPLANT
NS IRRIG 1000ML POUR BTL (IV SOLUTION) ×3 IMPLANT
PACK CV ACCESS (CUSTOM PROCEDURE TRAY) ×3 IMPLANT
PAD ARMBOARD 7.5X6 YLW CONV (MISCELLANEOUS) ×6 IMPLANT
SUT PROLENE 6 0 BV (SUTURE) ×6 IMPLANT
SUT VIC AB 3-0 SH 27 (SUTURE) ×2
SUT VIC AB 3-0 SH 27X BRD (SUTURE) ×1 IMPLANT
SUT VICRYL 4-0 PS2 18IN ABS (SUTURE) ×3 IMPLANT
UNDERPAD 30X30 INCONTINENT (UNDERPADS AND DIAPERS) ×3 IMPLANT
WATER STERILE IRR 1000ML POUR (IV SOLUTION) ×3 IMPLANT

## 2015-10-25 NOTE — Transfer of Care (Signed)
Immediate Anesthesia Transfer of Care Note  Patient: Jon [name]  Procedure(s) Performed: Procedure(s): ARTERIOVENOUS (AV) FISTULA CREATION RIGHT FOREARM (Right)  Patient Location: PACU  Anesthesia Type:MAC  Level of Consciousness: awake and patient cooperative  Airway & Oxygen Therapy: Patient Spontanous Breathing and Patient connected to face mask oxygen  Post-op Assessment: Report given to RN, Post -op Vital signs reviewed and stable and Patient moving all extremities  Post vital signs: Reviewed and stable  Last Vitals:  Filed Vitals:   10/25/15 0655  BP: 97/54  Pulse: 78  Temp: 36.8 C  Resp: 16    Complications: No apparent anesthesia complications

## 2015-10-25 NOTE — Interval H&P Note (Signed)
History and Physical Interval Note:  10/25/2015 8:51 AM  Jon [name]  has presented today for surgery, with the diagnosis of End Stage Renal Disease N18.6; Aneurysmal left arm arteriovenous fistula I72.9  The various methods of treatment have been discussed with the patient and family. After consideration of risks, benefits and other options for treatment, the patient has consented to  Procedure(s): ARTERIOVENOUS (AV) FISTULA CREATION (Right) as a surgical intervention .  The patient's history has been reviewed, patient examined, no change in status, stable for surgery.  I have reviewed the patient's chart and labs.  Questions were answered to the patient's satisfaction.     Deitra Mayo

## 2015-10-25 NOTE — Discharge Instructions (Signed)
° ° °  10/25/2015 Name Seese [id number] [birth year]  Surgeon(s): Angelia Mould, MD  Procedure(s): ARTERIOVENOUS (AV) FISTULA CREATION RIGHT FOREARM  x Do not stick fistula for 12 weeks

## 2015-10-25 NOTE — Anesthesia Preprocedure Evaluation (Addendum)
Anesthesia Evaluation  Patient identified by MRN, date of birth, ID band Patient awake    Reviewed: Allergy & Precautions, NPO status , Patient's Chart, lab work & pertinent test results  Airway Mallampati: II  TM Distance: >3 FB Neck ROM: Full    Dental  (+) Partial Upper, Dental Advisory Given, Teeth Intact   Pulmonary neg pulmonary ROS,    breath sounds clear to auscultation       Cardiovascular hypertension, Pt. on home beta blockers and Pt. on medications + Past MI   Rhythm:Regular Rate:Normal     Neuro/Psych    GI/Hepatic Neg liver ROS, GERD  Controlled and Medicated,  Endo/Other    Renal/GU Renal disease     Musculoskeletal   Abdominal   Peds  Hematology   Anesthesia Other Findings   Reproductive/Obstetrics                           Anesthesia Physical Anesthesia Plan  ASA: IV  Anesthesia Plan: MAC   Post-op Pain Management:    Induction: Intravenous  Airway Management Planned: Simple Face Mask  Additional Equipment:   Intra-op Plan:   Post-operative Plan:   Informed Consent: I have reviewed the patients History and Physical, chart, labs and discussed the procedure including the risks, benefits and alternatives for the proposed anesthesia with the patient or authorized representative who has indicated his/her understanding and acceptance.   Dental advisory given  Plan Discussed with: CRNA and Anesthesiologist  Anesthesia Plan Comments:         Anesthesia Quick Evaluation

## 2015-10-25 NOTE — Op Note (Signed)
    NAMECarmon [name]    MRN: [id number] DOB: 23-Jul-1956    DATE OF OPERATION: 10/25/2015  PREOP DIAGNOSIS: Stage V chronic kidney disease  POSTOP DIAGNOSIS: Same  PROCEDURE: Right radial cephalic AV fistula  SURGEON: Judeth Cornfield. Scot Dock, MD, FACS  ASSIST: Leontine Locket, PA  ANESTHESIA: Local with sedation   EBL: minimal  INDICATIONS: Jon [name] is a 60 y.o. male who has had a large aneurysmal left upper arm fistula which has been revised multiple times. At this point I do not think it can be salvaged. He presents for new access in the right arm with the thought that they will continue to use the left arm fistula until the new fistula is ready to use in approximately 3 months.  FINDINGS: 3.5 mm right forearm cephalic vein  TECHNIQUE: The patient was taken to the operating room and sedated by anesthesia. The right upper extremity was prepped and draped in usual sterile fashion. After the skin was anesthetized, an oblique incision was made at the wrist. Here the cephalic vein was dissected free. It was ligated distally. It irrigated up nicely with heparinized saline. It was a 3.5 mm vein. The radial artery was dissected free beneath the fascia. The patient was heparinized. The radial artery was clamped proximally and distally and a longitudinal arteriotomy was made. The vein was spatulated and sewn end-to-side to the artery using continuous 6-0 Prolene suture. At the completion was an excellent thrill in the fistula. One large competing branch was clipped. The heparin was partially reversed with protamine. The wound was closed deep with 3-0 Vicryl and the skin closed with 4-0 Vicryl. Liquid bandage was applied. The patient tolerated the procedure well and was transferred to the recovery room in stable condition. All needle and sponge counts were correct.  Deitra Mayo, MD, FACS [facility]ascular and Vein Specialists of [facility]  DATE OF DICTATION:   10/25/2015

## 2015-10-25 NOTE — H&P (View-Only) (Signed)
Vascular and Vein Specialist of [facility]  Patient name: Jon [name] MRN: [id number] DOB: [birth year] Sex: male  REASON FOR VISIT: aneurysmal left upper arm fistula.  HPI: Jon [name] is a 60 y.o. male who I saw in consultation on 08/10/2015 with a pulsatile mass in the left axilla. The patient had been referred by Dr. Joelyn Oms. The patient has a large aneurysmal left brachiocephalic AV fistula. On 03/08/2015, I performed excision of 2 large aneurysms of the fistula. Subsequently on A999333, I plicated an additional aneurysm of the left brachiocephalic fistula. I felt that it would be best to continue to use the fistula as long as possible although ultimately the patient would require new access given this large aneurysmal fistula. However, I set the patient up for a fistulogram to look for a central venous stenosis to see if this was something that could be addressed with venoplasty.  Dr. Adele Barthel performed a fistulogram on 08/18/2015.  He recommended a duplex of the left arm to evaluate patency of the left axillary and subclavian veins for use in a "cephalic vein turndown."  I have independently interpreted duplex today which shows that the left subclavian vein, axillary vein, and proximal brachial veins are patent.  I have reviewed the fistulogram. There is a pseudoaneurysm in the central portion of the fistula. The cephalic vein is irregular before entering the subclavian vein.  Of note, he tells me that his fistula has been working fine. He dialyzes on Monday Wednesdays and Fridays and just had dialysis today.  Past Medical History  Diagnosis Date  . Dialysis patient ([place])   . Chronic kidney disease   . GERD (gastroesophageal reflux disease)   . Myocardial infarction [facility])     lived in [place] maybe 7 years ago  . History of blood transfusion   . Hypertension     takes Amlodipine,Losartan,and Labetalol daily  . Hyperlipidemia     takes Atorvastatin daily    Family History    Problem Relation Age of Onset  . Hypertension Mother   . Other Mother     varicose veins  . Diabetes Father   . Hypertension Father   . Hypertension Sister   . Other Sister     varicose veins  . Other Brother     varicose veins    SOCIAL HISTORY: Social History  Substance Use Topics  . Smoking status: Never Smoker   . Smokeless tobacco: Never Used  . Alcohol Use: No    No Known Allergies  Current Outpatient Prescriptions  Medication Sig Dispense Refill  . amLODipine (NORVASC) 10 MG tablet Take 10 mg by mouth at bedtime.    Marland Kitchen aspirin EC 81 MG EC tablet Take 1 tablet (81 mg total) by mouth daily. 30 tablet 3  . atorvastatin (LIPITOR) 20 MG tablet Take 1 tablet (20 mg total) by mouth daily at 6 PM. 30 tablet 3  . carvedilol (COREG) 25 MG tablet Take 25 mg by mouth 2 (two) times daily.    Marland Kitchen labetalol (NORMODYNE) 200 MG tablet Take 1 tablet (200 mg total) by mouth 2 (two) times daily. 60 tablet 0  . losartan (COZAAR) 100 MG tablet Take 1 tablet (100 mg total) by mouth at bedtime. 30 tablet 0  . multivitamin (RENA-VIT) TABS tablet Take 1 tablet by mouth at bedtime.    . nitroGLYCERIN (NITROSTAT) 0.4 MG SL tablet Place 1 tablet (0.4 mg total) under the tongue every 5 (five) minutes x 3 doses as needed for chest pain.  25 tablet 12  . spironolactone (ALDACTONE) 25 MG tablet Take 25 mg by mouth daily.     No current facility-administered medications for this visit.    REVIEW OF SYSTEMS:  [X]  denotes positive finding, [ ]  denotes negative finding Cardiac  Comments:  Chest pain or chest pressure:    Shortness of breath upon exertion:    Short of breath when lying flat:    Irregular heart rhythm:        Vascular    Pain in calf, thigh, or hip brought on by ambulation:    Pain in feet at night that wakes you up from your sleep:     Blood clot in your veins:    Leg swelling:         Pulmonary    Oxygen at home:    Productive cough:     Wheezing:         Neurologic     Sudden weakness in arms or legs:     Sudden numbness in arms or legs:     Sudden onset of difficulty speaking or slurred speech:    Temporary loss of vision in one eye:     Problems with dizziness:         Gastrointestinal    Blood in stool:     Vomited blood:         Genitourinary    Burning when urinating:     Blood in urine:        Psychiatric    Major depression:         Hematologic    Bleeding problems:    Problems with blood clotting too easily:        Skin    Rashes or ulcers:        Constitutional    Fever or chills:      PHYSICAL EXAM: Filed Vitals:   10/12/15 1411 10/12/15 1413  BP: 176/94 168/95  Pulse: 75 76  Temp: 98.8 F (37.1 C)   Resp: 16   Height: 6' (1.829 m)   Weight: 225 lb (102.059 kg)   SpO2: 100%     GENERAL: The patient is a well-nourished male, in no acute distress. The vital signs are documented above. CARDIAC: There is a regular rate and rhythm.  VASCULAR: his fistula in the left upper arm is pulsatile. He has a palpable left radial pulse. PULMONARY: There is good air exchange bilaterally without wheezing or rales. ABDOMEN: Soft and non-tender with normal pitched bowel sounds.  MUSCULOSKELETAL: There are no major deformities or cyanosis. NEUROLOGIC: No focal weakness or paresthesias are detected. SKIN: There are no ulcers or rashes noted. PSYCHIATRIC: The patient has a normal affect.  DATA:  I looked at his forearm and upper arm cephalic vein in the right arm with the SonoSite today and these appear to be reasonable for a fistula in the right arm.  MEDICAL ISSUES:  LARGE ANEURYSMAL LEFT BRACHIOCEPHALIC AV FISTULA: this patient has had a left brachiocephalic fistula for about 10 years now. The entire fistula is aneurysmal. He has undergone 2 previous operations for excision of aneurysms and plication of the fistula. He has a pseudoaneurysm in the central portion of the fistula. He has significant irregularity in the distal cephalic  vein before it enters the subclavian vein. All things considered, I have recommended placement of a new fistula in the right arm. The dialysis unit can continue to use his current fistula in the left arm into the new  fistula is ready to be used and then I would recommend ligating that fistula given the large aneurysmal fistula. We have discussed the procedure and potential Occasions and he is agreeable to proceed. Her surgery is scheduled for 10/25/2015.  Deitra Mayo [facility] of [place]: [phone]

## 2015-10-25 NOTE — Anesthesia Postprocedure Evaluation (Signed)
Anesthesia Post Note  Patient: [name]  Procedure(s) Performed: Procedure(s) (LRB): ARTERIOVENOUS (AV) FISTULA CREATION RIGHT FOREARM (Right)  Patient location during evaluation: PACU Anesthesia Type: General Level of consciousness: awake Pain management: pain level controlled Vital Signs Assessment: post-procedure vital signs reviewed and stable Respiratory status: spontaneous breathing Cardiovascular status: stable Anesthetic complications: no    Last Vitals:  Filed Vitals:   10/25/15 1153 10/25/15 1206  BP: 101/63 95/54  Pulse: 75 74  Temp:    Resp: 14     Last Pain:  Filed Vitals:   10/25/15 1207  PainSc: Asleep                 EDWARDS,Maisyn Nouri

## 2015-10-26 ENCOUNTER — Telehealth: Payer: Self-pay | Admitting: Vascular Surgery

## 2015-10-26 ENCOUNTER — Encounter (HOSPITAL_COMMUNITY): Payer: Self-pay | Admitting: Vascular Surgery

## 2015-10-26 DIAGNOSIS — R509 Fever, unspecified: Secondary | ICD-10-CM | POA: Diagnosis not present

## 2015-10-26 DIAGNOSIS — N2581 Secondary hyperparathyroidism of renal origin: Secondary | ICD-10-CM | POA: Diagnosis not present

## 2015-10-26 DIAGNOSIS — D631 Anemia in chronic kidney disease: Secondary | ICD-10-CM | POA: Diagnosis not present

## 2015-10-26 DIAGNOSIS — N186 End stage renal disease: Secondary | ICD-10-CM | POA: Diagnosis not present

## 2015-10-26 DIAGNOSIS — D509 Iron deficiency anemia, unspecified: Secondary | ICD-10-CM | POA: Diagnosis not present

## 2015-10-26 NOTE — Telephone Encounter (Addendum)
Spoke to pt to sch appt. Lab on 11/29/15 at 4. CSD on 12/07/15 at 2:15.  ----- Message from Mena Goes, RN sent at 10/25/2015 12:01 PM EDT ----- Regarding: schedule   ----- Message -----    From: Gabriel Earing, PA-C    Sent: 10/25/2015  10:54 AM      To: Vvs Charge Pool  S/p right radial cephalic AVF 0000000.  F/u with CSD in 6 weeks with duplex.  Thanks, Aldona Bar

## 2015-10-28 DIAGNOSIS — D509 Iron deficiency anemia, unspecified: Secondary | ICD-10-CM | POA: Diagnosis not present

## 2015-10-28 DIAGNOSIS — R509 Fever, unspecified: Secondary | ICD-10-CM | POA: Diagnosis not present

## 2015-10-28 DIAGNOSIS — I120 Hypertensive chronic kidney disease with stage 5 chronic kidney disease or end stage renal disease: Secondary | ICD-10-CM | POA: Diagnosis not present

## 2015-10-28 DIAGNOSIS — Z992 Dependence on renal dialysis: Secondary | ICD-10-CM | POA: Diagnosis not present

## 2015-10-28 DIAGNOSIS — N2581 Secondary hyperparathyroidism of renal origin: Secondary | ICD-10-CM | POA: Diagnosis not present

## 2015-10-28 DIAGNOSIS — D631 Anemia in chronic kidney disease: Secondary | ICD-10-CM | POA: Diagnosis not present

## 2015-10-28 DIAGNOSIS — N186 End stage renal disease: Secondary | ICD-10-CM | POA: Diagnosis not present

## 2015-10-31 DIAGNOSIS — D631 Anemia in chronic kidney disease: Secondary | ICD-10-CM | POA: Diagnosis not present

## 2015-10-31 DIAGNOSIS — E119 Type 2 diabetes mellitus without complications: Secondary | ICD-10-CM | POA: Diagnosis not present

## 2015-10-31 DIAGNOSIS — N186 End stage renal disease: Secondary | ICD-10-CM | POA: Diagnosis not present

## 2015-10-31 DIAGNOSIS — D509 Iron deficiency anemia, unspecified: Secondary | ICD-10-CM | POA: Diagnosis not present

## 2015-10-31 DIAGNOSIS — N2581 Secondary hyperparathyroidism of renal origin: Secondary | ICD-10-CM | POA: Diagnosis not present

## 2015-11-02 DIAGNOSIS — N2581 Secondary hyperparathyroidism of renal origin: Secondary | ICD-10-CM | POA: Diagnosis not present

## 2015-11-02 DIAGNOSIS — N186 End stage renal disease: Secondary | ICD-10-CM | POA: Diagnosis not present

## 2015-11-02 DIAGNOSIS — D631 Anemia in chronic kidney disease: Secondary | ICD-10-CM | POA: Diagnosis not present

## 2015-11-02 DIAGNOSIS — D509 Iron deficiency anemia, unspecified: Secondary | ICD-10-CM | POA: Diagnosis not present

## 2015-11-02 DIAGNOSIS — E119 Type 2 diabetes mellitus without complications: Secondary | ICD-10-CM | POA: Diagnosis not present

## 2015-11-04 DIAGNOSIS — N2581 Secondary hyperparathyroidism of renal origin: Secondary | ICD-10-CM | POA: Diagnosis not present

## 2015-11-04 DIAGNOSIS — D509 Iron deficiency anemia, unspecified: Secondary | ICD-10-CM | POA: Diagnosis not present

## 2015-11-04 DIAGNOSIS — E119 Type 2 diabetes mellitus without complications: Secondary | ICD-10-CM | POA: Diagnosis not present

## 2015-11-04 DIAGNOSIS — N186 End stage renal disease: Secondary | ICD-10-CM | POA: Diagnosis not present

## 2015-11-04 DIAGNOSIS — D631 Anemia in chronic kidney disease: Secondary | ICD-10-CM | POA: Diagnosis not present

## 2015-11-07 DIAGNOSIS — N186 End stage renal disease: Secondary | ICD-10-CM | POA: Diagnosis not present

## 2015-11-07 DIAGNOSIS — D631 Anemia in chronic kidney disease: Secondary | ICD-10-CM | POA: Diagnosis not present

## 2015-11-07 DIAGNOSIS — E119 Type 2 diabetes mellitus without complications: Secondary | ICD-10-CM | POA: Diagnosis not present

## 2015-11-07 DIAGNOSIS — D509 Iron deficiency anemia, unspecified: Secondary | ICD-10-CM | POA: Diagnosis not present

## 2015-11-07 DIAGNOSIS — N2581 Secondary hyperparathyroidism of renal origin: Secondary | ICD-10-CM | POA: Diagnosis not present

## 2015-11-09 DIAGNOSIS — E119 Type 2 diabetes mellitus without complications: Secondary | ICD-10-CM | POA: Diagnosis not present

## 2015-11-09 DIAGNOSIS — D509 Iron deficiency anemia, unspecified: Secondary | ICD-10-CM | POA: Diagnosis not present

## 2015-11-09 DIAGNOSIS — N186 End stage renal disease: Secondary | ICD-10-CM | POA: Diagnosis not present

## 2015-11-09 DIAGNOSIS — D631 Anemia in chronic kidney disease: Secondary | ICD-10-CM | POA: Diagnosis not present

## 2015-11-09 DIAGNOSIS — N2581 Secondary hyperparathyroidism of renal origin: Secondary | ICD-10-CM | POA: Diagnosis not present

## 2015-11-11 DIAGNOSIS — N2581 Secondary hyperparathyroidism of renal origin: Secondary | ICD-10-CM | POA: Diagnosis not present

## 2015-11-11 DIAGNOSIS — D509 Iron deficiency anemia, unspecified: Secondary | ICD-10-CM | POA: Diagnosis not present

## 2015-11-11 DIAGNOSIS — N186 End stage renal disease: Secondary | ICD-10-CM | POA: Diagnosis not present

## 2015-11-11 DIAGNOSIS — E119 Type 2 diabetes mellitus without complications: Secondary | ICD-10-CM | POA: Diagnosis not present

## 2015-11-11 DIAGNOSIS — D631 Anemia in chronic kidney disease: Secondary | ICD-10-CM | POA: Diagnosis not present

## 2015-11-14 DIAGNOSIS — D631 Anemia in chronic kidney disease: Secondary | ICD-10-CM | POA: Diagnosis not present

## 2015-11-14 DIAGNOSIS — N2581 Secondary hyperparathyroidism of renal origin: Secondary | ICD-10-CM | POA: Diagnosis not present

## 2015-11-14 DIAGNOSIS — N186 End stage renal disease: Secondary | ICD-10-CM | POA: Diagnosis not present

## 2015-11-14 DIAGNOSIS — D509 Iron deficiency anemia, unspecified: Secondary | ICD-10-CM | POA: Diagnosis not present

## 2015-11-14 DIAGNOSIS — E119 Type 2 diabetes mellitus without complications: Secondary | ICD-10-CM | POA: Diagnosis not present

## 2015-11-16 DIAGNOSIS — N186 End stage renal disease: Secondary | ICD-10-CM | POA: Diagnosis not present

## 2015-11-16 DIAGNOSIS — N2581 Secondary hyperparathyroidism of renal origin: Secondary | ICD-10-CM | POA: Diagnosis not present

## 2015-11-16 DIAGNOSIS — D509 Iron deficiency anemia, unspecified: Secondary | ICD-10-CM | POA: Diagnosis not present

## 2015-11-16 DIAGNOSIS — E119 Type 2 diabetes mellitus without complications: Secondary | ICD-10-CM | POA: Diagnosis not present

## 2015-11-16 DIAGNOSIS — D631 Anemia in chronic kidney disease: Secondary | ICD-10-CM | POA: Diagnosis not present

## 2015-11-17 DIAGNOSIS — I2510 Atherosclerotic heart disease of native coronary artery without angina pectoris: Secondary | ICD-10-CM | POA: Diagnosis not present

## 2015-11-17 DIAGNOSIS — E785 Hyperlipidemia, unspecified: Secondary | ICD-10-CM | POA: Diagnosis not present

## 2015-11-17 DIAGNOSIS — N186 End stage renal disease: Secondary | ICD-10-CM | POA: Diagnosis not present

## 2015-11-17 DIAGNOSIS — I6529 Occlusion and stenosis of unspecified carotid artery: Secondary | ICD-10-CM | POA: Diagnosis not present

## 2015-11-17 DIAGNOSIS — E669 Obesity, unspecified: Secondary | ICD-10-CM | POA: Diagnosis not present

## 2015-11-17 DIAGNOSIS — D649 Anemia, unspecified: Secondary | ICD-10-CM | POA: Diagnosis not present

## 2015-11-17 DIAGNOSIS — R7309 Other abnormal glucose: Secondary | ICD-10-CM | POA: Diagnosis not present

## 2015-11-17 DIAGNOSIS — I120 Hypertensive chronic kidney disease with stage 5 chronic kidney disease or end stage renal disease: Secondary | ICD-10-CM | POA: Diagnosis not present

## 2015-11-18 DIAGNOSIS — D509 Iron deficiency anemia, unspecified: Secondary | ICD-10-CM | POA: Diagnosis not present

## 2015-11-18 DIAGNOSIS — N186 End stage renal disease: Secondary | ICD-10-CM | POA: Diagnosis not present

## 2015-11-18 DIAGNOSIS — D631 Anemia in chronic kidney disease: Secondary | ICD-10-CM | POA: Diagnosis not present

## 2015-11-18 DIAGNOSIS — E119 Type 2 diabetes mellitus without complications: Secondary | ICD-10-CM | POA: Diagnosis not present

## 2015-11-18 DIAGNOSIS — N2581 Secondary hyperparathyroidism of renal origin: Secondary | ICD-10-CM | POA: Diagnosis not present

## 2015-11-21 DIAGNOSIS — D631 Anemia in chronic kidney disease: Secondary | ICD-10-CM | POA: Diagnosis not present

## 2015-11-21 DIAGNOSIS — D509 Iron deficiency anemia, unspecified: Secondary | ICD-10-CM | POA: Diagnosis not present

## 2015-11-21 DIAGNOSIS — N2581 Secondary hyperparathyroidism of renal origin: Secondary | ICD-10-CM | POA: Diagnosis not present

## 2015-11-21 DIAGNOSIS — N186 End stage renal disease: Secondary | ICD-10-CM | POA: Diagnosis not present

## 2015-11-21 DIAGNOSIS — E119 Type 2 diabetes mellitus without complications: Secondary | ICD-10-CM | POA: Diagnosis not present

## 2015-11-23 DIAGNOSIS — N2581 Secondary hyperparathyroidism of renal origin: Secondary | ICD-10-CM | POA: Diagnosis not present

## 2015-11-23 DIAGNOSIS — D509 Iron deficiency anemia, unspecified: Secondary | ICD-10-CM | POA: Diagnosis not present

## 2015-11-23 DIAGNOSIS — E119 Type 2 diabetes mellitus without complications: Secondary | ICD-10-CM | POA: Diagnosis not present

## 2015-11-23 DIAGNOSIS — N186 End stage renal disease: Secondary | ICD-10-CM | POA: Diagnosis not present

## 2015-11-23 DIAGNOSIS — D631 Anemia in chronic kidney disease: Secondary | ICD-10-CM | POA: Diagnosis not present

## 2015-11-25 DIAGNOSIS — D509 Iron deficiency anemia, unspecified: Secondary | ICD-10-CM | POA: Diagnosis not present

## 2015-11-25 DIAGNOSIS — N186 End stage renal disease: Secondary | ICD-10-CM | POA: Diagnosis not present

## 2015-11-25 DIAGNOSIS — D631 Anemia in chronic kidney disease: Secondary | ICD-10-CM | POA: Diagnosis not present

## 2015-11-25 DIAGNOSIS — E119 Type 2 diabetes mellitus without complications: Secondary | ICD-10-CM | POA: Diagnosis not present

## 2015-11-25 DIAGNOSIS — N2581 Secondary hyperparathyroidism of renal origin: Secondary | ICD-10-CM | POA: Diagnosis not present

## 2015-11-27 DIAGNOSIS — I120 Hypertensive chronic kidney disease with stage 5 chronic kidney disease or end stage renal disease: Secondary | ICD-10-CM | POA: Diagnosis not present

## 2015-11-27 DIAGNOSIS — N186 End stage renal disease: Secondary | ICD-10-CM | POA: Diagnosis not present

## 2015-11-27 DIAGNOSIS — Z992 Dependence on renal dialysis: Secondary | ICD-10-CM | POA: Diagnosis not present

## 2015-11-28 DIAGNOSIS — E119 Type 2 diabetes mellitus without complications: Secondary | ICD-10-CM | POA: Diagnosis not present

## 2015-11-28 DIAGNOSIS — N186 End stage renal disease: Secondary | ICD-10-CM | POA: Diagnosis not present

## 2015-11-28 DIAGNOSIS — N2581 Secondary hyperparathyroidism of renal origin: Secondary | ICD-10-CM | POA: Diagnosis not present

## 2015-11-28 DIAGNOSIS — D509 Iron deficiency anemia, unspecified: Secondary | ICD-10-CM | POA: Diagnosis not present

## 2015-11-29 ENCOUNTER — Ambulatory Visit (HOSPITAL_COMMUNITY)
Admission: RE | Admit: 2015-11-29 | Discharge: 2015-11-29 | Disposition: A | Discharge status: Home / Self Care | Payer: Medicare Other | Source: Ambulatory Visit | Attending: Vascular Surgery | Admitting: Vascular Surgery

## 2015-11-29 DIAGNOSIS — K219 Gastro-esophageal reflux disease without esophagitis: Secondary | ICD-10-CM | POA: Insufficient documentation

## 2015-11-29 DIAGNOSIS — I120 Hypertensive chronic kidney disease with stage 5 chronic kidney disease or end stage renal disease: Secondary | ICD-10-CM | POA: Insufficient documentation

## 2015-11-29 DIAGNOSIS — E785 Hyperlipidemia, unspecified: Secondary | ICD-10-CM | POA: Diagnosis not present

## 2015-11-29 DIAGNOSIS — N186 End stage renal disease: Secondary | ICD-10-CM | POA: Diagnosis not present

## 2015-11-29 DIAGNOSIS — Z4931 Encounter for adequacy testing for hemodialysis: Secondary | ICD-10-CM | POA: Diagnosis not present

## 2015-11-30 DIAGNOSIS — D509 Iron deficiency anemia, unspecified: Secondary | ICD-10-CM | POA: Diagnosis not present

## 2015-11-30 DIAGNOSIS — N186 End stage renal disease: Secondary | ICD-10-CM | POA: Diagnosis not present

## 2015-11-30 DIAGNOSIS — N2581 Secondary hyperparathyroidism of renal origin: Secondary | ICD-10-CM | POA: Diagnosis not present

## 2015-11-30 DIAGNOSIS — E119 Type 2 diabetes mellitus without complications: Secondary | ICD-10-CM | POA: Diagnosis not present

## 2015-12-02 ENCOUNTER — Encounter: Payer: Self-pay | Admitting: Vascular Surgery

## 2015-12-02 DIAGNOSIS — D509 Iron deficiency anemia, unspecified: Secondary | ICD-10-CM | POA: Diagnosis not present

## 2015-12-02 DIAGNOSIS — N2581 Secondary hyperparathyroidism of renal origin: Secondary | ICD-10-CM | POA: Diagnosis not present

## 2015-12-02 DIAGNOSIS — N186 End stage renal disease: Secondary | ICD-10-CM | POA: Diagnosis not present

## 2015-12-02 DIAGNOSIS — E119 Type 2 diabetes mellitus without complications: Secondary | ICD-10-CM | POA: Diagnosis not present

## 2015-12-05 DIAGNOSIS — N186 End stage renal disease: Secondary | ICD-10-CM | POA: Diagnosis not present

## 2015-12-05 DIAGNOSIS — D509 Iron deficiency anemia, unspecified: Secondary | ICD-10-CM | POA: Diagnosis not present

## 2015-12-05 DIAGNOSIS — E119 Type 2 diabetes mellitus without complications: Secondary | ICD-10-CM | POA: Diagnosis not present

## 2015-12-05 DIAGNOSIS — N2581 Secondary hyperparathyroidism of renal origin: Secondary | ICD-10-CM | POA: Diagnosis not present

## 2015-12-07 ENCOUNTER — Encounter: Payer: Self-pay | Admitting: Vascular Surgery

## 2015-12-07 ENCOUNTER — Ambulatory Visit (INDEPENDENT_AMBULATORY_CARE_PROVIDER_SITE_OTHER): Payer: Medicare Other | Admitting: Vascular Surgery

## 2015-12-07 VITALS — BP 164/84 | HR 75 | Ht 72.0 in | Wt 223.0 lb

## 2015-12-07 DIAGNOSIS — E119 Type 2 diabetes mellitus without complications: Secondary | ICD-10-CM | POA: Diagnosis not present

## 2015-12-07 DIAGNOSIS — N186 End stage renal disease: Secondary | ICD-10-CM | POA: Diagnosis not present

## 2015-12-07 DIAGNOSIS — N2581 Secondary hyperparathyroidism of renal origin: Secondary | ICD-10-CM | POA: Diagnosis not present

## 2015-12-07 DIAGNOSIS — D509 Iron deficiency anemia, unspecified: Secondary | ICD-10-CM | POA: Diagnosis not present

## 2015-12-07 DIAGNOSIS — Z992 Dependence on renal dialysis: Secondary | ICD-10-CM

## 2015-12-07 NOTE — Progress Notes (Signed)
  POST OPERATIVE OFFICE NOTE    CC:  F/u for surgery  HPI:  This is a 60 y.o. male who is s/p right radial cephalic AVF 0000000 by Dr. Scot Dock.  He states he is doing well and denies any pain in his hand.  He is currently dialyzing via a left arm fistula that has large and aneurysmal.    No Known Allergies  Current Outpatient Prescriptions  Medication Sig Dispense Refill  . amLODipine (NORVASC) 10 MG tablet Take 10 mg by mouth at bedtime.    Marland Kitchen aspirin EC 81 MG EC tablet Take 1 tablet (81 mg total) by mouth daily. 30 tablet 3  . atorvastatin (LIPITOR) 20 MG tablet Take 1 tablet (20 mg total) by mouth daily at 6 PM. 30 tablet 3  . carvedilol (COREG) 25 MG tablet Take 25 mg by mouth 2 (two) times daily.    Marland Kitchen labetalol (NORMODYNE) 200 MG tablet Take 1 tablet (200 mg total) by mouth 2 (two) times daily. 60 tablet 0  . losartan (COZAAR) 100 MG tablet Take 1 tablet (100 mg total) by mouth at bedtime. 30 tablet 0  . multivitamin (RENA-VIT) TABS tablet Take 1 tablet by mouth at bedtime.    . nitroGLYCERIN (NITROSTAT) 0.4 MG SL tablet Place 1 tablet (0.4 mg total) under the tongue every 5 (five) minutes x 3 doses as needed for chest pain. 25 tablet 12  . oxyCODONE (ROXICODONE) 5 MG immediate release tablet Take 1 tablet (5 mg total) by mouth every 6 (six) hours as needed. 20 tablet 0  . PHOSLYRA 667 MG/5ML SOLN Take 15 mLs by mouth 3 (three) times daily with meals.    Marland Kitchen spironolactone (ALDACTONE) 25 MG tablet Take 25 mg by mouth daily.     No current facility-administered medications for this visit.     ROS:  See HPI  Physical Exam:  Filed Vitals:   12/07/15 1402 12/07/15 1407  BP: 162/83 164/84  Pulse: 75     Incision:  Well healed Extremities:  Easily palpable thrill throughout the fistula up to the antecubital space; 2+ right radial pulse. Sensory and motor are in tact right hand.   Left arm fistula is aneurysmal and large with a palpable thrill.    Vascular access duplex  11/29/15: Diameter:  0.39cm-0.68cm Depth:  0.22cm-0.30cm   Assessment/Plan:  This is a 60 y.o. male who is s/p: Right radial cephalic AVF placed by Dr. Scot Dock on 10/25/15  -pt is doing well without any steal symptoms -his fistula is maturing nicely and is easily palpable -may begin using the fistula 3 months from placement, which would be January 25, 2016 -he will f/u with [place] as needed.   Leontine Locket, PA-C [facility] [phone]  Clinic MD:  Pt seen and examined with Dr. Scot Dock.

## 2015-12-09 DIAGNOSIS — N186 End stage renal disease: Secondary | ICD-10-CM | POA: Diagnosis not present

## 2015-12-09 DIAGNOSIS — D509 Iron deficiency anemia, unspecified: Secondary | ICD-10-CM | POA: Diagnosis not present

## 2015-12-09 DIAGNOSIS — N2581 Secondary hyperparathyroidism of renal origin: Secondary | ICD-10-CM | POA: Diagnosis not present

## 2015-12-09 DIAGNOSIS — E119 Type 2 diabetes mellitus without complications: Secondary | ICD-10-CM | POA: Diagnosis not present

## 2015-12-12 DIAGNOSIS — E119 Type 2 diabetes mellitus without complications: Secondary | ICD-10-CM | POA: Diagnosis not present

## 2015-12-12 DIAGNOSIS — D509 Iron deficiency anemia, unspecified: Secondary | ICD-10-CM | POA: Diagnosis not present

## 2015-12-12 DIAGNOSIS — N2581 Secondary hyperparathyroidism of renal origin: Secondary | ICD-10-CM | POA: Diagnosis not present

## 2015-12-12 DIAGNOSIS — N186 End stage renal disease: Secondary | ICD-10-CM | POA: Diagnosis not present

## 2015-12-14 DIAGNOSIS — N2581 Secondary hyperparathyroidism of renal origin: Secondary | ICD-10-CM | POA: Diagnosis not present

## 2015-12-14 DIAGNOSIS — N186 End stage renal disease: Secondary | ICD-10-CM | POA: Diagnosis not present

## 2015-12-14 DIAGNOSIS — D509 Iron deficiency anemia, unspecified: Secondary | ICD-10-CM | POA: Diagnosis not present

## 2015-12-14 DIAGNOSIS — E119 Type 2 diabetes mellitus without complications: Secondary | ICD-10-CM | POA: Diagnosis not present

## 2015-12-16 DIAGNOSIS — E119 Type 2 diabetes mellitus without complications: Secondary | ICD-10-CM | POA: Diagnosis not present

## 2015-12-16 DIAGNOSIS — N2581 Secondary hyperparathyroidism of renal origin: Secondary | ICD-10-CM | POA: Diagnosis not present

## 2015-12-16 DIAGNOSIS — N186 End stage renal disease: Secondary | ICD-10-CM | POA: Diagnosis not present

## 2015-12-16 DIAGNOSIS — D509 Iron deficiency anemia, unspecified: Secondary | ICD-10-CM | POA: Diagnosis not present

## 2015-12-19 DIAGNOSIS — N186 End stage renal disease: Secondary | ICD-10-CM | POA: Diagnosis not present

## 2015-12-19 DIAGNOSIS — D509 Iron deficiency anemia, unspecified: Secondary | ICD-10-CM | POA: Diagnosis not present

## 2015-12-19 DIAGNOSIS — E119 Type 2 diabetes mellitus without complications: Secondary | ICD-10-CM | POA: Diagnosis not present

## 2015-12-19 DIAGNOSIS — N2581 Secondary hyperparathyroidism of renal origin: Secondary | ICD-10-CM | POA: Diagnosis not present

## 2015-12-21 DIAGNOSIS — N2581 Secondary hyperparathyroidism of renal origin: Secondary | ICD-10-CM | POA: Diagnosis not present

## 2015-12-21 DIAGNOSIS — E1151 Type 2 diabetes mellitus with diabetic peripheral angiopathy without gangrene: Secondary | ICD-10-CM | POA: Diagnosis not present

## 2015-12-21 DIAGNOSIS — N186 End stage renal disease: Secondary | ICD-10-CM | POA: Diagnosis not present

## 2015-12-21 DIAGNOSIS — E119 Type 2 diabetes mellitus without complications: Secondary | ICD-10-CM | POA: Diagnosis not present

## 2015-12-21 DIAGNOSIS — D509 Iron deficiency anemia, unspecified: Secondary | ICD-10-CM | POA: Diagnosis not present

## 2015-12-23 DIAGNOSIS — D509 Iron deficiency anemia, unspecified: Secondary | ICD-10-CM | POA: Diagnosis not present

## 2015-12-23 DIAGNOSIS — N186 End stage renal disease: Secondary | ICD-10-CM | POA: Diagnosis not present

## 2015-12-23 DIAGNOSIS — N2581 Secondary hyperparathyroidism of renal origin: Secondary | ICD-10-CM | POA: Diagnosis not present

## 2015-12-23 DIAGNOSIS — E119 Type 2 diabetes mellitus without complications: Secondary | ICD-10-CM | POA: Diagnosis not present

## 2015-12-26 DIAGNOSIS — D509 Iron deficiency anemia, unspecified: Secondary | ICD-10-CM | POA: Diagnosis not present

## 2015-12-26 DIAGNOSIS — N186 End stage renal disease: Secondary | ICD-10-CM | POA: Diagnosis not present

## 2015-12-26 DIAGNOSIS — E119 Type 2 diabetes mellitus without complications: Secondary | ICD-10-CM | POA: Diagnosis not present

## 2015-12-26 DIAGNOSIS — N2581 Secondary hyperparathyroidism of renal origin: Secondary | ICD-10-CM | POA: Diagnosis not present

## 2015-12-28 DIAGNOSIS — Z992 Dependence on renal dialysis: Secondary | ICD-10-CM | POA: Diagnosis not present

## 2015-12-28 DIAGNOSIS — E119 Type 2 diabetes mellitus without complications: Secondary | ICD-10-CM | POA: Diagnosis not present

## 2015-12-28 DIAGNOSIS — I120 Hypertensive chronic kidney disease with stage 5 chronic kidney disease or end stage renal disease: Secondary | ICD-10-CM | POA: Diagnosis not present

## 2015-12-28 DIAGNOSIS — D509 Iron deficiency anemia, unspecified: Secondary | ICD-10-CM | POA: Diagnosis not present

## 2015-12-28 DIAGNOSIS — N2581 Secondary hyperparathyroidism of renal origin: Secondary | ICD-10-CM | POA: Diagnosis not present

## 2015-12-28 DIAGNOSIS — N186 End stage renal disease: Secondary | ICD-10-CM | POA: Diagnosis not present

## 2015-12-30 DIAGNOSIS — E119 Type 2 diabetes mellitus without complications: Secondary | ICD-10-CM | POA: Diagnosis not present

## 2015-12-30 DIAGNOSIS — D509 Iron deficiency anemia, unspecified: Secondary | ICD-10-CM | POA: Diagnosis not present

## 2015-12-30 DIAGNOSIS — N186 End stage renal disease: Secondary | ICD-10-CM | POA: Diagnosis not present

## 2015-12-30 DIAGNOSIS — N2581 Secondary hyperparathyroidism of renal origin: Secondary | ICD-10-CM | POA: Diagnosis not present

## 2016-01-02 DIAGNOSIS — D509 Iron deficiency anemia, unspecified: Secondary | ICD-10-CM | POA: Diagnosis not present

## 2016-01-02 DIAGNOSIS — E119 Type 2 diabetes mellitus without complications: Secondary | ICD-10-CM | POA: Diagnosis not present

## 2016-01-02 DIAGNOSIS — N2581 Secondary hyperparathyroidism of renal origin: Secondary | ICD-10-CM | POA: Diagnosis not present

## 2016-01-02 DIAGNOSIS — N186 End stage renal disease: Secondary | ICD-10-CM | POA: Diagnosis not present

## 2016-01-04 DIAGNOSIS — D509 Iron deficiency anemia, unspecified: Secondary | ICD-10-CM | POA: Diagnosis not present

## 2016-01-04 DIAGNOSIS — E119 Type 2 diabetes mellitus without complications: Secondary | ICD-10-CM | POA: Diagnosis not present

## 2016-01-04 DIAGNOSIS — N186 End stage renal disease: Secondary | ICD-10-CM | POA: Diagnosis not present

## 2016-01-04 DIAGNOSIS — N2581 Secondary hyperparathyroidism of renal origin: Secondary | ICD-10-CM | POA: Diagnosis not present

## 2016-01-06 DIAGNOSIS — N2581 Secondary hyperparathyroidism of renal origin: Secondary | ICD-10-CM | POA: Diagnosis not present

## 2016-01-06 DIAGNOSIS — D509 Iron deficiency anemia, unspecified: Secondary | ICD-10-CM | POA: Diagnosis not present

## 2016-01-06 DIAGNOSIS — N186 End stage renal disease: Secondary | ICD-10-CM | POA: Diagnosis not present

## 2016-01-06 DIAGNOSIS — E119 Type 2 diabetes mellitus without complications: Secondary | ICD-10-CM | POA: Diagnosis not present

## 2016-01-09 DIAGNOSIS — D509 Iron deficiency anemia, unspecified: Secondary | ICD-10-CM | POA: Diagnosis not present

## 2016-01-09 DIAGNOSIS — N186 End stage renal disease: Secondary | ICD-10-CM | POA: Diagnosis not present

## 2016-01-09 DIAGNOSIS — N2581 Secondary hyperparathyroidism of renal origin: Secondary | ICD-10-CM | POA: Diagnosis not present

## 2016-01-09 DIAGNOSIS — E119 Type 2 diabetes mellitus without complications: Secondary | ICD-10-CM | POA: Diagnosis not present

## 2016-01-11 DIAGNOSIS — N2581 Secondary hyperparathyroidism of renal origin: Secondary | ICD-10-CM | POA: Diagnosis not present

## 2016-01-11 DIAGNOSIS — D509 Iron deficiency anemia, unspecified: Secondary | ICD-10-CM | POA: Diagnosis not present

## 2016-01-11 DIAGNOSIS — N186 End stage renal disease: Secondary | ICD-10-CM | POA: Diagnosis not present

## 2016-01-11 DIAGNOSIS — E119 Type 2 diabetes mellitus without complications: Secondary | ICD-10-CM | POA: Diagnosis not present

## 2016-01-13 DIAGNOSIS — N186 End stage renal disease: Secondary | ICD-10-CM | POA: Diagnosis not present

## 2016-01-13 DIAGNOSIS — N2581 Secondary hyperparathyroidism of renal origin: Secondary | ICD-10-CM | POA: Diagnosis not present

## 2016-01-13 DIAGNOSIS — E119 Type 2 diabetes mellitus without complications: Secondary | ICD-10-CM | POA: Diagnosis not present

## 2016-01-13 DIAGNOSIS — D509 Iron deficiency anemia, unspecified: Secondary | ICD-10-CM | POA: Diagnosis not present

## 2016-01-16 DIAGNOSIS — D509 Iron deficiency anemia, unspecified: Secondary | ICD-10-CM | POA: Diagnosis not present

## 2016-01-16 DIAGNOSIS — E119 Type 2 diabetes mellitus without complications: Secondary | ICD-10-CM | POA: Diagnosis not present

## 2016-01-16 DIAGNOSIS — N2581 Secondary hyperparathyroidism of renal origin: Secondary | ICD-10-CM | POA: Diagnosis not present

## 2016-01-16 DIAGNOSIS — N186 End stage renal disease: Secondary | ICD-10-CM | POA: Diagnosis not present

## 2016-01-18 DIAGNOSIS — N2581 Secondary hyperparathyroidism of renal origin: Secondary | ICD-10-CM | POA: Diagnosis not present

## 2016-01-18 DIAGNOSIS — E119 Type 2 diabetes mellitus without complications: Secondary | ICD-10-CM | POA: Diagnosis not present

## 2016-01-18 DIAGNOSIS — N186 End stage renal disease: Secondary | ICD-10-CM | POA: Diagnosis not present

## 2016-01-18 DIAGNOSIS — D509 Iron deficiency anemia, unspecified: Secondary | ICD-10-CM | POA: Diagnosis not present

## 2016-01-20 DIAGNOSIS — N186 End stage renal disease: Secondary | ICD-10-CM | POA: Diagnosis not present

## 2016-01-20 DIAGNOSIS — D509 Iron deficiency anemia, unspecified: Secondary | ICD-10-CM | POA: Diagnosis not present

## 2016-01-20 DIAGNOSIS — N2581 Secondary hyperparathyroidism of renal origin: Secondary | ICD-10-CM | POA: Diagnosis not present

## 2016-01-20 DIAGNOSIS — E119 Type 2 diabetes mellitus without complications: Secondary | ICD-10-CM | POA: Diagnosis not present

## 2016-01-23 DIAGNOSIS — E119 Type 2 diabetes mellitus without complications: Secondary | ICD-10-CM | POA: Diagnosis not present

## 2016-01-23 DIAGNOSIS — N186 End stage renal disease: Secondary | ICD-10-CM | POA: Diagnosis not present

## 2016-01-23 DIAGNOSIS — N2581 Secondary hyperparathyroidism of renal origin: Secondary | ICD-10-CM | POA: Diagnosis not present

## 2016-01-23 DIAGNOSIS — D509 Iron deficiency anemia, unspecified: Secondary | ICD-10-CM | POA: Diagnosis not present

## 2016-01-25 DIAGNOSIS — N2581 Secondary hyperparathyroidism of renal origin: Secondary | ICD-10-CM | POA: Diagnosis not present

## 2016-01-25 DIAGNOSIS — D509 Iron deficiency anemia, unspecified: Secondary | ICD-10-CM | POA: Diagnosis not present

## 2016-01-25 DIAGNOSIS — N186 End stage renal disease: Secondary | ICD-10-CM | POA: Diagnosis not present

## 2016-01-25 DIAGNOSIS — E119 Type 2 diabetes mellitus without complications: Secondary | ICD-10-CM | POA: Diagnosis not present

## 2016-01-27 DIAGNOSIS — I120 Hypertensive chronic kidney disease with stage 5 chronic kidney disease or end stage renal disease: Secondary | ICD-10-CM | POA: Diagnosis not present

## 2016-01-27 DIAGNOSIS — N186 End stage renal disease: Secondary | ICD-10-CM | POA: Diagnosis not present

## 2016-01-27 DIAGNOSIS — Z992 Dependence on renal dialysis: Secondary | ICD-10-CM | POA: Diagnosis not present

## 2016-01-27 DIAGNOSIS — N2581 Secondary hyperparathyroidism of renal origin: Secondary | ICD-10-CM | POA: Diagnosis not present

## 2016-01-27 DIAGNOSIS — E119 Type 2 diabetes mellitus without complications: Secondary | ICD-10-CM | POA: Diagnosis not present

## 2016-01-27 DIAGNOSIS — D509 Iron deficiency anemia, unspecified: Secondary | ICD-10-CM | POA: Diagnosis not present

## 2016-01-30 DIAGNOSIS — D509 Iron deficiency anemia, unspecified: Secondary | ICD-10-CM | POA: Diagnosis not present

## 2016-01-30 DIAGNOSIS — R509 Fever, unspecified: Secondary | ICD-10-CM | POA: Diagnosis not present

## 2016-01-30 DIAGNOSIS — N2581 Secondary hyperparathyroidism of renal origin: Secondary | ICD-10-CM | POA: Diagnosis not present

## 2016-01-30 DIAGNOSIS — N186 End stage renal disease: Secondary | ICD-10-CM | POA: Diagnosis not present

## 2016-01-30 DIAGNOSIS — E119 Type 2 diabetes mellitus without complications: Secondary | ICD-10-CM | POA: Diagnosis not present

## 2016-02-01 DIAGNOSIS — D509 Iron deficiency anemia, unspecified: Secondary | ICD-10-CM | POA: Diagnosis not present

## 2016-02-01 DIAGNOSIS — N186 End stage renal disease: Secondary | ICD-10-CM | POA: Diagnosis not present

## 2016-02-01 DIAGNOSIS — R509 Fever, unspecified: Secondary | ICD-10-CM | POA: Diagnosis not present

## 2016-02-01 DIAGNOSIS — E119 Type 2 diabetes mellitus without complications: Secondary | ICD-10-CM | POA: Diagnosis not present

## 2016-02-01 DIAGNOSIS — N2581 Secondary hyperparathyroidism of renal origin: Secondary | ICD-10-CM | POA: Diagnosis not present

## 2016-02-03 DIAGNOSIS — E119 Type 2 diabetes mellitus without complications: Secondary | ICD-10-CM | POA: Diagnosis not present

## 2016-02-03 DIAGNOSIS — D509 Iron deficiency anemia, unspecified: Secondary | ICD-10-CM | POA: Diagnosis not present

## 2016-02-03 DIAGNOSIS — N186 End stage renal disease: Secondary | ICD-10-CM | POA: Diagnosis not present

## 2016-02-03 DIAGNOSIS — R509 Fever, unspecified: Secondary | ICD-10-CM | POA: Diagnosis not present

## 2016-02-03 DIAGNOSIS — N2581 Secondary hyperparathyroidism of renal origin: Secondary | ICD-10-CM | POA: Diagnosis not present

## 2016-02-06 DIAGNOSIS — E119 Type 2 diabetes mellitus without complications: Secondary | ICD-10-CM | POA: Diagnosis not present

## 2016-02-06 DIAGNOSIS — R509 Fever, unspecified: Secondary | ICD-10-CM | POA: Diagnosis not present

## 2016-02-06 DIAGNOSIS — D509 Iron deficiency anemia, unspecified: Secondary | ICD-10-CM | POA: Diagnosis not present

## 2016-02-06 DIAGNOSIS — N2581 Secondary hyperparathyroidism of renal origin: Secondary | ICD-10-CM | POA: Diagnosis not present

## 2016-02-06 DIAGNOSIS — N186 End stage renal disease: Secondary | ICD-10-CM | POA: Diagnosis not present

## 2016-02-08 DIAGNOSIS — N2581 Secondary hyperparathyroidism of renal origin: Secondary | ICD-10-CM | POA: Diagnosis not present

## 2016-02-08 DIAGNOSIS — N186 End stage renal disease: Secondary | ICD-10-CM | POA: Diagnosis not present

## 2016-02-08 DIAGNOSIS — R509 Fever, unspecified: Secondary | ICD-10-CM | POA: Diagnosis not present

## 2016-02-08 DIAGNOSIS — D509 Iron deficiency anemia, unspecified: Secondary | ICD-10-CM | POA: Diagnosis not present

## 2016-02-08 DIAGNOSIS — E119 Type 2 diabetes mellitus without complications: Secondary | ICD-10-CM | POA: Diagnosis not present

## 2016-02-10 DIAGNOSIS — N2581 Secondary hyperparathyroidism of renal origin: Secondary | ICD-10-CM | POA: Diagnosis not present

## 2016-02-10 DIAGNOSIS — D509 Iron deficiency anemia, unspecified: Secondary | ICD-10-CM | POA: Diagnosis not present

## 2016-02-10 DIAGNOSIS — N186 End stage renal disease: Secondary | ICD-10-CM | POA: Diagnosis not present

## 2016-02-10 DIAGNOSIS — R509 Fever, unspecified: Secondary | ICD-10-CM | POA: Diagnosis not present

## 2016-02-10 DIAGNOSIS — E119 Type 2 diabetes mellitus without complications: Secondary | ICD-10-CM | POA: Diagnosis not present

## 2016-02-13 ENCOUNTER — Telehealth: Payer: Self-pay

## 2016-02-13 DIAGNOSIS — E119 Type 2 diabetes mellitus without complications: Secondary | ICD-10-CM | POA: Diagnosis not present

## 2016-02-13 DIAGNOSIS — R509 Fever, unspecified: Secondary | ICD-10-CM | POA: Diagnosis not present

## 2016-02-13 DIAGNOSIS — D509 Iron deficiency anemia, unspecified: Secondary | ICD-10-CM | POA: Diagnosis not present

## 2016-02-13 DIAGNOSIS — N186 End stage renal disease: Secondary | ICD-10-CM | POA: Diagnosis not present

## 2016-02-13 DIAGNOSIS — N2581 Secondary hyperparathyroidism of renal origin: Secondary | ICD-10-CM | POA: Diagnosis not present

## 2016-02-13 NOTE — Telephone Encounter (Signed)
Phone call from [facility]; [place].  Requested that pt. Be scheduled tomorrow for an appt. to evaluate pain and warmth at left arm AVF site.  Reported no redness or drainage.  Appt. given for 11:45 AM on 7/18, with Nurse Practitioner.  Crystal, @ [facility], stated she will notify the pt.

## 2016-02-14 ENCOUNTER — Ambulatory Visit (INDEPENDENT_AMBULATORY_CARE_PROVIDER_SITE_OTHER): Payer: Medicare Other | Admitting: Family

## 2016-02-14 ENCOUNTER — Encounter: Payer: Self-pay | Admitting: Family

## 2016-02-14 VITALS — BP 108/60 | HR 88 | Temp 98.1°F | Resp 18 | Ht 72.0 in | Wt 224.0 lb

## 2016-02-14 DIAGNOSIS — Z992 Dependence on renal dialysis: Secondary | ICD-10-CM

## 2016-02-14 DIAGNOSIS — T82898A Other specified complication of vascular prosthetic devices, implants and grafts, initial encounter: Secondary | ICD-10-CM | POA: Diagnosis not present

## 2016-02-14 DIAGNOSIS — N186 End stage renal disease: Secondary | ICD-10-CM

## 2016-02-14 NOTE — Progress Notes (Signed)
Established Dialysis Access  History of Present Illness  Jon [name] is a 60 y.o. ([birth year]) male who is s/p right radial cephalic AVF creation on 0000000 by Dr. Scot Dock.   He was dialyzing via a left arm fistula that was large and aneurysmal.  He returns today after a phone call yesterday from [facility]; [place]. Requested that pt be scheduled the next day (today) for an appt. to evaluate pain and warmth at right arm AVF site. Reported no redness or drainage. He states the right forearm AVF was first accessed 02/06/16 and since then he has had pain, swelling, and fever feeling in the right forearm. He states his left upper arm aneurysmal AVF was then accessed for HD.  Pt states he had body fever and chills yesterday. He dialyzes M-W-F, states he was given an antibiotic yesterday in HD.   Past Medical History  Diagnosis Date  . Dialysis patient ([place])   . Chronic kidney disease   . GERD (gastroesophageal reflux disease)   . Myocardial infarction [facility])     lived in [place] maybe 7 years ago  . History of blood transfusion   . Hypertension     takes Amlodipine,Losartan,and Labetalol daily  . Hyperlipidemia     takes Atorvastatin daily    Social History Social History  Substance Use Topics  . Smoking status: Never Smoker   . Smokeless tobacco: Never Used  . Alcohol Use: No    Family History Family History  Problem Relation Age of Onset  . Hypertension Mother   . Other Mother     varicose veins  . Diabetes Father   . Hypertension Father   . Hypertension Sister   . Other Sister     varicose veins  . Other Brother     varicose veins    Surgical History Past Surgical History  Procedure Laterality Date  . Arteriovenous graft placement    . Small intestine surgery    . Small intestine surgery    . Resection of arteriovenous fistula aneurysm Left 02/05/2013    Procedure: REPAIR OF ANEURYSM OF LEFT ARM ARTERIOVENOUS FISTULA ;  Surgeon: Serafina Mitchell, MD;  Location: [place] OR;  Service: Vascular;  Laterality: Left;  . Resection of arteriovenous fistula aneurysm Left 03/08/2015    Procedure: REPAIR OF LEFT ARTERIOVENOUS FISTULA PSEUDOANEURYSM;  Surgeon: Angelia Mould, MD;  Location: [facility];  Service: Vascular;  Laterality: Left;  . Cardiac catheterization N/A 05/05/2015    Procedure: Left Heart Cath and Coronary Angiography;  Surgeon: Charolette Forward, MD;  Location: [place] CV LAB;  Service: Cardiovascular;  Laterality: N/A;  . [place]ison of arteriovenous fistula Left 0000000    Procedure: PLICATION OF A LARGE ANEURYSM LEFT UPPER ARM BRACHIO-CEPHALIC ARTERIOVENOUS FISTULA ;  Surgeon: Angelia Mould, MD;  Location: [place];  Service: Vascular;  Laterality: Left;  . Peripheral vascular catheterization N/A 08/18/2015    Procedure: Fistulagram;  Surgeon: Conrad [place], MD;  Location: [place] CV LAB;  Service: Cardiovascular;  Laterality: N/A;  . Av fistula placement Right 10/25/2015    Procedure: ARTERIOVENOUS (AV) FISTULA CREATION RIGHT FOREARM;  Surgeon: Angelia Mould, MD;  Location: [facility] OR;  Service: Vascular;  Laterality: Right;    No Known Allergies  Current Outpatient Prescriptions  Medication Sig Dispense Refill  . amLODipine (NORVASC) 10 MG tablet Take 10 mg by mouth at bedtime.    Marland Kitchen aspirin EC 81 MG EC tablet Take 1 tablet (81 mg total)  by mouth daily. 30 tablet 3  . atorvastatin (LIPITOR) 20 MG tablet Take 1 tablet (20 mg total) by mouth daily at 6 PM. 30 tablet 3  . carvedilol (COREG) 25 MG tablet Take 25 mg by mouth 2 (two) times daily.    Marland Kitchen labetalol (NORMODYNE) 200 MG tablet Take 1 tablet (200 mg total) by mouth 2 (two) times daily. 60 tablet 0  . losartan (COZAAR) 100 MG tablet Take 1 tablet (100 mg total) by mouth at bedtime. 30 tablet 0  . multivitamin (RENA-VIT) TABS tablet Take 1 tablet by mouth at bedtime.    . nitroGLYCERIN (NITROSTAT) 0.4 MG SL tablet Place 1 tablet (0.4 mg total) under the tongue  every 5 (five) minutes x 3 doses as needed for chest pain. 25 tablet 12  . oxyCODONE (ROXICODONE) 5 MG immediate release tablet Take 1 tablet (5 mg total) by mouth every 6 (six) hours as needed. 20 tablet 0  . PHOSLYRA 667 MG/5ML SOLN Take 15 mLs by mouth 3 (three) times daily with meals.    Marland Kitchen spironolactone (ALDACTONE) 25 MG tablet Take 25 mg by mouth daily.     No current facility-administered medications for this visit.     REVIEW OF SYSTEMS: see HPI for pertinent positives and negatives    PHYSICAL EXAMINATION:  Filed Vitals:   02/14/16 1143  BP: 108/60  Pulse: 88  Temp: 98.1 F (36.7 C)  TempSrc: Oral  Resp: 18  Height: 6' (1.829 m)  Weight: 224 lb (101.606 kg)  SpO2: 100%   Body mass index is 30.37 kg/(m^2).  General: The obese male appears their stated age.   HEENT:  No gross abnormalities Pulmonary: Respirations are non-labored Abdomen: Soft and non-tender Musculoskeletal: There are no major deformities.   Neurologic: No focal weakness or paresthesias are detected Skin: There are no ulcer or rashes noted. Psychiatric: The patient has normal affect. Cardiovascular: There is a regular rate and rhythm without significant murmur appreciated.  Vascular: Right forearm is slightly warmer to touch than the left forearm, mild swelling, painful to touch, mild erythema. Bilateral radial pulses are 2+ palpable. Right forearm AVF and left upper arm AVF with palpable thrill. Left upper arm AVF is aneurysmal.    Medical Decision Making  Jon [name] is a 60 y.o. male who presents with  ESRD requiring hemodialysis. He is s/p right radial cephalic AVF creation on 0000000. His right forearm AVF was first accessed 02/06/16 and since then he has had pain, swelling, and fever feeling in the right forearm. He has had an infiltration of blood from his right forearm AVF to the surrounding tissue; this may take about a month to resorb and resolve. Dr. Donnetta Hutching spoke with and examined pt. Dr.  Donnetta Hutching stated it is unlikely, not impossible, that the left AVF is infected. Pt was advised to elevate his right arm as often as possible to facilitate resorption.  Do not use right arm AVF until pt follows up with Dr. Scot Dock in 3-4 weeks, use left upper arm AVF for HD in the interrum.   NICKEL, Sharmon Leyden, RN, MSN, FNP-C [facility] Office: [phone]  02/14/2016, 11:51 AM  Clinic MD: Early

## 2016-02-15 DIAGNOSIS — N186 End stage renal disease: Secondary | ICD-10-CM | POA: Diagnosis not present

## 2016-02-15 DIAGNOSIS — R509 Fever, unspecified: Secondary | ICD-10-CM | POA: Diagnosis not present

## 2016-02-15 DIAGNOSIS — N2581 Secondary hyperparathyroidism of renal origin: Secondary | ICD-10-CM | POA: Diagnosis not present

## 2016-02-15 DIAGNOSIS — E119 Type 2 diabetes mellitus without complications: Secondary | ICD-10-CM | POA: Diagnosis not present

## 2016-02-15 DIAGNOSIS — D509 Iron deficiency anemia, unspecified: Secondary | ICD-10-CM | POA: Diagnosis not present

## 2016-02-16 DIAGNOSIS — E669 Obesity, unspecified: Secondary | ICD-10-CM | POA: Diagnosis not present

## 2016-02-16 DIAGNOSIS — N186 End stage renal disease: Secondary | ICD-10-CM | POA: Diagnosis not present

## 2016-02-16 DIAGNOSIS — Z992 Dependence on renal dialysis: Secondary | ICD-10-CM | POA: Diagnosis not present

## 2016-02-16 DIAGNOSIS — I6509 Occlusion and stenosis of unspecified vertebral artery: Secondary | ICD-10-CM | POA: Diagnosis not present

## 2016-02-16 DIAGNOSIS — R7309 Other abnormal glucose: Secondary | ICD-10-CM | POA: Diagnosis not present

## 2016-02-16 DIAGNOSIS — D649 Anemia, unspecified: Secondary | ICD-10-CM | POA: Diagnosis not present

## 2016-02-16 DIAGNOSIS — I2510 Atherosclerotic heart disease of native coronary artery without angina pectoris: Secondary | ICD-10-CM | POA: Diagnosis not present

## 2016-02-16 DIAGNOSIS — I120 Hypertensive chronic kidney disease with stage 5 chronic kidney disease or end stage renal disease: Secondary | ICD-10-CM | POA: Diagnosis not present

## 2016-02-16 DIAGNOSIS — E785 Hyperlipidemia, unspecified: Secondary | ICD-10-CM | POA: Diagnosis not present

## 2016-02-17 DIAGNOSIS — E119 Type 2 diabetes mellitus without complications: Secondary | ICD-10-CM | POA: Diagnosis not present

## 2016-02-17 DIAGNOSIS — R509 Fever, unspecified: Secondary | ICD-10-CM | POA: Diagnosis not present

## 2016-02-17 DIAGNOSIS — N186 End stage renal disease: Secondary | ICD-10-CM | POA: Diagnosis not present

## 2016-02-17 DIAGNOSIS — D509 Iron deficiency anemia, unspecified: Secondary | ICD-10-CM | POA: Diagnosis not present

## 2016-02-17 DIAGNOSIS — N2581 Secondary hyperparathyroidism of renal origin: Secondary | ICD-10-CM | POA: Diagnosis not present

## 2016-02-20 DIAGNOSIS — R509 Fever, unspecified: Secondary | ICD-10-CM | POA: Diagnosis not present

## 2016-02-20 DIAGNOSIS — N2581 Secondary hyperparathyroidism of renal origin: Secondary | ICD-10-CM | POA: Diagnosis not present

## 2016-02-20 DIAGNOSIS — E119 Type 2 diabetes mellitus without complications: Secondary | ICD-10-CM | POA: Diagnosis not present

## 2016-02-20 DIAGNOSIS — D509 Iron deficiency anemia, unspecified: Secondary | ICD-10-CM | POA: Diagnosis not present

## 2016-02-20 DIAGNOSIS — N186 End stage renal disease: Secondary | ICD-10-CM | POA: Diagnosis not present

## 2016-02-22 DIAGNOSIS — E119 Type 2 diabetes mellitus without complications: Secondary | ICD-10-CM | POA: Diagnosis not present

## 2016-02-22 DIAGNOSIS — N186 End stage renal disease: Secondary | ICD-10-CM | POA: Diagnosis not present

## 2016-02-22 DIAGNOSIS — E1151 Type 2 diabetes mellitus with diabetic peripheral angiopathy without gangrene: Secondary | ICD-10-CM | POA: Diagnosis not present

## 2016-02-22 DIAGNOSIS — R509 Fever, unspecified: Secondary | ICD-10-CM | POA: Diagnosis not present

## 2016-02-22 DIAGNOSIS — N2581 Secondary hyperparathyroidism of renal origin: Secondary | ICD-10-CM | POA: Diagnosis not present

## 2016-02-22 DIAGNOSIS — D509 Iron deficiency anemia, unspecified: Secondary | ICD-10-CM | POA: Diagnosis not present

## 2016-02-24 DIAGNOSIS — R509 Fever, unspecified: Secondary | ICD-10-CM | POA: Diagnosis not present

## 2016-02-24 DIAGNOSIS — N186 End stage renal disease: Secondary | ICD-10-CM | POA: Diagnosis not present

## 2016-02-24 DIAGNOSIS — N2581 Secondary hyperparathyroidism of renal origin: Secondary | ICD-10-CM | POA: Diagnosis not present

## 2016-02-24 DIAGNOSIS — E119 Type 2 diabetes mellitus without complications: Secondary | ICD-10-CM | POA: Diagnosis not present

## 2016-02-24 DIAGNOSIS — D509 Iron deficiency anemia, unspecified: Secondary | ICD-10-CM | POA: Diagnosis not present

## 2016-02-27 DIAGNOSIS — N186 End stage renal disease: Secondary | ICD-10-CM | POA: Diagnosis not present

## 2016-02-27 DIAGNOSIS — R509 Fever, unspecified: Secondary | ICD-10-CM | POA: Diagnosis not present

## 2016-02-27 DIAGNOSIS — Z992 Dependence on renal dialysis: Secondary | ICD-10-CM | POA: Diagnosis not present

## 2016-02-27 DIAGNOSIS — D509 Iron deficiency anemia, unspecified: Secondary | ICD-10-CM | POA: Diagnosis not present

## 2016-02-27 DIAGNOSIS — E119 Type 2 diabetes mellitus without complications: Secondary | ICD-10-CM | POA: Diagnosis not present

## 2016-02-27 DIAGNOSIS — I120 Hypertensive chronic kidney disease with stage 5 chronic kidney disease or end stage renal disease: Secondary | ICD-10-CM | POA: Diagnosis not present

## 2016-02-27 DIAGNOSIS — N2581 Secondary hyperparathyroidism of renal origin: Secondary | ICD-10-CM | POA: Diagnosis not present

## 2016-02-29 DIAGNOSIS — N2581 Secondary hyperparathyroidism of renal origin: Secondary | ICD-10-CM | POA: Diagnosis not present

## 2016-02-29 DIAGNOSIS — N186 End stage renal disease: Secondary | ICD-10-CM | POA: Diagnosis not present

## 2016-02-29 DIAGNOSIS — E119 Type 2 diabetes mellitus without complications: Secondary | ICD-10-CM | POA: Diagnosis not present

## 2016-02-29 DIAGNOSIS — D509 Iron deficiency anemia, unspecified: Secondary | ICD-10-CM | POA: Diagnosis not present

## 2016-03-01 ENCOUNTER — Encounter: Payer: Self-pay | Admitting: Vascular Surgery

## 2016-03-02 DIAGNOSIS — N2581 Secondary hyperparathyroidism of renal origin: Secondary | ICD-10-CM | POA: Diagnosis not present

## 2016-03-02 DIAGNOSIS — D509 Iron deficiency anemia, unspecified: Secondary | ICD-10-CM | POA: Diagnosis not present

## 2016-03-02 DIAGNOSIS — N186 End stage renal disease: Secondary | ICD-10-CM | POA: Diagnosis not present

## 2016-03-02 DIAGNOSIS — E119 Type 2 diabetes mellitus without complications: Secondary | ICD-10-CM | POA: Diagnosis not present

## 2016-03-05 DIAGNOSIS — N186 End stage renal disease: Secondary | ICD-10-CM | POA: Diagnosis not present

## 2016-03-05 DIAGNOSIS — D509 Iron deficiency anemia, unspecified: Secondary | ICD-10-CM | POA: Diagnosis not present

## 2016-03-05 DIAGNOSIS — E119 Type 2 diabetes mellitus without complications: Secondary | ICD-10-CM | POA: Diagnosis not present

## 2016-03-05 DIAGNOSIS — N2581 Secondary hyperparathyroidism of renal origin: Secondary | ICD-10-CM | POA: Diagnosis not present

## 2016-03-07 DIAGNOSIS — N2581 Secondary hyperparathyroidism of renal origin: Secondary | ICD-10-CM | POA: Diagnosis not present

## 2016-03-07 DIAGNOSIS — D509 Iron deficiency anemia, unspecified: Secondary | ICD-10-CM | POA: Diagnosis not present

## 2016-03-07 DIAGNOSIS — E119 Type 2 diabetes mellitus without complications: Secondary | ICD-10-CM | POA: Diagnosis not present

## 2016-03-07 DIAGNOSIS — N186 End stage renal disease: Secondary | ICD-10-CM | POA: Diagnosis not present

## 2016-03-08 ENCOUNTER — Ambulatory Visit: Payer: Medicare Other | Admitting: Vascular Surgery

## 2016-03-09 DIAGNOSIS — D509 Iron deficiency anemia, unspecified: Secondary | ICD-10-CM | POA: Diagnosis not present

## 2016-03-09 DIAGNOSIS — N186 End stage renal disease: Secondary | ICD-10-CM | POA: Diagnosis not present

## 2016-03-09 DIAGNOSIS — N2581 Secondary hyperparathyroidism of renal origin: Secondary | ICD-10-CM | POA: Diagnosis not present

## 2016-03-09 DIAGNOSIS — E119 Type 2 diabetes mellitus without complications: Secondary | ICD-10-CM | POA: Diagnosis not present

## 2016-03-12 DIAGNOSIS — N2581 Secondary hyperparathyroidism of renal origin: Secondary | ICD-10-CM | POA: Diagnosis not present

## 2016-03-12 DIAGNOSIS — D509 Iron deficiency anemia, unspecified: Secondary | ICD-10-CM | POA: Diagnosis not present

## 2016-03-12 DIAGNOSIS — N186 End stage renal disease: Secondary | ICD-10-CM | POA: Diagnosis not present

## 2016-03-12 DIAGNOSIS — E119 Type 2 diabetes mellitus without complications: Secondary | ICD-10-CM | POA: Diagnosis not present

## 2016-03-14 DIAGNOSIS — E119 Type 2 diabetes mellitus without complications: Secondary | ICD-10-CM | POA: Diagnosis not present

## 2016-03-14 DIAGNOSIS — D509 Iron deficiency anemia, unspecified: Secondary | ICD-10-CM | POA: Diagnosis not present

## 2016-03-14 DIAGNOSIS — N2581 Secondary hyperparathyroidism of renal origin: Secondary | ICD-10-CM | POA: Diagnosis not present

## 2016-03-14 DIAGNOSIS — N186 End stage renal disease: Secondary | ICD-10-CM | POA: Diagnosis not present

## 2016-03-16 DIAGNOSIS — D509 Iron deficiency anemia, unspecified: Secondary | ICD-10-CM | POA: Diagnosis not present

## 2016-03-16 DIAGNOSIS — N2581 Secondary hyperparathyroidism of renal origin: Secondary | ICD-10-CM | POA: Diagnosis not present

## 2016-03-16 DIAGNOSIS — N186 End stage renal disease: Secondary | ICD-10-CM | POA: Diagnosis not present

## 2016-03-16 DIAGNOSIS — E119 Type 2 diabetes mellitus without complications: Secondary | ICD-10-CM | POA: Diagnosis not present

## 2016-03-19 DIAGNOSIS — N186 End stage renal disease: Secondary | ICD-10-CM | POA: Diagnosis not present

## 2016-03-19 DIAGNOSIS — N2581 Secondary hyperparathyroidism of renal origin: Secondary | ICD-10-CM | POA: Diagnosis not present

## 2016-03-19 DIAGNOSIS — E119 Type 2 diabetes mellitus without complications: Secondary | ICD-10-CM | POA: Diagnosis not present

## 2016-03-19 DIAGNOSIS — D509 Iron deficiency anemia, unspecified: Secondary | ICD-10-CM | POA: Diagnosis not present

## 2016-03-21 DIAGNOSIS — N186 End stage renal disease: Secondary | ICD-10-CM | POA: Diagnosis not present

## 2016-03-21 DIAGNOSIS — N2581 Secondary hyperparathyroidism of renal origin: Secondary | ICD-10-CM | POA: Diagnosis not present

## 2016-03-21 DIAGNOSIS — D509 Iron deficiency anemia, unspecified: Secondary | ICD-10-CM | POA: Diagnosis not present

## 2016-03-21 DIAGNOSIS — E119 Type 2 diabetes mellitus without complications: Secondary | ICD-10-CM | POA: Diagnosis not present

## 2016-03-23 DIAGNOSIS — D509 Iron deficiency anemia, unspecified: Secondary | ICD-10-CM | POA: Diagnosis not present

## 2016-03-23 DIAGNOSIS — E119 Type 2 diabetes mellitus without complications: Secondary | ICD-10-CM | POA: Diagnosis not present

## 2016-03-23 DIAGNOSIS — N2581 Secondary hyperparathyroidism of renal origin: Secondary | ICD-10-CM | POA: Diagnosis not present

## 2016-03-23 DIAGNOSIS — N186 End stage renal disease: Secondary | ICD-10-CM | POA: Diagnosis not present

## 2016-03-26 DIAGNOSIS — D509 Iron deficiency anemia, unspecified: Secondary | ICD-10-CM | POA: Diagnosis not present

## 2016-03-26 DIAGNOSIS — N186 End stage renal disease: Secondary | ICD-10-CM | POA: Diagnosis not present

## 2016-03-26 DIAGNOSIS — N2581 Secondary hyperparathyroidism of renal origin: Secondary | ICD-10-CM | POA: Diagnosis not present

## 2016-03-26 DIAGNOSIS — E119 Type 2 diabetes mellitus without complications: Secondary | ICD-10-CM | POA: Diagnosis not present

## 2016-03-28 DIAGNOSIS — E119 Type 2 diabetes mellitus without complications: Secondary | ICD-10-CM | POA: Diagnosis not present

## 2016-03-28 DIAGNOSIS — N2581 Secondary hyperparathyroidism of renal origin: Secondary | ICD-10-CM | POA: Diagnosis not present

## 2016-03-28 DIAGNOSIS — N186 End stage renal disease: Secondary | ICD-10-CM | POA: Diagnosis not present

## 2016-03-28 DIAGNOSIS — D509 Iron deficiency anemia, unspecified: Secondary | ICD-10-CM | POA: Diagnosis not present

## 2016-03-29 DIAGNOSIS — Z992 Dependence on renal dialysis: Secondary | ICD-10-CM | POA: Diagnosis not present

## 2016-03-29 DIAGNOSIS — I120 Hypertensive chronic kidney disease with stage 5 chronic kidney disease or end stage renal disease: Secondary | ICD-10-CM | POA: Diagnosis not present

## 2016-03-29 DIAGNOSIS — N186 End stage renal disease: Secondary | ICD-10-CM | POA: Diagnosis not present

## 2016-03-30 ENCOUNTER — Encounter (HOSPITAL_COMMUNITY): Payer: Self-pay | Admitting: Emergency Medicine

## 2016-03-30 ENCOUNTER — Emergency Department (HOSPITAL_COMMUNITY)
Admission: EM | Admit: 2016-03-30 | Discharge: 2016-03-30 | Disposition: A | Discharge status: Home / Self Care | Payer: Medicare Other | Attending: Emergency Medicine | Admitting: Emergency Medicine

## 2016-03-30 DIAGNOSIS — Z992 Dependence on renal dialysis: Secondary | ICD-10-CM | POA: Diagnosis not present

## 2016-03-30 DIAGNOSIS — R0789 Other chest pain: Secondary | ICD-10-CM | POA: Diagnosis not present

## 2016-03-30 DIAGNOSIS — Z7982 Long term (current) use of aspirin: Secondary | ICD-10-CM | POA: Diagnosis not present

## 2016-03-30 DIAGNOSIS — Z23 Encounter for immunization: Secondary | ICD-10-CM | POA: Diagnosis not present

## 2016-03-30 DIAGNOSIS — N186 End stage renal disease: Secondary | ICD-10-CM | POA: Diagnosis not present

## 2016-03-30 DIAGNOSIS — I120 Hypertensive chronic kidney disease with stage 5 chronic kidney disease or end stage renal disease: Secondary | ICD-10-CM | POA: Diagnosis not present

## 2016-03-30 DIAGNOSIS — D509 Iron deficiency anemia, unspecified: Secondary | ICD-10-CM | POA: Diagnosis not present

## 2016-03-30 DIAGNOSIS — N2581 Secondary hyperparathyroidism of renal origin: Secondary | ICD-10-CM | POA: Diagnosis not present

## 2016-03-30 DIAGNOSIS — E119 Type 2 diabetes mellitus without complications: Secondary | ICD-10-CM | POA: Diagnosis not present

## 2016-03-30 DIAGNOSIS — R079 Chest pain, unspecified: Secondary | ICD-10-CM | POA: Insufficient documentation

## 2016-03-30 DIAGNOSIS — I252 Old myocardial infarction: Secondary | ICD-10-CM | POA: Insufficient documentation

## 2016-03-30 DIAGNOSIS — Z79899 Other long term (current) drug therapy: Secondary | ICD-10-CM | POA: Insufficient documentation

## 2016-03-30 DIAGNOSIS — D631 Anemia in chronic kidney disease: Secondary | ICD-10-CM | POA: Diagnosis not present

## 2016-03-30 LAB — CBC WITH DIFFERENTIAL/PLATELET
Basophils Absolute: 0 10*3/uL (ref 0.0–0.1)
Basophils Relative: 0 %
EOS PCT: 3 %
Eosinophils Absolute: 0.2 10*3/uL (ref 0.0–0.7)
HEMATOCRIT: 41.1 % (ref 39.0–52.0)
Hemoglobin: 13.1 g/dL (ref 13.0–17.0)
LYMPHS ABS: 2.1 10*3/uL (ref 0.7–4.0)
LYMPHS PCT: 30 %
MCH: 30.6 pg (ref 26.0–34.0)
MCHC: 31.9 g/dL (ref 30.0–36.0)
MCV: 96 fL (ref 78.0–100.0)
MONO ABS: 0.9 10*3/uL (ref 0.1–1.0)
Monocytes Relative: 13 %
NEUTROS ABS: 3.8 10*3/uL (ref 1.7–7.7)
Neutrophils Relative %: 54 %
PLATELETS: 129 10*3/uL — AB (ref 150–400)
RBC: 4.28 MIL/uL (ref 4.22–5.81)
RDW: 15.1 % (ref 11.5–15.5)
WBC: 7.1 10*3/uL (ref 4.0–10.5)

## 2016-03-30 LAB — BASIC METABOLIC PANEL
ANION GAP: 12 (ref 5–15)
BUN: 21 mg/dL — AB (ref 6–20)
CO2: 30 mmol/L (ref 22–32)
Calcium: 8.5 mg/dL — ABNORMAL LOW (ref 8.9–10.3)
Chloride: 94 mmol/L — ABNORMAL LOW (ref 101–111)
Creatinine, Ser: 9.19 mg/dL — ABNORMAL HIGH (ref 0.61–1.24)
GFR calc Af Amer: 6 mL/min — ABNORMAL LOW (ref >60)
GFR, EST NON AFRICAN AMERICAN: 6 mL/min — AB (ref >60)
GLUCOSE: 95 mg/dL (ref 65–99)
POTASSIUM: 3.3 mmol/L — AB (ref 3.5–5.1)
Sodium: 136 mmol/L (ref 135–145)

## 2016-03-30 LAB — TROPONIN I
Troponin I: 0.09 ng/mL (ref <0.03)
Troponin I: 0.08 ng/mL (ref <0.03)

## 2016-03-30 MED ORDER — ASPIRIN 81 MG PO CHEW: 324.0000 mg | CHEWABLE_TABLET | Freq: Once | ORAL | Status: DC

## 2016-03-30 NOTE — ED Notes (Signed)
MD nanavita made aware pt has dialysis cathter accessed on left upper arm and new graft on right lower arm. No IV started or blood drawn at this time. MD states he will assess pt first and determine most appropriate site for IV/blood draw.

## 2016-03-30 NOTE — ED Provider Notes (Signed)
[facility] DEPT Provider Note   CSN: [id number] Arrival date & time: 03/30/16  [id number]     History   Chief Complaint Chief Complaint  Patient presents with  . Chest Pain    HPI Jon [name] is a 60 y.o. male.  HPI  PT with hx of ESRD on HD (m/w/f) who  Comes in with cc of chest pain. Chest pain is L sided while having his HD. Chest pain was L sided, intermittent, sharp. Pt has had similar pain, while getting HD. He hasnt had any pain with exertion. No associated dib, nausea, palpitations, dizziness. No smoking or drug hx.  Past Medical History:  Diagnosis Date  . Chronic kidney disease   . Dialysis patient ([place])   . GERD (gastroesophageal reflux disease)   . History of blood transfusion   . Hyperlipidemia    takes Atorvastatin daily  . Hypertension    takes Amlodipine,Losartan,and Labetalol daily  . Myocardial infarction [facility])    lived in [place] maybe 7 years ago    Patient Active Problem List   Diagnosis Date Noted  . Pulmonary edema 05/01/2015  . Acute respiratory failure with hypoxia ([place]) 05/01/2015  . Hypertensive urgency 05/01/2015  . Chronic anemia 05/01/2015  . Acute pulmonary edema ([facility])   . Chest pain   . Pain in limb- Right neck 02/23/2013  . End stage renal disease ([place]) 01/26/2013  . ESRD (end stage renal disease) on dialysis ([place]) 05/07/2012  . History of small bowel obstruction 05/07/2012  . History of diverticulitis of colon 05/07/2012  . HTN (hypertension) 05/07/2012    Past Surgical History:  Procedure Laterality Date  . ARTERIOVENOUS GRAFT PLACEMENT    . AV FISTULA PLACEMENT Right 10/25/2015   Procedure: ARTERIOVENOUS (AV) FISTULA CREATION RIGHT FOREARM;  Surgeon: Angelia Mould, MD;  Location: [facility];  Service: Vascular;  Laterality: Right;  . CARDIAC CATHETERIZATION N/A 05/05/2015   Procedure: Left Heart Cath and Coronary Angiography;  Surgeon: Charolette Forward, MD;  Location: [place] CV LAB;  Service: Cardiovascular;  Laterality:  N/A;  . PERIPHERAL VASCULAR CATHETERIZATION N/A 08/18/2015   Procedure: Fistulagram;  Surgeon: Conrad [place], MD;  Location: [place] CV LAB;  Service: Cardiovascular;  Laterality: N/A;  . RESECTION OF ARTERIOVENOUS FISTULA ANEURYSM Left 02/05/2013   Procedure: REPAIR OF ANEURYSM OF LEFT ARM ARTERIOVENOUS FISTULA ;  Surgeon: Serafina Mitchell, MD;  Location: [place] [place];  Service: Vascular;  Laterality: Left;  . RESECTION OF ARTERIOVENOUS FISTULA ANEURYSM Left 03/08/2015   Procedure: REPAIR OF LEFT ARTERIOVENOUS FISTULA PSEUDOANEURYSM;  Surgeon: Angelia Mould, MD;  Location: [facility];  Service: Vascular;  Laterality: Left;  . REVISON OF ARTERIOVENOUS FISTULA Left 0000000   Procedure: PLICATION OF A LARGE ANEURYSM LEFT UPPER ARM BRACHIO-CEPHALIC ARTERIOVENOUS FISTULA ;  Surgeon: Angelia Mould, MD;  Location: [facility];  Service: Vascular;  Laterality: Left;  . SMALL INTESTINE SURGERY    . SMALL INTESTINE SURGERY         Home Medications    Prior to Admission medications   Medication Sig Start Date End Date Taking? Authorizing Provider  amLODipine (NORVASC) 10 MG tablet Take 10 mg by mouth at bedtime.   Yes Historical Provider, MD  aspirin EC 81 MG EC tablet Take 1 tablet (81 mg total) by mouth daily. 05/30/13  Yes Charolette Forward, MD  atorvastatin (LIPITOR) 20 MG tablet Take 1 tablet (20 mg total) by mouth daily at 6 PM. 05/30/13  Yes Charolette Forward, MD  carvedilol (COREG) 25  MG tablet Take 25 mg by mouth 2 (two) times daily.   Yes Historical Provider, MD  labetalol (NORMODYNE) 200 MG tablet Take 1 tablet (200 mg total) by mouth 2 (two) times daily. 05/06/15  Yes Modena Jansky, MD  losartan (COZAAR) 100 MG tablet Take 1 tablet (100 mg total) by mouth at bedtime. 05/06/15  Yes Modena Jansky, MD  multivitamin (RENA-VIT) TABS tablet Take 1 tablet by mouth at bedtime.   Yes Historical Provider, MD  oxyCODONE (ROXICODONE) 5 MG immediate release tablet Take 1 tablet (5 mg total) by mouth every 6  (six) hours as needed. 10/25/15  Yes Samantha J Rhyne, PA-C  PHOSLYRA 667 MG/5ML SOLN Take 15 mLs by mouth 3 (three) times daily with meals. 09/30/15  Yes Historical Provider, MD  spironolactone (ALDACTONE) 25 MG tablet Take 25 mg by mouth daily.   Yes Historical Provider, MD  nitroGLYCERIN (NITROSTAT) 0.4 MG SL tablet Place 1 tablet (0.4 mg total) under the tongue every 5 (five) minutes x 3 doses as needed for chest pain. 05/30/13   Charolette Forward, MD    Family History Family History  Problem Relation Age of Onset  . Hypertension Mother   . Other Mother     varicose veins  . Diabetes Father   . Hypertension Father   . Hypertension Sister   . Other Sister     varicose veins  . Other Brother     varicose veins    Social History Social History  Substance Use Topics  . Smoking status: Never Smoker  . Smokeless tobacco: Never Used  . Alcohol use No     Allergies   Review of patient's allergies indicates no known allergies.   Review of Systems Review of Systems  ROS 10 Systems reviewed and are negative for acute change except as noted in the HPI.     Physical Exam Updated Vital Signs BP 131/63   Pulse 72   Temp 98.2 F (36.8 C) (Oral)   Resp 13   Ht 6' (1.829 m)   Wt 217 lb (98.4 kg)   SpO2 100%   BMI 29.43 kg/m   Physical Exam  Constitutional: He is oriented to person, place, and time. He appears well-developed.  HENT:  Head: Normocephalic and atraumatic.  Eyes: Conjunctivae and EOM are normal. Pupils are equal, round, and reactive to light.  Neck: Normal range of motion. Neck supple.  Cardiovascular: Normal rate, regular rhythm and normal heart sounds.   Pulmonary/Chest: Effort normal and breath sounds normal. No respiratory distress. He has no wheezes.  Abdominal: Soft. Bowel sounds are normal. He exhibits no distension. There is no tenderness. There is no rebound and no guarding.  Neurological: He is alert and oriented to person, place, and time.  Skin:  Skin is warm.     ED Treatments / Results  Labs (all labs ordered are listed, but only abnormal results are displayed) Labs Reviewed  CBC WITH DIFFERENTIAL/PLATELET - Abnormal; Notable for the following:       Result Value   Platelets 129 (*)    All other components within normal limits  BASIC METABOLIC PANEL - Abnormal; Notable for the following:    Potassium 3.3 (*)    Chloride 94 (*)    BUN 21 (*)    Creatinine, Ser 9.19 (*)    Calcium 8.5 (*)    GFR calc non Af Amer 6 (*)    GFR calc Af Amer 6 (*)    All other  components within normal limits  TROPONIN I - Abnormal; Notable for the following:    Troponin I 0.09 (*)    All other components within normal limits    EKG  EKG Interpretation  Date/Time:  Friday March 30 2016 09:28:57 EDT Ventricular Rate:  78 PR Interval:    QRS Duration: 113 QT Interval:  434 QTC Calculation: 495 R Axis:   -30 Text Interpretation:  Sinus rhythm Abnormal R-wave progression, late transition LVH with secondary repolarization abnormality Borderline prolonged QT interval T wave flattening in leaf II and inversion in V2 - new No acute changes Nonspecific ST and T wave abnormality Confirmed by Kathrynn Humble, MD, Thelma Comp [id number]) on 03/30/2016 9:40:58 AM       Radiology No results found.  Procedures Procedures (including critical care time)   Medications Ordered in ED Medications  aspirin chewable tablet 324 mg (324 mg Oral Not Given 03/30/16 1015)     Initial Impression / Assessment and Plan / ED Course  I have reviewed the triage vital signs and the nursing notes.  Pertinent labs & imaging results that were available during my care of the patient were reviewed by me and considered in my medical decision making (see chart for details).  Clinical Course  Comment By Time  04/2015 cath report is as below: Left Heart Cath and Coronary Angiography Conclusion    Prox LAD to Mid LAD lesion, 10% stenosed.  The left ventricular systolic  function is normal.   Varney Biles, MD 09/01 1135    Pt comes in with L sided chest pain below pec muscle. Chest pain is constant, non radiating and atypical. He has had similar pain in the past with dialysis. No pain with exertion. Pt is not a smoker, no DM, no drug use. He had a cath < 1 year ago - was normal. The pain could be ischemic, but is not due to obstructive disease - so we will get trops x 2. I will touch base with Dr. Terrence Dupont as well - but unless Cardiology wants patient to be admitted, or the troponins are rising precipitously, we anticipate d/c. Final Clinical Impressions(s) / ED Diagnoses   Final diagnoses:  Chest pain, unspecified chest pain type  ESRD (end stage renal disease) on dialysis [facility])    New Prescriptions New Prescriptions   No medications on file     Varney Biles, MD 03/30/16 1139

## 2016-03-30 NOTE — ED Triage Notes (Signed)
Pt was 2 hrs into dialysis treatment today when he has a sudden onset of sharp chest pains that also caused him to become diaphoretic. Pt arrives to ed with dialysis cathter accessed.

## 2016-03-30 NOTE — ED Notes (Signed)
Dr. Kathrynn Humble states to draw from foot for labs.

## 2016-03-30 NOTE — ED Notes (Addendum)
EKG done and given to dr.nanavita 0928.

## 2016-03-30 NOTE — Discharge Instructions (Signed)
We saw you in the ER for the chest pain. All of our cardiac workup is reassuring. We are not sure what is causing your discomfort, but we feel comfortable sending you home at this time. The workup in the ER is not complete, and you should follow up [facility] - who asked you to call his office to set up an appointment.  Please return to the ER if you have worsening chest pain, shortness of breath, pain radiating to your jaw, shoulder, or back, sweats or fainting. Otherwise see the Cardiologist or your primary care doctor as requested.

## 2016-04-02 DIAGNOSIS — D509 Iron deficiency anemia, unspecified: Secondary | ICD-10-CM | POA: Diagnosis not present

## 2016-04-02 DIAGNOSIS — E119 Type 2 diabetes mellitus without complications: Secondary | ICD-10-CM | POA: Diagnosis not present

## 2016-04-02 DIAGNOSIS — Z23 Encounter for immunization: Secondary | ICD-10-CM | POA: Diagnosis not present

## 2016-04-02 DIAGNOSIS — N2581 Secondary hyperparathyroidism of renal origin: Secondary | ICD-10-CM | POA: Diagnosis not present

## 2016-04-02 DIAGNOSIS — D631 Anemia in chronic kidney disease: Secondary | ICD-10-CM | POA: Diagnosis not present

## 2016-04-02 DIAGNOSIS — N186 End stage renal disease: Secondary | ICD-10-CM | POA: Diagnosis not present

## 2016-04-04 DIAGNOSIS — D631 Anemia in chronic kidney disease: Secondary | ICD-10-CM | POA: Diagnosis not present

## 2016-04-04 DIAGNOSIS — N2581 Secondary hyperparathyroidism of renal origin: Secondary | ICD-10-CM | POA: Diagnosis not present

## 2016-04-04 DIAGNOSIS — Z23 Encounter for immunization: Secondary | ICD-10-CM | POA: Diagnosis not present

## 2016-04-04 DIAGNOSIS — E119 Type 2 diabetes mellitus without complications: Secondary | ICD-10-CM | POA: Diagnosis not present

## 2016-04-04 DIAGNOSIS — D509 Iron deficiency anemia, unspecified: Secondary | ICD-10-CM | POA: Diagnosis not present

## 2016-04-04 DIAGNOSIS — N186 End stage renal disease: Secondary | ICD-10-CM | POA: Diagnosis not present

## 2016-04-05 ENCOUNTER — Encounter: Payer: Self-pay | Admitting: Vascular Surgery

## 2016-04-06 DIAGNOSIS — D509 Iron deficiency anemia, unspecified: Secondary | ICD-10-CM | POA: Diagnosis not present

## 2016-04-06 DIAGNOSIS — N2581 Secondary hyperparathyroidism of renal origin: Secondary | ICD-10-CM | POA: Diagnosis not present

## 2016-04-06 DIAGNOSIS — Z23 Encounter for immunization: Secondary | ICD-10-CM | POA: Diagnosis not present

## 2016-04-06 DIAGNOSIS — E119 Type 2 diabetes mellitus without complications: Secondary | ICD-10-CM | POA: Diagnosis not present

## 2016-04-06 DIAGNOSIS — N186 End stage renal disease: Secondary | ICD-10-CM | POA: Diagnosis not present

## 2016-04-06 DIAGNOSIS — D631 Anemia in chronic kidney disease: Secondary | ICD-10-CM | POA: Diagnosis not present

## 2016-04-09 DIAGNOSIS — E119 Type 2 diabetes mellitus without complications: Secondary | ICD-10-CM | POA: Diagnosis not present

## 2016-04-09 DIAGNOSIS — N2581 Secondary hyperparathyroidism of renal origin: Secondary | ICD-10-CM | POA: Diagnosis not present

## 2016-04-09 DIAGNOSIS — Z23 Encounter for immunization: Secondary | ICD-10-CM | POA: Diagnosis not present

## 2016-04-09 DIAGNOSIS — N186 End stage renal disease: Secondary | ICD-10-CM | POA: Diagnosis not present

## 2016-04-09 DIAGNOSIS — D631 Anemia in chronic kidney disease: Secondary | ICD-10-CM | POA: Diagnosis not present

## 2016-04-09 DIAGNOSIS — D509 Iron deficiency anemia, unspecified: Secondary | ICD-10-CM | POA: Diagnosis not present

## 2016-04-11 ENCOUNTER — Other Ambulatory Visit: Payer: Self-pay

## 2016-04-11 ENCOUNTER — Encounter: Payer: Self-pay | Admitting: Vascular Surgery

## 2016-04-11 ENCOUNTER — Ambulatory Visit (INDEPENDENT_AMBULATORY_CARE_PROVIDER_SITE_OTHER): Payer: Medicare Other | Admitting: Vascular Surgery

## 2016-04-11 VITALS — BP 147/80 | HR 93 | Temp 99.0°F | Resp 16 | Ht 72.0 in | Wt 227.0 lb

## 2016-04-11 DIAGNOSIS — N186 End stage renal disease: Secondary | ICD-10-CM | POA: Diagnosis not present

## 2016-04-11 DIAGNOSIS — Z992 Dependence on renal dialysis: Secondary | ICD-10-CM | POA: Diagnosis not present

## 2016-04-11 DIAGNOSIS — E119 Type 2 diabetes mellitus without complications: Secondary | ICD-10-CM | POA: Diagnosis not present

## 2016-04-11 DIAGNOSIS — Z23 Encounter for immunization: Secondary | ICD-10-CM | POA: Diagnosis not present

## 2016-04-11 DIAGNOSIS — N2581 Secondary hyperparathyroidism of renal origin: Secondary | ICD-10-CM | POA: Diagnosis not present

## 2016-04-11 DIAGNOSIS — D509 Iron deficiency anemia, unspecified: Secondary | ICD-10-CM | POA: Diagnosis not present

## 2016-04-11 DIAGNOSIS — D631 Anemia in chronic kidney disease: Secondary | ICD-10-CM | POA: Diagnosis not present

## 2016-04-11 NOTE — Progress Notes (Signed)
Patient name: Jon [name] MRN: [id number] DOB: [birth year] Sex: male  REASON FOR VISIT: Follow up of right radiocephalic AV fistula  HPI: Jon [name] is a 60 y.o. male Y last saw on 12/07/2015. He had a right radiocephalic fistula placed on 10/25/2015. He was still dialyzing with his aneurysmal left arm fistula. I felt that the fistula could be used starting in June 2017 and we would follow up as needed. Since I saw him last, they continue to use his left upper arm fistula as he had problems with his right arm fistula. He denies pain or paresthesias in the right upper extremity.  He dialyzes on Monday Wednesdays and Fridays at 6 AM.  Current Outpatient Prescriptions  Medication Sig Dispense Refill  . amLODipine (NORVASC) 10 MG tablet Take 10 mg by mouth at bedtime.    Marland Kitchen aspirin EC 81 MG EC tablet Take 1 tablet (81 mg total) by mouth daily. 30 tablet 3  . atorvastatin (LIPITOR) 20 MG tablet Take 1 tablet (20 mg total) by mouth daily at 6 PM. 30 tablet 3  . carvedilol (COREG) 25 MG tablet Take 25 mg by mouth 2 (two) times daily.    Marland Kitchen labetalol (NORMODYNE) 200 MG tablet Take 1 tablet (200 mg total) by mouth 2 (two) times daily. 60 tablet 0  . losartan (COZAAR) 100 MG tablet Take 1 tablet (100 mg total) by mouth at bedtime. 30 tablet 0  . multivitamin (RENA-VIT) TABS tablet Take 1 tablet by mouth at bedtime.    . nitroGLYCERIN (NITROSTAT) 0.4 MG SL tablet Place 1 tablet (0.4 mg total) under the tongue every 5 (five) minutes x 3 doses as needed for chest pain. 25 tablet 12  . oxyCODONE (ROXICODONE) 5 MG immediate release tablet Take 1 tablet (5 mg total) by mouth every 6 (six) hours as needed. 20 tablet 0  . PHOSLYRA 667 MG/5ML SOLN Take 15 mLs by mouth 3 (three) times daily with meals.    Marland Kitchen spironolactone (ALDACTONE) 25 MG tablet Take 25 mg by mouth daily.     No current facility-administered medications for this visit.     REVIEW OF SYSTEMS:  [X]  denotes positive finding, [ ]  denotes  negative finding Cardiac  Comments:  Chest pain or chest pressure:    Shortness of breath upon exertion:    Short of breath when lying flat:    Irregular heart rhythm:    Constitutional    Fever or chills:      PHYSICAL EXAM: Vitals:   04/11/16 1526  BP: (!) 147/80  Pulse: 93  Resp: 16  Temp: 99 F (37.2 C)  TempSrc: Oral  SpO2: 96%  Weight: 227 lb (103 kg)  Height: 6' (1.829 m)    GENERAL: The patient is a well-nourished male, in no acute distress. The vital signs are documented above. CARDIOVASCULAR: There is a regular rate and rhythm. PULMONARY: There is good air exchange bilaterally without wheezing or rales. He has a good thrill in the proximal fistula and then the fistulas more difficult to follow. The vein has not enlarged significantly since I saw him back in May. He appears to have some competing branches.  I reviewed his previous duplex scan from May and he does have some competing branches but no significant areas of stenosis.  MEDICAL ISSUES:  POORLY MATURING RIGHT RADIOCEPHALIC AV FISTULA: I think that his fistula is likely poorly maturing because of some competing branches. However, before scheduling ligation of his competing branches I think it would  be worth getting a fistulogram to rule out any other problems that might need to be addressed at the same time. He dialyzes on Monday Wednesdays and Fridays at 6 and still have to schedule this on a Tuesday or Thursday. Based on these results he can be scheduled for ligation of his competing branches and possible revision of his fistula by me on a nondialysis day. I have discussed the procedure potential complications with the patient and he is agreeable to proceed.  Jon [name] [facility] [phone]

## 2016-04-13 DIAGNOSIS — N186 End stage renal disease: Secondary | ICD-10-CM | POA: Diagnosis not present

## 2016-04-13 DIAGNOSIS — N2581 Secondary hyperparathyroidism of renal origin: Secondary | ICD-10-CM | POA: Diagnosis not present

## 2016-04-13 DIAGNOSIS — D509 Iron deficiency anemia, unspecified: Secondary | ICD-10-CM | POA: Diagnosis not present

## 2016-04-13 DIAGNOSIS — D631 Anemia in chronic kidney disease: Secondary | ICD-10-CM | POA: Diagnosis not present

## 2016-04-13 DIAGNOSIS — E119 Type 2 diabetes mellitus without complications: Secondary | ICD-10-CM | POA: Diagnosis not present

## 2016-04-13 DIAGNOSIS — Z23 Encounter for immunization: Secondary | ICD-10-CM | POA: Diagnosis not present

## 2016-04-16 DIAGNOSIS — N2581 Secondary hyperparathyroidism of renal origin: Secondary | ICD-10-CM | POA: Diagnosis not present

## 2016-04-16 DIAGNOSIS — Z23 Encounter for immunization: Secondary | ICD-10-CM | POA: Diagnosis not present

## 2016-04-16 DIAGNOSIS — D631 Anemia in chronic kidney disease: Secondary | ICD-10-CM | POA: Diagnosis not present

## 2016-04-16 DIAGNOSIS — D509 Iron deficiency anemia, unspecified: Secondary | ICD-10-CM | POA: Diagnosis not present

## 2016-04-16 DIAGNOSIS — N186 End stage renal disease: Secondary | ICD-10-CM | POA: Diagnosis not present

## 2016-04-16 DIAGNOSIS — E119 Type 2 diabetes mellitus without complications: Secondary | ICD-10-CM | POA: Diagnosis not present

## 2016-04-18 DIAGNOSIS — Z23 Encounter for immunization: Secondary | ICD-10-CM | POA: Diagnosis not present

## 2016-04-18 DIAGNOSIS — E119 Type 2 diabetes mellitus without complications: Secondary | ICD-10-CM | POA: Diagnosis not present

## 2016-04-18 DIAGNOSIS — N186 End stage renal disease: Secondary | ICD-10-CM | POA: Diagnosis not present

## 2016-04-18 DIAGNOSIS — D509 Iron deficiency anemia, unspecified: Secondary | ICD-10-CM | POA: Diagnosis not present

## 2016-04-18 DIAGNOSIS — N2581 Secondary hyperparathyroidism of renal origin: Secondary | ICD-10-CM | POA: Diagnosis not present

## 2016-04-18 DIAGNOSIS — D631 Anemia in chronic kidney disease: Secondary | ICD-10-CM | POA: Diagnosis not present

## 2016-04-19 ENCOUNTER — Encounter (HOSPITAL_COMMUNITY): Payer: Self-pay | Admitting: Vascular Surgery

## 2016-04-19 ENCOUNTER — Encounter (HOSPITAL_COMMUNITY)
Admission: RE | Disposition: A | Discharge status: Home / Self Care | Payer: Self-pay | Source: Ambulatory Visit | Attending: Vascular Surgery

## 2016-04-19 ENCOUNTER — Telehealth: Payer: Self-pay | Admitting: Vascular Surgery

## 2016-04-19 ENCOUNTER — Ambulatory Visit (HOSPITAL_COMMUNITY)
Admission: RE | Admit: 2016-04-19 | Discharge: 2016-04-19 | Disposition: A | Discharge status: Home / Self Care | Payer: Medicare Other | Source: Ambulatory Visit | Attending: Vascular Surgery | Admitting: Vascular Surgery

## 2016-04-19 DIAGNOSIS — Z7982 Long term (current) use of aspirin: Secondary | ICD-10-CM | POA: Diagnosis not present

## 2016-04-19 DIAGNOSIS — T82898A Other specified complication of vascular prosthetic devices, implants and grafts, initial encounter: Secondary | ICD-10-CM | POA: Insufficient documentation

## 2016-04-19 DIAGNOSIS — Y832 Surgical operation with anastomosis, bypass or graft as the cause of abnormal reaction of the patient, or of later complication, without mention of misadventure at the time of the procedure: Secondary | ICD-10-CM | POA: Diagnosis not present

## 2016-04-19 DIAGNOSIS — Z992 Dependence on renal dialysis: Secondary | ICD-10-CM | POA: Insufficient documentation

## 2016-04-19 DIAGNOSIS — N186 End stage renal disease: Secondary | ICD-10-CM | POA: Diagnosis not present

## 2016-04-19 HISTORY — PX: PERIPHERAL VASCULAR CATHETERIZATION: SHX172C

## 2016-04-19 LAB — POCT I-STAT, CHEM 8
BUN: 26 mg/dL — AB (ref 6–20)
CALCIUM ION: 0.98 mmol/L — AB (ref 1.15–1.40)
CREATININE: 13.2 mg/dL — AB (ref 0.61–1.24)
Chloride: 94 mmol/L — ABNORMAL LOW (ref 101–111)
GLUCOSE: 83 mg/dL (ref 65–99)
HCT: 40 % (ref 39.0–52.0)
Hemoglobin: 13.6 g/dL (ref 13.0–17.0)
Potassium: 4.5 mmol/L (ref 3.5–5.1)
Sodium: 137 mmol/L (ref 135–145)
TCO2: 32 mmol/L (ref 0–100)

## 2016-04-19 SURGERY — A/V SHUNTOGRAM/FISTULAGRAM
Anesthesia: LOCAL | Laterality: Right

## 2016-04-19 MED ORDER — SODIUM CHLORIDE 0.9% FLUSH: 3.0000 mL | INTRAVENOUS | Status: DC | PRN

## 2016-04-19 MED ORDER — IODIXANOL 320 MG/ML IV SOLN
INTRAVENOUS | Status: DC | PRN
  Administered 2016-04-19: 45 mL via INTRAVENOUS

## 2016-04-19 MED ORDER — LIDOCAINE HCL (PF) 1 % IJ SOLN
INTRAMUSCULAR | Status: DC | PRN
  Administered 2016-04-19: 2 mL via SUBCUTANEOUS

## 2016-04-19 MED ORDER — HEPARIN (PORCINE) IN NACL 2-0.9 UNIT/ML-% IJ SOLN
INTRAMUSCULAR | Status: DC | PRN
  Administered 2016-04-19: 500 mL

## 2016-04-19 MED ORDER — LIDOCAINE HCL (PF) 1 % IJ SOLN
INTRAMUSCULAR | Status: AC
  Filled 2016-04-19: qty 30

## 2016-04-19 MED ORDER — SODIUM CHLORIDE 0.9 % IV SOLN: 250.0000 mL | INTRAVENOUS | Status: DC | PRN

## 2016-04-19 MED ORDER — SODIUM CHLORIDE 0.9% FLUSH: 3.0000 mL | Freq: Two times a day (BID) | INTRAVENOUS | Status: DC

## 2016-04-19 SURGICAL SUPPLY — 9 items
COVER DOME SNAP 22 D (MISCELLANEOUS) ×2 IMPLANT
COVER PRB 48X5XTLSCP FOLD TPE (BAG) ×1 IMPLANT
COVER PROBE 5X48 (BAG) ×1
KIT MICROINTRODUCER STIFF 5F (SHEATH) ×2 IMPLANT
PROTECTION STATION PRESSURIZED (MISCELLANEOUS) ×2
STATION PROTECTION PRESSURIZED (MISCELLANEOUS) ×1 IMPLANT
STOPCOCK MORSE 400PSI 3WAY (MISCELLANEOUS) ×2 IMPLANT
TRAY PV CATH (CUSTOM PROCEDURE TRAY) ×2 IMPLANT
TUBING CIL FLEX 10 FLL-RA (TUBING) ×2 IMPLANT

## 2016-04-19 NOTE — H&P (View-Only) (Signed)
Patient name: Jon [name] MRN: [id number] DOB: Oct 10, 1955 Sex: male  REASON FOR VISIT: Follow up of right radiocephalic AV fistula  HPI: Jon [name] is a 60 y.o. male Y last saw on 12/07/2015. He had a right radiocephalic fistula placed on 10/25/2015. He was still dialyzing with his aneurysmal left arm fistula. I felt that the fistula could be used starting in June 2017 and we would follow up as needed. Since I saw him last, they continue to use his left upper arm fistula as he had problems with his right arm fistula. He denies pain or paresthesias in the right upper extremity.  He dialyzes on Monday Wednesdays and Fridays at 6 AM.  Current Outpatient Prescriptions  Medication Sig Dispense Refill  . amLODipine (NORVASC) 10 MG tablet Take 10 mg by mouth at bedtime.    Marland Kitchen aspirin EC 81 MG EC tablet Take 1 tablet (81 mg total) by mouth daily. 30 tablet 3  . atorvastatin (LIPITOR) 20 MG tablet Take 1 tablet (20 mg total) by mouth daily at 6 PM. 30 tablet 3  . carvedilol (COREG) 25 MG tablet Take 25 mg by mouth 2 (two) times daily.    Marland Kitchen labetalol (NORMODYNE) 200 MG tablet Take 1 tablet (200 mg total) by mouth 2 (two) times daily. 60 tablet 0  . losartan (COZAAR) 100 MG tablet Take 1 tablet (100 mg total) by mouth at bedtime. 30 tablet 0  . multivitamin (RENA-VIT) TABS tablet Take 1 tablet by mouth at bedtime.    . nitroGLYCERIN (NITROSTAT) 0.4 MG SL tablet Place 1 tablet (0.4 mg total) under the tongue every 5 (five) minutes x 3 doses as needed for chest pain. 25 tablet 12  . oxyCODONE (ROXICODONE) 5 MG immediate release tablet Take 1 tablet (5 mg total) by mouth every 6 (six) hours as needed. 20 tablet 0  . PHOSLYRA 667 MG/5ML SOLN Take 15 mLs by mouth 3 (three) times daily with meals.    Marland Kitchen spironolactone (ALDACTONE) 25 MG tablet Take 25 mg by mouth daily.     No current facility-administered medications for this visit.     REVIEW OF SYSTEMS:  [X]  denotes positive finding, [ ]  denotes  negative finding Cardiac  Comments:  Chest pain or chest pressure:    Shortness of breath upon exertion:    Short of breath when lying flat:    Irregular heart rhythm:    Constitutional    Fever or chills:      PHYSICAL EXAM: Vitals:   04/11/16 1526  BP: (!) 147/80  Pulse: 93  Resp: 16  Temp: 99 F (37.2 C)  TempSrc: Oral  SpO2: 96%  Weight: 227 lb (103 kg)  Height: 6' (1.829 m)    GENERAL: The patient is a well-nourished male, in no acute distress. The vital signs are documented above. CARDIOVASCULAR: There is a regular rate and rhythm. PULMONARY: There is good air exchange bilaterally without wheezing or rales. He has a good thrill in the proximal fistula and then the fistulas more difficult to follow. The vein has not enlarged significantly since I saw him back in May. He appears to have some competing branches.  I reviewed his previous duplex scan from May and he does have some competing branches but no significant areas of stenosis.  MEDICAL ISSUES:  POORLY MATURING RIGHT RADIOCEPHALIC AV FISTULA: I think that his fistula is likely poorly maturing because of some competing branches. However, before scheduling ligation of his competing branches I think it would  be worth getting a fistulogram to rule out any other problems that might need to be addressed at the same time. He dialyzes on Monday Wednesdays and Fridays at 6 and still have to schedule this on a Tuesday or Thursday. Based on these results he can be scheduled for ligation of his competing branches and possible revision of his fistula by me on a nondialysis day. I have discussed the procedure potential complications with the patient and he is agreeable to proceed.  Deitra Mayo [facility] [phone]

## 2016-04-19 NOTE — Telephone Encounter (Signed)
-----   Message from Mena Goes, RN sent at 04/19/2016  1:51 PM EDT ----- Regarding: schedule   ----- Message ----- From: Conrad Langston, MD Sent: 04/19/2016  12:20 PM To: [facility] Charge Pool  Jon [name] [id number] 1955-10-21  PROCEDURE: right radiocephalic arteriovenous fistula cannulation under ultrasound guidance right arm fistulogram  Follow-up: Dr. Annamarie Major) for follow-up: 2-4 weeks

## 2016-04-19 NOTE — Discharge Instructions (Signed)
Fistulogram, Care After °Refer to this sheet in the next few weeks. These instructions provide you with information on caring for yourself after your procedure. Your health care provider may also give you more specific instructions. Your treatment has been planned according to current medical practices, but problems sometimes occur. Call your health care provider if you have any problems or questions after your procedure. °WHAT TO EXPECT AFTER THE PROCEDURE °After your procedure, it is typical to have the following: °· A small amount of discomfort in the area where the catheters were placed. °· A small amount of bruising around the fistula. °· Sleepiness and fatigue. °HOME CARE INSTRUCTIONS °· Rest at home for the day following your procedure. °· Do not drive or operate heavy machinery while taking pain medicine. °· Take medicines only as directed by your health care provider. °· Do not take baths, swim, or use a hot tub until your health care provider approves. You may shower 24 hours after the procedure or as directed by your health care provider. °· There are many different ways to close and cover an incision, including stitches, skin glue, and adhesive strips. Follow your health care provider's instructions on: °¨ Incision care. °¨ Bandage (dressing) changes and removal. °¨ Incision closure removal. °· Monitor your dialysis fistula carefully. °SEEK MEDICAL CARE IF: °· You have drainage, redness, swelling, or pain at your catheter site. °· You have a fever. °· You have chills. °SEEK IMMEDIATE MEDICAL CARE IF: °· You feel weak. °· You have trouble balancing. °· You have trouble moving your arms or legs. °· You have problems with your speech or vision. °· You can no longer feel a vibration or buzz when you put your fingers over your dialysis fistula. °· The limb that was used for the procedure: °¨ Swells. °¨ Is painful. °¨ Is cold. °¨ Is discolored, such as blue or pale white. °  °This information is not intended  to replace advice given to you by your health care provider. Make sure you discuss any questions you have with your health care provider. °  °Document Released: 11/30/2013 Document Reviewed: 11/30/2013 °Elsevier Interactive Patient Education ©2016 [employer] ° °

## 2016-04-19 NOTE — Interval H&P Note (Signed)
[facility]  History and Physical Update  The patient was interviewed and re-examined.  The patient's previous History and Physical has been reviewed and is unchanged from Dr. Nicole Cella consult.  There is no change in the plan of care: R arm fistulogram.   I discussed with the patient the nature of angiographic procedures, especially the limited patencies of any endovascular intervention.    The patient is aware of that the risks of an angiographic procedure include but are not limited to: bleeding, infection, access site complications, renal failure, embolization, rupture of vessel, dissection, arteriovenous fistula, possible need for emergent surgical intervention, possible need for surgical procedures to treat the patient's pathology, anaphylactic reaction to contrast, and stroke and death.    The patient is aware of the risks and agrees to proceed.   Adele Barthel, MD [facility] [place] Office: [phone] Pager: [phone]  04/19/2016, 7:28 AM

## 2016-04-19 NOTE — Telephone Encounter (Signed)
Spoke to pt, requested letter to be mailed for appt he was driving at the time.

## 2016-04-19 NOTE — Op Note (Signed)
    OPERATIVE NOTE   PROCEDURE: 1. right radiocephalic arteriovenous fistula cannulation under ultrasound guidance 2. right arm fistulogram  PRE-OPERATIVE DIAGNOSIS: Non-maturing left radiocephalic arteriovenous fistula  POST-OPERATIVE DIAGNOSIS: same as above   SURGEON: Adele Barthel, MD  ANESTHESIA: local  ESTIMATED BLOOD LOSS: 5 cc  FINDING(S): 1. Patent right radiocephalic: 6-4.8 mm in mid-segment, some tapering in distal segment likely related to vasospasm from cannulation 2. Bifurcation in cephalic vein in mid-arm with webbed network of veins in antecubitum 3. Mid-segment of cephalic vein has nearly total occlusion 4. Patent basilic and brachial vein system 5. Patent central venous system  SPECIMEN(S):  None  CONTRAST: 45 cc  INDICATIONS: Jon [name] is a 60 y.o. male who  presents with non-maturing right radiocephalic arteriovenous fistula.  The patient is scheduled for right arm fistulogram.  The patient is aware the risks include but are not limited to: bleeding, infection, thrombosis of the cannulated access, and possible anaphylactic reaction to the contrast.  The patient is aware of the risks of the procedure and elects to proceed forward.  DESCRIPTION: After full informed written consent was obtained, the patient was brought back to the angiography suite and placed supine upon the angiography table.  The patient was connected to monitoring equipment.  The right forearm was prepped and draped in the standard fashion for a right arm fistulogram.  Under ultrasound guidance, the right radiocephalic arteriovenous fistula was cannulated with a micropuncture needle.  The microwire was advanced into the fistula and the needle was exchanged for the a microsheath, which was lodged 2 cm into the access.  The wire was removed and the sheath was connected to the IV extension tubing.  Hand injections were completed to image the access from the antecubitum up to the level of axilla.  The  central venous structures were also imaged by hand injections.  Based on the images, this patient will need: consideration for side branch ligation vs. Ligation and transposition procedure.    A 4-0 Monocryl purse-string suture was sewn around the sheath.  The sheath was removed while tying down the suture.  A sterile bandage was applied to the puncture site.   COMPLICATIONS: none  CONDITION: stable  Adele Barthel, MD [facility] Office: [phone] Pager: [phone]  04/19/2016 12:15 PM

## 2016-04-20 DIAGNOSIS — N2581 Secondary hyperparathyroidism of renal origin: Secondary | ICD-10-CM | POA: Diagnosis not present

## 2016-04-20 DIAGNOSIS — E119 Type 2 diabetes mellitus without complications: Secondary | ICD-10-CM | POA: Diagnosis not present

## 2016-04-20 DIAGNOSIS — Z23 Encounter for immunization: Secondary | ICD-10-CM | POA: Diagnosis not present

## 2016-04-20 DIAGNOSIS — D509 Iron deficiency anemia, unspecified: Secondary | ICD-10-CM | POA: Diagnosis not present

## 2016-04-20 DIAGNOSIS — N186 End stage renal disease: Secondary | ICD-10-CM | POA: Diagnosis not present

## 2016-04-20 DIAGNOSIS — D631 Anemia in chronic kidney disease: Secondary | ICD-10-CM | POA: Diagnosis not present

## 2016-04-23 DIAGNOSIS — D509 Iron deficiency anemia, unspecified: Secondary | ICD-10-CM | POA: Diagnosis not present

## 2016-04-23 DIAGNOSIS — N186 End stage renal disease: Secondary | ICD-10-CM | POA: Diagnosis not present

## 2016-04-23 DIAGNOSIS — E119 Type 2 diabetes mellitus without complications: Secondary | ICD-10-CM | POA: Diagnosis not present

## 2016-04-23 DIAGNOSIS — D631 Anemia in chronic kidney disease: Secondary | ICD-10-CM | POA: Diagnosis not present

## 2016-04-23 DIAGNOSIS — Z23 Encounter for immunization: Secondary | ICD-10-CM | POA: Diagnosis not present

## 2016-04-23 DIAGNOSIS — N2581 Secondary hyperparathyroidism of renal origin: Secondary | ICD-10-CM | POA: Diagnosis not present

## 2016-04-25 DIAGNOSIS — D509 Iron deficiency anemia, unspecified: Secondary | ICD-10-CM | POA: Diagnosis not present

## 2016-04-25 DIAGNOSIS — N2581 Secondary hyperparathyroidism of renal origin: Secondary | ICD-10-CM | POA: Diagnosis not present

## 2016-04-25 DIAGNOSIS — E119 Type 2 diabetes mellitus without complications: Secondary | ICD-10-CM | POA: Diagnosis not present

## 2016-04-25 DIAGNOSIS — N186 End stage renal disease: Secondary | ICD-10-CM | POA: Diagnosis not present

## 2016-04-25 DIAGNOSIS — D631 Anemia in chronic kidney disease: Secondary | ICD-10-CM | POA: Diagnosis not present

## 2016-04-25 DIAGNOSIS — Z23 Encounter for immunization: Secondary | ICD-10-CM | POA: Diagnosis not present

## 2016-04-27 DIAGNOSIS — N186 End stage renal disease: Secondary | ICD-10-CM | POA: Diagnosis not present

## 2016-04-27 DIAGNOSIS — D631 Anemia in chronic kidney disease: Secondary | ICD-10-CM | POA: Diagnosis not present

## 2016-04-27 DIAGNOSIS — Z23 Encounter for immunization: Secondary | ICD-10-CM | POA: Diagnosis not present

## 2016-04-27 DIAGNOSIS — N2581 Secondary hyperparathyroidism of renal origin: Secondary | ICD-10-CM | POA: Diagnosis not present

## 2016-04-27 DIAGNOSIS — D509 Iron deficiency anemia, unspecified: Secondary | ICD-10-CM | POA: Diagnosis not present

## 2016-04-27 DIAGNOSIS — E119 Type 2 diabetes mellitus without complications: Secondary | ICD-10-CM | POA: Diagnosis not present

## 2016-04-28 DIAGNOSIS — N186 End stage renal disease: Secondary | ICD-10-CM | POA: Diagnosis not present

## 2016-04-28 DIAGNOSIS — Z992 Dependence on renal dialysis: Secondary | ICD-10-CM | POA: Diagnosis not present

## 2016-04-28 DIAGNOSIS — I120 Hypertensive chronic kidney disease with stage 5 chronic kidney disease or end stage renal disease: Secondary | ICD-10-CM | POA: Diagnosis not present

## 2016-04-30 DIAGNOSIS — E119 Type 2 diabetes mellitus without complications: Secondary | ICD-10-CM | POA: Diagnosis not present

## 2016-04-30 DIAGNOSIS — D509 Iron deficiency anemia, unspecified: Secondary | ICD-10-CM | POA: Diagnosis not present

## 2016-04-30 DIAGNOSIS — N186 End stage renal disease: Secondary | ICD-10-CM | POA: Diagnosis not present

## 2016-04-30 DIAGNOSIS — N2581 Secondary hyperparathyroidism of renal origin: Secondary | ICD-10-CM | POA: Diagnosis not present

## 2016-05-02 DIAGNOSIS — D509 Iron deficiency anemia, unspecified: Secondary | ICD-10-CM | POA: Diagnosis not present

## 2016-05-02 DIAGNOSIS — E119 Type 2 diabetes mellitus without complications: Secondary | ICD-10-CM | POA: Diagnosis not present

## 2016-05-02 DIAGNOSIS — N186 End stage renal disease: Secondary | ICD-10-CM | POA: Diagnosis not present

## 2016-05-02 DIAGNOSIS — N2581 Secondary hyperparathyroidism of renal origin: Secondary | ICD-10-CM | POA: Diagnosis not present

## 2016-05-04 ENCOUNTER — Encounter: Payer: Self-pay | Admitting: Vascular Surgery

## 2016-05-04 DIAGNOSIS — N186 End stage renal disease: Secondary | ICD-10-CM | POA: Diagnosis not present

## 2016-05-04 DIAGNOSIS — D509 Iron deficiency anemia, unspecified: Secondary | ICD-10-CM | POA: Diagnosis not present

## 2016-05-04 DIAGNOSIS — N2581 Secondary hyperparathyroidism of renal origin: Secondary | ICD-10-CM | POA: Diagnosis not present

## 2016-05-04 DIAGNOSIS — E119 Type 2 diabetes mellitus without complications: Secondary | ICD-10-CM | POA: Diagnosis not present

## 2016-05-07 DIAGNOSIS — N186 End stage renal disease: Secondary | ICD-10-CM | POA: Diagnosis not present

## 2016-05-07 DIAGNOSIS — D509 Iron deficiency anemia, unspecified: Secondary | ICD-10-CM | POA: Diagnosis not present

## 2016-05-07 DIAGNOSIS — E119 Type 2 diabetes mellitus without complications: Secondary | ICD-10-CM | POA: Diagnosis not present

## 2016-05-07 DIAGNOSIS — N2581 Secondary hyperparathyroidism of renal origin: Secondary | ICD-10-CM | POA: Diagnosis not present

## 2016-05-09 ENCOUNTER — Encounter: Payer: Self-pay | Admitting: Vascular Surgery

## 2016-05-09 ENCOUNTER — Ambulatory Visit (INDEPENDENT_AMBULATORY_CARE_PROVIDER_SITE_OTHER): Payer: Medicare Other | Admitting: Vascular Surgery

## 2016-05-09 VITALS — BP 173/113 | HR 91 | Temp 98.7°F | Resp 16 | Ht 72.0 in | Wt 228.0 lb

## 2016-05-09 DIAGNOSIS — D509 Iron deficiency anemia, unspecified: Secondary | ICD-10-CM | POA: Diagnosis not present

## 2016-05-09 DIAGNOSIS — N2581 Secondary hyperparathyroidism of renal origin: Secondary | ICD-10-CM | POA: Diagnosis not present

## 2016-05-09 DIAGNOSIS — Z992 Dependence on renal dialysis: Secondary | ICD-10-CM

## 2016-05-09 DIAGNOSIS — N186 End stage renal disease: Secondary | ICD-10-CM

## 2016-05-09 DIAGNOSIS — E119 Type 2 diabetes mellitus without complications: Secondary | ICD-10-CM | POA: Diagnosis not present

## 2016-05-09 NOTE — Progress Notes (Signed)
Patient name: Jon [name] MRN: [id number] DOB: 1955-09-27 Sex: male  REASON FOR VISIT: Follow up after fistulogram.  HPI: Jon [name] is a 60 y.o. male who I saw in the office on 04/11/2016. He had a right radiocephalic fistula placed on 10/25/2015. He also had a left arm AV fistula. This was degenerative which was why new access was placed in the right arm. The fistula on the right was not maturing adequately and therefore he was set up for a fistulogram.  He underwent a fistulogram by Dr. Adele Barthel on 04/19/2016. He comes in today to discuss those results.  His left upper arm fistula has been working well.  Current Outpatient Prescriptions  Medication Sig Dispense Refill  . amLODipine (NORVASC) 10 MG tablet Take 10 mg by mouth at bedtime.    Marland Kitchen aspirin EC 81 MG EC tablet Take 1 tablet (81 mg total) by mouth daily. 30 tablet 3  . atorvastatin (LIPITOR) 20 MG tablet Take 1 tablet (20 mg total) by mouth daily at 6 PM. 30 tablet 3  . carvedilol (COREG) 25 MG tablet Take 25 mg by mouth 2 (two) times daily.    Marland Kitchen labetalol (NORMODYNE) 200 MG tablet Take 1 tablet (200 mg total) by mouth 2 (two) times daily. 60 tablet 0  . losartan (COZAAR) 100 MG tablet Take 1 tablet (100 mg total) by mouth at bedtime. 30 tablet 0  . multivitamin (RENA-VIT) TABS tablet Take 1 tablet by mouth at bedtime.    . nitroGLYCERIN (NITROSTAT) 0.4 MG SL tablet Place 1 tablet (0.4 mg total) under the tongue every 5 (five) minutes x 3 doses as needed for chest pain. 25 tablet 12  . PHOSLYRA 667 MG/5ML SOLN Take 15 mLs by mouth 3 (three) times daily with meals.    Marland Kitchen spironolactone (ALDACTONE) 25 MG tablet Take 25 mg by mouth daily.     No current facility-administered medications for this visit.     REVIEW OF SYSTEMS:  [X]  denotes positive finding, [ ]  denotes negative finding Cardiac  Comments:  Chest pain or chest pressure:    Shortness of breath upon exertion:    Short of breath when lying flat:    Irregular heart  rhythm:    Constitutional    Fever or chills:      PHYSICAL EXAM: Vitals:   05/09/16 1236  BP: (!) 173/113  Pulse: 91  Resp: 16  Temp: 98.7 F (37.1 C)  TempSrc: Oral  SpO2: 96%  Weight: 228 lb (103.4 kg)  Height: 6' (1.829 m)    GENERAL: The patient is a well-nourished male, in no acute distress. The vital signs are documented above. CARDIOVASCULAR: There is a regular rate and rhythm. PULMONARY: There is good air exchange bilaterally without wheezing or rales. His left upper arm fistula is aneurysmal but has an excellent thrill. His right radiocephalic fistula also has a good thrill although it has not enlarged adequately yet.  FISTULOGRAM: I have reviewed his fistulogram. There are multiple issues with the fistula. The proximal vein appears to have some mild diffuse narrowing but no focal stenosis. The arterial anastomosis appears widely patent. There are 2 competing branches in the upper forearm. The upper arm cephalic vein looks reasonable initially but then become smaller and has a area of stenosis in the upper third of the upper arm.  MEDICAL ISSUES:  END-STAGE RENAL DISEASE:  I explained that I thought there were 2 options. The fistula in the right forearm does appear to be improving  gradually. One option would be to continue to use access in the left arm and give the fistula on the right more time. The other option would be to reconnect the fistula in the right forearm higher up on the radial artery above the area of mild narrowing and also ligate the 2 competing branches. He is agreeable to give the fistula some more time before considering revision. I scheduled a duplex scan in 6 weeks and I'll see him back at that time. He knows to call sooner if he has problems.  Deitra Mayo [facility] [phone]

## 2016-05-11 DIAGNOSIS — N2581 Secondary hyperparathyroidism of renal origin: Secondary | ICD-10-CM | POA: Diagnosis not present

## 2016-05-11 DIAGNOSIS — D509 Iron deficiency anemia, unspecified: Secondary | ICD-10-CM | POA: Diagnosis not present

## 2016-05-11 DIAGNOSIS — E119 Type 2 diabetes mellitus without complications: Secondary | ICD-10-CM | POA: Diagnosis not present

## 2016-05-11 DIAGNOSIS — N186 End stage renal disease: Secondary | ICD-10-CM | POA: Diagnosis not present

## 2016-05-11 NOTE — Addendum Note (Signed)
Addended by: Kaleen Mask on: 05/11/2016 03:17 PM   Modules accepted: Orders

## 2016-05-14 DIAGNOSIS — E119 Type 2 diabetes mellitus without complications: Secondary | ICD-10-CM | POA: Diagnosis not present

## 2016-05-14 DIAGNOSIS — N2581 Secondary hyperparathyroidism of renal origin: Secondary | ICD-10-CM | POA: Diagnosis not present

## 2016-05-14 DIAGNOSIS — N186 End stage renal disease: Secondary | ICD-10-CM | POA: Diagnosis not present

## 2016-05-14 DIAGNOSIS — D509 Iron deficiency anemia, unspecified: Secondary | ICD-10-CM | POA: Diagnosis not present

## 2016-05-15 DIAGNOSIS — N281 Cyst of kidney, acquired: Secondary | ICD-10-CM | POA: Diagnosis not present

## 2016-05-15 DIAGNOSIS — N2889 Other specified disorders of kidney and ureter: Secondary | ICD-10-CM | POA: Diagnosis not present

## 2016-05-16 DIAGNOSIS — D509 Iron deficiency anemia, unspecified: Secondary | ICD-10-CM | POA: Diagnosis not present

## 2016-05-16 DIAGNOSIS — N186 End stage renal disease: Secondary | ICD-10-CM | POA: Diagnosis not present

## 2016-05-16 DIAGNOSIS — E119 Type 2 diabetes mellitus without complications: Secondary | ICD-10-CM | POA: Diagnosis not present

## 2016-05-16 DIAGNOSIS — N2581 Secondary hyperparathyroidism of renal origin: Secondary | ICD-10-CM | POA: Diagnosis not present

## 2016-05-17 DIAGNOSIS — N186 End stage renal disease: Secondary | ICD-10-CM | POA: Diagnosis not present

## 2016-05-17 DIAGNOSIS — N2581 Secondary hyperparathyroidism of renal origin: Secondary | ICD-10-CM | POA: Diagnosis not present

## 2016-05-17 DIAGNOSIS — E119 Type 2 diabetes mellitus without complications: Secondary | ICD-10-CM | POA: Diagnosis not present

## 2016-05-17 DIAGNOSIS — D509 Iron deficiency anemia, unspecified: Secondary | ICD-10-CM | POA: Diagnosis not present

## 2016-05-18 DIAGNOSIS — Z7682 Awaiting organ transplant status: Secondary | ICD-10-CM | POA: Diagnosis not present

## 2016-05-18 DIAGNOSIS — N186 End stage renal disease: Secondary | ICD-10-CM | POA: Diagnosis not present

## 2016-05-18 DIAGNOSIS — N2889 Other specified disorders of kidney and ureter: Secondary | ICD-10-CM | POA: Diagnosis not present

## 2016-05-21 DIAGNOSIS — D509 Iron deficiency anemia, unspecified: Secondary | ICD-10-CM | POA: Diagnosis not present

## 2016-05-21 DIAGNOSIS — E119 Type 2 diabetes mellitus without complications: Secondary | ICD-10-CM | POA: Diagnosis not present

## 2016-05-21 DIAGNOSIS — N2581 Secondary hyperparathyroidism of renal origin: Secondary | ICD-10-CM | POA: Diagnosis not present

## 2016-05-21 DIAGNOSIS — N186 End stage renal disease: Secondary | ICD-10-CM | POA: Diagnosis not present

## 2016-05-23 DIAGNOSIS — D509 Iron deficiency anemia, unspecified: Secondary | ICD-10-CM | POA: Diagnosis not present

## 2016-05-23 DIAGNOSIS — E119 Type 2 diabetes mellitus without complications: Secondary | ICD-10-CM | POA: Diagnosis not present

## 2016-05-23 DIAGNOSIS — N2581 Secondary hyperparathyroidism of renal origin: Secondary | ICD-10-CM | POA: Diagnosis not present

## 2016-05-23 DIAGNOSIS — E1151 Type 2 diabetes mellitus with diabetic peripheral angiopathy without gangrene: Secondary | ICD-10-CM | POA: Diagnosis not present

## 2016-05-23 DIAGNOSIS — N186 End stage renal disease: Secondary | ICD-10-CM | POA: Diagnosis not present

## 2016-05-25 DIAGNOSIS — D509 Iron deficiency anemia, unspecified: Secondary | ICD-10-CM | POA: Diagnosis not present

## 2016-05-25 DIAGNOSIS — N186 End stage renal disease: Secondary | ICD-10-CM | POA: Diagnosis not present

## 2016-05-25 DIAGNOSIS — N2581 Secondary hyperparathyroidism of renal origin: Secondary | ICD-10-CM | POA: Diagnosis not present

## 2016-05-25 DIAGNOSIS — E119 Type 2 diabetes mellitus without complications: Secondary | ICD-10-CM | POA: Diagnosis not present

## 2016-05-28 DIAGNOSIS — N2581 Secondary hyperparathyroidism of renal origin: Secondary | ICD-10-CM | POA: Diagnosis not present

## 2016-05-28 DIAGNOSIS — N186 End stage renal disease: Secondary | ICD-10-CM | POA: Diagnosis not present

## 2016-05-28 DIAGNOSIS — E119 Type 2 diabetes mellitus without complications: Secondary | ICD-10-CM | POA: Diagnosis not present

## 2016-05-28 DIAGNOSIS — D509 Iron deficiency anemia, unspecified: Secondary | ICD-10-CM | POA: Diagnosis not present

## 2016-05-29 DIAGNOSIS — Z992 Dependence on renal dialysis: Secondary | ICD-10-CM | POA: Diagnosis not present

## 2016-05-29 DIAGNOSIS — N186 End stage renal disease: Secondary | ICD-10-CM | POA: Diagnosis not present

## 2016-05-29 DIAGNOSIS — I120 Hypertensive chronic kidney disease with stage 5 chronic kidney disease or end stage renal disease: Secondary | ICD-10-CM | POA: Diagnosis not present

## 2016-05-30 DIAGNOSIS — N186 End stage renal disease: Secondary | ICD-10-CM | POA: Diagnosis not present

## 2016-05-30 DIAGNOSIS — D509 Iron deficiency anemia, unspecified: Secondary | ICD-10-CM | POA: Diagnosis not present

## 2016-05-30 DIAGNOSIS — D631 Anemia in chronic kidney disease: Secondary | ICD-10-CM | POA: Diagnosis not present

## 2016-05-30 DIAGNOSIS — N2581 Secondary hyperparathyroidism of renal origin: Secondary | ICD-10-CM | POA: Diagnosis not present

## 2016-05-30 DIAGNOSIS — Z23 Encounter for immunization: Secondary | ICD-10-CM | POA: Diagnosis not present

## 2016-06-01 ENCOUNTER — Emergency Department (HOSPITAL_COMMUNITY): Payer: Medicare Other

## 2016-06-01 ENCOUNTER — Emergency Department (HOSPITAL_COMMUNITY)
Admission: EM | Admit: 2016-06-01 | Discharge: 2016-06-01 | Disposition: A | Discharge status: Home / Self Care | Payer: Medicare Other | Attending: Emergency Medicine | Admitting: Emergency Medicine

## 2016-06-01 ENCOUNTER — Encounter (HOSPITAL_COMMUNITY): Payer: Self-pay | Admitting: Emergency Medicine

## 2016-06-01 DIAGNOSIS — Z23 Encounter for immunization: Secondary | ICD-10-CM | POA: Diagnosis not present

## 2016-06-01 DIAGNOSIS — I120 Hypertensive chronic kidney disease with stage 5 chronic kidney disease or end stage renal disease: Secondary | ICD-10-CM | POA: Insufficient documentation

## 2016-06-01 DIAGNOSIS — Z992 Dependence on renal dialysis: Secondary | ICD-10-CM | POA: Diagnosis not present

## 2016-06-01 DIAGNOSIS — N186 End stage renal disease: Secondary | ICD-10-CM | POA: Insufficient documentation

## 2016-06-01 DIAGNOSIS — Z7982 Long term (current) use of aspirin: Secondary | ICD-10-CM | POA: Diagnosis not present

## 2016-06-01 DIAGNOSIS — R079 Chest pain, unspecified: Secondary | ICD-10-CM | POA: Diagnosis not present

## 2016-06-01 DIAGNOSIS — I252 Old myocardial infarction: Secondary | ICD-10-CM | POA: Diagnosis not present

## 2016-06-01 DIAGNOSIS — R0789 Other chest pain: Secondary | ICD-10-CM | POA: Diagnosis not present

## 2016-06-01 DIAGNOSIS — D509 Iron deficiency anemia, unspecified: Secondary | ICD-10-CM | POA: Diagnosis not present

## 2016-06-01 DIAGNOSIS — D631 Anemia in chronic kidney disease: Secondary | ICD-10-CM | POA: Diagnosis not present

## 2016-06-01 DIAGNOSIS — N2581 Secondary hyperparathyroidism of renal origin: Secondary | ICD-10-CM | POA: Diagnosis not present

## 2016-06-01 LAB — CBC WITH DIFFERENTIAL/PLATELET
BASOS PCT: 1 %
Basophils Absolute: 0 10*3/uL (ref 0.0–0.1)
Eosinophils Absolute: 0.2 10*3/uL (ref 0.0–0.7)
Eosinophils Relative: 2 %
HEMATOCRIT: 38.6 % — AB (ref 39.0–52.0)
Hemoglobin: 12.7 g/dL — ABNORMAL LOW (ref 13.0–17.0)
LYMPHS ABS: 2.1 10*3/uL (ref 0.7–4.0)
Lymphocytes Relative: 31 %
MCH: 31.4 pg (ref 26.0–34.0)
MCHC: 32.9 g/dL (ref 30.0–36.0)
MCV: 95.5 fL (ref 78.0–100.0)
MONO ABS: 0.5 10*3/uL (ref 0.1–1.0)
MONOS PCT: 7 %
NEUTROS ABS: 4 10*3/uL (ref 1.7–7.7)
Neutrophils Relative %: 59 %
Platelets: 136 10*3/uL — ABNORMAL LOW (ref 150–400)
RBC: 4.04 MIL/uL — ABNORMAL LOW (ref 4.22–5.81)
RDW: 14.8 % (ref 11.5–15.5)
WBC: 6.7 10*3/uL (ref 4.0–10.5)

## 2016-06-01 LAB — BASIC METABOLIC PANEL
ANION GAP: 10 (ref 5–15)
BUN: 17 mg/dL (ref 6–20)
CALCIUM: 8.5 mg/dL — AB (ref 8.9–10.3)
CO2: 33 mmol/L — AB (ref 22–32)
Chloride: 95 mmol/L — ABNORMAL LOW (ref 101–111)
Creatinine, Ser: 8.79 mg/dL — ABNORMAL HIGH (ref 0.61–1.24)
GFR calc Af Amer: 7 mL/min — ABNORMAL LOW (ref >60)
GFR calc non Af Amer: 6 mL/min — ABNORMAL LOW (ref >60)
GLUCOSE: 118 mg/dL — AB (ref 65–99)
Potassium: 3.5 mmol/L (ref 3.5–5.1)
Sodium: 138 mmol/L (ref 135–145)

## 2016-06-01 LAB — I-STAT TROPONIN, ED
Troponin i, poc: 0.05 ng/mL (ref 0.00–0.08)
Troponin i, poc: 0.08 ng/mL (ref 0.00–0.08)

## 2016-06-01 MED ORDER — MORPHINE SULFATE (PF) 4 MG/ML IV SOLN
2.0000 mg | Freq: Once | INTRAVENOUS | Status: AC
  Administered 2016-06-01: 2 mg via INTRAVENOUS
  Filled 2016-06-01: qty 1

## 2016-06-01 NOTE — Progress Notes (Signed)
Left upper arm graft deaccessed, pressure applied for 20 min. No active bleeding noted. RN made aware.

## 2016-06-01 NOTE — ED Provider Notes (Signed)
[facility] DEPT Provider Note   CSN: [id number] Arrival date & time: 06/01/16  0945     History   Chief Complaint Chief Complaint  Patient presents with  . Chest Pain    HPI Jon [name] is a 61 y.o. male.  The history is provided by the patient. No language interpreter was used.  Chest Pain     Jon [name] is a 60 y.o. male who presents to the Emergency Department complaining of chest pain.  He presents from dialysis for evaluation of sudden onset chest pain. He was 3-1/2 hours into his 4 hour session when he developed central chest pain described as a burning sensation. He denies any associated diaphoresis, nausea, shortness of breath, abdominal pain, nausea, vomiting. He has had prior similar episodes previously. He received aspirin and nitroglycerin by EMS with no improvement in his symptoms. Past Medical History:  Diagnosis Date  . Chronic kidney disease   . Dialysis patient ([facility])   . GERD (gastroesophageal reflux disease)   . History of blood transfusion   . Hyperlipidemia    takes Atorvastatin daily  . Hypertension    takes Amlodipine,Losartan,and Labetalol daily  . Myocardial infarction    lived in [place] maybe 7 years ago    Patient Active Problem List   Diagnosis Date Noted  . Pulmonary edema 05/01/2015  . Acute respiratory failure with hypoxia ([facility]) 05/01/2015  . Hypertensive urgency 05/01/2015  . Chronic anemia 05/01/2015  . Acute pulmonary edema ([facility])   . Chest pain   . Pain in limb- Right neck 02/23/2013  . End stage renal disease ([facility]) 01/26/2013  . ESRD (end stage renal disease) on dialysis ([place]) 05/07/2012  . History of small bowel obstruction 05/07/2012  . History of diverticulitis of colon 05/07/2012  . HTN (hypertension) 05/07/2012    Past Surgical History:  Procedure Laterality Date  . ARTERIOVENOUS GRAFT PLACEMENT    . AV FISTULA PLACEMENT Right 10/25/2015   Procedure: ARTERIOVENOUS (AV) FISTULA CREATION RIGHT FOREARM;  Surgeon:  Angelia Mould, MD;  Location: [facility];  Service: Vascular;  Laterality: Right;  . CARDIAC CATHETERIZATION N/A 05/05/2015   Procedure: Left Heart Cath and Coronary Angiography;  Surgeon: Charolette Forward, MD;  Location: [facility];  Service: Cardiovascular;  Laterality: N/A;  . PERIPHERAL VASCULAR CATHETERIZATION N/A 08/18/2015   Procedure: Fistulagram;  Surgeon: Conrad Royalton, MD;  Location: [facility];  Service: Cardiovascular;  Laterality: N/A;  . PERIPHERAL VASCULAR CATHETERIZATION Right 04/19/2016   Procedure: Fistulagram;  Surgeon: Conrad [place], MD;  Location: [facility];  Service: Cardiovascular;  Laterality: Right;  . RESECTION OF ARTERIOVENOUS FISTULA ANEURYSM Left 02/05/2013   Procedure: REPAIR OF ANEURYSM OF LEFT ARM ARTERIOVENOUS FISTULA ;  Surgeon: Serafina Mitchell, MD;  Location: [facility];  Service: Vascular;  Laterality: Left;  . RESECTION OF ARTERIOVENOUS FISTULA ANEURYSM Left 03/08/2015   Procedure: REPAIR OF LEFT ARTERIOVENOUS FISTULA PSEUDOANEURYSM;  Surgeon: Angelia Mould, MD;  Location: [facility];  Service: Vascular;  Laterality: Left;  . REVISON OF ARTERIOVENOUS FISTULA Left 40/03/8118   Procedure: PLICATION OF A LARGE ANEURYSM LEFT UPPER ARM BRACHIO-CEPHALIC ARTERIOVENOUS FISTULA ;  Surgeon: Angelia Mould, MD;  Location: [facility];  Service: Vascular;  Laterality: Left;  . SMALL INTESTINE SURGERY    . SMALL INTESTINE SURGERY         Home Medications    Prior to Admission medications   Medication Sig Start Date End Date Taking? Authorizing Provider  amLODipine (  NORVASC) 10 MG tablet Take 10 mg by mouth at bedtime.   Yes Historical Provider, MD  aspirin EC 81 MG EC tablet Take 1 tablet (81 mg total) by mouth daily. 05/30/13  Yes Charolette Forward, MD  atorvastatin (LIPITOR) 20 MG tablet Take 1 tablet (20 mg total) by mouth daily at 6 PM. 05/30/13  Yes Charolette Forward, MD  carvedilol (COREG) 25 MG tablet Take 25 mg by mouth 2 (two) times daily.   Yes  Historical Provider, MD  labetalol (NORMODYNE) 200 MG tablet Take 1 tablet (200 mg total) by mouth 2 (two) times daily. 05/06/15  Yes Modena Jansky, MD  losartan (COZAAR) 100 MG tablet Take 1 tablet (100 mg total) by mouth at bedtime. 05/06/15  Yes Modena Jansky, MD  multivitamin (RENA-VIT) TABS tablet Take 1 tablet by mouth at bedtime.   Yes Historical Provider, MD  JOINOMVE 720 MG/5ML SOLN Take 15 mLs by mouth 3 (three) times daily with meals. 09/30/15  Yes Historical Provider, MD  spironolactone (ALDACTONE) 25 MG tablet Take 25 mg by mouth daily.   Yes Historical Provider, MD  nitroGLYCERIN (NITROSTAT) 0.4 MG SL tablet Place 1 tablet (0.4 mg total) under the tongue every 5 (five) minutes x 3 doses as needed for chest pain. 05/30/13   Charolette Forward, MD    Family History Family History  Problem Relation Age of Onset  . Hypertension Mother   . Other Mother     varicose veins  . Diabetes Father   . Hypertension Father   . Hypertension Sister   . Other Sister     varicose veins  . Other Brother     varicose veins    Social History Social History  Substance Use Topics  . Smoking status: Never Smoker  . Smokeless tobacco: Never Used  . Alcohol use No     Allergies   Review of patient's allergies indicates no known allergies.   Review of Systems Review of Systems  Cardiovascular: Positive for chest pain.  All other systems reviewed and are negative.    Physical Exam Updated Vital Signs BP 115/58 (BP Location: Right Leg)   Pulse 71   Temp 98.6 F (37 C) (Oral)   Resp 18   SpO2 99%   Physical Exam  Constitutional: He is oriented to person, place, and time. He appears well-developed and well-nourished.  HENT:  Head: Normocephalic and atraumatic.  Cardiovascular: Normal rate and regular rhythm.   No murmur heard. Pulmonary/Chest: Effort normal and breath sounds normal. No respiratory distress.  Abdominal: Soft. There is no tenderness. There is no rebound and no  guarding.  Musculoskeletal:  Fistula in the left upper extremity that is accessed for dialysis.  Neurological: He is alert and oriented to person, place, and time.  Skin: Skin is warm and dry.  Psychiatric: He has a normal mood and affect. His behavior is normal.  Nursing note and vitals reviewed.    ED Treatments / Results  Labs (all labs ordered are listed, but only abnormal results are displayed) Labs Reviewed  BASIC METABOLIC PANEL - Abnormal; Notable for the following:       Result Value   Chloride 95 (*)    CO2 33 (*)    Glucose, Bld 118 (*)    Creatinine, Ser 8.79 (*)    Calcium 8.5 (*)    GFR calc non Af Amer 6 (*)    GFR calc Af Amer 7 (*)    All other components within normal  limits  CBC WITH DIFFERENTIAL/PLATELET - Abnormal; Notable for the following:    RBC 4.04 (*)    Hemoglobin 12.7 (*)    HCT 38.6 (*)    Platelets 136 (*)    All other components within normal limits  I-STAT TROPOININ, ED  I-STAT TROPOININ, ED    EKG  EKG Interpretation  Date/Time:  Friday June 01 2016 10:03:24 EDT Ventricular Rate:  78 PR Interval:    QRS Duration: 104 QT Interval:  453 QTC Calculation: 516 R Axis:   -56 Text Interpretation:  Sinus rhythm Paired ventricular premature complexes Left anterior fascicular block Abnormal R-wave progression, late transition LVH with secondary repolarization abnormality Prolonged QT interval Confirmed by Hazle Coca [phone]) on 06/01/2016 10:06:03 AM Also confirmed by Hazle Coca [phone]), editor [address], [place] [phone])  on 06/01/2016 11:14:13 AM       Radiology Dg Chest Port 1 View  Result Date: 06/01/2016 CLINICAL DATA:  Chest pain. EXAM: PORTABLE CHEST 1 VIEW COMPARISON:  Chest CT 05/01/2015.  Chest x-ray 05/01/2015 . FINDINGS: Mediastinum and hilar structures normal. Cardiomegaly with normal pulmonary vascularity. No pleural effusion or pneumothorax. IMPRESSION: 1. Cardiomegaly.  No pulmonary venous congestion. 2. No acute pulmonary  infiltrate. Chest stable from prior studies of 05/01/2015. Electronically Signed   By: Marcello Moores  Register   On: 06/01/2016 10:14    Procedures Procedures (including critical care time)  Medications Ordered in ED Medications  morphine 4 MG/ML injection 2 mg (2 mg Intravenous Given 06/01/16 1039)     Initial Impression / Assessment and Plan / ED Course  I have reviewed the triage vital signs and the nursing notes.  Pertinent labs & imaging results that were available during my care of the patient were reviewed by me and considered in my medical decision making (see chart for details).  Clinical Course    Pt with hx/o ESRD on HD on chest pain, resolved after pain meds in the ED.  He has a hx/o cardiac cath 10/16 with 10% LAD lesion, no significant CAD.  Presentation is not c/w ACS, PE, dissection, pna.  Discussed home care, outpatient follow up, return precautions.    Final Clinical Impressions(s) / ED Diagnoses   Final diagnoses:  Chest pain, unspecified type    New Prescriptions Discharge Medication List as of 06/01/2016  2:03 PM       Quintella Reichert, MD 06/01/16 1745

## 2016-06-01 NOTE — ED Triage Notes (Addendum)
Per [facility] patient complains of chest pain that started at 845 this morning during hour 3 of a 4 hour dialysis treatment.  Patient has not missed any dialysis appointments.  Patient received 324 aspirin and 1 SL Nitro prior to arrival.  Patient states the nitro helped a little and then the pain returned.  Patient is alert and oriented and no apparent distress at this time.  Dialysis access in left upper arm is currently accessed, IV team called to deaccess.  Patient has maturing fistula in right arm.  IV in left hand from EMS saline locked.

## 2016-06-04 DIAGNOSIS — Z23 Encounter for immunization: Secondary | ICD-10-CM | POA: Diagnosis not present

## 2016-06-04 DIAGNOSIS — D509 Iron deficiency anemia, unspecified: Secondary | ICD-10-CM | POA: Diagnosis not present

## 2016-06-04 DIAGNOSIS — D631 Anemia in chronic kidney disease: Secondary | ICD-10-CM | POA: Diagnosis not present

## 2016-06-04 DIAGNOSIS — N2581 Secondary hyperparathyroidism of renal origin: Secondary | ICD-10-CM | POA: Diagnosis not present

## 2016-06-04 DIAGNOSIS — N186 End stage renal disease: Secondary | ICD-10-CM | POA: Diagnosis not present

## 2016-06-06 DIAGNOSIS — Z23 Encounter for immunization: Secondary | ICD-10-CM | POA: Diagnosis not present

## 2016-06-06 DIAGNOSIS — N2581 Secondary hyperparathyroidism of renal origin: Secondary | ICD-10-CM | POA: Diagnosis not present

## 2016-06-06 DIAGNOSIS — N186 End stage renal disease: Secondary | ICD-10-CM | POA: Diagnosis not present

## 2016-06-06 DIAGNOSIS — D631 Anemia in chronic kidney disease: Secondary | ICD-10-CM | POA: Diagnosis not present

## 2016-06-06 DIAGNOSIS — D509 Iron deficiency anemia, unspecified: Secondary | ICD-10-CM | POA: Diagnosis not present

## 2016-06-08 DIAGNOSIS — N2581 Secondary hyperparathyroidism of renal origin: Secondary | ICD-10-CM | POA: Diagnosis not present

## 2016-06-08 DIAGNOSIS — Z23 Encounter for immunization: Secondary | ICD-10-CM | POA: Diagnosis not present

## 2016-06-08 DIAGNOSIS — D631 Anemia in chronic kidney disease: Secondary | ICD-10-CM | POA: Diagnosis not present

## 2016-06-08 DIAGNOSIS — N186 End stage renal disease: Secondary | ICD-10-CM | POA: Diagnosis not present

## 2016-06-08 DIAGNOSIS — D509 Iron deficiency anemia, unspecified: Secondary | ICD-10-CM | POA: Diagnosis not present

## 2016-06-11 DIAGNOSIS — D509 Iron deficiency anemia, unspecified: Secondary | ICD-10-CM | POA: Diagnosis not present

## 2016-06-11 DIAGNOSIS — Z23 Encounter for immunization: Secondary | ICD-10-CM | POA: Diagnosis not present

## 2016-06-11 DIAGNOSIS — N2581 Secondary hyperparathyroidism of renal origin: Secondary | ICD-10-CM | POA: Diagnosis not present

## 2016-06-11 DIAGNOSIS — N186 End stage renal disease: Secondary | ICD-10-CM | POA: Diagnosis not present

## 2016-06-11 DIAGNOSIS — D631 Anemia in chronic kidney disease: Secondary | ICD-10-CM | POA: Diagnosis not present

## 2016-06-13 ENCOUNTER — Encounter: Payer: Self-pay | Admitting: Vascular Surgery

## 2016-06-13 DIAGNOSIS — D509 Iron deficiency anemia, unspecified: Secondary | ICD-10-CM | POA: Diagnosis not present

## 2016-06-13 DIAGNOSIS — N2581 Secondary hyperparathyroidism of renal origin: Secondary | ICD-10-CM | POA: Diagnosis not present

## 2016-06-13 DIAGNOSIS — Z23 Encounter for immunization: Secondary | ICD-10-CM | POA: Diagnosis not present

## 2016-06-13 DIAGNOSIS — D631 Anemia in chronic kidney disease: Secondary | ICD-10-CM | POA: Diagnosis not present

## 2016-06-13 DIAGNOSIS — N186 End stage renal disease: Secondary | ICD-10-CM | POA: Diagnosis not present

## 2016-06-15 DIAGNOSIS — N186 End stage renal disease: Secondary | ICD-10-CM | POA: Diagnosis not present

## 2016-06-15 DIAGNOSIS — N2581 Secondary hyperparathyroidism of renal origin: Secondary | ICD-10-CM | POA: Diagnosis not present

## 2016-06-15 DIAGNOSIS — D631 Anemia in chronic kidney disease: Secondary | ICD-10-CM | POA: Diagnosis not present

## 2016-06-15 DIAGNOSIS — D509 Iron deficiency anemia, unspecified: Secondary | ICD-10-CM | POA: Diagnosis not present

## 2016-06-15 DIAGNOSIS — Z23 Encounter for immunization: Secondary | ICD-10-CM | POA: Diagnosis not present

## 2016-06-17 DIAGNOSIS — D509 Iron deficiency anemia, unspecified: Secondary | ICD-10-CM | POA: Diagnosis not present

## 2016-06-17 DIAGNOSIS — D631 Anemia in chronic kidney disease: Secondary | ICD-10-CM | POA: Diagnosis not present

## 2016-06-17 DIAGNOSIS — Z23 Encounter for immunization: Secondary | ICD-10-CM | POA: Diagnosis not present

## 2016-06-17 DIAGNOSIS — N2581 Secondary hyperparathyroidism of renal origin: Secondary | ICD-10-CM | POA: Diagnosis not present

## 2016-06-17 DIAGNOSIS — N186 End stage renal disease: Secondary | ICD-10-CM | POA: Diagnosis not present

## 2016-06-19 DIAGNOSIS — Z992 Dependence on renal dialysis: Secondary | ICD-10-CM | POA: Diagnosis not present

## 2016-06-19 DIAGNOSIS — N2581 Secondary hyperparathyroidism of renal origin: Secondary | ICD-10-CM | POA: Diagnosis not present

## 2016-06-19 DIAGNOSIS — E785 Hyperlipidemia, unspecified: Secondary | ICD-10-CM | POA: Diagnosis not present

## 2016-06-19 DIAGNOSIS — D649 Anemia, unspecified: Secondary | ICD-10-CM | POA: Diagnosis not present

## 2016-06-19 DIAGNOSIS — R7309 Other abnormal glucose: Secondary | ICD-10-CM | POA: Diagnosis not present

## 2016-06-19 DIAGNOSIS — I2510 Atherosclerotic heart disease of native coronary artery without angina pectoris: Secondary | ICD-10-CM | POA: Diagnosis not present

## 2016-06-19 DIAGNOSIS — N186 End stage renal disease: Secondary | ICD-10-CM | POA: Diagnosis not present

## 2016-06-19 DIAGNOSIS — I120 Hypertensive chronic kidney disease with stage 5 chronic kidney disease or end stage renal disease: Secondary | ICD-10-CM | POA: Diagnosis not present

## 2016-06-19 DIAGNOSIS — Z23 Encounter for immunization: Secondary | ICD-10-CM | POA: Diagnosis not present

## 2016-06-19 DIAGNOSIS — D631 Anemia in chronic kidney disease: Secondary | ICD-10-CM | POA: Diagnosis not present

## 2016-06-19 DIAGNOSIS — D509 Iron deficiency anemia, unspecified: Secondary | ICD-10-CM | POA: Diagnosis not present

## 2016-06-19 DIAGNOSIS — E669 Obesity, unspecified: Secondary | ICD-10-CM | POA: Diagnosis not present

## 2016-06-20 ENCOUNTER — Ambulatory Visit (HOSPITAL_COMMUNITY)
Admission: RE | Admit: 2016-06-20 | Discharge: 2016-06-20 | Disposition: A | Discharge status: Home / Self Care | Payer: Medicare Other | Source: Ambulatory Visit | Attending: Vascular Surgery | Admitting: Vascular Surgery

## 2016-06-20 ENCOUNTER — Ambulatory Visit (INDEPENDENT_AMBULATORY_CARE_PROVIDER_SITE_OTHER): Payer: Medicare Other | Admitting: Vascular Surgery

## 2016-06-20 ENCOUNTER — Encounter: Payer: Self-pay | Admitting: Vascular Surgery

## 2016-06-20 VITALS — BP 173/89 | HR 78 | Temp 97.3°F | Resp 20 | Ht 72.0 in | Wt 231.6 lb

## 2016-06-20 DIAGNOSIS — N186 End stage renal disease: Secondary | ICD-10-CM | POA: Insufficient documentation

## 2016-06-20 DIAGNOSIS — Z992 Dependence on renal dialysis: Secondary | ICD-10-CM | POA: Insufficient documentation

## 2016-06-20 NOTE — Progress Notes (Signed)
Patient name: Jon [name] MRN: [id number] DOB: 10-31-1955 Sex: male  REASON FOR VISIT: Follow up  HPI: Jon [name] is a 60 y.o. male who I last saw on 05/09/2016. He had a right radiocephalic fistula placed on 10/25/2015. He also has a left arm fistula. The left arm fistula was degenerative which was Y Access was placed in the right arm. The new fistula on the right arm was not maturing adequately. He underwent a fistulogram by Dr. Adele Barthel on 04/19/2016.  The fistulogram showed multiple issues with the fistula. The proximal vein had mild diffuse narrowing but no focal stenosis. The arterial anastomosis appeared to be patent. There were 2 competing branches in the upper forearm. The upper arm cephalic vein was reasonable initially but then became smaller in the upper third of the arm.  He comes in for follow up visit. He tells me that he is on the transplant list and that his upper arm fistula on the left is working well.  Current Outpatient Prescriptions  Medication Sig Dispense Refill  . amLODipine (NORVASC) 10 MG tablet Take 10 mg by mouth at bedtime.    Marland Kitchen aspirin EC 81 MG EC tablet Take 1 tablet (81 mg total) by mouth daily. 30 tablet 3  . atorvastatin (LIPITOR) 20 MG tablet Take 1 tablet (20 mg total) by mouth daily at 6 PM. 30 tablet 3  . carvedilol (COREG) 25 MG tablet Take 25 mg by mouth 2 (two) times daily.    Marland Kitchen labetalol (NORMODYNE) 200 MG tablet Take 1 tablet (200 mg total) by mouth 2 (two) times daily. 60 tablet 0  . losartan (COZAAR) 100 MG tablet Take 1 tablet (100 mg total) by mouth at bedtime. 30 tablet 0  . multivitamin (RENA-VIT) TABS tablet Take 1 tablet by mouth at bedtime.    . nitroGLYCERIN (NITROSTAT) 0.4 MG SL tablet Place 1 tablet (0.4 mg total) under the tongue every 5 (five) minutes x 3 doses as needed for chest pain. 25 tablet 12  . PHOSLYRA 667 MG/5ML SOLN Take 15 mLs by mouth 3 (three) times daily with meals.    Marland Kitchen spironolactone (ALDACTONE) 25 MG tablet Take  25 mg by mouth daily.     No current facility-administered medications for this visit.     REVIEW OF SYSTEMS:  [X]  denotes positive finding, [ ]  denotes negative finding Cardiac  Comments:  Chest pain or chest pressure:    Shortness of breath upon exertion:    Short of breath when lying flat:    Irregular heart rhythm:    Constitutional    Fever or chills:      PHYSICAL EXAM: Vitals:   06/20/16 1331 06/20/16 1335  BP: (!) 209/126 (!) 173/89  Pulse: 78   Resp: 20   Temp: 97.3 F (36.3 C)   TempSrc: Oral   SpO2: 100%   Weight: 231 lb 9.6 oz (105.1 kg)   Height: 6' (1.829 m)     GENERAL: The patient is a well-nourished male, in no acute distress. The vital signs are documented above. CARDIOVASCULAR: There is a regular rate and rhythm. PULMONARY: There is good air exchange bilaterally without wheezing or rales. His left upper arm fistula has a good thrill. His right forearm fistula likewise has a reasonable thrill although somewhat weak.  DUPLEX RIGHT RADIOCEPHALIC AV FISTULA: I have independently interpreted his duplex of his right radiocephalic AV fistula. Diameters range from 0.24-0.40 cm. Thus this is not adequate in size. There are several areas of  elevated velocities in the fistula.  MEDICAL ISSUES:  POORLY MATURING RIGHT RADIOCEPHALIC AV FISTULA:  I have discussed several options with the patient. One option would be to reanastomose the cephalic vein into the radial artery higher up above the area of narrowing in the proximal fistula. At the same time we could ligate the competing branches. The second option would be an upper arm brachiocephalic fistula although this vein narrows down in the upper third of the arm and I'm concerned about this not functioning adequately also. Third option would be a basilic vein transposition.  He feels strongly that the upper arm fistula on the left is working well and that now that he is on the transplant list he would like to continue  using this until he gets a transplant. I do not think this is unreasonable. I will see him back as needed.  Deitra Mayo [facility] [phone]

## 2016-06-22 DIAGNOSIS — D509 Iron deficiency anemia, unspecified: Secondary | ICD-10-CM | POA: Diagnosis not present

## 2016-06-22 DIAGNOSIS — N2581 Secondary hyperparathyroidism of renal origin: Secondary | ICD-10-CM | POA: Diagnosis not present

## 2016-06-22 DIAGNOSIS — N186 End stage renal disease: Secondary | ICD-10-CM | POA: Diagnosis not present

## 2016-06-22 DIAGNOSIS — Z23 Encounter for immunization: Secondary | ICD-10-CM | POA: Diagnosis not present

## 2016-06-22 DIAGNOSIS — D631 Anemia in chronic kidney disease: Secondary | ICD-10-CM | POA: Diagnosis not present

## 2016-06-25 DIAGNOSIS — D631 Anemia in chronic kidney disease: Secondary | ICD-10-CM | POA: Diagnosis not present

## 2016-06-25 DIAGNOSIS — Z23 Encounter for immunization: Secondary | ICD-10-CM | POA: Diagnosis not present

## 2016-06-25 DIAGNOSIS — D509 Iron deficiency anemia, unspecified: Secondary | ICD-10-CM | POA: Diagnosis not present

## 2016-06-25 DIAGNOSIS — N2581 Secondary hyperparathyroidism of renal origin: Secondary | ICD-10-CM | POA: Diagnosis not present

## 2016-06-25 DIAGNOSIS — N186 End stage renal disease: Secondary | ICD-10-CM | POA: Diagnosis not present

## 2016-06-27 DIAGNOSIS — N2581 Secondary hyperparathyroidism of renal origin: Secondary | ICD-10-CM | POA: Diagnosis not present

## 2016-06-27 DIAGNOSIS — N186 End stage renal disease: Secondary | ICD-10-CM | POA: Diagnosis not present

## 2016-06-27 DIAGNOSIS — D509 Iron deficiency anemia, unspecified: Secondary | ICD-10-CM | POA: Diagnosis not present

## 2016-06-27 DIAGNOSIS — D631 Anemia in chronic kidney disease: Secondary | ICD-10-CM | POA: Diagnosis not present

## 2016-06-27 DIAGNOSIS — Z23 Encounter for immunization: Secondary | ICD-10-CM | POA: Diagnosis not present

## 2016-06-28 DIAGNOSIS — I120 Hypertensive chronic kidney disease with stage 5 chronic kidney disease or end stage renal disease: Secondary | ICD-10-CM | POA: Diagnosis not present

## 2016-06-28 DIAGNOSIS — N186 End stage renal disease: Secondary | ICD-10-CM | POA: Diagnosis not present

## 2016-06-28 DIAGNOSIS — Z992 Dependence on renal dialysis: Secondary | ICD-10-CM | POA: Diagnosis not present

## 2016-06-29 DIAGNOSIS — N186 End stage renal disease: Secondary | ICD-10-CM | POA: Diagnosis not present

## 2016-06-29 DIAGNOSIS — N2581 Secondary hyperparathyroidism of renal origin: Secondary | ICD-10-CM | POA: Diagnosis not present

## 2016-06-29 DIAGNOSIS — D509 Iron deficiency anemia, unspecified: Secondary | ICD-10-CM | POA: Diagnosis not present

## 2016-07-02 DIAGNOSIS — D509 Iron deficiency anemia, unspecified: Secondary | ICD-10-CM | POA: Diagnosis not present

## 2016-07-02 DIAGNOSIS — N186 End stage renal disease: Secondary | ICD-10-CM | POA: Diagnosis not present

## 2016-07-02 DIAGNOSIS — N2581 Secondary hyperparathyroidism of renal origin: Secondary | ICD-10-CM | POA: Diagnosis not present

## 2016-07-04 DIAGNOSIS — N186 End stage renal disease: Secondary | ICD-10-CM | POA: Diagnosis not present

## 2016-07-04 DIAGNOSIS — D509 Iron deficiency anemia, unspecified: Secondary | ICD-10-CM | POA: Diagnosis not present

## 2016-07-04 DIAGNOSIS — N2581 Secondary hyperparathyroidism of renal origin: Secondary | ICD-10-CM | POA: Diagnosis not present

## 2016-07-06 DIAGNOSIS — N186 End stage renal disease: Secondary | ICD-10-CM | POA: Diagnosis not present

## 2016-07-06 DIAGNOSIS — N2581 Secondary hyperparathyroidism of renal origin: Secondary | ICD-10-CM | POA: Diagnosis not present

## 2016-07-06 DIAGNOSIS — D509 Iron deficiency anemia, unspecified: Secondary | ICD-10-CM | POA: Diagnosis not present

## 2016-07-09 DIAGNOSIS — N2581 Secondary hyperparathyroidism of renal origin: Secondary | ICD-10-CM | POA: Diagnosis not present

## 2016-07-09 DIAGNOSIS — N186 End stage renal disease: Secondary | ICD-10-CM | POA: Diagnosis not present

## 2016-07-11 DIAGNOSIS — N2581 Secondary hyperparathyroidism of renal origin: Secondary | ICD-10-CM | POA: Diagnosis not present

## 2016-07-11 DIAGNOSIS — N186 End stage renal disease: Secondary | ICD-10-CM | POA: Diagnosis not present

## 2016-07-13 DIAGNOSIS — N2581 Secondary hyperparathyroidism of renal origin: Secondary | ICD-10-CM | POA: Diagnosis not present

## 2016-07-13 DIAGNOSIS — N186 End stage renal disease: Secondary | ICD-10-CM | POA: Diagnosis not present

## 2016-07-16 DIAGNOSIS — N2581 Secondary hyperparathyroidism of renal origin: Secondary | ICD-10-CM | POA: Diagnosis not present

## 2016-07-16 DIAGNOSIS — N186 End stage renal disease: Secondary | ICD-10-CM | POA: Diagnosis not present

## 2016-07-18 DIAGNOSIS — N186 End stage renal disease: Secondary | ICD-10-CM | POA: Diagnosis not present

## 2016-07-18 DIAGNOSIS — N2581 Secondary hyperparathyroidism of renal origin: Secondary | ICD-10-CM | POA: Diagnosis not present

## 2016-07-20 DIAGNOSIS — N186 End stage renal disease: Secondary | ICD-10-CM | POA: Diagnosis not present

## 2016-07-20 DIAGNOSIS — N2581 Secondary hyperparathyroidism of renal origin: Secondary | ICD-10-CM | POA: Diagnosis not present

## 2016-07-22 DIAGNOSIS — N186 End stage renal disease: Secondary | ICD-10-CM | POA: Diagnosis not present

## 2016-07-22 DIAGNOSIS — N2581 Secondary hyperparathyroidism of renal origin: Secondary | ICD-10-CM | POA: Diagnosis not present

## 2016-07-25 DIAGNOSIS — N2581 Secondary hyperparathyroidism of renal origin: Secondary | ICD-10-CM | POA: Diagnosis not present

## 2016-07-25 DIAGNOSIS — N186 End stage renal disease: Secondary | ICD-10-CM | POA: Diagnosis not present

## 2016-07-27 DIAGNOSIS — N2581 Secondary hyperparathyroidism of renal origin: Secondary | ICD-10-CM | POA: Diagnosis not present

## 2016-07-27 DIAGNOSIS — N186 End stage renal disease: Secondary | ICD-10-CM | POA: Diagnosis not present

## 2016-07-29 DIAGNOSIS — N186 End stage renal disease: Secondary | ICD-10-CM | POA: Diagnosis not present

## 2016-07-29 DIAGNOSIS — N2581 Secondary hyperparathyroidism of renal origin: Secondary | ICD-10-CM | POA: Diagnosis not present

## 2016-07-29 DIAGNOSIS — Z992 Dependence on renal dialysis: Secondary | ICD-10-CM | POA: Diagnosis not present

## 2016-07-29 DIAGNOSIS — I120 Hypertensive chronic kidney disease with stage 5 chronic kidney disease or end stage renal disease: Secondary | ICD-10-CM | POA: Diagnosis not present

## 2016-08-01 DIAGNOSIS — N186 End stage renal disease: Secondary | ICD-10-CM | POA: Diagnosis not present

## 2016-08-01 DIAGNOSIS — N2581 Secondary hyperparathyroidism of renal origin: Secondary | ICD-10-CM | POA: Diagnosis not present

## 2016-08-03 DIAGNOSIS — N186 End stage renal disease: Secondary | ICD-10-CM | POA: Diagnosis not present

## 2016-08-03 DIAGNOSIS — N2581 Secondary hyperparathyroidism of renal origin: Secondary | ICD-10-CM | POA: Diagnosis not present

## 2016-08-06 DIAGNOSIS — N2581 Secondary hyperparathyroidism of renal origin: Secondary | ICD-10-CM | POA: Diagnosis not present

## 2016-08-06 DIAGNOSIS — N186 End stage renal disease: Secondary | ICD-10-CM | POA: Diagnosis not present

## 2016-08-08 DIAGNOSIS — N186 End stage renal disease: Secondary | ICD-10-CM | POA: Diagnosis not present

## 2016-08-08 DIAGNOSIS — N2581 Secondary hyperparathyroidism of renal origin: Secondary | ICD-10-CM | POA: Diagnosis not present

## 2016-08-10 DIAGNOSIS — N186 End stage renal disease: Secondary | ICD-10-CM | POA: Diagnosis not present

## 2016-08-10 DIAGNOSIS — E119 Type 2 diabetes mellitus without complications: Secondary | ICD-10-CM | POA: Diagnosis not present

## 2016-08-10 DIAGNOSIS — N2581 Secondary hyperparathyroidism of renal origin: Secondary | ICD-10-CM | POA: Diagnosis not present

## 2016-08-10 DIAGNOSIS — D509 Iron deficiency anemia, unspecified: Secondary | ICD-10-CM | POA: Diagnosis not present

## 2016-08-13 DIAGNOSIS — N186 End stage renal disease: Secondary | ICD-10-CM | POA: Diagnosis not present

## 2016-08-13 DIAGNOSIS — D509 Iron deficiency anemia, unspecified: Secondary | ICD-10-CM | POA: Diagnosis not present

## 2016-08-13 DIAGNOSIS — E119 Type 2 diabetes mellitus without complications: Secondary | ICD-10-CM | POA: Diagnosis not present

## 2016-08-13 DIAGNOSIS — N2581 Secondary hyperparathyroidism of renal origin: Secondary | ICD-10-CM | POA: Diagnosis not present

## 2016-08-15 DIAGNOSIS — N2581 Secondary hyperparathyroidism of renal origin: Secondary | ICD-10-CM | POA: Diagnosis not present

## 2016-08-15 DIAGNOSIS — N186 End stage renal disease: Secondary | ICD-10-CM | POA: Diagnosis not present

## 2016-08-15 DIAGNOSIS — E119 Type 2 diabetes mellitus without complications: Secondary | ICD-10-CM | POA: Diagnosis not present

## 2016-08-15 DIAGNOSIS — D509 Iron deficiency anemia, unspecified: Secondary | ICD-10-CM | POA: Diagnosis not present

## 2016-08-17 DIAGNOSIS — D509 Iron deficiency anemia, unspecified: Secondary | ICD-10-CM | POA: Diagnosis not present

## 2016-08-17 DIAGNOSIS — N2581 Secondary hyperparathyroidism of renal origin: Secondary | ICD-10-CM | POA: Diagnosis not present

## 2016-08-17 DIAGNOSIS — N186 End stage renal disease: Secondary | ICD-10-CM | POA: Diagnosis not present

## 2016-08-17 DIAGNOSIS — E119 Type 2 diabetes mellitus without complications: Secondary | ICD-10-CM | POA: Diagnosis not present

## 2016-08-20 DIAGNOSIS — D509 Iron deficiency anemia, unspecified: Secondary | ICD-10-CM | POA: Diagnosis not present

## 2016-08-20 DIAGNOSIS — N2581 Secondary hyperparathyroidism of renal origin: Secondary | ICD-10-CM | POA: Diagnosis not present

## 2016-08-20 DIAGNOSIS — N186 End stage renal disease: Secondary | ICD-10-CM | POA: Diagnosis not present

## 2016-08-20 DIAGNOSIS — E119 Type 2 diabetes mellitus without complications: Secondary | ICD-10-CM | POA: Diagnosis not present

## 2016-08-22 DIAGNOSIS — D509 Iron deficiency anemia, unspecified: Secondary | ICD-10-CM | POA: Diagnosis not present

## 2016-08-22 DIAGNOSIS — E119 Type 2 diabetes mellitus without complications: Secondary | ICD-10-CM | POA: Diagnosis not present

## 2016-08-22 DIAGNOSIS — E1151 Type 2 diabetes mellitus with diabetic peripheral angiopathy without gangrene: Secondary | ICD-10-CM | POA: Diagnosis not present

## 2016-08-22 DIAGNOSIS — N186 End stage renal disease: Secondary | ICD-10-CM | POA: Diagnosis not present

## 2016-08-22 DIAGNOSIS — N2581 Secondary hyperparathyroidism of renal origin: Secondary | ICD-10-CM | POA: Diagnosis not present

## 2016-08-24 DIAGNOSIS — N186 End stage renal disease: Secondary | ICD-10-CM | POA: Diagnosis not present

## 2016-08-24 DIAGNOSIS — E119 Type 2 diabetes mellitus without complications: Secondary | ICD-10-CM | POA: Diagnosis not present

## 2016-08-24 DIAGNOSIS — N2581 Secondary hyperparathyroidism of renal origin: Secondary | ICD-10-CM | POA: Diagnosis not present

## 2016-08-24 DIAGNOSIS — D509 Iron deficiency anemia, unspecified: Secondary | ICD-10-CM | POA: Diagnosis not present

## 2016-08-27 DIAGNOSIS — E119 Type 2 diabetes mellitus without complications: Secondary | ICD-10-CM | POA: Diagnosis not present

## 2016-08-27 DIAGNOSIS — N2581 Secondary hyperparathyroidism of renal origin: Secondary | ICD-10-CM | POA: Diagnosis not present

## 2016-08-27 DIAGNOSIS — D509 Iron deficiency anemia, unspecified: Secondary | ICD-10-CM | POA: Diagnosis not present

## 2016-08-27 DIAGNOSIS — N186 End stage renal disease: Secondary | ICD-10-CM | POA: Diagnosis not present

## 2016-08-28 DIAGNOSIS — H5212 Myopia, left eye: Secondary | ICD-10-CM | POA: Diagnosis not present

## 2016-08-28 DIAGNOSIS — H40013 Open angle with borderline findings, low risk, bilateral: Secondary | ICD-10-CM | POA: Diagnosis not present

## 2016-08-29 DIAGNOSIS — Z992 Dependence on renal dialysis: Secondary | ICD-10-CM | POA: Diagnosis not present

## 2016-08-29 DIAGNOSIS — E119 Type 2 diabetes mellitus without complications: Secondary | ICD-10-CM | POA: Diagnosis not present

## 2016-08-29 DIAGNOSIS — D509 Iron deficiency anemia, unspecified: Secondary | ICD-10-CM | POA: Diagnosis not present

## 2016-08-29 DIAGNOSIS — I120 Hypertensive chronic kidney disease with stage 5 chronic kidney disease or end stage renal disease: Secondary | ICD-10-CM | POA: Diagnosis not present

## 2016-08-29 DIAGNOSIS — N186 End stage renal disease: Secondary | ICD-10-CM | POA: Diagnosis not present

## 2016-08-29 DIAGNOSIS — N2581 Secondary hyperparathyroidism of renal origin: Secondary | ICD-10-CM | POA: Diagnosis not present

## 2016-08-31 DIAGNOSIS — D509 Iron deficiency anemia, unspecified: Secondary | ICD-10-CM | POA: Diagnosis not present

## 2016-08-31 DIAGNOSIS — N186 End stage renal disease: Secondary | ICD-10-CM | POA: Diagnosis not present

## 2016-08-31 DIAGNOSIS — E119 Type 2 diabetes mellitus without complications: Secondary | ICD-10-CM | POA: Diagnosis not present

## 2016-08-31 DIAGNOSIS — N2581 Secondary hyperparathyroidism of renal origin: Secondary | ICD-10-CM | POA: Diagnosis not present

## 2016-09-03 DIAGNOSIS — N2581 Secondary hyperparathyroidism of renal origin: Secondary | ICD-10-CM | POA: Diagnosis not present

## 2016-09-03 DIAGNOSIS — E119 Type 2 diabetes mellitus without complications: Secondary | ICD-10-CM | POA: Diagnosis not present

## 2016-09-03 DIAGNOSIS — N186 End stage renal disease: Secondary | ICD-10-CM | POA: Diagnosis not present

## 2016-09-03 DIAGNOSIS — D509 Iron deficiency anemia, unspecified: Secondary | ICD-10-CM | POA: Diagnosis not present

## 2016-09-05 ENCOUNTER — Telehealth: Payer: Self-pay | Admitting: Internal Medicine

## 2016-09-05 DIAGNOSIS — D509 Iron deficiency anemia, unspecified: Secondary | ICD-10-CM | POA: Diagnosis not present

## 2016-09-05 DIAGNOSIS — N186 End stage renal disease: Secondary | ICD-10-CM | POA: Diagnosis not present

## 2016-09-05 DIAGNOSIS — N2581 Secondary hyperparathyroidism of renal origin: Secondary | ICD-10-CM | POA: Diagnosis not present

## 2016-09-05 DIAGNOSIS — E119 Type 2 diabetes mellitus without complications: Secondary | ICD-10-CM | POA: Diagnosis not present

## 2016-09-05 NOTE — Telephone Encounter (Signed)
APT. REMINDER CALL, LMTCB °

## 2016-09-06 ENCOUNTER — Ambulatory Visit (INDEPENDENT_AMBULATORY_CARE_PROVIDER_SITE_OTHER): Payer: Medicare Other | Admitting: Internal Medicine

## 2016-09-06 ENCOUNTER — Encounter: Payer: Self-pay | Admitting: Internal Medicine

## 2016-09-06 ENCOUNTER — Encounter (INDEPENDENT_AMBULATORY_CARE_PROVIDER_SITE_OTHER): Payer: Self-pay

## 2016-09-06 DIAGNOSIS — Z79899 Other long term (current) drug therapy: Secondary | ICD-10-CM | POA: Diagnosis not present

## 2016-09-06 DIAGNOSIS — Z833 Family history of diabetes mellitus: Secondary | ICD-10-CM

## 2016-09-06 DIAGNOSIS — N186 End stage renal disease: Secondary | ICD-10-CM

## 2016-09-06 DIAGNOSIS — Z8249 Family history of ischemic heart disease and other diseases of the circulatory system: Secondary | ICD-10-CM | POA: Diagnosis not present

## 2016-09-06 DIAGNOSIS — I129 Hypertensive chronic kidney disease with stage 1 through stage 4 chronic kidney disease, or unspecified chronic kidney disease: Secondary | ICD-10-CM

## 2016-09-06 DIAGNOSIS — M25571 Pain in right ankle and joints of right foot: Secondary | ICD-10-CM

## 2016-09-06 DIAGNOSIS — I10 Essential (primary) hypertension: Secondary | ICD-10-CM

## 2016-09-06 DIAGNOSIS — Z992 Dependence on renal dialysis: Secondary | ICD-10-CM | POA: Diagnosis not present

## 2016-09-06 NOTE — Progress Notes (Signed)
CC: establish new patient care, follow up of hypertension, right ankle pain   HPI: [name] is a 61 y.o. with past medical history as outlined below who presents to clinic with new concern for right ankle pain.   Right ankle pain  Three days ago he developed right ankle pain related to standing after being seated for an extended period of time. The pain is worse over the lateral side of his ankle, it is not associated with erythema or swelling. He has not had recent trauma, he does not exercise. He has tried icy hot but didn't feel the change in temperature or any relief from this. He felt some relief with compression therapy.   Hypertension and hypertensive nephropathy  Managed by nephrology, he is on losartan 100 mg qHS, spironolactone 25 mg qd, Hydralazine 50 mg TID, Carvedilol 25 mg BID, and amlodipine 10 mg qHS. Describes some difficulty with remembering to take all of these medications and frustration over his blood pressure being high even when he does remember to take them.   Please see problem list for status of the pt's chronic medical problems.  Past Medical History:  Diagnosis Date  . Chronic kidney disease   . Dialysis patient ([place])   . GERD (gastroesophageal reflux disease)   . History of blood transfusion   . Hyperlipidemia    takes Atorvastatin daily  . Hypertension    takes Amlodipine,Losartan,and Labetalol daily  . Myocardial infarction    lived in [place] maybe 7 years ago   Past Surgical History:  Procedure Laterality Date  . ARTERIOVENOUS GRAFT PLACEMENT    . AV FISTULA PLACEMENT Right 10/25/2015   Procedure: ARTERIOVENOUS (AV) FISTULA CREATION RIGHT FOREARM;  Surgeon: Angelia Mould, MD;  Location: [facility];  Service: Vascular;  Laterality: Right;  . CARDIAC CATHETERIZATION N/A 05/05/2015   Procedure: Left Heart Cath and Coronary Angiography;  Surgeon: Charolette Forward, MD;  Location: [facility];  Service: Cardiovascular;  Laterality: N/A;  .  PERIPHERAL VASCULAR CATHETERIZATION N/A 08/18/2015   Procedure: Fistulagram;  Surgeon: Conrad Stockton, MD;  Location: [facility];  Service: Cardiovascular;  Laterality: N/A;  . PERIPHERAL VASCULAR CATHETERIZATION Right 04/19/2016   Procedure: Fistulagram;  Surgeon: Conrad Trujillo Alto, MD;  Location: [facility];  Service: Cardiovascular;  Laterality: Right;  . RESECTION OF ARTERIOVENOUS FISTULA ANEURYSM Left 02/05/2013   Procedure: REPAIR OF ANEURYSM OF LEFT ARM ARTERIOVENOUS FISTULA ;  Surgeon: Serafina Mitchell, MD;  Location: [facility];  Service: Vascular;  Laterality: Left;  . RESECTION OF ARTERIOVENOUS FISTULA ANEURYSM Left 03/08/2015   Procedure: REPAIR OF LEFT ARTERIOVENOUS FISTULA PSEUDOANEURYSM;  Surgeon: Angelia Mould, MD;  Location: [facility];  Service: Vascular;  Laterality: Left;  . REVISON OF ARTERIOVENOUS FISTULA Left 54/12/5033   Procedure: PLICATION OF A LARGE ANEURYSM LEFT UPPER ARM BRACHIO-CEPHALIC ARTERIOVENOUS FISTULA ;  Surgeon: Angelia Mould, MD;  Location: [facility];  Service: Vascular;  Laterality: Left;  . SMALL INTESTINE SURGERY     Family History  Problem Relation Age of Onset  . Hypertension Mother   . Other Mother     varicose veins  . Diabetes Father   . Hypertension Father   . Hypertension Sister   . Other Sister     varicose veins  . Other Brother     varicose veins   Social hx: Denies alcohol, tobacco, or illicit drug use.  Review of Systems:  Please see each problem below for a pertinent  review of systems.  Physical Exam:  Vitals:   09/06/16 1350  BP: (!) 186/87  Pulse: 85  Temp: 98.5 F (36.9 C)  TempSrc: Oral  SpO2: 97%  Weight: 235 lb 3.2 oz (106.7 kg)   Physical Exam  Constitutional: He appears well-developed and well-nourished. No distress.  Cardiovascular: Normal rate, regular rhythm and normal heart sounds.   Pulmonary/Chest: Effort normal and breath sounds normal. No respiratory distress.  Abdominal: Soft. Bowel sounds are  normal. He exhibits no distension.  Musculoskeletal: He exhibits tenderness. He exhibits no edema or deformity.  Strength intact and equal over both ankles.  Tenderness to palpation over area anterior to lateral malleolus location of the anterior talofibular ligament.  Slight warmth to touch over this area.   Neurological: He is alert.  Skin: Skin is warm and dry. He is not diaphoretic.   Assessment & Plan:   See Encounters Tab for problem based charting.  Right ankle pain  Exam is concerning for anterior talo fibular ligament injury. Will continue conservative management. If the pain does not improve he may need xray for evaluation of fracture.  - continue RICE  - RTC in 1 month   Hypertension  BP today is concerning, he states that he hasnt taken his blood pressure medications in two days and I believe this may have something to do with the rise in his BP. He is a dialysis patient and nephrology is managing this.  - encouraged to try to take medications regularly  - continue current medication regiment  Patient discussed with Dr. Daryll Drown

## 2016-09-06 NOTE — Patient Instructions (Addendum)
It was a pleasure to meet you today Mr. Manthei,   For your ankle pain- you may have sprained your ankle, try resting your ankle and using ice. If your pain does not improve or gets worse in the next 1-2 let the clinic know, we may have to do further investigation.  Please schedule a follow up appointment in 1 month    Elastic Bandage and RICE WHAT DOES AN ELASTIC BANDAGE DO? Elastic bandages come in different shapes and sizes. They generally provide support to your injury and reduce swelling while you are healing, but they can perform different functions. Your health care provider will help you to decide what is best for your protection, recovery, or rehabilitation following an injury. WHAT ARE SOME GENERAL TIPS FOR USING AN ELASTIC BANDAGE?  Use the bandage as directed by the maker of the bandage that you are using.  Do not wrap the bandage too tightly. This may cut off the circulation in the arm or leg in the area below the bandage.  If part of your body beyond the bandage becomes blue, numb, cold, swollen, or is more painful, your bandage is most likely too tight. If this occurs, remove your bandage and reapply it more loosely.  See your health care provider if the bandage seems to be making your problems worse rather than better.  An elastic bandage should be removed and reapplied every 3-4 hours or as directed by your health care provider. WHAT IS RICE? The routine care of many injuries includes rest, ice, compression, and elevation (RICE therapy). Rest Rest is required to allow your body to heal. Generally, you can resume your routine activities when you are comfortable and have been given permission by your health care provider. Ice Icing your injury helps to keep the swelling down and it reduces pain. Do not apply ice directly to your skin.  Put ice in a plastic bag.  Place a towel between your skin and the bag.  Leave the ice on for 20 minutes, 2-3 times per day. Do this for  as long as you are directed by your health care provider. Compression Compression helps to keep swelling down, gives support, and helps with discomfort. Compression may be done with an elastic bandage. Elevation Elevation helps to reduce swelling and it decreases pain. If possible, your injured area should be placed at or above the level of your heart or the center of your chest. [place]? You should seek medical care if:  You have persistent pain and swelling.  Your symptoms are getting worse rather than improving. These symptoms may indicate that further evaluation or further X-rays are needed. Sometimes, X-rays may not show a small broken bone (fracture) until a number of days later. Make a follow-up appointment with your health care provider. Ask when your X-ray results will be ready. Make sure that you get your X-ray results. WHEN SHOULD I SEEK IMMEDIATE MEDICAL CARE? You should seek immediate medical care if:  You have a sudden onset of severe pain at or below the area of your injury.  You develop redness or increased swelling around your injury.  You have tingling or numbness at or below the area of your injury that does not improve after you remove the elastic bandage. This information is not intended to replace advice given to you by your health care provider. Make sure you discuss any questions you have with your health care provider. Document Released: 01/05/2002 Document Revised: 12/22/2015 Document  Reviewed: 03/01/2014 Elsevier Interactive Patient Education  2017 [employer].

## 2016-09-07 DIAGNOSIS — E119 Type 2 diabetes mellitus without complications: Secondary | ICD-10-CM | POA: Diagnosis not present

## 2016-09-07 DIAGNOSIS — D509 Iron deficiency anemia, unspecified: Secondary | ICD-10-CM | POA: Diagnosis not present

## 2016-09-07 DIAGNOSIS — M25571 Pain in right ankle and joints of right foot: Secondary | ICD-10-CM | POA: Insufficient documentation

## 2016-09-07 DIAGNOSIS — N2581 Secondary hyperparathyroidism of renal origin: Secondary | ICD-10-CM | POA: Diagnosis not present

## 2016-09-07 DIAGNOSIS — N186 End stage renal disease: Secondary | ICD-10-CM | POA: Diagnosis not present

## 2016-09-07 NOTE — Assessment & Plan Note (Signed)
Managed by nephrology, he is on losartan 100 mg qHS, spironolactone 25 mg qd, Hydralazine 50 mg TID, Carvedilol 25 mg BID, and amlodipine 10 mg qHS. Describes some difficulty with remembering to take all of these medications and frustration over his blood pressure being high even when he does remember to take them.   BP today is concerning, he states that he hasnt taken his blood pressure medications in two days and I believe this may have something to do with the rise in his BP. He is a dialysis patient and nephrology is managing this.  - encouraged to try to take medications regularly  - continue current medication regiment

## 2016-09-07 NOTE — Assessment & Plan Note (Signed)
Three days ago he developed right ankle pain related to standing after being seated for an extended period of time. The pain is worse over the lateral side of his ankle, it is not associated with erythema or swelling. He has not had recent trauma, he does not exercise. He has tried icy hot but didn't feel the change in temperature or any relief from this. He felt some relief with compression therapy.   Exam is concerning for anterior talo fibular ligament injury. Will continue conservative management. If the pain does not improve he may need xray for evaluation of fracture.  - continue RICE  - RTC in 1 month

## 2016-09-10 DIAGNOSIS — N186 End stage renal disease: Secondary | ICD-10-CM | POA: Diagnosis not present

## 2016-09-10 DIAGNOSIS — E119 Type 2 diabetes mellitus without complications: Secondary | ICD-10-CM | POA: Diagnosis not present

## 2016-09-10 DIAGNOSIS — N2581 Secondary hyperparathyroidism of renal origin: Secondary | ICD-10-CM | POA: Diagnosis not present

## 2016-09-10 DIAGNOSIS — D509 Iron deficiency anemia, unspecified: Secondary | ICD-10-CM | POA: Diagnosis not present

## 2016-09-12 DIAGNOSIS — N186 End stage renal disease: Secondary | ICD-10-CM | POA: Diagnosis not present

## 2016-09-12 DIAGNOSIS — D509 Iron deficiency anemia, unspecified: Secondary | ICD-10-CM | POA: Diagnosis not present

## 2016-09-12 DIAGNOSIS — E119 Type 2 diabetes mellitus without complications: Secondary | ICD-10-CM | POA: Diagnosis not present

## 2016-09-12 DIAGNOSIS — N2581 Secondary hyperparathyroidism of renal origin: Secondary | ICD-10-CM | POA: Diagnosis not present

## 2016-09-14 DIAGNOSIS — E119 Type 2 diabetes mellitus without complications: Secondary | ICD-10-CM | POA: Diagnosis not present

## 2016-09-14 DIAGNOSIS — N186 End stage renal disease: Secondary | ICD-10-CM | POA: Diagnosis not present

## 2016-09-14 DIAGNOSIS — N2581 Secondary hyperparathyroidism of renal origin: Secondary | ICD-10-CM | POA: Diagnosis not present

## 2016-09-14 DIAGNOSIS — D509 Iron deficiency anemia, unspecified: Secondary | ICD-10-CM | POA: Diagnosis not present

## 2016-09-17 DIAGNOSIS — N186 End stage renal disease: Secondary | ICD-10-CM | POA: Diagnosis not present

## 2016-09-17 DIAGNOSIS — E119 Type 2 diabetes mellitus without complications: Secondary | ICD-10-CM | POA: Diagnosis not present

## 2016-09-17 DIAGNOSIS — N2581 Secondary hyperparathyroidism of renal origin: Secondary | ICD-10-CM | POA: Diagnosis not present

## 2016-09-17 DIAGNOSIS — D509 Iron deficiency anemia, unspecified: Secondary | ICD-10-CM | POA: Diagnosis not present

## 2016-09-19 DIAGNOSIS — D509 Iron deficiency anemia, unspecified: Secondary | ICD-10-CM | POA: Diagnosis not present

## 2016-09-19 DIAGNOSIS — N186 End stage renal disease: Secondary | ICD-10-CM | POA: Diagnosis not present

## 2016-09-19 DIAGNOSIS — E119 Type 2 diabetes mellitus without complications: Secondary | ICD-10-CM | POA: Diagnosis not present

## 2016-09-19 DIAGNOSIS — N2581 Secondary hyperparathyroidism of renal origin: Secondary | ICD-10-CM | POA: Diagnosis not present

## 2016-09-20 NOTE — Progress Notes (Signed)
Internal Medicine Clinic Attending  Case discussed with Dr. Hetty Ely soon after the resident saw the patient.  We reviewed the resident's history and exam and pertinent patient test results.  I agree with the assessment, diagnosis, and plan of care documented in the resident's note.

## 2016-09-21 DIAGNOSIS — D509 Iron deficiency anemia, unspecified: Secondary | ICD-10-CM | POA: Diagnosis not present

## 2016-09-21 DIAGNOSIS — E119 Type 2 diabetes mellitus without complications: Secondary | ICD-10-CM | POA: Diagnosis not present

## 2016-09-21 DIAGNOSIS — N2581 Secondary hyperparathyroidism of renal origin: Secondary | ICD-10-CM | POA: Diagnosis not present

## 2016-09-21 DIAGNOSIS — N186 End stage renal disease: Secondary | ICD-10-CM | POA: Diagnosis not present

## 2016-09-24 DIAGNOSIS — D509 Iron deficiency anemia, unspecified: Secondary | ICD-10-CM | POA: Diagnosis not present

## 2016-09-24 DIAGNOSIS — N2581 Secondary hyperparathyroidism of renal origin: Secondary | ICD-10-CM | POA: Diagnosis not present

## 2016-09-24 DIAGNOSIS — E119 Type 2 diabetes mellitus without complications: Secondary | ICD-10-CM | POA: Diagnosis not present

## 2016-09-24 DIAGNOSIS — N186 End stage renal disease: Secondary | ICD-10-CM | POA: Diagnosis not present

## 2016-09-26 DIAGNOSIS — D509 Iron deficiency anemia, unspecified: Secondary | ICD-10-CM | POA: Diagnosis not present

## 2016-09-26 DIAGNOSIS — N2581 Secondary hyperparathyroidism of renal origin: Secondary | ICD-10-CM | POA: Diagnosis not present

## 2016-09-26 DIAGNOSIS — N186 End stage renal disease: Secondary | ICD-10-CM | POA: Diagnosis not present

## 2016-09-26 DIAGNOSIS — E119 Type 2 diabetes mellitus without complications: Secondary | ICD-10-CM | POA: Diagnosis not present

## 2016-09-26 DIAGNOSIS — Z992 Dependence on renal dialysis: Secondary | ICD-10-CM | POA: Diagnosis not present

## 2016-09-26 DIAGNOSIS — I120 Hypertensive chronic kidney disease with stage 5 chronic kidney disease or end stage renal disease: Secondary | ICD-10-CM | POA: Diagnosis not present

## 2016-09-28 DIAGNOSIS — E119 Type 2 diabetes mellitus without complications: Secondary | ICD-10-CM | POA: Diagnosis not present

## 2016-09-28 DIAGNOSIS — N2581 Secondary hyperparathyroidism of renal origin: Secondary | ICD-10-CM | POA: Diagnosis not present

## 2016-09-28 DIAGNOSIS — N186 End stage renal disease: Secondary | ICD-10-CM | POA: Diagnosis not present

## 2016-09-28 DIAGNOSIS — D509 Iron deficiency anemia, unspecified: Secondary | ICD-10-CM | POA: Diagnosis not present

## 2016-09-28 DIAGNOSIS — D631 Anemia in chronic kidney disease: Secondary | ICD-10-CM | POA: Diagnosis not present

## 2016-10-01 DIAGNOSIS — E119 Type 2 diabetes mellitus without complications: Secondary | ICD-10-CM | POA: Diagnosis not present

## 2016-10-01 DIAGNOSIS — N2581 Secondary hyperparathyroidism of renal origin: Secondary | ICD-10-CM | POA: Diagnosis not present

## 2016-10-01 DIAGNOSIS — N186 End stage renal disease: Secondary | ICD-10-CM | POA: Diagnosis not present

## 2016-10-01 DIAGNOSIS — D631 Anemia in chronic kidney disease: Secondary | ICD-10-CM | POA: Diagnosis not present

## 2016-10-01 DIAGNOSIS — D509 Iron deficiency anemia, unspecified: Secondary | ICD-10-CM | POA: Diagnosis not present

## 2016-10-03 DIAGNOSIS — D509 Iron deficiency anemia, unspecified: Secondary | ICD-10-CM | POA: Diagnosis not present

## 2016-10-03 DIAGNOSIS — N186 End stage renal disease: Secondary | ICD-10-CM | POA: Diagnosis not present

## 2016-10-03 DIAGNOSIS — N2581 Secondary hyperparathyroidism of renal origin: Secondary | ICD-10-CM | POA: Diagnosis not present

## 2016-10-03 DIAGNOSIS — D631 Anemia in chronic kidney disease: Secondary | ICD-10-CM | POA: Diagnosis not present

## 2016-10-03 DIAGNOSIS — E119 Type 2 diabetes mellitus without complications: Secondary | ICD-10-CM | POA: Diagnosis not present

## 2016-10-05 DIAGNOSIS — D631 Anemia in chronic kidney disease: Secondary | ICD-10-CM | POA: Diagnosis not present

## 2016-10-05 DIAGNOSIS — E119 Type 2 diabetes mellitus without complications: Secondary | ICD-10-CM | POA: Diagnosis not present

## 2016-10-05 DIAGNOSIS — N186 End stage renal disease: Secondary | ICD-10-CM | POA: Diagnosis not present

## 2016-10-05 DIAGNOSIS — D509 Iron deficiency anemia, unspecified: Secondary | ICD-10-CM | POA: Diagnosis not present

## 2016-10-05 DIAGNOSIS — N2581 Secondary hyperparathyroidism of renal origin: Secondary | ICD-10-CM | POA: Diagnosis not present

## 2016-10-08 DIAGNOSIS — N186 End stage renal disease: Secondary | ICD-10-CM | POA: Diagnosis not present

## 2016-10-08 DIAGNOSIS — D631 Anemia in chronic kidney disease: Secondary | ICD-10-CM | POA: Diagnosis not present

## 2016-10-08 DIAGNOSIS — N2581 Secondary hyperparathyroidism of renal origin: Secondary | ICD-10-CM | POA: Diagnosis not present

## 2016-10-08 DIAGNOSIS — E119 Type 2 diabetes mellitus without complications: Secondary | ICD-10-CM | POA: Diagnosis not present

## 2016-10-08 DIAGNOSIS — D509 Iron deficiency anemia, unspecified: Secondary | ICD-10-CM | POA: Diagnosis not present

## 2016-10-09 ENCOUNTER — Encounter: Payer: Self-pay | Admitting: Internal Medicine

## 2016-10-09 ENCOUNTER — Encounter: Payer: Medicare Other | Admitting: Internal Medicine

## 2016-10-10 DIAGNOSIS — N2581 Secondary hyperparathyroidism of renal origin: Secondary | ICD-10-CM | POA: Diagnosis not present

## 2016-10-10 DIAGNOSIS — D631 Anemia in chronic kidney disease: Secondary | ICD-10-CM | POA: Diagnosis not present

## 2016-10-10 DIAGNOSIS — D509 Iron deficiency anemia, unspecified: Secondary | ICD-10-CM | POA: Diagnosis not present

## 2016-10-10 DIAGNOSIS — E119 Type 2 diabetes mellitus without complications: Secondary | ICD-10-CM | POA: Diagnosis not present

## 2016-10-10 DIAGNOSIS — N186 End stage renal disease: Secondary | ICD-10-CM | POA: Diagnosis not present

## 2016-10-12 DIAGNOSIS — N2581 Secondary hyperparathyroidism of renal origin: Secondary | ICD-10-CM | POA: Diagnosis not present

## 2016-10-12 DIAGNOSIS — E119 Type 2 diabetes mellitus without complications: Secondary | ICD-10-CM | POA: Diagnosis not present

## 2016-10-12 DIAGNOSIS — D631 Anemia in chronic kidney disease: Secondary | ICD-10-CM | POA: Diagnosis not present

## 2016-10-12 DIAGNOSIS — D509 Iron deficiency anemia, unspecified: Secondary | ICD-10-CM | POA: Diagnosis not present

## 2016-10-12 DIAGNOSIS — N186 End stage renal disease: Secondary | ICD-10-CM | POA: Diagnosis not present

## 2016-10-15 DIAGNOSIS — N186 End stage renal disease: Secondary | ICD-10-CM | POA: Diagnosis not present

## 2016-10-15 DIAGNOSIS — D631 Anemia in chronic kidney disease: Secondary | ICD-10-CM | POA: Diagnosis not present

## 2016-10-15 DIAGNOSIS — E119 Type 2 diabetes mellitus without complications: Secondary | ICD-10-CM | POA: Diagnosis not present

## 2016-10-15 DIAGNOSIS — D509 Iron deficiency anemia, unspecified: Secondary | ICD-10-CM | POA: Diagnosis not present

## 2016-10-15 DIAGNOSIS — N2581 Secondary hyperparathyroidism of renal origin: Secondary | ICD-10-CM | POA: Diagnosis not present

## 2016-10-16 ENCOUNTER — Encounter: Payer: Self-pay | Admitting: Internal Medicine

## 2016-10-16 ENCOUNTER — Ambulatory Visit (INDEPENDENT_AMBULATORY_CARE_PROVIDER_SITE_OTHER): Payer: Medicare Other | Admitting: Internal Medicine

## 2016-10-16 VITALS — BP 115/68 | HR 79 | Temp 98.4°F | Wt 234.0 lb

## 2016-10-16 DIAGNOSIS — I120 Hypertensive chronic kidney disease with stage 5 chronic kidney disease or end stage renal disease: Secondary | ICD-10-CM

## 2016-10-16 DIAGNOSIS — Z992 Dependence on renal dialysis: Secondary | ICD-10-CM

## 2016-10-16 DIAGNOSIS — G8929 Other chronic pain: Secondary | ICD-10-CM

## 2016-10-16 DIAGNOSIS — N186 End stage renal disease: Secondary | ICD-10-CM | POA: Diagnosis not present

## 2016-10-16 DIAGNOSIS — I10 Essential (primary) hypertension: Secondary | ICD-10-CM

## 2016-10-16 DIAGNOSIS — R2689 Other abnormalities of gait and mobility: Secondary | ICD-10-CM

## 2016-10-16 DIAGNOSIS — Z79899 Other long term (current) drug therapy: Secondary | ICD-10-CM

## 2016-10-16 DIAGNOSIS — M25571 Pain in right ankle and joints of right foot: Secondary | ICD-10-CM | POA: Diagnosis not present

## 2016-10-16 DIAGNOSIS — Z7689 Persons encountering health services in other specified circumstances: Secondary | ICD-10-CM | POA: Insufficient documentation

## 2016-10-16 NOTE — Patient Instructions (Addendum)
Please call the clinic if you experience ankle pain in the future.

## 2016-10-17 DIAGNOSIS — D509 Iron deficiency anemia, unspecified: Secondary | ICD-10-CM | POA: Diagnosis not present

## 2016-10-17 DIAGNOSIS — D631 Anemia in chronic kidney disease: Secondary | ICD-10-CM | POA: Diagnosis not present

## 2016-10-17 DIAGNOSIS — N186 End stage renal disease: Secondary | ICD-10-CM | POA: Diagnosis not present

## 2016-10-17 DIAGNOSIS — N2581 Secondary hyperparathyroidism of renal origin: Secondary | ICD-10-CM | POA: Diagnosis not present

## 2016-10-17 DIAGNOSIS — E119 Type 2 diabetes mellitus without complications: Secondary | ICD-10-CM | POA: Diagnosis not present

## 2016-10-17 NOTE — Progress Notes (Signed)
   CC: Patient is here for follow-up of his right ankle pain.   HPI:  [name] is a 61 y.o. male with a past medical history of conditions listed below presenting to the clinic for follow-up of his right ankle pain. Hypertension was also discussed during this visit. Please see problem based charting for the status of the patient's current and chronic medical conditions.   Past Medical History:  Diagnosis Date  . Chronic kidney disease   . Dialysis patient ([facility])   . GERD (gastroesophageal reflux disease)   . History of blood transfusion   . Hyperlipidemia    takes Atorvastatin daily  . Hypertension    takes Amlodipine,Losartan,and Labetalol daily  . Myocardial infarction    lived in [place] maybe 7 years ago  . Small bowel obstruction     Review of Systems:  Pertinent positives mentioned in HPI. Remainder of all ROS negative.   Physical Exam:  Vitals:   10/16/16 1501  BP: 115/68  Pulse: 79  Temp: 98.4 F (36.9 C)  TempSrc: Oral  SpO2: 98%  Weight: 234 lb (106.1 kg)   Physical Exam  Constitutional: He is oriented to person, place, and time. He appears well-developed and well-nourished. No distress.  HENT:  Head: Normocephalic and atraumatic.  Mouth/Throat: Oropharynx is clear and moist.  Eyes: Right eye exhibits no discharge. Left eye exhibits no discharge.  Neck: Neck supple. No tracheal deviation present.  Cardiovascular: Normal rate, regular rhythm and intact distal pulses.   Pulmonary/Chest: Effort normal and breath sounds normal. No respiratory distress.  Abdominal: Soft. Bowel sounds are normal. He exhibits no distension. There is no tenderness.  Musculoskeletal: He exhibits no edema, tenderness or deformity.  Mildly antalgic gait Right ankle: No tenderness on palpation. Squeeze test negative. Anterior drawer sign negative. Normal external and internal  rotation. Decreased inversion and eversion. Decreased talar tilt. Normal dorsiflexion and plantar flexion.    Neurological: He is alert and oriented to person, place, and time.  Skin: Skin is warm and dry.    Assessment & Plan:   See Encounters Tab for problem based charting.  Patient discussed with Dr. Angelia Mould

## 2016-10-17 NOTE — Assessment & Plan Note (Signed)
BP Readings from Last 3 Encounters:  10/16/16 115/68  09/06/16 (!) 186/87  06/20/16 (!) 173/89    Lab Results  Component Value Date   NA 138 06/01/2016   K 3.5 06/01/2016   CREATININE 8.79 (H) 06/01/2016    Assessment: Blood pressure control:  well-controlled Comments: Currently on losartan 100 mg at bedtime, spironolactone 25 mg daily, hydralazine 50 mg 3 times a day, carvedilol 25 mg twice daily, and amlodipine 10 mg at bedtime. He is end-stage renal disease on hemodialysis. Patient states his blood pressure medications are refilled by his nephrologist Dr. Joelyn Oms.  Plan: Medications:  continue current medications Educational resources provided:   Educated patient about healthy eating and exercise.

## 2016-10-17 NOTE — Assessment & Plan Note (Signed)
History of present illness Patient denies having any ankle pain at this visit. Reports having an injury to his ankle in 2006 after falling 15 feet at his job; he used to work as a Chief Strategy Officer. Reports having intermittent pain and swelling after standing for long periods of time in his right ankle since the time of this injury. In addition, he has been "limping" since then. States he was told he had no fractures but was in crutches and received physical therapy for 6 months at that time. Denies having any swelling in his left lower extremity.   Assessment Chronic ankle pain likely due to OA resulting from prior ligamentous injury. On exam, patient has no pain on palpation of the ankle. He did exhibit decreased range of motion with inversion and eversion. In addition, he had decreased talar tilt.  Plan -Since the patient is not complaining of any pain at present, I advised him to call the clinic if he experiences any ankle pain in the future. Consider prescribing Voltaren gel at that time.

## 2016-10-19 DIAGNOSIS — D631 Anemia in chronic kidney disease: Secondary | ICD-10-CM | POA: Diagnosis not present

## 2016-10-19 DIAGNOSIS — N2581 Secondary hyperparathyroidism of renal origin: Secondary | ICD-10-CM | POA: Diagnosis not present

## 2016-10-19 DIAGNOSIS — E119 Type 2 diabetes mellitus without complications: Secondary | ICD-10-CM | POA: Diagnosis not present

## 2016-10-19 DIAGNOSIS — N186 End stage renal disease: Secondary | ICD-10-CM | POA: Diagnosis not present

## 2016-10-19 DIAGNOSIS — D509 Iron deficiency anemia, unspecified: Secondary | ICD-10-CM | POA: Diagnosis not present

## 2016-10-22 DIAGNOSIS — E119 Type 2 diabetes mellitus without complications: Secondary | ICD-10-CM | POA: Diagnosis not present

## 2016-10-22 DIAGNOSIS — N186 End stage renal disease: Secondary | ICD-10-CM | POA: Diagnosis not present

## 2016-10-22 DIAGNOSIS — N2581 Secondary hyperparathyroidism of renal origin: Secondary | ICD-10-CM | POA: Diagnosis not present

## 2016-10-22 DIAGNOSIS — D509 Iron deficiency anemia, unspecified: Secondary | ICD-10-CM | POA: Diagnosis not present

## 2016-10-22 DIAGNOSIS — D631 Anemia in chronic kidney disease: Secondary | ICD-10-CM | POA: Diagnosis not present

## 2016-10-22 NOTE — Progress Notes (Signed)
Internal Medicine Clinic Attending  Case discussed with Dr. Marlowe Sax soon after the resident saw the patient.  We reviewed the resident's history and exam and pertinent patient test results.  I agree with the assessment, diagnosis, and plan of care documented in the resident's note.

## 2016-10-24 DIAGNOSIS — N2581 Secondary hyperparathyroidism of renal origin: Secondary | ICD-10-CM | POA: Diagnosis not present

## 2016-10-24 DIAGNOSIS — D631 Anemia in chronic kidney disease: Secondary | ICD-10-CM | POA: Diagnosis not present

## 2016-10-24 DIAGNOSIS — E119 Type 2 diabetes mellitus without complications: Secondary | ICD-10-CM | POA: Diagnosis not present

## 2016-10-24 DIAGNOSIS — D509 Iron deficiency anemia, unspecified: Secondary | ICD-10-CM | POA: Diagnosis not present

## 2016-10-24 DIAGNOSIS — N186 End stage renal disease: Secondary | ICD-10-CM | POA: Diagnosis not present

## 2016-10-26 DIAGNOSIS — D509 Iron deficiency anemia, unspecified: Secondary | ICD-10-CM | POA: Diagnosis not present

## 2016-10-26 DIAGNOSIS — E119 Type 2 diabetes mellitus without complications: Secondary | ICD-10-CM | POA: Diagnosis not present

## 2016-10-26 DIAGNOSIS — N2581 Secondary hyperparathyroidism of renal origin: Secondary | ICD-10-CM | POA: Diagnosis not present

## 2016-10-26 DIAGNOSIS — N186 End stage renal disease: Secondary | ICD-10-CM | POA: Diagnosis not present

## 2016-10-26 DIAGNOSIS — D631 Anemia in chronic kidney disease: Secondary | ICD-10-CM | POA: Diagnosis not present

## 2016-10-27 DIAGNOSIS — Z992 Dependence on renal dialysis: Secondary | ICD-10-CM | POA: Diagnosis not present

## 2016-10-27 DIAGNOSIS — N186 End stage renal disease: Secondary | ICD-10-CM | POA: Diagnosis not present

## 2016-10-27 DIAGNOSIS — I120 Hypertensive chronic kidney disease with stage 5 chronic kidney disease or end stage renal disease: Secondary | ICD-10-CM | POA: Diagnosis not present

## 2016-10-29 DIAGNOSIS — N186 End stage renal disease: Secondary | ICD-10-CM | POA: Diagnosis not present

## 2016-10-29 DIAGNOSIS — N2581 Secondary hyperparathyroidism of renal origin: Secondary | ICD-10-CM | POA: Diagnosis not present

## 2016-10-29 DIAGNOSIS — E119 Type 2 diabetes mellitus without complications: Secondary | ICD-10-CM | POA: Diagnosis not present

## 2016-10-29 DIAGNOSIS — D631 Anemia in chronic kidney disease: Secondary | ICD-10-CM | POA: Diagnosis not present

## 2016-10-29 DIAGNOSIS — D509 Iron deficiency anemia, unspecified: Secondary | ICD-10-CM | POA: Diagnosis not present

## 2016-10-31 DIAGNOSIS — D631 Anemia in chronic kidney disease: Secondary | ICD-10-CM | POA: Diagnosis not present

## 2016-10-31 DIAGNOSIS — N2581 Secondary hyperparathyroidism of renal origin: Secondary | ICD-10-CM | POA: Diagnosis not present

## 2016-10-31 DIAGNOSIS — N186 End stage renal disease: Secondary | ICD-10-CM | POA: Diagnosis not present

## 2016-10-31 DIAGNOSIS — E119 Type 2 diabetes mellitus without complications: Secondary | ICD-10-CM | POA: Diagnosis not present

## 2016-10-31 DIAGNOSIS — D509 Iron deficiency anemia, unspecified: Secondary | ICD-10-CM | POA: Diagnosis not present

## 2016-11-02 DIAGNOSIS — D631 Anemia in chronic kidney disease: Secondary | ICD-10-CM | POA: Diagnosis not present

## 2016-11-02 DIAGNOSIS — D509 Iron deficiency anemia, unspecified: Secondary | ICD-10-CM | POA: Diagnosis not present

## 2016-11-02 DIAGNOSIS — E119 Type 2 diabetes mellitus without complications: Secondary | ICD-10-CM | POA: Diagnosis not present

## 2016-11-02 DIAGNOSIS — N2581 Secondary hyperparathyroidism of renal origin: Secondary | ICD-10-CM | POA: Diagnosis not present

## 2016-11-02 DIAGNOSIS — N186 End stage renal disease: Secondary | ICD-10-CM | POA: Diagnosis not present

## 2016-11-05 DIAGNOSIS — N186 End stage renal disease: Secondary | ICD-10-CM | POA: Diagnosis not present

## 2016-11-05 DIAGNOSIS — D631 Anemia in chronic kidney disease: Secondary | ICD-10-CM | POA: Diagnosis not present

## 2016-11-05 DIAGNOSIS — E119 Type 2 diabetes mellitus without complications: Secondary | ICD-10-CM | POA: Diagnosis not present

## 2016-11-05 DIAGNOSIS — D509 Iron deficiency anemia, unspecified: Secondary | ICD-10-CM | POA: Diagnosis not present

## 2016-11-05 DIAGNOSIS — N2581 Secondary hyperparathyroidism of renal origin: Secondary | ICD-10-CM | POA: Diagnosis not present

## 2016-11-07 DIAGNOSIS — D631 Anemia in chronic kidney disease: Secondary | ICD-10-CM | POA: Diagnosis not present

## 2016-11-07 DIAGNOSIS — D509 Iron deficiency anemia, unspecified: Secondary | ICD-10-CM | POA: Diagnosis not present

## 2016-11-07 DIAGNOSIS — N2581 Secondary hyperparathyroidism of renal origin: Secondary | ICD-10-CM | POA: Diagnosis not present

## 2016-11-07 DIAGNOSIS — E119 Type 2 diabetes mellitus without complications: Secondary | ICD-10-CM | POA: Diagnosis not present

## 2016-11-07 DIAGNOSIS — N186 End stage renal disease: Secondary | ICD-10-CM | POA: Diagnosis not present

## 2016-11-09 DIAGNOSIS — D631 Anemia in chronic kidney disease: Secondary | ICD-10-CM | POA: Diagnosis not present

## 2016-11-09 DIAGNOSIS — D509 Iron deficiency anemia, unspecified: Secondary | ICD-10-CM | POA: Diagnosis not present

## 2016-11-09 DIAGNOSIS — N186 End stage renal disease: Secondary | ICD-10-CM | POA: Diagnosis not present

## 2016-11-09 DIAGNOSIS — E119 Type 2 diabetes mellitus without complications: Secondary | ICD-10-CM | POA: Diagnosis not present

## 2016-11-09 DIAGNOSIS — N2581 Secondary hyperparathyroidism of renal origin: Secondary | ICD-10-CM | POA: Diagnosis not present

## 2016-11-12 DIAGNOSIS — E119 Type 2 diabetes mellitus without complications: Secondary | ICD-10-CM | POA: Diagnosis not present

## 2016-11-12 DIAGNOSIS — N186 End stage renal disease: Secondary | ICD-10-CM | POA: Diagnosis not present

## 2016-11-12 DIAGNOSIS — D631 Anemia in chronic kidney disease: Secondary | ICD-10-CM | POA: Diagnosis not present

## 2016-11-12 DIAGNOSIS — N2581 Secondary hyperparathyroidism of renal origin: Secondary | ICD-10-CM | POA: Diagnosis not present

## 2016-11-12 DIAGNOSIS — D509 Iron deficiency anemia, unspecified: Secondary | ICD-10-CM | POA: Diagnosis not present

## 2016-11-14 DIAGNOSIS — N186 End stage renal disease: Secondary | ICD-10-CM | POA: Diagnosis not present

## 2016-11-14 DIAGNOSIS — D631 Anemia in chronic kidney disease: Secondary | ICD-10-CM | POA: Diagnosis not present

## 2016-11-14 DIAGNOSIS — E119 Type 2 diabetes mellitus without complications: Secondary | ICD-10-CM | POA: Diagnosis not present

## 2016-11-14 DIAGNOSIS — D509 Iron deficiency anemia, unspecified: Secondary | ICD-10-CM | POA: Diagnosis not present

## 2016-11-14 DIAGNOSIS — N2581 Secondary hyperparathyroidism of renal origin: Secondary | ICD-10-CM | POA: Diagnosis not present

## 2016-11-16 DIAGNOSIS — N2581 Secondary hyperparathyroidism of renal origin: Secondary | ICD-10-CM | POA: Diagnosis not present

## 2016-11-16 DIAGNOSIS — N186 End stage renal disease: Secondary | ICD-10-CM | POA: Diagnosis not present

## 2016-11-16 DIAGNOSIS — D631 Anemia in chronic kidney disease: Secondary | ICD-10-CM | POA: Diagnosis not present

## 2016-11-16 DIAGNOSIS — E119 Type 2 diabetes mellitus without complications: Secondary | ICD-10-CM | POA: Diagnosis not present

## 2016-11-16 DIAGNOSIS — D509 Iron deficiency anemia, unspecified: Secondary | ICD-10-CM | POA: Diagnosis not present

## 2016-11-19 DIAGNOSIS — D631 Anemia in chronic kidney disease: Secondary | ICD-10-CM | POA: Diagnosis not present

## 2016-11-19 DIAGNOSIS — E119 Type 2 diabetes mellitus without complications: Secondary | ICD-10-CM | POA: Diagnosis not present

## 2016-11-19 DIAGNOSIS — D509 Iron deficiency anemia, unspecified: Secondary | ICD-10-CM | POA: Diagnosis not present

## 2016-11-19 DIAGNOSIS — N2581 Secondary hyperparathyroidism of renal origin: Secondary | ICD-10-CM | POA: Diagnosis not present

## 2016-11-19 DIAGNOSIS — N186 End stage renal disease: Secondary | ICD-10-CM | POA: Diagnosis not present

## 2016-11-21 DIAGNOSIS — E1151 Type 2 diabetes mellitus with diabetic peripheral angiopathy without gangrene: Secondary | ICD-10-CM | POA: Diagnosis not present

## 2016-11-21 DIAGNOSIS — N186 End stage renal disease: Secondary | ICD-10-CM | POA: Diagnosis not present

## 2016-11-21 DIAGNOSIS — D509 Iron deficiency anemia, unspecified: Secondary | ICD-10-CM | POA: Diagnosis not present

## 2016-11-21 DIAGNOSIS — E119 Type 2 diabetes mellitus without complications: Secondary | ICD-10-CM | POA: Diagnosis not present

## 2016-11-21 DIAGNOSIS — D631 Anemia in chronic kidney disease: Secondary | ICD-10-CM | POA: Diagnosis not present

## 2016-11-21 DIAGNOSIS — N2581 Secondary hyperparathyroidism of renal origin: Secondary | ICD-10-CM | POA: Diagnosis not present

## 2016-11-23 DIAGNOSIS — N2581 Secondary hyperparathyroidism of renal origin: Secondary | ICD-10-CM | POA: Diagnosis not present

## 2016-11-23 DIAGNOSIS — N186 End stage renal disease: Secondary | ICD-10-CM | POA: Diagnosis not present

## 2016-11-23 DIAGNOSIS — E119 Type 2 diabetes mellitus without complications: Secondary | ICD-10-CM | POA: Diagnosis not present

## 2016-11-23 DIAGNOSIS — D509 Iron deficiency anemia, unspecified: Secondary | ICD-10-CM | POA: Diagnosis not present

## 2016-11-23 DIAGNOSIS — D631 Anemia in chronic kidney disease: Secondary | ICD-10-CM | POA: Diagnosis not present

## 2016-11-26 DIAGNOSIS — N186 End stage renal disease: Secondary | ICD-10-CM | POA: Diagnosis not present

## 2016-11-26 DIAGNOSIS — N2581 Secondary hyperparathyroidism of renal origin: Secondary | ICD-10-CM | POA: Diagnosis not present

## 2016-11-26 DIAGNOSIS — E119 Type 2 diabetes mellitus without complications: Secondary | ICD-10-CM | POA: Diagnosis not present

## 2016-11-26 DIAGNOSIS — D631 Anemia in chronic kidney disease: Secondary | ICD-10-CM | POA: Diagnosis not present

## 2016-11-26 DIAGNOSIS — Z992 Dependence on renal dialysis: Secondary | ICD-10-CM | POA: Diagnosis not present

## 2016-11-26 DIAGNOSIS — D509 Iron deficiency anemia, unspecified: Secondary | ICD-10-CM | POA: Diagnosis not present

## 2016-11-26 DIAGNOSIS — I120 Hypertensive chronic kidney disease with stage 5 chronic kidney disease or end stage renal disease: Secondary | ICD-10-CM | POA: Diagnosis not present

## 2016-11-28 DIAGNOSIS — N186 End stage renal disease: Secondary | ICD-10-CM | POA: Diagnosis not present

## 2016-11-28 DIAGNOSIS — N2581 Secondary hyperparathyroidism of renal origin: Secondary | ICD-10-CM | POA: Diagnosis not present

## 2016-11-28 DIAGNOSIS — D631 Anemia in chronic kidney disease: Secondary | ICD-10-CM | POA: Diagnosis not present

## 2016-11-28 DIAGNOSIS — D509 Iron deficiency anemia, unspecified: Secondary | ICD-10-CM | POA: Diagnosis not present

## 2016-11-28 DIAGNOSIS — E119 Type 2 diabetes mellitus without complications: Secondary | ICD-10-CM | POA: Diagnosis not present

## 2016-11-30 DIAGNOSIS — D631 Anemia in chronic kidney disease: Secondary | ICD-10-CM | POA: Diagnosis not present

## 2016-11-30 DIAGNOSIS — E119 Type 2 diabetes mellitus without complications: Secondary | ICD-10-CM | POA: Diagnosis not present

## 2016-11-30 DIAGNOSIS — N2581 Secondary hyperparathyroidism of renal origin: Secondary | ICD-10-CM | POA: Diagnosis not present

## 2016-11-30 DIAGNOSIS — D509 Iron deficiency anemia, unspecified: Secondary | ICD-10-CM | POA: Diagnosis not present

## 2016-11-30 DIAGNOSIS — N186 End stage renal disease: Secondary | ICD-10-CM | POA: Diagnosis not present

## 2016-12-03 DIAGNOSIS — N186 End stage renal disease: Secondary | ICD-10-CM | POA: Diagnosis not present

## 2016-12-03 DIAGNOSIS — E119 Type 2 diabetes mellitus without complications: Secondary | ICD-10-CM | POA: Diagnosis not present

## 2016-12-03 DIAGNOSIS — N2581 Secondary hyperparathyroidism of renal origin: Secondary | ICD-10-CM | POA: Diagnosis not present

## 2016-12-03 DIAGNOSIS — D509 Iron deficiency anemia, unspecified: Secondary | ICD-10-CM | POA: Diagnosis not present

## 2016-12-03 DIAGNOSIS — D631 Anemia in chronic kidney disease: Secondary | ICD-10-CM | POA: Diagnosis not present

## 2016-12-05 DIAGNOSIS — D631 Anemia in chronic kidney disease: Secondary | ICD-10-CM | POA: Diagnosis not present

## 2016-12-05 DIAGNOSIS — E119 Type 2 diabetes mellitus without complications: Secondary | ICD-10-CM | POA: Diagnosis not present

## 2016-12-05 DIAGNOSIS — N186 End stage renal disease: Secondary | ICD-10-CM | POA: Diagnosis not present

## 2016-12-05 DIAGNOSIS — D509 Iron deficiency anemia, unspecified: Secondary | ICD-10-CM | POA: Diagnosis not present

## 2016-12-05 DIAGNOSIS — N2581 Secondary hyperparathyroidism of renal origin: Secondary | ICD-10-CM | POA: Diagnosis not present

## 2016-12-06 DIAGNOSIS — N2581 Secondary hyperparathyroidism of renal origin: Secondary | ICD-10-CM | POA: Diagnosis not present

## 2016-12-06 DIAGNOSIS — D509 Iron deficiency anemia, unspecified: Secondary | ICD-10-CM | POA: Diagnosis not present

## 2016-12-06 DIAGNOSIS — E119 Type 2 diabetes mellitus without complications: Secondary | ICD-10-CM | POA: Diagnosis not present

## 2016-12-06 DIAGNOSIS — D631 Anemia in chronic kidney disease: Secondary | ICD-10-CM | POA: Diagnosis not present

## 2016-12-06 DIAGNOSIS — N186 End stage renal disease: Secondary | ICD-10-CM | POA: Diagnosis not present

## 2016-12-15 ENCOUNTER — Emergency Department (HOSPITAL_COMMUNITY): Payer: Medicare Other

## 2016-12-15 ENCOUNTER — Observation Stay (HOSPITAL_COMMUNITY)
Admission: EM | Admit: 2016-12-15 | Discharge: 2016-12-16 | Disposition: A | Discharge status: Home / Self Care | Payer: Medicare Other | Attending: Oncology | Admitting: Oncology

## 2016-12-15 ENCOUNTER — Encounter (HOSPITAL_COMMUNITY): Payer: Self-pay

## 2016-12-15 DIAGNOSIS — I252 Old myocardial infarction: Secondary | ICD-10-CM | POA: Insufficient documentation

## 2016-12-15 DIAGNOSIS — Z7982 Long term (current) use of aspirin: Secondary | ICD-10-CM | POA: Insufficient documentation

## 2016-12-15 DIAGNOSIS — Z8249 Family history of ischemic heart disease and other diseases of the circulatory system: Secondary | ICD-10-CM

## 2016-12-15 DIAGNOSIS — Z841 Family history of disorders of kidney and ureter: Secondary | ICD-10-CM

## 2016-12-15 DIAGNOSIS — Z79899 Other long term (current) drug therapy: Secondary | ICD-10-CM | POA: Insufficient documentation

## 2016-12-15 DIAGNOSIS — R197 Diarrhea, unspecified: Secondary | ICD-10-CM

## 2016-12-15 DIAGNOSIS — Z955 Presence of coronary angioplasty implant and graft: Secondary | ICD-10-CM | POA: Diagnosis not present

## 2016-12-15 DIAGNOSIS — I120 Hypertensive chronic kidney disease with stage 5 chronic kidney disease or end stage renal disease: Secondary | ICD-10-CM | POA: Diagnosis not present

## 2016-12-15 DIAGNOSIS — R112 Nausea with vomiting, unspecified: Secondary | ICD-10-CM

## 2016-12-15 DIAGNOSIS — D696 Thrombocytopenia, unspecified: Secondary | ICD-10-CM

## 2016-12-15 DIAGNOSIS — R062 Wheezing: Secondary | ICD-10-CM

## 2016-12-15 DIAGNOSIS — N186 End stage renal disease: Secondary | ICD-10-CM

## 2016-12-15 DIAGNOSIS — R05 Cough: Secondary | ICD-10-CM

## 2016-12-15 DIAGNOSIS — N189 Chronic kidney disease, unspecified: Secondary | ICD-10-CM

## 2016-12-15 DIAGNOSIS — Z813 Family history of other psychoactive substance abuse and dependence: Secondary | ICD-10-CM

## 2016-12-15 DIAGNOSIS — Z992 Dependence on renal dialysis: Secondary | ICD-10-CM | POA: Diagnosis not present

## 2016-12-15 DIAGNOSIS — R531 Weakness: Secondary | ICD-10-CM

## 2016-12-15 DIAGNOSIS — D631 Anemia in chronic kidney disease: Secondary | ICD-10-CM

## 2016-12-15 DIAGNOSIS — D649 Anemia, unspecified: Secondary | ICD-10-CM | POA: Diagnosis present

## 2016-12-15 DIAGNOSIS — E875 Hyperkalemia: Secondary | ICD-10-CM | POA: Diagnosis not present

## 2016-12-15 DIAGNOSIS — R109 Unspecified abdominal pain: Secondary | ICD-10-CM | POA: Diagnosis present

## 2016-12-15 DIAGNOSIS — R1084 Generalized abdominal pain: Secondary | ICD-10-CM | POA: Diagnosis not present

## 2016-12-15 DIAGNOSIS — R0602 Shortness of breath: Secondary | ICD-10-CM | POA: Insufficient documentation

## 2016-12-15 DIAGNOSIS — I10 Essential (primary) hypertension: Secondary | ICD-10-CM | POA: Diagnosis present

## 2016-12-15 LAB — I-STAT CHEM 8, ED
BUN: 50 mg/dL — AB (ref 6–20)
CHLORIDE: 99 mmol/L — AB (ref 101–111)
Calcium, Ion: 0.86 mmol/L — CL (ref 1.15–1.40)
Creatinine, Ser: >18 mg/dL — ABNORMAL HIGH (ref 0.61–1.24)
Glucose, Bld: 86 mg/dL (ref 65–99)
HCT: 29 % — ABNORMAL LOW (ref 39.0–52.0)
Hemoglobin: 9.9 g/dL — ABNORMAL LOW (ref 13.0–17.0)
Potassium: 5.7 mmol/L — ABNORMAL HIGH (ref 3.5–5.1)
SODIUM: 137 mmol/L (ref 135–145)
TCO2: 27 mmol/L (ref 0–100)

## 2016-12-15 LAB — CBC WITH DIFFERENTIAL/PLATELET
BASOS PCT: 0 %
Basophils Absolute: 0 10*3/uL (ref 0.0–0.1)
EOS ABS: 0.1 10*3/uL (ref 0.0–0.7)
Eosinophils Relative: 3 %
HCT: 30.9 % — ABNORMAL LOW (ref 39.0–52.0)
HEMOGLOBIN: 9.6 g/dL — AB (ref 13.0–17.0)
LYMPHS ABS: 1.9 10*3/uL (ref 0.7–4.0)
Lymphocytes Relative: 40 %
MCH: 29.4 pg (ref 26.0–34.0)
MCHC: 31.1 g/dL (ref 30.0–36.0)
MCV: 94.8 fL (ref 78.0–100.0)
Monocytes Absolute: 0.5 10*3/uL (ref 0.1–1.0)
Monocytes Relative: 10 %
NEUTROS ABS: 2.3 10*3/uL (ref 1.7–7.7)
NEUTROS PCT: 48 %
Platelets: 103 10*3/uL — ABNORMAL LOW (ref 150–400)
RBC: 3.26 MIL/uL — AB (ref 4.22–5.81)
RDW: 15 % (ref 11.5–15.5)
WBC: 4.7 10*3/uL (ref 4.0–10.5)

## 2016-12-15 LAB — CBG MONITORING, ED: Glucose-Capillary: 87 mg/dL (ref 65–99)

## 2016-12-15 LAB — I-STAT TROPONIN, ED: Troponin i, poc: 0.09 ng/mL (ref 0.00–0.08)

## 2016-12-15 LAB — BRAIN NATRIURETIC PEPTIDE: B NATRIURETIC PEPTIDE 5: 894 pg/mL — AB (ref 0.0–100.0)

## 2016-12-15 LAB — I-STAT CG4 LACTIC ACID, ED: LACTIC ACID, VENOUS: 0.61 mmol/L (ref 0.5–1.9)

## 2016-12-15 MED ORDER — HYDRALAZINE HCL 50 MG PO TABS
50.0000 mg | ORAL_TABLET | Freq: Three times a day (TID) | ORAL | Status: DC
  Administered 2016-12-16: 50 mg via ORAL
  Filled 2016-12-15 (×2): qty 1

## 2016-12-15 MED ORDER — INSULIN ASPART 100 UNIT/ML IV SOLN
5.0000 [IU] | Freq: Once | INTRAVENOUS | Status: DC
  Filled 2016-12-15: qty 0.05

## 2016-12-15 MED ORDER — INSULIN ASPART 100 UNIT/ML ~~LOC~~ SOLN
5.0000 [IU] | Freq: Once | SUBCUTANEOUS | Status: AC
  Administered 2016-12-15: 5 [IU] via INTRAVENOUS
  Filled 2016-12-15: qty 1

## 2016-12-15 MED ORDER — ATORVASTATIN CALCIUM 20 MG PO TABS: 20.0000 mg | ORAL_TABLET | Freq: Every day | ORAL | Status: DC

## 2016-12-15 MED ORDER — HEPARIN SODIUM (PORCINE) 5000 UNIT/ML IJ SOLN: 5000.0000 [IU] | Freq: Three times a day (TID) | INTRAMUSCULAR | Status: DC

## 2016-12-15 MED ORDER — AMLODIPINE BESYLATE 10 MG PO TABS
10.0000 mg | ORAL_TABLET | Freq: Every day | ORAL | Status: DC
  Administered 2016-12-16: 10 mg via ORAL
  Filled 2016-12-15: qty 1

## 2016-12-15 MED ORDER — SODIUM POLYSTYRENE SULFONATE 15 GM/60ML PO SUSP: 30.0000 g | Freq: Once | ORAL | Status: DC

## 2016-12-15 MED ORDER — DEXTROSE 50 % IV SOLN
1.0000 | Freq: Once | INTRAVENOUS | Status: AC
  Administered 2016-12-15: 50 mL via INTRAVENOUS
  Filled 2016-12-15: qty 50

## 2016-12-15 MED ORDER — SODIUM POLYSTYRENE SULFONATE 15 GM/60ML PO SUSP
30.0000 g | Freq: Once | ORAL | Status: AC
  Administered 2016-12-15: 30 g via ORAL
  Filled 2016-12-15: qty 120

## 2016-12-15 MED ORDER — ASPIRIN EC 81 MG PO TBEC
81.0000 mg | DELAYED_RELEASE_TABLET | Freq: Every day | ORAL | Status: DC
  Administered 2016-12-16: 81 mg via ORAL
  Filled 2016-12-15 (×2): qty 1

## 2016-12-15 MED ORDER — DM-GUAIFENESIN ER 30-600 MG PO TB12
1.0000 | ORAL_TABLET | Freq: Two times a day (BID) | ORAL | Status: DC
  Administered 2016-12-16 (×2): 1 via ORAL
  Filled 2016-12-15 (×2): qty 1

## 2016-12-15 MED ORDER — CARVEDILOL 25 MG PO TABS
25.0000 mg | ORAL_TABLET | Freq: Two times a day (BID) | ORAL | Status: DC
  Administered 2016-12-16: 25 mg via ORAL
  Filled 2016-12-15: qty 1

## 2016-12-15 MED ORDER — ALBUTEROL SULFATE (2.5 MG/3ML) 0.083% IN NEBU: 2.5000 mg | INHALATION_SOLUTION | RESPIRATORY_TRACT | Status: DC | PRN

## 2016-12-15 MED ORDER — ALBUTEROL SULFATE (2.5 MG/3ML) 0.083% IN NEBU
5.0000 mg | INHALATION_SOLUTION | Freq: Once | RESPIRATORY_TRACT | Status: AC
  Administered 2016-12-15: 5 mg via RESPIRATORY_TRACT
  Filled 2016-12-15: qty 6

## 2016-12-15 MED ORDER — SODIUM CHLORIDE 0.9 % IV SOLN
1.0000 g | Freq: Once | INTRAVENOUS | Status: AC
  Administered 2016-12-15: 1 g via INTRAVENOUS
  Filled 2016-12-15: qty 10

## 2016-12-15 NOTE — H&P (Signed)
Date: 12/15/2016               Patient Name:  Jon [name] MRN: [id number]  DOB: July 09, 1956 Age / Sex: 61 y.o., male   PCP: Shela Leff, MD         Medical Service: Internal Medicine Teaching Service         Attending Physician: Dr. Annia Belt, MD    First Contact: Dr. Inda Castle  Pager: [phone]  Second Contact: Dr. Benjamine Mola Pager: [phone]       After Hours (After 5p/  First Contact Pager: [phone]  weekends / holidays): Second Contact Pager: [phone]   Chief Complaint: N/V, weakness, cough, missed HD  History of Present Illness: Patient is a 61 year old male with a past medical history of hypertension and ESRD on dialysis (M/W/F) who is coming in with multiple complaints after missing dialysis yesterday. Patient reports he was in [place] (left last Friday) and received his last dialysis on Wednesday while in [place]. The morning prior to his dialysis session on wednesday, he developed 2-3 episodes of diarrhea. The next day he developed nausea, vomiting, weakness, and subjective fevers. He left [place] Friday and [place] the [place]. He reports feeling so weak that he had to use a wheelchair to get to the airport. He missed his Friday dialysis session due to his travel arrangements. His diarrhea and emesis have now resolved (last BM this morning was normal, tolerating PO in ED) but he has now developed productive cough, shortness of breath, and wheezing. He denies any sick contacts. He denies eating any unusual foods. EMS was called and he received 5 mg albuterol en route to the ED.   In the ED, patient was hypertensive with blood pressure 160/96, heart rate 84, respiratory rate 22, temperature 98.1, and oxygen 100% on room air. Labs were significant for a BMP with potassium 5.7, BUN of 50, creatinine >18. CBC with a hemoglobin of 9.6, down from 12.7 six months ago. BNP was elevated at 894. Troponin was elevated 0.09. EKG was sinus rhythm with borderline prolonged QT, but  no ischemic changes. Venous lactic acid was 0.61. Chest x-ray was negative with no acute abnormality. Patient received albuterol, kayexalate, 10 units of novolog with D50, and calcium gluconate in the ED.   Meds:  Current Meds  Medication Sig  . amLODipine (NORVASC) 10 MG tablet Take 10 mg by mouth at bedtime.  Marland Kitchen aspirin EC 81 MG EC tablet Take 1 tablet (81 mg total) by mouth daily.  Marland Kitchen atorvastatin (LIPITOR) 20 MG tablet Take 1 tablet (20 mg total) by mouth daily at 6 PM.  . carvedilol (COREG) 25 MG tablet Take 25 mg by mouth 2 (two) times daily.  . hydrALAZINE (APRESOLINE) 50 MG tablet Take 50 mg by mouth 3 (three) times daily.  Marland Kitchen losartan (COZAAR) 100 MG tablet Take 1 tablet (100 mg total) by mouth at bedtime.  Marland Kitchen spironolactone (ALDACTONE) 25 MG tablet Take 25 mg by mouth daily.     Allergies: Allergies as of 12/15/2016  . (No Known Allergies)   Past Medical History:  Diagnosis Date  . Chronic kidney disease   . Dialysis patient ([facility])   . GERD (gastroesophageal reflux disease)   . History of blood transfusion   . Hyperlipidemia    takes Atorvastatin daily  . Hypertension    takes Amlodipine,Losartan,and Labetalol daily  . Myocardial infarction [facility])    lived in [place] maybe 7 years ago  . Small bowel obstruction (  [place])     Family History: Hypertension, brother deceased (ESRD, drug use)  Social History: Lives in [place]. 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 He denies tobacco, alcohol, and illicit drug use.  Review of Systems: A complete ROS was negative except as per HPI.   Physical Exam: Blood pressure (!) 151/81, pulse 83, temperature 98.1 F (36.7 C), temperature source Oral, resp. rate 14, SpO2 98 %. Constitutional: NAD, appears comfortable HEENT: Atraumatic, normocephalic. PERRL, anicteric sclera.  Neck: Supple, trachea midline.  Cardiovascular: RRR, no murmurs, rubs, or gallops.  Pulmonary/Chest:  Mild expiratory wheezing bilaterally in all lung fields  Abdominal: Soft, mildly tender to palpation in LLQ, non distended. +BS.  Extremities: Warm and well perfused. No edema. Left upper extremity fistula with palpable thrill  Neurological: A&Ox3, CN II - XII grossly intact.  Skin: No rashes or erythema  Psychiatric: Normal mood and affect  EKG: Personally reviewed. Sinus rhythm. Borderline prolonged QT. No ischemic changes.   CXR: Personally reviewed. Agree with report. Negative chest x-ray.  Assessment & Plan by Problem:  Patient is a 61 year old male with a past medical history of hypertension and ESRD on HD (M/W/F) who presents with multiple complaints of N/V/D, weakness, cough, and SOB that started during a recent trip to [place].   ESRD on HD: Patient missed his dialysis session yesterday due to his travel arrangements back [place] the states. He last received dialysis on Wednesday while in [place]. He denies any issues with his HD sessions in [place]. Patient is hypertensive here with BP 160/96, potassium is 5.7. Dry weight is unknown but he appears euvolemic on exam. No lower extremity edema. CXR is negative for pulm edema and he is breathing comfortably on room air.  -- Nephro consulted per EDP -- Plan for HD tomorrow   Hyperkalemia: In the setting of missed HD. S/p 10 units of insulin with D50, kayexalate, albuterol, and calcium gluconate in the ED. Patient denies chest pain and palpitations. -- Tele monitoring  -- Repeat AM BMP   HTN: Hypertensive on arrival, 160/96. Patient is prescribed Coreg, amlodipine, hydralazine, Losartan, and spironolactone as an outpatient. He has his medications with him and reports compliance. He reports he still makes some urine.  -- Hold spironolactone and losartan for now  -- Continue Hydralazine 50 mg TID -- Continue amlodipine 10 mg -- Continue Coreg 25 mg BID  Cough & Wheezing: Patient is a never smoker. Reports productive cough of white sputum  and subjective fevers x 2 days. CXR is negative for consolidation or infiltrate. He is afebrile here without a leukocytosis. Likely viral in etiology.  -- Albuterol q4 prn -- Mucinex BID  N/V/D: Seems to be resolving, likely a viral gastroenteritis. Patient is endorsing some mild residual abdominal pain. Exam is reassuring and last BM this morning was normal. He is tolerating PO in the ED. -- PO as tolerated -- Supportive care for now -- Checking HIV ab   FEN: No fluids, tx hyperkalemia above, renal diet with fluid restriction  VTE ppx: Heparin  Code Status: FULL   Dispo: Admit patient to Observation with expected length of stay less than 2 midnights.  SignedVelna Ochs, MD 12/15/2016, 11:26 PM  Pager: [phone]

## 2016-12-15 NOTE — ED Provider Notes (Signed)
[facility] DEPT Provider Note   CSN: [id number] Arrival date & time: 12/15/16  2007     History   Chief Complaint Chief Complaint  Patient presents with  . Shortness of Breath  . Abdominal Pain    HPI Jon [name] is a 61 y.o. male.  The history is provided by the patient.  Patient is a 61 year old male who presents with generalized fatigue, weakness, nausea, vomiting since Thursday. He was recently in [place] for a funeral for a week and return last night. He does do dialysis Monday Wednesday Friday and missed his Friday session. He reports a nonproductive cough since yesterday as well as abdominal pain when he coughs. He does report some mild shortness of breath. He reports subjective fevers. He makes some urine daily and denies diarrhea   Past Medical History:  Diagnosis Date  . Chronic kidney disease   . Dialysis patient ([place])   . GERD (gastroesophageal reflux disease)   . History of blood transfusion   . Hyperlipidemia    takes Atorvastatin daily  . Hypertension    takes Amlodipine,Losartan,and Labetalol daily  . Myocardial infarction [facility])    lived in [place] maybe 7 years ago  . Small bowel obstruction [facility])     Patient Active Problem List   Diagnosis Date Noted  . Generalized weakness 12/15/2016  . Preventative health care 10/16/2016  . Right ankle pain 09/07/2016  . Chronic anemia 05/01/2015  . ESRD (end stage renal disease) on dialysis ([place]) 05/07/2012  . HTN (hypertension) 05/07/2012    Past Surgical History:  Procedure Laterality Date  . ARTERIOVENOUS GRAFT PLACEMENT    . AV FISTULA PLACEMENT Right 10/25/2015   Procedure: ARTERIOVENOUS (AV) FISTULA CREATION RIGHT FOREARM;  Surgeon: Angelia Mould, MD;  Location: [place];  Service: Vascular;  Laterality: Right;  . CARDIAC CATHETERIZATION N/A 05/05/2015   Procedure: Left Heart Cath and Coronary Angiography;  Surgeon: Charolette Forward, MD;  Location: [place] CV LAB;  Service: Cardiovascular;   Laterality: N/A;  . PERIPHERAL VASCULAR CATHETERIZATION N/A 08/18/2015   Procedure: Fistulagram;  Surgeon: Conrad Dade, MD;  Location: [place] CV LAB;  Service: Cardiovascular;  Laterality: N/A;  . PERIPHERAL VASCULAR CATHETERIZATION Right 04/19/2016   Procedure: Fistulagram;  Surgeon: Conrad Bauxite, MD;  Location: [place] CV LAB;  Service: Cardiovascular;  Laterality: Right;  . RESECTION OF ARTERIOVENOUS FISTULA ANEURYSM Left 02/05/2013   Procedure: REPAIR OF ANEURYSM OF LEFT ARM ARTERIOVENOUS FISTULA ;  Surgeon: Serafina Mitchell, MD;  Location: [place];  Service: Vascular;  Laterality: Left;  . RESECTION OF ARTERIOVENOUS FISTULA ANEURYSM Left 03/08/2015   Procedure: REPAIR OF LEFT ARTERIOVENOUS FISTULA PSEUDOANEURYSM;  Surgeon: Angelia Mould, MD;  Location: [place];  Service: Vascular;  Laterality: Left;  . REVISON OF ARTERIOVENOUS FISTULA Left 22/0/2542   Procedure: PLICATION OF A LARGE ANEURYSM LEFT UPPER ARM BRACHIO-CEPHALIC ARTERIOVENOUS FISTULA ;  Surgeon: Angelia Mould, MD;  Location: [place];  Service: Vascular;  Laterality: Left;  . SMALL INTESTINE SURGERY         Home Medications    Prior to Admission medications   Medication Sig Start Date End Date Taking? Authorizing Provider  amLODipine (NORVASC) 10 MG tablet Take 10 mg by mouth at bedtime.   Yes [provider]  aspirin EC 81 MG EC tablet Take 1 tablet (81 mg total) by mouth daily. 05/30/13  Yes Charolette Forward, MD  atorvastatin (LIPITOR) 20 MG tablet Take 1 tablet (20 mg total) by mouth daily  at 6 PM. 05/30/13  Yes Charolette Forward, MD  carvedilol (COREG) 25 MG tablet Take 25 mg by mouth 2 (two) times daily.   Yes [provider]  hydrALAZINE (APRESOLINE) 50 MG tablet Take 50 mg by mouth 3 (three) times daily.   Yes [provider]  losartan (COZAAR) 100 MG tablet Take 1 tablet (100 mg total) by mouth at bedtime. 05/06/15  Yes Hongalgi, Lenis Dickinson, MD  spironolactone (ALDACTONE) 25 MG tablet  Take 25 mg by mouth daily.   Yes [provider]    Family History Family History  Problem Relation Age of Onset  . Hypertension Mother   . Other Mother        varicose veins  . Diabetes Father   . Hypertension Father   . Hypertension Sister   . Other Sister        varicose veins  . Other Brother        varicose veins    Social History Social History  Substance Use Topics  . Smoking status: Never Smoker  . Smokeless tobacco: Never Used  . Alcohol use No     Allergies   Patient has no known allergies.   Review of Systems Review of Systems  Constitutional: Positive for chills.  HENT: Positive for congestion.   Respiratory: Positive for cough and shortness of breath.   Cardiovascular: Negative for chest pain.  Gastrointestinal: Positive for abdominal pain, nausea and vomiting. Negative for diarrhea.  Musculoskeletal: Positive for myalgias.  Skin: Negative.   Neurological: Negative.      Physical Exam Updated Vital Signs BP (!) 170/92   Pulse 81   Temp 98.1 F (36.7 C) (Oral)   Resp 18   SpO2 95%   Physical Exam  Constitutional: He is oriented to person, place, and time. He appears well-developed and well-nourished.  HENT:  Head: Normocephalic and atraumatic.  Eyes: Conjunctivae are normal. Pupils are equal, round, and reactive to light.  Neck: Normal range of motion. Neck supple.  Cardiovascular: Normal rate, regular rhythm, normal heart sounds and intact distal pulses.   No murmur heard. Pulmonary/Chest: Effort normal. No respiratory distress.  Coarse breath sound throughout  Abdominal: Soft. There is no tenderness. There is no rebound and no guarding.  Musculoskeletal: He exhibits no edema or tenderness.  Neurological: He is alert and oriented to person, place, and time. He exhibits normal muscle tone. Coordination normal.  Skin: Skin is warm and dry.  Psychiatric: He has a normal mood and affect.  Nursing note and vitals reviewed.    ED  Treatments / Results  Labs (all labs ordered are listed, but only abnormal results are displayed) Labs Reviewed  CBC WITH DIFFERENTIAL/PLATELET - Abnormal; Notable for the following:       Result Value   RBC 3.26 (*)    Hemoglobin 9.6 (*)    HCT 30.9 (*)    Platelets 103 (*)    All other components within normal limits  BRAIN NATRIURETIC PEPTIDE - Abnormal; Notable for the following:    B Natriuretic Peptide 894.0 (*)    All other components within normal limits  I-STAT CHEM 8, ED - Abnormal; Notable for the following:    Potassium 5.7 (*)    Chloride 99 (*)    BUN 50 (*)    Creatinine, Ser >18.00 (*)    Calcium, Ion 0.86 (*)    Hemoglobin 9.9 (*)    HCT 29.0 (*)    All other components within normal limits  I-STAT TROPOININ, ED - Abnormal; Notable for the following:    Troponin i, poc 0.09 (*)    All other components within normal limits  HIV ANTIBODY (ROUTINE TESTING)  BASIC METABOLIC PANEL  CBC  I-STAT CG4 LACTIC ACID, ED  CBG MONITORING, ED  I-STAT CG4 LACTIC ACID, ED    EKG  EKG Interpretation None       Radiology Dg Chest 2 View  Result Date: 12/15/2016 CLINICAL DATA:  Weakness and shortness of Breath EXAM: CHEST  2 VIEW COMPARISON:  06/01/2016 FINDINGS: Cardiac shadow is at the upper limits of normal in size. The lungs are well aerated bilaterally. No focal infiltrate or sizable effusion is seen. No bony abnormality is noted. IMPRESSION: No acute abnormality noted. Electronically Signed   By: Inez Catalina M.D.   On: 12/15/2016 20:54    Procedures Procedures (including critical care time)  Medications Ordered in ED Medications  carvedilol (COREG) tablet 25 mg (not administered)  amLODipine (NORVASC) tablet 10 mg (not administered)  aspirin EC tablet 81 mg (not administered)  atorvastatin (LIPITOR) tablet 20 mg (not administered)  heparin injection 5,000 Units (not administered)  hydrALAZINE (APRESOLINE) tablet 50 mg (not administered)    dextromethorphan-guaiFENesin (MUCINEX DM) 30-600 MG per 12 hr tablet 1 tablet (not administered)  albuterol (PROVENTIL) (2.5 MG/3ML) 0.083% nebulizer solution 2.5 mg (not administered)  albuterol (PROVENTIL) (2.5 MG/3ML) 0.083% nebulizer solution 5 mg (5 mg Nebulization Given 12/15/16 2102)  sodium polystyrene (KAYEXALATE) 15 GM/60ML suspension 30 g (30 g Oral Given 12/15/16 2244)  calcium gluconate 1 g in sodium chloride 0.9 % 100 mL IVPB (0 g Intravenous Stopped 12/15/16 2326)  dextrose 50 % solution 50 mL (50 mLs Intravenous Given 12/15/16 2245)  insulin aspart (novoLOG) injection 5 Units (5 Units Intravenous Given 12/15/16 2244)     Initial Impression / Assessment and Plan / ED Course  I have reviewed the triage vital signs and the nursing notes.  Pertinent labs & imaging results that were available during my care of the patient were reviewed by me and considered in my medical decision making (see chart for details).      Patient is a 61 year old male end-stage renal disease he does dialysis Monday, Wednesday, Friday who missed his dialysis session on Friday due to traveling who presents with diffuse body fatigue as well as cough, nausea, vomiting, subjective fevers, abdominal pain with his cough. Vital signs unremarkable and exam with coarse breath sounds but otherwise reassuring with no abdominal pain and no obvious signs of volume overload. EKG with borderline prolonged QTC but otherwise no acute ischemic changes. Labs obtained with elevated BNP as well as hyperkalemia and elevated BUN and creatinine from his baseline. Chest x-ray without obvious pulmonary abnormalities. I discussed findings with nephrology and patient will be started on Kayexalate in temporizing measurements for his hyperkalemia and will likely need dialysis tomorrow given that he will be unable to get his scheduled dialysis until Monday at the earliest. He will be admitted to medicine for further management and  observation.  Final Clinical Impressions(s) / ED Diagnoses   Final diagnoses:  Hyperkalemia  ESRD needing dialysis [facility])    New Prescriptions New Prescriptions   No medications on file     Heriberto Antigua, MD 12/16/16 Ofilia Neas    Merrily Pew, MD 12/17/16 0003

## 2016-12-15 NOTE — ED Triage Notes (Signed)
Per EMS: Pt complaining of generalized weakness and SOB. Pt states returned from [place] yesterday. Missed dialysis Friday. Normally MWF. Pt received 5mg  albuterol from EMS. Pt also complaining of lower abdominal pain x 3 days.

## 2016-12-16 DIAGNOSIS — D631 Anemia in chronic kidney disease: Secondary | ICD-10-CM | POA: Diagnosis not present

## 2016-12-16 DIAGNOSIS — E875 Hyperkalemia: Secondary | ICD-10-CM

## 2016-12-16 DIAGNOSIS — R531 Weakness: Secondary | ICD-10-CM | POA: Diagnosis not present

## 2016-12-16 DIAGNOSIS — D696 Thrombocytopenia, unspecified: Secondary | ICD-10-CM

## 2016-12-16 DIAGNOSIS — N186 End stage renal disease: Secondary | ICD-10-CM | POA: Diagnosis not present

## 2016-12-16 DIAGNOSIS — N189 Chronic kidney disease, unspecified: Secondary | ICD-10-CM

## 2016-12-16 DIAGNOSIS — Z992 Dependence on renal dialysis: Secondary | ICD-10-CM | POA: Diagnosis not present

## 2016-12-16 DIAGNOSIS — I120 Hypertensive chronic kidney disease with stage 5 chronic kidney disease or end stage renal disease: Secondary | ICD-10-CM | POA: Diagnosis not present

## 2016-12-16 LAB — BASIC METABOLIC PANEL
Anion gap: 15 (ref 5–15)
BUN: 54 mg/dL — ABNORMAL HIGH (ref 6–20)
CALCIUM: 7.2 mg/dL — AB (ref 8.9–10.3)
CHLORIDE: 98 mmol/L — AB (ref 101–111)
CO2: 26 mmol/L (ref 22–32)
Creatinine, Ser: 23.53 mg/dL — ABNORMAL HIGH (ref 0.61–1.24)
GFR calc non Af Amer: 2 mL/min — ABNORMAL LOW (ref >60)
GFR, EST AFRICAN AMERICAN: 2 mL/min — AB (ref >60)
Glucose, Bld: 83 mg/dL (ref 65–99)
Potassium: 4.5 mmol/L (ref 3.5–5.1)
Sodium: 139 mmol/L (ref 135–145)

## 2016-12-16 LAB — CBC
HCT: 30.4 % — ABNORMAL LOW (ref 39.0–52.0)
HEMOGLOBIN: 9.5 g/dL — AB (ref 13.0–17.0)
MCH: 29.7 pg (ref 26.0–34.0)
MCHC: 31.3 g/dL (ref 30.0–36.0)
MCV: 95 fL (ref 78.0–100.0)
Platelets: 89 10*3/uL — ABNORMAL LOW (ref 150–400)
RBC: 3.2 MIL/uL — ABNORMAL LOW (ref 4.22–5.81)
RDW: 15.2 % (ref 11.5–15.5)
WBC: 4.9 10*3/uL (ref 4.0–10.5)

## 2016-12-16 LAB — HEPATIC FUNCTION PANEL
ALT: 11 U/L — ABNORMAL LOW (ref 17–63)
AST: 17 U/L (ref 15–41)
Albumin: 3.2 g/dL — ABNORMAL LOW (ref 3.5–5.0)
Alkaline Phosphatase: 45 U/L (ref 38–126)
Total Bilirubin: 0.8 mg/dL (ref 0.3–1.2)
Total Protein: 6.3 g/dL — ABNORMAL LOW (ref 6.5–8.1)

## 2016-12-16 LAB — MRSA PCR SCREENING: MRSA by PCR: NEGATIVE

## 2016-12-16 LAB — HIV ANTIBODY (ROUTINE TESTING W REFLEX): HIV Screen 4th Generation wRfx: NONREACTIVE

## 2016-12-16 MED ORDER — CALCIUM CARBONATE ANTACID 500 MG PO CHEW: 400.0000 mg | CHEWABLE_TABLET | Freq: Every day | ORAL | Status: DC

## 2016-12-16 NOTE — Progress Notes (Signed)
   Subjective: He is feeling partially better today with 2 more episodes of diarrhea overnight. He has an intermittent cough that is nonproductive. He denies significant abdominal pain or vomiting. He is drinking water and coffee so far this morning. His mood is down but he just returned from [place] for 2 deaths in the family.  Objective:  Vital signs in last 24 hours: Vitals:   12/16/16 0023 12/16/16 0548 12/16/16 0818 12/16/16 1022  BP: (!) 162/91 (!) 143/77  (!) 155/81  Pulse: 80 84  82  Resp: 16 17  16   Temp: 98.6 F (37 C) 98.5 F (36.9 C)  98.3 F (36.8 C)  TempSrc: Oral Axillary  Oral  SpO2: 99% 98%  99%  Weight: 241 lb 6.5 oz (109.5 kg)     Height:   6' (1.829 m)    GENERAL- alert, co-operative, NAD HEENT- Oral mucosa appears moist, good and intact dentition CARDIAC- RRR, no murmurs, rubs or gallops RESP- CTAB, normal WOB ABDOMEN- Soft, nontender, no guarding or rebound EXTREMITIES- symmetric, no pedal edema. SKIN- Warm, dry, No rash or lesion PSYCH- Somewhat flat affect   Assessment/Plan: Patient is a 61 year old male with a past medical history of hypertension and ESRD on HD (M/W/F) who presents with multiple complaints of N/V/D, weakness, cough, and SOB that started during a recent trip to [place].   #ESRD on HD MWF Last HD on Wednesday while in [place] to visit family for funeral. Medical treatment for hyperkalemia with some improvement overnight. No telemetry abnormalities. He makes some urine but has been on HD a while so I suspect little residual function.He is past his regular schedule but labs are not critical and last HD was Wednesday. He is on MWF HD outpatient, I will discuss HD as outpatient versus needing this prior to discharge.  #N/V/D This is continuing to improve with supportive care. He is tolerating oral intake with slowing of bowel movements and never reported bloody stools. Likely this is a self limited enteritis that is improving. He is not  severely uremic at this time.  #HTN Hypertensive on arrival, 160/96. He takes Coreg, amlodipine, hydralazine, Losartan, and spironolactone PTA. Mild hypertension but he is also between HD sessions. We will hold spironolactone and losartan for now with his presenting hyperkalemia.  #Cough & Wheezing Cough is improved without much wheezing on exam today. No fevers, chills, or sputum production at this time.  Dispo: Anticipated discharge today or tomorrow   Collier Salina, MD PGY-II Internal Medicine Resident Pager# [phone] 12/16/2016, 11:45 AM

## 2016-12-16 NOTE — Consult Note (Signed)
[facility] Renal Consultation Note  Indication for Consultation:  Management of ESRD/hemodialysis; anemia, hypertension/volume and secondary hyperparathyroidism  HPI: Jon [name] is a 61 y.o. male with ESRD Chronic HD MWF(east unit) admitted to OBSERVATION  With weakness , Hyperkalemia , with Scr 23.5 Bun54, initially K 5.7 ,and  Hgb 9.6 =having missed last HD on Friday 2/2  Being in [place] (for Brothers' funeral) ) and received his last dialysis on Wednesday 16th  while in [place]. He reported prior to his dialysis session on wednesday,  2-3 episodes of diarrhea Thursday again  nausea, vomiting, weakness, and subjective fevers. He left [place] Friday ( missed HD Friday 18th) and [place] and noted  feeling so weak that he had to use wheelchair .     He came to ER  With chest x-ray = Negative for acute abnormality/ for Hyperkalemia received albuterol, kayexalate, 10 units of novolog with D50, and calcium gluconate in the ED. k this am 4.5. This am no sob, ate all breakfast / standing up  in Room when I entered .    Past Medical History:  Diagnosis Date  . Chronic kidney disease   . Dialysis patient ([place])   . GERD (gastroesophageal reflux disease)   . History of blood transfusion   . Hyperlipidemia    takes Atorvastatin daily  . Hypertension    takes Amlodipine,Losartan,and Labetalol daily  . Myocardial infarction [facility])    lived in [place] maybe 7 years ago  . Small bowel obstruction [facility])     Past Surgical History:  Procedure Laterality Date  . ARTERIOVENOUS GRAFT PLACEMENT    . AV FISTULA PLACEMENT Right 10/25/2015   Procedure: ARTERIOVENOUS (AV) FISTULA CREATION RIGHT FOREARM;  Surgeon: Angelia Mould, MD;  Location: [place];  Service: Vascular;  Laterality: Right;  . CARDIAC CATHETERIZATION N/A 05/05/2015   Procedure: Left Heart Cath and Coronary Angiography;  Surgeon: Charolette Forward, MD;  Location: [place] CV LAB;  Service: Cardiovascular;   Laterality: N/A;  . PERIPHERAL VASCULAR CATHETERIZATION N/A 08/18/2015   Procedure: Fistulagram;  Surgeon: Conrad Tulare, MD;  Location: [place] CV LAB;  Service: Cardiovascular;  Laterality: N/A;  . PERIPHERAL VASCULAR CATHETERIZATION Right 04/19/2016   Procedure: Fistulagram;  Surgeon: Conrad Salisbury, MD;  Location: [place] [facility] LAB;  Service: Cardiovascular;  Laterality: Right;  . RESECTION OF ARTERIOVENOUS FISTULA ANEURYSM Left 02/05/2013   Procedure: REPAIR OF ANEURYSM OF LEFT ARM ARTERIOVENOUS FISTULA ;  Surgeon: Serafina Mitchell, MD;  Location: [place];  Service: Vascular;  Laterality: Left;  . RESECTION OF ARTERIOVENOUS FISTULA ANEURYSM Left 03/08/2015   Procedure: REPAIR OF LEFT ARTERIOVENOUS FISTULA PSEUDOANEURYSM;  Surgeon: Angelia Mould, MD;  Location: [place];  Service: Vascular;  Laterality: Left;  . REVISON OF ARTERIOVENOUS FISTULA Left 91/12/3844   Procedure: PLICATION OF A LARGE ANEURYSM LEFT UPPER ARM BRACHIO-CEPHALIC ARTERIOVENOUS FISTULA ;  Surgeon: Angelia Mould, MD;  Location: [place];  Service: Vascular;  Laterality: Left;  . SMALL INTESTINE SURGERY        Family History  Problem Relation Age of Onset  . Hypertension Mother   . Other Mother        varicose veins  . Diabetes Father   . Hypertension Father   . Hypertension Sister   . Other Sister        varicose veins  . Other Brother        varicose veins      reports that he  has never smoked. He has never used smokeless tobacco. He reports that he does not drink alcohol or use drugs.  No Known Allergies  Prior to Admission medications   Medication Sig Start Date End Date Taking? Authorizing Provider  amLODipine (NORVASC) 10 MG tablet Take 10 mg by mouth at bedtime.   Yes [provider]  aspirin EC 81 MG EC tablet Take 1 tablet (81 mg total) by mouth daily. 05/30/13  Yes Charolette Forward, MD  atorvastatin (LIPITOR) 20 MG tablet Take 1 tablet (20 mg total) by mouth daily at 6 PM. 05/30/13  Yes  Charolette Forward, MD  carvedilol (COREG) 25 MG tablet Take 25 mg by mouth 2 (two) times daily.   Yes [provider]  hydrALAZINE (APRESOLINE) 50 MG tablet Take 50 mg by mouth 3 (three) times daily.   Yes [provider]  losartan (COZAAR) 100 MG tablet Take 1 tablet (100 mg total) by mouth at bedtime. 05/06/15  Yes Hongalgi, Lenis Dickinson, MD  spironolactone (ALDACTONE) 25 MG tablet Take 25 mg by mouth daily.   Yes [provider]     Anti-infectives    None      Results for orders placed or performed during the hospital encounter of 12/15/16 (from the past 48 hour(s))  CBC with Differential     Status: Abnormal   Collection Time: 12/15/16  9:13 PM  Result Value Ref Range   WBC 4.7 4.0 - 10.5 K/uL   RBC 3.26 (L) 4.22 - 5.81 MIL/uL   Hemoglobin 9.6 (L) 13.0 - 17.0 g/dL   HCT 30.9 (L) 39.0 - 52.0 %   MCV 94.8 78.0 - 100.0 fL   MCH 29.4 26.0 - 34.0 pg   MCHC 31.1 30.0 - 36.0 g/dL   RDW 15.0 11.5 - 15.5 %   Platelets 103 (L) 150 - 400 K/uL    Comment: PLATELET COUNT CONFIRMED BY SMEAR   Neutrophils Relative % 48 %   Neutro Abs 2.3 1.7 - 7.7 K/uL   Lymphocytes Relative 40 %   Lymphs Abs 1.9 0.7 - 4.0 K/uL   Monocytes Relative 10 %   Monocytes Absolute 0.5 0.1 - 1.0 K/uL   Eosinophils Relative 3 %   Eosinophils Absolute 0.1 0.0 - 0.7 K/uL   Basophils Relative 0 %   Basophils Absolute 0.0 0.0 - 0.1 K/uL  Brain natriuretic peptide     Status: Abnormal   Collection Time: 12/15/16  9:13 PM  Result Value Ref Range   B Natriuretic Peptide 894.0 (H) 0.0 - 100.0 pg/mL  I-Stat Troponin, ED (not at [facility])     Status: Abnormal   Collection Time: 12/15/16  9:19 PM  Result Value Ref Range   Troponin i, poc 0.09 (HH) 0.00 - 0.08 ng/mL   Comment NOTIFIED PHYSICIAN    Comment 3            Comment: Due to the release kinetics of cTnI, a negative result within the first hours of the onset of symptoms does not rule out myocardial infarction with certainty. If myocardial  infarction is still suspected, repeat the test at appropriate intervals.   I-Stat Chem 8, ED     Status: Abnormal   Collection Time: 12/15/16  9:21 PM  Result Value Ref Range   Sodium 137 135 - 145 mmol/L   Potassium 5.7 (H) 3.5 - 5.1 mmol/L   Chloride 99 (L) 101 - 111 mmol/L   BUN 50 (H) 6 - 20 mg/dL   Creatinine,  Ser >18.00 (H) 0.61 - 1.24 mg/dL   Glucose, Bld 86 65 - 99 mg/dL   Calcium, Ion 0.86 (LL) 1.15 - 1.40 mmol/L   TCO2 27 0 - 100 mmol/L   Hemoglobin 9.9 (L) 13.0 - 17.0 g/dL   HCT 29.0 (L) 39.0 - 52.0 %  I-Stat CG4 Lactic Acid, ED     Status: None   Collection Time: 12/15/16  9:21 PM  Result Value Ref Range   Lactic Acid, Venous 0.61 0.5 - 1.9 mmol/L  CBG monitoring, ED     Status: None   Collection Time: 12/15/16 10:35 PM  Result Value Ref Range   Glucose-Capillary 87 65 - 99 mg/dL  Basic metabolic panel     Status: Abnormal   Collection Time: 12/16/16  4:35 AM  Result Value Ref Range   Sodium 139 135 - 145 mmol/L   Potassium 4.5 3.5 - 5.1 mmol/L    Comment: DELTA CHECK NOTED   Chloride 98 (L) 101 - 111 mmol/L   CO2 26 22 - 32 mmol/L   Glucose, Bld 83 65 - 99 mg/dL   BUN 54 (H) 6 - 20 mg/dL   Creatinine, Ser 23.53 (H) 0.61 - 1.24 mg/dL   Calcium 7.2 (L) 8.9 - 10.3 mg/dL   GFR calc non Af Amer 2 (L) >60 mL/min   GFR calc Af Amer 2 (L) >60 mL/min    Comment: (NOTE) The eGFR has been calculated using the CKD EPI equation. This calculation has not been validated in all clinical situations. eGFR's persistently <60 mL/min signify possible Chronic Kidney Disease.    Anion gap 15 5 - 15  CBC     Status: Abnormal   Collection Time: 12/16/16  4:35 AM  Result Value Ref Range   WBC 4.9 4.0 - 10.5 K/uL   RBC 3.20 (L) 4.22 - 5.81 MIL/uL   Hemoglobin 9.5 (L) 13.0 - 17.0 g/dL   HCT 30.4 (L) 39.0 - 52.0 %   MCV 95.0 78.0 - 100.0 fL   MCH 29.7 26.0 - 34.0 pg   MCHC 31.3 30.0 - 36.0 g/dL   RDW 15.2 11.5 - 15.5 %   Platelets 89 (L) 150 - 400 K/uL    Comment: CONSISTENT  WITH PREVIOUS RESULT  MRSA PCR Screening     Status: None   Collection Time: 12/16/16  6:08 AM  Result Value Ref Range   MRSA by PCR NEGATIVE NEGATIVE    Comment:        The GeneXpert MRSA Assay (FDA approved for NASAL specimens only), is one component of a comprehensive MRSA colonization surveillance program. It is not intended to diagnose MRSA infection nor to guide or monitor treatment for MRSA infections.   Hepatic function panel     Status: Abnormal   Collection Time: 12/16/16  6:51 AM  Result Value Ref Range   Total Protein 6.3 (L) 6.5 - 8.1 g/dL   Albumin 3.2 (L) 3.5 - 5.0 g/dL   AST 17 15 - 41 U/L   ALT 11 (L) 17 - 63 U/L   Alkaline Phosphatase 45 38 - 126 U/L   Total Bilirubin 0.8 0.3 - 1.2 mg/dL   Bilirubin, Direct <0.1 (L) 0.1 - 0.5 mg/dL   Indirect Bilirubin NOT CALCULATED 0.3 - 0.9 mg/dL     ROS: only as in hpi  Physical Exam: Vitals:   12/16/16 0548 12/16/16 1022  BP: (!) 143/77 (!) 155/81  Pulse: 84 82  Resp: 17 16  Temp: 98.5 F (  36.9 C) 98.3 F (36.8 C)     General: alert AAM , NAD  , OX3,  Appropriate  HEENT: [place] MMM, EOMI Neck: supple Heart: RRR, NO mur, rub or gallop Lungs: CTA   Non labored breathing  Abdomen: BS+ soft nT, ND Extremities: no pedal edema Skin:  No overt rash Neuro: OX4, No acute focal deficits noted / NO asterixis  Dialysis Access: pos bruit L Arm AVF   Dialysis Orders: Center: [facility] , MWF    web site down  Currently on sunday.   Assessment/Plan 1. Uremia/ Mild Hyperkalemia sec missed HD- KI improved with meds , no asterixis , no sob ,no need for acute HD today 2. ESRD -  Nl MWF schedule  3. Hypertension/volume  - bp ^  On admit sec missed  >? bp meds /hd but no excess vol on exam / States edw 106 thus  3.5 kg >edw by wt/ bp improving with med's in [place].   4. Anemia  - hgb 9.5 ,check ESA records when web site up 5. Metabolic bone disease -  Corec ca 7.8 ,  no phos lab / no binders as op listed  Give po ca  Hs and added  calcium  bath  Next hd / fu Vit d [facility]    Ernest Haber, PA-C [place] [phone] 12/16/2016, 10:56 AM   Pt seen, examined and agree w A/P as above. ESRD pt missed one HD session while [place] state for a [place].  Here the K was high but better after kayexalate.  Creat is high but no urgent indication for HD. He can have HD here tomorrow if still here.  Kelly Splinter MD [employer] pager [phone]   12/16/2016, 12:34 PM

## 2016-12-16 NOTE — Discharge Summary (Signed)
Name: Jon [name] MRN: [id number] DOB: [birth year] 61 y.o. PCP: Shela Leff, MD  Date of Admission: 12/15/2016  8:07 PM Date of Discharge: 12/16/2016 Attending Physician: Annia Belt, MD  Discharge Diagnosis: Active Problems:   ESRD (end stage renal disease) on dialysis ([place])   HTN (hypertension)   Chronic anemia   Generalized weakness   Discharge Medications: Allergies as of 12/16/2016   No Known Allergies     Medication List    STOP taking these medications   spironolactone 25 MG tablet Commonly known as:  ALDACTONE     TAKE these medications   amLODipine 10 MG tablet Commonly known as:  NORVASC Take 10 mg by mouth at bedtime.   aspirin 81 MG EC tablet Take 1 tablet (81 mg total) by mouth daily.   atorvastatin 20 MG tablet Commonly known as:  LIPITOR Take 1 tablet (20 mg total) by mouth daily at 6 PM.   carvedilol 25 MG tablet Commonly known as:  COREG Take 25 mg by mouth 2 (two) times daily.   hydrALAZINE 50 MG tablet Commonly known as:  APRESOLINE Take 50 mg by mouth 3 (three) times daily.   losartan 100 MG tablet Commonly known as:  COZAAR Take 1 tablet (100 mg total) by mouth at bedtime.       Disposition and follow-up:   Mr.Damian Crite was discharged from [facility] in Good condition.  At the hospital follow up visit please address:  1.  ESRD with hyperkalemia after missing HD: To be treated with routine HD outpatient and was due to recent travel out of the country and gastrointestinal symptoms after returning.  2.  Abdominal pain and diarrhea: He had several days of nausea, vomiting, and cough that improved spontaneously during his observation overnight. This did not seem to be related to his hyperkalemia.   Follow-up Appointments:   Hospital Course by problem list: ESRD (end stage renal disease) on dialysis [facility]) Hyperkalemia He presented to the ED on 5/19 for nausea, vomiting, and abdominal diarrhea but  was found to have hyperkalemia at 5.7 and minimal EKG changes due to missed hemodialysis on Friday 5/18. He received insulin, albuterol, and kayexalate and potassium improved to 4.5. He was feeling better without any critical lab abnormalities so discharge with plan to resume scheduled HD on Monday 5/21.  Nausea, vomiting, abdominal pain His symptoms were progressively improving by arrival to the hospital on Saturday. He had no additional diarrhea after admission. He was tolerating a regular diet by the morning on hospital day 1. This seems most consistent with a self limited enteritis that he acquired several days prior.  HTN (hypertension) Blood pressures were moderately elevated in 140s-170s. No major changes were made to antihypertensives since this was due to missing home oral medications and missed HD PTA.  Discharge Vitals:   BP (!) 155/81 (BP Location: Right Arm)   Pulse 82   Temp 98.3 F (36.8 C) (Oral)   Resp 16   Ht 6' (1.829 m)   Wt 241 lb 6.5 oz (109.5 kg)   SpO2 99%   BMI 32.74 kg/m     Discharge Instructions: Discharge Instructions    Call MD for:  persistant nausea and vomiting    Complete by:  As directed    Call MD for:  temperature >100.4    Complete by:  As directed    Diet - low sodium heart healthy    Complete by:  As directed    Discharge  instructions    Complete by:  As directed    1.  Your diarrhea and nausea were most likely caused by a self-limited infection that is improving. These are usually viral and do not need antibiotics to recover. Please call us back if you start having worsening or bloody diarrhea, or high fevers.  2.  You will need to attend your regular hemodialysis tomorrow (Monday 5/21). If for any reason you cannot go we will have to see you in the Emergency Department to treat you for high potassium that can be very dangerous.  3.  Do NOT take your losartan or spironolactone blood pressure medicines until you are back on regular dialysis  tomorrow. You should speak with your primary doctor or nephrologist about resuming these medicines after that.   Increase activity slowly    Complete by:  As directed       Signed: Collier Salina, MD PGY-II Internal Medicine Resident Pager# [phone] 12/18/2016, 7:50 AM

## 2016-12-16 NOTE — Progress Notes (Signed)
Patient Discharge: Disposition: Patient discharged to home. Education: Reviewed medications, follow-up appointments and discharge instructions, understood and acknowledged. IV: Discontinued Iv before discharge. Telemetry: N/A Transportation: Patient escorted out of the unit in w/c. Belongings: patient took all his belongings with him.

## 2016-12-17 DIAGNOSIS — N2581 Secondary hyperparathyroidism of renal origin: Secondary | ICD-10-CM | POA: Diagnosis not present

## 2016-12-17 DIAGNOSIS — D631 Anemia in chronic kidney disease: Secondary | ICD-10-CM | POA: Diagnosis not present

## 2016-12-17 DIAGNOSIS — E119 Type 2 diabetes mellitus without complications: Secondary | ICD-10-CM | POA: Diagnosis not present

## 2016-12-17 DIAGNOSIS — D509 Iron deficiency anemia, unspecified: Secondary | ICD-10-CM | POA: Diagnosis not present

## 2016-12-17 DIAGNOSIS — N186 End stage renal disease: Secondary | ICD-10-CM | POA: Diagnosis not present

## 2016-12-19 DIAGNOSIS — N2581 Secondary hyperparathyroidism of renal origin: Secondary | ICD-10-CM | POA: Diagnosis not present

## 2016-12-19 DIAGNOSIS — D509 Iron deficiency anemia, unspecified: Secondary | ICD-10-CM | POA: Diagnosis not present

## 2016-12-19 DIAGNOSIS — E119 Type 2 diabetes mellitus without complications: Secondary | ICD-10-CM | POA: Diagnosis not present

## 2016-12-19 DIAGNOSIS — D631 Anemia in chronic kidney disease: Secondary | ICD-10-CM | POA: Diagnosis not present

## 2016-12-19 DIAGNOSIS — N186 End stage renal disease: Secondary | ICD-10-CM | POA: Diagnosis not present

## 2016-12-21 DIAGNOSIS — D631 Anemia in chronic kidney disease: Secondary | ICD-10-CM | POA: Diagnosis not present

## 2016-12-21 DIAGNOSIS — N186 End stage renal disease: Secondary | ICD-10-CM | POA: Diagnosis not present

## 2016-12-21 DIAGNOSIS — D509 Iron deficiency anemia, unspecified: Secondary | ICD-10-CM | POA: Diagnosis not present

## 2016-12-21 DIAGNOSIS — E119 Type 2 diabetes mellitus without complications: Secondary | ICD-10-CM | POA: Diagnosis not present

## 2016-12-21 DIAGNOSIS — N2581 Secondary hyperparathyroidism of renal origin: Secondary | ICD-10-CM | POA: Diagnosis not present

## 2016-12-24 DIAGNOSIS — N186 End stage renal disease: Secondary | ICD-10-CM | POA: Diagnosis not present

## 2016-12-24 DIAGNOSIS — N2581 Secondary hyperparathyroidism of renal origin: Secondary | ICD-10-CM | POA: Diagnosis not present

## 2016-12-24 DIAGNOSIS — D509 Iron deficiency anemia, unspecified: Secondary | ICD-10-CM | POA: Diagnosis not present

## 2016-12-24 DIAGNOSIS — D631 Anemia in chronic kidney disease: Secondary | ICD-10-CM | POA: Diagnosis not present

## 2016-12-24 DIAGNOSIS — E119 Type 2 diabetes mellitus without complications: Secondary | ICD-10-CM | POA: Diagnosis not present

## 2016-12-26 DIAGNOSIS — N2581 Secondary hyperparathyroidism of renal origin: Secondary | ICD-10-CM | POA: Diagnosis not present

## 2016-12-26 DIAGNOSIS — D631 Anemia in chronic kidney disease: Secondary | ICD-10-CM | POA: Diagnosis not present

## 2016-12-26 DIAGNOSIS — E119 Type 2 diabetes mellitus without complications: Secondary | ICD-10-CM | POA: Diagnosis not present

## 2016-12-26 DIAGNOSIS — D509 Iron deficiency anemia, unspecified: Secondary | ICD-10-CM | POA: Diagnosis not present

## 2016-12-26 DIAGNOSIS — N186 End stage renal disease: Secondary | ICD-10-CM | POA: Diagnosis not present

## 2016-12-27 DIAGNOSIS — N186 End stage renal disease: Secondary | ICD-10-CM | POA: Diagnosis not present

## 2016-12-27 DIAGNOSIS — I120 Hypertensive chronic kidney disease with stage 5 chronic kidney disease or end stage renal disease: Secondary | ICD-10-CM | POA: Diagnosis not present

## 2016-12-27 DIAGNOSIS — Z992 Dependence on renal dialysis: Secondary | ICD-10-CM | POA: Diagnosis not present

## 2016-12-28 DIAGNOSIS — N2581 Secondary hyperparathyroidism of renal origin: Secondary | ICD-10-CM | POA: Diagnosis not present

## 2016-12-28 DIAGNOSIS — E119 Type 2 diabetes mellitus without complications: Secondary | ICD-10-CM | POA: Diagnosis not present

## 2016-12-28 DIAGNOSIS — D631 Anemia in chronic kidney disease: Secondary | ICD-10-CM | POA: Diagnosis not present

## 2016-12-28 DIAGNOSIS — D509 Iron deficiency anemia, unspecified: Secondary | ICD-10-CM | POA: Diagnosis not present

## 2016-12-28 DIAGNOSIS — N186 End stage renal disease: Secondary | ICD-10-CM | POA: Diagnosis not present

## 2016-12-31 DIAGNOSIS — D509 Iron deficiency anemia, unspecified: Secondary | ICD-10-CM | POA: Diagnosis not present

## 2016-12-31 DIAGNOSIS — N2581 Secondary hyperparathyroidism of renal origin: Secondary | ICD-10-CM | POA: Diagnosis not present

## 2016-12-31 DIAGNOSIS — N186 End stage renal disease: Secondary | ICD-10-CM | POA: Diagnosis not present

## 2016-12-31 DIAGNOSIS — D631 Anemia in chronic kidney disease: Secondary | ICD-10-CM | POA: Diagnosis not present

## 2016-12-31 DIAGNOSIS — E119 Type 2 diabetes mellitus without complications: Secondary | ICD-10-CM | POA: Diagnosis not present

## 2017-01-02 DIAGNOSIS — N2581 Secondary hyperparathyroidism of renal origin: Secondary | ICD-10-CM | POA: Diagnosis not present

## 2017-01-02 DIAGNOSIS — N186 End stage renal disease: Secondary | ICD-10-CM | POA: Diagnosis not present

## 2017-01-02 DIAGNOSIS — D509 Iron deficiency anemia, unspecified: Secondary | ICD-10-CM | POA: Diagnosis not present

## 2017-01-02 DIAGNOSIS — D631 Anemia in chronic kidney disease: Secondary | ICD-10-CM | POA: Diagnosis not present

## 2017-01-02 DIAGNOSIS — E119 Type 2 diabetes mellitus without complications: Secondary | ICD-10-CM | POA: Diagnosis not present

## 2017-01-04 DIAGNOSIS — N186 End stage renal disease: Secondary | ICD-10-CM | POA: Diagnosis not present

## 2017-01-04 DIAGNOSIS — D631 Anemia in chronic kidney disease: Secondary | ICD-10-CM | POA: Diagnosis not present

## 2017-01-04 DIAGNOSIS — N2581 Secondary hyperparathyroidism of renal origin: Secondary | ICD-10-CM | POA: Diagnosis not present

## 2017-01-04 DIAGNOSIS — E119 Type 2 diabetes mellitus without complications: Secondary | ICD-10-CM | POA: Diagnosis not present

## 2017-01-04 DIAGNOSIS — D509 Iron deficiency anemia, unspecified: Secondary | ICD-10-CM | POA: Diagnosis not present

## 2017-01-07 DIAGNOSIS — N186 End stage renal disease: Secondary | ICD-10-CM | POA: Diagnosis not present

## 2017-01-07 DIAGNOSIS — D631 Anemia in chronic kidney disease: Secondary | ICD-10-CM | POA: Diagnosis not present

## 2017-01-07 DIAGNOSIS — E119 Type 2 diabetes mellitus without complications: Secondary | ICD-10-CM | POA: Diagnosis not present

## 2017-01-07 DIAGNOSIS — D509 Iron deficiency anemia, unspecified: Secondary | ICD-10-CM | POA: Diagnosis not present

## 2017-01-07 DIAGNOSIS — N2581 Secondary hyperparathyroidism of renal origin: Secondary | ICD-10-CM | POA: Diagnosis not present

## 2017-01-09 DIAGNOSIS — N186 End stage renal disease: Secondary | ICD-10-CM | POA: Diagnosis not present

## 2017-01-09 DIAGNOSIS — N2581 Secondary hyperparathyroidism of renal origin: Secondary | ICD-10-CM | POA: Diagnosis not present

## 2017-01-09 DIAGNOSIS — E119 Type 2 diabetes mellitus without complications: Secondary | ICD-10-CM | POA: Diagnosis not present

## 2017-01-09 DIAGNOSIS — D631 Anemia in chronic kidney disease: Secondary | ICD-10-CM | POA: Diagnosis not present

## 2017-01-09 DIAGNOSIS — D509 Iron deficiency anemia, unspecified: Secondary | ICD-10-CM | POA: Diagnosis not present

## 2017-01-11 DIAGNOSIS — D509 Iron deficiency anemia, unspecified: Secondary | ICD-10-CM | POA: Diagnosis not present

## 2017-01-11 DIAGNOSIS — D631 Anemia in chronic kidney disease: Secondary | ICD-10-CM | POA: Diagnosis not present

## 2017-01-11 DIAGNOSIS — N2581 Secondary hyperparathyroidism of renal origin: Secondary | ICD-10-CM | POA: Diagnosis not present

## 2017-01-11 DIAGNOSIS — N186 End stage renal disease: Secondary | ICD-10-CM | POA: Diagnosis not present

## 2017-01-11 DIAGNOSIS — E119 Type 2 diabetes mellitus without complications: Secondary | ICD-10-CM | POA: Diagnosis not present

## 2017-01-14 DIAGNOSIS — N186 End stage renal disease: Secondary | ICD-10-CM | POA: Diagnosis not present

## 2017-01-14 DIAGNOSIS — D509 Iron deficiency anemia, unspecified: Secondary | ICD-10-CM | POA: Diagnosis not present

## 2017-01-14 DIAGNOSIS — D631 Anemia in chronic kidney disease: Secondary | ICD-10-CM | POA: Diagnosis not present

## 2017-01-14 DIAGNOSIS — E119 Type 2 diabetes mellitus without complications: Secondary | ICD-10-CM | POA: Diagnosis not present

## 2017-01-14 DIAGNOSIS — N2581 Secondary hyperparathyroidism of renal origin: Secondary | ICD-10-CM | POA: Diagnosis not present

## 2017-01-16 DIAGNOSIS — D509 Iron deficiency anemia, unspecified: Secondary | ICD-10-CM | POA: Diagnosis not present

## 2017-01-16 DIAGNOSIS — N186 End stage renal disease: Secondary | ICD-10-CM | POA: Diagnosis not present

## 2017-01-16 DIAGNOSIS — N2581 Secondary hyperparathyroidism of renal origin: Secondary | ICD-10-CM | POA: Diagnosis not present

## 2017-01-16 DIAGNOSIS — D631 Anemia in chronic kidney disease: Secondary | ICD-10-CM | POA: Diagnosis not present

## 2017-01-16 DIAGNOSIS — E119 Type 2 diabetes mellitus without complications: Secondary | ICD-10-CM | POA: Diagnosis not present

## 2017-01-18 DIAGNOSIS — D509 Iron deficiency anemia, unspecified: Secondary | ICD-10-CM | POA: Diagnosis not present

## 2017-01-18 DIAGNOSIS — E119 Type 2 diabetes mellitus without complications: Secondary | ICD-10-CM | POA: Diagnosis not present

## 2017-01-18 DIAGNOSIS — N186 End stage renal disease: Secondary | ICD-10-CM | POA: Diagnosis not present

## 2017-01-18 DIAGNOSIS — D631 Anemia in chronic kidney disease: Secondary | ICD-10-CM | POA: Diagnosis not present

## 2017-01-18 DIAGNOSIS — N2581 Secondary hyperparathyroidism of renal origin: Secondary | ICD-10-CM | POA: Diagnosis not present

## 2017-01-21 DIAGNOSIS — E119 Type 2 diabetes mellitus without complications: Secondary | ICD-10-CM | POA: Diagnosis not present

## 2017-01-21 DIAGNOSIS — D509 Iron deficiency anemia, unspecified: Secondary | ICD-10-CM | POA: Diagnosis not present

## 2017-01-21 DIAGNOSIS — D631 Anemia in chronic kidney disease: Secondary | ICD-10-CM | POA: Diagnosis not present

## 2017-01-21 DIAGNOSIS — N186 End stage renal disease: Secondary | ICD-10-CM | POA: Diagnosis not present

## 2017-01-21 DIAGNOSIS — N2581 Secondary hyperparathyroidism of renal origin: Secondary | ICD-10-CM | POA: Diagnosis not present

## 2017-01-23 DIAGNOSIS — N2581 Secondary hyperparathyroidism of renal origin: Secondary | ICD-10-CM | POA: Diagnosis not present

## 2017-01-23 DIAGNOSIS — D509 Iron deficiency anemia, unspecified: Secondary | ICD-10-CM | POA: Diagnosis not present

## 2017-01-23 DIAGNOSIS — D631 Anemia in chronic kidney disease: Secondary | ICD-10-CM | POA: Diagnosis not present

## 2017-01-23 DIAGNOSIS — E119 Type 2 diabetes mellitus without complications: Secondary | ICD-10-CM | POA: Diagnosis not present

## 2017-01-23 DIAGNOSIS — N186 End stage renal disease: Secondary | ICD-10-CM | POA: Diagnosis not present

## 2017-01-24 ENCOUNTER — Ambulatory Visit (INDEPENDENT_AMBULATORY_CARE_PROVIDER_SITE_OTHER): Payer: Medicare Other | Admitting: Internal Medicine

## 2017-01-24 VITALS — BP 181/91 | HR 80 | Temp 98.6°F | Ht 72.0 in | Wt 234.7 lb

## 2017-01-24 DIAGNOSIS — R059 Cough, unspecified: Secondary | ICD-10-CM | POA: Insufficient documentation

## 2017-01-24 DIAGNOSIS — R05 Cough: Secondary | ICD-10-CM | POA: Diagnosis not present

## 2017-01-24 DIAGNOSIS — I10 Essential (primary) hypertension: Secondary | ICD-10-CM | POA: Diagnosis not present

## 2017-01-24 DIAGNOSIS — Z8249 Family history of ischemic heart disease and other diseases of the circulatory system: Secondary | ICD-10-CM | POA: Diagnosis not present

## 2017-01-24 DIAGNOSIS — Z79899 Other long term (current) drug therapy: Secondary | ICD-10-CM | POA: Diagnosis not present

## 2017-01-24 MED ORDER — FAMOTIDINE 20 MG PO TABS: 20.0000 mg | ORAL_TABLET | Freq: Two times a day (BID) | ORAL | 1 refills | Status: DC | PRN

## 2017-01-24 MED ORDER — ALBUTEROL SULFATE HFA 108 (90 BASE) MCG/ACT IN AERS: 2.0000 | INHALATION_SPRAY | Freq: Four times a day (QID) | RESPIRATORY_TRACT | 2 refills | Status: AC | PRN

## 2017-01-24 NOTE — Assessment & Plan Note (Signed)
History of present illness Patient reports having a cough productive of brown/white colored sputum for the past 3 months. Denies having any fevers, chills, or chest pain. States he is a never smoker. Denies having any hemoptysis or weight loss. Reports having GERD symptoms one time last week. Reports wheezing twice a week on average. Reports having occasional episodes of shortness of breath at rest accompanied with diaphoresis; symptoms resolve with sublingual nitroglycerin. Denies having any chest pain during these episodes. Reports having rhinorrhea a month ago which has now resolved. Reports having occasional orthopnea, paroxysmal nocturnal dyspnea, and right lower extremity swelling.  Assessment Differentials for his chronic cough include GERD versus possible asthma. Lungs clear on exam at present. Patient does report having occasional symptoms of congestive heart failure, however, he is euvolemic on exam today and echo done in October 2016 showing left ventricular ejection fraction 65-70%. Postnasal drip less likely to explain his cough as his cough started before he had rhinorrhea. Chronic bronchitis less likely as patient is a never smoker. Lung cancer less likely as patient is a never smoker and chest x-ray done on 12/15/2016 was normal. He is not currently on an ACE inhibitor. His occasional episodes of shortness of breath at rest with diaphoresis and no chest pain are less likely to be due to angina as cardiac cath done in October 2016 was showing insignificant coronary artery disease (10% stenosis from proximal to mid LAD).  Plan -Pepcid 20 mg twice daily as needed -Albuterol inhaler as needed for wheezing. If patient continues to complain of wheezing in the future, consider ordering bronchoprovocation testing to rule out asthma.

## 2017-01-24 NOTE — Patient Instructions (Addendum)
Mr. Jefferys it was nice seeing you today.  -Use albuterol inhaler as instructed for wheezing/ shortness of breath  -Take Pepcid as instructed for acid reflux  -Please return to the clinic within the next 4 days with all your medications.  -For now, continue taking your blood pressure medications as before and continue going to dialysis.

## 2017-01-24 NOTE — Progress Notes (Signed)
   CC: Patient is complaining of a cough. Hypertension was also discussed during this visit.  HPI:  Jon [name] is a 61 y.o. male with a past medical history of conditions listed below presenting to the clinic to discuss a cough. Hypertension was also discussed during this visit. Please see problem based charting for the status of the patient's current and chronic medical conditions.   Past Medical History:  Diagnosis Date  . Chronic kidney disease   . Dialysis patient ([place])   . GERD (gastroesophageal reflux disease)   . History of blood transfusion   . Hyperlipidemia    takes Atorvastatin daily  . Hypertension    takes Amlodipine,Losartan,and Labetalol daily  . Myocardial infarction [facility])    lived in [place] maybe 7 years ago  . Small bowel obstruction (HCC)    Review of Systems: Pertinent positives mentioned in HPI. Remainder of all ROS negative.   Physical Exam:  Vitals:   01/24/17 1451 01/24/17 1610  BP: (!) 180/93 (!) 181/91  Pulse: 80   Temp: 98.6 F (37 C)   TempSrc: Oral   SpO2: 98%   Weight: 234 lb 11.2 oz (106.5 kg)   Height: 6' (1.829 m)    Physical Exam  Constitutional: He is oriented to person, place, and time. He appears well-developed and well-nourished. No distress.  HENT:  Head: Normocephalic and atraumatic.  Not able to visualize the oropharynx despite using a tongue depressor.  Eyes: Right eye exhibits no discharge. Left eye exhibits no discharge.  Cardiovascular: Normal rate, regular rhythm and intact distal pulses.   Pulmonary/Chest: Effort normal and breath sounds normal. No respiratory distress. He has no rales.  Abdominal: Soft. Bowel sounds are normal. He exhibits no distension. There is no tenderness.  Musculoskeletal: He exhibits no edema.  Neurological: He is alert and oriented to person, place, and time.  Skin: Skin is warm and dry.    Assessment & Plan:   See Encounters Tab for problem based charting.  Patient discussed with Dr.  Dareen Piano

## 2017-01-24 NOTE — Assessment & Plan Note (Addendum)
BP Readings from Last 3 Encounters:  01/24/17 (!) 181/91  12/16/16 (!) 155/81  10/16/16 115/68    Lab Results  Component Value Date   NA 139 12/16/2016   K 4.5 12/16/2016   CREATININE 23.53 (H) 12/16/2016    Assessment: Blood pressure control:  poorly controlled Comments: Patient is end-stage renal disease on hemodialysis. Initial blood pressure 180/93 and repeat 181/91 at this visit. He denies having any headaches, chest pain, shortness of breath, or blurry vision at present. Current medication regimen includes losartan 100 mg daily, hydralazine 50 mg 3 times a day, Coreg 25 mg twice daily, and amlodipine 10 mg daily. However, it is not clear which medications the patient is taking as he reports taking all his medications only at night. I reviewed his pharmacy records with Dr. Maudie Mercury. He received losartan and carvedilol on 10/10/2016 for a 90 day supply. As such, he should've run out of these medication by now if he was truly compliant. In addition, he received hydralazine and amlodipine on 11/27/2016 for a 30 day supply and again should've run out of these medications by now. I again spoke to the patient and he confirmed he has been taking all of his medications and has refills left.  Plan: Medications: There is discordance between the patient's history and review of pharmacy records. As such, it is difficult to make recommendations about medication management. I advised him to continue taking his current medications including losartan, hydralazine, Coreg, and amlodipine. Advised him to return to the clinic within the next 4 days with all his medications. Patient told me he has dialysis on Friday and Monday but will be able to return to the clinic on Tuesday, July 3rd. If it is discovered at the next visit that the patient is actually taking all for these medications and blood pressure is still uncontrolled, consider discontinuing hydralazine and Coreg due to the multiple times a day dosing. No  strong indication to be on a beta blocker (prior echo with normal LVEF and cath showing insignificant coronary artery disease). Consider starting clonidine 0.1 mg weekly patch to improve medication compliance. Educational resources provided:   Educated patient about healthy eating and exercise. Emphasized the importance of weight loss.

## 2017-01-25 DIAGNOSIS — E119 Type 2 diabetes mellitus without complications: Secondary | ICD-10-CM | POA: Diagnosis not present

## 2017-01-25 DIAGNOSIS — N186 End stage renal disease: Secondary | ICD-10-CM | POA: Diagnosis not present

## 2017-01-25 DIAGNOSIS — N2581 Secondary hyperparathyroidism of renal origin: Secondary | ICD-10-CM | POA: Diagnosis not present

## 2017-01-25 DIAGNOSIS — D509 Iron deficiency anemia, unspecified: Secondary | ICD-10-CM | POA: Diagnosis not present

## 2017-01-25 DIAGNOSIS — D631 Anemia in chronic kidney disease: Secondary | ICD-10-CM | POA: Diagnosis not present

## 2017-01-26 DIAGNOSIS — I120 Hypertensive chronic kidney disease with stage 5 chronic kidney disease or end stage renal disease: Secondary | ICD-10-CM | POA: Diagnosis not present

## 2017-01-26 DIAGNOSIS — Z992 Dependence on renal dialysis: Secondary | ICD-10-CM | POA: Diagnosis not present

## 2017-01-26 DIAGNOSIS — N186 End stage renal disease: Secondary | ICD-10-CM | POA: Diagnosis not present

## 2017-01-28 DIAGNOSIS — N2581 Secondary hyperparathyroidism of renal origin: Secondary | ICD-10-CM | POA: Diagnosis not present

## 2017-01-28 DIAGNOSIS — N186 End stage renal disease: Secondary | ICD-10-CM | POA: Diagnosis not present

## 2017-01-28 DIAGNOSIS — D509 Iron deficiency anemia, unspecified: Secondary | ICD-10-CM | POA: Diagnosis not present

## 2017-01-28 DIAGNOSIS — D631 Anemia in chronic kidney disease: Secondary | ICD-10-CM | POA: Diagnosis not present

## 2017-01-28 DIAGNOSIS — E119 Type 2 diabetes mellitus without complications: Secondary | ICD-10-CM | POA: Diagnosis not present

## 2017-01-29 ENCOUNTER — Ambulatory Visit (INDEPENDENT_AMBULATORY_CARE_PROVIDER_SITE_OTHER): Payer: Medicare Other | Admitting: Internal Medicine

## 2017-01-29 ENCOUNTER — Encounter: Payer: Self-pay | Admitting: Internal Medicine

## 2017-01-29 VITALS — BP 186/84 | HR 78 | Temp 99.0°F | Ht 72.0 in | Wt 233.9 lb

## 2017-01-29 DIAGNOSIS — I10 Essential (primary) hypertension: Secondary | ICD-10-CM

## 2017-01-29 DIAGNOSIS — Z79899 Other long term (current) drug therapy: Secondary | ICD-10-CM

## 2017-01-29 DIAGNOSIS — Z8249 Family history of ischemic heart disease and other diseases of the circulatory system: Secondary | ICD-10-CM | POA: Diagnosis not present

## 2017-01-29 MED ORDER — HYDRALAZINE HCL 50 MG PO TABS: 50.0000 mg | ORAL_TABLET | Freq: Two times a day (BID) | ORAL | 0 refills | Status: DC

## 2017-01-29 MED ORDER — SPIRONOLACTONE 50 MG PO TABS: 50.0000 mg | ORAL_TABLET | Freq: Every day | ORAL | 0 refills | Status: DC

## 2017-01-29 MED ORDER — CARVEDILOL 25 MG PO TABS: 25.0000 mg | ORAL_TABLET | Freq: Two times a day (BID) | ORAL | 0 refills | Status: DC

## 2017-01-29 NOTE — Patient Instructions (Signed)
Take the following medications for your high blood pressure:  Losartan 100 mg daily   Hydralazine 100 mg twice daily (new dose)  Carvedilol 25 mg twice daily  Amlodipine 10 mg daily  Spironolactone 50 mg daily (new dose)  Return for a follow-up in 2 weeks.

## 2017-01-29 NOTE — Progress Notes (Signed)
Internal Medicine Clinic Attending  Case discussed with Dr. Rathoreat the time of the visit. We reviewed the resident's history and exam and pertinent patient test results. I agree with the assessment, diagnosis, and plan of care documented in the resident's note.  

## 2017-01-30 DIAGNOSIS — D631 Anemia in chronic kidney disease: Secondary | ICD-10-CM | POA: Diagnosis not present

## 2017-01-30 DIAGNOSIS — N186 End stage renal disease: Secondary | ICD-10-CM | POA: Diagnosis not present

## 2017-01-30 DIAGNOSIS — N2581 Secondary hyperparathyroidism of renal origin: Secondary | ICD-10-CM | POA: Diagnosis not present

## 2017-01-30 DIAGNOSIS — E119 Type 2 diabetes mellitus without complications: Secondary | ICD-10-CM | POA: Diagnosis not present

## 2017-01-30 DIAGNOSIS — D509 Iron deficiency anemia, unspecified: Secondary | ICD-10-CM | POA: Diagnosis not present

## 2017-01-30 NOTE — Assessment & Plan Note (Signed)
BP Readings from Last 3 Encounters:  01/29/17 (!) 186/84  01/24/17 (!) 181/91  12/16/16 (!) 155/81    Lab Results  Component Value Date   NA 139 12/16/2016   K 4.5 12/16/2016   CREATININE 23.53 (H) 12/16/2016    Assessment: Blood pressure control:  poorly controlled Comments: His blood pressure continues to be elevated at this visit. Patient brought his medication bottles to this visit. He is currently taking losartan 100 mg daily, hydralazine 50 mg twice daily, Coreg 25 mg twice daily, amlodipine 10 mg daily, and spironolactone 25 mg daily. He denies having any headaches, blurry vision, chest pain, or shortness of breath. Reports compliance with his medications and Monday Wednesday Friday hemodialysis.  Plan: Medications: Increase dose of spironolactone to 50 mg daily and hydralazine 200 mg twice daily. Continue losartan, Coreg, and amlodipine as above. Educational resources provided: brochure (denies need ) Other plans: Return to clinic in 2 weeks.

## 2017-01-30 NOTE — Progress Notes (Signed)
   CC: Patient is here for a follow-up of his hypertension.  HPI:  [name] is a 61 y.o. male with a past medical history of conditions listed below presenting to the clinic for a follow-up of his hypertension. Please see problem based charting for the status of the patient's current and chronic medical conditions.   Past Medical History:  Diagnosis Date  . Chronic kidney disease   . Dialysis patient ([place])   . GERD (gastroesophageal reflux disease)   . History of blood transfusion   . Hyperlipidemia    takes Atorvastatin daily  . Hypertension    takes Amlodipine,Losartan,and Labetalol daily  . Myocardial infarction [facility])    lived in [place] maybe 7 years ago  . Small bowel obstruction (HCC)    Review of Systems: Pertinent positives mentioned in HPI. Remainder of all ROS negative.   Physical Exam:  Vitals:   01/29/17 1556  BP: (!) 186/84  Pulse: 78  Temp: 99 F (37.2 C)  TempSrc: Oral  SpO2: 100%  Weight: 233 lb 14.4 oz (106.1 kg)  Height: 6' (1.829 m)   Physical Exam  Constitutional: He is oriented to person, place, and time. He appears well-developed and well-nourished. No distress.  HENT:  Head: Normocephalic and atraumatic.  Eyes: Right eye exhibits no discharge. Left eye exhibits no discharge.  Cardiovascular: Normal rate, regular rhythm and intact distal pulses.   Pulmonary/Chest: Effort normal and breath sounds normal. No respiratory distress. He has no wheezes. He has no rales.  Abdominal: Soft. Bowel sounds are normal. He exhibits no distension. There is no tenderness.  Musculoskeletal: He exhibits no edema.  Neurological: He is alert and oriented to person, place, and time.  Skin: Skin is warm and dry.    Assessment & Plan:   See Encounters Tab for problem based charting.  Patient discussed with Dr. Evette Doffing

## 2017-01-31 NOTE — Progress Notes (Signed)
Internal Medicine Clinic Attending  Case discussed with Dr. Rathoreat the time of the visit. We reviewed the resident's history and exam and pertinent patient test results. I agree with the assessment, diagnosis, and plan of care documented in the resident's note.  

## 2017-02-01 DIAGNOSIS — N2581 Secondary hyperparathyroidism of renal origin: Secondary | ICD-10-CM | POA: Diagnosis not present

## 2017-02-01 DIAGNOSIS — D631 Anemia in chronic kidney disease: Secondary | ICD-10-CM | POA: Diagnosis not present

## 2017-02-01 DIAGNOSIS — D509 Iron deficiency anemia, unspecified: Secondary | ICD-10-CM | POA: Diagnosis not present

## 2017-02-01 DIAGNOSIS — E119 Type 2 diabetes mellitus without complications: Secondary | ICD-10-CM | POA: Diagnosis not present

## 2017-02-01 DIAGNOSIS — N186 End stage renal disease: Secondary | ICD-10-CM | POA: Diagnosis not present

## 2017-02-04 DIAGNOSIS — E119 Type 2 diabetes mellitus without complications: Secondary | ICD-10-CM | POA: Diagnosis not present

## 2017-02-04 DIAGNOSIS — N186 End stage renal disease: Secondary | ICD-10-CM | POA: Diagnosis not present

## 2017-02-04 DIAGNOSIS — D509 Iron deficiency anemia, unspecified: Secondary | ICD-10-CM | POA: Diagnosis not present

## 2017-02-04 DIAGNOSIS — D631 Anemia in chronic kidney disease: Secondary | ICD-10-CM | POA: Diagnosis not present

## 2017-02-04 DIAGNOSIS — N2581 Secondary hyperparathyroidism of renal origin: Secondary | ICD-10-CM | POA: Diagnosis not present

## 2017-02-06 DIAGNOSIS — N186 End stage renal disease: Secondary | ICD-10-CM | POA: Diagnosis not present

## 2017-02-06 DIAGNOSIS — D509 Iron deficiency anemia, unspecified: Secondary | ICD-10-CM | POA: Diagnosis not present

## 2017-02-06 DIAGNOSIS — N2581 Secondary hyperparathyroidism of renal origin: Secondary | ICD-10-CM | POA: Diagnosis not present

## 2017-02-06 DIAGNOSIS — E119 Type 2 diabetes mellitus without complications: Secondary | ICD-10-CM | POA: Diagnosis not present

## 2017-02-06 DIAGNOSIS — D631 Anemia in chronic kidney disease: Secondary | ICD-10-CM | POA: Diagnosis not present

## 2017-02-08 DIAGNOSIS — D631 Anemia in chronic kidney disease: Secondary | ICD-10-CM | POA: Diagnosis not present

## 2017-02-08 DIAGNOSIS — D509 Iron deficiency anemia, unspecified: Secondary | ICD-10-CM | POA: Diagnosis not present

## 2017-02-08 DIAGNOSIS — E119 Type 2 diabetes mellitus without complications: Secondary | ICD-10-CM | POA: Diagnosis not present

## 2017-02-08 DIAGNOSIS — N186 End stage renal disease: Secondary | ICD-10-CM | POA: Diagnosis not present

## 2017-02-08 DIAGNOSIS — N2581 Secondary hyperparathyroidism of renal origin: Secondary | ICD-10-CM | POA: Diagnosis not present

## 2017-02-11 DIAGNOSIS — N186 End stage renal disease: Secondary | ICD-10-CM | POA: Diagnosis not present

## 2017-02-11 DIAGNOSIS — D631 Anemia in chronic kidney disease: Secondary | ICD-10-CM | POA: Diagnosis not present

## 2017-02-11 DIAGNOSIS — E119 Type 2 diabetes mellitus without complications: Secondary | ICD-10-CM | POA: Diagnosis not present

## 2017-02-11 DIAGNOSIS — N2581 Secondary hyperparathyroidism of renal origin: Secondary | ICD-10-CM | POA: Diagnosis not present

## 2017-02-11 DIAGNOSIS — D509 Iron deficiency anemia, unspecified: Secondary | ICD-10-CM | POA: Diagnosis not present

## 2017-02-13 DIAGNOSIS — E119 Type 2 diabetes mellitus without complications: Secondary | ICD-10-CM | POA: Diagnosis not present

## 2017-02-13 DIAGNOSIS — D509 Iron deficiency anemia, unspecified: Secondary | ICD-10-CM | POA: Diagnosis not present

## 2017-02-13 DIAGNOSIS — N186 End stage renal disease: Secondary | ICD-10-CM | POA: Diagnosis not present

## 2017-02-13 DIAGNOSIS — D631 Anemia in chronic kidney disease: Secondary | ICD-10-CM | POA: Diagnosis not present

## 2017-02-13 DIAGNOSIS — N2581 Secondary hyperparathyroidism of renal origin: Secondary | ICD-10-CM | POA: Diagnosis not present

## 2017-02-14 ENCOUNTER — Ambulatory Visit (INDEPENDENT_AMBULATORY_CARE_PROVIDER_SITE_OTHER): Payer: Medicare Other | Admitting: Pulmonary Disease

## 2017-02-14 ENCOUNTER — Encounter: Payer: Self-pay | Admitting: Pulmonary Disease

## 2017-02-14 VITALS — BP 172/118 | HR 75 | Ht 72.0 in | Wt 234.0 lb

## 2017-02-14 DIAGNOSIS — I10 Essential (primary) hypertension: Secondary | ICD-10-CM

## 2017-02-14 DIAGNOSIS — R0683 Snoring: Secondary | ICD-10-CM | POA: Diagnosis not present

## 2017-02-14 DIAGNOSIS — R05 Cough: Secondary | ICD-10-CM | POA: Diagnosis not present

## 2017-02-14 DIAGNOSIS — R053 Chronic cough: Secondary | ICD-10-CM

## 2017-02-14 NOTE — Progress Notes (Signed)
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  Family history His family history includes Diabetes in his father; Hypertension in his father, mother, and sister; Other in his brother, mother, and sister.  Social history He  reports that he has never smoked. He has never used smokeless tobacco. He reports that he does not drink alcohol or use drugs.   Review of Systems  Constitutional: Negative for fever and unexpected weight change.  HENT: Positive for congestion. Negative for dental problem, ear pain, nosebleeds, postnasal drip, rhinorrhea, sinus pressure, sneezing, sore throat and trouble swallowing.   Eyes: Negative for redness and itching.  Respiratory: Positive for cough and shortness of breath. Negative for chest tightness and wheezing.   Cardiovascular: Positive for chest pain and palpitations. Negative for leg swelling.  Gastrointestinal: Negative for nausea and vomiting.  Genitourinary: Negative for dysuria.  Musculoskeletal: Negative for joint swelling.  Skin: Negative for rash.  Neurological: Negative for headaches.  Hematological: Does not bruise/bleed easily.  Psychiatric/Behavioral: Negative for dysphoric mood. The patient is not nervous/anxious.     No Known Allergies  Current Outpatient Prescriptions on File Prior to Visit  Medication Sig  . albuterol (PROVENTIL HFA;VENTOLIN HFA) 108 (90 Base) MCG/ACT inhaler Inhale 2 puffs into the lungs every 6 (six) hours as needed for wheezing or shortness of breath.  Marland Kitchen amLODipine (NORVASC) 10 MG tablet Take 10 mg  by mouth at bedtime.  Marland Kitchen aspirin EC 81 MG EC tablet Take 1 tablet (81 mg total) by mouth daily.  Marland Kitchen atorvastatin (LIPITOR) 20 MG tablet Take 1 tablet (20 mg total) by mouth daily at 6 PM.  . carvedilol (COREG) 25 MG tablet Take 1 tablet (25 mg total) by mouth 2 (two) times daily.  . famotidine (PEPCID) 20 MG tablet Take 1 tablet (20 mg total) by mouth 2 (two) times daily as needed for heartburn or indigestion.  . hydrALAZINE (APRESOLINE) 50 MG tablet Take 1 tablet (50 mg total) by mouth 2 (two) times daily.  Marland Kitchen losartan (COZAAR) 100 MG tablet Take 1 tablet (100 mg total) by mouth at bedtime.  Marland Kitchen spironolactone (ALDACTONE) 50 MG tablet Take 1 tablet (50 mg total) by mouth daily.   No current facility-administered medications on file prior to visit.     Chief Complaint  Patient presents with  . PULMONARY CONSULT    Referred by Dr Joelyn Oms for chronic cough. COugh has been present for about 3 months. Pt states that he has to sleep sitting up, unable to lay down flat d/t diffculty breathing.     Cardiac tests Echo 05/03/15 >> EF 65 to 70%, grade 2 DD  Past medical history He  has a past medical history of Chronic kidney disease; Dialysis patient ([place]); GERD (gastroesophageal reflux disease); History of blood transfusion; Hyperlipidemia; Hypertension; Myocardial infarction [facility]); and Small bowel obstruction ([place]).  Vital signs BP (!) 172/118 (BP Location: Right Leg, Cuff Size: Normal) Comment: pt has dialysis ports in both arms  Pulse 75   Ht 6' (1.829 m)   Wt 234 lb (106.1 kg)   SpO2 99%   BMI 31.74 kg/m   History of present illness Jon [name] is a 61 y.o. male with chronic cough.  He has a  history of ESRD 2nd to refractory hypertension.  His cough has been present for at least 3 months, but might have been present for longer than this.  His cough seems to happen mostly at night after he goes to sleep.  He will suddenly wake up feeling like he can't breath and something is stuck in his  throat.  He has to sit up and catch his breath.  This happens throughout the night.  He doesn't feel like he can get sleep, and feels tired during the day.  He reports having a sleep study years ago in [place], but doesn't remember what this showed.  He denies history of smoking, asthma, pneumonia, or exposure to tuberculosis.  He worked in [employer], but hasn't worked in 5 yrs.  He denies animal or bird exposures.  No recent sick exposures.    He was given an albuterol inhaler, but this didn't help.  He occasionally gets cough during the day and can bring up clear sputum, but this is less consistent.  He is not having sinus congestion, post nasal drip, sore throat, or reflux.  He does have trouble breathing through his right nostril, but this has been chronic.  Chest xray from 12/15/16 was normal.  Epworth sleepiness scale 9 out of 24.   Physical exam  General - No distress ENT - No sinus tenderness, no oral exudate, no LAN, no thyromegaly, TM clear, pupils equal/reactive, MP 4, enlarged tongue Cardiac - s1s2 regular, no murmur, pulses symmetric Chest - No wheeze/rales/dullness, good air entry, normal respiratory excursion Back - No focal tenderness Abd - Soft, non-tender, no organomegaly, + bowel sounds Ext - AV graft in Lt upper arm and Rt forearm Neuro - Normal strength, cranial nerves intact Skin - No rashes Psych - Normal mood, and behavior   CMP Latest Ref Rng & Units 12/16/2016 12/15/2016 06/01/2016  Glucose 65 - 99 mg/dL 83 86 118(H)  BUN 6 - 20 mg/dL 54(H) 50(H) 17  Creatinine 0.61 - 1.24 mg/dL 23.53(H) >18.00(H) 8.79(H)  Sodium 135 - 145 mmol/L 139 137 138  Potassium 3.5 - 5.1 mmol/L 4.5 5.7(H) 3.5  Chloride 101 - 111 mmol/L 98(L) 99(L) 95(L)  CO2 22 - 32 mmol/L 26 - 33(H)  Calcium 8.9 - 10.3 mg/dL 7.2(L) - 8.5(L)  Total Protein 6.5 - 8.1 g/dL 6.3(L) - -  Total Bilirubin 0.3 - 1.2 mg/dL 0.8 - -  Alkaline Phos 38 - 126 U/L 45 - -  AST 15 - 41 U/L 17 - -  ALT 17 - 63  U/L 11(L) - -     CBC Latest Ref Rng & Units 12/16/2016 12/15/2016 12/15/2016  WBC 4.0 - 10.5 K/uL 4.9 - 4.7  Hemoglobin 13.0 - 17.0 g/dL 9.5(L) 9.9(L) 9.6(L)  Hematocrit 39.0 - 52.0 % 30.4(L) 29.0(L) 30.9(L)  Platelets 150 - 400 K/uL 89(L) - 103(L)     ABG    Component Value Date/Time   PHART 7.412 06/26/2010 1035   PCO2ART 43.6 06/26/2010 1035   PO2ART 78.0 (L) 06/26/2010 1035   HCO3 27.7 (H) 06/26/2010 1035   TCO2 27 12/15/2016 2121   O2SAT 95.0 06/26/2010 1035    Discussion He reports chronic cough.  His symptoms are more suggestive of sleep apnea.  He has hx of refractory hypertension causing ESRD.     Assessment/plan  Possible obstructive sleep apnea. - will arrange for in lab sleep study  Chronic cough. - will arrange for pulmonary function test - can continue prn albuterol for now  Patient Instructions  Will schedule in lab sleep study and pulmonary function test  Will call to schedule follow up after test results reviewed    Chesley Mires, MD [place] [facility] Pager:  [phone] 02/14/2017, 5:39 PM

## 2017-02-14 NOTE — Patient Instructions (Signed)
Will schedule in lab sleep study and pulmonary function test  Will call to schedule follow up after test results reviewed

## 2017-02-14 NOTE — Progress Notes (Signed)
   Subjective:    Patient ID: Jon [name], male    DOB: 04-Feb-1956, 61 y.o.   MRN: [id number]  HPI    Review of Systems  Constitutional: Negative for fever and unexpected weight change.  HENT: Positive for congestion. Negative for dental problem, ear pain, nosebleeds, postnasal drip, rhinorrhea, sinus pressure, sneezing, sore throat and trouble swallowing.   Eyes: Negative for redness and itching.  Respiratory: Positive for cough and shortness of breath. Negative for chest tightness and wheezing.   Cardiovascular: Positive for chest pain and palpitations. Negative for leg swelling.  Gastrointestinal: Negative for nausea and vomiting.  Genitourinary: Negative for dysuria.  Musculoskeletal: Negative for joint swelling.  Skin: Negative for rash.  Neurological: Negative for headaches.  Hematological: Does not bruise/bleed easily.  Psychiatric/Behavioral: Negative for dysphoric mood. The patient is not nervous/anxious.        Objective:   Physical Exam        Assessment & Plan:

## 2017-02-15 DIAGNOSIS — D509 Iron deficiency anemia, unspecified: Secondary | ICD-10-CM | POA: Diagnosis not present

## 2017-02-15 DIAGNOSIS — E119 Type 2 diabetes mellitus without complications: Secondary | ICD-10-CM | POA: Diagnosis not present

## 2017-02-15 DIAGNOSIS — N186 End stage renal disease: Secondary | ICD-10-CM | POA: Diagnosis not present

## 2017-02-15 DIAGNOSIS — N2581 Secondary hyperparathyroidism of renal origin: Secondary | ICD-10-CM | POA: Diagnosis not present

## 2017-02-15 DIAGNOSIS — D631 Anemia in chronic kidney disease: Secondary | ICD-10-CM | POA: Diagnosis not present

## 2017-02-18 DIAGNOSIS — D631 Anemia in chronic kidney disease: Secondary | ICD-10-CM | POA: Diagnosis not present

## 2017-02-18 DIAGNOSIS — N2581 Secondary hyperparathyroidism of renal origin: Secondary | ICD-10-CM | POA: Diagnosis not present

## 2017-02-18 DIAGNOSIS — N186 End stage renal disease: Secondary | ICD-10-CM | POA: Diagnosis not present

## 2017-02-18 DIAGNOSIS — E119 Type 2 diabetes mellitus without complications: Secondary | ICD-10-CM | POA: Diagnosis not present

## 2017-02-18 DIAGNOSIS — D509 Iron deficiency anemia, unspecified: Secondary | ICD-10-CM | POA: Diagnosis not present

## 2017-02-19 ENCOUNTER — Ambulatory Visit (INDEPENDENT_AMBULATORY_CARE_PROVIDER_SITE_OTHER): Payer: Medicare Other | Admitting: Pulmonary Disease

## 2017-02-19 ENCOUNTER — Ambulatory Visit (INDEPENDENT_AMBULATORY_CARE_PROVIDER_SITE_OTHER): Payer: Medicare Other | Admitting: Internal Medicine

## 2017-02-19 ENCOUNTER — Encounter: Payer: Self-pay | Admitting: Internal Medicine

## 2017-02-19 VITALS — BP 180/92 | HR 88 | Temp 98.3°F | Ht 72.0 in | Wt 234.7 lb

## 2017-02-19 DIAGNOSIS — Z79899 Other long term (current) drug therapy: Secondary | ICD-10-CM

## 2017-02-19 DIAGNOSIS — R05 Cough: Secondary | ICD-10-CM

## 2017-02-19 DIAGNOSIS — Z8249 Family history of ischemic heart disease and other diseases of the circulatory system: Secondary | ICD-10-CM

## 2017-02-19 DIAGNOSIS — I10 Essential (primary) hypertension: Secondary | ICD-10-CM

## 2017-02-19 DIAGNOSIS — R059 Cough, unspecified: Secondary | ICD-10-CM

## 2017-02-19 DIAGNOSIS — R053 Chronic cough: Secondary | ICD-10-CM

## 2017-02-19 LAB — PULMONARY FUNCTION TEST
DL/VA % pred: 100 %
DL/VA: 4.75 ml/min/mmHg/L
DLCO COR % PRED: 66 %
DLCO cor: 23.35 ml/min/mmHg
DLCO unc % pred: 66 %
DLCO unc: 23.15 ml/min/mmHg
FEF 25-75 POST: 2.66 L/s
FEF 25-75 Pre: 1 L/sec
FEF2575-%Change-Post: 166 %
FEF2575-%PRED-POST: 84 %
FEF2575-%PRED-PRE: 31 %
FEV1-%CHANGE-POST: 27 %
FEV1-%Pred-Post: 56 %
FEV1-%Pred-Pre: 44 %
FEV1-POST: 1.93 L
FEV1-PRE: 1.52 L
FEV1FVC-%CHANGE-POST: 7 %
FEV1FVC-%PRED-PRE: 90 %
FEV6-%Change-Post: 16 %
FEV6-%Pred-Post: 59 %
FEV6-%Pred-Pre: 50 %
FEV6-POST: 2.48 L
FEV6-Pre: 2.12 L
FEV6FVC-%Change-Post: 0 %
FEV6FVC-%PRED-POST: 103 %
FEV6FVC-%Pred-Pre: 102 %
FVC-%CHANGE-POST: 17 %
FVC-%PRED-POST: 57 %
FVC-%PRED-PRE: 48 %
FVC-POST: 2.51 L
FVC-PRE: 2.13 L
POST FEV1/FVC RATIO: 77 %
PRE FEV1/FVC RATIO: 71 %
PRE FEV6/FVC RATIO: 100 %
Post FEV6/FVC ratio: 100 %

## 2017-02-19 NOTE — Progress Notes (Signed)
PFT done today. 

## 2017-02-19 NOTE — Assessment & Plan Note (Signed)
BP Readings from Last 3 Encounters:  02/19/17 (!) 180/92  02/14/17 (!) 172/118  01/29/17 (!) 186/84    Lab Results  Component Value Date   NA 139 12/16/2016   K 4.5 12/16/2016   CREATININE 23.53 (H) 12/16/2016    Assessment: Blood pressure control:  above goal Comments: Current medication regimen includes spironolactone 50 mg daily, hydralazine 200 mg twice daily, losartan 100 mg daily, Coreg 25 mg twice daily, and amlodipine 10 mg daily. Patient did not bring his medications to this visit. His blood pressure continues to be uncontrolled. States he did not take his blood pressure medications today. Reports compliance with Monday Wednesday Friday hemodialysis. Denies having any headaches, chest pain, or abdominal pain.  Plan: Medications:  continue current medications. Emphasized importance of medication compliance. Educational resources provided: brochure (denies need ) Educated patient about healthy eating and exercise. Emphasized the importance of weight loss.  Other plans: Return to the clinic in 2 weeks for blood pressure recheck.

## 2017-02-19 NOTE — Progress Notes (Signed)
   CC: Patient is here for a follow-up of his uncontrolled hypertension.  HPI:  [name] is a 61 y.o. male with a past medical history of conditions listed below presenting to the clinic for a follow-up of his uncontrolled hypertension. Please see problem based charting for the status of the patient's current and chronic medical conditions.   Past Medical History:  Diagnosis Date  . Chronic kidney disease   . Dialysis patient ([place])   . GERD (gastroesophageal reflux disease)   . History of blood transfusion   . Hyperlipidemia    takes Atorvastatin daily  . Hypertension    takes Amlodipine,Losartan,and Labetalol daily  . Myocardial infarction [facility])    lived in [place] maybe 7 years ago  . Small bowel obstruction (HCC)    Review of Systems: Pertinent positives mentioned in HPI. Remainder of all ROS negative.   Physical Exam:  Vitals:   02/19/17 1344  BP: (!) 180/92  Pulse: 88  Temp: 98.3 F (36.8 C)  TempSrc: Oral  SpO2: 98%  Weight: 234 lb 11.2 oz (106.5 kg)  Height: 6' (1.829 m)   Physical Exam  Constitutional: He is oriented to person, place, and time. He appears well-developed and well-nourished. No distress.  HENT:  Head: Normocephalic and atraumatic.  Eyes: Right eye exhibits no discharge. Left eye exhibits no discharge.  Neck: Neck supple. No tracheal deviation present.  Cardiovascular: Normal rate, regular rhythm and intact distal pulses.   Pulmonary/Chest: Effort normal and breath sounds normal. No respiratory distress. He has no wheezes. He has no rales.  Abdominal: Soft. Bowel sounds are normal. He exhibits no distension. There is no tenderness.  Musculoskeletal: He exhibits no edema.  Neurological: He is alert and oriented to person, place, and time.  Skin: Skin is warm and dry.    Assessment & Plan:   See Encounters Tab for problem based charting.  Patient discussed with Dr. Daryll Drown

## 2017-02-19 NOTE — Assessment & Plan Note (Addendum)
Assessment Please see my previous note from 01/24/2017. Patient continues to complain of a cough productive of white/yellow sputum at this visit.  Also reports having shortness of breath and wheezing when walking. Reports using his rescue inhaler once a day. No associated fevers, chills, rhinorrhea, sore throat, chest pain, diaphoresis, orthopnea, lower extremity edema, or GERD symptoms. Lungs clear on exam. He was seen by pulmonology in 02/14/2017 and sent for a sleep study and PFTs. Sleep study is scheduled on 04/16/2017 and PFTs are scheduled for today.  Plan -Advised him to go for his PFTs today -Continue albuterol inhaler as needed -Return to the clinic in 2 weeks

## 2017-02-19 NOTE — Patient Instructions (Signed)
Mr. Tallon it was nice seeing you today.  Continue taking your blood pressure medications  Return to the clinic in 2 weeks for a blood pressure recheck

## 2017-02-20 DIAGNOSIS — E1151 Type 2 diabetes mellitus with diabetic peripheral angiopathy without gangrene: Secondary | ICD-10-CM | POA: Diagnosis not present

## 2017-02-20 DIAGNOSIS — N2581 Secondary hyperparathyroidism of renal origin: Secondary | ICD-10-CM | POA: Diagnosis not present

## 2017-02-20 DIAGNOSIS — E119 Type 2 diabetes mellitus without complications: Secondary | ICD-10-CM | POA: Diagnosis not present

## 2017-02-20 DIAGNOSIS — D509 Iron deficiency anemia, unspecified: Secondary | ICD-10-CM | POA: Diagnosis not present

## 2017-02-20 DIAGNOSIS — N186 End stage renal disease: Secondary | ICD-10-CM | POA: Diagnosis not present

## 2017-02-20 DIAGNOSIS — D631 Anemia in chronic kidney disease: Secondary | ICD-10-CM | POA: Diagnosis not present

## 2017-02-22 ENCOUNTER — Inpatient Hospital Stay (HOSPITAL_COMMUNITY)
Admission: EM | Admit: 2017-02-22 | Discharge: 2017-02-23 | DRG: 291 | Disposition: A | Discharge status: Home / Self Care | Payer: Medicare Other | Attending: Internal Medicine | Admitting: Internal Medicine

## 2017-02-22 ENCOUNTER — Encounter (HOSPITAL_COMMUNITY): Payer: Self-pay | Admitting: Emergency Medicine

## 2017-02-22 ENCOUNTER — Emergency Department (HOSPITAL_COMMUNITY): Payer: Medicare Other

## 2017-02-22 ENCOUNTER — Other Ambulatory Visit: Payer: Self-pay

## 2017-02-22 DIAGNOSIS — R05 Cough: Secondary | ICD-10-CM | POA: Diagnosis present

## 2017-02-22 DIAGNOSIS — Z833 Family history of diabetes mellitus: Secondary | ICD-10-CM

## 2017-02-22 DIAGNOSIS — M7989 Other specified soft tissue disorders: Secondary | ICD-10-CM | POA: Diagnosis present

## 2017-02-22 DIAGNOSIS — J811 Chronic pulmonary edema: Secondary | ICD-10-CM | POA: Diagnosis present

## 2017-02-22 DIAGNOSIS — I509 Heart failure, unspecified: Secondary | ICD-10-CM | POA: Diagnosis present

## 2017-02-22 DIAGNOSIS — D696 Thrombocytopenia, unspecified: Secondary | ICD-10-CM | POA: Diagnosis present

## 2017-02-22 DIAGNOSIS — M25571 Pain in right ankle and joints of right foot: Secondary | ICD-10-CM | POA: Diagnosis present

## 2017-02-22 DIAGNOSIS — R0602 Shortness of breath: Secondary | ICD-10-CM | POA: Diagnosis present

## 2017-02-22 DIAGNOSIS — R059 Cough, unspecified: Secondary | ICD-10-CM | POA: Diagnosis present

## 2017-02-22 DIAGNOSIS — N186 End stage renal disease: Secondary | ICD-10-CM | POA: Diagnosis present

## 2017-02-22 DIAGNOSIS — I252 Old myocardial infarction: Secondary | ICD-10-CM

## 2017-02-22 DIAGNOSIS — I5031 Acute diastolic (congestive) heart failure: Secondary | ICD-10-CM | POA: Diagnosis not present

## 2017-02-22 DIAGNOSIS — Z8249 Family history of ischemic heart disease and other diseases of the circulatory system: Secondary | ICD-10-CM | POA: Diagnosis not present

## 2017-02-22 DIAGNOSIS — D631 Anemia in chronic kidney disease: Secondary | ICD-10-CM | POA: Diagnosis present

## 2017-02-22 DIAGNOSIS — I132 Hypertensive heart and chronic kidney disease with heart failure and with stage 5 chronic kidney disease, or end stage renal disease: Principal | ICD-10-CM | POA: Diagnosis present

## 2017-02-22 DIAGNOSIS — K219 Gastro-esophageal reflux disease without esophagitis: Secondary | ICD-10-CM | POA: Diagnosis present

## 2017-02-22 DIAGNOSIS — E785 Hyperlipidemia, unspecified: Secondary | ICD-10-CM | POA: Diagnosis present

## 2017-02-22 DIAGNOSIS — Z8719 Personal history of other diseases of the digestive system: Secondary | ICD-10-CM

## 2017-02-22 DIAGNOSIS — I10 Essential (primary) hypertension: Secondary | ICD-10-CM | POA: Diagnosis present

## 2017-02-22 DIAGNOSIS — Z79899 Other long term (current) drug therapy: Secondary | ICD-10-CM

## 2017-02-22 DIAGNOSIS — Z992 Dependence on renal dialysis: Secondary | ICD-10-CM | POA: Diagnosis not present

## 2017-02-22 DIAGNOSIS — R0600 Dyspnea, unspecified: Secondary | ICD-10-CM | POA: Diagnosis not present

## 2017-02-22 DIAGNOSIS — R069 Unspecified abnormalities of breathing: Secondary | ICD-10-CM | POA: Diagnosis not present

## 2017-02-22 DIAGNOSIS — Z7982 Long term (current) use of aspirin: Secondary | ICD-10-CM

## 2017-02-22 DIAGNOSIS — R000 Tachycardia, unspecified: Secondary | ICD-10-CM | POA: Diagnosis not present

## 2017-02-22 LAB — COMPREHENSIVE METABOLIC PANEL
ALBUMIN: 3.4 g/dL — AB (ref 3.5–5.0)
ALT: 12 U/L — AB (ref 17–63)
AST: 18 U/L (ref 15–41)
Alkaline Phosphatase: 49 U/L (ref 38–126)
Anion gap: 11 (ref 5–15)
BUN: 36 mg/dL — ABNORMAL HIGH (ref 6–20)
CHLORIDE: 98 mmol/L — AB (ref 101–111)
CO2: 29 mmol/L (ref 22–32)
CREATININE: 15.38 mg/dL — AB (ref 0.61–1.24)
Calcium: 8.3 mg/dL — ABNORMAL LOW (ref 8.9–10.3)
GFR calc non Af Amer: 3 mL/min — ABNORMAL LOW (ref >60)
GFR, EST AFRICAN AMERICAN: 3 mL/min — AB (ref >60)
GLUCOSE: 89 mg/dL (ref 65–99)
Potassium: 4.2 mmol/L (ref 3.5–5.1)
SODIUM: 138 mmol/L (ref 135–145)
Total Bilirubin: 1.2 mg/dL (ref 0.3–1.2)
Total Protein: 6.3 g/dL — ABNORMAL LOW (ref 6.5–8.1)

## 2017-02-22 LAB — CBC
HCT: 36 % — ABNORMAL LOW (ref 39.0–52.0)
HEMOGLOBIN: 11.3 g/dL — AB (ref 13.0–17.0)
MCH: 29.2 pg (ref 26.0–34.0)
MCHC: 31.4 g/dL (ref 30.0–36.0)
MCV: 93 fL (ref 78.0–100.0)
PLATELETS: 105 10*3/uL — AB (ref 150–400)
RBC: 3.87 MIL/uL — AB (ref 4.22–5.81)
RDW: 16.8 % — ABNORMAL HIGH (ref 11.5–15.5)
WBC: 6.3 10*3/uL (ref 4.0–10.5)

## 2017-02-22 LAB — I-STAT TROPONIN, ED: Troponin i, poc: 0.06 ng/mL (ref 0.00–0.08)

## 2017-02-22 LAB — BRAIN NATRIURETIC PEPTIDE: B NATRIURETIC PEPTIDE 5: 1152.2 pg/mL — AB (ref 0.0–100.0)

## 2017-02-22 LAB — D-DIMER, QUANTITATIVE (NOT AT ARMC): D DIMER QUANT: 0.46 ug{FEU}/mL (ref 0.00–0.50)

## 2017-02-22 MED ORDER — PREDNISONE 20 MG PO TABS
40.0000 mg | ORAL_TABLET | Freq: Once | ORAL | Status: AC
Start: 2017-02-22 — End: 2017-02-22
  Administered 2017-02-22: 40 mg via ORAL
  Filled 2017-02-22: qty 2

## 2017-02-22 MED ORDER — ALTEPLASE 2 MG IJ SOLR: 2.0000 mg | Freq: Once | INTRAMUSCULAR | Status: DC | PRN

## 2017-02-22 MED ORDER — ALBUTEROL SULFATE (2.5 MG/3ML) 0.083% IN NEBU
5.0000 mg | INHALATION_SOLUTION | Freq: Once | RESPIRATORY_TRACT | Status: AC
  Administered 2017-02-22: 5 mg via RESPIRATORY_TRACT
  Filled 2017-02-22: qty 6

## 2017-02-22 MED ORDER — PANTOPRAZOLE SODIUM 40 MG PO TBEC: 40.0000 mg | DELAYED_RELEASE_TABLET | Freq: Every day | ORAL | Status: DC

## 2017-02-22 MED ORDER — HEPARIN SODIUM (PORCINE) 1000 UNIT/ML DIALYSIS
3500.0000 [IU] | Freq: Once | INTRAMUSCULAR | Status: AC
  Administered 2017-02-22: 3500 [IU] via INTRAVENOUS_CENTRAL

## 2017-02-22 MED ORDER — LORATADINE 10 MG PO TABS
5.0000 mg | ORAL_TABLET | ORAL | Status: DC
Start: 2017-02-22 — End: 2017-02-22

## 2017-02-22 MED ORDER — SODIUM CHLORIDE 0.9 % IV SOLN: 100.0000 mL | INTRAVENOUS | Status: DC | PRN

## 2017-02-22 MED ORDER — PENTAFLUOROPROP-TETRAFLUOROETH EX AERO: 1.0000 "application " | INHALATION_SPRAY | CUTANEOUS | Status: DC | PRN

## 2017-02-22 MED ORDER — HEPARIN SODIUM (PORCINE) 1000 UNIT/ML DIALYSIS: 1000.0000 [IU] | INTRAMUSCULAR | Status: DC | PRN

## 2017-02-22 MED ORDER — LIDOCAINE-PRILOCAINE 2.5-2.5 % EX CREA: 1.0000 "application " | TOPICAL_CREAM | CUTANEOUS | Status: DC | PRN

## 2017-02-22 MED ORDER — FLUTICASONE PROPIONATE 50 MCG/ACT NA SUSP: 2.0000 | Freq: Every day | NASAL | Status: DC

## 2017-02-22 MED ORDER — FUROSEMIDE 10 MG/ML IJ SOLN
40.0000 mg | Freq: Once | INTRAMUSCULAR | Status: AC
  Administered 2017-02-22: 40 mg via INTRAVENOUS
  Filled 2017-02-22: qty 4

## 2017-02-22 MED ORDER — LIDOCAINE HCL (PF) 1 % IJ SOLN: 5.0000 mL | INTRAMUSCULAR | Status: DC | PRN

## 2017-02-22 NOTE — ED Notes (Signed)
Pt given diet ginger ale.

## 2017-02-22 NOTE — H&P (Signed)
Date: 02/22/2017               Patient Name:  Jon [name] MRN: [id number]  DOB: 06-Nov-1955 Age / Sex: 61 y.o., male   PCP: Shela Leff, MD         Medical Service: Internal Medicine Teaching Service         Attending Physician: Dr. Lucious Groves, DO    First Contact: Dr. Maricela Bo Pager: [phone]  Second Contact: Dr. Benjamine Mola Pager: [phone]       After Hours (After 5p/  First Contact Pager: [phone]  weekends / holidays): Second Contact Pager: [phone]   Chief Complaint: Cough and SOB  History of Present Illness:  This is a 61 y.o. man with PMHx of ESRD on HD (MWF), HTN, HLD, chronic cough, and worsening dyspnea with exertion who is brought via EMS to the ED for evaluation of his dyspnea and cough.  Patient reports that he has had symptoms of cough for many months and is currently being evaluated for this by both his PCP and pulmonology.  He reports that symptoms have been progressively worsened over the past 3 weeks and last night at midnight he felt like he could not breath.  He usually has cough that precedes his shortness of breath but he has also noticed worsening SOB with activity that is getting worse.  When he went to his scheduled HD session this morning, he was so short of breath walking from his car to inside that he requested to go to the ED and so was sent here.  He did not receive any HD today.  He reports his dry weight is 105kg.  Today he is at 106kg.  He reports GERD-like symptoms "a couple times", rhinorrhea "off and on", orthopnea, PND, and leg swelling last weekend that is gone.  He denies CP, abdominal pain, dysuria (makes urine about 2 times per day), sore throat, sick contacts.  He reports his cough is productive of yellow or white mucus but today was pink in color.  He reports medications given to him have not been helpful and the albuterol inhaler helps some.  He had PFTs done on July 24.  Of note, he was seen by cardiology for evaluation of potential kidney  transplant yesterday at [facility].  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  Plan was for cors possible coronary angiogram and to obtain TTE.  In the ED, he was provided with Lasix, prednisone and a breathing treatment.  [facility] was called to admit patient for their dyspnea.    Meds:  Current Meds  Medication Sig  . albuterol (PROVENTIL HFA;VENTOLIN HFA) 108 (90 Base) MCG/ACT inhaler Inhale 2 puffs into the lungs every 6 (six) hours as needed for wheezing or shortness of breath.  Marland Kitchen amLODipine (NORVASC) 10 MG tablet Take 10 mg by mouth at bedtime.  Marland Kitchen aspirin EC 81 MG EC tablet Take 1 tablet (81 mg total) by mouth daily.  Marland Kitchen atorvastatin (LIPITOR) 20 MG tablet Take 1 tablet (20 mg total) by mouth daily at 6 PM.  . carvedilol (COREG) 25 MG tablet Take 1 tablet (25 mg total) by mouth 2 (two) times daily.  . famotidine (PEPCID) 20 MG tablet Take 1 tablet (20 mg total) by mouth 2 (two) times  daily as needed for heartburn or indigestion.  . hydrALAZINE (APRESOLINE) 50 MG tablet Take 1 tablet (50 mg total) by mouth 2 (two) times daily.  Marland Kitchen losartan (COZAAR) 100 MG tablet Take 1 tablet (100 mg total) by mouth at bedtime.  Marland Kitchen spironolactone (ALDACTONE) 50 MG tablet Take 1 tablet (50 mg total) by mouth daily.     Allergies: Allergies as of 02/22/2017  . (No Known Allergies)   Past Medical History:  Diagnosis Date  . Chronic kidney disease   . Dialysis patient ([place])   . GERD (gastroesophageal reflux disease)   . History of blood transfusion   . Hyperlipidemia    takes Atorvastatin daily  . Hypertension    takes Amlodipine,Losartan,and Labetalol daily  . Myocardial infarction [facility])    lived in [place] maybe 7 years ago  . Small bowel obstruction (HCC)     Family History: Hypertension, brother deceased (ESRD, drug  use)  Social History: Lives in [place]. 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 He denies tobacco, alcohol, and illicit drug use.  Review of Systems: A complete ROS was negative except as per HPI.   Physical Exam: Blood pressure (!) 169/93, pulse 76, temperature 98.1 F (36.7 C), temperature source Oral, resp. rate 18, height 6' (1.829 m), weight 234 lb (106.1 kg), SpO2 97 %. Physical Exam  Constitutional: He is oriented to person, place, and time and well-developed, well-nourished, and in no distress.  HENT:  Head: Normocephalic and atraumatic.  No obvious exudate or post-nasal drip.  Eyes: Conjunctivae and EOM are normal.  Neck: Normal range of motion. Neck supple.  Cardiovascular: Normal rate and regular rhythm.   Pulmonary/Chest: Effort normal and breath sounds normal. No respiratory distress. He has no wheezes. He has no rales.  He is saturating at 95% on RA  Abdominal: Soft. Bowel sounds are normal. There is no tenderness. There is no rebound and no guarding.  Musculoskeletal: He exhibits no edema or tenderness.  Lymphadenopathy:    He has no cervical adenopathy.  Neurological: He is alert and oriented to person, place, and time.  Skin: Skin is warm and dry.  Left UE AV fistula with thrill  Psychiatric: Mood and affect normal.     EKG: personally reviewed my interpretation is NSR, LVH, rate 75, LAD, prolonged QT  CXR:  IMPRESSION: Cardiomegaly, central vascular congestion and small volume pleuralfluid. No airspace consolidation.  LABS:  WBC 6.3, Hgb 11.3, HCT 36, Plt 105 Na 138, K 4.2, Cl 98, CO2 29, BUN 36, Creat 15.3, Gluc 89 AST 18, ALT 12, AP 49, TBIli 1.2 BNP 1152 (894 2 months ago) D-dimer negative I-stat troponin 0.06  Assessment & Plan by Problem:  Dyspnea with Exertion Cough Patient currently being evaluated in the outpatient setting for this by his PCP and pulmonology.  Had PFTs done on 7/24  which showed FEV1/FVC ratio of 77% post-bronchodilator.  FEV1 post-pronchodilator is 56% of predicted and showed a 27% change of 469mL.  TLC is 44% of predicted and DLCO is 66% predicted.  His PCP felt that symptoms may be due to GERD vs possible asthma and started him on Pepcid 20mg  BID as needed and albuterol inhaler as needed on June 28.  Seen for follow up July 24 prior to PFTs and was continued on this regimen.  Also seen in pulmonology clinic on July 19 where he was continued on PRN albuterol, scheduled for aforementioned PFTs and also a sleep study for  possible OSA.  He had a cardiac cath in 2016 with no significant CAD.  Echo that year also showed EF 65-70% with G2DD.  He vaguely reports GERD symptoms and rhinnorhea although they do seem infrequent.  His BNP is elevated and CXR does show some fluid so maybe just needs to have more volume removed with HD - PFTs indicate a reversible component to his symptoms and he reports albuterol helps a little.  PFTs also suggestive there may be a restrictive component as well. - pulmonology follow up outpatient - Continue Albuterol as needed - Discussed with nephrology who feels likely needs extended HD session for volume removal.  This will be done here in the hospital. - Repeat TTE for completeness and coronary angiography is planned as outpatient with [facility] - May need more aggressive outpatient treatment for possible GERD as well as treatment of post-nasal drip symptoms.  However, his symptoms for this seem inconsistent.  His Pepcid is ordered PRN so unsure how he has really been taking - Outpatient sleep study will be beneficial  - Follow up in [facility] next week  ESRD on HD - Dialyzes MWF, will plan for HD today in the hospital with DC plan afterwards  HTN: BP is 169/93.  This should improve with HD. He is on 5 separate agents and is frequently uncontrolled outpatient.  BP management deferred to outpatient PCP.  HLD - continue home atorvastatin 20mg   daily  Thrombocytopenia: chronic, mild at about baseline today at 105,000  FEN Fluids: None Electrolytes: monitor  Nutrition: Renal   DVT PPx: Heparin  CODE: FULL   Dispo: Discharge after HD this afternoon.  Signed: Jule Ser, DO 02/22/2017, 10:29 AM  Pager: [phone]   ADDENDUM 1:29 PM : Discussed with Dr. Jonnie Finner.  Patient will go to HD today here at the hospital and DC home afterwards with continued outpatient follow up with his pulmonology, cardiology, and PCP providers.  ED RN also notified and his inpatient bed request has been cancelled.

## 2017-02-22 NOTE — ED Triage Notes (Signed)
Pt BIB EMS from dialysis. Went to dialysis today and mentioned he had been SOB for the past 3 weeks with exertion and lying down. Per EMS,  100% on RA with clear lung sounds. Started coughing yesterday with yellowish and blood-tinged color. A &O x4.

## 2017-02-22 NOTE — Progress Notes (Signed)
61 yo ESRD pt with SOB sent from HD this am at pt's request.  No fever, prod cough or CP.  CXR showing mild / early pulm edema, no O2 in ED and sat's are OK.  Asked to see for dialysis.   Pt seen and examined.  Plan "extended" HD today/ tonight and then dc home.  Pt is stable from a resp standpoint, not on any O2 in ED.  Lungs clear, trace ankle edema, rest of exam wnl.  Based on weights will possibly need dry wt lowered vs cont same.  Plan UF goal of 5 L.    [facility] MWF 4h 55min  105.5kg  Hep 3500   LUA AVF  Kelly Splinter MD [employer] pgr [phone]   02/22/2017, 4:44 PM

## 2017-02-22 NOTE — ED Provider Notes (Signed)
[facility] DEPT Provider Note   CSN: [id number] Arrival date & time: 02/22/17  0277     History   Chief Complaint Chief Complaint  Patient presents with  . Shortness of Breath    HPI Donald Gillen is a 61 y.o. male.  HPI  61 year old male on dialysis Monday Wednesday Friday 7 today from dialysis for evaluation of dyspnea. He reports 2 week history of increasing shortness of breath. It began on exertion. He is now unable to lay flat at night. He has not noted any peripheral edema. He denies any similar symptoms in the past. He has not had fever or chills. He has a productive cough that is sometimes white and sometimes yellowish stay had some blood-tinged sputum. He denies any history of DVT, lateralized leg swelling, or PE. He reports his dry weight as being 105 kg and having maintained a normal weight for him. He is on the transplant list. He denies having any chest pain.  Patient reports being sent for long testing this week.  It appears he had PFTs obtained and is scheduled for a sleep study. Review of clinic note from primary care noted that he had been complaining of this. He had been using his rescue inhaler once a day. Past Medical History:  Diagnosis Date  . Chronic kidney disease   . Dialysis patient ([place])   . GERD (gastroesophageal reflux disease)   . History of blood transfusion   . Hyperlipidemia    takes Atorvastatin daily  . Hypertension    takes Amlodipine,Losartan,and Labetalol daily  . Myocardial infarction [facility])    lived in [place] maybe 7 years ago  . Small bowel obstruction [facility])     Patient Active Problem List   Diagnosis Date Noted  . Cough 01/24/2017  . Anemia due to chronic kidney disease   . Thrombocytopenia ([place])   . Generalized weakness 12/15/2016  . Preventative health care 10/16/2016  . Right ankle pain 09/07/2016  . Chronic anemia 05/01/2015  . ESRD (end stage renal disease) on dialysis ([place]) 05/07/2012  . HTN (hypertension) 05/07/2012     Past Surgical History:  Procedure Laterality Date  . ARTERIOVENOUS GRAFT PLACEMENT    . AV FISTULA PLACEMENT Right 10/25/2015   Procedure: ARTERIOVENOUS (AV) FISTULA CREATION RIGHT FOREARM;  Surgeon: Angelia Mould, MD;  Location: [employer];  Service: Vascular;  Laterality: Right;  . CARDIAC CATHETERIZATION N/A 05/05/2015   Procedure: Left Heart Cath and Coronary Angiography;  Surgeon: Charolette Forward, MD;  Location: [place] CV [facility];  Service: Cardiovascular;  Laterality: N/A;  . PERIPHERAL VASCULAR CATHETERIZATION N/A 08/18/2015   Procedure: Fistulagram;  Surgeon: Conrad Alford, MD;  Location: [place] [facility];  Service: Cardiovascular;  Laterality: N/A;  . PERIPHERAL VASCULAR CATHETERIZATION Right 04/19/2016   Procedure: Fistulagram;  Surgeon: Conrad Port Lions, MD;  Location: [place] [facility];  Service: Cardiovascular;  Laterality: Right;  . RESECTION OF ARTERIOVENOUS FISTULA ANEURYSM Left 02/05/2013   Procedure: REPAIR OF ANEURYSM OF LEFT ARM ARTERIOVENOUS FISTULA ;  Surgeon: Serafina Mitchell, MD;  Location: [place];  Service: Vascular;  Laterality: Left;  . RESECTION OF ARTERIOVENOUS FISTULA ANEURYSM Left 03/08/2015   Procedure: REPAIR OF LEFT ARTERIOVENOUS FISTULA PSEUDOANEURYSM;  Surgeon: Angelia Mould, MD;  Location: [place];  Service: Vascular;  Laterality: Left;  . REVISON OF ARTERIOVENOUS FISTULA Left 41/08/8784   Procedure: PLICATION OF A LARGE ANEURYSM LEFT UPPER ARM BRACHIO-CEPHALIC ARTERIOVENOUS FISTULA ;  Surgeon: Angelia Mould, MD;  Location: [facility];  Service: Vascular;  Laterality: Left;  . SMALL INTESTINE SURGERY         Home Medications    Prior to Admission medications   Medication Sig Start Date End Date Taking? Authorizing Provider  albuterol (PROVENTIL HFA;VENTOLIN HFA) 108 (90 Base) MCG/ACT inhaler Inhale 2 puffs into the lungs every 6 (six) hours as needed for wheezing or shortness of breath. 01/24/17   Shela Leff, MD  amLODipine (NORVASC) 10 MG  tablet Take 10 mg by mouth at bedtime.    [provider]  aspirin EC 81 MG EC tablet Take 1 tablet (81 mg total) by mouth daily. 05/30/13   Charolette Forward, MD  atorvastatin (LIPITOR) 20 MG tablet Take 1 tablet (20 mg total) by mouth daily at 6 PM. 05/30/13   Charolette Forward, MD  carvedilol (COREG) 25 MG tablet Take 1 tablet (25 mg total) by mouth 2 (two) times daily. 01/29/17 01/29/18  Shela Leff, MD  famotidine (PEPCID) 20 MG tablet Take 1 tablet (20 mg total) by mouth 2 (two) times daily as needed for heartburn or indigestion. 01/24/17   Shela Leff, MD  hydrALAZINE (APRESOLINE) 50 MG tablet Take 1 tablet (50 mg total) by mouth 2 (two) times daily. 01/29/17   Shela Leff, MD  losartan (COZAAR) 100 MG tablet Take 1 tablet (100 mg total) by mouth at bedtime. 05/06/15   Hongalgi, Lenis Dickinson, MD  spironolactone (ALDACTONE) 50 MG tablet Take 1 tablet (50 mg total) by mouth daily. 01/29/17   Shela Leff, MD    Family History Family History  Problem Relation Age of Onset  . Hypertension Mother   . Other Mother        varicose veins  . Diabetes Father   . Hypertension Father   . Hypertension Sister   . Other Sister        varicose veins  . Other Brother        varicose veins    Social History Social History  Substance Use Topics  . Smoking status: Never Smoker  . Smokeless tobacco: Never Used  . Alcohol use No     Allergies   Patient has no known allergies.   Review of Systems Review of Systems  Constitutional: Positive for activity change. Negative for chills, fever and unexpected weight change.  HENT: Negative.   Eyes: Negative.   Respiratory: Positive for cough and shortness of breath. Negative for chest tightness and wheezing.   Cardiovascular: Negative.  Negative for chest pain and leg swelling.  Gastrointestinal: Negative.   Endocrine: Negative.   Genitourinary: Negative.   Musculoskeletal: Negative.   Neurological: Negative.   Hematological:  Negative.   Psychiatric/Behavioral: Negative.   All other systems reviewed and are negative.    Physical Exam Updated Vital Signs BP (!) 164/90 (BP Location: Right Arm)   Pulse 75   Temp 98.1 F (36.7 C) (Oral)   Resp 16   Ht 1.829 m (6')   Wt 106.1 kg (234 lb)   SpO2 97%   BMI 31.74 kg/m   Physical Exam  Constitutional: He is oriented to person, place, and time. He appears well-developed and well-nourished.  HENT:  Head: Normocephalic and atraumatic.  Right Ear: External ear normal.  Left Ear: External ear normal.  Nose: Nose normal.  Mouth/Throat: Oropharynx is clear and moist.  Eyes: Pupils are equal, round, and reactive to light. Conjunctivae and EOM are normal.  Neck: Normal range of motion. Neck supple.  Cardiovascular: Normal rate, regular rhythm, normal heart sounds  and intact distal pulses.   Pulmonary/Chest: Effort normal and breath sounds normal. No respiratory distress. He has no wheezes. He exhibits no tenderness.  Port in place right upper chest wall nontender and nonerythematous  Abdominal: Soft. Bowel sounds are normal. He exhibits no distension and no mass. There is no tenderness. There is no guarding.  Musculoskeletal: Normal range of motion.  Left upper extremity with AV graft in place  Neurological: He is alert and oriented to person, place, and time. He has normal reflexes. He exhibits normal muscle tone. Coordination normal.  Skin: Skin is warm and dry.  Psychiatric: He has a normal mood and affect. His behavior is normal. Judgment and thought content normal.  Nursing note and vitals reviewed.    ED Treatments / Results  Labs (all labs ordered are listed, but only abnormal results are displayed) Labs Reviewed  CBC  COMPREHENSIVE METABOLIC PANEL    EKG  EKG Interpretation  Date/Time:  Friday February 22 2017 06:30:40 EDT Ventricular Rate:  75 PR Interval:    QRS Duration: 101 QT Interval:  464 QTC Calculation: 519 R Axis:   -55 Text  Interpretation:  Sinus rhythm LAD, consider left anterior fascicular block Abnormal R-wave progression, late transition Left ventricular hypertrophy Abnormal T, consider ischemia, lateral leads Prolonged QT interval Confirmed by Pattricia Boss [phone]) on 02/22/2017 7:17:46 AM Also confirmed by Pattricia Boss [phone]), editor Hattie Perch ([id number])  on 02/22/2017 8:10:08 AM       Radiology Dg Chest 2 View  Result Date: 02/22/2017 CLINICAL DATA:  Dyspnea for 3 weeks, particularly in when recumbent. EXAM: CHEST  2 VIEW COMPARISON:  12/15/2016 FINDINGS: Unchanged cardiomegaly. Moderate central vascular congestion. Small volume pleural fluid collected in the fissures and in the posterior costophrenic angles. IMPRESSION: Cardiomegaly, central vascular congestion and small volume pleural fluid. No airspace consolidation. Electronically Signed   By: Andreas Newport M.D.   On: 02/22/2017 06:56    Procedures Procedures (including critical care time)  Medications Ordered in ED Medications  albuterol (PROVENTIL) (2.5 MG/3ML) 0.083% nebulizer solution 5 mg (not administered)     Initial Impression / Assessment and Plan / ED Course  I have reviewed the triage vital signs and the nursing notes.  Pertinent labs & imaging results that were available during my care of the patient were reviewed by me and considered in my medical decision making (see chart for details).     61 year old male with history of end-stage renal disease on dialysis Monday Wednesday Friday with 2 weeks of increasing dyspnea. Workup here is consistent for CHF. Patient has elevated BNP and increased markings on chest x-Marsean Elkhatib. He has maintained the same body weight on dialysis. Patient does make some urine. He is given Lasix here in the ED. Patient would likely benefit from inpatient evaluation for volume control and cardiac evaluation. Additionally, patient has had recent spirometry tests. Pre-and post bronchodilator FVC are 2.13 and  2.51. This increase of greater than 12% would indicate some reversible airway obstructive disease. She has been using his inhaler at increased once per day Discussed with Dr.Wallace and he will see for tsb Final Clinical Impressions(s) / ED Diagnoses   Final diagnoses:  Acute congestive heart failure, unspecified heart failure type ([place])  Dyspnea, unspecified type    New Prescriptions New Prescriptions   No medications on file     Pattricia Boss, MD 02/22/17 [id number]

## 2017-02-22 NOTE — ED Notes (Signed)
Pt stated he has all of his belongings with him upon transfer to hemodialysis and states he is not missing anything. One belongings bag and shoes sent with patient upstairs.

## 2017-02-22 NOTE — ED Notes (Signed)
Patient transported to X-ray 

## 2017-02-22 NOTE — ED Notes (Signed)
Bed placement notified that pt will just need HD and then will be discharged.

## 2017-02-22 NOTE — ED Notes (Signed)
Attempted repoprt x1.

## 2017-02-23 NOTE — Progress Notes (Signed)
Pt discharged from hospital after HD tx.  Tolerated tx well. Removed 5L with no issues.  Pt instructed to go to scheduled dialysis tx on Monday at his outpt center.  No SOB upon pt discharge.

## 2017-02-25 ENCOUNTER — Telehealth: Payer: Self-pay | Admitting: Pulmonary Disease

## 2017-02-25 DIAGNOSIS — D631 Anemia in chronic kidney disease: Secondary | ICD-10-CM | POA: Diagnosis not present

## 2017-02-25 DIAGNOSIS — N186 End stage renal disease: Secondary | ICD-10-CM | POA: Diagnosis not present

## 2017-02-25 DIAGNOSIS — E119 Type 2 diabetes mellitus without complications: Secondary | ICD-10-CM | POA: Diagnosis not present

## 2017-02-25 DIAGNOSIS — D509 Iron deficiency anemia, unspecified: Secondary | ICD-10-CM | POA: Diagnosis not present

## 2017-02-25 DIAGNOSIS — N2581 Secondary hyperparathyroidism of renal origin: Secondary | ICD-10-CM | POA: Diagnosis not present

## 2017-02-25 NOTE — Telephone Encounter (Signed)
PFT 02/19/17 >> FEV1 1.93 (56%), FEV1% 77, TLC 3.31 (44%), DLCO 66%, +BD   Will have my nurse inform pt that PFT is suggestive of asthma.  If he is agreeable, then please have him try flovent 44 mcg bid, and he needs to rinse mouth after each use.  He should continue this until his next follow up visit which will be arranged after his sleep study is reviewed.

## 2017-02-25 NOTE — Discharge Summary (Signed)
Name: Jon [name] MRN: [id number] DOB: 1956-07-03 61 y.o. PCP: Shela Leff, MD  Date of Admission: 02/22/2017  6:15 AM Date of Discharge: 02/23/2017 Attending Physician: Sid Falcon, MD  Discharge Diagnosis: Principal Problem:   Dyspnea  Active Problems:   ESRD (end stage renal disease) on dialysis ([place])   HTN (hypertension)   Cough   Pulmonary edema   Discharge Medications: Allergies as of 02/23/2017   No Known Allergies     Medication List    ASK your doctor about these medications   albuterol 108 (90 Base) MCG/ACT inhaler Commonly known as:  PROVENTIL HFA;VENTOLIN HFA Inhale 2 puffs into the lungs every 6 (six) hours as needed for wheezing or shortness of breath.   amLODipine 10 MG tablet Commonly known as:  NORVASC Take 10 mg by mouth at bedtime.   aspirin 81 MG EC tablet Take 1 tablet (81 mg total) by mouth daily.   atorvastatin 20 MG tablet Commonly known as:  LIPITOR Take 1 tablet (20 mg total) by mouth daily at 6 PM.   carvedilol 25 MG tablet Commonly known as:  COREG Take 1 tablet (25 mg total) by mouth 2 (two) times daily.   famotidine 20 MG tablet Commonly known as:  PEPCID Take 1 tablet (20 mg total) by mouth 2 (two) times daily as needed for heartburn or indigestion.   hydrALAZINE 50 MG tablet Commonly known as:  APRESOLINE Take 1 tablet (50 mg total) by mouth 2 (two) times daily.   losartan 100 MG tablet Commonly known as:  COZAAR Take 1 tablet (100 mg total) by mouth at bedtime.   spironolactone 50 MG tablet Commonly known as:  ALDACTONE Take 1 tablet (50 mg total) by mouth daily.       Disposition and follow-up:   [name] was discharged from [facility] in stable condition.  At the hospital follow up visit please address:  1.  Regular dialysis, pulmonology appointment regarding PFT results, post nasal drip, gerd symptoms, sleep study  2.  Labs / imaging needed at time of follow-up: repeat TTE and  coronary angiography  3.  Pending labs/ test needing follow-up: none  Follow-up Appointments: IM clinic in [place] by problem list:    ESRD (end stage renal disease) on dialysis [facility])   Dyspnea   Pulmonary edema The patient was sent from his HD outpatient appointment with shortness of breath, orthopnea, PND, and leg swelling. He was given lasix, prednisone and breathing treatment in the ED. Nephrology agreed to take the patient for an extended dialysis session. A total of 5L were removed from the patient and the patient was subsequently discharged prior to the IMTS team seeing him during morning rounds. There were no known complications from the dialysis treatment.       HTN (hypertension)  Continue home medications and monitored for resolving blood pressure subsequent to hemodialysis.    Discharge Vitals:   BP (!) 154/88 (BP Location: Right Arm)   Pulse 79   Temp 98.3 F (36.8 C) (Oral)   Resp 18   Ht 6' (1.829 m)   Wt 235 lb 14.3 oz (107 kg)   SpO2 98%   BMI 31.99 kg/m   Pertinent Labs, Studies, and Procedures:   BNP (7/27):1152  CMP (7/27): na=138, k=4.2, bun=36, cr=15, alb=3.4, ast=18, alt  Troponin (7/27): 0.06   Discharge Instructions: The patient left prior to official discharge. The IMTS team was not able to formally discharge him.  Signed: Lars Mage, MD Internal Medicine PGY1 Pager:[phone] 02/24/2017, 8:33 PM

## 2017-02-26 DIAGNOSIS — Z992 Dependence on renal dialysis: Secondary | ICD-10-CM | POA: Diagnosis not present

## 2017-02-26 DIAGNOSIS — I120 Hypertensive chronic kidney disease with stage 5 chronic kidney disease or end stage renal disease: Secondary | ICD-10-CM | POA: Diagnosis not present

## 2017-02-26 DIAGNOSIS — N186 End stage renal disease: Secondary | ICD-10-CM | POA: Diagnosis not present

## 2017-02-26 NOTE — Progress Notes (Signed)
Internal Medicine Clinic Attending  Case discussed with Dr. Rathoreat the time of the visit. We reviewed the resident's history and exam and pertinent patient test results. I agree with the assessment, diagnosis, and plan of care documented in the resident's note.  

## 2017-02-26 NOTE — Addendum Note (Signed)
Addended by: Gilles Chiquito B on: 02/26/2017 10:06 AM   Modules accepted: Level of Service

## 2017-02-27 DIAGNOSIS — E119 Type 2 diabetes mellitus without complications: Secondary | ICD-10-CM | POA: Diagnosis not present

## 2017-02-27 DIAGNOSIS — D509 Iron deficiency anemia, unspecified: Secondary | ICD-10-CM | POA: Diagnosis not present

## 2017-02-27 DIAGNOSIS — Z23 Encounter for immunization: Secondary | ICD-10-CM | POA: Diagnosis not present

## 2017-02-27 DIAGNOSIS — D631 Anemia in chronic kidney disease: Secondary | ICD-10-CM | POA: Diagnosis not present

## 2017-02-27 DIAGNOSIS — N2581 Secondary hyperparathyroidism of renal origin: Secondary | ICD-10-CM | POA: Diagnosis not present

## 2017-02-27 DIAGNOSIS — N186 End stage renal disease: Secondary | ICD-10-CM | POA: Diagnosis not present

## 2017-03-01 ENCOUNTER — Encounter: Payer: Self-pay | Admitting: Internal Medicine

## 2017-03-01 ENCOUNTER — Ambulatory Visit: Payer: Medicare Other

## 2017-03-01 DIAGNOSIS — N2581 Secondary hyperparathyroidism of renal origin: Secondary | ICD-10-CM | POA: Diagnosis not present

## 2017-03-01 DIAGNOSIS — D631 Anemia in chronic kidney disease: Secondary | ICD-10-CM | POA: Diagnosis not present

## 2017-03-01 DIAGNOSIS — E119 Type 2 diabetes mellitus without complications: Secondary | ICD-10-CM | POA: Diagnosis not present

## 2017-03-01 DIAGNOSIS — Z23 Encounter for immunization: Secondary | ICD-10-CM | POA: Diagnosis not present

## 2017-03-01 DIAGNOSIS — D509 Iron deficiency anemia, unspecified: Secondary | ICD-10-CM | POA: Diagnosis not present

## 2017-03-01 DIAGNOSIS — N186 End stage renal disease: Secondary | ICD-10-CM | POA: Diagnosis not present

## 2017-03-04 DIAGNOSIS — E119 Type 2 diabetes mellitus without complications: Secondary | ICD-10-CM | POA: Diagnosis not present

## 2017-03-04 DIAGNOSIS — D509 Iron deficiency anemia, unspecified: Secondary | ICD-10-CM | POA: Diagnosis not present

## 2017-03-04 DIAGNOSIS — N2581 Secondary hyperparathyroidism of renal origin: Secondary | ICD-10-CM | POA: Diagnosis not present

## 2017-03-04 DIAGNOSIS — Z23 Encounter for immunization: Secondary | ICD-10-CM | POA: Diagnosis not present

## 2017-03-04 DIAGNOSIS — N186 End stage renal disease: Secondary | ICD-10-CM | POA: Diagnosis not present

## 2017-03-04 DIAGNOSIS — D631 Anemia in chronic kidney disease: Secondary | ICD-10-CM | POA: Diagnosis not present

## 2017-03-05 DIAGNOSIS — I120 Hypertensive chronic kidney disease with stage 5 chronic kidney disease or end stage renal disease: Secondary | ICD-10-CM | POA: Diagnosis not present

## 2017-03-05 DIAGNOSIS — Z01810 Encounter for preprocedural cardiovascular examination: Secondary | ICD-10-CM | POA: Diagnosis not present

## 2017-03-05 DIAGNOSIS — Z992 Dependence on renal dialysis: Secondary | ICD-10-CM | POA: Diagnosis not present

## 2017-03-05 DIAGNOSIS — N186 End stage renal disease: Secondary | ICD-10-CM | POA: Diagnosis not present

## 2017-03-05 DIAGNOSIS — Z7682 Awaiting organ transplant status: Secondary | ICD-10-CM | POA: Diagnosis not present

## 2017-03-06 ENCOUNTER — Ambulatory Visit: Payer: Medicare Other

## 2017-03-06 DIAGNOSIS — D631 Anemia in chronic kidney disease: Secondary | ICD-10-CM | POA: Diagnosis not present

## 2017-03-06 DIAGNOSIS — D509 Iron deficiency anemia, unspecified: Secondary | ICD-10-CM | POA: Diagnosis not present

## 2017-03-06 DIAGNOSIS — N2581 Secondary hyperparathyroidism of renal origin: Secondary | ICD-10-CM | POA: Diagnosis not present

## 2017-03-06 DIAGNOSIS — E119 Type 2 diabetes mellitus without complications: Secondary | ICD-10-CM | POA: Diagnosis not present

## 2017-03-06 DIAGNOSIS — Z23 Encounter for immunization: Secondary | ICD-10-CM | POA: Diagnosis not present

## 2017-03-06 DIAGNOSIS — N186 End stage renal disease: Secondary | ICD-10-CM | POA: Diagnosis not present

## 2017-03-08 DIAGNOSIS — D509 Iron deficiency anemia, unspecified: Secondary | ICD-10-CM | POA: Diagnosis not present

## 2017-03-08 DIAGNOSIS — D631 Anemia in chronic kidney disease: Secondary | ICD-10-CM | POA: Diagnosis not present

## 2017-03-08 DIAGNOSIS — E119 Type 2 diabetes mellitus without complications: Secondary | ICD-10-CM | POA: Diagnosis not present

## 2017-03-08 DIAGNOSIS — Z23 Encounter for immunization: Secondary | ICD-10-CM | POA: Diagnosis not present

## 2017-03-08 DIAGNOSIS — N2581 Secondary hyperparathyroidism of renal origin: Secondary | ICD-10-CM | POA: Diagnosis not present

## 2017-03-08 DIAGNOSIS — N186 End stage renal disease: Secondary | ICD-10-CM | POA: Diagnosis not present

## 2017-03-08 MED ORDER — FLUTICASONE PROPIONATE HFA 44 MCG/ACT IN AERO: 1.0000 | INHALATION_SPRAY | Freq: Two times a day (BID) | RESPIRATORY_TRACT | 5 refills | Status: DC

## 2017-03-08 NOTE — Telephone Encounter (Signed)
Spoke with patient. He verbalized understanding about results and inhaler. Inhaler has been sent to preferred pharmacy. Nothing else needed at time of call.

## 2017-03-09 ENCOUNTER — Other Ambulatory Visit: Payer: Self-pay | Admitting: Internal Medicine

## 2017-03-11 DIAGNOSIS — D631 Anemia in chronic kidney disease: Secondary | ICD-10-CM | POA: Diagnosis not present

## 2017-03-11 DIAGNOSIS — Z23 Encounter for immunization: Secondary | ICD-10-CM | POA: Diagnosis not present

## 2017-03-11 DIAGNOSIS — D509 Iron deficiency anemia, unspecified: Secondary | ICD-10-CM | POA: Diagnosis not present

## 2017-03-11 DIAGNOSIS — N186 End stage renal disease: Secondary | ICD-10-CM | POA: Diagnosis not present

## 2017-03-11 DIAGNOSIS — N2581 Secondary hyperparathyroidism of renal origin: Secondary | ICD-10-CM | POA: Diagnosis not present

## 2017-03-11 DIAGNOSIS — E119 Type 2 diabetes mellitus without complications: Secondary | ICD-10-CM | POA: Diagnosis not present

## 2017-03-12 DIAGNOSIS — R7309 Other abnormal glucose: Secondary | ICD-10-CM | POA: Diagnosis not present

## 2017-03-12 DIAGNOSIS — Z992 Dependence on renal dialysis: Secondary | ICD-10-CM | POA: Diagnosis not present

## 2017-03-12 DIAGNOSIS — I2510 Atherosclerotic heart disease of native coronary artery without angina pectoris: Secondary | ICD-10-CM | POA: Diagnosis not present

## 2017-03-12 DIAGNOSIS — D649 Anemia, unspecified: Secondary | ICD-10-CM | POA: Diagnosis not present

## 2017-03-12 DIAGNOSIS — N186 End stage renal disease: Secondary | ICD-10-CM | POA: Diagnosis not present

## 2017-03-12 DIAGNOSIS — I120 Hypertensive chronic kidney disease with stage 5 chronic kidney disease or end stage renal disease: Secondary | ICD-10-CM | POA: Diagnosis not present

## 2017-03-12 DIAGNOSIS — E785 Hyperlipidemia, unspecified: Secondary | ICD-10-CM | POA: Diagnosis not present

## 2017-03-13 DIAGNOSIS — D509 Iron deficiency anemia, unspecified: Secondary | ICD-10-CM | POA: Diagnosis not present

## 2017-03-13 DIAGNOSIS — E119 Type 2 diabetes mellitus without complications: Secondary | ICD-10-CM | POA: Diagnosis not present

## 2017-03-13 DIAGNOSIS — Z23 Encounter for immunization: Secondary | ICD-10-CM | POA: Diagnosis not present

## 2017-03-13 DIAGNOSIS — D631 Anemia in chronic kidney disease: Secondary | ICD-10-CM | POA: Diagnosis not present

## 2017-03-13 DIAGNOSIS — N186 End stage renal disease: Secondary | ICD-10-CM | POA: Diagnosis not present

## 2017-03-13 DIAGNOSIS — N2581 Secondary hyperparathyroidism of renal origin: Secondary | ICD-10-CM | POA: Diagnosis not present

## 2017-03-15 DIAGNOSIS — N2581 Secondary hyperparathyroidism of renal origin: Secondary | ICD-10-CM | POA: Diagnosis not present

## 2017-03-15 DIAGNOSIS — E119 Type 2 diabetes mellitus without complications: Secondary | ICD-10-CM | POA: Diagnosis not present

## 2017-03-15 DIAGNOSIS — N186 End stage renal disease: Secondary | ICD-10-CM | POA: Diagnosis not present

## 2017-03-15 DIAGNOSIS — Z23 Encounter for immunization: Secondary | ICD-10-CM | POA: Diagnosis not present

## 2017-03-15 DIAGNOSIS — D631 Anemia in chronic kidney disease: Secondary | ICD-10-CM | POA: Diagnosis not present

## 2017-03-15 DIAGNOSIS — D509 Iron deficiency anemia, unspecified: Secondary | ICD-10-CM | POA: Diagnosis not present

## 2017-03-18 DIAGNOSIS — D631 Anemia in chronic kidney disease: Secondary | ICD-10-CM | POA: Diagnosis not present

## 2017-03-18 DIAGNOSIS — D509 Iron deficiency anemia, unspecified: Secondary | ICD-10-CM | POA: Diagnosis not present

## 2017-03-18 DIAGNOSIS — Z23 Encounter for immunization: Secondary | ICD-10-CM | POA: Diagnosis not present

## 2017-03-18 DIAGNOSIS — E119 Type 2 diabetes mellitus without complications: Secondary | ICD-10-CM | POA: Diagnosis not present

## 2017-03-18 DIAGNOSIS — N2581 Secondary hyperparathyroidism of renal origin: Secondary | ICD-10-CM | POA: Diagnosis not present

## 2017-03-18 DIAGNOSIS — N186 End stage renal disease: Secondary | ICD-10-CM | POA: Diagnosis not present

## 2017-03-20 DIAGNOSIS — Z23 Encounter for immunization: Secondary | ICD-10-CM | POA: Diagnosis not present

## 2017-03-20 DIAGNOSIS — N186 End stage renal disease: Secondary | ICD-10-CM | POA: Diagnosis not present

## 2017-03-20 DIAGNOSIS — E119 Type 2 diabetes mellitus without complications: Secondary | ICD-10-CM | POA: Diagnosis not present

## 2017-03-20 DIAGNOSIS — N2581 Secondary hyperparathyroidism of renal origin: Secondary | ICD-10-CM | POA: Diagnosis not present

## 2017-03-20 DIAGNOSIS — D631 Anemia in chronic kidney disease: Secondary | ICD-10-CM | POA: Diagnosis not present

## 2017-03-20 DIAGNOSIS — D509 Iron deficiency anemia, unspecified: Secondary | ICD-10-CM | POA: Diagnosis not present

## 2017-03-22 DIAGNOSIS — D631 Anemia in chronic kidney disease: Secondary | ICD-10-CM | POA: Diagnosis not present

## 2017-03-22 DIAGNOSIS — D509 Iron deficiency anemia, unspecified: Secondary | ICD-10-CM | POA: Diagnosis not present

## 2017-03-22 DIAGNOSIS — Z23 Encounter for immunization: Secondary | ICD-10-CM | POA: Diagnosis not present

## 2017-03-22 DIAGNOSIS — E119 Type 2 diabetes mellitus without complications: Secondary | ICD-10-CM | POA: Diagnosis not present

## 2017-03-22 DIAGNOSIS — N186 End stage renal disease: Secondary | ICD-10-CM | POA: Diagnosis not present

## 2017-03-22 DIAGNOSIS — N2581 Secondary hyperparathyroidism of renal origin: Secondary | ICD-10-CM | POA: Diagnosis not present

## 2017-03-25 DIAGNOSIS — D509 Iron deficiency anemia, unspecified: Secondary | ICD-10-CM | POA: Diagnosis not present

## 2017-03-25 DIAGNOSIS — N186 End stage renal disease: Secondary | ICD-10-CM | POA: Diagnosis not present

## 2017-03-25 DIAGNOSIS — N2581 Secondary hyperparathyroidism of renal origin: Secondary | ICD-10-CM | POA: Diagnosis not present

## 2017-03-25 DIAGNOSIS — E119 Type 2 diabetes mellitus without complications: Secondary | ICD-10-CM | POA: Diagnosis not present

## 2017-03-25 DIAGNOSIS — Z23 Encounter for immunization: Secondary | ICD-10-CM | POA: Diagnosis not present

## 2017-03-25 DIAGNOSIS — D631 Anemia in chronic kidney disease: Secondary | ICD-10-CM | POA: Diagnosis not present

## 2017-03-27 DIAGNOSIS — Z23 Encounter for immunization: Secondary | ICD-10-CM | POA: Diagnosis not present

## 2017-03-27 DIAGNOSIS — D631 Anemia in chronic kidney disease: Secondary | ICD-10-CM | POA: Diagnosis not present

## 2017-03-27 DIAGNOSIS — E119 Type 2 diabetes mellitus without complications: Secondary | ICD-10-CM | POA: Diagnosis not present

## 2017-03-27 DIAGNOSIS — N186 End stage renal disease: Secondary | ICD-10-CM | POA: Diagnosis not present

## 2017-03-27 DIAGNOSIS — N2581 Secondary hyperparathyroidism of renal origin: Secondary | ICD-10-CM | POA: Diagnosis not present

## 2017-03-27 DIAGNOSIS — D509 Iron deficiency anemia, unspecified: Secondary | ICD-10-CM | POA: Diagnosis not present

## 2017-03-29 DIAGNOSIS — E119 Type 2 diabetes mellitus without complications: Secondary | ICD-10-CM | POA: Diagnosis not present

## 2017-03-29 DIAGNOSIS — D509 Iron deficiency anemia, unspecified: Secondary | ICD-10-CM | POA: Diagnosis not present

## 2017-03-29 DIAGNOSIS — D631 Anemia in chronic kidney disease: Secondary | ICD-10-CM | POA: Diagnosis not present

## 2017-03-29 DIAGNOSIS — N2581 Secondary hyperparathyroidism of renal origin: Secondary | ICD-10-CM | POA: Diagnosis not present

## 2017-03-29 DIAGNOSIS — N186 End stage renal disease: Secondary | ICD-10-CM | POA: Diagnosis not present

## 2017-03-29 DIAGNOSIS — I120 Hypertensive chronic kidney disease with stage 5 chronic kidney disease or end stage renal disease: Secondary | ICD-10-CM | POA: Diagnosis not present

## 2017-03-29 DIAGNOSIS — Z992 Dependence on renal dialysis: Secondary | ICD-10-CM | POA: Diagnosis not present

## 2017-03-29 DIAGNOSIS — Z23 Encounter for immunization: Secondary | ICD-10-CM | POA: Diagnosis not present

## 2017-04-01 DIAGNOSIS — N2581 Secondary hyperparathyroidism of renal origin: Secondary | ICD-10-CM | POA: Diagnosis not present

## 2017-04-01 DIAGNOSIS — D509 Iron deficiency anemia, unspecified: Secondary | ICD-10-CM | POA: Diagnosis not present

## 2017-04-01 DIAGNOSIS — N186 End stage renal disease: Secondary | ICD-10-CM | POA: Diagnosis not present

## 2017-04-01 DIAGNOSIS — E119 Type 2 diabetes mellitus without complications: Secondary | ICD-10-CM | POA: Diagnosis not present

## 2017-04-03 DIAGNOSIS — N186 End stage renal disease: Secondary | ICD-10-CM | POA: Diagnosis not present

## 2017-04-03 DIAGNOSIS — N2581 Secondary hyperparathyroidism of renal origin: Secondary | ICD-10-CM | POA: Diagnosis not present

## 2017-04-03 DIAGNOSIS — D509 Iron deficiency anemia, unspecified: Secondary | ICD-10-CM | POA: Diagnosis not present

## 2017-04-03 DIAGNOSIS — E119 Type 2 diabetes mellitus without complications: Secondary | ICD-10-CM | POA: Diagnosis not present

## 2017-04-05 DIAGNOSIS — N186 End stage renal disease: Secondary | ICD-10-CM | POA: Diagnosis not present

## 2017-04-05 DIAGNOSIS — N2581 Secondary hyperparathyroidism of renal origin: Secondary | ICD-10-CM | POA: Diagnosis not present

## 2017-04-05 DIAGNOSIS — E119 Type 2 diabetes mellitus without complications: Secondary | ICD-10-CM | POA: Diagnosis not present

## 2017-04-05 DIAGNOSIS — D509 Iron deficiency anemia, unspecified: Secondary | ICD-10-CM | POA: Diagnosis not present

## 2017-04-08 DIAGNOSIS — D509 Iron deficiency anemia, unspecified: Secondary | ICD-10-CM | POA: Diagnosis not present

## 2017-04-08 DIAGNOSIS — N2581 Secondary hyperparathyroidism of renal origin: Secondary | ICD-10-CM | POA: Diagnosis not present

## 2017-04-08 DIAGNOSIS — N186 End stage renal disease: Secondary | ICD-10-CM | POA: Diagnosis not present

## 2017-04-08 DIAGNOSIS — E119 Type 2 diabetes mellitus without complications: Secondary | ICD-10-CM | POA: Diagnosis not present

## 2017-04-10 DIAGNOSIS — D509 Iron deficiency anemia, unspecified: Secondary | ICD-10-CM | POA: Diagnosis not present

## 2017-04-10 DIAGNOSIS — E119 Type 2 diabetes mellitus without complications: Secondary | ICD-10-CM | POA: Diagnosis not present

## 2017-04-10 DIAGNOSIS — N2581 Secondary hyperparathyroidism of renal origin: Secondary | ICD-10-CM | POA: Diagnosis not present

## 2017-04-10 DIAGNOSIS — N186 End stage renal disease: Secondary | ICD-10-CM | POA: Diagnosis not present

## 2017-04-12 DIAGNOSIS — D509 Iron deficiency anemia, unspecified: Secondary | ICD-10-CM | POA: Diagnosis not present

## 2017-04-12 DIAGNOSIS — N186 End stage renal disease: Secondary | ICD-10-CM | POA: Diagnosis not present

## 2017-04-12 DIAGNOSIS — N2581 Secondary hyperparathyroidism of renal origin: Secondary | ICD-10-CM | POA: Diagnosis not present

## 2017-04-12 DIAGNOSIS — E119 Type 2 diabetes mellitus without complications: Secondary | ICD-10-CM | POA: Diagnosis not present

## 2017-04-15 DIAGNOSIS — D509 Iron deficiency anemia, unspecified: Secondary | ICD-10-CM | POA: Diagnosis not present

## 2017-04-15 DIAGNOSIS — N2581 Secondary hyperparathyroidism of renal origin: Secondary | ICD-10-CM | POA: Diagnosis not present

## 2017-04-15 DIAGNOSIS — N186 End stage renal disease: Secondary | ICD-10-CM | POA: Diagnosis not present

## 2017-04-15 DIAGNOSIS — E119 Type 2 diabetes mellitus without complications: Secondary | ICD-10-CM | POA: Diagnosis not present

## 2017-04-16 ENCOUNTER — Encounter: Payer: Medicare Other | Admitting: Internal Medicine

## 2017-04-16 ENCOUNTER — Encounter: Payer: Self-pay | Admitting: Internal Medicine

## 2017-04-16 ENCOUNTER — Ambulatory Visit (HOSPITAL_BASED_OUTPATIENT_CLINIC_OR_DEPARTMENT_OTHER): Payer: Medicare Other | Attending: Pulmonary Disease

## 2017-04-17 DIAGNOSIS — E119 Type 2 diabetes mellitus without complications: Secondary | ICD-10-CM | POA: Diagnosis not present

## 2017-04-17 DIAGNOSIS — D509 Iron deficiency anemia, unspecified: Secondary | ICD-10-CM | POA: Diagnosis not present

## 2017-04-17 DIAGNOSIS — N2581 Secondary hyperparathyroidism of renal origin: Secondary | ICD-10-CM | POA: Diagnosis not present

## 2017-04-17 DIAGNOSIS — N186 End stage renal disease: Secondary | ICD-10-CM | POA: Diagnosis not present

## 2017-04-19 ENCOUNTER — Encounter: Payer: Self-pay | Admitting: Internal Medicine

## 2017-04-19 DIAGNOSIS — N2581 Secondary hyperparathyroidism of renal origin: Secondary | ICD-10-CM | POA: Diagnosis not present

## 2017-04-19 DIAGNOSIS — D509 Iron deficiency anemia, unspecified: Secondary | ICD-10-CM | POA: Diagnosis not present

## 2017-04-19 DIAGNOSIS — N186 End stage renal disease: Secondary | ICD-10-CM | POA: Diagnosis not present

## 2017-04-19 DIAGNOSIS — E119 Type 2 diabetes mellitus without complications: Secondary | ICD-10-CM | POA: Diagnosis not present

## 2017-04-22 DIAGNOSIS — D509 Iron deficiency anemia, unspecified: Secondary | ICD-10-CM | POA: Diagnosis not present

## 2017-04-22 DIAGNOSIS — N186 End stage renal disease: Secondary | ICD-10-CM | POA: Diagnosis not present

## 2017-04-22 DIAGNOSIS — E119 Type 2 diabetes mellitus without complications: Secondary | ICD-10-CM | POA: Diagnosis not present

## 2017-04-22 DIAGNOSIS — N2581 Secondary hyperparathyroidism of renal origin: Secondary | ICD-10-CM | POA: Diagnosis not present

## 2017-04-24 DIAGNOSIS — D509 Iron deficiency anemia, unspecified: Secondary | ICD-10-CM | POA: Diagnosis not present

## 2017-04-24 DIAGNOSIS — N186 End stage renal disease: Secondary | ICD-10-CM | POA: Diagnosis not present

## 2017-04-24 DIAGNOSIS — N2581 Secondary hyperparathyroidism of renal origin: Secondary | ICD-10-CM | POA: Diagnosis not present

## 2017-04-24 DIAGNOSIS — E119 Type 2 diabetes mellitus without complications: Secondary | ICD-10-CM | POA: Diagnosis not present

## 2017-04-26 DIAGNOSIS — N186 End stage renal disease: Secondary | ICD-10-CM | POA: Diagnosis not present

## 2017-04-26 DIAGNOSIS — N2581 Secondary hyperparathyroidism of renal origin: Secondary | ICD-10-CM | POA: Diagnosis not present

## 2017-04-26 DIAGNOSIS — E119 Type 2 diabetes mellitus without complications: Secondary | ICD-10-CM | POA: Diagnosis not present

## 2017-04-26 DIAGNOSIS — D509 Iron deficiency anemia, unspecified: Secondary | ICD-10-CM | POA: Diagnosis not present

## 2017-04-28 DIAGNOSIS — I120 Hypertensive chronic kidney disease with stage 5 chronic kidney disease or end stage renal disease: Secondary | ICD-10-CM | POA: Diagnosis not present

## 2017-04-28 DIAGNOSIS — N186 End stage renal disease: Secondary | ICD-10-CM | POA: Diagnosis not present

## 2017-04-28 DIAGNOSIS — Z992 Dependence on renal dialysis: Secondary | ICD-10-CM | POA: Diagnosis not present

## 2017-04-29 DIAGNOSIS — E119 Type 2 diabetes mellitus without complications: Secondary | ICD-10-CM | POA: Diagnosis not present

## 2017-04-29 DIAGNOSIS — D631 Anemia in chronic kidney disease: Secondary | ICD-10-CM | POA: Diagnosis not present

## 2017-04-29 DIAGNOSIS — D509 Iron deficiency anemia, unspecified: Secondary | ICD-10-CM | POA: Diagnosis not present

## 2017-04-29 DIAGNOSIS — N186 End stage renal disease: Secondary | ICD-10-CM | POA: Diagnosis not present

## 2017-04-29 DIAGNOSIS — N2581 Secondary hyperparathyroidism of renal origin: Secondary | ICD-10-CM | POA: Diagnosis not present

## 2017-05-01 DIAGNOSIS — E119 Type 2 diabetes mellitus without complications: Secondary | ICD-10-CM | POA: Diagnosis not present

## 2017-05-01 DIAGNOSIS — N2581 Secondary hyperparathyroidism of renal origin: Secondary | ICD-10-CM | POA: Diagnosis not present

## 2017-05-01 DIAGNOSIS — N186 End stage renal disease: Secondary | ICD-10-CM | POA: Diagnosis not present

## 2017-05-01 DIAGNOSIS — D631 Anemia in chronic kidney disease: Secondary | ICD-10-CM | POA: Diagnosis not present

## 2017-05-01 DIAGNOSIS — D509 Iron deficiency anemia, unspecified: Secondary | ICD-10-CM | POA: Diagnosis not present

## 2017-05-03 DIAGNOSIS — N186 End stage renal disease: Secondary | ICD-10-CM | POA: Diagnosis not present

## 2017-05-03 DIAGNOSIS — D509 Iron deficiency anemia, unspecified: Secondary | ICD-10-CM | POA: Diagnosis not present

## 2017-05-03 DIAGNOSIS — N2581 Secondary hyperparathyroidism of renal origin: Secondary | ICD-10-CM | POA: Diagnosis not present

## 2017-05-03 DIAGNOSIS — D631 Anemia in chronic kidney disease: Secondary | ICD-10-CM | POA: Diagnosis not present

## 2017-05-03 DIAGNOSIS — E119 Type 2 diabetes mellitus without complications: Secondary | ICD-10-CM | POA: Diagnosis not present

## 2017-05-06 DIAGNOSIS — E119 Type 2 diabetes mellitus without complications: Secondary | ICD-10-CM | POA: Diagnosis not present

## 2017-05-06 DIAGNOSIS — N186 End stage renal disease: Secondary | ICD-10-CM | POA: Diagnosis not present

## 2017-05-06 DIAGNOSIS — D631 Anemia in chronic kidney disease: Secondary | ICD-10-CM | POA: Diagnosis not present

## 2017-05-06 DIAGNOSIS — D509 Iron deficiency anemia, unspecified: Secondary | ICD-10-CM | POA: Diagnosis not present

## 2017-05-06 DIAGNOSIS — N2581 Secondary hyperparathyroidism of renal origin: Secondary | ICD-10-CM | POA: Diagnosis not present

## 2017-05-08 DIAGNOSIS — N2581 Secondary hyperparathyroidism of renal origin: Secondary | ICD-10-CM | POA: Diagnosis not present

## 2017-05-08 DIAGNOSIS — N186 End stage renal disease: Secondary | ICD-10-CM | POA: Diagnosis not present

## 2017-05-08 DIAGNOSIS — E119 Type 2 diabetes mellitus without complications: Secondary | ICD-10-CM | POA: Diagnosis not present

## 2017-05-08 DIAGNOSIS — D631 Anemia in chronic kidney disease: Secondary | ICD-10-CM | POA: Diagnosis not present

## 2017-05-08 DIAGNOSIS — D509 Iron deficiency anemia, unspecified: Secondary | ICD-10-CM | POA: Diagnosis not present

## 2017-05-09 DIAGNOSIS — I219 Acute myocardial infarction, unspecified: Secondary | ICD-10-CM | POA: Insufficient documentation

## 2017-05-09 DIAGNOSIS — I770 Arteriovenous fistula, acquired: Secondary | ICD-10-CM | POA: Insufficient documentation

## 2017-05-09 DIAGNOSIS — E782 Mixed hyperlipidemia: Secondary | ICD-10-CM | POA: Diagnosis present

## 2017-05-09 DIAGNOSIS — K219 Gastro-esophageal reflux disease without esophagitis: Secondary | ICD-10-CM | POA: Insufficient documentation

## 2017-05-09 DIAGNOSIS — Z9289 Personal history of other medical treatment: Secondary | ICD-10-CM | POA: Insufficient documentation

## 2017-05-09 DIAGNOSIS — K222 Esophageal obstruction: Secondary | ICD-10-CM | POA: Insufficient documentation

## 2017-05-09 DIAGNOSIS — I2510 Atherosclerotic heart disease of native coronary artery without angina pectoris: Secondary | ICD-10-CM | POA: Insufficient documentation

## 2017-05-09 DIAGNOSIS — Z9889 Other specified postprocedural states: Secondary | ICD-10-CM | POA: Insufficient documentation

## 2017-05-09 DIAGNOSIS — N2889 Other specified disorders of kidney and ureter: Secondary | ICD-10-CM | POA: Insufficient documentation

## 2017-05-10 DIAGNOSIS — N2581 Secondary hyperparathyroidism of renal origin: Secondary | ICD-10-CM | POA: Diagnosis not present

## 2017-05-10 DIAGNOSIS — E119 Type 2 diabetes mellitus without complications: Secondary | ICD-10-CM | POA: Diagnosis not present

## 2017-05-10 DIAGNOSIS — D509 Iron deficiency anemia, unspecified: Secondary | ICD-10-CM | POA: Diagnosis not present

## 2017-05-10 DIAGNOSIS — N186 End stage renal disease: Secondary | ICD-10-CM | POA: Diagnosis not present

## 2017-05-10 DIAGNOSIS — D631 Anemia in chronic kidney disease: Secondary | ICD-10-CM | POA: Diagnosis not present

## 2017-05-13 DIAGNOSIS — D631 Anemia in chronic kidney disease: Secondary | ICD-10-CM | POA: Diagnosis not present

## 2017-05-13 DIAGNOSIS — E119 Type 2 diabetes mellitus without complications: Secondary | ICD-10-CM | POA: Diagnosis not present

## 2017-05-13 DIAGNOSIS — N2581 Secondary hyperparathyroidism of renal origin: Secondary | ICD-10-CM | POA: Diagnosis not present

## 2017-05-13 DIAGNOSIS — D509 Iron deficiency anemia, unspecified: Secondary | ICD-10-CM | POA: Diagnosis not present

## 2017-05-13 DIAGNOSIS — N186 End stage renal disease: Secondary | ICD-10-CM | POA: Diagnosis not present

## 2017-05-15 DIAGNOSIS — N2581 Secondary hyperparathyroidism of renal origin: Secondary | ICD-10-CM | POA: Diagnosis not present

## 2017-05-15 DIAGNOSIS — E119 Type 2 diabetes mellitus without complications: Secondary | ICD-10-CM | POA: Diagnosis not present

## 2017-05-15 DIAGNOSIS — D509 Iron deficiency anemia, unspecified: Secondary | ICD-10-CM | POA: Diagnosis not present

## 2017-05-15 DIAGNOSIS — N186 End stage renal disease: Secondary | ICD-10-CM | POA: Diagnosis not present

## 2017-05-15 DIAGNOSIS — D631 Anemia in chronic kidney disease: Secondary | ICD-10-CM | POA: Diagnosis not present

## 2017-05-17 DIAGNOSIS — N2581 Secondary hyperparathyroidism of renal origin: Secondary | ICD-10-CM | POA: Diagnosis not present

## 2017-05-17 DIAGNOSIS — D509 Iron deficiency anemia, unspecified: Secondary | ICD-10-CM | POA: Diagnosis not present

## 2017-05-17 DIAGNOSIS — E119 Type 2 diabetes mellitus without complications: Secondary | ICD-10-CM | POA: Diagnosis not present

## 2017-05-17 DIAGNOSIS — D631 Anemia in chronic kidney disease: Secondary | ICD-10-CM | POA: Diagnosis not present

## 2017-05-17 DIAGNOSIS — N186 End stage renal disease: Secondary | ICD-10-CM | POA: Diagnosis not present

## 2017-05-20 DIAGNOSIS — N186 End stage renal disease: Secondary | ICD-10-CM | POA: Diagnosis not present

## 2017-05-20 DIAGNOSIS — D631 Anemia in chronic kidney disease: Secondary | ICD-10-CM | POA: Diagnosis not present

## 2017-05-20 DIAGNOSIS — N2581 Secondary hyperparathyroidism of renal origin: Secondary | ICD-10-CM | POA: Diagnosis not present

## 2017-05-20 DIAGNOSIS — E119 Type 2 diabetes mellitus without complications: Secondary | ICD-10-CM | POA: Diagnosis not present

## 2017-05-20 DIAGNOSIS — D509 Iron deficiency anemia, unspecified: Secondary | ICD-10-CM | POA: Diagnosis not present

## 2017-05-22 DIAGNOSIS — D509 Iron deficiency anemia, unspecified: Secondary | ICD-10-CM | POA: Diagnosis not present

## 2017-05-22 DIAGNOSIS — N186 End stage renal disease: Secondary | ICD-10-CM | POA: Diagnosis not present

## 2017-05-22 DIAGNOSIS — D631 Anemia in chronic kidney disease: Secondary | ICD-10-CM | POA: Diagnosis not present

## 2017-05-22 DIAGNOSIS — N2581 Secondary hyperparathyroidism of renal origin: Secondary | ICD-10-CM | POA: Diagnosis not present

## 2017-05-22 DIAGNOSIS — E1151 Type 2 diabetes mellitus with diabetic peripheral angiopathy without gangrene: Secondary | ICD-10-CM | POA: Diagnosis not present

## 2017-05-22 DIAGNOSIS — E119 Type 2 diabetes mellitus without complications: Secondary | ICD-10-CM | POA: Diagnosis not present

## 2017-05-24 DIAGNOSIS — N2581 Secondary hyperparathyroidism of renal origin: Secondary | ICD-10-CM | POA: Diagnosis not present

## 2017-05-24 DIAGNOSIS — N186 End stage renal disease: Secondary | ICD-10-CM | POA: Diagnosis not present

## 2017-05-24 DIAGNOSIS — E119 Type 2 diabetes mellitus without complications: Secondary | ICD-10-CM | POA: Diagnosis not present

## 2017-05-24 DIAGNOSIS — D509 Iron deficiency anemia, unspecified: Secondary | ICD-10-CM | POA: Diagnosis not present

## 2017-05-24 DIAGNOSIS — D631 Anemia in chronic kidney disease: Secondary | ICD-10-CM | POA: Diagnosis not present

## 2017-05-27 DIAGNOSIS — D631 Anemia in chronic kidney disease: Secondary | ICD-10-CM | POA: Diagnosis not present

## 2017-05-27 DIAGNOSIS — N186 End stage renal disease: Secondary | ICD-10-CM | POA: Diagnosis not present

## 2017-05-27 DIAGNOSIS — N2581 Secondary hyperparathyroidism of renal origin: Secondary | ICD-10-CM | POA: Diagnosis not present

## 2017-05-27 DIAGNOSIS — E119 Type 2 diabetes mellitus without complications: Secondary | ICD-10-CM | POA: Diagnosis not present

## 2017-05-27 DIAGNOSIS — D509 Iron deficiency anemia, unspecified: Secondary | ICD-10-CM | POA: Diagnosis not present

## 2017-05-29 DIAGNOSIS — Z992 Dependence on renal dialysis: Secondary | ICD-10-CM | POA: Diagnosis not present

## 2017-05-29 DIAGNOSIS — N2581 Secondary hyperparathyroidism of renal origin: Secondary | ICD-10-CM | POA: Diagnosis not present

## 2017-05-29 DIAGNOSIS — D509 Iron deficiency anemia, unspecified: Secondary | ICD-10-CM | POA: Diagnosis not present

## 2017-05-29 DIAGNOSIS — I120 Hypertensive chronic kidney disease with stage 5 chronic kidney disease or end stage renal disease: Secondary | ICD-10-CM | POA: Diagnosis not present

## 2017-05-29 DIAGNOSIS — N186 End stage renal disease: Secondary | ICD-10-CM | POA: Diagnosis not present

## 2017-05-29 DIAGNOSIS — D631 Anemia in chronic kidney disease: Secondary | ICD-10-CM | POA: Diagnosis not present

## 2017-05-29 DIAGNOSIS — E119 Type 2 diabetes mellitus without complications: Secondary | ICD-10-CM | POA: Diagnosis not present

## 2017-05-31 DIAGNOSIS — N186 End stage renal disease: Secondary | ICD-10-CM | POA: Diagnosis not present

## 2017-05-31 DIAGNOSIS — E119 Type 2 diabetes mellitus without complications: Secondary | ICD-10-CM | POA: Diagnosis not present

## 2017-05-31 DIAGNOSIS — D631 Anemia in chronic kidney disease: Secondary | ICD-10-CM | POA: Diagnosis not present

## 2017-05-31 DIAGNOSIS — D509 Iron deficiency anemia, unspecified: Secondary | ICD-10-CM | POA: Diagnosis not present

## 2017-05-31 DIAGNOSIS — N2581 Secondary hyperparathyroidism of renal origin: Secondary | ICD-10-CM | POA: Diagnosis not present

## 2017-06-03 DIAGNOSIS — D509 Iron deficiency anemia, unspecified: Secondary | ICD-10-CM | POA: Diagnosis not present

## 2017-06-03 DIAGNOSIS — D631 Anemia in chronic kidney disease: Secondary | ICD-10-CM | POA: Diagnosis not present

## 2017-06-03 DIAGNOSIS — E119 Type 2 diabetes mellitus without complications: Secondary | ICD-10-CM | POA: Diagnosis not present

## 2017-06-03 DIAGNOSIS — N2581 Secondary hyperparathyroidism of renal origin: Secondary | ICD-10-CM | POA: Diagnosis not present

## 2017-06-03 DIAGNOSIS — N186 End stage renal disease: Secondary | ICD-10-CM | POA: Diagnosis not present

## 2017-06-05 DIAGNOSIS — E119 Type 2 diabetes mellitus without complications: Secondary | ICD-10-CM | POA: Diagnosis not present

## 2017-06-05 DIAGNOSIS — N2581 Secondary hyperparathyroidism of renal origin: Secondary | ICD-10-CM | POA: Diagnosis not present

## 2017-06-05 DIAGNOSIS — N186 End stage renal disease: Secondary | ICD-10-CM | POA: Diagnosis not present

## 2017-06-05 DIAGNOSIS — D631 Anemia in chronic kidney disease: Secondary | ICD-10-CM | POA: Diagnosis not present

## 2017-06-05 DIAGNOSIS — D509 Iron deficiency anemia, unspecified: Secondary | ICD-10-CM | POA: Diagnosis not present

## 2017-06-06 ENCOUNTER — Ambulatory Visit: Payer: Medicare Other | Admitting: Family Medicine

## 2017-06-07 DIAGNOSIS — D631 Anemia in chronic kidney disease: Secondary | ICD-10-CM | POA: Diagnosis not present

## 2017-06-07 DIAGNOSIS — N186 End stage renal disease: Secondary | ICD-10-CM | POA: Diagnosis not present

## 2017-06-07 DIAGNOSIS — D509 Iron deficiency anemia, unspecified: Secondary | ICD-10-CM | POA: Diagnosis not present

## 2017-06-07 DIAGNOSIS — N2581 Secondary hyperparathyroidism of renal origin: Secondary | ICD-10-CM | POA: Diagnosis not present

## 2017-06-07 DIAGNOSIS — E119 Type 2 diabetes mellitus without complications: Secondary | ICD-10-CM | POA: Diagnosis not present

## 2017-06-10 DIAGNOSIS — N2581 Secondary hyperparathyroidism of renal origin: Secondary | ICD-10-CM | POA: Diagnosis not present

## 2017-06-10 DIAGNOSIS — D631 Anemia in chronic kidney disease: Secondary | ICD-10-CM | POA: Diagnosis not present

## 2017-06-10 DIAGNOSIS — N186 End stage renal disease: Secondary | ICD-10-CM | POA: Diagnosis not present

## 2017-06-10 DIAGNOSIS — E119 Type 2 diabetes mellitus without complications: Secondary | ICD-10-CM | POA: Diagnosis not present

## 2017-06-10 DIAGNOSIS — D509 Iron deficiency anemia, unspecified: Secondary | ICD-10-CM | POA: Diagnosis not present

## 2017-06-12 DIAGNOSIS — D509 Iron deficiency anemia, unspecified: Secondary | ICD-10-CM | POA: Diagnosis not present

## 2017-06-12 DIAGNOSIS — D631 Anemia in chronic kidney disease: Secondary | ICD-10-CM | POA: Diagnosis not present

## 2017-06-12 DIAGNOSIS — E119 Type 2 diabetes mellitus without complications: Secondary | ICD-10-CM | POA: Diagnosis not present

## 2017-06-12 DIAGNOSIS — N186 End stage renal disease: Secondary | ICD-10-CM | POA: Diagnosis not present

## 2017-06-12 DIAGNOSIS — N2581 Secondary hyperparathyroidism of renal origin: Secondary | ICD-10-CM | POA: Diagnosis not present

## 2017-06-14 DIAGNOSIS — E119 Type 2 diabetes mellitus without complications: Secondary | ICD-10-CM | POA: Diagnosis not present

## 2017-06-14 DIAGNOSIS — N2581 Secondary hyperparathyroidism of renal origin: Secondary | ICD-10-CM | POA: Diagnosis not present

## 2017-06-14 DIAGNOSIS — N186 End stage renal disease: Secondary | ICD-10-CM | POA: Diagnosis not present

## 2017-06-14 DIAGNOSIS — D509 Iron deficiency anemia, unspecified: Secondary | ICD-10-CM | POA: Diagnosis not present

## 2017-06-14 DIAGNOSIS — D631 Anemia in chronic kidney disease: Secondary | ICD-10-CM | POA: Diagnosis not present

## 2017-06-16 DIAGNOSIS — E119 Type 2 diabetes mellitus without complications: Secondary | ICD-10-CM | POA: Diagnosis not present

## 2017-06-16 DIAGNOSIS — N186 End stage renal disease: Secondary | ICD-10-CM | POA: Diagnosis not present

## 2017-06-16 DIAGNOSIS — D509 Iron deficiency anemia, unspecified: Secondary | ICD-10-CM | POA: Diagnosis not present

## 2017-06-16 DIAGNOSIS — D631 Anemia in chronic kidney disease: Secondary | ICD-10-CM | POA: Diagnosis not present

## 2017-06-16 DIAGNOSIS — N2581 Secondary hyperparathyroidism of renal origin: Secondary | ICD-10-CM | POA: Diagnosis not present

## 2017-06-18 DIAGNOSIS — E119 Type 2 diabetes mellitus without complications: Secondary | ICD-10-CM | POA: Diagnosis not present

## 2017-06-18 DIAGNOSIS — D631 Anemia in chronic kidney disease: Secondary | ICD-10-CM | POA: Diagnosis not present

## 2017-06-18 DIAGNOSIS — N186 End stage renal disease: Secondary | ICD-10-CM | POA: Diagnosis not present

## 2017-06-18 DIAGNOSIS — N2581 Secondary hyperparathyroidism of renal origin: Secondary | ICD-10-CM | POA: Diagnosis not present

## 2017-06-18 DIAGNOSIS — D509 Iron deficiency anemia, unspecified: Secondary | ICD-10-CM | POA: Diagnosis not present

## 2017-06-21 DIAGNOSIS — D509 Iron deficiency anemia, unspecified: Secondary | ICD-10-CM | POA: Diagnosis not present

## 2017-06-21 DIAGNOSIS — E119 Type 2 diabetes mellitus without complications: Secondary | ICD-10-CM | POA: Diagnosis not present

## 2017-06-21 DIAGNOSIS — N186 End stage renal disease: Secondary | ICD-10-CM | POA: Diagnosis not present

## 2017-06-21 DIAGNOSIS — D631 Anemia in chronic kidney disease: Secondary | ICD-10-CM | POA: Diagnosis not present

## 2017-06-21 DIAGNOSIS — N2581 Secondary hyperparathyroidism of renal origin: Secondary | ICD-10-CM | POA: Diagnosis not present

## 2017-06-24 DIAGNOSIS — D631 Anemia in chronic kidney disease: Secondary | ICD-10-CM | POA: Diagnosis not present

## 2017-06-24 DIAGNOSIS — E119 Type 2 diabetes mellitus without complications: Secondary | ICD-10-CM | POA: Diagnosis not present

## 2017-06-24 DIAGNOSIS — N186 End stage renal disease: Secondary | ICD-10-CM | POA: Diagnosis not present

## 2017-06-24 DIAGNOSIS — D509 Iron deficiency anemia, unspecified: Secondary | ICD-10-CM | POA: Diagnosis not present

## 2017-06-24 DIAGNOSIS — N2581 Secondary hyperparathyroidism of renal origin: Secondary | ICD-10-CM | POA: Diagnosis not present

## 2017-06-26 DIAGNOSIS — N186 End stage renal disease: Secondary | ICD-10-CM | POA: Diagnosis not present

## 2017-06-26 DIAGNOSIS — D631 Anemia in chronic kidney disease: Secondary | ICD-10-CM | POA: Diagnosis not present

## 2017-06-26 DIAGNOSIS — E119 Type 2 diabetes mellitus without complications: Secondary | ICD-10-CM | POA: Diagnosis not present

## 2017-06-26 DIAGNOSIS — N2581 Secondary hyperparathyroidism of renal origin: Secondary | ICD-10-CM | POA: Diagnosis not present

## 2017-06-26 DIAGNOSIS — D509 Iron deficiency anemia, unspecified: Secondary | ICD-10-CM | POA: Diagnosis not present

## 2017-06-28 DIAGNOSIS — I120 Hypertensive chronic kidney disease with stage 5 chronic kidney disease or end stage renal disease: Secondary | ICD-10-CM | POA: Diagnosis not present

## 2017-06-28 DIAGNOSIS — Z992 Dependence on renal dialysis: Secondary | ICD-10-CM | POA: Diagnosis not present

## 2017-06-28 DIAGNOSIS — N2581 Secondary hyperparathyroidism of renal origin: Secondary | ICD-10-CM | POA: Diagnosis not present

## 2017-06-28 DIAGNOSIS — D631 Anemia in chronic kidney disease: Secondary | ICD-10-CM | POA: Diagnosis not present

## 2017-06-28 DIAGNOSIS — E119 Type 2 diabetes mellitus without complications: Secondary | ICD-10-CM | POA: Diagnosis not present

## 2017-06-28 DIAGNOSIS — N186 End stage renal disease: Secondary | ICD-10-CM | POA: Diagnosis not present

## 2017-06-28 DIAGNOSIS — D509 Iron deficiency anemia, unspecified: Secondary | ICD-10-CM | POA: Diagnosis not present

## 2017-06-29 DIAGNOSIS — I120 Hypertensive chronic kidney disease with stage 5 chronic kidney disease or end stage renal disease: Secondary | ICD-10-CM | POA: Diagnosis not present

## 2017-06-29 DIAGNOSIS — Z992 Dependence on renal dialysis: Secondary | ICD-10-CM | POA: Diagnosis not present

## 2017-06-29 DIAGNOSIS — N186 End stage renal disease: Secondary | ICD-10-CM | POA: Diagnosis not present

## 2017-07-01 DIAGNOSIS — D631 Anemia in chronic kidney disease: Secondary | ICD-10-CM | POA: Diagnosis not present

## 2017-07-01 DIAGNOSIS — N186 End stage renal disease: Secondary | ICD-10-CM | POA: Diagnosis not present

## 2017-07-01 DIAGNOSIS — N2581 Secondary hyperparathyroidism of renal origin: Secondary | ICD-10-CM | POA: Diagnosis not present

## 2017-07-03 DIAGNOSIS — D631 Anemia in chronic kidney disease: Secondary | ICD-10-CM | POA: Diagnosis not present

## 2017-07-03 DIAGNOSIS — N2581 Secondary hyperparathyroidism of renal origin: Secondary | ICD-10-CM | POA: Diagnosis not present

## 2017-07-03 DIAGNOSIS — N186 End stage renal disease: Secondary | ICD-10-CM | POA: Diagnosis not present

## 2017-07-05 DIAGNOSIS — D631 Anemia in chronic kidney disease: Secondary | ICD-10-CM | POA: Diagnosis not present

## 2017-07-05 DIAGNOSIS — N2581 Secondary hyperparathyroidism of renal origin: Secondary | ICD-10-CM | POA: Diagnosis not present

## 2017-07-05 DIAGNOSIS — N186 End stage renal disease: Secondary | ICD-10-CM | POA: Diagnosis not present

## 2017-07-08 DIAGNOSIS — N186 End stage renal disease: Secondary | ICD-10-CM | POA: Diagnosis not present

## 2017-07-08 DIAGNOSIS — N2581 Secondary hyperparathyroidism of renal origin: Secondary | ICD-10-CM | POA: Diagnosis not present

## 2017-07-08 DIAGNOSIS — D631 Anemia in chronic kidney disease: Secondary | ICD-10-CM | POA: Diagnosis not present

## 2017-07-10 DIAGNOSIS — D631 Anemia in chronic kidney disease: Secondary | ICD-10-CM | POA: Diagnosis not present

## 2017-07-10 DIAGNOSIS — N186 End stage renal disease: Secondary | ICD-10-CM | POA: Diagnosis not present

## 2017-07-10 DIAGNOSIS — N2581 Secondary hyperparathyroidism of renal origin: Secondary | ICD-10-CM | POA: Diagnosis not present

## 2017-07-12 DIAGNOSIS — N186 End stage renal disease: Secondary | ICD-10-CM | POA: Diagnosis not present

## 2017-07-12 DIAGNOSIS — D631 Anemia in chronic kidney disease: Secondary | ICD-10-CM | POA: Diagnosis not present

## 2017-07-12 DIAGNOSIS — N2581 Secondary hyperparathyroidism of renal origin: Secondary | ICD-10-CM | POA: Diagnosis not present

## 2017-07-15 DIAGNOSIS — T8612 Kidney transplant failure: Secondary | ICD-10-CM | POA: Diagnosis not present

## 2017-07-15 DIAGNOSIS — Z01818 Encounter for other preprocedural examination: Secondary | ICD-10-CM | POA: Diagnosis not present

## 2017-07-15 DIAGNOSIS — E782 Mixed hyperlipidemia: Secondary | ICD-10-CM | POA: Diagnosis present

## 2017-07-15 DIAGNOSIS — R109 Unspecified abdominal pain: Secondary | ICD-10-CM | POA: Diagnosis not present

## 2017-07-15 DIAGNOSIS — K9189 Other postprocedural complications and disorders of digestive system: Secondary | ICD-10-CM | POA: Diagnosis not present

## 2017-07-15 DIAGNOSIS — R944 Abnormal results of kidney function studies: Secondary | ICD-10-CM | POA: Diagnosis not present

## 2017-07-15 DIAGNOSIS — I10 Essential (primary) hypertension: Secondary | ICD-10-CM | POA: Diagnosis not present

## 2017-07-15 DIAGNOSIS — I252 Old myocardial infarction: Secondary | ICD-10-CM | POA: Diagnosis not present

## 2017-07-15 DIAGNOSIS — E785 Hyperlipidemia, unspecified: Secondary | ICD-10-CM | POA: Diagnosis not present

## 2017-07-15 DIAGNOSIS — Z992 Dependence on renal dialysis: Secondary | ICD-10-CM | POA: Diagnosis not present

## 2017-07-15 DIAGNOSIS — J9811 Atelectasis: Secondary | ICD-10-CM | POA: Diagnosis not present

## 2017-07-15 DIAGNOSIS — E871 Hypo-osmolality and hyponatremia: Secondary | ICD-10-CM | POA: Diagnosis not present

## 2017-07-15 DIAGNOSIS — I444 Left anterior fascicular block: Secondary | ICD-10-CM | POA: Diagnosis not present

## 2017-07-15 DIAGNOSIS — N186 End stage renal disease: Secondary | ICD-10-CM | POA: Diagnosis not present

## 2017-07-15 DIAGNOSIS — J90 Pleural effusion, not elsewhere classified: Secondary | ICD-10-CM | POA: Diagnosis not present

## 2017-07-15 DIAGNOSIS — K567 Ileus, unspecified: Secondary | ICD-10-CM | POA: Diagnosis not present

## 2017-07-15 DIAGNOSIS — T80319A ABO incompatibility with hemolytic transfusion reaction, unspecified, initial encounter: Secondary | ICD-10-CM | POA: Diagnosis not present

## 2017-07-15 DIAGNOSIS — I120 Hypertensive chronic kidney disease with stage 5 chronic kidney disease or end stage renal disease: Secondary | ICD-10-CM | POA: Diagnosis not present

## 2017-07-15 DIAGNOSIS — N2581 Secondary hyperparathyroidism of renal origin: Secondary | ICD-10-CM | POA: Diagnosis not present

## 2017-07-15 DIAGNOSIS — N185 Chronic kidney disease, stage 5: Secondary | ICD-10-CM | POA: Diagnosis not present

## 2017-07-15 DIAGNOSIS — D62 Acute posthemorrhagic anemia: Secondary | ICD-10-CM | POA: Diagnosis not present

## 2017-07-15 DIAGNOSIS — Z4822 Encounter for aftercare following kidney transplant: Secondary | ICD-10-CM | POA: Diagnosis not present

## 2017-07-15 DIAGNOSIS — E1122 Type 2 diabetes mellitus with diabetic chronic kidney disease: Secondary | ICD-10-CM | POA: Diagnosis present

## 2017-07-15 DIAGNOSIS — I2510 Atherosclerotic heart disease of native coronary artery without angina pectoris: Secondary | ICD-10-CM | POA: Diagnosis not present

## 2017-07-15 DIAGNOSIS — Z5181 Encounter for therapeutic drug level monitoring: Secondary | ICD-10-CM | POA: Diagnosis not present

## 2017-07-15 DIAGNOSIS — E875 Hyperkalemia: Secondary | ICD-10-CM | POA: Diagnosis not present

## 2017-07-15 DIAGNOSIS — D631 Anemia in chronic kidney disease: Secondary | ICD-10-CM | POA: Diagnosis not present

## 2017-07-15 DIAGNOSIS — K219 Gastro-esophageal reflux disease without esophagitis: Secondary | ICD-10-CM | POA: Diagnosis not present

## 2017-07-15 DIAGNOSIS — Z79899 Other long term (current) drug therapy: Secondary | ICD-10-CM | POA: Diagnosis not present

## 2017-07-15 DIAGNOSIS — Z940 Kidney transplant status: Secondary | ICD-10-CM | POA: Diagnosis not present

## 2017-07-16 ENCOUNTER — Ambulatory Visit: Payer: Medicare Other | Admitting: Family Medicine

## 2017-07-16 DIAGNOSIS — R768 Other specified abnormal immunological findings in serum: Secondary | ICD-10-CM | POA: Insufficient documentation

## 2017-07-16 DIAGNOSIS — D849 Immunodeficiency, unspecified: Secondary | ICD-10-CM | POA: Insufficient documentation

## 2017-07-16 MED ORDER — INSULIN LISPRO 100 UNIT/ML ~~LOC~~ SOLN
2.00 | SUBCUTANEOUS | Status: DC
Start: 2017-07-16 — End: 2017-07-16

## 2017-07-16 MED ORDER — BISACODYL 10 MG RE SUPP
10.00 mg | RECTAL | Status: DC
Start: 2017-07-17 — End: 2017-07-16

## 2017-07-16 MED ORDER — SULFAMETHOXAZOLE-TRIMETHOPRIM 400-80 MG PO TABS
1.00 | ORAL_TABLET | ORAL | Status: DC
Start: 2017-07-17 — End: 2017-07-16

## 2017-07-16 MED ORDER — ONDANSETRON HCL 4 MG/2ML IJ SOLN
4.00 mg | INTRAMUSCULAR | Status: DC
Start: ? — End: 2017-07-16

## 2017-07-16 MED ORDER — PHENOL 1.4 % MT LIQD
1.00 | OROMUCOSAL | Status: DC
Start: ? — End: 2017-07-16

## 2017-07-16 MED ORDER — GENERIC EXTERNAL MEDICATION
100.00 mg | Status: DC
Start: 2017-07-16 — End: 2017-07-16

## 2017-07-16 MED ORDER — MUPIROCIN 2 % EX OINT
TOPICAL_OINTMENT | CUTANEOUS | Status: DC
Start: 2017-07-16 — End: 2017-07-16

## 2017-07-16 MED ORDER — PANTOPRAZOLE SODIUM 40 MG PO TBEC
40.00 mg | DELAYED_RELEASE_TABLET | ORAL | Status: DC
Start: 2017-07-17 — End: 2017-07-16

## 2017-07-16 MED ORDER — MYCOPHENOLATE SODIUM 360 MG PO TBEC
720.00 mg | DELAYED_RELEASE_TABLET | ORAL | Status: DC
Start: 2017-07-16 — End: 2017-07-16

## 2017-07-16 MED ORDER — CARVEDILOL 12.5 MG PO TABS
12.50 mg | ORAL_TABLET | ORAL | Status: DC
Start: 2017-07-16 — End: 2017-07-16

## 2017-07-16 MED ORDER — ACETAMINOPHEN 325 MG PO TABS
325.00 mg | ORAL_TABLET | ORAL | Status: DC
Start: ? — End: 2017-07-16

## 2017-07-16 MED ORDER — TACROLIMUS 1 MG PO CAPS
2.00 mg | ORAL_CAPSULE | ORAL | Status: DC
Start: 2017-07-16 — End: 2017-07-16

## 2017-07-16 MED ORDER — FLUCONAZOLE 50 MG PO TABS
50.00 mg | ORAL_TABLET | ORAL | Status: DC
Start: 2017-07-17 — End: 2017-07-16

## 2017-07-16 MED ORDER — HYDROCODONE-ACETAMINOPHEN 5-325 MG PO TABS
1.00 | ORAL_TABLET | ORAL | Status: DC
Start: ? — End: 2017-07-16

## 2017-07-16 MED ORDER — DEXTROSE 10 % IV SOLN
125.00 mL | INTRAVENOUS | Status: DC
Start: ? — End: 2017-07-16

## 2017-07-16 MED ORDER — AMLODIPINE BESYLATE 5 MG PO TABS
10.00 mg | ORAL_TABLET | ORAL | Status: DC
Start: 2017-07-17 — End: 2017-07-16

## 2017-07-16 MED ORDER — LABETALOL HCL 5 MG/ML IV SOLN
20.00 mg | INTRAVENOUS | Status: DC
Start: ? — End: 2017-07-16

## 2017-07-16 MED ORDER — ASPIRIN EC 81 MG PO TBEC
81.00 mg | DELAYED_RELEASE_TABLET | ORAL | Status: DC
Start: 2017-07-17 — End: 2017-07-16

## 2017-07-16 MED ORDER — PREDNISONE 20 MG PO TABS
20.00 mg | ORAL_TABLET | ORAL | Status: DC
Start: 2017-07-17 — End: 2017-07-16

## 2017-07-22 DIAGNOSIS — Z4822 Encounter for aftercare following kidney transplant: Secondary | ICD-10-CM | POA: Diagnosis not present

## 2017-07-22 DIAGNOSIS — T8619 Other complication of kidney transplant: Secondary | ICD-10-CM | POA: Diagnosis not present

## 2017-07-22 DIAGNOSIS — Z7952 Long term (current) use of systemic steroids: Secondary | ICD-10-CM | POA: Diagnosis not present

## 2017-07-22 DIAGNOSIS — E871 Hypo-osmolality and hyponatremia: Secondary | ICD-10-CM | POA: Diagnosis not present

## 2017-07-22 DIAGNOSIS — Z79899 Other long term (current) drug therapy: Secondary | ICD-10-CM | POA: Diagnosis not present

## 2017-07-22 DIAGNOSIS — Z792 Long term (current) use of antibiotics: Secondary | ICD-10-CM | POA: Diagnosis not present

## 2017-07-22 DIAGNOSIS — I252 Old myocardial infarction: Secondary | ICD-10-CM | POA: Diagnosis not present

## 2017-07-22 DIAGNOSIS — I10 Essential (primary) hypertension: Secondary | ICD-10-CM | POA: Diagnosis not present

## 2017-07-22 DIAGNOSIS — Z940 Kidney transplant status: Secondary | ICD-10-CM | POA: Diagnosis not present

## 2017-07-22 DIAGNOSIS — Z5181 Encounter for therapeutic drug level monitoring: Secondary | ICD-10-CM | POA: Diagnosis not present

## 2017-07-24 DIAGNOSIS — E782 Mixed hyperlipidemia: Secondary | ICD-10-CM | POA: Diagnosis not present

## 2017-07-24 DIAGNOSIS — D649 Anemia, unspecified: Secondary | ICD-10-CM | POA: Diagnosis not present

## 2017-07-24 DIAGNOSIS — Z4822 Encounter for aftercare following kidney transplant: Secondary | ICD-10-CM | POA: Diagnosis not present

## 2017-07-24 DIAGNOSIS — Z940 Kidney transplant status: Secondary | ICD-10-CM | POA: Diagnosis not present

## 2017-07-24 DIAGNOSIS — I10 Essential (primary) hypertension: Secondary | ICD-10-CM | POA: Diagnosis not present

## 2017-07-24 DIAGNOSIS — D899 Disorder involving the immune mechanism, unspecified: Secondary | ICD-10-CM | POA: Diagnosis not present

## 2017-07-24 DIAGNOSIS — Z9889 Other specified postprocedural states: Secondary | ICD-10-CM | POA: Diagnosis not present

## 2017-07-26 DIAGNOSIS — Z9889 Other specified postprocedural states: Secondary | ICD-10-CM | POA: Diagnosis not present

## 2017-07-26 DIAGNOSIS — Z4822 Encounter for aftercare following kidney transplant: Secondary | ICD-10-CM | POA: Diagnosis not present

## 2017-07-26 DIAGNOSIS — I10 Essential (primary) hypertension: Secondary | ICD-10-CM | POA: Diagnosis not present

## 2017-07-26 DIAGNOSIS — Z7682 Awaiting organ transplant status: Secondary | ICD-10-CM | POA: Diagnosis not present

## 2017-07-26 DIAGNOSIS — Z940 Kidney transplant status: Secondary | ICD-10-CM | POA: Diagnosis not present

## 2017-07-26 DIAGNOSIS — D899 Disorder involving the immune mechanism, unspecified: Secondary | ICD-10-CM | POA: Diagnosis not present

## 2017-07-26 DIAGNOSIS — D649 Anemia, unspecified: Secondary | ICD-10-CM | POA: Diagnosis not present

## 2017-07-29 DIAGNOSIS — Z7982 Long term (current) use of aspirin: Secondary | ICD-10-CM | POA: Diagnosis not present

## 2017-07-29 DIAGNOSIS — Z940 Kidney transplant status: Secondary | ICD-10-CM | POA: Diagnosis not present

## 2017-07-29 DIAGNOSIS — Z4822 Encounter for aftercare following kidney transplant: Secondary | ICD-10-CM | POA: Diagnosis not present

## 2017-07-29 DIAGNOSIS — Z79899 Other long term (current) drug therapy: Secondary | ICD-10-CM | POA: Diagnosis not present

## 2017-07-29 DIAGNOSIS — D631 Anemia in chronic kidney disease: Secondary | ICD-10-CM | POA: Diagnosis not present

## 2017-07-29 DIAGNOSIS — I120 Hypertensive chronic kidney disease with stage 5 chronic kidney disease or end stage renal disease: Secondary | ICD-10-CM | POA: Diagnosis not present

## 2017-07-29 DIAGNOSIS — Z992 Dependence on renal dialysis: Secondary | ICD-10-CM | POA: Diagnosis not present

## 2017-07-29 DIAGNOSIS — N186 End stage renal disease: Secondary | ICD-10-CM | POA: Diagnosis not present

## 2017-08-01 DIAGNOSIS — I10 Essential (primary) hypertension: Secondary | ICD-10-CM | POA: Diagnosis not present

## 2017-08-01 DIAGNOSIS — E871 Hypo-osmolality and hyponatremia: Secondary | ICD-10-CM | POA: Diagnosis not present

## 2017-08-01 DIAGNOSIS — D8989 Other specified disorders involving the immune mechanism, not elsewhere classified: Secondary | ICD-10-CM | POA: Diagnosis not present

## 2017-08-01 DIAGNOSIS — N186 End stage renal disease: Secondary | ICD-10-CM | POA: Diagnosis not present

## 2017-08-01 DIAGNOSIS — Z792 Long term (current) use of antibiotics: Secondary | ICD-10-CM | POA: Diagnosis not present

## 2017-08-01 DIAGNOSIS — Z79899 Other long term (current) drug therapy: Secondary | ICD-10-CM | POA: Diagnosis not present

## 2017-08-01 DIAGNOSIS — I120 Hypertensive chronic kidney disease with stage 5 chronic kidney disease or end stage renal disease: Secondary | ICD-10-CM | POA: Diagnosis not present

## 2017-08-01 DIAGNOSIS — E878 Other disorders of electrolyte and fluid balance, not elsewhere classified: Secondary | ICD-10-CM | POA: Diagnosis not present

## 2017-08-01 DIAGNOSIS — Z7952 Long term (current) use of systemic steroids: Secondary | ICD-10-CM | POA: Diagnosis not present

## 2017-08-01 DIAGNOSIS — Z940 Kidney transplant status: Secondary | ICD-10-CM | POA: Diagnosis not present

## 2017-08-01 DIAGNOSIS — Z992 Dependence on renal dialysis: Secondary | ICD-10-CM | POA: Diagnosis not present

## 2017-08-01 DIAGNOSIS — D649 Anemia, unspecified: Secondary | ICD-10-CM | POA: Diagnosis not present

## 2017-08-05 DIAGNOSIS — D899 Disorder involving the immune mechanism, unspecified: Secondary | ICD-10-CM | POA: Diagnosis not present

## 2017-08-05 DIAGNOSIS — R0601 Orthopnea: Secondary | ICD-10-CM | POA: Diagnosis not present

## 2017-08-05 DIAGNOSIS — I120 Hypertensive chronic kidney disease with stage 5 chronic kidney disease or end stage renal disease: Secondary | ICD-10-CM | POA: Diagnosis not present

## 2017-08-05 DIAGNOSIS — I2510 Atherosclerotic heart disease of native coronary artery without angina pectoris: Secondary | ICD-10-CM | POA: Diagnosis not present

## 2017-08-05 DIAGNOSIS — E872 Acidosis: Secondary | ICD-10-CM | POA: Diagnosis not present

## 2017-08-05 DIAGNOSIS — N186 End stage renal disease: Secondary | ICD-10-CM | POA: Diagnosis not present

## 2017-08-05 DIAGNOSIS — D649 Anemia, unspecified: Secondary | ICD-10-CM | POA: Diagnosis not present

## 2017-08-05 DIAGNOSIS — D8989 Other specified disorders involving the immune mechanism, not elsewhere classified: Secondary | ICD-10-CM | POA: Diagnosis not present

## 2017-08-05 DIAGNOSIS — Z940 Kidney transplant status: Secondary | ICD-10-CM | POA: Diagnosis not present

## 2017-08-05 DIAGNOSIS — Z79899 Other long term (current) drug therapy: Secondary | ICD-10-CM | POA: Diagnosis not present

## 2017-08-05 DIAGNOSIS — E875 Hyperkalemia: Secondary | ICD-10-CM | POA: Diagnosis not present

## 2017-08-05 DIAGNOSIS — I10 Essential (primary) hypertension: Secondary | ICD-10-CM | POA: Diagnosis not present

## 2017-08-08 DIAGNOSIS — I252 Old myocardial infarction: Secondary | ICD-10-CM | POA: Diagnosis not present

## 2017-08-08 DIAGNOSIS — Z7982 Long term (current) use of aspirin: Secondary | ICD-10-CM | POA: Diagnosis not present

## 2017-08-08 DIAGNOSIS — I2510 Atherosclerotic heart disease of native coronary artery without angina pectoris: Secondary | ICD-10-CM | POA: Diagnosis not present

## 2017-08-08 DIAGNOSIS — D8989 Other specified disorders involving the immune mechanism, not elsewhere classified: Secondary | ICD-10-CM | POA: Diagnosis not present

## 2017-08-08 DIAGNOSIS — R0601 Orthopnea: Secondary | ICD-10-CM | POA: Diagnosis not present

## 2017-08-08 DIAGNOSIS — Z992 Dependence on renal dialysis: Secondary | ICD-10-CM | POA: Diagnosis not present

## 2017-08-08 DIAGNOSIS — E65 Localized adiposity: Secondary | ICD-10-CM | POA: Diagnosis not present

## 2017-08-08 DIAGNOSIS — I313 Pericardial effusion (noninflammatory): Secondary | ICD-10-CM | POA: Diagnosis not present

## 2017-08-08 DIAGNOSIS — I708 Atherosclerosis of other arteries: Secondary | ICD-10-CM | POA: Diagnosis not present

## 2017-08-08 DIAGNOSIS — Z7952 Long term (current) use of systemic steroids: Secondary | ICD-10-CM | POA: Diagnosis not present

## 2017-08-08 DIAGNOSIS — D631 Anemia in chronic kidney disease: Secondary | ICD-10-CM | POA: Diagnosis not present

## 2017-08-08 DIAGNOSIS — E869 Volume depletion, unspecified: Secondary | ICD-10-CM | POA: Diagnosis not present

## 2017-08-08 DIAGNOSIS — Z79899 Other long term (current) drug therapy: Secondary | ICD-10-CM | POA: Diagnosis not present

## 2017-08-08 DIAGNOSIS — I120 Hypertensive chronic kidney disease with stage 5 chronic kidney disease or end stage renal disease: Secondary | ICD-10-CM | POA: Diagnosis not present

## 2017-08-08 DIAGNOSIS — Z792 Long term (current) use of antibiotics: Secondary | ICD-10-CM | POA: Diagnosis not present

## 2017-08-08 DIAGNOSIS — I10 Essential (primary) hypertension: Secondary | ICD-10-CM | POA: Diagnosis not present

## 2017-08-08 DIAGNOSIS — E782 Mixed hyperlipidemia: Secondary | ICD-10-CM | POA: Diagnosis not present

## 2017-08-08 DIAGNOSIS — Z4822 Encounter for aftercare following kidney transplant: Secondary | ICD-10-CM | POA: Diagnosis not present

## 2017-08-08 DIAGNOSIS — N186 End stage renal disease: Secondary | ICD-10-CM | POA: Diagnosis not present

## 2017-08-08 DIAGNOSIS — E872 Acidosis: Secondary | ICD-10-CM | POA: Diagnosis not present

## 2017-08-08 DIAGNOSIS — Z940 Kidney transplant status: Secondary | ICD-10-CM | POA: Diagnosis not present

## 2017-08-08 DIAGNOSIS — Z7983 Long term (current) use of bisphosphonates: Secondary | ICD-10-CM | POA: Diagnosis not present

## 2017-08-08 DIAGNOSIS — E871 Hypo-osmolality and hyponatremia: Secondary | ICD-10-CM | POA: Diagnosis not present

## 2017-08-08 DIAGNOSIS — E878 Other disorders of electrolyte and fluid balance, not elsewhere classified: Secondary | ICD-10-CM | POA: Diagnosis not present

## 2017-08-08 DIAGNOSIS — D899 Disorder involving the immune mechanism, unspecified: Secondary | ICD-10-CM | POA: Diagnosis not present

## 2017-08-08 DIAGNOSIS — K219 Gastro-esophageal reflux disease without esophagitis: Secondary | ICD-10-CM | POA: Diagnosis not present

## 2017-08-08 DIAGNOSIS — E875 Hyperkalemia: Secondary | ICD-10-CM | POA: Diagnosis not present

## 2017-08-08 DIAGNOSIS — I517 Cardiomegaly: Secondary | ICD-10-CM | POA: Diagnosis not present

## 2017-08-08 DIAGNOSIS — D649 Anemia, unspecified: Secondary | ICD-10-CM | POA: Diagnosis not present

## 2017-08-09 DIAGNOSIS — Z79899 Other long term (current) drug therapy: Secondary | ICD-10-CM | POA: Diagnosis not present

## 2017-08-09 DIAGNOSIS — Z5181 Encounter for therapeutic drug level monitoring: Secondary | ICD-10-CM | POA: Diagnosis not present

## 2017-08-09 DIAGNOSIS — D899 Disorder involving the immune mechanism, unspecified: Secondary | ICD-10-CM | POA: Diagnosis not present

## 2017-08-09 DIAGNOSIS — I2510 Atherosclerotic heart disease of native coronary artery without angina pectoris: Secondary | ICD-10-CM | POA: Diagnosis not present

## 2017-08-09 DIAGNOSIS — Z940 Kidney transplant status: Secondary | ICD-10-CM | POA: Diagnosis not present

## 2017-08-09 DIAGNOSIS — Z4822 Encounter for aftercare following kidney transplant: Secondary | ICD-10-CM | POA: Diagnosis not present

## 2017-08-12 DIAGNOSIS — I10 Essential (primary) hypertension: Secondary | ICD-10-CM | POA: Diagnosis not present

## 2017-08-12 DIAGNOSIS — Z955 Presence of coronary angioplasty implant and graft: Secondary | ICD-10-CM | POA: Diagnosis not present

## 2017-08-12 DIAGNOSIS — Z4822 Encounter for aftercare following kidney transplant: Secondary | ICD-10-CM | POA: Diagnosis not present

## 2017-08-12 DIAGNOSIS — L299 Pruritus, unspecified: Secondary | ICD-10-CM | POA: Diagnosis not present

## 2017-08-12 DIAGNOSIS — Z79899 Other long term (current) drug therapy: Secondary | ICD-10-CM | POA: Diagnosis not present

## 2017-08-12 DIAGNOSIS — Z940 Kidney transplant status: Secondary | ICD-10-CM | POA: Diagnosis not present

## 2017-08-12 DIAGNOSIS — E872 Acidosis: Secondary | ICD-10-CM | POA: Diagnosis not present

## 2017-08-12 DIAGNOSIS — D649 Anemia, unspecified: Secondary | ICD-10-CM | POA: Diagnosis not present

## 2017-08-12 DIAGNOSIS — I252 Old myocardial infarction: Secondary | ICD-10-CM | POA: Diagnosis not present

## 2017-08-12 DIAGNOSIS — Z992 Dependence on renal dialysis: Secondary | ICD-10-CM | POA: Diagnosis not present

## 2017-08-12 DIAGNOSIS — Z7952 Long term (current) use of systemic steroids: Secondary | ICD-10-CM | POA: Diagnosis not present

## 2017-08-12 DIAGNOSIS — D8989 Other specified disorders involving the immune mechanism, not elsewhere classified: Secondary | ICD-10-CM | POA: Diagnosis not present

## 2017-08-12 DIAGNOSIS — I2510 Atherosclerotic heart disease of native coronary artery without angina pectoris: Secondary | ICD-10-CM | POA: Diagnosis not present

## 2017-08-12 DIAGNOSIS — E875 Hyperkalemia: Secondary | ICD-10-CM | POA: Diagnosis not present

## 2017-08-15 DIAGNOSIS — Z8679 Personal history of other diseases of the circulatory system: Secondary | ICD-10-CM | POA: Diagnosis not present

## 2017-08-15 DIAGNOSIS — Z7952 Long term (current) use of systemic steroids: Secondary | ICD-10-CM | POA: Diagnosis not present

## 2017-08-15 DIAGNOSIS — I10 Essential (primary) hypertension: Secondary | ICD-10-CM | POA: Diagnosis not present

## 2017-08-15 DIAGNOSIS — D8989 Other specified disorders involving the immune mechanism, not elsewhere classified: Secondary | ICD-10-CM | POA: Diagnosis not present

## 2017-08-15 DIAGNOSIS — E878 Other disorders of electrolyte and fluid balance, not elsewhere classified: Secondary | ICD-10-CM | POA: Diagnosis not present

## 2017-08-15 DIAGNOSIS — Z792 Long term (current) use of antibiotics: Secondary | ICD-10-CM | POA: Diagnosis not present

## 2017-08-15 DIAGNOSIS — Z466 Encounter for fitting and adjustment of urinary device: Secondary | ICD-10-CM | POA: Diagnosis not present

## 2017-08-15 DIAGNOSIS — E869 Volume depletion, unspecified: Secondary | ICD-10-CM | POA: Diagnosis not present

## 2017-08-15 DIAGNOSIS — D649 Anemia, unspecified: Secondary | ICD-10-CM | POA: Diagnosis not present

## 2017-08-15 DIAGNOSIS — I252 Old myocardial infarction: Secondary | ICD-10-CM | POA: Diagnosis not present

## 2017-08-15 DIAGNOSIS — Z940 Kidney transplant status: Secondary | ICD-10-CM | POA: Diagnosis not present

## 2017-08-15 DIAGNOSIS — Z79899 Other long term (current) drug therapy: Secondary | ICD-10-CM | POA: Diagnosis not present

## 2017-08-15 DIAGNOSIS — L299 Pruritus, unspecified: Secondary | ICD-10-CM | POA: Diagnosis not present

## 2017-08-19 ENCOUNTER — Other Ambulatory Visit: Payer: Self-pay

## 2017-08-19 ENCOUNTER — Encounter: Payer: Self-pay | Admitting: Family Medicine

## 2017-08-19 ENCOUNTER — Ambulatory Visit (INDEPENDENT_AMBULATORY_CARE_PROVIDER_SITE_OTHER): Payer: Medicare Other | Admitting: Family Medicine

## 2017-08-19 VITALS — BP 130/72 | HR 88 | Temp 98.3°F | Ht 72.0 in | Wt 231.0 lb

## 2017-08-19 DIAGNOSIS — Z940 Kidney transplant status: Secondary | ICD-10-CM

## 2017-08-19 DIAGNOSIS — Z7689 Persons encountering health services in other specified circumstances: Secondary | ICD-10-CM | POA: Diagnosis present

## 2017-08-19 NOTE — Patient Instructions (Signed)
It was great meeting you today Jon [name]. I am glad that your recent kidney transplant has gone very well and that it has helped improve your life significantly. From a physical exam standpoint everything looks and sounds great. As I am now your PCP please let your pharmacy know about refill requests, and I will be prescribing those. For your kidney transplant, please contact [facility] for all questions regarding that. No lab work needed today as your recently had all of that work done at [facility]. If an issue arises please let me know. Otherwise I will see you back in one year for another annual physical.

## 2017-08-20 DIAGNOSIS — Z4822 Encounter for aftercare following kidney transplant: Secondary | ICD-10-CM | POA: Diagnosis not present

## 2017-08-20 DIAGNOSIS — Z01818 Encounter for other preprocedural examination: Secondary | ICD-10-CM | POA: Diagnosis not present

## 2017-08-20 DIAGNOSIS — D899 Disorder involving the immune mechanism, unspecified: Secondary | ICD-10-CM | POA: Diagnosis not present

## 2017-08-20 DIAGNOSIS — Z79899 Other long term (current) drug therapy: Secondary | ICD-10-CM | POA: Diagnosis not present

## 2017-08-20 DIAGNOSIS — I10 Essential (primary) hypertension: Secondary | ICD-10-CM | POA: Diagnosis not present

## 2017-08-20 DIAGNOSIS — N269 Renal sclerosis, unspecified: Secondary | ICD-10-CM | POA: Diagnosis not present

## 2017-08-20 DIAGNOSIS — N261 Atrophy of kidney (terminal): Secondary | ICD-10-CM | POA: Diagnosis not present

## 2017-08-20 DIAGNOSIS — Z940 Kidney transplant status: Secondary | ICD-10-CM | POA: Diagnosis not present

## 2017-08-26 ENCOUNTER — Encounter: Payer: Self-pay | Admitting: Family Medicine

## 2017-08-26 DIAGNOSIS — Z940 Kidney transplant status: Secondary | ICD-10-CM | POA: Insufficient documentation

## 2017-08-26 NOTE — Progress Notes (Signed)
  HPI:  Patient presents today for a new patient appointment to establish general primary care. Patient has no issues to discuss. He recently underwent a kidney transplant in late December. He had ESRD 2/2 hypertension. Per his report everything is going very well with the kidney. He is making great urine and he is pleased that he no longer has to go to dialysis.  Immigrated from [place] in late 90s. Is a citizen. Moved to [place] from [place] in 2005.  ROS: See HPI  Past Medical Hx:  -Hypertension, HLD  Past Surgical Hx:  -Kidney Transplant, multiple AV fistulas for dialysis access, R IJ permcath placment.  Family Hx: updated in Epic  Social Hx: retired, lives at home with wife, no etoh, no smoking, no illicit drugs  Health Maintenance:  -needs tdap, hep C, influenza  PHYSICAL EXAM: BP 130/72   Pulse 88   Temp 98.3 F (36.8 C) (Oral)   Ht 6' (1.829 m)   Wt 231 lb (104.8 kg)   SpO2 98%   BMI 31.33 kg/m  Gen: well-appearing African American male. Comforable, aox4 HEENT: No distended tympanic membrane, external and internal nose without abnormality Heart: rrr, no m/r/g, Still has thrill in LUE BC fistula. RUE fistula with arterial flow, wasn't being used prior to transplant Lungs: lungs clear to ausculatation bilaterally Abdomen: soft, non-tender, non-distended Neuro: aox4. No focal neuro deficits, cn 2-12 intact  ASSESSMENT/PLAN:  Kidney transplant recipient S/P kidney transplant in December. Asked patient to please keep all follow up appointments with his [facility] and nephrologist. Everything has been going very well with the kidney. nO issues. Making good urine. To s/s of transplant rejection,     FOLLOW UP: Follow up in 1 year for annual physical  Guadalupe Dawn MD PGY-1 Family Medicine Resident

## 2017-08-26 NOTE — Assessment & Plan Note (Signed)
S/P kidney transplant in December. Asked patient to please keep all follow up appointments with his [facility] and nephrologist. Everything has been going very well with the kidney. nO issues. Making good urine. To s/s of transplant rejection,

## 2017-08-27 DIAGNOSIS — Z7952 Long term (current) use of systemic steroids: Secondary | ICD-10-CM | POA: Diagnosis not present

## 2017-08-27 DIAGNOSIS — E871 Hypo-osmolality and hyponatremia: Secondary | ICD-10-CM | POA: Diagnosis not present

## 2017-08-27 DIAGNOSIS — I2510 Atherosclerotic heart disease of native coronary artery without angina pectoris: Secondary | ICD-10-CM | POA: Diagnosis not present

## 2017-08-27 DIAGNOSIS — Z992 Dependence on renal dialysis: Secondary | ICD-10-CM | POA: Diagnosis not present

## 2017-08-27 DIAGNOSIS — Z4822 Encounter for aftercare following kidney transplant: Secondary | ICD-10-CM | POA: Diagnosis not present

## 2017-08-27 DIAGNOSIS — Z792 Long term (current) use of antibiotics: Secondary | ICD-10-CM | POA: Diagnosis not present

## 2017-08-27 DIAGNOSIS — I252 Old myocardial infarction: Secondary | ICD-10-CM | POA: Diagnosis not present

## 2017-08-27 DIAGNOSIS — Z940 Kidney transplant status: Secondary | ICD-10-CM | POA: Diagnosis not present

## 2017-08-27 DIAGNOSIS — Z79899 Other long term (current) drug therapy: Secondary | ICD-10-CM | POA: Diagnosis not present

## 2017-08-27 DIAGNOSIS — E872 Acidosis: Secondary | ICD-10-CM | POA: Diagnosis not present

## 2017-08-27 DIAGNOSIS — Z8679 Personal history of other diseases of the circulatory system: Secondary | ICD-10-CM | POA: Diagnosis not present

## 2017-08-27 DIAGNOSIS — I10 Essential (primary) hypertension: Secondary | ICD-10-CM | POA: Diagnosis not present

## 2017-08-27 DIAGNOSIS — Z7983 Long term (current) use of bisphosphonates: Secondary | ICD-10-CM | POA: Diagnosis not present

## 2017-08-27 DIAGNOSIS — D649 Anemia, unspecified: Secondary | ICD-10-CM | POA: Diagnosis not present

## 2017-08-27 DIAGNOSIS — L299 Pruritus, unspecified: Secondary | ICD-10-CM | POA: Diagnosis not present

## 2017-08-27 DIAGNOSIS — D8989 Other specified disorders involving the immune mechanism, not elsewhere classified: Secondary | ICD-10-CM | POA: Diagnosis not present

## 2017-09-03 DIAGNOSIS — Z4822 Encounter for aftercare following kidney transplant: Secondary | ICD-10-CM | POA: Diagnosis not present

## 2017-09-03 DIAGNOSIS — Z940 Kidney transplant status: Secondary | ICD-10-CM | POA: Diagnosis not present

## 2017-09-10 DIAGNOSIS — I2510 Atherosclerotic heart disease of native coronary artery without angina pectoris: Secondary | ICD-10-CM | POA: Diagnosis not present

## 2017-09-10 DIAGNOSIS — Z940 Kidney transplant status: Secondary | ICD-10-CM | POA: Diagnosis not present

## 2017-09-10 DIAGNOSIS — Z4822 Encounter for aftercare following kidney transplant: Secondary | ICD-10-CM | POA: Diagnosis not present

## 2017-09-10 DIAGNOSIS — E872 Acidosis: Secondary | ICD-10-CM | POA: Diagnosis not present

## 2017-09-10 DIAGNOSIS — D8989 Other specified disorders involving the immune mechanism, not elsewhere classified: Secondary | ICD-10-CM | POA: Diagnosis not present

## 2017-09-10 DIAGNOSIS — D631 Anemia in chronic kidney disease: Secondary | ICD-10-CM | POA: Diagnosis not present

## 2017-09-10 DIAGNOSIS — Z792 Long term (current) use of antibiotics: Secondary | ICD-10-CM | POA: Diagnosis not present

## 2017-09-10 DIAGNOSIS — L299 Pruritus, unspecified: Secondary | ICD-10-CM | POA: Diagnosis not present

## 2017-09-10 DIAGNOSIS — Z7952 Long term (current) use of systemic steroids: Secondary | ICD-10-CM | POA: Diagnosis not present

## 2017-09-10 DIAGNOSIS — Z79899 Other long term (current) drug therapy: Secondary | ICD-10-CM | POA: Diagnosis not present

## 2017-09-10 DIAGNOSIS — I120 Hypertensive chronic kidney disease with stage 5 chronic kidney disease or end stage renal disease: Secondary | ICD-10-CM | POA: Diagnosis not present

## 2017-09-10 DIAGNOSIS — N186 End stage renal disease: Secondary | ICD-10-CM | POA: Diagnosis not present

## 2017-09-10 DIAGNOSIS — I252 Old myocardial infarction: Secondary | ICD-10-CM | POA: Diagnosis not present

## 2017-09-10 DIAGNOSIS — I10 Essential (primary) hypertension: Secondary | ICD-10-CM | POA: Diagnosis not present

## 2017-09-10 DIAGNOSIS — Z992 Dependence on renal dialysis: Secondary | ICD-10-CM | POA: Diagnosis not present

## 2017-09-13 DIAGNOSIS — Z940 Kidney transplant status: Secondary | ICD-10-CM | POA: Diagnosis not present

## 2017-09-17 DIAGNOSIS — I10 Essential (primary) hypertension: Secondary | ICD-10-CM | POA: Diagnosis not present

## 2017-09-17 DIAGNOSIS — Z4822 Encounter for aftercare following kidney transplant: Secondary | ICD-10-CM | POA: Diagnosis not present

## 2017-09-17 DIAGNOSIS — Z7952 Long term (current) use of systemic steroids: Secondary | ICD-10-CM | POA: Diagnosis not present

## 2017-09-17 DIAGNOSIS — Z940 Kidney transplant status: Secondary | ICD-10-CM | POA: Diagnosis not present

## 2017-09-17 DIAGNOSIS — E872 Acidosis: Secondary | ICD-10-CM | POA: Diagnosis not present

## 2017-09-17 DIAGNOSIS — Z79899 Other long term (current) drug therapy: Secondary | ICD-10-CM | POA: Diagnosis not present

## 2017-09-17 DIAGNOSIS — Z792 Long term (current) use of antibiotics: Secondary | ICD-10-CM | POA: Diagnosis not present

## 2017-09-17 DIAGNOSIS — I252 Old myocardial infarction: Secondary | ICD-10-CM | POA: Diagnosis not present

## 2017-09-17 DIAGNOSIS — D8989 Other specified disorders involving the immune mechanism, not elsewhere classified: Secondary | ICD-10-CM | POA: Diagnosis not present

## 2017-09-17 DIAGNOSIS — I2510 Atherosclerotic heart disease of native coronary artery without angina pectoris: Secondary | ICD-10-CM | POA: Diagnosis not present

## 2017-09-17 DIAGNOSIS — D72819 Decreased white blood cell count, unspecified: Secondary | ICD-10-CM | POA: Diagnosis not present

## 2017-09-17 DIAGNOSIS — D649 Anemia, unspecified: Secondary | ICD-10-CM | POA: Diagnosis not present

## 2017-10-01 DIAGNOSIS — J8410 Pulmonary fibrosis, unspecified: Secondary | ICD-10-CM | POA: Diagnosis not present

## 2017-10-01 DIAGNOSIS — Z792 Long term (current) use of antibiotics: Secondary | ICD-10-CM | POA: Diagnosis not present

## 2017-10-01 DIAGNOSIS — N261 Atrophy of kidney (terminal): Secondary | ICD-10-CM | POA: Diagnosis not present

## 2017-10-01 DIAGNOSIS — D72819 Decreased white blood cell count, unspecified: Secondary | ICD-10-CM | POA: Diagnosis not present

## 2017-10-01 DIAGNOSIS — Z940 Kidney transplant status: Secondary | ICD-10-CM | POA: Diagnosis not present

## 2017-10-01 DIAGNOSIS — Z79899 Other long term (current) drug therapy: Secondary | ICD-10-CM | POA: Diagnosis not present

## 2017-10-01 DIAGNOSIS — E872 Acidosis: Secondary | ICD-10-CM | POA: Diagnosis not present

## 2017-10-01 DIAGNOSIS — I2510 Atherosclerotic heart disease of native coronary artery without angina pectoris: Secondary | ICD-10-CM | POA: Diagnosis not present

## 2017-10-01 DIAGNOSIS — I252 Old myocardial infarction: Secondary | ICD-10-CM | POA: Diagnosis not present

## 2017-10-01 DIAGNOSIS — Z7952 Long term (current) use of systemic steroids: Secondary | ICD-10-CM | POA: Diagnosis not present

## 2017-10-01 DIAGNOSIS — Z4822 Encounter for aftercare following kidney transplant: Secondary | ICD-10-CM | POA: Diagnosis not present

## 2017-10-01 DIAGNOSIS — D649 Anemia, unspecified: Secondary | ICD-10-CM | POA: Diagnosis not present

## 2017-10-01 DIAGNOSIS — I10 Essential (primary) hypertension: Secondary | ICD-10-CM | POA: Diagnosis not present

## 2017-10-08 DIAGNOSIS — Z4822 Encounter for aftercare following kidney transplant: Secondary | ICD-10-CM | POA: Diagnosis not present

## 2017-10-08 DIAGNOSIS — Z940 Kidney transplant status: Secondary | ICD-10-CM | POA: Diagnosis not present

## 2017-10-15 DIAGNOSIS — N186 End stage renal disease: Secondary | ICD-10-CM | POA: Diagnosis not present

## 2017-10-15 DIAGNOSIS — Z79899 Other long term (current) drug therapy: Secondary | ICD-10-CM | POA: Diagnosis not present

## 2017-10-15 DIAGNOSIS — D631 Anemia in chronic kidney disease: Secondary | ICD-10-CM | POA: Diagnosis not present

## 2017-10-15 DIAGNOSIS — D72819 Decreased white blood cell count, unspecified: Secondary | ICD-10-CM | POA: Diagnosis not present

## 2017-10-15 DIAGNOSIS — Z792 Long term (current) use of antibiotics: Secondary | ICD-10-CM | POA: Diagnosis not present

## 2017-10-15 DIAGNOSIS — I2510 Atherosclerotic heart disease of native coronary artery without angina pectoris: Secondary | ICD-10-CM | POA: Diagnosis not present

## 2017-10-15 DIAGNOSIS — I10 Essential (primary) hypertension: Secondary | ICD-10-CM | POA: Diagnosis not present

## 2017-10-15 DIAGNOSIS — Z940 Kidney transplant status: Secondary | ICD-10-CM | POA: Diagnosis not present

## 2017-10-15 DIAGNOSIS — Z7982 Long term (current) use of aspirin: Secondary | ICD-10-CM | POA: Diagnosis not present

## 2017-10-15 DIAGNOSIS — Z4822 Encounter for aftercare following kidney transplant: Secondary | ICD-10-CM | POA: Diagnosis not present

## 2017-10-15 DIAGNOSIS — Z992 Dependence on renal dialysis: Secondary | ICD-10-CM | POA: Diagnosis not present

## 2017-10-15 DIAGNOSIS — I120 Hypertensive chronic kidney disease with stage 5 chronic kidney disease or end stage renal disease: Secondary | ICD-10-CM | POA: Diagnosis not present

## 2017-10-15 DIAGNOSIS — E872 Acidosis: Secondary | ICD-10-CM | POA: Diagnosis not present

## 2017-10-15 DIAGNOSIS — I252 Old myocardial infarction: Secondary | ICD-10-CM | POA: Diagnosis not present

## 2017-10-15 DIAGNOSIS — Z7952 Long term (current) use of systemic steroids: Secondary | ICD-10-CM | POA: Diagnosis not present

## 2017-10-29 DIAGNOSIS — I10 Essential (primary) hypertension: Secondary | ICD-10-CM | POA: Diagnosis not present

## 2017-10-29 DIAGNOSIS — E872 Acidosis: Secondary | ICD-10-CM | POA: Diagnosis not present

## 2017-10-29 DIAGNOSIS — I120 Hypertensive chronic kidney disease with stage 5 chronic kidney disease or end stage renal disease: Secondary | ICD-10-CM | POA: Diagnosis not present

## 2017-10-29 DIAGNOSIS — Z940 Kidney transplant status: Secondary | ICD-10-CM | POA: Diagnosis not present

## 2017-10-29 DIAGNOSIS — D72819 Decreased white blood cell count, unspecified: Secondary | ICD-10-CM | POA: Diagnosis not present

## 2017-10-29 DIAGNOSIS — D8989 Other specified disorders involving the immune mechanism, not elsewhere classified: Secondary | ICD-10-CM | POA: Diagnosis not present

## 2017-10-29 DIAGNOSIS — I252 Old myocardial infarction: Secondary | ICD-10-CM | POA: Diagnosis not present

## 2017-10-29 DIAGNOSIS — Z4822 Encounter for aftercare following kidney transplant: Secondary | ICD-10-CM | POA: Diagnosis not present

## 2017-10-29 DIAGNOSIS — E869 Volume depletion, unspecified: Secondary | ICD-10-CM | POA: Diagnosis not present

## 2017-10-29 DIAGNOSIS — Z8679 Personal history of other diseases of the circulatory system: Secondary | ICD-10-CM | POA: Diagnosis not present

## 2017-10-29 DIAGNOSIS — N186 End stage renal disease: Secondary | ICD-10-CM | POA: Diagnosis not present

## 2017-10-29 DIAGNOSIS — Z79899 Other long term (current) drug therapy: Secondary | ICD-10-CM | POA: Diagnosis not present

## 2017-10-29 DIAGNOSIS — Z792 Long term (current) use of antibiotics: Secondary | ICD-10-CM | POA: Diagnosis not present

## 2017-10-29 DIAGNOSIS — Z992 Dependence on renal dialysis: Secondary | ICD-10-CM | POA: Diagnosis not present

## 2017-10-29 DIAGNOSIS — Z7952 Long term (current) use of systemic steroids: Secondary | ICD-10-CM | POA: Diagnosis not present

## 2017-10-29 DIAGNOSIS — D649 Anemia, unspecified: Secondary | ICD-10-CM | POA: Diagnosis not present

## 2017-11-12 DIAGNOSIS — D899 Disorder involving the immune mechanism, unspecified: Secondary | ICD-10-CM | POA: Diagnosis not present

## 2017-11-12 DIAGNOSIS — I10 Essential (primary) hypertension: Secondary | ICD-10-CM | POA: Diagnosis not present

## 2017-11-12 DIAGNOSIS — E872 Acidosis: Secondary | ICD-10-CM | POA: Diagnosis not present

## 2017-11-12 DIAGNOSIS — R0601 Orthopnea: Secondary | ICD-10-CM | POA: Diagnosis not present

## 2017-11-12 DIAGNOSIS — I252 Old myocardial infarction: Secondary | ICD-10-CM | POA: Diagnosis not present

## 2017-11-12 DIAGNOSIS — D72819 Decreased white blood cell count, unspecified: Secondary | ICD-10-CM | POA: Diagnosis not present

## 2017-11-12 DIAGNOSIS — I2510 Atherosclerotic heart disease of native coronary artery without angina pectoris: Secondary | ICD-10-CM | POA: Diagnosis not present

## 2017-11-12 DIAGNOSIS — Z4822 Encounter for aftercare following kidney transplant: Secondary | ICD-10-CM | POA: Diagnosis not present

## 2017-11-12 DIAGNOSIS — Z01818 Encounter for other preprocedural examination: Secondary | ICD-10-CM | POA: Diagnosis not present

## 2017-11-12 DIAGNOSIS — Z7952 Long term (current) use of systemic steroids: Secondary | ICD-10-CM | POA: Diagnosis not present

## 2017-11-12 DIAGNOSIS — Z79899 Other long term (current) drug therapy: Secondary | ICD-10-CM | POA: Diagnosis not present

## 2017-11-12 DIAGNOSIS — Z940 Kidney transplant status: Secondary | ICD-10-CM | POA: Diagnosis not present

## 2017-11-14 DIAGNOSIS — Z4822 Encounter for aftercare following kidney transplant: Secondary | ICD-10-CM | POA: Diagnosis not present

## 2017-11-26 DIAGNOSIS — I2510 Atherosclerotic heart disease of native coronary artery without angina pectoris: Secondary | ICD-10-CM | POA: Diagnosis not present

## 2017-11-26 DIAGNOSIS — Z79899 Other long term (current) drug therapy: Secondary | ICD-10-CM | POA: Diagnosis not present

## 2017-11-26 DIAGNOSIS — Z4822 Encounter for aftercare following kidney transplant: Secondary | ICD-10-CM | POA: Diagnosis not present

## 2017-11-26 DIAGNOSIS — D72819 Decreased white blood cell count, unspecified: Secondary | ICD-10-CM | POA: Diagnosis not present

## 2017-11-26 DIAGNOSIS — I10 Essential (primary) hypertension: Secondary | ICD-10-CM | POA: Diagnosis not present

## 2017-11-26 DIAGNOSIS — Z7952 Long term (current) use of systemic steroids: Secondary | ICD-10-CM | POA: Diagnosis not present

## 2017-11-26 DIAGNOSIS — D649 Anemia, unspecified: Secondary | ICD-10-CM | POA: Diagnosis not present

## 2017-12-24 DIAGNOSIS — E872 Acidosis: Secondary | ICD-10-CM | POA: Diagnosis not present

## 2017-12-24 DIAGNOSIS — Z940 Kidney transplant status: Secondary | ICD-10-CM | POA: Diagnosis not present

## 2017-12-24 DIAGNOSIS — D649 Anemia, unspecified: Secondary | ICD-10-CM | POA: Diagnosis not present

## 2017-12-24 DIAGNOSIS — Z79899 Other long term (current) drug therapy: Secondary | ICD-10-CM | POA: Diagnosis not present

## 2017-12-24 DIAGNOSIS — Z7952 Long term (current) use of systemic steroids: Secondary | ICD-10-CM | POA: Diagnosis not present

## 2017-12-24 DIAGNOSIS — Z4822 Encounter for aftercare following kidney transplant: Secondary | ICD-10-CM | POA: Diagnosis not present

## 2017-12-24 DIAGNOSIS — I252 Old myocardial infarction: Secondary | ICD-10-CM | POA: Diagnosis not present

## 2017-12-24 DIAGNOSIS — Z792 Long term (current) use of antibiotics: Secondary | ICD-10-CM | POA: Diagnosis not present

## 2017-12-24 DIAGNOSIS — I2510 Atherosclerotic heart disease of native coronary artery without angina pectoris: Secondary | ICD-10-CM | POA: Diagnosis not present

## 2017-12-24 DIAGNOSIS — I10 Essential (primary) hypertension: Secondary | ICD-10-CM | POA: Diagnosis not present

## 2017-12-24 DIAGNOSIS — D72819 Decreased white blood cell count, unspecified: Secondary | ICD-10-CM | POA: Diagnosis not present

## 2017-12-24 DIAGNOSIS — D8989 Other specified disorders involving the immune mechanism, not elsewhere classified: Secondary | ICD-10-CM | POA: Diagnosis not present

## 2018-01-21 DIAGNOSIS — Z7952 Long term (current) use of systemic steroids: Secondary | ICD-10-CM | POA: Diagnosis not present

## 2018-01-21 DIAGNOSIS — E785 Hyperlipidemia, unspecified: Secondary | ICD-10-CM | POA: Diagnosis not present

## 2018-01-21 DIAGNOSIS — R7309 Other abnormal glucose: Secondary | ICD-10-CM | POA: Diagnosis not present

## 2018-01-21 DIAGNOSIS — Z4822 Encounter for aftercare following kidney transplant: Secondary | ICD-10-CM | POA: Diagnosis not present

## 2018-01-21 DIAGNOSIS — E872 Acidosis: Secondary | ICD-10-CM | POA: Diagnosis not present

## 2018-01-21 DIAGNOSIS — Z792 Long term (current) use of antibiotics: Secondary | ICD-10-CM | POA: Diagnosis not present

## 2018-01-21 DIAGNOSIS — D72819 Decreased white blood cell count, unspecified: Secondary | ICD-10-CM | POA: Diagnosis not present

## 2018-01-21 DIAGNOSIS — I2510 Atherosclerotic heart disease of native coronary artery without angina pectoris: Secondary | ICD-10-CM | POA: Diagnosis not present

## 2018-01-21 DIAGNOSIS — D649 Anemia, unspecified: Secondary | ICD-10-CM | POA: Diagnosis not present

## 2018-01-21 DIAGNOSIS — Z79899 Other long term (current) drug therapy: Secondary | ICD-10-CM | POA: Diagnosis not present

## 2018-01-21 DIAGNOSIS — N186 End stage renal disease: Secondary | ICD-10-CM | POA: Diagnosis not present

## 2018-01-21 DIAGNOSIS — Z940 Kidney transplant status: Secondary | ICD-10-CM | POA: Diagnosis not present

## 2018-01-21 DIAGNOSIS — I252 Old myocardial infarction: Secondary | ICD-10-CM | POA: Diagnosis not present

## 2018-01-21 DIAGNOSIS — D8989 Other specified disorders involving the immune mechanism, not elsewhere classified: Secondary | ICD-10-CM | POA: Diagnosis not present

## 2018-01-21 DIAGNOSIS — I10 Essential (primary) hypertension: Secondary | ICD-10-CM | POA: Diagnosis not present

## 2018-01-28 DIAGNOSIS — Z940 Kidney transplant status: Secondary | ICD-10-CM | POA: Diagnosis not present

## 2018-01-28 DIAGNOSIS — Z4822 Encounter for aftercare following kidney transplant: Secondary | ICD-10-CM | POA: Diagnosis not present

## 2018-02-18 DIAGNOSIS — Z940 Kidney transplant status: Secondary | ICD-10-CM | POA: Diagnosis not present

## 2018-02-18 DIAGNOSIS — Z4822 Encounter for aftercare following kidney transplant: Secondary | ICD-10-CM | POA: Diagnosis not present

## 2018-02-18 DIAGNOSIS — D6959 Other secondary thrombocytopenia: Secondary | ICD-10-CM | POA: Diagnosis not present

## 2018-02-18 DIAGNOSIS — N2889 Other specified disorders of kidney and ureter: Secondary | ICD-10-CM | POA: Diagnosis not present

## 2018-02-18 DIAGNOSIS — Z79899 Other long term (current) drug therapy: Secondary | ICD-10-CM | POA: Diagnosis not present

## 2018-02-18 DIAGNOSIS — T50905A Adverse effect of unspecified drugs, medicaments and biological substances, initial encounter: Secondary | ICD-10-CM | POA: Diagnosis not present

## 2018-02-18 DIAGNOSIS — I2510 Atherosclerotic heart disease of native coronary artery without angina pectoris: Secondary | ICD-10-CM | POA: Diagnosis not present

## 2018-02-18 DIAGNOSIS — I151 Hypertension secondary to other renal disorders: Secondary | ICD-10-CM | POA: Diagnosis not present

## 2018-03-05 DIAGNOSIS — Z4822 Encounter for aftercare following kidney transplant: Secondary | ICD-10-CM | POA: Diagnosis not present

## 2018-03-18 DIAGNOSIS — N186 End stage renal disease: Secondary | ICD-10-CM | POA: Diagnosis not present

## 2018-03-18 DIAGNOSIS — Z7952 Long term (current) use of systemic steroids: Secondary | ICD-10-CM | POA: Diagnosis not present

## 2018-03-18 DIAGNOSIS — I252 Old myocardial infarction: Secondary | ICD-10-CM | POA: Diagnosis not present

## 2018-03-18 DIAGNOSIS — I10 Essential (primary) hypertension: Secondary | ICD-10-CM | POA: Diagnosis not present

## 2018-03-18 DIAGNOSIS — I2510 Atherosclerotic heart disease of native coronary artery without angina pectoris: Secondary | ICD-10-CM | POA: Diagnosis not present

## 2018-03-18 DIAGNOSIS — Z940 Kidney transplant status: Secondary | ICD-10-CM | POA: Diagnosis not present

## 2018-03-18 DIAGNOSIS — Z79899 Other long term (current) drug therapy: Secondary | ICD-10-CM | POA: Diagnosis not present

## 2018-03-18 DIAGNOSIS — D8989 Other specified disorders involving the immune mechanism, not elsewhere classified: Secondary | ICD-10-CM | POA: Diagnosis not present

## 2018-03-18 DIAGNOSIS — Z992 Dependence on renal dialysis: Secondary | ICD-10-CM | POA: Diagnosis not present

## 2018-03-18 DIAGNOSIS — D696 Thrombocytopenia, unspecified: Secondary | ICD-10-CM | POA: Diagnosis not present

## 2018-03-18 DIAGNOSIS — Z4822 Encounter for aftercare following kidney transplant: Secondary | ICD-10-CM | POA: Diagnosis not present

## 2018-03-18 DIAGNOSIS — I120 Hypertensive chronic kidney disease with stage 5 chronic kidney disease or end stage renal disease: Secondary | ICD-10-CM | POA: Diagnosis not present

## 2018-03-18 DIAGNOSIS — Z792 Long term (current) use of antibiotics: Secondary | ICD-10-CM | POA: Diagnosis not present

## 2018-03-19 DIAGNOSIS — Z940 Kidney transplant status: Secondary | ICD-10-CM | POA: Diagnosis not present

## 2018-03-19 DIAGNOSIS — Z4822 Encounter for aftercare following kidney transplant: Secondary | ICD-10-CM | POA: Diagnosis not present

## 2018-04-15 DIAGNOSIS — I252 Old myocardial infarction: Secondary | ICD-10-CM | POA: Diagnosis not present

## 2018-04-15 DIAGNOSIS — D8989 Other specified disorders involving the immune mechanism, not elsewhere classified: Secondary | ICD-10-CM | POA: Diagnosis not present

## 2018-04-15 DIAGNOSIS — I2510 Atherosclerotic heart disease of native coronary artery without angina pectoris: Secondary | ICD-10-CM | POA: Diagnosis not present

## 2018-04-15 DIAGNOSIS — Z4822 Encounter for aftercare following kidney transplant: Secondary | ICD-10-CM | POA: Diagnosis not present

## 2018-04-15 DIAGNOSIS — Z940 Kidney transplant status: Secondary | ICD-10-CM | POA: Diagnosis not present

## 2018-04-15 DIAGNOSIS — Z79899 Other long term (current) drug therapy: Secondary | ICD-10-CM | POA: Diagnosis not present

## 2018-04-15 DIAGNOSIS — D696 Thrombocytopenia, unspecified: Secondary | ICD-10-CM | POA: Diagnosis not present

## 2018-04-15 DIAGNOSIS — R5383 Other fatigue: Secondary | ICD-10-CM | POA: Diagnosis not present

## 2018-04-15 DIAGNOSIS — Z7952 Long term (current) use of systemic steroids: Secondary | ICD-10-CM | POA: Diagnosis not present

## 2018-04-15 DIAGNOSIS — I10 Essential (primary) hypertension: Secondary | ICD-10-CM | POA: Diagnosis not present

## 2018-04-22 DIAGNOSIS — D649 Anemia, unspecified: Secondary | ICD-10-CM | POA: Diagnosis not present

## 2018-04-22 DIAGNOSIS — Z940 Kidney transplant status: Secondary | ICD-10-CM | POA: Diagnosis not present

## 2018-04-22 DIAGNOSIS — E785 Hyperlipidemia, unspecified: Secondary | ICD-10-CM | POA: Diagnosis not present

## 2018-04-22 DIAGNOSIS — R7309 Other abnormal glucose: Secondary | ICD-10-CM | POA: Diagnosis not present

## 2018-04-22 DIAGNOSIS — I10 Essential (primary) hypertension: Secondary | ICD-10-CM | POA: Diagnosis not present

## 2018-04-22 DIAGNOSIS — I2510 Atherosclerotic heart disease of native coronary artery without angina pectoris: Secondary | ICD-10-CM | POA: Diagnosis not present

## 2018-04-22 DIAGNOSIS — N189 Chronic kidney disease, unspecified: Secondary | ICD-10-CM | POA: Diagnosis not present

## 2018-05-13 DIAGNOSIS — E878 Other disorders of electrolyte and fluid balance, not elsewhere classified: Secondary | ICD-10-CM | POA: Diagnosis not present

## 2018-05-13 DIAGNOSIS — Z23 Encounter for immunization: Secondary | ICD-10-CM | POA: Diagnosis not present

## 2018-05-13 DIAGNOSIS — I2510 Atherosclerotic heart disease of native coronary artery without angina pectoris: Secondary | ICD-10-CM | POA: Diagnosis not present

## 2018-05-13 DIAGNOSIS — I10 Essential (primary) hypertension: Secondary | ICD-10-CM | POA: Diagnosis not present

## 2018-05-13 DIAGNOSIS — Z79899 Other long term (current) drug therapy: Secondary | ICD-10-CM | POA: Diagnosis not present

## 2018-05-13 DIAGNOSIS — Z792 Long term (current) use of antibiotics: Secondary | ICD-10-CM | POA: Diagnosis not present

## 2018-05-13 DIAGNOSIS — Z8679 Personal history of other diseases of the circulatory system: Secondary | ICD-10-CM | POA: Diagnosis not present

## 2018-05-13 DIAGNOSIS — D696 Thrombocytopenia, unspecified: Secondary | ICD-10-CM | POA: Diagnosis not present

## 2018-05-13 DIAGNOSIS — Z4822 Encounter for aftercare following kidney transplant: Secondary | ICD-10-CM | POA: Diagnosis not present

## 2018-05-13 DIAGNOSIS — Z940 Kidney transplant status: Secondary | ICD-10-CM | POA: Diagnosis not present

## 2018-05-13 DIAGNOSIS — E869 Volume depletion, unspecified: Secondary | ICD-10-CM | POA: Diagnosis not present

## 2018-05-13 DIAGNOSIS — Z5181 Encounter for therapeutic drug level monitoring: Secondary | ICD-10-CM | POA: Diagnosis not present

## 2018-05-13 DIAGNOSIS — I252 Old myocardial infarction: Secondary | ICD-10-CM | POA: Diagnosis not present

## 2018-05-13 DIAGNOSIS — Z7952 Long term (current) use of systemic steroids: Secondary | ICD-10-CM | POA: Diagnosis not present

## 2018-05-13 DIAGNOSIS — D8989 Other specified disorders involving the immune mechanism, not elsewhere classified: Secondary | ICD-10-CM | POA: Diagnosis not present

## 2018-05-15 DIAGNOSIS — S6010XA Contusion of unspecified finger with damage to nail, initial encounter: Secondary | ICD-10-CM | POA: Diagnosis not present

## 2018-06-10 DIAGNOSIS — Z940 Kidney transplant status: Secondary | ICD-10-CM | POA: Diagnosis not present

## 2018-06-10 DIAGNOSIS — Z4822 Encounter for aftercare following kidney transplant: Secondary | ICD-10-CM | POA: Diagnosis not present

## 2018-06-10 DIAGNOSIS — I252 Old myocardial infarction: Secondary | ICD-10-CM | POA: Diagnosis not present

## 2018-06-10 DIAGNOSIS — Z792 Long term (current) use of antibiotics: Secondary | ICD-10-CM | POA: Diagnosis not present

## 2018-06-10 DIAGNOSIS — I110 Hypertensive heart disease with heart failure: Secondary | ICD-10-CM | POA: Diagnosis not present

## 2018-06-10 DIAGNOSIS — Z79899 Other long term (current) drug therapy: Secondary | ICD-10-CM | POA: Diagnosis not present

## 2018-06-10 DIAGNOSIS — D696 Thrombocytopenia, unspecified: Secondary | ICD-10-CM | POA: Diagnosis not present

## 2018-06-10 DIAGNOSIS — I2510 Atherosclerotic heart disease of native coronary artery without angina pectoris: Secondary | ICD-10-CM | POA: Diagnosis not present

## 2018-06-10 DIAGNOSIS — R1031 Right lower quadrant pain: Secondary | ICD-10-CM | POA: Diagnosis not present

## 2018-06-10 DIAGNOSIS — Z7952 Long term (current) use of systemic steroids: Secondary | ICD-10-CM | POA: Diagnosis not present

## 2018-06-10 DIAGNOSIS — I10 Essential (primary) hypertension: Secondary | ICD-10-CM | POA: Diagnosis not present

## 2018-06-10 DIAGNOSIS — D8989 Other specified disorders involving the immune mechanism, not elsewhere classified: Secondary | ICD-10-CM | POA: Diagnosis not present

## 2018-06-10 DIAGNOSIS — I517 Cardiomegaly: Secondary | ICD-10-CM | POA: Diagnosis not present

## 2018-07-08 DIAGNOSIS — T8619 Other complication of kidney transplant: Secondary | ICD-10-CM | POA: Diagnosis not present

## 2018-07-08 DIAGNOSIS — Z940 Kidney transplant status: Secondary | ICD-10-CM | POA: Diagnosis not present

## 2018-07-08 DIAGNOSIS — Z7952 Long term (current) use of systemic steroids: Secondary | ICD-10-CM | POA: Diagnosis not present

## 2018-07-08 DIAGNOSIS — Z79899 Other long term (current) drug therapy: Secondary | ICD-10-CM | POA: Diagnosis not present

## 2018-07-08 DIAGNOSIS — R300 Dysuria: Secondary | ICD-10-CM | POA: Diagnosis not present

## 2018-07-08 DIAGNOSIS — I770 Arteriovenous fistula, acquired: Secondary | ICD-10-CM | POA: Diagnosis not present

## 2018-07-08 DIAGNOSIS — I2510 Atherosclerotic heart disease of native coronary artery without angina pectoris: Secondary | ICD-10-CM | POA: Diagnosis not present

## 2018-07-08 DIAGNOSIS — I10 Essential (primary) hypertension: Secondary | ICD-10-CM | POA: Diagnosis not present

## 2018-07-08 DIAGNOSIS — I252 Old myocardial infarction: Secondary | ICD-10-CM | POA: Diagnosis not present

## 2018-07-08 DIAGNOSIS — D696 Thrombocytopenia, unspecified: Secondary | ICD-10-CM | POA: Diagnosis not present

## 2018-07-08 DIAGNOSIS — D8989 Other specified disorders involving the immune mechanism, not elsewhere classified: Secondary | ICD-10-CM | POA: Diagnosis not present

## 2018-07-08 DIAGNOSIS — Z792 Long term (current) use of antibiotics: Secondary | ICD-10-CM | POA: Diagnosis not present

## 2018-07-08 DIAGNOSIS — Z4822 Encounter for aftercare following kidney transplant: Secondary | ICD-10-CM | POA: Diagnosis not present

## 2018-07-21 DIAGNOSIS — Z4822 Encounter for aftercare following kidney transplant: Secondary | ICD-10-CM | POA: Diagnosis not present

## 2018-07-21 DIAGNOSIS — Z79899 Other long term (current) drug therapy: Secondary | ICD-10-CM | POA: Diagnosis not present

## 2018-07-21 DIAGNOSIS — T82848A Pain from vascular prosthetic devices, implants and grafts, initial encounter: Secondary | ICD-10-CM | POA: Diagnosis not present

## 2018-07-28 DIAGNOSIS — Z940 Kidney transplant status: Secondary | ICD-10-CM | POA: Diagnosis not present

## 2018-07-28 DIAGNOSIS — D649 Anemia, unspecified: Secondary | ICD-10-CM | POA: Diagnosis not present

## 2018-07-28 DIAGNOSIS — E785 Hyperlipidemia, unspecified: Secondary | ICD-10-CM | POA: Diagnosis not present

## 2018-07-28 DIAGNOSIS — I2510 Atherosclerotic heart disease of native coronary artery without angina pectoris: Secondary | ICD-10-CM | POA: Diagnosis not present

## 2018-07-28 DIAGNOSIS — R7303 Prediabetes: Secondary | ICD-10-CM | POA: Diagnosis not present

## 2018-07-28 DIAGNOSIS — I10 Essential (primary) hypertension: Secondary | ICD-10-CM | POA: Diagnosis not present

## 2018-09-19 IMAGING — CR DG CHEST 2V
2 series · 2 of 2 positions shown · non-contrast
Comparison: 12/15/2016

CLINICAL DATA: Dyspnea for 3 weeks, particularly in when recumbent.

EXAM:
CHEST  2 VIEW

[chest pa]
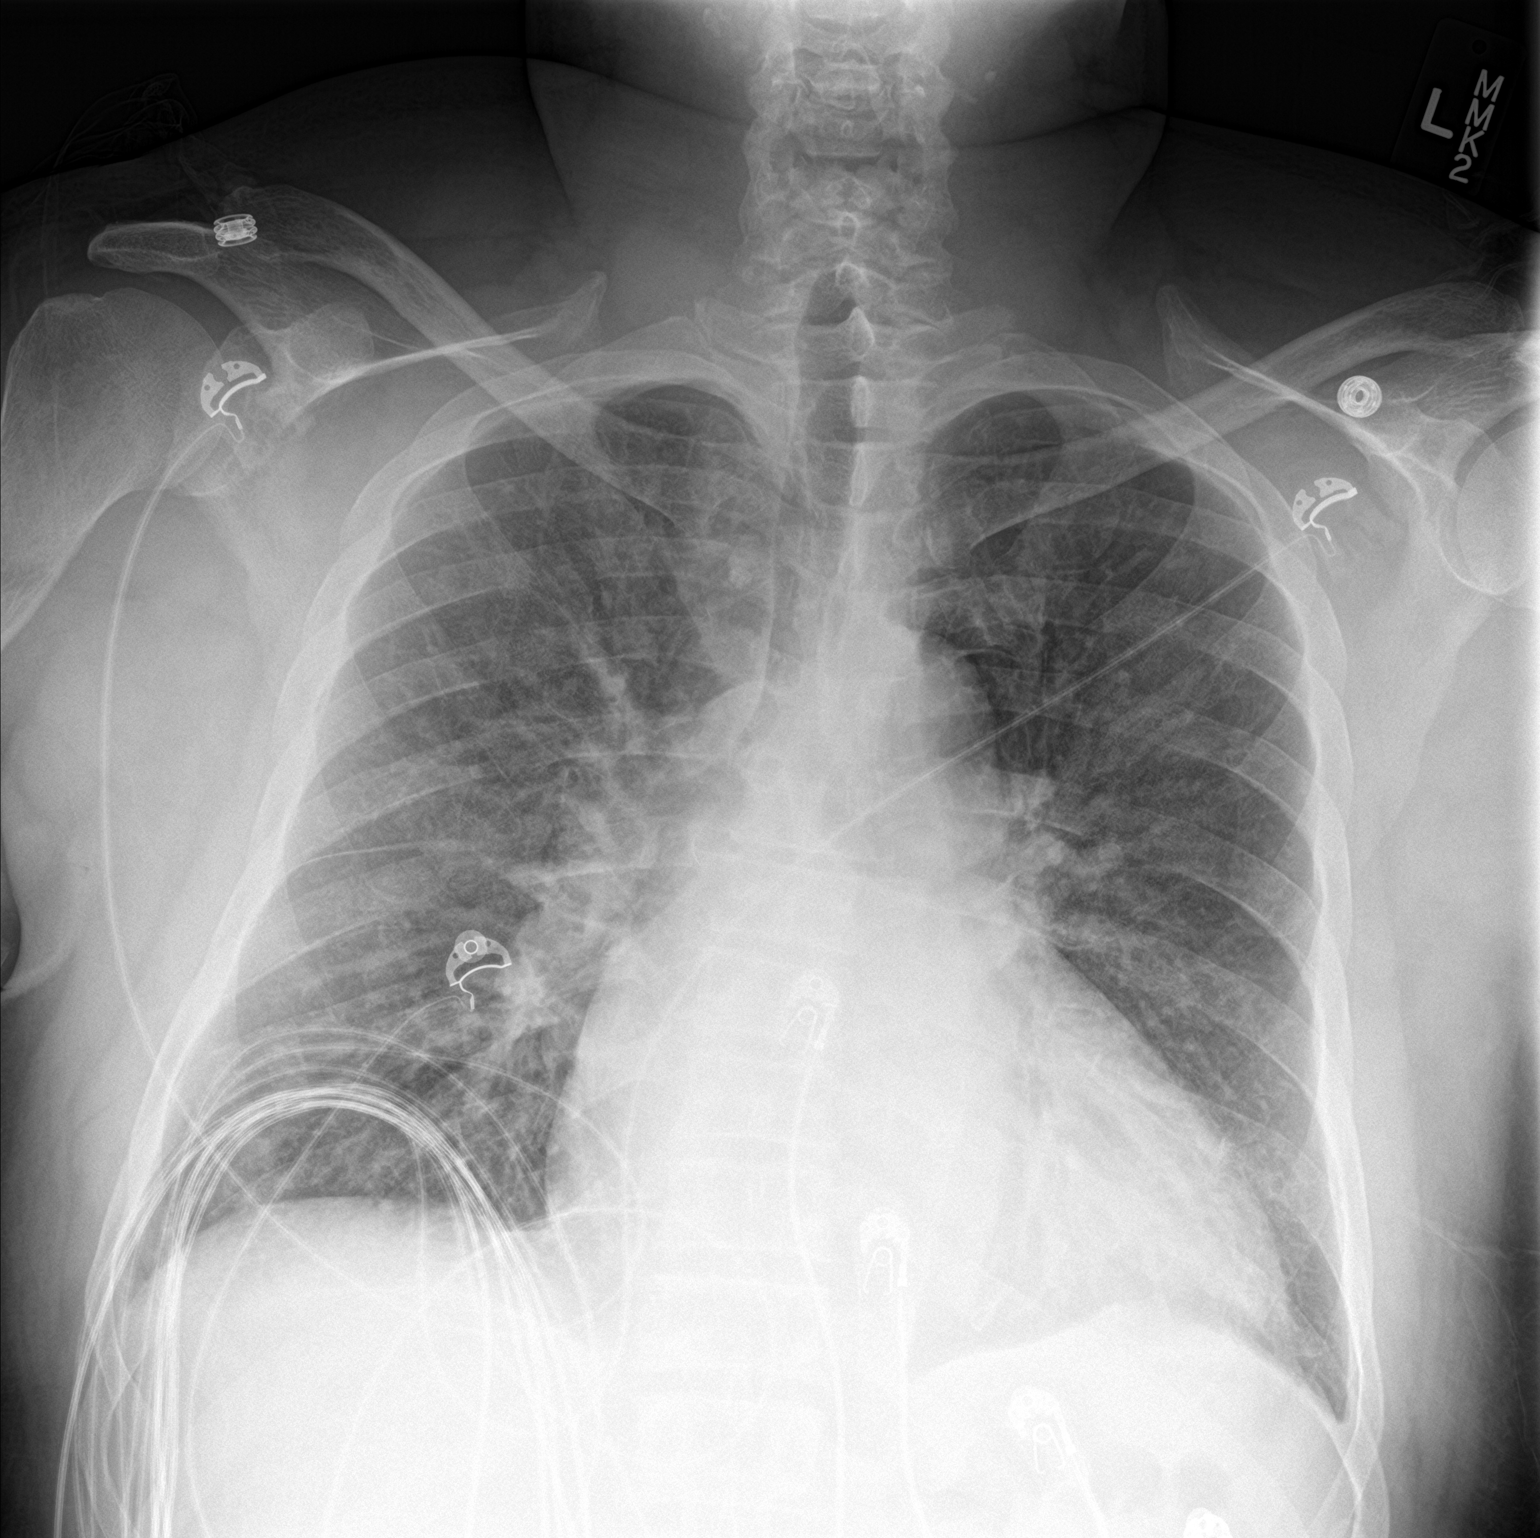

[chest lat]
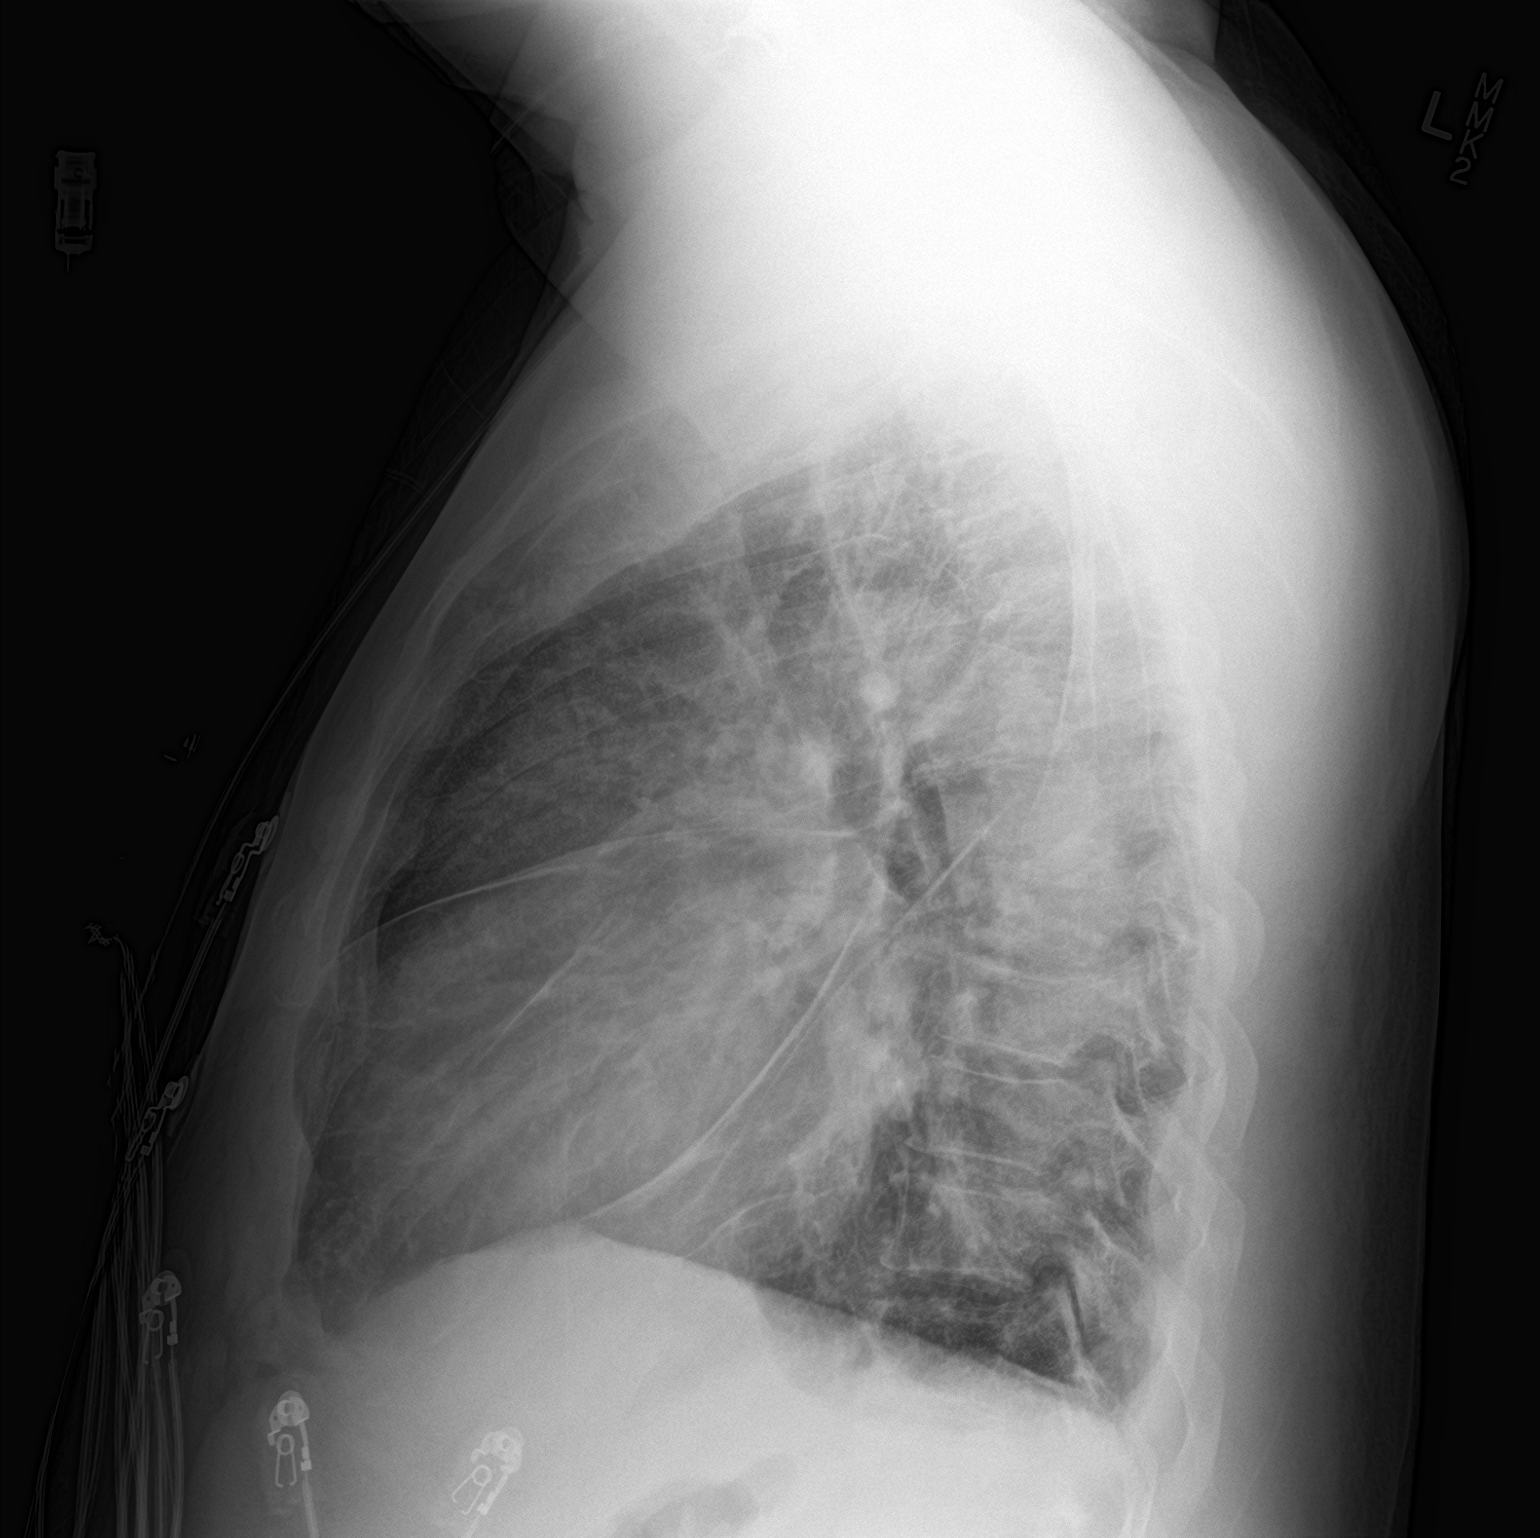

[2 of 2 positions shown; findings below may reference images not displayed]

FINDINGS: Unchanged cardiomegaly. Moderate central vascular congestion. Small
volume pleural fluid collected in the fissures and in the posterior
costophrenic angles.
IMPRESSION: Cardiomegaly, central vascular congestion and small volume pleural
fluid. No airspace consolidation.

## 2018-12-31 ENCOUNTER — Ambulatory Visit (INDEPENDENT_AMBULATORY_CARE_PROVIDER_SITE_OTHER): Payer: Medicare Other | Admitting: Family

## 2018-12-31 ENCOUNTER — Encounter: Payer: Self-pay | Admitting: Family

## 2018-12-31 ENCOUNTER — Other Ambulatory Visit: Payer: Self-pay

## 2018-12-31 ENCOUNTER — Other Ambulatory Visit: Payer: Self-pay | Admitting: *Deleted

## 2018-12-31 ENCOUNTER — Encounter: Payer: Self-pay | Admitting: *Deleted

## 2018-12-31 VITALS — BP 126/66 | HR 74 | Temp 97.8°F | Resp 12 | Ht 72.0 in | Wt 229.3 lb

## 2018-12-31 DIAGNOSIS — Z940 Kidney transplant status: Secondary | ICD-10-CM

## 2018-12-31 DIAGNOSIS — T82590A Other mechanical complication of surgically created arteriovenous fistula, initial encounter: Secondary | ICD-10-CM | POA: Diagnosis not present

## 2018-12-31 NOTE — H&P (View-Only) (Signed)
CC: evaluation of aneurysmal left upper arm AVF  History of Present Illness  Jon [name] is a 63 y.o. (1956-06-06) male who is s/p right radiocephalic fistula placed on 10/25/2015 by Dr. Scot Dock. He also has a left arm fistula. The left arm fistula was degenerative which was why access was placed in the right arm. The new fistula on the right arm was not maturing adequately. He underwent a fistulogram by Dr. Adele Barthel on 04/19/2016.  The fistulogram showed multiple issues with the fistula. The proximal vein had mild diffuse narrowing but no focal stenosis. The arterial anastomosis appeared to be patent. There were 2 competing branches in the upper forearm. The upper arm cephalic vein was reasonable initially but then became smaller in the upper third of the arm.  Dr. Scot Dock last evaluated pt on 06-20-16. At that time he discussed  several options with the patient. One option would be to reanastomose the cephalic vein into the radial artery higher up above the area of narrowing in the proximal fistula. At the same time we could ligate the competing branches. The second option would be an upper arm brachiocephalic fistula although this vein narrows down in the upper third of the arm and Dr. Scot Dock was concerned about this not functioning adequately also. Third option would be a basilic vein transposition. At that time pt felt strongly that the upper arm fistula on the left was working well and since he was on the transplant list he would like to continue using this until he gets a transplant. Dr. Scot Dock thought that this was reasonable. Pt was to return as needed.  Pt had a kidney transplant in 2018 and states that this is working well. He returns today at the request of Dr. Joelyn Oms to evaluate uncomfortable left upper arm aneurysmal AVF. Pt denies any steal type symptoms in either upper extremity.  He denies chest pain or dyspnea.    Past Medical History:  Diagnosis Date  . Chronic  kidney disease   . Dialysis patient ([place])   . GERD (gastroesophageal reflux disease)   . History of blood transfusion   . Hyperlipidemia    takes Atorvastatin daily  . Hypertension    takes Amlodipine,Losartan,and Labetalol daily  . Myocardial infarction [facility])    lived in [place] maybe 7 years ago  . Small bowel obstruction [facility])     Social History Social History   Tobacco Use  . Smoking status: Never Smoker  . Smokeless tobacco: Never Used  Substance Use Topics  . Alcohol use: No    Alcohol/week: 0.0 standard drinks  . Drug use: No    Family History Family History  Problem Relation Age of Onset  . Hypertension Mother   . Other Mother        varicose veins  . Diabetes Father   . Hypertension Father   . Hypertension Sister   . Other Sister        varicose veins  . Other Brother        varicose veins    Surgical History Past Surgical History:  Procedure Laterality Date  . ARTERIOVENOUS GRAFT PLACEMENT    . AV FISTULA PLACEMENT Right 10/25/2015   Procedure: ARTERIOVENOUS (AV) FISTULA CREATION RIGHT FOREARM;  Surgeon: Angelia Mould, MD;  Location: [place];  Service: Vascular;  Laterality: Right;  . CARDIAC CATHETERIZATION N/A 05/05/2015   Procedure: Left Heart Cath and Coronary Angiography;  Surgeon: Charolette Forward, MD;  Location: [place] CV LAB;  Service:  Cardiovascular;  Laterality: N/A;  . PERIPHERAL VASCULAR CATHETERIZATION N/A 08/18/2015   Procedure: Fistulagram;  Surgeon: Conrad Jud, MD;  Location: [place] CV LAB;  Service: Cardiovascular;  Laterality: N/A;  . PERIPHERAL VASCULAR CATHETERIZATION Right 04/19/2016   Procedure: Fistulagram;  Surgeon: Conrad Dearing, MD;  Location: [place] CV LAB;  Service: Cardiovascular;  Laterality: Right;  . RESECTION OF ARTERIOVENOUS FISTULA ANEURYSM Left 02/05/2013   Procedure: REPAIR OF ANEURYSM OF LEFT ARM ARTERIOVENOUS FISTULA ;  Surgeon: Serafina Mitchell, MD;  Location: [place];  Service: Vascular;  Laterality:  Left;  . RESECTION OF ARTERIOVENOUS FISTULA ANEURYSM Left 03/08/2015   Procedure: REPAIR OF LEFT ARTERIOVENOUS FISTULA PSEUDOANEURYSM;  Surgeon: Angelia Mould, MD;  Location: [place];  Service: Vascular;  Laterality: Left;  . REVISON OF ARTERIOVENOUS FISTULA Left 92/07/1939   Procedure: PLICATION OF A LARGE ANEURYSM LEFT UPPER ARM BRACHIO-CEPHALIC ARTERIOVENOUS FISTULA ;  Surgeon: Angelia Mould, MD;  Location: [place];  Service: Vascular;  Laterality: Left;  . SMALL INTESTINE SURGERY      No Known Allergies  Current Outpatient Medications  Medication Sig Dispense Refill  . albuterol (PROVENTIL HFA;VENTOLIN HFA) 108 (90 Base) MCG/ACT inhaler Inhale 2 puffs into the lungs every 6 (six) hours as needed for wheezing or shortness of breath. 1 Inhaler 2  . amLODipine (NORVASC) 10 MG tablet Take 10 mg by mouth at bedtime.    Marland Kitchen aspirin EC 81 MG EC tablet Take 1 tablet (81 mg total) by mouth daily. 30 tablet 3  . atorvastatin (LIPITOR) 20 MG tablet Take 1 tablet (20 mg total) by mouth daily at 6 PM. 30 tablet 3  . carvedilol (COREG) 25 MG tablet Take 1 tablet (25 mg total) by mouth 2 (two) times daily. 60 tablet 0  . famotidine (PEPCID) 20 MG tablet Take 1 tablet (20 mg total) by mouth 2 (two) times daily as needed for heartburn or indigestion. 60 tablet 1  . fluticasone (FLOVENT HFA) 44 MCG/ACT inhaler Inhale 1 puff into the lungs 2 (two) times daily. 1 Inhaler 5  . hydrALAZINE (APRESOLINE) 50 MG tablet Take 1 tablet (50 mg total) by mouth 2 (two) times daily. 60 tablet 0  . losartan (COZAAR) 100 MG tablet Take 1 tablet (100 mg total) by mouth at bedtime. 30 tablet 0  . spironolactone (ALDACTONE) 50 MG tablet TAKE 1 TABLET BY MOUTH ONCE DAILY 90 tablet 0   No current facility-administered medications for this visit.      REVIEW OF SYSTEMS: see HPI for pertinent positives and negatives    PHYSICAL EXAMINATION:  Vitals:   12/31/18 1011  BP: 126/66  Pulse: 74  Resp: 12  Temp: 97.8  F (36.6 C)  TempSrc: Oral  SpO2: 98%  Weight: 229 lb 4.8 oz (104 kg)  Height: 6' (1.829 m)   Body mass index is 31.1 kg/m.  General: The patient appears his stated age, fit appearing, obese by BMI.   HEENT:  No gross abnormalities Pulmonary: Respirations are non-labored, CTAB, good air movement in all fields Abdomen: Soft and non-tender with normal bowel sounds. Musculoskeletal: There are no major deformities.   Neurologic: No focal weakness or paresthesias are detected Skin: There are no ulcer or rashes noted. Psychiatric: The patient has normal affect. Cardiovascular: There is a regular rate and rhythm, occasional premature contractions, without significant murmur appreciated.  Palpable thrill and audible bruit in right lower arm AVF and left upper arm AVF. Left upper arm AVF is aneurysmal and  pulsatile, no thinning of skin over aneurysmal AVF.  Bilateral radial pulses are 2+ palpable. Bilateral hand grasp strength is 5/5. See photo below.    Left upper arm aneurysmal AVF   Medical Decision Making  Telford Knotek is a 63 y.o. male who is s/p right radiocephalic fistula placed on 10/25/2015 by Dr. Scot Dock. He also has a left arm fistula. The left arm fistula was degenerative which was why access was placed in the right arm.    Both arm AVF's have palpable thrills and audible bruits. The left arm AVF is aneurysmal, pulsatile, and uncomfortable to pt. Pt expressed desire that left upper arm aneurysmal AVF be removed. His right forearm AVF seems functional for back dialysis.  Will schedule ligation and excision of left upper arm AVF for 01-06-19 by Dr. Scot Dock.    Clemon Chambers, RN, MSN, FNP-C [facility] [place] [facility] [place] Office: [phone]  12/31/2018, 10:46 AM  Clinic MD: Scot Dock

## 2018-12-31 NOTE — Progress Notes (Signed)
CC: evaluation of aneurysmal left upper arm AVF  History of Present Illness  Jon [name] is a 63 y.o. (11-07-1955) male who is s/p right radiocephalic fistula placed on 10/25/2015 by Dr. Scot Dock. He also has a left arm fistula. The left arm fistula was degenerative which was why access was placed in the right arm. The new fistula on the right arm was not maturing adequately. He underwent a fistulogram by Dr. Adele Barthel on 04/19/2016.  The fistulogram showed multiple issues with the fistula. The proximal vein had mild diffuse narrowing but no focal stenosis. The arterial anastomosis appeared to be patent. There were 2 competing branches in the upper forearm. The upper arm cephalic vein was reasonable initially but then became smaller in the upper third of the arm.  Dr. Scot Dock last evaluated pt on 06-20-16. At that time he discussed  several options with the patient. One option would be to reanastomose the cephalic vein into the radial artery higher up above the area of narrowing in the proximal fistula. At the same time we could ligate the competing branches. The second option would be an upper arm brachiocephalic fistula although this vein narrows down in the upper third of the arm and Dr. Scot Dock was concerned about this not functioning adequately also. Third option would be a basilic vein transposition. At that time pt felt strongly that the upper arm fistula on the left was working well and since he was on the transplant list he would like to continue using this until he gets a transplant. Dr. Scot Dock thought that this was reasonable. Pt was to return as needed.  Pt had a kidney transplant in 2018 and states that this is working well. He returns today at the request of Dr. Joelyn Oms to evaluate uncomfortable left upper arm aneurysmal AVF. Pt denies any steal type symptoms in either upper extremity.  He denies chest pain or dyspnea.    Past Medical History:  Diagnosis Date  . Chronic  kidney disease   . Dialysis patient ([place])   . GERD (gastroesophageal reflux disease)   . History of blood transfusion   . Hyperlipidemia    takes Atorvastatin daily  . Hypertension    takes Amlodipine,Losartan,and Labetalol daily  . Myocardial infarction [facility])    lived in [place] maybe 7 years ago  . Small bowel obstruction [facility])     Social History Social History   Tobacco Use  . Smoking status: Never Smoker  . Smokeless tobacco: Never Used  Substance Use Topics  . Alcohol use: No    Alcohol/week: 0.0 standard drinks  . Drug use: No    Family History Family History  Problem Relation Age of Onset  . Hypertension Mother   . Other Mother        varicose veins  . Diabetes Father   . Hypertension Father   . Hypertension Sister   . Other Sister        varicose veins  . Other Brother        varicose veins    Surgical History Past Surgical History:  Procedure Laterality Date  . ARTERIOVENOUS GRAFT PLACEMENT    . AV FISTULA PLACEMENT Right 10/25/2015   Procedure: ARTERIOVENOUS (AV) FISTULA CREATION RIGHT FOREARM;  Surgeon: Angelia Mould, MD;  Location: [place];  Service: Vascular;  Laterality: Right;  . CARDIAC CATHETERIZATION N/A 05/05/2015   Procedure: Left Heart Cath and Coronary Angiography;  Surgeon: Charolette Forward, MD;  Location: [place] CV LAB;  Service:  Cardiovascular;  Laterality: N/A;  . PERIPHERAL VASCULAR CATHETERIZATION N/A 08/18/2015   Procedure: Fistulagram;  Surgeon: Conrad Sneads, MD;  Location: [place] CV LAB;  Service: Cardiovascular;  Laterality: N/A;  . PERIPHERAL VASCULAR CATHETERIZATION Right 04/19/2016   Procedure: Fistulagram;  Surgeon: Conrad Bowie, MD;  Location: [place] CV LAB;  Service: Cardiovascular;  Laterality: Right;  . RESECTION OF ARTERIOVENOUS FISTULA ANEURYSM Left 02/05/2013   Procedure: REPAIR OF ANEURYSM OF LEFT ARM ARTERIOVENOUS FISTULA ;  Surgeon: Serafina Mitchell, MD;  Location: [place];  Service: Vascular;  Laterality:  Left;  . RESECTION OF ARTERIOVENOUS FISTULA ANEURYSM Left 03/08/2015   Procedure: REPAIR OF LEFT ARTERIOVENOUS FISTULA PSEUDOANEURYSM;  Surgeon: Angelia Mould, MD;  Location: [place];  Service: Vascular;  Laterality: Left;  . REVISON OF ARTERIOVENOUS FISTULA Left 74/07/2876   Procedure: PLICATION OF A LARGE ANEURYSM LEFT UPPER ARM BRACHIO-CEPHALIC ARTERIOVENOUS FISTULA ;  Surgeon: Angelia Mould, MD;  Location: [place];  Service: Vascular;  Laterality: Left;  . SMALL INTESTINE SURGERY      No Known Allergies  Current Outpatient Medications  Medication Sig Dispense Refill  . albuterol (PROVENTIL HFA;VENTOLIN HFA) 108 (90 Base) MCG/ACT inhaler Inhale 2 puffs into the lungs every 6 (six) hours as needed for wheezing or shortness of breath. 1 Inhaler 2  . amLODipine (NORVASC) 10 MG tablet Take 10 mg by mouth at bedtime.    Marland Kitchen aspirin EC 81 MG EC tablet Take 1 tablet (81 mg total) by mouth daily. 30 tablet 3  . atorvastatin (LIPITOR) 20 MG tablet Take 1 tablet (20 mg total) by mouth daily at 6 PM. 30 tablet 3  . carvedilol (COREG) 25 MG tablet Take 1 tablet (25 mg total) by mouth 2 (two) times daily. 60 tablet 0  . famotidine (PEPCID) 20 MG tablet Take 1 tablet (20 mg total) by mouth 2 (two) times daily as needed for heartburn or indigestion. 60 tablet 1  . fluticasone (FLOVENT HFA) 44 MCG/ACT inhaler Inhale 1 puff into the lungs 2 (two) times daily. 1 Inhaler 5  . hydrALAZINE (APRESOLINE) 50 MG tablet Take 1 tablet (50 mg total) by mouth 2 (two) times daily. 60 tablet 0  . losartan (COZAAR) 100 MG tablet Take 1 tablet (100 mg total) by mouth at bedtime. 30 tablet 0  . spironolactone (ALDACTONE) 50 MG tablet TAKE 1 TABLET BY MOUTH ONCE DAILY 90 tablet 0   No current facility-administered medications for this visit.      REVIEW OF SYSTEMS: see HPI for pertinent positives and negatives    PHYSICAL EXAMINATION:  Vitals:   12/31/18 1011  BP: 126/66  Pulse: 74  Resp: 12  Temp: 97.8  F (36.6 C)  TempSrc: Oral  SpO2: 98%  Weight: 229 lb 4.8 oz (104 kg)  Height: 6' (1.829 m)   Body mass index is 31.1 kg/m.  General: The patient appears his stated age, fit appearing, obese by BMI.   HEENT:  No gross abnormalities Pulmonary: Respirations are non-labored, CTAB, good air movement in all fields Abdomen: Soft and non-tender with normal bowel sounds. Musculoskeletal: There are no major deformities.   Neurologic: No focal weakness or paresthesias are detected Skin: There are no ulcer or rashes noted. Psychiatric: The patient has normal affect. Cardiovascular: There is a regular rate and rhythm, occasional premature contractions, without significant murmur appreciated.  Palpable thrill and audible bruit in right lower arm AVF and left upper arm AVF. Left upper arm AVF is aneurysmal and  pulsatile, no thinning of skin over aneurysmal AVF.  Bilateral radial pulses are 2+ palpable. Bilateral hand grasp strength is 5/5. See photo below.    Left upper arm aneurysmal AVF   Medical Decision Making  Jon [name] is a 63 y.o. male who is s/p right radiocephalic fistula placed on 10/25/2015 by Dr. Scot Dock. He also has a left arm fistula. The left arm fistula was degenerative which was why access was placed in the right arm.    Both arm AVF's have palpable thrills and audible bruits. The left arm AVF is aneurysmal, pulsatile, and uncomfortable to pt. Pt expressed desire that left upper arm aneurysmal AVF be removed. His right forearm AVF seems functional for back dialysis.  Will schedule ligation and excision of left upper arm AVF for 01-06-19 by Dr. Scot Dock.    Clemon Chambers, RN, MSN, FNP-C [facility] [place] Office: [phone]  12/31/2018, 10:46 AM  Clinic MD: Scot Dock

## 2019-01-05 ENCOUNTER — Other Ambulatory Visit: Payer: Self-pay

## 2019-01-05 ENCOUNTER — Other Ambulatory Visit (HOSPITAL_COMMUNITY)
Admission: RE | Admit: 2019-01-05 | Discharge: 2019-01-05 | Disposition: A | Discharge status: Home / Self Care | Payer: Medicare Other | Source: Ambulatory Visit | Attending: Vascular Surgery | Admitting: Vascular Surgery

## 2019-01-05 ENCOUNTER — Encounter (HOSPITAL_COMMUNITY): Payer: Self-pay | Admitting: *Deleted

## 2019-01-05 DIAGNOSIS — Z1159 Encounter for screening for other viral diseases: Secondary | ICD-10-CM | POA: Diagnosis present

## 2019-01-05 LAB — SARS CORONAVIRUS 2 BY RT PCR (HOSPITAL ORDER, PERFORMED IN ~~LOC~~ HOSPITAL LAB): SARS Coronavirus 2: NEGATIVE

## 2019-01-05 NOTE — Progress Notes (Addendum)
Denies chest pain or shortness of breath. States he see Dr. Terrence Dupont.

## 2019-01-06 ENCOUNTER — Encounter (HOSPITAL_COMMUNITY)
Admission: RE | Disposition: A | Discharge status: Home / Self Care | Payer: Self-pay | Source: Home / Self Care | Attending: Vascular Surgery

## 2019-01-06 ENCOUNTER — Ambulatory Visit (HOSPITAL_COMMUNITY)
Admission: RE | Admit: 2019-01-06 | Discharge: 2019-01-06 | Disposition: A | Discharge status: Home / Self Care | Payer: Medicare Other | Attending: Vascular Surgery | Admitting: Vascular Surgery

## 2019-01-06 ENCOUNTER — Ambulatory Visit (HOSPITAL_COMMUNITY): Payer: Medicare Other | Admitting: Anesthesiology

## 2019-01-06 ENCOUNTER — Encounter (HOSPITAL_COMMUNITY): Payer: Self-pay | Admitting: *Deleted

## 2019-01-06 DIAGNOSIS — I252 Old myocardial infarction: Secondary | ICD-10-CM | POA: Diagnosis not present

## 2019-01-06 DIAGNOSIS — E785 Hyperlipidemia, unspecified: Secondary | ICD-10-CM | POA: Insufficient documentation

## 2019-01-06 DIAGNOSIS — N186 End stage renal disease: Secondary | ICD-10-CM | POA: Insufficient documentation

## 2019-01-06 DIAGNOSIS — Z833 Family history of diabetes mellitus: Secondary | ICD-10-CM | POA: Insufficient documentation

## 2019-01-06 DIAGNOSIS — D631 Anemia in chronic kidney disease: Secondary | ICD-10-CM | POA: Insufficient documentation

## 2019-01-06 DIAGNOSIS — X58XXXA Exposure to other specified factors, initial encounter: Secondary | ICD-10-CM | POA: Diagnosis not present

## 2019-01-06 DIAGNOSIS — T82898A Other specified complication of vascular prosthetic devices, implants and grafts, initial encounter: Secondary | ICD-10-CM

## 2019-01-06 DIAGNOSIS — Z79899 Other long term (current) drug therapy: Secondary | ICD-10-CM | POA: Insufficient documentation

## 2019-01-06 DIAGNOSIS — I120 Hypertensive chronic kidney disease with stage 5 chronic kidney disease or end stage renal disease: Secondary | ICD-10-CM | POA: Insufficient documentation

## 2019-01-06 DIAGNOSIS — Z7951 Long term (current) use of inhaled steroids: Secondary | ICD-10-CM | POA: Diagnosis not present

## 2019-01-06 DIAGNOSIS — K219 Gastro-esophageal reflux disease without esophagitis: Secondary | ICD-10-CM | POA: Diagnosis not present

## 2019-01-06 DIAGNOSIS — Z940 Kidney transplant status: Secondary | ICD-10-CM

## 2019-01-06 DIAGNOSIS — Z992 Dependence on renal dialysis: Secondary | ICD-10-CM | POA: Insufficient documentation

## 2019-01-06 DIAGNOSIS — Z7982 Long term (current) use of aspirin: Secondary | ICD-10-CM | POA: Insufficient documentation

## 2019-01-06 DIAGNOSIS — Z8249 Family history of ischemic heart disease and other diseases of the circulatory system: Secondary | ICD-10-CM | POA: Insufficient documentation

## 2019-01-06 DIAGNOSIS — I491 Atrial premature depolarization: Secondary | ICD-10-CM | POA: Diagnosis not present

## 2019-01-06 DIAGNOSIS — I129 Hypertensive chronic kidney disease with stage 1 through stage 4 chronic kidney disease, or unspecified chronic kidney disease: Secondary | ICD-10-CM | POA: Diagnosis present

## 2019-01-06 HISTORY — PX: LIGATION OF ARTERIOVENOUS  FISTULA: SHX5948

## 2019-01-06 LAB — POCT I-STAT 4, (NA,K, GLUC, HGB,HCT)
Glucose, Bld: 107 mg/dL — ABNORMAL HIGH (ref 70–99)
HCT: 48 % (ref 39.0–52.0)
Hemoglobin: 16.3 g/dL (ref 13.0–17.0)
Potassium: 3.7 mmol/L (ref 3.5–5.1)
Sodium: 141 mmol/L (ref 135–145)

## 2019-01-06 SURGERY — LIGATION OF ARTERIOVENOUS  FISTULA
Anesthesia: General | Site: Arm Upper | Laterality: Left

## 2019-01-06 MED ORDER — HEPARIN SODIUM (PORCINE) 1000 UNIT/ML IJ SOLN
INTRAMUSCULAR | Status: DC | PRN
  Administered 2019-01-06: 10000 [IU] via INTRAVENOUS

## 2019-01-06 MED ORDER — HYDRALAZINE HCL 50 MG PO TABS: 50.0000 mg | ORAL_TABLET | Freq: Three times a day (TID) | ORAL | Status: DC

## 2019-01-06 MED ORDER — CEFAZOLIN SODIUM-DEXTROSE 2-4 GM/100ML-% IV SOLN
2.0000 g | INTRAVENOUS | Status: AC
  Administered 2019-01-06: 2 g via INTRAVENOUS

## 2019-01-06 MED ORDER — LIDOCAINE-EPINEPHRINE (PF) 1 %-1:200000 IJ SOLN
INTRAMUSCULAR | Status: AC
  Filled 2019-01-06: qty 30

## 2019-01-06 MED ORDER — MIDAZOLAM HCL 2 MG/2ML IJ SOLN
INTRAMUSCULAR | Status: AC
  Filled 2019-01-06: qty 2

## 2019-01-06 MED ORDER — MIDAZOLAM HCL 2 MG/2ML IJ SOLN
INTRAMUSCULAR | Status: DC | PRN
  Administered 2019-01-06 (×2): 1 mg via INTRAVENOUS

## 2019-01-06 MED ORDER — DEXAMETHASONE SODIUM PHOSPHATE 10 MG/ML IJ SOLN
INTRAMUSCULAR | Status: DC | PRN
  Administered 2019-01-06: 10 mg via INTRAVENOUS

## 2019-01-06 MED ORDER — PROPOFOL 10 MG/ML IV BOLUS
INTRAVENOUS | Status: DC | PRN
  Administered 2019-01-06: 150 mg via INTRAVENOUS

## 2019-01-06 MED ORDER — PROTAMINE SULFATE 10 MG/ML IV SOLN
INTRAVENOUS | Status: DC | PRN
  Administered 2019-01-06: 50 mg via INTRAVENOUS

## 2019-01-06 MED ORDER — ROCURONIUM BROMIDE 10 MG/ML (PF) SYRINGE
PREFILLED_SYRINGE | INTRAVENOUS | Status: AC
  Filled 2019-01-06: qty 10

## 2019-01-06 MED ORDER — DEXAMETHASONE SODIUM PHOSPHATE 10 MG/ML IJ SOLN
INTRAMUSCULAR | Status: AC
  Filled 2019-01-06: qty 1

## 2019-01-06 MED ORDER — SODIUM CHLORIDE 0.9 % IV SOLN
INTRAVENOUS | Status: DC | PRN
  Administered 2019-01-06: 11:00:00 40 ug/min via INTRAVENOUS

## 2019-01-06 MED ORDER — EPHEDRINE 5 MG/ML INJ
INTRAVENOUS | Status: AC
  Filled 2019-01-06: qty 10

## 2019-01-06 MED ORDER — FENTANYL CITRATE (PF) 250 MCG/5ML IJ SOLN
INTRAMUSCULAR | Status: DC | PRN
  Administered 2019-01-06: 50 ug via INTRAVENOUS
  Administered 2019-01-06 (×2): 25 ug via INTRAVENOUS
  Administered 2019-01-06: 50 ug via INTRAVENOUS

## 2019-01-06 MED ORDER — PROTAMINE SULFATE 10 MG/ML IV SOLN
INTRAVENOUS | Status: AC
  Filled 2019-01-06: qty 25

## 2019-01-06 MED ORDER — SODIUM CHLORIDE 0.9 % IV SOLN: INTRAVENOUS | Status: DC

## 2019-01-06 MED ORDER — 0.9 % SODIUM CHLORIDE (POUR BTL) OPTIME
TOPICAL | Status: DC | PRN
  Administered 2019-01-06: 1000 mL

## 2019-01-06 MED ORDER — LIDOCAINE 2% (20 MG/ML) 5 ML SYRINGE
INTRAMUSCULAR | Status: AC
  Filled 2019-01-06: qty 5

## 2019-01-06 MED ORDER — ACETAMINOPHEN 500 MG PO TABS
ORAL_TABLET | ORAL | Status: AC
  Filled 2019-01-06: qty 2

## 2019-01-06 MED ORDER — HYDROMORPHONE HCL 1 MG/ML IJ SOLN
0.2500 mg | INTRAMUSCULAR | Status: DC | PRN
  Administered 2019-01-06: 0.5 mg via INTRAVENOUS

## 2019-01-06 MED ORDER — SODIUM CHLORIDE 0.9 % IV SOLN
INTRAVENOUS | Status: DC
  Administered 2019-01-06: 13:00:00 via INTRAVENOUS
  Administered 2019-01-06: 500 mL via INTRAVENOUS

## 2019-01-06 MED ORDER — LIDOCAINE 2% (20 MG/ML) 5 ML SYRINGE
INTRAMUSCULAR | Status: DC | PRN
  Administered 2019-01-06: 80 mg via INTRAVENOUS

## 2019-01-06 MED ORDER — FENTANYL CITRATE (PF) 250 MCG/5ML IJ SOLN
INTRAMUSCULAR | Status: AC
  Filled 2019-01-06: qty 5

## 2019-01-06 MED ORDER — ACETAMINOPHEN 500 MG PO TABS
1000.0000 mg | ORAL_TABLET | Freq: Once | ORAL | Status: AC
  Administered 2019-01-06: 1000 mg via ORAL

## 2019-01-06 MED ORDER — ONDANSETRON HCL 4 MG/2ML IJ SOLN
INTRAMUSCULAR | Status: DC | PRN
  Administered 2019-01-06: 4 mg via INTRAVENOUS

## 2019-01-06 MED ORDER — ONDANSETRON HCL 4 MG/2ML IJ SOLN
INTRAMUSCULAR | Status: AC
  Filled 2019-01-06: qty 2

## 2019-01-06 MED ORDER — OXYCODONE-ACETAMINOPHEN 5-325 MG PO TABS
ORAL_TABLET | ORAL | Status: AC
  Filled 2019-01-06: qty 2

## 2019-01-06 MED ORDER — CEFAZOLIN SODIUM-DEXTROSE 2-4 GM/100ML-% IV SOLN
INTRAVENOUS | Status: AC
  Filled 2019-01-06: qty 100

## 2019-01-06 MED ORDER — THROMBIN (RECOMBINANT) 20000 UNITS EX SOLR
CUTANEOUS | Status: AC
  Filled 2019-01-06: qty 20000

## 2019-01-06 MED ORDER — PHENYLEPHRINE 40 MCG/ML (10ML) SYRINGE FOR IV PUSH (FOR BLOOD PRESSURE SUPPORT)
PREFILLED_SYRINGE | INTRAVENOUS | Status: AC
  Filled 2019-01-06: qty 10

## 2019-01-06 MED ORDER — OXYCODONE-ACETAMINOPHEN 5-325 MG PO TABS
2.0000 | ORAL_TABLET | Freq: Once | ORAL | Status: AC
  Administered 2019-01-06: 2 via ORAL

## 2019-01-06 MED ORDER — SUCCINYLCHOLINE CHLORIDE 200 MG/10ML IV SOSY
PREFILLED_SYRINGE | INTRAVENOUS | Status: AC
  Filled 2019-01-06: qty 10

## 2019-01-06 MED ORDER — OXYCODONE-ACETAMINOPHEN 5-325 MG PO TABS: 1.0000 | ORAL_TABLET | Freq: Four times a day (QID) | ORAL | 0 refills | Status: DC | PRN

## 2019-01-06 MED ORDER — EPHEDRINE SULFATE-NACL 50-0.9 MG/10ML-% IV SOSY
PREFILLED_SYRINGE | INTRAVENOUS | Status: DC | PRN
  Administered 2019-01-06: 10 mg via INTRAVENOUS

## 2019-01-06 MED ORDER — HYDROMORPHONE HCL 1 MG/ML IJ SOLN
INTRAMUSCULAR | Status: AC
  Filled 2019-01-06: qty 1

## 2019-01-06 SURGICAL SUPPLY — 36 items
BANDAGE ELASTIC 4 VELCRO ST LF (GAUZE/BANDAGES/DRESSINGS) ×3 IMPLANT
BANDAGE ESMARK 6X9 LF (GAUZE/BANDAGES/DRESSINGS) ×1 IMPLANT
BNDG ESMARK 6X9 LF (GAUZE/BANDAGES/DRESSINGS) ×3
CANISTER SUCT 3000ML PPV (MISCELLANEOUS) ×3 IMPLANT
CANNULA VESSEL 3MM 2 BLNT TIP (CANNULA) IMPLANT
COVER WAND RF STERILE (DRAPES) ×3 IMPLANT
CUFF TOURN SGL QUICK 18X4 (TOURNIQUET CUFF) ×3 IMPLANT
DERMABOND ADVANCED (GAUZE/BANDAGES/DRESSINGS) ×2
DERMABOND ADVANCED .7 DNX12 (GAUZE/BANDAGES/DRESSINGS) ×1 IMPLANT
ELECT REM PT RETURN 9FT ADLT (ELECTROSURGICAL) ×3
ELECTRODE REM PT RTRN 9FT ADLT (ELECTROSURGICAL) ×1 IMPLANT
GAUZE SPONGE 4X4 16PLY XRAY LF (GAUZE/BANDAGES/DRESSINGS) ×3 IMPLANT
GLOVE BIO SURGEON STRL SZ7.5 (GLOVE) ×3 IMPLANT
GLOVE BIOGEL PI IND STRL 8 (GLOVE) ×1 IMPLANT
GLOVE BIOGEL PI INDICATOR 8 (GLOVE) ×2
GOWN STRL REUS W/ TWL LRG LVL3 (GOWN DISPOSABLE) ×3 IMPLANT
GOWN STRL REUS W/TWL LRG LVL3 (GOWN DISPOSABLE) ×6
KIT BASIN OR (CUSTOM PROCEDURE TRAY) ×3 IMPLANT
KIT TURNOVER KIT B (KITS) ×3 IMPLANT
NEEDLE HYPO 25GX1X1/2 BEV (NEEDLE) ×3 IMPLANT
NS IRRIG 1000ML POUR BTL (IV SOLUTION) ×3 IMPLANT
PACK CV ACCESS (CUSTOM PROCEDURE TRAY) ×3 IMPLANT
PAD ARMBOARD 7.5X6 YLW CONV (MISCELLANEOUS) ×6 IMPLANT
SPONGE SURGIFOAM ABS GEL 100 (HEMOSTASIS) IMPLANT
SUT ETHILON 3 0 PS 1 (SUTURE) IMPLANT
SUT PROLENE 5 0 C 1 24 (SUTURE) ×3 IMPLANT
SUT PROLENE 5 0 C 1 36 (SUTURE) ×3 IMPLANT
SUT PROLENE 6 0 BV (SUTURE) ×3 IMPLANT
SUT SILK 0 TIES 10X30 (SUTURE) ×3 IMPLANT
SUT VIC AB 3-0 SH 27 (SUTURE) ×6
SUT VIC AB 3-0 SH 27X BRD (SUTURE) ×3 IMPLANT
SUT VIC AB 4-0 PS2 18 (SUTURE) ×3 IMPLANT
SUT VICRYL 4-0 PS2 18IN ABS (SUTURE) ×3 IMPLANT
TOWEL GREEN STERILE (TOWEL DISPOSABLE) ×3 IMPLANT
UNDERPAD 30X30 (UNDERPADS AND DIAPERS) ×3 IMPLANT
WATER STERILE IRR 1000ML POUR (IV SOLUTION) ×3 IMPLANT

## 2019-01-06 NOTE — Discharge Instructions (Signed)
° °  [facility]  Discharge Instructions  AV Fistula or Graft Surgery  Please refer to the following instructions for your post-procedure care. Your surgeon or physician assistant will discuss any changes with you.  Activity  You may drive the day following your surgery, if you are comfortable and no longer taking prescription pain medication. Resume full activity as the soreness in your incision resolves.  Bathing/Showering  You may shower after you go home. Keep your incision dry for 48 hours. Do not soak in a bathtub, hot tub, or swim until the incision heals completely. You may not shower if you have a hemodialysis catheter.  Incision Care  Clean your incision with mild soap and water after 48 hours. Pat the area dry with a clean towel. You do not need a bandage unless otherwise instructed. Do not apply any ointments or creams to your incision. You may have skin glue on your incision. Do not peel it off. It will come off on its own in about one week. Your arm may swell a bit after surgery. To reduce swelling use pillows to elevate your arm so it is above your heart. Your doctor will tell you if you need to lightly wrap your arm with an ACE bandage.    Leave ace bandage in place for 48 hours then remove and shower daily.  Diet  Resume your normal diet. There are not special food restrictions following this procedure. In order to heal from your surgery, it is CRITICAL to get adequate nutrition. Your body requires vitamins, minerals, and protein. Vegetables are the best source of vitamins and minerals. Vegetables also provide the perfect balance of protein. Processed food has little nutritional value, so try to avoid this.  Medications  Resume taking all of your medications. If your incision is causing pain, you may take over-the counter pain relievers such as acetaminophen (Tylenol). If you were prescribed a stronger pain medication, please be aware these  medications can cause nausea and constipation. Prevent nausea by taking the medication with a snack or meal. Avoid constipation by drinking plenty of fluids and eating foods with high amount of fiber, such as fruits, vegetables, and grains.  Do not take Tylenol if you are taking prescription pain medications.  Follow up Your surgeon may want to see you in the office following your access surgery. If so, this will be arranged at the time of your surgery.  Please call us immediately for any of the following conditions:  Increased pain, redness, drainage (pus) from your incision site Fever of 101 degrees or higher Severe or worsening pain at your incision site Hand pain or numbness.  Reduce your risk of vascular disease:  Stop smoking. If you would like help, call [facility] at 1-800-QUIT-NOW (717) 199-8693) or [place] at [place] your cholesterol Maintain a desired weight Control your diabetes Keep your blood pressure down    01/06/2019 Jon [name] [id number] February 06, 1956  Surgeon(s): Angelia Mould, MD  Procedure(s): EXCISION OF ANEURYSMAL  ARTERIOVENOUS  FISTULA LEFT ARM   If you have any questions, please call the office at [phone].

## 2019-01-06 NOTE — Progress Notes (Signed)
Call Dr Therisa Doyne, informed him patient took his medications at 6 am with orange juice no pulp.  No orders given.

## 2019-01-06 NOTE — Interval H&P Note (Signed)
History and Physical Interval Note:  01/06/2019 10:30 AM  Jon [name]  has presented today for surgery, with the diagnosis of fistula aneurysm.  The various methods of treatment have been discussed with the patient and family. After consideration of risks, benefits and other options for treatment, the patient has consented to  Procedure(s): LIGATION EXCISION OF ARTERIOVENOUS  FISTULA LEFT ARM (Left) as a surgical intervention.  The patient's history has been reviewed, patient examined, no change in status, stable for surgery.  I have reviewed the patient's chart and labs.  Questions were answered to the patient's satisfaction.     Deitra Mayo

## 2019-01-06 NOTE — Anesthesia Procedure Notes (Signed)
Procedure Name: LMA Insertion Date/Time: 01/06/2019 11:10 AM Performed by: Marsa Aris, CRNA Pre-anesthesia Checklist: Patient identified, Emergency Drugs available, Suction available and Patient being monitored Patient Re-evaluated:Patient Re-evaluated prior to induction Oxygen Delivery Method: Circle System Utilized Preoxygenation: Pre-oxygenation with 100% oxygen Induction Type: IV induction Ventilation: Mask ventilation without difficulty LMA: LMA inserted LMA Size: 4.0 Number of attempts: 1 Airway Equipment and Method: Bite block Placement Confirmation: positive ETCO2 Tube secured with: Tape Dental Injury: Teeth and Oropharynx as per pre-operative assessment

## 2019-01-06 NOTE — Transfer of Care (Signed)
Immediate Anesthesia Transfer of Care Note  Patient: Jon [name]  Procedure(s) Performed: EXCISION OF ANEURYSMAL  ARTERIOVENOUS  FISTULA LEFT ARM (Left Arm Upper)  Patient Location: PACU  Anesthesia Type:General  Level of Consciousness: awake, alert  and oriented  Airway & Oxygen Therapy: Patient Spontanous Breathing and Patient connected to face mask oxygen  Post-op Assessment: Report given to RN and Post -op Vital signs reviewed and stable  Post vital signs: Reviewed and stable  Last Vitals:  Vitals Value Taken Time  BP 159/70 01/06/2019  1:39 PM  Temp    Pulse 60 01/06/2019  1:44 PM  Resp 16 01/06/2019  1:44 PM  SpO2 98 % 01/06/2019  1:44 PM  Vitals shown include unvalidated device data.  Last Pain:  Vitals:   01/06/19 0839  TempSrc:   PainSc: 0-No pain      Patients Stated Pain Goal: 3 (01/22/93 8546)  Complications: No apparent anesthesia complications

## 2019-01-06 NOTE — Anesthesia Postprocedure Evaluation (Signed)
Anesthesia Post Note  Patient: [name]  Procedure(s) Performed: EXCISION OF ANEURYSMAL  ARTERIOVENOUS  FISTULA LEFT ARM (Left Arm Upper)     Patient location during evaluation: PACU Anesthesia Type: General Level of consciousness: awake and alert Pain management: pain level controlled Vital Signs Assessment: post-procedure vital signs reviewed and stable Respiratory status: spontaneous breathing, nonlabored ventilation and respiratory function stable Cardiovascular status: blood pressure returned to baseline and stable Postop Assessment: no apparent nausea or vomiting Anesthetic complications: no    Last Vitals:  Vitals:   01/06/19 1410 01/06/19 1426  BP: (!) 166/92 (!) 169/98  Pulse:  62  Resp:  16  Temp:    SpO2:  95%    Last Pain:  Vitals:   01/06/19 1409  TempSrc:   PainSc: 3                  Armonte Tortorella,W. EDMOND

## 2019-01-06 NOTE — Anesthesia Preprocedure Evaluation (Addendum)
Anesthesia Evaluation  Patient identified by MRN, date of birth, ID band Patient awake    Reviewed: Allergy & Precautions, H&P , NPO status , Patient's Chart, lab work & pertinent test results, reviewed documented beta blocker date and time   Airway Mallampati: II  TM Distance: >3 FB Neck ROM: Full    Dental no notable dental hx. (+) Missing, Dental Advisory Given   Pulmonary neg pulmonary ROS,    Pulmonary exam normal breath sounds clear to auscultation       Cardiovascular hypertension, Pt. on medications and Pt. on home beta blockers + Past MI   Rhythm:Regular Rate:Normal     Neuro/Psych negative neurological ROS  negative psych ROS   GI/Hepatic Neg liver ROS, GERD  Controlled,  Endo/Other  negative endocrine ROS  Renal/GU ESRF and DialysisRenal disease  negative genitourinary   Musculoskeletal   Abdominal   Peds  Hematology  (+) Blood dyscrasia, anemia ,   Anesthesia Other Findings   Reproductive/Obstetrics negative OB ROS                            Anesthesia Physical Anesthesia Plan  ASA: III  Anesthesia Plan: General   Post-op Pain Management:    Induction: Intravenous  PONV Risk Score and Plan: 3 and Ondansetron, Dexamethasone and Midazolam  Airway Management Planned: LMA  Additional Equipment:   Intra-op Plan:   Post-operative Plan: Extubation in OR  Informed Consent: I have reviewed the patients History and Physical, chart, labs and discussed the procedure including the risks, benefits and alternatives for the proposed anesthesia with the patient or authorized representative who has indicated his/her understanding and acceptance.     Dental advisory given  Plan Discussed with: CRNA  Anesthesia Plan Comments:         Anesthesia Quick Evaluation

## 2019-01-06 NOTE — Op Note (Signed)
    NAMEChandlor [name]    MRN: [id number] DOB: 31-Oct-1955    DATE OF OPERATION: 01/06/2019  PREOP DIAGNOSIS:    Aneurysmal left upper arm fistula  POSTOP DIAGNOSIS:    Same  PROCEDURE:    Excision of large aneurysmal upper arm brachiocephalic fistula  SURGEON: Judeth Cornfield. Scot Dock, MD, FACS  ASSIST: Leontine Locket, PA  ANESTHESIA: General  EBL: Minimal  INDICATIONS:    [name] is a 63 y.o. male who has a large aneurysmal left upper arm fistula which is no longer used.  He has had a successful kidney transplant and also has a functioning right arm fistula as a backup.  FINDINGS:   Massively dilated left brachiocephalic fistula this was excised through 4 incisions  TECHNIQUE:   The patient was taken to the operating room and received a general anesthetic.  The left upper extremity was prepped and draped in usual sterile fashion.  I made an elliptical incision over the proximal fistula and the fistula was dissected free down towards anastomose to the brachial artery.  This was massively dilated.  3 additional incisions were made along the fistula.  Once this had been dissected free circumferentially patient was heparinized.  Tourniquet was placed on the upper arm.  The arm was exsanguinated with an Esmarch bandage and the tourniquet inflated to 200 mmHg.  Under tourniquet control, the fistula was divided.  I excised the fistula down to the artery which was also significantly dilated.  This was closed longitudinally with a running 5-0 Prolene suture.  Tourniquet was then released.  The fistula was ligated distally and then the entire fistula removed.  Hemostasis was obtained using electrocautery.  Each of the wounds was closed with a deep layer of 3-0 Vicryl and the skin closed with 4-0 Vicryl.  Dermabond was applied.  Patient tolerated procedure well transferred recovery room in stable condition.  All needle and sponge counts were correct.  Deitra Mayo, MD, FACS [facility]  DATE OF DICTATION:   01/06/2019

## 2019-01-07 ENCOUNTER — Encounter (HOSPITAL_COMMUNITY): Payer: Self-pay | Admitting: Vascular Surgery

## 2019-02-04 ENCOUNTER — Encounter: Payer: Self-pay | Admitting: Vascular Surgery

## 2019-02-04 ENCOUNTER — Other Ambulatory Visit: Payer: Self-pay

## 2019-02-04 ENCOUNTER — Ambulatory Visit (INDEPENDENT_AMBULATORY_CARE_PROVIDER_SITE_OTHER): Payer: Self-pay | Admitting: Vascular Surgery

## 2019-02-04 ENCOUNTER — Ambulatory Visit (HOSPITAL_COMMUNITY): Payer: Medicare Other

## 2019-02-04 VITALS — BP 141/83 | HR 75 | Temp 97.6°F | Resp 20 | Ht 72.0 in | Wt 229.0 lb

## 2019-02-04 DIAGNOSIS — N186 End stage renal disease: Secondary | ICD-10-CM

## 2019-02-04 DIAGNOSIS — Z992 Dependence on renal dialysis: Secondary | ICD-10-CM

## 2019-02-04 NOTE — Progress Notes (Signed)
Patient name: Jon [name] MRN: [id number] DOB: February 23, 1956 Sex: male  REASON FOR VISIT:   Follow-up after excision of large aneurysm of left upper arm fistula  HPI:   Jon [name] is a pleasant 63 y.o. male who had a large aneurysmal left upper arm fistula which was no longer used.  He is undergone successful kidney transplant and has a functioning right arm fistula as a backup.  On 01/06/2019 he underwent excision of a large aneurysmal upper arm brachiocephalic fistula.  He comes in for routine follow-up visit.  He has no specific complaints.   Current Outpatient Medications  Medication Sig Dispense Refill  . albuterol (PROVENTIL HFA;VENTOLIN HFA) 108 (90 Base) MCG/ACT inhaler Inhale 2 puffs into the lungs every 6 (six) hours as needed for wheezing or shortness of breath. 1 Inhaler 2  . amLODipine (NORVASC) 10 MG tablet Take 10 mg by mouth at bedtime.    Marland Kitchen aspirin EC 81 MG EC tablet Take 1 tablet (81 mg total) by mouth daily. 30 tablet 3  . atorvastatin (LIPITOR) 20 MG tablet Take 1 tablet (20 mg total) by mouth daily at 6 PM. 30 tablet 3  . hydrALAZINE (APRESOLINE) 50 MG tablet Take 1 tablet (50 mg total) by mouth 3 (three) times daily.    . magnesium oxide (MAG-OX) 400 MG tablet Take 800 mg by mouth 2 (two) times a day.    . mycophenolate (CELLCEPT) 200 MG/ML suspension Take 5 mLs by mouth 2 (two) times a day.    . oxyCODONE-acetaminophen (PERCOCET) 5-325 MG tablet Take 1 tablet by mouth every 6 (six) hours as needed for severe pain. 20 tablet 0  . PHOS-NAK 280-160-250 MG PACK Take 2 Packages by mouth 3 (three) times daily.    . predniSONE 5 MG/5ML solution Take 5 mLs by mouth daily.    . Sodium Bicarbonate POWD Take 5 mLs by mouth 2 (two) times a day. Baking soda    . sulfamethoxazole-trimethoprim (BACTRIM) 200-40 MG/5ML suspension Take 10 mLs by mouth every Monday, Wednesday, and Friday.    . tacrolimus (PROGRAF) 1 MG capsule Take 4 mg by mouth 2 (two) times daily.    . carvedilol  (COREG) 25 MG tablet Take 1 tablet (25 mg total) by mouth 2 (two) times daily. 60 tablet 0  . fluticasone (FLOVENT HFA) 44 MCG/ACT inhaler Inhale 1 puff into the lungs 2 (two) times daily. (Patient not taking: Reported on 01/02/2019) 1 Inhaler 5   No current facility-administered medications for this visit.     REVIEW OF SYSTEMS:  [X]  denotes positive finding, [ ]  denotes negative finding Vascular    Leg swelling    Cardiac    Chest pain or chest pressure:    Shortness of breath upon exertion:    Short of breath when lying flat:    Irregular heart rhythm:    Constitutional    Fever or chills:     PHYSICAL EXAM:   Vitals:   02/04/19 1434  BP: (!) 141/83  Pulse: 75  Resp: 20  Temp: 97.6 F (36.4 C)  SpO2: 96%  Weight: 229 lb (103.9 kg)  Height: 6' (1.829 m)    GENERAL: The patient is a well-nourished male, in no acute distress. The vital signs are documented above. CARDIOVASCULAR: There is a regular rate and rhythm. PULMONARY: There is good air exchange bilaterally without wheezing or rales. His incisions in the left upper arm are healing nicely.   He has a palpable left radial pulse. His right  arm fistula has a good thrill.  DATA:   No new data.  MEDICAL ISSUES:   STATUS POST EXCISION OF LARGE ANEURYSMAL LEFT UPPER ARM FISTULA: Patient is doing well status post excision of his large aneurysmal left upper arm fistula.  Incisions are healing well.  I will see him back as needed.   Deitra Mayo [facility] [phone]

## 2019-04-17 ENCOUNTER — Other Ambulatory Visit: Payer: Self-pay

## 2019-04-17 ENCOUNTER — Encounter (HOSPITAL_COMMUNITY): Payer: Self-pay | Admitting: *Deleted

## 2019-04-17 ENCOUNTER — Emergency Department (HOSPITAL_COMMUNITY): Payer: Medicare Other

## 2019-04-17 ENCOUNTER — Inpatient Hospital Stay (HOSPITAL_COMMUNITY)
Admission: EM | Admit: 2019-04-17 | Discharge: 2019-04-24 | DRG: 389 | Disposition: A | Discharge status: Home / Self Care | Payer: Medicare Other | Attending: Family Medicine | Admitting: Family Medicine

## 2019-04-17 DIAGNOSIS — R918 Other nonspecific abnormal finding of lung field: Secondary | ICD-10-CM | POA: Diagnosis present

## 2019-04-17 DIAGNOSIS — N179 Acute kidney failure, unspecified: Secondary | ICD-10-CM | POA: Diagnosis present

## 2019-04-17 DIAGNOSIS — T8619 Other complication of kidney transplant: Secondary | ICD-10-CM | POA: Diagnosis present

## 2019-04-17 DIAGNOSIS — Z0189 Encounter for other specified special examinations: Secondary | ICD-10-CM

## 2019-04-17 DIAGNOSIS — K219 Gastro-esophageal reflux disease without esophagitis: Secondary | ICD-10-CM | POA: Diagnosis present

## 2019-04-17 DIAGNOSIS — Z833 Family history of diabetes mellitus: Secondary | ICD-10-CM | POA: Diagnosis not present

## 2019-04-17 DIAGNOSIS — K56609 Unspecified intestinal obstruction, unspecified as to partial versus complete obstruction: Principal | ICD-10-CM | POA: Diagnosis present

## 2019-04-17 DIAGNOSIS — E860 Dehydration: Secondary | ICD-10-CM | POA: Diagnosis present

## 2019-04-17 DIAGNOSIS — I252 Old myocardial infarction: Secondary | ICD-10-CM

## 2019-04-17 DIAGNOSIS — Z79899 Other long term (current) drug therapy: Secondary | ICD-10-CM | POA: Diagnosis not present

## 2019-04-17 DIAGNOSIS — Z20828 Contact with and (suspected) exposure to other viral communicable diseases: Secondary | ICD-10-CM | POA: Diagnosis present

## 2019-04-17 DIAGNOSIS — I2510 Atherosclerotic heart disease of native coronary artery without angina pectoris: Secondary | ICD-10-CM | POA: Diagnosis present

## 2019-04-17 DIAGNOSIS — Y830 Surgical operation with transplant of whole organ as the cause of abnormal reaction of the patient, or of later complication, without mention of misadventure at the time of the procedure: Secondary | ICD-10-CM | POA: Diagnosis present

## 2019-04-17 DIAGNOSIS — R109 Unspecified abdominal pain: Secondary | ICD-10-CM

## 2019-04-17 DIAGNOSIS — Z8249 Family history of ischemic heart disease and other diseases of the circulatory system: Secondary | ICD-10-CM | POA: Diagnosis not present

## 2019-04-17 DIAGNOSIS — Z7982 Long term (current) use of aspirin: Secondary | ICD-10-CM | POA: Diagnosis not present

## 2019-04-17 DIAGNOSIS — Z978 Presence of other specified devices: Secondary | ICD-10-CM

## 2019-04-17 DIAGNOSIS — Z7952 Long term (current) use of systemic steroids: Secondary | ICD-10-CM | POA: Diagnosis not present

## 2019-04-17 DIAGNOSIS — E785 Hyperlipidemia, unspecified: Secondary | ICD-10-CM | POA: Diagnosis present

## 2019-04-17 DIAGNOSIS — I10 Essential (primary) hypertension: Secondary | ICD-10-CM | POA: Diagnosis present

## 2019-04-17 DIAGNOSIS — R066 Hiccough: Secondary | ICD-10-CM | POA: Diagnosis not present

## 2019-04-17 LAB — SARS CORONAVIRUS 2 BY RT PCR (HOSPITAL ORDER, PERFORMED IN ~~LOC~~ HOSPITAL LAB): SARS Coronavirus 2: NEGATIVE

## 2019-04-17 LAB — URINALYSIS, ROUTINE W REFLEX MICROSCOPIC
Bilirubin Urine: NEGATIVE
Glucose, UA: NEGATIVE mg/dL
Hgb urine dipstick: NEGATIVE
Ketones, ur: NEGATIVE mg/dL
Leukocytes,Ua: NEGATIVE
Nitrite: NEGATIVE
Protein, ur: 30 mg/dL — AB
Specific Gravity, Urine: 1.014 (ref 1.005–1.030)
pH: 8 (ref 5.0–8.0)

## 2019-04-17 LAB — COMPREHENSIVE METABOLIC PANEL
ALT: 29 U/L (ref 0–44)
AST: 28 U/L (ref 15–41)
Albumin: 4.5 g/dL (ref 3.5–5.0)
Alkaline Phosphatase: 85 U/L (ref 38–126)
Anion gap: 10 (ref 5–15)
BUN: 15 mg/dL (ref 6–20)
CO2: 21 mmol/L — ABNORMAL LOW (ref 22–32)
Calcium: 9.8 mg/dL (ref 8.9–10.3)
Chloride: 108 mmol/L (ref 98–111)
Creatinine, Ser: 1.59 mg/dL — ABNORMAL HIGH (ref 0.61–1.24)
GFR calc Af Amer: 54 mL/min — ABNORMAL LOW (ref >60)
GFR calc non Af Amer: 46 mL/min — ABNORMAL LOW (ref >60)
Glucose, Bld: 160 mg/dL — ABNORMAL HIGH (ref 70–99)
Potassium: 4.1 mmol/L (ref 3.5–5.1)
Sodium: 139 mmol/L (ref 135–145)
Total Bilirubin: 1 mg/dL (ref 0.3–1.2)
Total Protein: 7.6 g/dL (ref 6.5–8.1)

## 2019-04-17 LAB — CBC
HCT: 48.7 % (ref 39.0–52.0)
Hemoglobin: 15.8 g/dL (ref 13.0–17.0)
MCH: 28.1 pg (ref 26.0–34.0)
MCHC: 32.4 g/dL (ref 30.0–36.0)
MCV: 86.7 fL (ref 80.0–100.0)
Platelets: 137 10*3/uL — ABNORMAL LOW (ref 150–400)
RBC: 5.62 MIL/uL (ref 4.22–5.81)
RDW: 13.8 % (ref 11.5–15.5)
WBC: 13 10*3/uL — ABNORMAL HIGH (ref 4.0–10.5)
nRBC: 0 % (ref 0.0–0.2)

## 2019-04-17 LAB — LIPASE, BLOOD: Lipase: 24 U/L (ref 11–51)

## 2019-04-17 LAB — MAGNESIUM: Magnesium: 1.6 mg/dL — ABNORMAL LOW (ref 1.7–2.4)

## 2019-04-17 MED ORDER — ACETAMINOPHEN 325 MG PO TABS
650.0000 mg | ORAL_TABLET | Freq: Four times a day (QID) | ORAL | Status: DC | PRN
  Administered 2019-04-22 (×2): 650 mg via ORAL
  Filled 2019-04-17 (×2): qty 2

## 2019-04-17 MED ORDER — PANTOPRAZOLE SODIUM 40 MG IV SOLR
40.0000 mg | Freq: Two times a day (BID) | INTRAVENOUS | Status: DC
  Administered 2019-04-17 – 2019-04-20 (×7): 40 mg via INTRAVENOUS
  Filled 2019-04-17 (×7): qty 40

## 2019-04-17 MED ORDER — HYDROMORPHONE HCL 1 MG/ML IJ SOLN
1.0000 mg | INTRAMUSCULAR | Status: DC | PRN
  Administered 2019-04-17 – 2019-04-19 (×8): 1 mg via INTRAVENOUS
  Filled 2019-04-17 (×8): qty 1

## 2019-04-17 MED ORDER — SODIUM CHLORIDE 0.9 % IV SOLN: Freq: Once | INTRAVENOUS | Status: DC

## 2019-04-17 MED ORDER — ENOXAPARIN SODIUM 40 MG/0.4ML ~~LOC~~ SOLN
40.0000 mg | SUBCUTANEOUS | Status: DC
  Administered 2019-04-17 – 2019-04-23 (×7): 40 mg via SUBCUTANEOUS
  Filled 2019-04-17 (×7): qty 0.4

## 2019-04-17 MED ORDER — PROMETHAZINE HCL 25 MG/ML IJ SOLN: 12.5000 mg | Freq: Four times a day (QID) | INTRAMUSCULAR | Status: DC | PRN

## 2019-04-17 MED ORDER — ACETAMINOPHEN 650 MG RE SUPP: 650.0000 mg | Freq: Four times a day (QID) | RECTAL | Status: DC | PRN

## 2019-04-17 MED ORDER — MYCOPHENOLATE MOFETIL HCL 500 MG IV SOLR
1000.0000 mg | Freq: Two times a day (BID) | INTRAVENOUS | Status: DC
  Administered 2019-04-18 – 2019-04-20 (×7): 1000 mg via INTRAVENOUS
  Filled 2019-04-17 (×11): qty 30

## 2019-04-17 MED ORDER — TACROLIMUS 1 MG PO CAPS
2.0000 mg | ORAL_CAPSULE | Freq: Two times a day (BID) | ORAL | Status: DC
  Administered 2019-04-18 – 2019-04-21 (×8): 2 mg via SUBLINGUAL
  Filled 2019-04-17 (×8): qty 2

## 2019-04-17 MED ORDER — HYDROMORPHONE HCL 1 MG/ML IJ SOLN
0.5000 mg | Freq: Once | INTRAMUSCULAR | Status: AC
  Administered 2019-04-17: 10:00:00 0.5 mg via INTRAVENOUS
  Filled 2019-04-17: qty 1

## 2019-04-17 MED ORDER — SODIUM CHLORIDE 0.9 % IV SOLN
INTRAVENOUS | Status: DC
  Administered 2019-04-17 – 2019-04-21 (×11): via INTRAVENOUS

## 2019-04-17 MED ORDER — ONDANSETRON HCL 4 MG/2ML IJ SOLN
4.0000 mg | Freq: Four times a day (QID) | INTRAMUSCULAR | Status: DC | PRN
  Administered 2019-04-20: 4 mg via INTRAVENOUS
  Filled 2019-04-17: qty 2

## 2019-04-17 MED ORDER — DEXAMETHASONE SODIUM PHOSPHATE 4 MG/ML IJ SOLN
1.5000 mg | Freq: Every day | INTRAMUSCULAR | Status: DC
  Administered 2019-04-17 – 2019-04-21 (×5): 1.52 mg via INTRAVENOUS
  Filled 2019-04-17 (×5): qty 0.38

## 2019-04-17 MED ORDER — PANTOPRAZOLE SODIUM 40 MG IV SOLR
40.0000 mg | Freq: Once | INTRAVENOUS | Status: AC
  Administered 2019-04-17: 11:00:00 40 mg via INTRAVENOUS
  Filled 2019-04-17: qty 40

## 2019-04-17 MED ORDER — LORAZEPAM 2 MG/ML IJ SOLN
1.0000 mg | Freq: Once | INTRAMUSCULAR | Status: AC
  Administered 2019-04-17: 12:00:00 1 mg via INTRAVENOUS
  Filled 2019-04-17: qty 1

## 2019-04-17 MED ORDER — ONDANSETRON HCL 4 MG/2ML IJ SOLN
4.0000 mg | Freq: Once | INTRAMUSCULAR | Status: AC
  Administered 2019-04-17: 4 mg via INTRAVENOUS
  Filled 2019-04-17: qty 2

## 2019-04-17 NOTE — ED Notes (Signed)
Patient transported to CT 

## 2019-04-17 NOTE — ED Triage Notes (Signed)
History of SBO, reports feels like same.

## 2019-04-17 NOTE — Consult Note (Addendum)
[facility] Surgery Consult Note  Jon [name] April 22, 1956  [id number].    Requesting: Couture PA-C  Chief Complaint/Reason for Consult: SBO  HPI:   Patient is a 63 year old male with PMH significant for hx of ESRD s/p renal transplant, HTN, HLD, hx of MI, GERD. He presented to [facility] with complaint of abdominal pain that started around 3 this AM. Patient reports generalized abdominal pain that is worse in epigastric area. He states pain is constant. Associated nausea and vomiting, with hematemesis. No flatus. Last BM was yesterday AM and he reports was dark.   Patient had an sbo 10-15 years ago and had surgery for that. He is still taking immunosuppressive therapy related to kidney transplant. He denies any blood thinning medications. Denies fever, chills, chest pain, SOB, urinary symptoms.   ROS: Review of Systems  Constitutional: Negative for chills and fever.  Respiratory: Negative for shortness of breath and wheezing.   Cardiovascular: Negative for chest pain and palpitations.  Gastrointestinal: Positive for abdominal pain, constipation, melena, nausea and vomiting. Negative for diarrhea.  Genitourinary: Negative for dysuria, frequency and urgency.  All other systems reviewed and are negative.   Family History  Problem Relation Age of Onset  . Hypertension Mother   . Other Mother        varicose veins  . Diabetes Father   . Hypertension Father   . Hypertension Sister   . Other Sister        varicose veins  . Other Brother        varicose veins    Past Medical History:  Diagnosis Date  . Chronic kidney disease   . Dialysis patient ([place])   . GERD (gastroesophageal reflux disease)   . History of blood transfusion   . Hyperlipidemia    takes Atorvastatin daily  . Hypertension    takes Amlodipine,Losartan,and Labetalol daily  . Myocardial infarction [facility])    lived in [place] maybe 7 years ago  . Small bowel obstruction [facility])     Past Surgical History:  Procedure  Laterality Date  . ARTERIOVENOUS GRAFT PLACEMENT    . AV FISTULA PLACEMENT Right 10/25/2015   Procedure: ARTERIOVENOUS (AV) FISTULA CREATION RIGHT FOREARM;  Surgeon: Angelia Mould, MD;  Location: [place];  Service: Vascular;  Laterality: Right;  . CARDIAC CATHETERIZATION N/A 05/05/2015   Procedure: Left Heart Cath and Coronary Angiography;  Surgeon: Charolette Forward, MD;  Location: [place] CV LAB;  Service: Cardiovascular;  Laterality: N/A;  . LIGATION OF ARTERIOVENOUS  FISTULA Left 01/06/2019   Procedure: EXCISION OF ANEURYSMAL  ARTERIOVENOUS  FISTULA LEFT ARM;  Surgeon: Angelia Mould, MD;  Location: [place];  Service: Vascular;  Laterality: Left;  . PERIPHERAL VASCULAR CATHETERIZATION N/A 08/18/2015   Procedure: Fistulagram;  Surgeon: Conrad Catron, MD;  Location: [place] [facility];  Service: Cardiovascular;  Laterality: N/A;  . PERIPHERAL VASCULAR CATHETERIZATION Right 04/19/2016   Procedure: Fistulagram;  Surgeon: Conrad [place], MD;  Location: [place] CV LAB;  Service: Cardiovascular;  Laterality: Right;  . RESECTION OF ARTERIOVENOUS FISTULA ANEURYSM Left 02/05/2013   Procedure: REPAIR OF ANEURYSM OF LEFT ARM ARTERIOVENOUS FISTULA ;  Surgeon: Serafina Mitchell, MD;  Location: [place];  Service: Vascular;  Laterality: Left;  . RESECTION OF ARTERIOVENOUS FISTULA ANEURYSM Left 03/08/2015   Procedure: REPAIR OF LEFT ARTERIOVENOUS FISTULA PSEUDOANEURYSM;  Surgeon: Angelia Mould, MD;  Location: [place];  Service: Vascular;  Laterality: Left;  . REVISON OF ARTERIOVENOUS FISTULA Left 06/02/2015  Procedure: PLICATION OF A LARGE ANEURYSM LEFT UPPER ARM BRACHIO-CEPHALIC ARTERIOVENOUS FISTULA ;  Surgeon: Angelia Mould, MD;  Location: [place];  Service: Vascular;  Laterality: Left;  . SMALL INTESTINE SURGERY      Social History:  reports that he has never smoked. He has never used smokeless tobacco. He reports that he does not drink alcohol or use drugs.  Allergies: No Known  Allergies  (Not in a hospital admission)   Blood pressure (!) 163/94, pulse 78, temperature 97.9 F (36.6 C), resp. rate 14, SpO2 96 %. Physical Exam: Physical Exam Constitutional:      General: He is not in acute distress.    Appearance: He is well-developed and overweight. He is not toxic-appearing.  HENT:     Head: Normocephalic and atraumatic.     Right Ear: External ear normal.     Left Ear: External ear normal.     Nose: Nose normal.     Mouth/Throat:     Lips: Pink.     Mouth: Mucous membranes are moist.  Eyes:     General: Lids are normal. No scleral icterus.    Extraocular Movements: Extraocular movements intact.     Conjunctiva/sclera: Conjunctivae normal.  Neck:     Musculoskeletal: Normal range of motion and neck supple.  Cardiovascular:     Rate and Rhythm: Normal rate and regular rhythm.     Pulses:          Radial pulses are 2+ on the right side and 2+ on the left side.       Dorsalis pedis pulses are 2+ on the right side and 2+ on the left side.  Pulmonary:     Effort: Pulmonary effort is normal.     Breath sounds: Normal breath sounds.  Abdominal:     General: Bowel sounds are normal. There is distension (mild).     Tenderness: There is abdominal tenderness (mild) in the epigastric area. There is no guarding or rebound.  Musculoskeletal:     Comments: ROM grossly intact in all 4 extremities  Skin:    General: Skin is warm and dry.  Neurological:     Mental Status: He is alert and oriented to person, place, and time.  Psychiatric:        Attention and Perception: Attention and perception normal.        Mood and Affect: Mood and affect normal.        Speech: Speech normal.        Behavior: Behavior normal. Behavior is cooperative.     Results for orders placed or performed during the hospital encounter of 04/17/19 (from the past 48 hour(s))  Lipase, blood     Status: None   Collection Time: 04/17/19  9:35 AM  Result Value Ref Range   Lipase 24 11  - 51 U/L    Comment: Performed at [facility], [place] [address], [place], [place] [address]  Comprehensive metabolic panel     Status: Abnormal   Collection Time: 04/17/19  9:35 AM  Result Value Ref Range   Sodium 139 135 - 145 mmol/L   Potassium 4.1 3.5 - 5.1 mmol/L    Comment: SLIGHT HEMOLYSIS   Chloride 108 98 - 111 mmol/L   CO2 21 (L) 22 - 32 mmol/L   Glucose, Bld 160 (H) 70 - 99 mg/dL   BUN 15 6 - 20 mg/dL   Creatinine, Ser 1.59 (H) 0.61 - 1.24 mg/dL   Calcium 9.8  8.9 - 10.3 mg/dL   Total Protein 7.6 6.5 - 8.1 g/dL   Albumin 4.5 3.5 - 5.0 g/dL   AST 28 15 - 41 U/L   ALT 29 0 - 44 U/L   Alkaline Phosphatase 85 38 - 126 U/L   Total Bilirubin 1.0 0.3 - 1.2 mg/dL   GFR calc non Af Amer 46 (L) >60 mL/min   GFR calc Af Amer 54 (L) >60 mL/min   Anion gap 10 5 - 15    Comment: Performed at [facility] [address], [place], [place] [address]  CBC     Status: Abnormal   Collection Time: 04/17/19  9:35 AM  Result Value Ref Range   WBC 13.0 (H) 4.0 - 10.5 K/uL   RBC 5.62 4.22 - 5.81 MIL/uL   Hemoglobin 15.8 13.0 - 17.0 g/dL   HCT 48.7 39.0 - 52.0 %   MCV 86.7 80.0 - 100.0 fL   MCH 28.1 26.0 - 34.0 pg   MCHC 32.4 30.0 - 36.0 g/dL   RDW 13.8 11.5 - 15.5 %   Platelets 137 (L) 150 - 400 K/uL   nRBC 0.0 0.0 - 0.2 %    Comment: Performed at [facility], [place] [address] [place], [place] [address]  SARS Coronavirus 2 [facility] order, Performed in [facility] hospital lab) Nasopharyngeal     Status: None   Collection Time: 04/17/19 12:09 PM   Specimen: Nasopharyngeal  Result Value Ref Range   SARS Coronavirus 2 NEGATIVE NEGATIVE    Comment: (NOTE) If result is NEGATIVE SARS-CoV-2 target nucleic acids are NOT DETECTED. The SARS-CoV-2 RNA is generally detectable in upper and lower  respiratory specimens during the acute phase of infection. The lowest  concentration of SARS-CoV-2 viral copies this assay can detect is 250  copies / mL. A negative result does not  preclude SARS-CoV-2 infection  and should not be used as the sole basis for treatment or other  patient management decisions.  A negative result may occur with  improper specimen collection / handling, submission of specimen other  than nasopharyngeal swab, presence of viral mutation(s) within the  areas targeted by this assay, and inadequate number of viral copies  (<250 copies / mL). A negative result must be combined with clinical  observations, patient history, and epidemiological information. If result is POSITIVE SARS-CoV-2 target nucleic acids are DETECTED. The SARS-CoV-2 RNA is generally detectable in upper and lower  respiratory specimens dur ing the acute phase of infection.  Positive  results are indicative of active infection with SARS-CoV-2.  Clinical  correlation with patient history and other diagnostic information is  necessary to determine patient infection status.  Positive results do  not rule out bacterial infection or co-infection with other viruses. If result is PRESUMPTIVE POSTIVE SARS-CoV-2 nucleic acids MAY BE PRESENT.   A presumptive positive result was obtained on the submitted specimen  and confirmed on repeat testing.  While 2019 novel coronavirus  (SARS-CoV-2) nucleic acids may be present in the submitted sample  additional confirmatory testing may be necessary for epidemiological  and / or clinical management purposes  to differentiate between  SARS-CoV-2 and other Sarbecovirus currently known to infect humans.  If clinically indicated additional testing with an alternate test  methodology [phone]) is advised. The SARS-CoV-2 RNA is generally  detectable in upper and lower respiratory sp ecimens during the acute  phase of infection. The expected result is Negative. Fact Sheet for Patients:  StrictlyIdeas.no Fact  Sheet for Healthcare Providers: BankingDealers.co.za This test is not yet approved or cleared by  the [place] and has been authorized for detection and/or diagnosis of SARS-CoV-2 by FDA under an Emergency Use Authorization (EUA).  This EUA will remain in effect (meaning this test can be used) for the duration of the COVID-19 declaration under Section 564(b)(1) of the Act, 21 U.S.C. section 360bbb-3(b)(1), unless the authorization is terminated or revoked sooner. Performed at [facility], [place] [address] [place], [place] [address]   Urinalysis, Routine w reflex microscopic     Status: Abnormal   Collection Time: 04/17/19  1:30 PM  Result Value Ref Range   Color, Urine YELLOW YELLOW   APPearance CLOUDY (A) CLEAR   Specific Gravity, Urine 1.014 1.005 - 1.030   pH 8.0 5.0 - 8.0   Glucose, UA NEGATIVE NEGATIVE mg/dL   Hgb urine dipstick NEGATIVE NEGATIVE   Bilirubin Urine NEGATIVE NEGATIVE   Ketones, ur NEGATIVE NEGATIVE mg/dL   Protein, ur 30 (A) NEGATIVE mg/dL   Nitrite NEGATIVE NEGATIVE   Leukocytes,Ua NEGATIVE NEGATIVE   RBC / HPF 0-5 0 - 5 RBC/hpf   WBC, UA 0-5 0 - 5 WBC/hpf   Bacteria, UA RARE (A) NONE SEEN   Amorphous Crystal PRESENT     Comment: Performed at [facility] [address] [place], [place] [address]   Ct Abdomen Pelvis Wo Contrast  Result Date: 04/17/2019 CLINICAL DATA:  Diffuse abdominal pain beginning this morning with nausea and vomiting. Previous renal transplant 2018. EXAM: CT ABDOMEN AND PELVIS WITHOUT CONTRAST TECHNIQUE: Multidetector CT imaging of the abdomen and pelvis was performed following the standard protocol without IV contrast. COMPARISON:  Abdominopelvic CT 03/31/2012 and chest CT 05/02/2015 FINDINGS: Lower chest: Lung bases demonstrate no significant consolidation or effusion. There is a 6-7 mm nodule over the medial right base. There is a 22mm and 5 mm nodule present over the right middle lobe. These are not well seen on the prior exam. Borderline cardiomegaly. Calcified plaque over the left main, left anterior descending  and right coronary arteries. Mild calcified plaque over the descending thoracic aorta. Hepatobiliary: Liver, gallbladder and biliary tree are normal. Pancreas: Normal. Spleen: Normal. Adrenals/Urinary Tract: Adrenal glands are normal. Atrophic native kidneys with a few calcifications. Transplant kidney in the right lower quadrant without hydronephrosis or nephrolithiasis. Visualized right ureter is normal. 6 mm stone over the left posterior dependent portion of the bladder. Stomach/Bowel: Stomach is normal. Surgical suture line over a loop of small bowel over the mid to lower abdomen just left of midline. There are several mildly dilated fluid-filled small bowel loops in the left mid to upper abdomen measuring up to 4.2 cm in diameter. There are 2 adjacent abruption narrowed small bowel loops in the left mid abdomen as these findings may be seen with a closed loop obstruction. Appendix not visualized. Diverticulosis of the colon. Air and stool present throughout the colon. Surgical suture line over the right colon. Vascular/Lymphatic: Mild calcified plaque over the abdominal aorta and iliac arteries. No significant adenopathy. Reproductive: Normal. Other: No significant free fluid. Musculoskeletal: No acute findings. IMPRESSION: 1. Fluid-filled dilated small bowel loops in the left mid to upper abdomen. Two adjacent abruptly narrowed small bowel loops left of midline in the mid abdomen as findings may be due to closed loop obstruction from adhesions. 2. Colonic diverticulosis. Postsurgical changes of the large and small bowel. 3. Several small nodules over the right middle and lower lobe with  the largest measuring 6 mm. Recommend further evaluation with noncontrast chest CT. 4. Aortic Atherosclerosis (ICD10-I70.0). Atherosclerotic coronary artery disease. 5. Transplant kidney right lower quadrant. Atrophic native kidneys. 6.  6-7 mm bladder stone. Electronically Signed   By: Marin Olp M.D.   On: 04/17/2019 11:41    Dg Abdomen 1 View  Result Date: 04/17/2019 CLINICAL DATA:  Nasogastric tube placement EXAM: ABDOMEN - 1 VIEW COMPARISON:  CT 04/17/2019 FINDINGS: Nasogastric tube courses below the diaphragm with distal tip and side hole terminating in the gastric body. Moderate stool projects throughout the colon. No gross free intraperitoneal air. No gas-filled loops of small bowel. IMPRESSION: NG tube appropriately positioned within the stomach. Electronically Signed   By: Davina Poke M.D.   On: 04/17/2019 13:39      Assessment/Plan Hx of ESRD s/p kidney transplant in 2018 (kidney sits in RLQ) HTN HLD Hx of MI GERD Pulmonary nodules seen on CT scan in right middle/lower lobe - radiology suggest follow up CT of chest  SBO  - CT today with dilated loops in left mid to upper abdomen, some concern for closed loop obstruction  - clinically patient is ttp in epigastrium but not peritonitic  - NGT for decompression and bowel rest  - NPO  - repeat abdominal film in the AM, possibly start small bowel protocol  FEN: NPO, IVF; NGT to LIWS VTE: SCDs ID: no abx indicated at this time  Brigid Re, [facility] Surgery 04/17/2019, 3:13 PM Pager: [phone] Consults: [phone]  Agree with above. Some abdominal tenderness, but not well localized.  Has BS. CT scan shows most of the dilated SB loops in the LUQ.  Alphonsa Overall, MD, [facility] Surgery Pager: [phone] Office phone:  [phone]

## 2019-04-17 NOTE — ED Notes (Signed)
ED TO INPATIENT HANDOFF REPORT  ED Nurse Name and Phone #: Annie Main [phone]  S Name/Age/Gender [employer] 63 y.o. male Room/Bed: 045C/045C  Code Status   Code Status: Prior  Home/SNF/Other Home Patient oriented to: self, place, time and situation Is this baseline? Yes   Triage Complete: Triage complete  Chief Complaint N/V/D  Triage Note Pt to ER for evaluation of generalized abdominal pain onset this morning at 3 AM. Reports nausea and vomiting. Last BM yesterday, reports was normal. Reports kidney transplant in 2018.   History of SBO, reports feels like same.    Allergies No Known Allergies  Level of Care/Admitting Diagnosis ED Disposition    ED Disposition Condition Comment   Admit  Hospital Area: [place] [[id number]]  Level of Care: Med-Surg [16]  Covid Evaluation: Confirmed COVID Negative  Diagnosis: SBO (small bowel obstruction) ([place]) [[id number]]  Admitting Physician: Cephus Slater  Attending Physician: CHAMBLISS, MARSHALL L [[id number]]  Estimated length of stay: past midnight tomorrow  Certification:: I certify this patient will need inpatient services for at least 2 midnights  PT Class (Do Not Modify): Inpatient [101]  PT Acc Code (Do Not Modify): Private [1]       B Medical/Surgery History Past Medical History:  Diagnosis Date  . Chronic kidney disease   . Dialysis patient ([place])   . GERD (gastroesophageal reflux disease)   . History of blood transfusion   . Hyperlipidemia    takes Atorvastatin daily  . Hypertension    takes Amlodipine,Losartan,and Labetalol daily  . Myocardial infarction [facility])    lived in [place] maybe 7 years ago  . Small bowel obstruction [facility])    Past Surgical History:  Procedure Laterality Date  . ARTERIOVENOUS GRAFT PLACEMENT    . AV FISTULA PLACEMENT Right 10/25/2015   Procedure: ARTERIOVENOUS (AV) FISTULA CREATION RIGHT FOREARM;  Surgeon: Angelia Mould, MD;  Location: [place];  Service:  Vascular;  Laterality: Right;  . CARDIAC CATHETERIZATION N/A 05/05/2015   Procedure: Left Heart Cath and Coronary Angiography;  Surgeon: Charolette Forward, MD;  Location: [place] [facility];  Service: Cardiovascular;  Laterality: N/A;  . LIGATION OF ARTERIOVENOUS  FISTULA Left 01/06/2019   Procedure: EXCISION OF ANEURYSMAL  ARTERIOVENOUS  FISTULA LEFT ARM;  Surgeon: Angelia Mould, MD;  Location: [place];  Service: Vascular;  Laterality: Left;  . PERIPHERAL VASCULAR CATHETERIZATION N/A 08/18/2015   Procedure: Fistulagram;  Surgeon: Conrad Bandon, MD;  Location: [place] [facility];  Service: Cardiovascular;  Laterality: N/A;  . PERIPHERAL VASCULAR CATHETERIZATION Right 04/19/2016   Procedure: Fistulagram;  Surgeon: Conrad Becker, MD;  Location: [place] [facility];  Service: Cardiovascular;  Laterality: Right;  . RESECTION OF ARTERIOVENOUS FISTULA ANEURYSM Left 02/05/2013   Procedure: REPAIR OF ANEURYSM OF LEFT ARM ARTERIOVENOUS FISTULA ;  Surgeon: Serafina Mitchell, MD;  Location: [place];  Service: Vascular;  Laterality: Left;  . RESECTION OF ARTERIOVENOUS FISTULA ANEURYSM Left 03/08/2015   Procedure: REPAIR OF LEFT ARTERIOVENOUS FISTULA PSEUDOANEURYSM;  Surgeon: Angelia Mould, MD;  Location: [place];  Service: Vascular;  Laterality: Left;  . REVISON OF ARTERIOVENOUS FISTULA Left 22/03/7988   Procedure: PLICATION OF A LARGE ANEURYSM LEFT UPPER ARM BRACHIO-CEPHALIC ARTERIOVENOUS FISTULA ;  Surgeon: Angelia Mould, MD;  Location: [place];  Service: Vascular;  Laterality: Left;  . SMALL INTESTINE SURGERY       A IV Location/Drains/Wounds Patient Lines/Drains/Airways Status   Active Line/Drains/Airways    Name:  Placement date:   Placement time:   Site:   Days:   Peripheral IV 04/17/19 Right Antecubital   04/17/19    1006    Antecubital   less than 1   Fistula / Graft Right Forearm Arteriovenous fistula   10/25/15    1014    Forearm   1270   NG/OG Tube Nasogastric 14 Fr. Right nare  Xray;Aucultation   04/17/19    -    Right nare   less than 1   Incision (Closed) 03/08/15 Arm Left   03/08/15    1437     1501   Incision (Closed) 05/05/15 Groin Right   05/05/15    -     1443   Incision (Closed) 06/02/15 Arm Left   06/02/15    1029     1415   Incision (Closed) 10/25/15 Arm Right   10/25/15    1019     1270   Incision (Closed) 01/06/19 Arm Left   01/06/19    1206     101          Intake/Output Last 24 hours No intake or output data in the 24 hours ending 04/17/19 2014  Labs/Imaging Results for orders placed or performed during the hospital encounter of 04/17/19 (from the past 48 hour(s))  Lipase, blood     Status: None   Collection Time: 04/17/19  9:35 AM  Result Value Ref Range   Lipase 24 11 - 51 U/L    Comment: Performed at [facility], [place] [address], [place], [place] [address]  Comprehensive metabolic panel     Status: Abnormal   Collection Time: 04/17/19  9:35 AM  Result Value Ref Range   Sodium 139 135 - 145 mmol/L   Potassium 4.1 3.5 - 5.1 mmol/L    Comment: SLIGHT HEMOLYSIS   Chloride 108 98 - 111 mmol/L   CO2 21 (L) 22 - 32 mmol/L   Glucose, Bld 160 (H) 70 - 99 mg/dL   BUN 15 6 - 20 mg/dL   Creatinine, Ser 1.59 (H) 0.61 - 1.24 mg/dL   Calcium 9.8 8.9 - 10.3 mg/dL   Total Protein 7.6 6.5 - 8.1 g/dL   Albumin 4.5 3.5 - 5.0 g/dL   AST 28 15 - 41 U/L   ALT 29 0 - 44 U/L   Alkaline Phosphatase 85 38 - 126 U/L   Total Bilirubin 1.0 0.3 - 1.2 mg/dL   GFR calc non Af Amer 46 (L) >60 mL/min   GFR calc Af Amer 54 (L) >60 mL/min   Anion gap 10 5 - 15    Comment: Performed at [facility] [address] [place], [place] [address]  CBC     Status: Abnormal   Collection Time: 04/17/19  9:35 AM  Result Value Ref Range   WBC 13.0 (H) 4.0 - 10.5 K/uL   RBC 5.62 4.22 - 5.81 MIL/uL   Hemoglobin 15.8 13.0 - 17.0 g/dL   HCT 48.7 39.0 - 52.0 %   MCV 86.7 80.0 - 100.0 fL   MCH 28.1 26.0 - 34.0 pg   MCHC 32.4 30.0 - 36.0 g/dL   RDW 13.8 11.5 -  15.5 %   Platelets 137 (L) 150 - 400 K/uL   nRBC 0.0 0.0 - 0.2 %    Comment: Performed at [facility], [place] [address] [place], [place] [address]  SARS Coronavirus 2 [facility] order, Performed in [facility] hospital lab)  Nasopharyngeal     Status: None   Collection Time: 04/17/19 12:09 PM   Specimen: Nasopharyngeal  Result Value Ref Range   SARS Coronavirus 2 NEGATIVE NEGATIVE    Comment: (NOTE) If result is NEGATIVE SARS-CoV-2 target nucleic acids are NOT DETECTED. The SARS-CoV-2 RNA is generally detectable in upper and lower  respiratory specimens during the acute phase of infection. The lowest  concentration of SARS-CoV-2 viral copies this assay can detect is 250  copies / mL. A negative result does not preclude SARS-CoV-2 infection  and should not be used as the sole basis for treatment or other  patient management decisions.  A negative result may occur with  improper specimen collection / handling, submission of specimen other  than nasopharyngeal swab, presence of viral mutation(s) within the  areas targeted by this assay, and inadequate number of viral copies  (<250 copies / mL). A negative result must be combined with clinical  observations, patient history, and epidemiological information. If result is POSITIVE SARS-CoV-2 target nucleic acids are DETECTED. The SARS-CoV-2 RNA is generally detectable in upper and lower  respiratory specimens dur ing the acute phase of infection.  Positive  results are indicative of active infection with SARS-CoV-2.  Clinical  correlation with patient history and other diagnostic information is  necessary to determine patient infection status.  Positive results do  not rule out bacterial infection or co-infection with other viruses. If result is PRESUMPTIVE POSTIVE SARS-CoV-2 nucleic acids MAY BE PRESENT.   A presumptive positive result was obtained on the submitted specimen  and confirmed on repeat testing.  While 2019 novel  coronavirus  (SARS-CoV-2) nucleic acids may be present in the submitted sample  additional confirmatory testing may be necessary for epidemiological  and / or clinical management purposes  to differentiate between  SARS-CoV-2 and other Sarbecovirus currently known to infect humans.  If clinically indicated additional testing with an alternate test  methodology [phone]) is advised. The SARS-CoV-2 RNA is generally  detectable in upper and lower respiratory sp ecimens during the acute  phase of infection. The expected result is Negative. Fact Sheet for Patients:  StrictlyIdeas.no Fact Sheet for Healthcare Providers: BankingDealers.co.za This test is not yet approved or cleared by the [place] FDA and has been authorized for detection and/or diagnosis of SARS-CoV-2 by FDA under an Emergency Use Authorization (EUA).  This EUA will remain in effect (meaning this test can be used) for the duration of the COVID-19 declaration under Section 564(b)(1) of the Act, 21 U.S.C. section 360bbb-3(b)(1), unless the authorization is terminated or revoked sooner. Performed at [facility], [place] [address], [place], [place] [address]   Urinalysis, Routine w reflex microscopic     Status: Abnormal   Collection Time: 04/17/19  1:30 PM  Result Value Ref Range   Color, Urine YELLOW YELLOW   APPearance CLOUDY (A) CLEAR   Specific Gravity, Urine 1.014 1.005 - 1.030   pH 8.0 5.0 - 8.0   Glucose, UA NEGATIVE NEGATIVE mg/dL   Hgb urine dipstick NEGATIVE NEGATIVE   Bilirubin Urine NEGATIVE NEGATIVE   Ketones, ur NEGATIVE NEGATIVE mg/dL   Protein, ur 30 (A) NEGATIVE mg/dL   Nitrite NEGATIVE NEGATIVE   Leukocytes,Ua NEGATIVE NEGATIVE   RBC / HPF 0-5 0 - 5 RBC/hpf   WBC, UA 0-5 0 - 5 WBC/hpf   Bacteria, UA RARE (A) NONE SEEN   Amorphous Crystal PRESENT     Comment: Performed at [facility] [address],  [place], [place] [address]  Magnesium      Status: Abnormal   Collection Time: 04/17/19  2:20 PM  Result Value Ref Range   Magnesium 1.6 (L) 1.7 - 2.4 mg/dL    Comment: Performed at [facility] [address] [place],  [address]   Ct Abdomen Pelvis Wo Contrast  Result Date: 04/17/2019 CLINICAL DATA:  Diffuse abdominal pain beginning this morning with nausea and vomiting. Previous renal transplant 2018. EXAM: CT ABDOMEN AND PELVIS WITHOUT CONTRAST TECHNIQUE: Multidetector CT imaging of the abdomen and pelvis was performed following the standard protocol without IV contrast. COMPARISON:  Abdominopelvic CT 03/31/2012 and chest CT 05/02/2015 FINDINGS: Lower chest: Lung bases demonstrate no significant consolidation or effusion. There is a 6-7 mm nodule over the medial right base. There is a 2mm and 5 mm nodule present over the right middle lobe. These are not well seen on the prior exam. Borderline cardiomegaly. Calcified plaque over the left main, left anterior descending and right coronary arteries. Mild calcified plaque over the descending thoracic aorta. Hepatobiliary: Liver, gallbladder and biliary tree are normal. Pancreas: Normal. Spleen: Normal. Adrenals/Urinary Tract: Adrenal glands are normal. Atrophic native kidneys with a few calcifications. Transplant kidney in the right lower quadrant without hydronephrosis or nephrolithiasis. Visualized right ureter is normal. 6 mm stone over the left posterior dependent portion of the bladder. Stomach/Bowel: Stomach is normal. Surgical suture line over a loop of small bowel over the mid to lower abdomen just left of midline. There are several mildly dilated fluid-filled small bowel loops in the left mid to upper abdomen measuring up to 4.2 cm in diameter. There are 2 adjacent abruption narrowed small bowel loops in the left mid abdomen as these findings may be seen with a closed loop obstruction. Appendix not visualized. Diverticulosis of the colon. Air and stool present throughout the  colon. Surgical suture line over the right colon. Vascular/Lymphatic: Mild calcified plaque over the abdominal aorta and iliac arteries. No significant adenopathy. Reproductive: Normal. Other: No significant free fluid. Musculoskeletal: No acute findings. IMPRESSION: 1. Fluid-filled dilated small bowel loops in the left mid to upper abdomen. Two adjacent abruptly narrowed small bowel loops left of midline in the mid abdomen as findings may be due to closed loop obstruction from adhesions. 2. Colonic diverticulosis. Postsurgical changes of the large and small bowel. 3. Several small nodules over the right middle and lower lobe with the largest measuring 6 mm. Recommend further evaluation with noncontrast chest CT. 4. Aortic Atherosclerosis (ICD10-I70.0). Atherosclerotic coronary artery disease. 5. Transplant kidney right lower quadrant. Atrophic native kidneys. 6.  6-7 mm bladder stone. Electronically Signed   By: Marin Olp M.D.   On: 04/17/2019 11:41   Dg Abdomen 1 View  Result Date: 04/17/2019 CLINICAL DATA:  Nasogastric tube placement EXAM: ABDOMEN - 1 VIEW COMPARISON:  CT 04/17/2019 FINDINGS: Nasogastric tube courses below the diaphragm with distal tip and side hole terminating in the gastric body. Moderate stool projects throughout the colon. No gross free intraperitoneal air. No gas-filled loops of small bowel. IMPRESSION: NG tube appropriately positioned within the stomach. Electronically Signed   By: Davina Poke M.D.   On: 04/17/2019 13:39    Pending Labs [employer] (From admission, onward)    Start     Ordered   Signed and Held  HIV antibody (Routine Testing)  Once,   R     Signed and Held   Signed and [employer] metabolic panel  Tomorrow morning,  R     Signed and Held   Signed and Held  CBC  Tomorrow morning,   R     Signed and Held          Vitals/Pain Today's Vitals   04/17/19 1630 04/17/19 1800 04/17/19 1915 04/17/19 1945  BP: (!) 169/106 (!) 192/114 (!) 160/99  (!) 140/102  Pulse: 80 79    Resp: 15 17 18 15   Temp:      SpO2: 96% 97%    PainSc:        Isolation Precautions No active isolations  Medications Medications  HYDROmorphone (DILAUDID) injection 1 mg (1 mg Intravenous Given 04/17/19 1818)  pantoprazole (PROTONIX) injection 40 mg (has no administration in time range)  0.9 %  sodium chloride infusion ( Intravenous New Bag/Given 04/17/19 1548)  ondansetron (ZOFRAN) injection 4 mg (has no administration in time range)  promethazine (PHENERGAN) injection 12.5-25 mg (has no administration in time range)  HYDROmorphone (DILAUDID) injection 0.5 mg (0.5 mg Intravenous Given 04/17/19 1010)  ondansetron (ZOFRAN) injection 4 mg (4 mg Intravenous Given 04/17/19 1129)  pantoprazole (PROTONIX) injection 40 mg (40 mg Intravenous Given 04/17/19 1129)  LORazepam (ATIVAN) injection 1 mg (1 mg Intravenous Given 04/17/19 1228)    Mobility walks     Focused Assessments GI   R Recommendations: See Admitting Provider Note  Report given to:   Additional Notes:

## 2019-04-17 NOTE — ED Notes (Signed)
Patient has had a kidney transplant and does not get dialysis at this time.  Reports surgery on his small bowel 2 years ago.

## 2019-04-17 NOTE — H&P (Addendum)
[facility] Admission History and Physical Service Pager: [phone]  Patient name: Jon [name] Medical record number: [id number] Date of birth: 05-23-1956 Age: 63 y.o. Gender: male  Primary Care Provider: Guadalupe Dawn, MD Consultants: General Surgery Code Status: FULL Emergency contact: [place] Bing, brother, at [phone] Chief Complaint: abdominal pain  Assessment and Plan: Jon [name] is a 63 y.o. male presenting with abdominal pain and was found to have an SBO. PMH is significant for CKD s/p renal transplant in 06/2017, CAD, hypertension, and hyperlipidemia.  SBO Patient with acute onset of upper abdominal pain, nausea, and vomiting around 3 AM on 9/18.  Reports he had a small bowel obstruction about 10 years ago that required removal of some of his small intestines.  Denies blood in stool and urine.  Last bowel movement was yesterday and last ate yesterday. On exam, patient is tender to palpation in the bilateral upper quadrants without peritoneal signs. CT abdomen pelvis done indicated closed-loop small bowel obstruction.  General surgery following considering starting SBO protocol.  NG tube placed and maintenance IV fluids.  Patient is immunosuppressed following his right kidney transplant.  Nephrologist, Dr. Hollie Salk, aware and is following to help transition PO immunosuppressant medications to IV or sublingual forms while NPO. -Admit to [facility], attending Dr. Erin Hearing -General surgery following, appreciate recommendations -Consider SBO protocol  -Repeat KUB in the morning -NPO -mIVF @150  mL/hr NS -Protonix IV -Dilaudid 1mg  q2h PRN -Zofran 4 mg q6h PRN -Phenergan 12.5-25mg  q6h PRN -NG tube  CAD  Hx of MI Heart catheterization in 2016 was significant for a proximate to mid LAD lesion. Patient had an MI 2006. Denies chest pain and shortness of breath.  He takes ASA 81 mg daily, Coreg 25 mg twice daily at home. -Restart these medications when no  longer NPO   Hypertension Blood pressure 150/90 on admission.  Patient takes carvedilol, hydralazine 50 mg 3 times daily, and amlodipine 10 mg daily at home. -Restart these medications when no longer NPO -Consider PRN IV medications if needed while n.p.o.  Hyperlipidemia Stable. LDL 82 on 05/30/13. Takes atorvastatin 20 mg daily. -Restart medication when no longer NPO  Right kidney transplant  ESRD Patient with history of uncontrolled hypertension and was on dialysis from 2006-2018.  Patient received a right kidney transplant in December 2018.  Nephrology contacted regarding patient's immunosuppressive medication adjustments while he is NPO.  Dr. Hollie Salk will adjust renal medications appropriately.  Home regimen includes Cellcept, sodium bicarb powder, tacrolimus 4 mg twice daily, prednisone 5 mg daily, magnesium oxide 800 mg twice daily, and P-Na-K supplementations. -Dr. Hollie Salk consulted, appreciate recommendations  -Follow-up on medication recommendation   FEN/GI: NPO, mIVF, Protonix  Prophylaxis: lovenox  Disposition: Home  History of Present Illness:   Jon [name] is a 63 y.o. male presenting with acute onset nonradiating generalized abdominal pain with associated nausea, and vomiting.  Pain described as aching and rated 7 out of 10 currently.  He started vomiting and having abdominal pain at about 3 am this morning.  He reports similar symptoms occurred a couple years ago when at his some of his intestines removed.  He endorses a small amount of hematemesis but no bloody stool.  His last bowel movement was normal yesterday.  Last ate yesterday morning.  Denies dysuria, hematochezia and hematuria.   In the ED, patient's CT ABD/Pelvis indicated fluid-filled dilated small bowel loops in the mid left mid to upper abdomen with two adjacent abruptly narrowed small bowel loops left of midline  in the mid abdomen consistent with closed-loop obstruction from adhesions.  General surgery was consulted  and will see patient for his small bowel obstruction..  We will admit patient for medical management.   Review Of Systems: Per HPI with the following additions:  Review of Systems  Constitutional: Negative for chills and fever.  HENT: Negative for congestion.   Respiratory: Negative for cough.   Cardiovascular: Negative for chest pain and leg swelling.  Gastrointestinal: Positive for abdominal pain, nausea and vomiting. Negative for blood in stool.  Genitourinary: Negative for dysuria and urgency.  Musculoskeletal: Negative for back pain.  Skin: Negative for rash.  Neurological: Negative for dizziness and headaches.    Patient Active Problem List   Diagnosis Date Noted  . SBO (small bowel obstruction) ([place]) 04/17/2019  . Kidney transplant recipient 08/26/2017  . Cough 01/24/2017  . Anemia due to chronic kidney disease   . Thrombocytopenia ([place])   . Encounter to establish care 10/16/2016  . Right ankle pain 09/07/2016  . HTN (hypertension) 05/07/2012    Past Medical History: Past Medical History:  Diagnosis Date  . Chronic kidney disease   . Dialysis patient ([place])   . GERD (gastroesophageal reflux disease)   . History of blood transfusion   . Hyperlipidemia    takes Atorvastatin daily  . Hypertension    takes Amlodipine,Losartan,and Labetalol daily  . Myocardial infarction [facility])    lived in [place] maybe 7 years ago  . Small bowel obstruction [facility])     Past Surgical History: Past Surgical History:  Procedure Laterality Date  . ARTERIOVENOUS GRAFT PLACEMENT    . AV FISTULA PLACEMENT Right 10/25/2015   Procedure: ARTERIOVENOUS (AV) FISTULA CREATION RIGHT FOREARM;  Surgeon: Angelia Mould, MD;  Location: [facility];  Service: Vascular;  Laterality: Right;  . CARDIAC CATHETERIZATION N/A 05/05/2015   Procedure: Left Heart Cath and Coronary Angiography;  Surgeon: Charolette Forward, MD;  Location: [facility] CV LAB;  Service: Cardiovascular;  Laterality: N/A;  . LIGATION OF  ARTERIOVENOUS  FISTULA Left 01/06/2019   Procedure: EXCISION OF ANEURYSMAL  ARTERIOVENOUS  FISTULA LEFT ARM;  Surgeon: Angelia Mould, MD;  Location: [facility];  Service: Vascular;  Laterality: Left;  . PERIPHERAL VASCULAR CATHETERIZATION N/A 08/18/2015   Procedure: Fistulagram;  Surgeon: Conrad Gunters[place], MD;  Location: [place] CV [facility];  Service: Cardiovascular;  Laterality: N/A;  . PERIPHERAL VASCULAR CATHETERIZATION Right 04/19/2016   Procedure: Fistulagram;  Surgeon: Conrad Yale, MD;  Location: [facility] CV [facility];  Service: Cardiovascular;  Laterality: Right;  . RESECTION OF ARTERIOVENOUS FISTULA ANEURYSM Left 02/05/2013   Procedure: REPAIR OF ANEURYSM OF LEFT ARM ARTERIOVENOUS FISTULA ;  Surgeon: Serafina Mitchell, MD;  Location: [facility];  Service: Vascular;  Laterality: Left;  . RESECTION OF ARTERIOVENOUS FISTULA ANEURYSM Left 03/08/2015   Procedure: REPAIR OF LEFT ARTERIOVENOUS FISTULA PSEUDOANEURYSM;  Surgeon: Angelia Mould, MD;  Location: [facility];  Service: Vascular;  Laterality: Left;  . REVISON OF ARTERIOVENOUS FISTULA Left 16/12/628   Procedure: PLICATION OF A LARGE ANEURYSM LEFT UPPER ARM BRACHIO-CEPHALIC ARTERIOVENOUS FISTULA ;  Surgeon: Angelia Mould, MD;  Location: [facility];  Service: Vascular;  Laterality: Left;  . SMALL INTESTINE SURGERY      Social History: Social History   Tobacco Use  . Smoking status: Never Smoker  . Smokeless tobacco: Never Used  Substance Use Topics  . Alcohol use: No    Alcohol/week: 0.0 standard drinks  . Drug use: No   Additional  social history:   Please also refer to relevant sections of EMR.  Family History: Family History  Problem Relation Age of Onset  . Hypertension Mother   . Other Mother        varicose veins  . Diabetes Father   . Hypertension Father   . Hypertension Sister   . Other Sister        varicose veins  . Other Brother        varicose veins    Allergies and Medications: No Known Allergies No current  facility-administered medications on file prior to encounter.    Current Outpatient Medications on File Prior to Encounter  Medication Sig Dispense Refill  . albuterol (PROVENTIL HFA;VENTOLIN HFA) 108 (90 Base) MCG/ACT inhaler Inhale 2 puffs into the lungs every 6 (six) hours as needed for wheezing or shortness of breath. 1 Inhaler 2  . amLODipine (NORVASC) 10 MG tablet Take 10 mg by mouth at bedtime.    Marland Kitchen aspirin EC 81 MG EC tablet Take 1 tablet (81 mg total) by mouth daily. 30 tablet 3  . atorvastatin (LIPITOR) 20 MG tablet Take 1 tablet (20 mg total) by mouth daily at 6 PM. 30 tablet 3  . carvedilol (COREG) 25 MG tablet Take 1 tablet (25 mg total) by mouth 2 (two) times daily. 60 tablet 0  . hydrALAZINE (APRESOLINE) 50 MG tablet Take 1 tablet (50 mg total) by mouth 3 (three) times daily.    . magnesium oxide (MAG-OX) 400 MG tablet Take 800 mg by mouth 2 (two) times a day. Take 2 hrs before or after Cellcept    . mycophenolate (CELLCEPT) 200 MG/ML suspension Take 5 mLs by mouth 2 (two) times a day.    . oxyCODONE-acetaminophen (PERCOCET) 5-325 MG tablet Take 1 tablet by mouth every 6 (six) hours as needed for severe pain. 20 tablet 0  . PHOS-NAK 280-160-250 MG PACK Take 2 Packages by mouth 3 (three) times daily.    . predniSONE 5 MG/5ML solution Take 5 mLs by mouth daily.    . Sodium Bicarbonate POWD Take 5 mLs by mouth 2 (two) times a day. Baking soda    . sulfamethoxazole-trimethoprim (BACTRIM) 200-40 MG/5ML suspension Take 10 mLs by mouth every Monday, Wednesday, and Friday.    . tacrolimus (PROGRAF) 1 MG capsule Take 4 mg by mouth 2 (two) times daily.    . fluticasone (FLOVENT HFA) 44 MCG/ACT inhaler Inhale 1 puff into the lungs 2 (two) times daily. (Patient not taking: Reported on 01/02/2019) 1 Inhaler 5    Objective: BP (!) 163/94   Pulse 78   Temp 97.9 F (36.6 C)   Resp 14   SpO2 96%    Exam:  GEN:     alert, comfortable appearing male lying supine in bed   HENT:  mucus  membranes dry, oropharyngeal without lesions or erythema,  nares patent, clear nasal discharge present  EYES:   pupils equal and reactive NECK:  supple, no lymphadenopathy  RESP:  clear to auscultation bilaterally, no increased work of breathing  CVS:   regular rate and rhythm, no murmur, distal pulses intact  ABD:  soft, nondistended, tenderness in the right upper and left upper quadrants and epigastric tenderness, no rigidity, right lower quadrant linear surgical scar, lower midline surgical scar EXT:   atraumatic, no edema NEURO:  normal without focal findings,  speech normal, alert and oriented  Skin:   warm and dry, normal skin turgor    Labs and Imaging: CBC  BMET  Recent Labs  Lab 04/17/19 0935  WBC 13.0*  HGB 15.8  HCT 48.7  PLT 137*   Recent Labs  Lab 04/17/19 0935  NA 139  K 4.1  CL 108  CO2 21*  BUN 15  CREATININE 1.59*  GLUCOSE 160*  CALCIUM 9.8     EKG: NSR HR 74; new T wave inversions in V5, V6, QTc 456    Lyndee Hensen, MD 04/17/2019, 3:43 PM PGY-1, [place] Intern pager: [phone], text pages welcome  FPTS Upper-Level Resident Addendum   I have independently interviewed and examined the patient. I have discussed the above with the original author and agree with their documentation. My edits for correction/addition/clarification are in blue. Please see also any attending notes.    Kathrene Alu, MD PGY-3, [facility] 04/17/2019 7:04 PM  [place] Service pager: [phone] (text pages welcome through [facility])

## 2019-04-17 NOTE — ED Triage Notes (Signed)
Pt to ER for evaluation of generalized abdominal pain onset this morning at 3 AM. Reports nausea and vomiting. Last BM yesterday, reports was normal. Reports kidney transplant in 2018.

## 2019-04-17 NOTE — Consult Note (Signed)
[facility]  HISTORY AND PHYSICAL  Jon [name] is an 63 y.o. male.    Chief Complaint: abd pain  HPI: Pt is a 60M with a PMH of ESRD s/p renal transplant 07/15/2017.  Baseline Cr 1.5-1.6.  Doing well from that perspective.  On prednisone 5 mg daily, cellcept 1000 mg BID, prograf 4/4.  Came to the ED today for evaluation of abdominal pain.  Found to have SBO.  Surgery has seen, NGT in place, small bowel protocol started.  Pt notes he's had an SBO before and had to have surgery.    We are asked to see him for management of immunosuppression while he's NPO.    Pt reports abd pain and is not very talkative at present.  He denies f/c, n/v, SOB, CP, LE edema.    PMH: Past Medical History:  Diagnosis Date  . Chronic kidney disease   . Dialysis patient ([place])   . GERD (gastroesophageal reflux disease)   . History of blood transfusion   . Hyperlipidemia    takes Atorvastatin daily  . Hypertension    takes Amlodipine,Losartan,and Labetalol daily  . Myocardial infarction [facility]    lived in [place] maybe 7 years ago  . Small bowel obstruction (HCC)    PSH: Past Surgical History:  Procedure Laterality Date  . ARTERIOVENOUS GRAFT PLACEMENT    . AV FISTULA PLACEMENT Right 10/25/2015   Procedure: ARTERIOVENOUS (AV) FISTULA CREATION RIGHT FOREARM;  Surgeon: Angelia Mould, MD;  Location: [place];  Service: Vascular;  Laterality: Right;  . CARDIAC CATHETERIZATION N/A 05/05/2015   Procedure: Left Heart Cath and Coronary Angiography;  Surgeon: Charolette Forward, MD;  Location: [place] [facility];  Service: Cardiovascular;  Laterality: N/A;  . LIGATION OF ARTERIOVENOUS  FISTULA Left 01/06/2019   Procedure: EXCISION OF ANEURYSMAL  ARTERIOVENOUS  FISTULA LEFT ARM;  Surgeon: Angelia Mould, MD;  Location: [place];  Service: Vascular;  Laterality: Left;  . PERIPHERAL VASCULAR CATHETERIZATION N/A 08/18/2015   Procedure: Fistulagram;  Surgeon: Conrad Oakhurst, MD;  Location: [place] [facility];  Service: Cardiovascular;  Laterality: N/A;  . PERIPHERAL VASCULAR CATHETERIZATION Right 04/19/2016   Procedure: Fistulagram;  Surgeon: Conrad , MD;  Location: [place] [facility];  Service: Cardiovascular;  Laterality: Right;  . RESECTION OF ARTERIOVENOUS FISTULA ANEURYSM Left 02/05/2013   Procedure: REPAIR OF ANEURYSM OF LEFT ARM ARTERIOVENOUS FISTULA ;  Surgeon: Serafina Mitchell, MD;  Location: [place];  Service: Vascular;  Laterality: Left;  . RESECTION OF ARTERIOVENOUS FISTULA ANEURYSM Left 03/08/2015   Procedure: REPAIR OF LEFT ARTERIOVENOUS FISTULA PSEUDOANEURYSM;  Surgeon: Angelia Mould, MD;  Location: [place];  Service: Vascular;  Laterality: Left;  . REVISON OF ARTERIOVENOUS FISTULA Left 64/09/3293   Procedure: PLICATION OF A LARGE ANEURYSM LEFT UPPER ARM BRACHIO-CEPHALIC ARTERIOVENOUS FISTULA ;  Surgeon: Angelia Mould, MD;  Location: [place];  Service: Vascular;  Laterality: Left;  . SMALL INTESTINE SURGERY       Past Medical History:  Diagnosis Date  . Chronic kidney disease   . Dialysis patient ([place])   . GERD (gastroesophageal reflux disease)   . History of blood transfusion   . Hyperlipidemia    takes Atorvastatin daily  . Hypertension    takes Amlodipine,Losartan,and Labetalol daily  . Myocardial infarction [facility])    lived in [place] maybe 7 years ago  . Small bowel obstruction (HCC)     Medications:   Scheduled: . dexamethasone (DECADRON) injection  1.52 mg  Intravenous Daily  . enoxaparin (LOVENOX) injection  40 mg Subcutaneous Q24H  . pantoprazole (PROTONIX) IV  40 mg Intravenous Q12H  . tacrolimus  2 mg Sublingual BID    Medications Prior to Admission  Medication Sig Dispense Refill  . albuterol (PROVENTIL HFA;VENTOLIN HFA) 108 (90 Base) MCG/ACT inhaler Inhale 2 puffs into the lungs every 6 (six) hours as needed for wheezing or shortness of breath. 1 Inhaler 2  . amLODipine (NORVASC) 10 MG tablet Take 10 mg by mouth at bedtime.    Marland Kitchen aspirin EC 81  MG EC tablet Take 1 tablet (81 mg total) by mouth daily. 30 tablet 3  . atorvastatin (LIPITOR) 20 MG tablet Take 1 tablet (20 mg total) by mouth daily at 6 PM. 30 tablet 3  . carvedilol (COREG) 25 MG tablet Take 1 tablet (25 mg total) by mouth 2 (two) times daily. 60 tablet 0  . hydrALAZINE (APRESOLINE) 50 MG tablet Take 1 tablet (50 mg total) by mouth 3 (three) times daily.    . magnesium oxide (MAG-OX) 400 MG tablet Take 800 mg by mouth 2 (two) times a day. Take 2 hrs before or after Cellcept    . mycophenolate (CELLCEPT) 200 MG/ML suspension Take 5 mLs by mouth 2 (two) times a day.    . oxyCODONE-acetaminophen (PERCOCET) 5-325 MG tablet Take 1 tablet by mouth every 6 (six) hours as needed for severe pain. 20 tablet 0  . PHOS-NAK 280-160-250 MG PACK Take 2 Packages by mouth 3 (three) times daily.    . predniSONE 5 MG/5ML solution Take 5 mLs by mouth daily.    . Sodium Bicarbonate POWD Take 5 mLs by mouth 2 (two) times a day. Baking soda    . sulfamethoxazole-trimethoprim (BACTRIM) 200-40 MG/5ML suspension Take 10 mLs by mouth every Monday, Wednesday, and Friday.    . tacrolimus (PROGRAF) 1 MG capsule Take 4 mg by mouth 2 (two) times daily.    . fluticasone (FLOVENT HFA) 44 MCG/ACT inhaler Inhale 1 puff into the lungs 2 (two) times daily. (Patient not taking: Reported on 01/02/2019) 1 Inhaler 5    ALLERGIES:  No Known Allergies  FAM HX: Family History  Problem Relation Age of Onset  . Hypertension Mother   . Other Mother        varicose veins  . Diabetes Father   . Hypertension Father   . Hypertension Sister   . Other Sister        varicose veins  . Other Brother        varicose veins    Social History:   reports that he has never smoked. He has never used smokeless tobacco. He reports that he does not drink alcohol or use drugs.  ROS: ROS: all other systems reviewed and are negative  Blood pressure (!) 147/110, pulse 89, temperature 98.2 F (36.8 C), temperature source Oral,  resp. rate 16, SpO2 95 %. PHYSICAL EXAM: Physical Exam  GEN appears uncomfortable, lyign in bed HEENT NGT in place with bilious fluid NECK no JVD PULM clear bilaterally CV RRR loud S2 ABD soft, no bowel sounds, mildly TTP EXT no LE edema NEURO AAO x 3 SKIN no rashes MSK   Results for orders placed or performed during the hospital encounter of 04/17/19 (from the past 48 hour(s))  Lipase, blood     Status: None   Collection Time: 04/17/19  9:35 AM  Result Value Ref Range   Lipase 24 11 - 51 U/L    Comment:  Performed at [facility], [place] [address], [place], [place] [address]  Comprehensive metabolic panel     Status: Abnormal   Collection Time: 04/17/19  9:35 AM  Result Value Ref Range   Sodium 139 135 - 145 mmol/L   Potassium 4.1 3.5 - 5.1 mmol/L    Comment: SLIGHT HEMOLYSIS   Chloride 108 98 - 111 mmol/L   CO2 21 (L) 22 - 32 mmol/L   Glucose, Bld 160 (H) 70 - 99 mg/dL   BUN 15 6 - 20 mg/dL   Creatinine, Ser 1.59 (H) 0.61 - 1.24 mg/dL   Calcium 9.8 8.9 - 10.3 mg/dL   Total Protein 7.6 6.5 - 8.1 g/dL   Albumin 4.5 3.5 - 5.0 g/dL   AST 28 15 - 41 U/L   ALT 29 0 - 44 U/L   Alkaline Phosphatase 85 38 - 126 U/L   Total Bilirubin 1.0 0.3 - 1.2 mg/dL   GFR calc non Af Amer 46 (L) >60 mL/min   GFR calc Af Amer 54 (L) >60 mL/min   Anion gap 10 5 - 15    Comment: Performed at [facility] [address], [place], [place] [address]  CBC     Status: Abnormal   Collection Time: 04/17/19  9:35 AM  Result Value Ref Range   WBC 13.0 (H) 4.0 - 10.5 K/uL   RBC 5.62 4.22 - 5.81 MIL/uL   Hemoglobin 15.8 13.0 - 17.0 g/dL   HCT 48.7 39.0 - 52.0 %   MCV 86.7 80.0 - 100.0 fL   MCH 28.1 26.0 - 34.0 pg   MCHC 32.4 30.0 - 36.0 g/dL   RDW 13.8 11.5 - 15.5 %   Platelets 137 (L) 150 - 400 K/uL   nRBC 0.0 0.0 - 0.2 %    Comment: Performed at [facility], [place] [address], [place], [place] [address]  SARS Coronavirus 2 [facility] order, Performed in [facility] hospital lab)  Nasopharyngeal     Status: None   Collection Time: 04/17/19 12:09 PM   Specimen: Nasopharyngeal  Result Value Ref Range   SARS Coronavirus 2 NEGATIVE NEGATIVE    Comment: (NOTE) If result is NEGATIVE SARS-CoV-2 target nucleic acids are NOT DETECTED. The SARS-CoV-2 RNA is generally detectable in upper and lower  respiratory specimens during the acute phase of infection. The lowest  concentration of SARS-CoV-2 viral copies this assay can detect is 250  copies / mL. A negative result does not preclude SARS-CoV-2 infection  and should not be used as the sole basis for treatment or other  patient management decisions.  A negative result may occur with  improper specimen collection / handling, submission of specimen other  than nasopharyngeal swab, presence of viral mutation(s) within the  areas targeted by this assay, and inadequate number of viral copies  (<250 copies / mL). A negative result must be combined with clinical  observations, patient history, and epidemiological information. If result is POSITIVE SARS-CoV-2 target nucleic acids are DETECTED. The SARS-CoV-2 RNA is generally detectable in upper and lower  respiratory specimens dur ing the acute phase of infection.  Positive  results are indicative of active infection with SARS-CoV-2.  Clinical  correlation with patient history and other diagnostic information is  necessary to determine patient infection status.  Positive results do  not rule out bacterial infection or co-infection with other viruses. If result is PRESUMPTIVE POSTIVE SARS-CoV-2 nucleic acids MAY BE PRESENT.   A presumptive positive  result was obtained on the submitted specimen  and confirmed on repeat testing.  While 2019 novel coronavirus  (SARS-CoV-2) nucleic acids may be present in the submitted sample  additional confirmatory testing may be necessary for epidemiological  and / or clinical management purposes  to differentiate between  SARS-CoV-2 and other  Sarbecovirus currently known to infect humans.  If clinically indicated additional testing with an alternate test  methodology [phone]) is advised. The SARS-CoV-2 RNA is generally  detectable in upper and lower respiratory sp ecimens during the acute  phase of infection. The expected result is Negative. Fact Sheet for Patients:  StrictlyIdeas.no Fact Sheet for Healthcare Providers: BankingDealers.co.za This test is not yet approved or cleared by the [place] FDA and has been authorized for detection and/or diagnosis of SARS-CoV-2 by FDA under an Emergency Use Authorization (EUA).  This EUA will remain in effect (meaning this test can be used) for the duration of the COVID-19 declaration under Section 564(b)(1) of the Act, 21 U.S.C. section 360bbb-3(b)(1), unless the authorization is terminated or revoked sooner. Performed at [facility], [place] [address], [place], [place] [address]   Urinalysis, Routine w reflex microscopic     Status: Abnormal   Collection Time: 04/17/19  1:30 PM  Result Value Ref Range   Color, Urine YELLOW YELLOW   APPearance CLOUDY (A) CLEAR   Specific Gravity, Urine 1.014 1.005 - 1.030   pH 8.0 5.0 - 8.0   Glucose, UA NEGATIVE NEGATIVE mg/dL   Hgb urine dipstick NEGATIVE NEGATIVE   Bilirubin Urine NEGATIVE NEGATIVE   Ketones, ur NEGATIVE NEGATIVE mg/dL   Protein, ur 30 (A) NEGATIVE mg/dL   Nitrite NEGATIVE NEGATIVE   Leukocytes,Ua NEGATIVE NEGATIVE   RBC / HPF 0-5 0 - 5 RBC/hpf   WBC, UA 0-5 0 - 5 WBC/hpf   Bacteria, UA RARE (A) NONE SEEN   Amorphous Crystal PRESENT     Comment: Performed at [facility] [address] [place], [place] [address]  Magnesium     Status: Abnormal   Collection Time: 04/17/19  2:20 PM  Result Value Ref Range   Magnesium 1.6 (L) 1.7 - 2.4 mg/dL    Comment: Performed at [facility] [address], [place], [place] [address]    Ct Abdomen Pelvis Wo  Contrast  Result Date: 04/17/2019 CLINICAL DATA:  Diffuse abdominal pain beginning this morning with nausea and vomiting. Previous renal transplant 2018. EXAM: CT ABDOMEN AND PELVIS WITHOUT CONTRAST TECHNIQUE: Multidetector CT imaging of the abdomen and pelvis was performed following the standard protocol without IV contrast. COMPARISON:  Abdominopelvic CT 03/31/2012 and chest CT 05/02/2015 FINDINGS: Lower chest: Lung bases demonstrate no significant consolidation or effusion. There is a 6-7 mm nodule over the medial right base. There is a 64mm and 5 mm nodule present over the right middle lobe. These are not well seen on the prior exam. Borderline cardiomegaly. Calcified plaque over the left main, left anterior descending and right coronary arteries. Mild calcified plaque over the descending thoracic aorta. Hepatobiliary: Liver, gallbladder and biliary tree are normal. Pancreas: Normal. Spleen: Normal. Adrenals/Urinary Tract: Adrenal glands are normal. Atrophic native kidneys with a few calcifications. Transplant kidney in the right lower quadrant without hydronephrosis or nephrolithiasis. Visualized right ureter is normal. 6 mm stone over the left posterior dependent portion of the bladder. Stomach/Bowel: Stomach is normal. Surgical suture line over a loop of small bowel over the mid to lower abdomen just left of midline. There  are several mildly dilated fluid-filled small bowel loops in the left mid to upper abdomen measuring up to 4.2 cm in diameter. There are 2 adjacent abruption narrowed small bowel loops in the left mid abdomen as these findings may be seen with a closed loop obstruction. Appendix not visualized. Diverticulosis of the colon. Air and stool present throughout the colon. Surgical suture line over the right colon. Vascular/Lymphatic: Mild calcified plaque over the abdominal aorta and iliac arteries. No significant adenopathy. Reproductive: Normal. Other: No significant free fluid.  Musculoskeletal: No acute findings. IMPRESSION: 1. Fluid-filled dilated small bowel loops in the left mid to upper abdomen. Two adjacent abruptly narrowed small bowel loops left of midline in the mid abdomen as findings may be due to closed loop obstruction from adhesions. 2. Colonic diverticulosis. Postsurgical changes of the large and small bowel. 3. Several small nodules over the right middle and lower lobe with the largest measuring 6 mm. Recommend further evaluation with noncontrast chest CT. 4. Aortic Atherosclerosis (ICD10-I70.0). Atherosclerotic coronary artery disease. 5. Transplant kidney right lower quadrant. Atrophic native kidneys. 6.  6-7 mm bladder stone. Electronically Signed   By: Marin Olp M.D.   On: 04/17/2019 11:41   Dg Abdomen 1 View  Result Date: 04/17/2019 CLINICAL DATA:  Nasogastric tube placement EXAM: ABDOMEN - 1 VIEW COMPARISON:  CT 04/17/2019 FINDINGS: Nasogastric tube courses below the diaphragm with distal tip and side hole terminating in the gastric body. Moderate stool projects throughout the colon. No gross free intraperitoneal air. No gas-filled loops of small bowel. IMPRESSION: NG tube appropriately positioned within the stomach. Electronically Signed   By: Davina Poke M.D.   On: 04/17/2019 13:39    Assessment/Plan  1.  ESRD s/p DDKT: 07/15/2017.  Will convert PO to IV.  Will do dex 1.5 mg IV (pred 10mg , not 5 mg, a little more in setting of physiologic stress), mycophenolate 1000 mg IV q 12, and tac 2 mg sublingual--> dose adjustment for SL administration.  2.  SBO: NPO, NGT in, surg following.  On IVFs.  3.  HTN: monitor, holding antihypertensives at this time  4.  Dispo: inpt  Madelon Lips 04/17/2019, 11:09 PM

## 2019-04-17 NOTE — ED Provider Notes (Signed)
[facility] EMERGENCY DEPARTMENT Provider Note   CSN: [id number] Arrival date & time: 04/17/19  0920     History   Chief Complaint Chief Complaint  Patient presents with   Abdominal Pain    HPI Jon [name] is a 63 y.o. male.     HPI   Patient is a 63 year old male with a history of CKD s/p renal transplant (06/2017), hyperlipidemia, hypertension, MI, SBO, who presents to the emergency department today for evaluation of abdominal pain.  Patient states abdominal pain is diffuse but worse to the upper abdomen.  Symptoms started abruptly around 3 AM.  Have been constant since onset.  States he has not been passing flatus.  States he has had multiple episodes of nausea/vomiting.  States he has had some hematemesis.  Denies any diarrhea or bloody stools.  Has had no fevers, chest pain, shortness of breath or other associated symptoms.  Reviewed prior records per care everywhere. Baseline Cr ~1.5.  Past Medical History:  Diagnosis Date   Chronic kidney disease    Dialysis patient ([place])    GERD (gastroesophageal reflux disease)    History of blood transfusion    Hyperlipidemia    takes Atorvastatin daily   Hypertension    takes Amlodipine,Losartan,and Labetalol daily   Myocardial infarction [facility])    lived in [place] maybe 7 years ago   Small bowel obstruction [facility])     Patient Active Problem List   Diagnosis Date Noted   Kidney transplant recipient 08/26/2017   Cough 01/24/2017   Anemia due to chronic kidney disease    Thrombocytopenia ([place])    Encounter to establish care 10/16/2016   Right ankle pain 09/07/2016   HTN (hypertension) 05/07/2012    Past Surgical History:  Procedure Laterality Date   ARTERIOVENOUS GRAFT PLACEMENT     AV FISTULA PLACEMENT Right 10/25/2015   Procedure: ARTERIOVENOUS (AV) FISTULA CREATION RIGHT FOREARM;  Surgeon: Angelia Mould, MD;  Location: [place];  Service: Vascular;  Laterality: Right;    CARDIAC CATHETERIZATION N/A 05/05/2015   Procedure: Left Heart Cath and Coronary Angiography;  Surgeon: Charolette Forward, MD;  Location: [place] CV LAB;  Service: Cardiovascular;  Laterality: N/A;   LIGATION OF ARTERIOVENOUS  FISTULA Left 01/06/2019   Procedure: EXCISION OF ANEURYSMAL  ARTERIOVENOUS  FISTULA LEFT ARM;  Surgeon: Angelia Mould, MD;  Location: [place];  Service: Vascular;  Laterality: Left;   PERIPHERAL VASCULAR CATHETERIZATION N/A 08/18/2015   Procedure: Fistulagram;  Surgeon: Conrad [place], MD;  Location: [place] CV LAB;  Service: Cardiovascular;  Laterality: N/A;   PERIPHERAL VASCULAR CATHETERIZATION Right 04/19/2016   Procedure: Fistulagram;  Surgeon: Conrad Kailua, MD;  Location: [place] CV LAB;  Service: Cardiovascular;  Laterality: Right;   RESECTION OF ARTERIOVENOUS FISTULA ANEURYSM Left 02/05/2013   Procedure: REPAIR OF ANEURYSM OF LEFT ARM ARTERIOVENOUS FISTULA ;  Surgeon: Serafina Mitchell, MD;  Location: [place];  Service: Vascular;  Laterality: Left;   RESECTION OF ARTERIOVENOUS FISTULA ANEURYSM Left 03/08/2015   Procedure: REPAIR OF LEFT ARTERIOVENOUS FISTULA PSEUDOANEURYSM;  Surgeon: Angelia Mould, MD;  Location: [place];  Service: Vascular;  Laterality: Left;   REVISON OF ARTERIOVENOUS FISTULA Left 48/08/5001   Procedure: PLICATION OF A LARGE ANEURYSM LEFT UPPER ARM BRACHIO-CEPHALIC ARTERIOVENOUS FISTULA ;  Surgeon: Angelia Mould, MD;  Location: [place];  Service: Vascular;  Laterality: Left;   SMALL INTESTINE SURGERY          Home Medications  Prior to Admission medications   Medication Sig Start Date End Date Taking? Authorizing Provider  albuterol (PROVENTIL HFA;VENTOLIN HFA) 108 (90 Base) MCG/ACT inhaler Inhale 2 puffs into the lungs every 6 (six) hours as needed for wheezing or shortness of breath. 01/24/17   Shela Leff, MD  amLODipine (NORVASC) 10 MG tablet Take 10 mg by mouth at bedtime.    [provider]  aspirin  EC 81 MG EC tablet Take 1 tablet (81 mg total) by mouth daily. 05/30/13   Charolette Forward, MD  atorvastatin (LIPITOR) 20 MG tablet Take 1 tablet (20 mg total) by mouth daily at 6 PM. 05/30/13   Charolette Forward, MD  carvedilol (COREG) 25 MG tablet Take 1 tablet (25 mg total) by mouth 2 (two) times daily. 01/29/17 01/02/19  Shela Leff, MD  fluticasone (FLOVENT HFA) 44 MCG/ACT inhaler Inhale 1 puff into the lungs 2 (two) times daily. Patient not taking: Reported on 01/02/2019 03/08/17   Chesley Mires, MD  hydrALAZINE (APRESOLINE) 50 MG tablet Take 1 tablet (50 mg total) by mouth 3 (three) times daily. 01/06/19   Rhyne, Hulen Shouts, PA-C  magnesium oxide (MAG-OX) 400 MG tablet Take 800 mg by mouth 2 (two) times a day.    [provider]  mycophenolate (CELLCEPT) 200 MG/ML suspension Take 5 mLs by mouth 2 (two) times a day. 03/20/18   [provider]  oxyCODONE-acetaminophen (PERCOCET) 5-325 MG tablet Take 1 tablet by mouth every 6 (six) hours as needed for severe pain. 01/06/19   Rhyne, Samantha J, PA-C  PHOS-NAK 280-160-250 MG PACK Take 2 Packages by mouth 3 (three) times daily. 12/15/18   [provider]  predniSONE 5 MG/5ML solution Take 5 mLs by mouth daily. 11/18/18   [provider]  Sodium Bicarbonate POWD Take 5 mLs by mouth 2 (two) times a day. Baking soda    [provider]  sulfamethoxazole-trimethoprim (BACTRIM) 200-40 MG/5ML suspension Take 10 mLs by mouth every Monday, Wednesday, and Friday. 12/01/18   [provider]  tacrolimus (PROGRAF) 1 MG capsule Take 4 mg by mouth 2 (two) times daily.    [provider]    Family History Family History  Problem Relation Age of Onset   Hypertension Mother    Other Mother        varicose veins   Diabetes Father    Hypertension Father    Hypertension Sister    Other Sister        varicose veins   Other Brother        varicose veins    Social History Social History   Tobacco Use     Smoking status: Never Smoker   Smokeless tobacco: Never Used  Substance Use Topics   Alcohol use: No    Alcohol/week: 0.0 standard drinks   Drug use: No     Allergies   Patient has no known allergies.   Review of Systems Review of Systems  Constitutional: Negative for chills and fever.  HENT: Negative for ear pain and sore throat.   Eyes: Negative for visual disturbance.  Respiratory: Negative for cough and shortness of breath.   Cardiovascular: Negative for chest pain.  Gastrointestinal: Positive for abdominal pain, nausea and vomiting. Negative for constipation and diarrhea.  Genitourinary: Negative for dysuria.  Musculoskeletal: Negative for back pain.  Skin: Negative for rash.  Neurological: Negative for headaches.  All other systems reviewed and are negative.    Physical Exam Updated Vital Signs BP (!) 141/88 (BP Location:  Right Arm)    Pulse 74    Temp 97.9 F (36.6 C)    Resp 18    SpO2 100%   Physical Exam Vitals signs and nursing note reviewed.  Constitutional:      General: He is in acute distress.     Appearance: He is well-developed. He is not diaphoretic.  HENT:     Head: Normocephalic and atraumatic.  Eyes:     Conjunctiva/sclera: Conjunctivae normal.  Neck:     Musculoskeletal: Neck supple.  Cardiovascular:     Rate and Rhythm: Normal rate and regular rhythm.     Heart sounds: Normal heart sounds. No murmur.  Pulmonary:     Effort: Pulmonary effort is normal. No respiratory distress.     Breath sounds: Normal breath sounds. No wheezing, rhonchi or rales.  Abdominal:     General: Abdomen is protuberant. Bowel sounds are decreased. There is distension.     Palpations: Abdomen is soft.     Tenderness: There is generalized abdominal tenderness (worse to upper abd). There is rebound.  Skin:    General: Skin is warm and dry.  Neurological:     Mental Status: He is alert.     ED Treatments / Results  Labs (all labs ordered are listed, but  only abnormal results are displayed) Labs Reviewed  COMPREHENSIVE METABOLIC PANEL - Abnormal; Notable for the following components:      Result Value   CO2 21 (*)    Glucose, Bld 160 (*)    Creatinine, Ser 1.59 (*)    GFR calc non Af Amer 46 (*)    GFR calc Af Amer 54 (*)    All other components within normal limits  CBC - Abnormal; Notable for the following components:   WBC 13.0 (*)    Platelets 137 (*)    All other components within normal limits  SARS CORONAVIRUS 2 (HOSPITAL ORDER, [place] [facility])  LIPASE, BLOOD  URINALYSIS, ROUTINE W REFLEX MICROSCOPIC  MAGNESIUM    EKG None  Radiology Ct Abdomen Pelvis Wo Contrast  Result Date: 04/17/2019 CLINICAL DATA:  Diffuse abdominal pain beginning this morning with nausea and vomiting. Previous renal transplant 2018. EXAM: CT ABDOMEN AND PELVIS WITHOUT CONTRAST TECHNIQUE: Multidetector CT imaging of the abdomen and pelvis was performed following the standard protocol without IV contrast. COMPARISON:  Abdominopelvic CT 03/31/2012 and chest CT 05/02/2015 FINDINGS: Lower chest: Lung bases demonstrate no significant consolidation or effusion. There is a 6-7 mm nodule over the medial right base. There is a 19mm and 5 mm nodule present over the right middle lobe. These are not well seen on the prior exam. Borderline cardiomegaly. Calcified plaque over the left main, left anterior descending and right coronary arteries. Mild calcified plaque over the descending thoracic aorta. Hepatobiliary: Liver, gallbladder and biliary tree are normal. Pancreas: Normal. Spleen: Normal. Adrenals/Urinary Tract: Adrenal glands are normal. Atrophic native kidneys with a few calcifications. Transplant kidney in the right lower quadrant without hydronephrosis or nephrolithiasis. Visualized right ureter is normal. 6 mm stone over the left posterior dependent portion of the bladder. Stomach/Bowel: Stomach is normal. Surgical suture line over a loop of  small bowel over the mid to lower abdomen just left of midline. There are several mildly dilated fluid-filled small bowel loops in the left mid to upper abdomen measuring up to 4.2 cm in diameter. There are 2 adjacent abruption narrowed small bowel loops in the left mid abdomen as these findings may be  seen with a closed loop obstruction. Appendix not visualized. Diverticulosis of the colon. Air and stool present throughout the colon. Surgical suture line over the right colon. Vascular/Lymphatic: Mild calcified plaque over the abdominal aorta and iliac arteries. No significant adenopathy. Reproductive: Normal. Other: No significant free fluid. Musculoskeletal: No acute findings. IMPRESSION: 1. Fluid-filled dilated small bowel loops in the left mid to upper abdomen. Two adjacent abruptly narrowed small bowel loops left of midline in the mid abdomen as findings may be due to closed loop obstruction from adhesions. 2. Colonic diverticulosis. Postsurgical changes of the large and small bowel. 3. Several small nodules over the right middle and lower lobe with the largest measuring 6 mm. Recommend further evaluation with noncontrast chest CT. 4. Aortic Atherosclerosis (ICD10-I70.0). Atherosclerotic coronary artery disease. 5. Transplant kidney right lower quadrant. Atrophic native kidneys. 6.  6-7 mm bladder stone. Electronically Signed   By: Marin Olp M.D.   On: 04/17/2019 11:41    Procedures Procedures (including critical care time)  Medications Ordered in ED Medications  HYDROmorphone (DILAUDID) injection 1 mg (1 mg Intravenous Given 04/17/19 1227)  0.9 %  sodium chloride infusion ( Intravenous Hold 04/17/19 1129)  HYDROmorphone (DILAUDID) injection 0.5 mg (0.5 mg Intravenous Given 04/17/19 1010)  ondansetron (ZOFRAN) injection 4 mg (4 mg Intravenous Given 04/17/19 1129)  pantoprazole (PROTONIX) injection 40 mg (40 mg Intravenous Given 04/17/19 1129)  LORazepam (ATIVAN) injection 1 mg (1 mg Intravenous  Given 04/17/19 1228)     Initial Impression / Assessment and Plan / ED Course  I have reviewed the triage vital signs and the nursing notes.  Pertinent labs & imaging results that were available during my care of the patient were reviewed by me and considered in my medical decision making (see chart for details).    Final Clinical Impressions(s) / ED Diagnoses   Final diagnoses:  Small bowel obstruction ([place])   63 year old male with history of renal transplant and SBO presenting for evaluation of diffuse abdominal pain that started around 3 AM.  Is associated with nausea/vomiting and decreased flatus.  On exam, patient afebrile, slightly hypertensive but otherwise vital signs are reassuring.  He has diffuse tenderness to the abdomen, worse to the upper abdomen.  Decreased bowel sounds throughout.    Will obtain labs and CT abd/pelvis CBC with mild leukocytosis, no anemia CMP with creatinine of 1.59 which is baseline for patient.  Normal LFTs and electrolytes. Lipase is negative UA pending at the time of admission COVID testing pending at time of admission.  CT abd/pelvis with fluid-filled dilated small bowel loops in the left mid to upper abdomen. Two adjacent abruptly narrowed small bowel loops left of midline in the mid abdomen as findings may be due to closed loop obstruction from adhesions.  12:06 PM CONSULT with Rubie Maid, [facility] with general surgery who will see the patient. Recommends medical admission.   12:18 CONSULT with Dr. Shan Levans with family medicine residency who accepts patient for admission.  ED Discharge Orders    None       Bishop Dublin 04/17/19 1249    Wyvonnia Dusky, MD 04/17/19 [phone]

## 2019-04-18 ENCOUNTER — Inpatient Hospital Stay (HOSPITAL_COMMUNITY): Payer: Medicare Other

## 2019-04-18 DIAGNOSIS — K56609 Unspecified intestinal obstruction, unspecified as to partial versus complete obstruction: Principal | ICD-10-CM

## 2019-04-18 LAB — BASIC METABOLIC PANEL
Anion gap: 13 (ref 5–15)
BUN: 15 mg/dL (ref 6–20)
CO2: 21 mmol/L — ABNORMAL LOW (ref 22–32)
Calcium: 9.6 mg/dL (ref 8.9–10.3)
Chloride: 105 mmol/L (ref 98–111)
Creatinine, Ser: 1.57 mg/dL — ABNORMAL HIGH (ref 0.61–1.24)
GFR calc Af Amer: 55 mL/min — ABNORMAL LOW (ref >60)
GFR calc non Af Amer: 47 mL/min — ABNORMAL LOW (ref >60)
Glucose, Bld: 121 mg/dL — ABNORMAL HIGH (ref 70–99)
Potassium: 4.5 mmol/L (ref 3.5–5.1)
Sodium: 139 mmol/L (ref 135–145)

## 2019-04-18 LAB — CBC
HCT: 52.9 % — ABNORMAL HIGH (ref 39.0–52.0)
Hemoglobin: 16.4 g/dL (ref 13.0–17.0)
MCH: 27.3 pg (ref 26.0–34.0)
MCHC: 31 g/dL (ref 30.0–36.0)
MCV: 88.2 fL (ref 80.0–100.0)
Platelets: 142 10*3/uL — ABNORMAL LOW (ref 150–400)
RBC: 6 MIL/uL — ABNORMAL HIGH (ref 4.22–5.81)
RDW: 14.1 % (ref 11.5–15.5)
WBC: 12.5 10*3/uL — ABNORMAL HIGH (ref 4.0–10.5)
nRBC: 0 % (ref 0.0–0.2)

## 2019-04-18 LAB — HIV ANTIBODY (ROUTINE TESTING W REFLEX): HIV Screen 4th Generation wRfx: NONREACTIVE

## 2019-04-18 MED ORDER — DIATRIZOATE MEGLUMINE & SODIUM 66-10 % PO SOLN
90.0000 mL | Freq: Once | ORAL | Status: AC
  Administered 2019-04-18: 90 mL via NASOGASTRIC
  Filled 2019-04-18: qty 90

## 2019-04-18 MED ORDER — MAGNESIUM SULFATE 2 GM/50ML IV SOLN
2.0000 g | Freq: Once | INTRAVENOUS | Status: AC
  Administered 2019-04-18: 2 g via INTRAVENOUS
  Filled 2019-04-18: qty 50

## 2019-04-18 NOTE — Progress Notes (Signed)
[facility] Progress Note    Assessment/ Plan:   1.  ESRD s/p DDKT: 07/15/2017.  Will convert PO to IV.  Will do dex 1.5 mg IV (pred 10mg , not 5 mg, a little more in setting of physiologic stress), mycophenolate 1000 mg IV q 12, and tac 2 mg sublingual--> dose adjustment for SL administration. Will get tac level tomorrow AM  2.  SBO: NPO, NGT in, surg following.  On IVFs.  3.  HTN: monitor, holding antihypertensives at this time  4.  Dispo: inpt  Subjective:    NAD, feeling better.     Objective:   BP (!) 168/95 (BP Location: Left Arm)   Pulse 83   Temp 98.5 F (36.9 C) (Oral)   Resp 18   Wt 104 kg   SpO2 97%   BMI 31.10 kg/m   Intake/Output Summary (Last 24 hours) at 04/18/2019 1137 Last data filed at 04/18/2019 0630 Gross per 24 hour  Intake 2187 ml  Output 1950 ml  Net 237 ml   Weight change:   Physical Exam: GEN appears more comfortable, lying in bed HEENT NGT in place with bilious fluid NECK no JVD PULM clear bilaterally CV RRR loud S2 ABD soft, no bowel sounds, mildly TTP EXT no LE edema NEURO AAO x 3 SKIN no rashes  Imaging: Ct Abdomen Pelvis Wo Contrast  Result Date: 04/17/2019 CLINICAL DATA:  Diffuse abdominal pain beginning this morning with nausea and vomiting. Previous renal transplant 2018. EXAM: CT ABDOMEN AND PELVIS WITHOUT CONTRAST TECHNIQUE: Multidetector CT imaging of the abdomen and pelvis was performed following the standard protocol without IV contrast. COMPARISON:  Abdominopelvic CT 03/31/2012 and chest CT 05/02/2015 FINDINGS: Lower chest: Lung bases demonstrate no significant consolidation or effusion. There is a 6-7 mm nodule over the medial right base. There is a 48mm and 5 mm nodule present over the right middle lobe. These are not well seen on the prior exam. Borderline cardiomegaly. Calcified plaque over the left main, left anterior descending and right coronary arteries. Mild calcified plaque over the descending thoracic  aorta. Hepatobiliary: Liver, gallbladder and biliary tree are normal. Pancreas: Normal. Spleen: Normal. Adrenals/Urinary Tract: Adrenal glands are normal. Atrophic native kidneys with a few calcifications. Transplant kidney in the right lower quadrant without hydronephrosis or nephrolithiasis. Visualized right ureter is normal. 6 mm stone over the left posterior dependent portion of the bladder. Stomach/Bowel: Stomach is normal. Surgical suture line over a loop of small bowel over the mid to lower abdomen just left of midline. There are several mildly dilated fluid-filled small bowel loops in the left mid to upper abdomen measuring up to 4.2 cm in diameter. There are 2 adjacent abruption narrowed small bowel loops in the left mid abdomen as these findings may be seen with a closed loop obstruction. Appendix not visualized. Diverticulosis of the colon. Air and stool present throughout the colon. Surgical suture line over the right colon. Vascular/Lymphatic: Mild calcified plaque over the abdominal aorta and iliac arteries. No significant adenopathy. Reproductive: Normal. Other: No significant free fluid. Musculoskeletal: No acute findings. IMPRESSION: 1. Fluid-filled dilated small bowel loops in the left mid to upper abdomen. Two adjacent abruptly narrowed small bowel loops left of midline in the mid abdomen as findings may be due to closed loop obstruction from adhesions. 2. Colonic diverticulosis. Postsurgical changes of the large and small bowel. 3. Several small nodules over the right middle and lower lobe with the largest measuring 6 mm. Recommend further evaluation with noncontrast  chest CT. 4. Aortic Atherosclerosis (ICD10-I70.0). Atherosclerotic coronary artery disease. 5. Transplant kidney right lower quadrant. Atrophic native kidneys. 6.  6-7 mm bladder stone. Electronically Signed   By: Marin Olp M.D.   On: 04/17/2019 11:41   Dg Abdomen 1 View  Result Date: 04/17/2019 CLINICAL DATA:  Nasogastric  tube placement EXAM: ABDOMEN - 1 VIEW COMPARISON:  CT 04/17/2019 FINDINGS: Nasogastric tube courses below the diaphragm with distal tip and side hole terminating in the gastric body. Moderate stool projects throughout the colon. No gross free intraperitoneal air. No gas-filled loops of small bowel. IMPRESSION: NG tube appropriately positioned within the stomach. Electronically Signed   By: Davina Poke M.D.   On: 04/17/2019 13:39   Dg Abd Portable 1v  Result Date: 04/18/2019 CLINICAL DATA:  Follow-up small bowel obstruction. EXAM: PORTABLE ABDOMEN - 1 VIEW COMPARISON:  04/17/2019, abdominal radiographs and CT. FINDINGS: Partly air-filled mildly dilated loops of small bowel is noted in the left central abdomen. Air stool are noted within the normal caliber colon. NG tube stable within the stomach. IMPRESSION: 1. Single mildly dilated loop of central small bowel. No definite change compared to the previous day's CT scan. Findings reflect ileus or low grade partial obstruction. Electronically Signed   By: Lajean Manes M.D.   On: 04/18/2019 05:37    Labs: BMET Recent Labs  Lab 04/17/19 0935 04/18/19 0543  NA 139 139  K 4.1 4.5  CL 108 105  CO2 21* 21*  GLUCOSE 160* 121*  BUN 15 15  CREATININE 1.59* 1.57*  CALCIUM 9.8 9.6   CBC Recent Labs  Lab 04/17/19 0935 04/18/19 0543  WBC 13.0* 12.5*  HGB 15.8 16.4  HCT 48.7 52.9*  MCV 86.7 88.2  PLT 137* 142*    Medications:    . dexamethasone (DECADRON) injection  1.52 mg Intravenous Daily  . diatrizoate meglumine-sodium  90 mL Per NG tube Once  . enoxaparin (LOVENOX) injection  40 mg Subcutaneous Q24H  . pantoprazole (PROTONIX) IV  40 mg Intravenous Q12H  . tacrolimus  2 mg Sublingual BID      Madelon Lips, MD 04/18/2019, 11:37 AM

## 2019-04-18 NOTE — Progress Notes (Signed)
Family Medicine Teaching Service Daily Progress Note Intern Pager: [phone]  Patient name: Jon [name] Medical record number: [id number] Date of birth: 1956-04-06 Age: 63 y.o. Gender: male  Primary Care Provider: Guadalupe Dawn, MD Consultants: General Surgery Code Status: FULL Emergency contact: Covington Bing, brother, at [phone]  Pt Overview and Major Events to Date:  9/18 admit to FPTS, CTA: fluid-filled dilated small bowel loops in L mid to upper abdomen; NG tube placed 9/19: SBO protocol  Assessment and Plan: Jon [name] is a 63 y.o. male presenting with abdominal pain and was found to have an SBO. PMH is significant for CKD s/p renal transplant in 06/2017, CAD, hypertension, and hyperlipidemia.  SBO: Patient with acute onset abdominal pain, nausea, and vomiting and found to have SBO. History of SBO 10-15 years ago. Expect SBO secondary to adhesions from recent renal transplant.  General surgery consulted and NG tube placed. 1.2L overnight, unsure if via NG tube, however 693mL out this AM. Currently NPO. Pain controlled with Dialudid IV q2 prn - required 6 doses overnight. Abdominal pain moderate this AM. Patient not passing flatus or bowel movements. Denies nausea/vomiting.  No zofran or phenergan required. Per surgery consult yesterday, plan was to evaluate abdominal ultrasound this AM and consider starting SBO protocol. Abdominal x-ray unchanged from day prior - SBO protocol ordered.  - Per gen surg: SBO protocol, ambulate, NPO, NGT - NGT for decompression and bowel rest - NPO, BID IV Protonix  - Phenergan IV q6 PRN, Zofran q6 PRN - Dilaudid 1mg  IV q2 PRN   CAD with H/o MI: H/o MI in 2006 with most recent heart cath in 2016 notable for lesion in mid LAD. Denies any chest pain or SOB. Home meds: ASA 81mg , Coreg 25mg  BID. - hold home meds while NPO, plan to restart when able   HTN: BP elevated on admission, improved but still elevated this AM, 142/106. Denies any chest pain,  SOB, headaches, vision changes. Home meds include: Coreg 25mg  BID, Hydralazine 50mg  TID, and Amlodipine 10mg  QD.  - hold home meds while NPO - can consider PRN IV meds if needed while NPO  HLD: Home meds: Atorvastatin 20mg  QD - hold home meds while NPO   ESRD s/p Right Kidney Transplant:  She is status post right kidney transplant in December 2018 after being ESRD from 2006-2018. He is currently on immunosuppressants.  Nephrology consulted to convert immunosuppressants from p.o. to IV medicines.  Home medications include: Cellcept, NaBicarb powder, Tacrolimus 4mg  BID, Prednisone 5mg  QD, MagOx 800 BID, and P-Na-K supplementations. - nephrology consulted, appreciate recs - Continue Dexamethasone 1.5mg  IV (eqivalent to Pred 10mg  - inc for physiologic stress), Mycophenolate 1000mg  IV BID, and Tacrolimus 2mg  SL  Pulmonary Nodules: Several small pulmonary nodules over right middle and lower lobe with largest measuring 33mm with recommendation for further evaluation with non-contrast chest CT. Denies cough or hemoptysis.  - recommend outpatient chest CT  FEN/GI: NPO, IV Protonix BID PPx: Lovenox  Disposition: med-surg, home pending improvement  Subjective:  Patient lying in bed with nurses at bedside. Notes pain is mild this AM and is being well controlled with current pain meds. Denies any flatus or bowel movements, nausea, vomiting.  Objective: Temp:  [97.5 F (36.4 C)-98.2 F (36.8 C)] 97.5 F (36.4 C) (09/19 0429) Pulse Rate:  [74-89] 79 (09/19 0429) Resp:  [14-25] 18 (09/19 0429) BP: (140-192)/(88-115) 142/106 (09/19 0429) SpO2:  [92 %-100 %] 94 % (09/19 0429) Weight:  [104 kg] 104 kg (09/18 2103) Physical Exam:  General: appears somewhat uncomfortable but no acute distress, lying in bed with NG tube in place CV: regular rate and rhythm without murmurs, rubs, or gallops, no lower extremity edema Lungs: clear to auscultation bilaterally with normal work of breathing Abdomen: mildly  firm, tender to light palpation, mild distended, rare bowel sounds  Skin: warm, dry Extremities: warm and well perfused Neuro: Alert and oriented, speech normal   Laboratory: Recent Labs  Lab 04/17/19 0935 04/18/19 0543  WBC 13.0* 12.5*  HGB 15.8 16.4  HCT 48.7 52.9*  PLT 137* 142*   Recent Labs  Lab 04/17/19 0935 04/18/19 0543  NA 139 139  K 4.1 4.5  CL 108 105  CO2 21* 21*  BUN 15 15  CREATININE 1.59* 1.57*  CALCIUM 9.8 9.6  PROT 7.6  --   BILITOT 1.0  --   ALKPHOS 85  --   ALT 29  --   AST 28  --   GLUCOSE 160* 121*   Urinalysis    Component Value Date/Time   COLORURINE YELLOW 04/17/2019 1330   APPEARANCEUR CLOUDY (A) 04/17/2019 1330   LABSPEC 1.014 04/17/2019 1330   PHURINE 8.0 04/17/2019 1330   GLUCOSEU NEGATIVE 04/17/2019 1330   HGBUR NEGATIVE 04/17/2019 1330   BILIRUBINUR NEGATIVE 04/17/2019 1330   KETONESUR NEGATIVE 04/17/2019 1330   PROTEINUR 30 (A) 04/17/2019 1330   UROBILINOGEN 0.2 07/04/2010 0532   NITRITE NEGATIVE 04/17/2019 1330   LEUKOCYTESUR NEGATIVE 04/17/2019 1330   Lipase 24 COVID: neg Mag 1.6  Imaging/Diagnostic Tests: Ct Abdomen Pelvis Wo Contrast  Result Date: 04/17/2019 CLINICAL DATA:  Diffuse abdominal pain beginning this morning with nausea and vomiting. Previous renal transplant 2018. EXAM: CT ABDOMEN AND PELVIS WITHOUT CONTRAST TECHNIQUE: Multidetector CT imaging of the abdomen and pelvis was performed following the standard protocol without IV contrast. COMPARISON:  Abdominopelvic CT 03/31/2012 and chest CT 05/02/2015 FINDINGS: Lower chest: Lung bases demonstrate no significant consolidation or effusion. There is a 6-7 mm nodule over the medial right base. There is a 57mm and 5 mm nodule present over the right middle lobe. These are not well seen on the prior exam. Borderline cardiomegaly. Calcified plaque over the left main, left anterior descending and right coronary arteries. Mild calcified plaque over the descending thoracic  aorta. Hepatobiliary: Liver, gallbladder and biliary tree are normal. Pancreas: Normal. Spleen: Normal. Adrenals/Urinary Tract: Adrenal glands are normal. Atrophic native kidneys with a few calcifications. Transplant kidney in the right lower quadrant without hydronephrosis or nephrolithiasis. Visualized right ureter is normal. 6 mm stone over the left posterior dependent portion of the bladder. Stomach/Bowel: Stomach is normal. Surgical suture line over a loop of small bowel over the mid to lower abdomen just left of midline. There are several mildly dilated fluid-filled small bowel loops in the left mid to upper abdomen measuring up to 4.2 cm in diameter. There are 2 adjacent abruption narrowed small bowel loops in the left mid abdomen as these findings may be seen with a closed loop obstruction. Appendix not visualized. Diverticulosis of the colon. Air and stool present throughout the colon. Surgical suture line over the right colon. Vascular/Lymphatic: Mild calcified plaque over the abdominal aorta and iliac arteries. No significant adenopathy. Reproductive: Normal. Other: No significant free fluid. Musculoskeletal: No acute findings. IMPRESSION: 1. Fluid-filled dilated small bowel loops in the left mid to upper abdomen. Two adjacent abruptly narrowed small bowel loops left of midline in the mid abdomen as findings may be due to closed loop obstruction from adhesions. 2.  Colonic diverticulosis. Postsurgical changes of the large and small bowel. 3. Several small nodules over the right middle and lower lobe with the largest measuring 6 mm. Recommend further evaluation with noncontrast chest CT. 4. Aortic Atherosclerosis (ICD10-I70.0). Atherosclerotic coronary artery disease. 5. Transplant kidney right lower quadrant. Atrophic native kidneys. 6.  6-7 mm bladder stone. Electronically Signed   By: Marin Olp M.D.   On: 04/17/2019 11:41   Dg Abdomen 1 View  Result Date: 04/17/2019 CLINICAL DATA:  Nasogastric  tube placement EXAM: ABDOMEN - 1 VIEW COMPARISON:  CT 04/17/2019 FINDINGS: Nasogastric tube courses below the diaphragm with distal tip and side hole terminating in the gastric body. Moderate stool projects throughout the colon. No gross free intraperitoneal air. No gas-filled loops of small bowel. IMPRESSION: NG tube appropriately positioned within the stomach. Electronically Signed   By: Davina Poke M.D.   On: 04/17/2019 13:39   Dg Abd Portable 1v  Result Date: 04/18/2019 CLINICAL DATA:  Follow-up small bowel obstruction. EXAM: PORTABLE ABDOMEN - 1 VIEW COMPARISON:  04/17/2019, abdominal radiographs and CT. FINDINGS: Partly air-filled mildly dilated loops of small bowel is noted in the left central abdomen. Air stool are noted within the normal caliber colon. NG tube stable within the stomach. IMPRESSION: 1. Single mildly dilated loop of central small bowel. No definite change compared to the previous day's CT scan. Findings reflect ileus or low grade partial obstruction. Electronically Signed   By: Lajean Manes M.D.   On: 04/18/2019 05:37    Danna Hefty, DO 04/18/2019, 7:55 AM PGY-2, [place] Intern pager: [phone], text pages welcome

## 2019-04-18 NOTE — Progress Notes (Signed)
[facility]:  [phone] General Surgery Progress Note   LOS: 1 day  POD -     Chief Complaint: Abdominal pain  Assessment and Plan: 1.  SBO  WBC - 12,500 - 04/18/2019  KUB - single loop of SB, ileus vs SBO  He feels much better than yesterday, but no flatus.  I though that SBO was to be started today, but I don't see the orders and nurse is unaware of this.  Will order SB protocol.  He needs to ambulate also.  2.  Hx of ESRD s/p kidney transplant in 2018   (kidney sits in RLQ)  On Decadron/Prograf 3.  HTN 4.  Hx of MI 5.  GERD 6.  Pulmonary nodules seen on CT scan in right middle/lower lobe - radiology suggest follow up CT of chest 7.  DVT prophylaxis - Lovenox   Active Problems:   SBO (small bowel obstruction) (HCC)  Subjective:  Feels better.  Thirsty.  No flatus.  Objective:   Vitals:   04/18/19 0429 04/18/19 0911  BP: (!) 142/106 (!) 168/95  Pulse: 79 83  Resp: 18 18  Temp: (!) 97.5 F (36.4 C) 98.5 F (36.9 C)  SpO2: 94% 97%     Intake/Output from previous day:  09/18 0701 - 09/19 0700 In: 2187 [I.V.:1980; IV Piggyback:207] Out: 1200   Intake/Output this shift:  Total I/O In: -  Out: 750 [Urine:150; Emesis/NG output:600]   Physical Exam:   General: WN AA M who is alert and oriented.   Has NGT.   HEENT: Normal. Pupils equal. .   Lungs: Clear.   Abdomen: Soft.  Minimal left sided soreness, much better than yesterday   Lab Results:    Recent Labs    04/17/19 0935 04/18/19 0543  WBC 13.0* 12.5*  HGB 15.8 16.4  HCT 48.7 52.9*  PLT 137* 142*    BMET   Recent Labs    04/17/19 0935 04/18/19 0543  NA 139 139  K 4.1 4.5  CL 108 105  CO2 21* 21*  GLUCOSE 160* 121*  BUN 15 15  CREATININE 1.59* 1.57*  CALCIUM 9.8 9.6    PT/INR  No results for input(s): LABPROT, INR in the last 72 hours.  ABG  No results for input(s): PHART, HCO3 in the last 72 hours.  Invalid input(s): PCO2, PO2   Studies/Results:  Ct Abdomen  Pelvis Wo Contrast  Result Date: 04/17/2019 CLINICAL DATA:  Diffuse abdominal pain beginning this morning with nausea and vomiting. Previous renal transplant 2018. EXAM: CT ABDOMEN AND PELVIS WITHOUT CONTRAST TECHNIQUE: Multidetector CT imaging of the abdomen and pelvis was performed following the standard protocol without IV contrast. COMPARISON:  Abdominopelvic CT 03/31/2012 and chest CT 05/02/2015 FINDINGS: Lower chest: Lung bases demonstrate no significant consolidation or effusion. There is a 6-7 mm nodule over the medial right base. There is a 58mm and 5 mm nodule present over the right middle lobe. These are not well seen on the prior exam. Borderline cardiomegaly. Calcified plaque over the left main, left anterior descending and right coronary arteries. Mild calcified plaque over the descending thoracic aorta. Hepatobiliary: Liver, gallbladder and biliary tree are normal. Pancreas: Normal. Spleen: Normal. Adrenals/Urinary Tract: Adrenal glands are normal. Atrophic native kidneys with a few calcifications. Transplant kidney in the right lower quadrant without hydronephrosis or nephrolithiasis. Visualized right ureter is normal. 6 mm stone over the left posterior dependent portion of the bladder. Stomach/Bowel: Stomach is normal. Surgical suture line over a loop  of small bowel over the mid to lower abdomen just left of midline. There are several mildly dilated fluid-filled small bowel loops in the left mid to upper abdomen measuring up to 4.2 cm in diameter. There are 2 adjacent abruption narrowed small bowel loops in the left mid abdomen as these findings may be seen with a closed loop obstruction. Appendix not visualized. Diverticulosis of the colon. Air and stool present throughout the colon. Surgical suture line over the right colon. Vascular/Lymphatic: Mild calcified plaque over the abdominal aorta and iliac arteries. No significant adenopathy. Reproductive: Normal. Other: No significant free fluid.  Musculoskeletal: No acute findings. IMPRESSION: 1. Fluid-filled dilated small bowel loops in the left mid to upper abdomen. Two adjacent abruptly narrowed small bowel loops left of midline in the mid abdomen as findings may be due to closed loop obstruction from adhesions. 2. Colonic diverticulosis. Postsurgical changes of the large and small bowel. 3. Several small nodules over the right middle and lower lobe with the largest measuring 6 mm. Recommend further evaluation with noncontrast chest CT. 4. Aortic Atherosclerosis (ICD10-I70.0). Atherosclerotic coronary artery disease. 5. Transplant kidney right lower quadrant. Atrophic native kidneys. 6.  6-7 mm bladder stone. Electronically Signed   By: Marin Olp M.D.   On: 04/17/2019 11:41   Dg Abdomen 1 View  Result Date: 04/17/2019 CLINICAL DATA:  Nasogastric tube placement EXAM: ABDOMEN - 1 VIEW COMPARISON:  CT 04/17/2019 FINDINGS: Nasogastric tube courses below the diaphragm with distal tip and side hole terminating in the gastric body. Moderate stool projects throughout the colon. No gross free intraperitoneal air. No gas-filled loops of small bowel. IMPRESSION: NG tube appropriately positioned within the stomach. Electronically Signed   By: Davina Poke M.D.   On: 04/17/2019 13:39   Dg Abd Portable 1v  Result Date: 04/18/2019 CLINICAL DATA:  Follow-up small bowel obstruction. EXAM: PORTABLE ABDOMEN - 1 VIEW COMPARISON:  04/17/2019, abdominal radiographs and CT. FINDINGS: Partly air-filled mildly dilated loops of small bowel is noted in the left central abdomen. Air stool are noted within the normal caliber colon. NG tube stable within the stomach. IMPRESSION: 1. Single mildly dilated loop of central small bowel. No definite change compared to the previous day's CT scan. Findings reflect ileus or low grade partial obstruction. Electronically Signed   By: Lajean Manes M.D.   On: 04/18/2019 05:37     Anti-infectives:   Anti-infectives (From  admission, onward)   None      Alphonsa Overall, MD, FACS Pager: [facility] Office: [phone] 04/18/2019

## 2019-04-18 NOTE — Progress Notes (Signed)
Subjective/Chief Complaint: Pt complains of abdominal pain, but states he feels significantly better then yesterday. He states he has not has any nausea or vomiting today. He is on a NPO diet, and states he feels hungry. He states he is voiding regularly with no copmplications, but has not passed flatus or had a BM.    Objective: Vital signs in last 24 hours: Temp:  [97.5 F (36.4 C)-98.5 F (36.9 C)] 98.5 F (36.9 C) (09/19 0911) Pulse Rate:  [74-89] 83 (09/19 0911) Resp:  [14-25] 18 (09/19 0911) BP: (140-192)/(93-115) 168/95 (09/19 0911) SpO2:  [92 %-98 %] 97 % (09/19 0911) Weight:  [104 kg] 104 kg (09/18 2103) Last BM Date: 04/16/19  Intake/Output from previous day: 09/18 0701 - 09/19 0700 In: 2187 [I.V.:1980; IV Piggyback:207] Out: 1200  Intake/Output this shift: Total I/O In: -  Out: 750 [Urine:150; Emesis/NG output:600]  General: Fatigued man, cooperative, no acute distress. HEENT: NG tube in place with bilious fluid  CV: Regular rate and rhythm  Pulm: clear to auscultation  Abdominal: Abdomen is distended. No bowel sounds heard in any quadrant. Pt is TTP in all quadrants.   Lab Results:  Recent Labs    04/17/19 0935 04/18/19 0543  WBC 13.0* 12.5*  HGB 15.8 16.4  HCT 48.7 52.9*  PLT 137* 142*   BMET Recent Labs    04/17/19 0935 04/18/19 0543  NA 139 139  K 4.1 4.5  CL 108 105  CO2 21* 21*  GLUCOSE 160* 121*  BUN 15 15  CREATININE 1.59* 1.57*  CALCIUM 9.8 9.6   PT/INR No results for input(s): LABPROT, INR in the last 72 hours. ABG No results for input(s): PHART, HCO3 in the last 72 hours.  Invalid input(s): PCO2, PO2  Studies/Results: Ct Abdomen Pelvis Wo Contrast  Result Date: 04/17/2019 CLINICAL DATA:  Diffuse abdominal pain beginning this morning with nausea and vomiting. Previous renal transplant 2018. EXAM: CT ABDOMEN AND PELVIS WITHOUT CONTRAST TECHNIQUE: Multidetector CT imaging of the abdomen and pelvis was performed following the  standard protocol without IV contrast. COMPARISON:  Abdominopelvic CT 03/31/2012 and chest CT 05/02/2015 FINDINGS: Lower chest: Lung bases demonstrate no significant consolidation or effusion. There is a 6-7 mm nodule over the medial right base. There is a 68mm and 5 mm nodule present over the right middle lobe. These are not well seen on the prior exam. Borderline cardiomegaly. Calcified plaque over the left main, left anterior descending and right coronary arteries. Mild calcified plaque over the descending thoracic aorta. Hepatobiliary: Liver, gallbladder and biliary tree are normal. Pancreas: Normal. Spleen: Normal. Adrenals/Urinary Tract: Adrenal glands are normal. Atrophic native kidneys with a few calcifications. Transplant kidney in the right lower quadrant without hydronephrosis or nephrolithiasis. Visualized right ureter is normal. 6 mm stone over the left posterior dependent portion of the bladder. Stomach/Bowel: Stomach is normal. Surgical suture line over a loop of small bowel over the mid to lower abdomen just left of midline. There are several mildly dilated fluid-filled small bowel loops in the left mid to upper abdomen measuring up to 4.2 cm in diameter. There are 2 adjacent abruption narrowed small bowel loops in the left mid abdomen as these findings may be seen with a closed loop obstruction. Appendix not visualized. Diverticulosis of the colon. Air and stool present throughout the colon. Surgical suture line over the right colon. Vascular/Lymphatic: Mild calcified plaque over the abdominal aorta and iliac arteries. No significant adenopathy. Reproductive: Normal. Other: No significant free fluid. Musculoskeletal: No  acute findings. IMPRESSION: 1. Fluid-filled dilated small bowel loops in the left mid to upper abdomen. Two adjacent abruptly narrowed small bowel loops left of midline in the mid abdomen as findings may be due to closed loop obstruction from adhesions. 2. Colonic diverticulosis.  Postsurgical changes of the large and small bowel. 3. Several small nodules over the right middle and lower lobe with the largest measuring 6 mm. Recommend further evaluation with noncontrast chest CT. 4. Aortic Atherosclerosis (ICD10-I70.0). Atherosclerotic coronary artery disease. 5. Transplant kidney right lower quadrant. Atrophic native kidneys. 6.  6-7 mm bladder stone. Electronically Signed   By: Marin Olp M.D.   On: 04/17/2019 11:41   Dg Abdomen 1 View  Result Date: 04/17/2019 CLINICAL DATA:  Nasogastric tube placement EXAM: ABDOMEN - 1 VIEW COMPARISON:  CT 04/17/2019 FINDINGS: Nasogastric tube courses below the diaphragm with distal tip and side hole terminating in the gastric body. Moderate stool projects throughout the colon. No gross free intraperitoneal air. No gas-filled loops of small bowel. IMPRESSION: NG tube appropriately positioned within the stomach. Electronically Signed   By: Davina Poke M.D.   On: 04/17/2019 13:39   Dg Abd Portable 1v  Result Date: 04/18/2019 CLINICAL DATA:  Follow-up small bowel obstruction. EXAM: PORTABLE ABDOMEN - 1 VIEW COMPARISON:  04/17/2019, abdominal radiographs and CT. FINDINGS: Partly air-filled mildly dilated loops of small bowel is noted in the left central abdomen. Air stool are noted within the normal caliber colon. NG tube stable within the stomach. IMPRESSION: 1. Single mildly dilated loop of central small bowel. No definite change compared to the previous day's CT scan. Findings reflect ileus or low grade partial obstruction. Electronically Signed   By: Lajean Manes M.D.   On: 04/18/2019 05:37    Anti-infectives: Anti-infectives (From admission, onward)   None      Assessment/Plan: SBO ID: none DVT: none FEN: NPO, tolerating. IVF Follow up: TBD  Start SBO protocol and continue to monitor.   LOS: 1 day    Jon [name] Jon [name] 04/18/2019

## 2019-04-19 LAB — BASIC METABOLIC PANEL
Anion gap: 12 (ref 5–15)
BUN: 20 mg/dL (ref 6–20)
CO2: 21 mmol/L — ABNORMAL LOW (ref 22–32)
Calcium: 9.8 mg/dL (ref 8.9–10.3)
Chloride: 106 mmol/L (ref 98–111)
Creatinine, Ser: 1.84 mg/dL — ABNORMAL HIGH (ref 0.61–1.24)
GFR calc Af Amer: 45 mL/min — ABNORMAL LOW (ref >60)
GFR calc non Af Amer: 39 mL/min — ABNORMAL LOW (ref >60)
Glucose, Bld: 120 mg/dL — ABNORMAL HIGH (ref 70–99)
Potassium: 4.6 mmol/L (ref 3.5–5.1)
Sodium: 139 mmol/L (ref 135–145)

## 2019-04-19 LAB — CBC
HCT: 49.4 % (ref 39.0–52.0)
Hemoglobin: 15.6 g/dL (ref 13.0–17.0)
MCH: 27.8 pg (ref 26.0–34.0)
MCHC: 31.6 g/dL (ref 30.0–36.0)
MCV: 87.9 fL (ref 80.0–100.0)
Platelets: 134 10*3/uL — ABNORMAL LOW (ref 150–400)
RBC: 5.62 MIL/uL (ref 4.22–5.81)
RDW: 13.9 % (ref 11.5–15.5)
WBC: 11.8 10*3/uL — ABNORMAL HIGH (ref 4.0–10.5)
nRBC: 0 % (ref 0.0–0.2)

## 2019-04-19 LAB — MAGNESIUM: Magnesium: 1.6 mg/dL — ABNORMAL LOW (ref 1.7–2.4)

## 2019-04-19 MED ORDER — METOPROLOL TARTRATE 5 MG/5ML IV SOLN
2.5000 mg | Freq: Three times a day (TID) | INTRAVENOUS | Status: DC
  Administered 2019-04-19 – 2019-04-22 (×10): 2.5 mg via INTRAVENOUS
  Filled 2019-04-19 (×9): qty 5

## 2019-04-19 NOTE — Progress Notes (Signed)
[facility] Progress Note    Assessment/ Plan:   1.  ESRD s/p DDKT: 07/15/2017.  Will convert PO to IV.  Will do dex 1.5 mg IV (pred 10mg , not 5 mg, a little more in setting of physiologic stress), mycophenolate 1000 mg IV q 12, and tac 2 mg sublingual--> dose adjustment for SL administration. Tac level pending.  Baseline Cr 1.5-1.6, up to 1.8 this AM, will be sure to continue fluids.  2.  SBO: NPO, NGT in, surg following.  On IVFs.  3.  HTN: monitor, holding antihypertensives at this time, can add back when taking PO.  BP above goal right now.    4.  Dispo: inpt  Subjective:    + flatus, NGT getting clamped   Objective:   BP (!) 170/104 (BP Location: Right Arm)   Pulse 70   Temp 99.1 F (37.3 C) (Oral)   Resp 19   Wt 104 kg   SpO2 96%   BMI 31.10 kg/m   Intake/Output Summary (Last 24 hours) at 04/19/2019 1325 Last data filed at 04/19/2019 6440 Gross per 24 hour  Intake 2059.12 ml  Output 1950 ml  Net 109.12 ml   Weight change:   Physical Exam: GEN appears more comfortable, lying in bed HEENT NGT in place with bilious fluid NECK no JVD PULM clear bilaterally CV RRR loud S2 ABD soft, + hypoactive BS EXT no LE edema NEURO AAO x 3 SKIN no rashes  Imaging: Dg Abdomen 1 View  Result Date: 04/17/2019 CLINICAL DATA:  Nasogastric tube placement EXAM: ABDOMEN - 1 VIEW COMPARISON:  CT 04/17/2019 FINDINGS: Nasogastric tube courses below the diaphragm with distal tip and side hole terminating in the gastric body. Moderate stool projects throughout the colon. No gross free intraperitoneal air. No gas-filled loops of small bowel. IMPRESSION: NG tube appropriately positioned within the stomach. Electronically Signed   By: Davina Poke M.D.   On: 04/17/2019 13:39   Dg Abd Portable 1v-small Bowel Obstruction Protocol-initial, 8 Hr Delay  Result Date: 04/18/2019 CLINICAL DATA:  8 hour delay, small-bowel obstruction EXAM: PORTABLE ABDOMEN - 1 VIEW COMPARISON:   04/18/2019, CT 04/17/2019 FINDINGS: Esophageal tube tip in the left upper quadrant. Dilated small bowel in the left mid abdomen measuring up to 4.4 cm. Possible dilute contrast within the dilated bowel. No radiopaque contrast visible within the colon. IMPRESSION: 1. No radiopaque contrast visible in the colon 2. Dilated loops of left mid abdominal small bowel, felt to correspond to CT demonstrated small bowel obstruction Electronically Signed   By: Donavan Foil M.D.   On: 04/18/2019 21:35   Dg Abd Portable 1v  Result Date: 04/18/2019 CLINICAL DATA:  Follow-up small bowel obstruction. EXAM: PORTABLE ABDOMEN - 1 VIEW COMPARISON:  04/17/2019, abdominal radiographs and CT. FINDINGS: Partly air-filled mildly dilated loops of small bowel is noted in the left central abdomen. Air stool are noted within the normal caliber colon. NG tube stable within the stomach. IMPRESSION: 1. Single mildly dilated loop of central small bowel. No definite change compared to the previous day's CT scan. Findings reflect ileus or low grade partial obstruction. Electronically Signed   By: Lajean Manes M.D.   On: 04/18/2019 05:37    Labs: BMET Recent Labs  Lab 04/17/19 0935 04/18/19 0543 04/19/19 0609  NA 139 139 139  K 4.1 4.5 4.6  CL 108 105 106  CO2 21* 21* 21*  GLUCOSE 160* 121* 120*  BUN 15 15 20   CREATININE 1.59* 1.57* 1.84*  CALCIUM 9.8 9.6 9.8   CBC Recent Labs  Lab 04/17/19 0935 04/18/19 0543 04/19/19 0558  WBC 13.0* 12.5* 11.8*  HGB 15.8 16.4 15.6  HCT 48.7 52.9* 49.4  MCV 86.7 88.2 87.9  PLT 137* 142* 134*    Medications:    . dexamethasone (DECADRON) injection  1.52 mg Intravenous Daily  . enoxaparin (LOVENOX) injection  40 mg Subcutaneous Q24H  . metoprolol tartrate  2.5 mg Intravenous Q8H  . pantoprazole (PROTONIX) IV  40 mg Intravenous Q12H  . tacrolimus  2 mg Sublingual BID      Madelon Lips, MD 04/19/2019, 1:25 PM

## 2019-04-19 NOTE — Evaluation (Signed)
Physical Therapy Evaluation Patient Details Name: Jon [name] MRN: [id number] DOB: 02-01-1956 Today's Date: 04/19/2019   History of Present Illness  Jon [name] is a 63 y.o. male presenting with abdominal pain and was found to have an SBO. PMH is significant for CKD s/p renal transplant in 06/2017, CAD, hypertension, and hyperlipidemia.  Clinical Impression   Patient evaluated by Physical Therapy with no further acute PT needs identified. All education has been completed and the patient has no further questions. Walked the hallways independently; Encouraged [name] to continue to walk the hallways independently at least 2-3 times a day while here.  See below for any follow-up Physical Therapy or equipment needs. PT is signing off. Thank you for this referral.     Follow Up Recommendations No PT follow up    Equipment Recommendations  None recommended by PT    Recommendations for Other Services       Precautions / Restrictions Precautions Precaution Comments: NG Tube      Mobility  Bed Mobility Overal bed mobility: Independent                Transfers Overall transfer level: Independent                  Ambulation/Gait Ambulation/Gait assistance: Independent Gait Distance (Feet): 600 Feet(greater than) Assistive device: None;IV Pole Gait Pattern/deviations: WFL(Within Functional Limits) Gait velocity: approaching WNL   General Gait Details: No difficulty; pushed IV pole without difficulty latter part of walk  Stairs            Wheelchair Mobility    Modified Rankin (Stroke Patients Only)       Balance                                             Pertinent Vitals/Pain Pain Assessment: Faces Faces Pain Scale: Hurts a little bit Pain Location: not specified, grimace with cough Pain Descriptors / Indicators: Grimacing Pain Intervention(s): Monitored during session    Home Living Family/patient expects to be discharged  to:: Private residence Living Arrangements: Alone Available Help at Discharge: Available PRN/intermittently Type of Home: House Home Access: Stairs to enter   Technical brewer of Steps: 4 Home Layout: One level        Prior Function Level of Independence: Independent               Hand Dominance        Extremity/Trunk Assessment   Upper Extremity Assessment Upper Extremity Assessment: Overall WFL for tasks assessed    Lower Extremity Assessment Lower Extremity Assessment: Overall WFL for tasks assessed       Communication   Communication: No difficulties  Cognition Arousal/Alertness: Awake/alert Behavior During Therapy: WFL for tasks assessed/performed Overall Cognitive Status: Within Functional Limits for tasks assessed                                        General Comments      Exercises     Assessment/Plan    PT Assessment Patent does not need any further PT services  PT Problem List         PT Treatment Interventions      PT Goals (Current goals can be found in the Care Plan section)  Acute Rehab PT  Goals Patient Stated Goal: agreeable to amb PT Goal Formulation: All assessment and education complete, DC therapy    Frequency     Barriers to discharge        Co-evaluation               AM-PAC PT "6 Clicks" Mobility  Outcome Measure Help needed turning from your back to your side while in a flat bed without using bedrails?: None Help needed moving from lying on your back to sitting on the side of a flat bed without using bedrails?: None Help needed moving to and from a bed to a chair (including a wheelchair)?: None Help needed standing up from a chair using your arms (e.g., wheelchair or bedside chair)?: None Help needed to walk in hospital room?: None Help needed climbing 3-5 steps with a railing? : None 6 Click Score: 24    End of Session   Activity Tolerance: Patient tolerated treatment well Patient  left: in bed;with call bell/phone within reach Nurse Communication: Mobility status PT Visit Diagnosis: Other abnormalities of gait and mobility (R26.89)    Time: 1257-1311 PT Time Calculation (min) (ACUTE ONLY): 14 min   Charges:   PT Evaluation $PT Eval Low Complexity: 1 Low          Roney Marion, [place] Pager [phone] Office [place] 04/19/2019, 1:53 PM

## 2019-04-19 NOTE — Progress Notes (Signed)
Family Medicine Teaching Service Daily Progress Note Intern Pager: [phone]  Patient name: Jon [name] Medical record number: [id number] Date of birth: Apr 06, 1956 Age: 63 y.o. Gender: male  Primary Care Provider: Guadalupe Dawn, MD Consultants: General Surgery Code Status: FULL Emergency contact: Chetek Bing, brother, at [phone]  Pt Overview and Major Events to Date:  9/18 admit to FPTS, CTA: fluid-filled dilated small bowel loops in L mid to upper abdomen; NG tube placed 9/19: SBO protocol  Assessment and Plan: Jon [name] is a 63 y.o. male presented with abdominal pain and was found to have an SBO. PMH is significant for CKD s/p renal transplant in 06/2017, CAD, hypertension, and hyperlipidemia.  Small bowel obstruction: Acute, improving. Patient denies pain and nausea this morning.  Reports having a bowel movement and continues to pass flatus. NG output on 9/20. 976mL. Per surgery, NG tube to be removed and diet advanced to clears. KUB today indicated worsening of closed-loop small bowel obstruction.  If patient does not tolerate diet we will schedule for exploratory laparotomy..  - General surgery following, appreciate recommendations - Continue SBO protocol with NPO and NGT - diet per gen sx  - Phenergan IV every 6hr as needed - Dilaudid 1 mg IV Q4H PRN; patient required 1 dose on 04/19/2019   AKI, improving Cr 1.45 today from 1.84 yesterday. Likely secondary to dehydration  -- mIVF 218ml/hr  CAD with previous MI in 2006: Chronic, stable. Denies chest pain and shortness of breath.  - Holding home ASA and Coreg 25 mg twice daily while NPO -- patient on Atorvastatin 20mg  daily. Given h/o MI can consider transitioning to high intensity statin when tolerating PO   Hypertension: Chronic, uncontrolled. Hypertensive with BP range 155/95-170/104 in previous 24 hours. Home meds include: Coreg 25mg  BID, Hydralazine 50mg  TID, and Amlodipine 10mg  QD. Given that patient remains  hypertensive will restart BB. IV metoprolol is closest equivalent (confirmed dosing with pharmacy)  - hold home meds while NPO - IV metoprolol 2.5mg  q8h - can consider PRN IV meds if needed while NPO  Hyperlipidemia: Stable. - Hold home atorvastatin 20 mg while NPO,  -- transition to high intensity on dc   S/p Right Kidney Transplant:  Patient is s/p right kidney transplant in December 2018 after being ESRD from 2006-2018. He is currently on immunosuppressants.  Nephrology consulted to convert immunosuppressants from p.o. to IV medicines.  Home medications include: Cellcept, NaBicarb powder, Tacrolimus 4mg  BID, Prednisone 5mg  QD, MagOx 800 BID, and P-Na-K supplementations. - nephrology consulted, appreciate recs - Continue Dexamethasone 1.5mg  IV (eqivalent to Pred 10mg  - inc for physiologic stress), Mycophenolate 1000mg  IV BID, and Tacrolimus 2mg  SL  Pulmonary Nodules: Several small pulmonary nodules over right middle and lower lobe with largest measuring 33mm with recommendation for further evaluation with non-contrast chest CT. Denies cough or hemoptysis.  - recommend outpatient chest CT  FEN/GI: NPO, IV Protonix BID PPx: Lovenox  Disposition: likely home, pending medical clearance   Subjective:  Patient denies pain and nausea today.  There were no significant events overnight.  Reports having a bowel movement this morning and continues to have flatus.  Objective: Temp:  [98.2 F (36.8 C)-98.6 F (37 C)] 98.4 F (36.9 C) (09/21 1640) Pulse Rate:  [66-71] 67 (09/21 1640) Resp:  [16-18] 18 (09/21 1640) BP: (105-159)/(82-100) 105/82 (09/21 1640) SpO2:  [94 %-97 %] 96 % (09/21 1640) Weight:  [102.6 kg] 102.6 kg (09/21 0410)  Physical Exam: GEN: Listening to music, in no acute distress CV: regular  rate and rhythm, no murmurs appreciated  RESP: no increased work of breathing, clear to ascultation bilaterally with no crackles, wheezes, or rhonchi  ABD: Bowel sounds present,  nontender in all quadrants, nondistended, linear surgical incisions x2 MSK: no edema, or cyanosis noted  SKIN: warm, dry  NEURO: grossly normal, moves all extremities appropriately  PSYCH: Normal affect and thought content     Laboratory: Recent Labs  Lab 04/18/19 0543 04/19/19 0558 04/20/19 0646  WBC 12.5* 11.8* 10.4  HGB 16.4 15.6 14.9  HCT 52.9* 49.4 46.4  PLT 142* 134* 131*   Recent Labs  Lab 04/17/19 0935 04/18/19 0543 04/19/19 0609 04/20/19 0646  NA 139 139 139 140  K 4.1 4.5 4.6 3.7  CL 108 105 106 109  CO2 21* 21* 21* 21*  BUN 15 15 20 19   CREATININE 1.59* 1.57* 1.84* 1.45*  CALCIUM 9.8 9.6 9.8 9.1  PROT 7.6  --   --   --   BILITOT 1.0  --   --   --   ALKPHOS 85  --   --   --   ALT 29  --   --   --   AST 28  --   --   --   GLUCOSE 160* 121* 120* 99     Imaging/Diagnostic Tests:  Dg Abd 2 Views  Result Date: 04/20/2019 CLINICAL DATA:  Abdominal tenderness, concern for SBO EXAM: ABDOMEN - 2 VIEW COMPARISON:  04/18/2019 and CT of 04/17/2019 FINDINGS: An NG tube remains in place, side port below the GE junction. Air-fluid levels are noted in the left hemiabdomen with mildly dilated loops of bowel, supine positioning shows bowel loops near 5 cm. Scattered mildly dilated loops of bowel are seen elsewhere no signs of free air. Lung bases are clear. No acute bone finding. IMPRESSION: Signs of small bowel obstruction, closed loop configuration suggested as on previous CT based on pattern dilation with more dilated loops evident in the left hemiabdomen. Electronically Signed   By: Zetta Bills M.D.   On: 04/20/2019 08:19   Dg Abd Portable 1v-small Bowel Obstruction Protocol-initial, 8 Hr Delay  Result Date: 04/18/2019 CLINICAL DATA:  8 hour delay, small-bowel obstruction EXAM: PORTABLE ABDOMEN - 1 VIEW COMPARISON:  04/18/2019, CT 04/17/2019 FINDINGS: Esophageal tube tip in the left upper quadrant. Dilated small bowel in the left mid abdomen measuring up to 4.4 cm.  Possible dilute contrast within the dilated bowel. No radiopaque contrast visible within the colon. IMPRESSION: 1. No radiopaque contrast visible in the colon 2. Dilated loops of left mid abdominal small bowel, felt to correspond to CT demonstrated small bowel obstruction Electronically Signed   By: Donavan Foil M.D.   On: 04/18/2019 21:35     Lyndee Hensen, MD 04/20/2019, 6:32 PM PGY-1, [place] Intern pager: [phone], text pages welcome

## 2019-04-19 NOTE — Progress Notes (Signed)
Patient ambulated 200 ft with this RN. Tolerated well denies abdominal discomfort. Dorthey Sawyer, RN

## 2019-04-19 NOTE — Progress Notes (Addendum)
[facility] Office:  [phone] General Surgery Progress Note   LOS: 2 days  POD -     Chief Complaint: Abdominal pain  Assessment and Plan: 1.  SBO  WBC - 11,800 - 04/19/2019  KUB - yesterday showed dilated loops in left mid abdomen - don't see much contrast  Since he has passed some flatus, will try clamping trial of NGT and allow liquids from floor.  2.  Hx of ESRD s/p kidney transplant in 2018   (kidney sits in RLQ)  On Decadron/Prograf  Creatinine - 1.84 - 04/19/2019 3.  HTN 4.  Hx of MI 5.  GERD 6.  Pulmonary nodules seen on CT scan in right middle/lower lobe - radiology suggest follow up CT of chest 7.  DVT prophylaxis - Lovenox   Active Problems:   SBO (small bowel obstruction) (HCC)  Subjective:  Feels better.  Passed flatus.  Wants something warm to drink.  Objective:   Vitals:   04/19/19 0446 04/19/19 0932  BP: (!) 164/110 (!) 170/104  Pulse: 78 70  Resp: 18 19  Temp: 97.8 F (36.6 C) 99.1 F (37.3 C)  SpO2: 93% 96%     Intake/Output from previous day:  09/19 0701 - 09/20 0700 In: 2149.1 [I.V.:1800; NG/GT:90; IV Piggyback:259.1] Out: 2700 [Urine:150; Emesis/NG output:2550]  Intake/Output this shift:  No intake/output data recorded.   Physical Exam:   General: WN AA M who is alert and oriented.   Has NGT -2,500 cc recorded last 24 hours.   HEENT: Normal. Pupils equal. .   Lungs: Clear.   Abdomen: Soft.  Has BS.  Essentially no abdominal pain.   Lab Results:    Recent Labs    04/18/19 0543 04/19/19 0558  WBC 12.5* 11.8*  HGB 16.4 15.6  HCT 52.9* 49.4  PLT 142* 134*    BMET   Recent Labs    04/18/19 0543 04/19/19 0609  NA 139 139  K 4.5 4.6  CL 105 106  CO2 21* 21*  GLUCOSE 121* 120*  BUN 15 20  CREATININE 1.57* 1.84*  CALCIUM 9.6 9.8    PT/INR  No results for input(s): LABPROT, INR in the last 72 hours.  ABG  No results for input(s): PHART, HCO3 in the last 72 hours.  Invalid input(s): PCO2,  PO2   Studies/Results:  Ct Abdomen Pelvis Wo Contrast  Result Date: 04/17/2019 CLINICAL DATA:  Diffuse abdominal pain beginning this morning with nausea and vomiting. Previous renal transplant 2018. EXAM: CT ABDOMEN AND PELVIS WITHOUT CONTRAST TECHNIQUE: Multidetector CT imaging of the abdomen and pelvis was performed following the standard protocol without IV contrast. COMPARISON:  Abdominopelvic CT 03/31/2012 and chest CT 05/02/2015 FINDINGS: Lower chest: Lung bases demonstrate no significant consolidation or effusion. There is a 6-7 mm nodule over the medial right base. There is a 55mm and 5 mm nodule present over the right middle lobe. These are not well seen on the prior exam. Borderline cardiomegaly. Calcified plaque over the left main, left anterior descending and right coronary arteries. Mild calcified plaque over the descending thoracic aorta. Hepatobiliary: Liver, gallbladder and biliary tree are normal. Pancreas: Normal. Spleen: Normal. Adrenals/Urinary Tract: Adrenal glands are normal. Atrophic native kidneys with a few calcifications. Transplant kidney in the right lower quadrant without hydronephrosis or nephrolithiasis. Visualized right ureter is normal. 6 mm stone over the left posterior dependent portion of the bladder. Stomach/Bowel: Stomach is normal. Surgical suture line over a loop of small bowel over the mid to lower  abdomen just left of midline. There are several mildly dilated fluid-filled small bowel loops in the left mid to upper abdomen measuring up to 4.2 cm in diameter. There are 2 adjacent abruption narrowed small bowel loops in the left mid abdomen as these findings may be seen with a closed loop obstruction. Appendix not visualized. Diverticulosis of the colon. Air and stool present throughout the colon. Surgical suture line over the right colon. Vascular/Lymphatic: Mild calcified plaque over the abdominal aorta and iliac arteries. No significant adenopathy. Reproductive: Normal.  Other: No significant free fluid. Musculoskeletal: No acute findings. IMPRESSION: 1. Fluid-filled dilated small bowel loops in the left mid to upper abdomen. Two adjacent abruptly narrowed small bowel loops left of midline in the mid abdomen as findings may be due to closed loop obstruction from adhesions. 2. Colonic diverticulosis. Postsurgical changes of the large and small bowel. 3. Several small nodules over the right middle and lower lobe with the largest measuring 6 mm. Recommend further evaluation with noncontrast chest CT. 4. Aortic Atherosclerosis (ICD10-I70.0). Atherosclerotic coronary artery disease. 5. Transplant kidney right lower quadrant. Atrophic native kidneys. 6.  6-7 mm bladder stone. Electronically Signed   By: Marin Olp M.D.   On: 04/17/2019 11:41   Dg Abdomen 1 View  Result Date: 04/17/2019 CLINICAL DATA:  Nasogastric tube placement EXAM: ABDOMEN - 1 VIEW COMPARISON:  CT 04/17/2019 FINDINGS: Nasogastric tube courses below the diaphragm with distal tip and side hole terminating in the gastric body. Moderate stool projects throughout the colon. No gross free intraperitoneal air. No gas-filled loops of small bowel. IMPRESSION: NG tube appropriately positioned within the stomach. Electronically Signed   By: Davina Poke M.D.   On: 04/17/2019 13:39   Dg Abd Portable 1v-small Bowel Obstruction Protocol-initial, 8 Hr Delay  Result Date: 04/18/2019 CLINICAL DATA:  8 hour delay, small-bowel obstruction EXAM: PORTABLE ABDOMEN - 1 VIEW COMPARISON:  04/18/2019, CT 04/17/2019 FINDINGS: Esophageal tube tip in the left upper quadrant. Dilated small bowel in the left mid abdomen measuring up to 4.4 cm. Possible dilute contrast within the dilated bowel. No radiopaque contrast visible within the colon. IMPRESSION: 1. No radiopaque contrast visible in the colon 2. Dilated loops of left mid abdominal small bowel, felt to correspond to CT demonstrated small bowel obstruction Electronically Signed    By: Donavan Foil M.D.   On: 04/18/2019 21:35   Dg Abd Portable 1v  Result Date: 04/18/2019 CLINICAL DATA:  Follow-up small bowel obstruction. EXAM: PORTABLE ABDOMEN - 1 VIEW COMPARISON:  04/17/2019, abdominal radiographs and CT. FINDINGS: Partly air-filled mildly dilated loops of small bowel is noted in the left central abdomen. Air stool are noted within the normal caliber colon. NG tube stable within the stomach. IMPRESSION: 1. Single mildly dilated loop of central small bowel. No definite change compared to the previous day's CT scan. Findings reflect ileus or low grade partial obstruction. Electronically Signed   By: Lajean Manes M.D.   On: 04/18/2019 05:37     Anti-infectives:   Anti-infectives (From admission, onward)   None      Alphonsa Overall, MD, FACS Pager: [facility] Office: [phone] 04/19/2019

## 2019-04-19 NOTE — Progress Notes (Addendum)
Family Medicine Teaching Service Daily Progress Note Intern Pager: [phone]  Patient name: Jon [name] Medical record number: [id number] Date of birth: Dec 11, 1955 Age: 63 y.o. Gender: male  Primary Care Provider: Guadalupe Dawn, MD Consultants: General Surgery Code Status: FULL Emergency contact: [place] Bing, brother, at [phone]  Pt Overview and Major Events to Date:  9/18 admit to FPTS, CTA: fluid-filled dilated small bowel loops in L mid to upper abdomen; NG tube placed 9/19: SBO protocol  Assessment and Plan: Jon [name] is a 63 y.o. male presenting with abdominal pain and was found to have an SBO. PMH is significant for CKD s/p renal transplant in 06/2017, CAD, hypertension, and hyperlipidemia.  Small bowel obstruction: Acute, improving. 1.5L NG output on 9/19. Reports continued abdominal pain but improving. Decreased dilaudid use per chart review.  - General surgery consulted, appreciate further recommendations, current plan as outlined below - Continue SBO protocol with NPO and NGT - diet per gen sx  - Phenergan IV every 6 as needed - Dilaudid 1 mg IV every 2 as needed, received 3 doses on 9/19   AKI Cr worsened from 1.57>1.84. Likely 2/2 dehydration  -- Increase fluids to 200 cc/hr -- hopeful we can advance diet soon  CAD with previous MI in 2006: Chronic, stable. No CP or SOB. - Holding home ASA and Coreg 25 mg twice daily while NPO -- patient on Atorvastatin 20mg  daily. Given h/o MI can consider transitioning to high intensity statin when tolerating PO   Hypertension: Chronic, uncontrolled. Hypertensive with BP 164/110. Home meds include: Coreg 25mg  BID, Hydralazine 50mg  TID, and Amlodipine 10mg  QD. Given that patient remains hypertensive will restart BB. IV metoprolol is closest equivalent. Will start at low dose of 2.5mg  q8h (confirmed dosing with pharmacy)  - hold home meds while NPO - IV metop 2.5mg  q8h - can consider PRN IV meds if needed while  NPO  Hyperlipidemia: Stable. - Hold home atorvastatin 20 mg while NPO, can consider transition to high intensity on dc   S/p Right Kidney Transplant:  Patient is s/p right kidney transplant in December 2018 after being ESRD from 2006-2018. He is currently on immunosuppressants.  Nephrology consulted to convert immunosuppressants from p.o. to IV medicines.  Home medications include: Cellcept, NaBicarb powder, Tacrolimus 4mg  BID, Prednisone 5mg  QD, MagOx 800 BID, and P-Na-K supplementations. - nephrology consulted, appreciate recs - Continue Dexamethasone 1.5mg  IV (eqivalent to Pred 10mg  - inc for physiologic stress), Mycophenolate 1000mg  IV BID, and Tacrolimus 2mg  SL  Pulmonary Nodules: Several small pulmonary nodules over right middle and lower lobe with largest measuring 58mm with recommendation for further evaluation with non-contrast chest CT. Denies cough or hemoptysis.  - recommend outpatient chest CT  FEN/GI: NPO, IV Protonix BID PPx: Lovenox  Disposition: continued inpatient stay, transition diet per gen sx  Subjective:  Patient states he is doing better. States abdominal pain is present but improved. Is hopeful to transition diet and shower soon   Objective: Temp:  [97.8 F (36.6 C)-98.9 F (37.2 C)] 97.8 F (36.6 C) (09/20 0446) Pulse Rate:  [78-83] 78 (09/20 0446) Resp:  [18] 18 (09/20 0446) BP: (151-168)/(92-110) 164/110 (09/20 0446) SpO2:  [92 %-97 %] 93 % (09/20 0446) Physical Exam: General: awake and alert, laying in bed, NAD, NG tube in place  Cardiac: RRR, no MRG  Lungs: CTAB, no wheezes, rales, or rhonchi  Abdomen: diffuse tenderness, decreased bowel sounds throughout  Ext: no edema   Laboratory: Recent Labs  Lab 04/17/19 0935 04/18/19 0543  WBC 13.0* 12.5*  HGB 15.8 16.4  HCT 48.7 52.9*  PLT 137* 142*   Recent Labs  Lab 04/17/19 0935 04/18/19 0543  NA 139 139  K 4.1 4.5  CL 108 105  CO2 21* 21*  BUN 15 15  CREATININE 1.59* 1.57*  CALCIUM 9.8  9.6  PROT 7.6  --   BILITOT 1.0  --   ALKPHOS 85  --   ALT 29  --   AST 28  --   GLUCOSE 160* 121*     Imaging/Diagnostic Tests: Result Date: 04/18/2019 CLINICAL DATA:  8 hour delay, small-bowel obstruction EXAM: PORTABLE ABDOMEN - 1 VIEW COMPARISON:  04/18/2019, CT 04/17/2019 FINDINGS: Esophageal tube tip in the left upper quadrant. Dilated small bowel in the left mid abdomen measuring up to 4.4 cm. Possible dilute contrast within the dilated bowel. No radiopaque contrast visible within the colon. IMPRESSION: 1. No radiopaque contrast visible in the colon 2. Dilated loops of left mid abdominal small bowel, felt to correspond to CT demonstrated small bowel obstruction Electronically Signed   By: Donavan Foil M.D.   On: 04/18/2019 21:35   Dg Abd Portable 1v  Result Date: 04/18/2019 CLINICAL DATA:  Follow-up small bowel obstruction. EXAM: PORTABLE ABDOMEN - 1 VIEW COMPARISON:  04/17/2019, abdominal radiographs and CT. FINDINGS: Partly air-filled mildly dilated loops of small bowel is noted in the left central abdomen. Air stool are noted within the normal caliber colon. NG tube stable within the stomach. IMPRESSION: 1. Single mildly dilated loop of central small bowel. No definite change compared to the previous day's CT scan. Findings reflect ileus or low grade partial obstruction. Electronically Signed   By: Lajean Manes M.D.   On: 04/18/2019 05:37    Caroline More, DO 04/19/2019, 6:12 AM PGY-3, [place] Intern pager: [phone], text pages welcome

## 2019-04-19 NOTE — Plan of Care (Signed)
  Problem: Education: Goal: Knowledge of General Education information will improve Description Including pain rating scale, medication(s)/side effects and non-pharmacologic comfort measures Outcome: Progressing   Problem: Health Behavior/Discharge Planning: Goal: Ability to manage health-related needs will improve Outcome: Progressing   

## 2019-04-19 NOTE — Discharge Summary (Signed)
[facility] Discharge Summary  Patient name: Jon [name] Medical record number: [id number] Date of birth: 1955/08/07 Age: 63 y.o. Gender: male Date of Admission: 04/17/2019  Date of Discharge: 04/24/19  Admitting Physician: Lind Covert, MD  Primary Care Provider: Guadalupe Dawn, MD Consultants: General Surgery  Indication for Hospitalization: Small bowel obstruction  Discharge Diagnoses/Problem List:  Small bowel obstruction Acute kidney injury Coronary artery disease Hypertension Hyperlipidemia Right kidney transplant Pulmonary nodules  Disposition: Home  Discharge Condition: Stable  Discharge Exam:  GEN: pleasant, resting in bed watching television CV: regular rate and rhythm, no murmurs appreciated  RESP: no increased work of breathing, clear to ascultation bilaterally with no crackles, wheezes, or rhonchi  ABD: Soft, mildly distended, nontender to palpation, no sounds present, no rebound, no guarding, no CVA tenderness MSK: no lower extremity edema, normal range of motion SKIN: warm, dry NEURO: grossly normal, moves all extremities appropriately PSYCH: Normal affect and thought content   Brief Hospital Course:  Jon [name] is a 63 y.o. male presented after acute onset abdominal pain with associated nausea and vomiting on 04/17/2019.  In the ED, KUB indicated closed-loop bowel obstruction.  General surgery was consulted and was placed on SBO protocol.  Patient began to have abdominal pain after eating and repeat KUB indicated worsening of bowel dilatation.  General surgery was reconsulted however stated  would likely continue to have pain in the upcoming days and is stable for discharge. Nephrology was consulted for recommendations regarding management of immunosuppressive medications while patient was NPO.  Patient was transitioned from IV to oral medications prior to discharge.  On the day of discharge patient diet was advanced to fulls  and he was pain-free. Jon [name] was instructed to advance his diet slowly at home and was discharged with strict return precautions.    Issues for Follow Up:  1. Recommend close follow-up to ensure patient continues to have  having bowel function and remains pain-free. 2. CT ABD/Pelvis indicated patient had pulmonary nodules in right middle/lower lobe.  Per radiology recommendations patient should have a CT chest to further explore these pulmonary nodules. 3. Medication changes: Atorvastatin was transitioned from 20mg  to 40 mg given patient's history of MI in 2016 and CAD.  Patient was prescribed tramadol 50 mg every 6 hours as needed for pain.  Percocet was discontinued.  Significant Procedures: None  Significant Labs and Imaging:  Recent Labs  Lab 04/22/19 0800 04/23/19 0537 04/24/19 0713  WBC 7.3 6.5 9.3  HGB 15.6 14.6 15.8  HCT 47.9 44.5 48.8  PLT 144* 132* 164   Recent Labs  Lab 04/19/19 0609 04/20/19 0646 04/21/19 0555 04/22/19 0800 04/23/19 0537 04/24/19 0713  NA 139 140 136 134* 135 134*  K 4.6 3.7 3.6 3.5 3.2* 3.6  CL 106 109 108 104 104 103  CO2 21* 21* 22 17* 20* 18*  GLUCOSE 120* 99 103* 103* 94 99  BUN 20 19 14 13 13 16   CREATININE 1.84* 1.45* 1.38* 1.47* 1.41* 1.53*  CALCIUM 9.8 9.1 8.3* 9.0 9.0 9.8  MG 1.6*  --   --   --   --   --   PHOS  --   --   --   --   --  2.5  ALBUMIN  --   --   --   --   --  3.9    Dg Abd 1 View  Result Date: 04/23/2019 : Small-bowel obstruction EXAM: ABDOMEN - 1 VIEW COMPARISON:  04/22/2019 FINDINGS: Again noted are dilated loops of small bowel measuring up to approximately 4.3 cm. The enteric tube projects over the patient's gastric body. There is no pneumatosis or free air. IMPRESSION: Findings consistent with ongoing small bowel obstruction. Electronically Signed   By: Constance Holster M.D.   On: 04/23/2019 11:58   Dg Abd 1 View  Result Date: 04/22/2019 CLINICAL DATA:  Small bowel obstruction. Worsening generalized  abdominal pain. EXAM: ABDOMEN - 1 VIEW COMPARISON:  Radiograph 04/20/2019. CT 04/17/2019 FINDINGS: The enteric tube is been removed. There is gaseous gastric distension. Persistent gaseous small bowel distension with slight progression in the right abdomen from prior exam. Enteric sutures noted in the right mid abdomen and central pelvis. No evidence of free air. IMPRESSION: Persistent radiographic findings of small bowel obstruction. Increased gaseous gastric distension and small bowel dilatation in the right abdomen after enteric tube has been removed. Electronically Signed   By: Keith Rake M.D.   On: 04/22/2019 20:41   Dg Abd Portable 1v  Result Date: 04/22/2019 CLINICAL DATA:  NG tube placement EXAM: PORTABLE ABDOMEN - 1 VIEW COMPARISON:  04/22/2019 FINDINGS: NG tube is in place with the tip in the proximal stomach with the side port near the GE junction. Gaseous distention of stomach and small bowel again noted, unchanged. IMPRESSION: NG tube tip in the proximal stomach with the side port at the GE junction. Electronically Signed   By: Rolm Baptise M.D.   On: 04/22/2019 23:47     Results/Tests Pending at Time of Discharge: None  Discharge Medications:  Allergies as of 04/24/2019   No Known Allergies     Medication List    STOP taking these medications   oxyCODONE-acetaminophen 5-325 MG tablet Commonly known as: Percocet   sulfamethoxazole-trimethoprim 200-40 MG/5ML suspension Commonly known as: BACTRIM     TAKE these medications   albuterol 108 (90 Base) MCG/ACT inhaler Commonly known as: VENTOLIN HFA Inhale 2 puffs into the lungs every 6 (six) hours as needed for wheezing or shortness of breath.   amLODipine 10 MG tablet Commonly known as: NORVASC Take 10 mg by mouth at bedtime.   aspirin 81 MG EC tablet Take 1 tablet (81 mg total) by mouth daily.   atorvastatin 40 MG tablet Commonly known as: LIPITOR Take 1 tablet (40 mg total) by mouth daily at 6 PM. What changed:    medication strength  how much to take   carvedilol 25 MG tablet Commonly known as: COREG Take 1 tablet (25 mg total) by mouth 2 (two) times daily with a meal. What changed: when to take this   fluticasone 44 MCG/ACT inhaler Commonly known as: Flovent HFA Inhale 1 puff into the lungs 2 (two) times daily.   hydrALAZINE 50 MG tablet Commonly known as: APRESOLINE Take 1 tablet (50 mg total) by mouth 3 (three) times daily.   magnesium oxide 400 MG tablet Commonly known as: MAG-OX Take 800 mg by mouth 2 (two) times a day. Take 2 hrs before or after Cellcept   mycophenolate 200 MG/ML suspension Commonly known as: CELLCEPT Take 5 mLs by mouth 2 (two) times a day.   Phos-NaK 280-160-250 MG Pack Generic drug: potassium & sodium phosphates Take 2 Packages by mouth 3 (three) times daily.   predniSONE 5 MG/5ML solution Take 5 mLs by mouth daily.   Prograf 1 MG capsule Generic drug: tacrolimus Take 4 mg by mouth 2 (two) times daily.   Sodium Bicarbonate Powd Take 5 mLs by mouth  2 (two) times a day. Baking soda   traMADol 50 MG tablet Commonly known as: ULTRAM Take 1 tablet (50 mg total) by mouth every 6 (six) hours as needed for severe pain.       Discharge Instructions: Please refer to Patient Instructions section of EMR for full details.  Patient was counseled important signs and symptoms that should prompt return to medical care, changes in medications, dietary instructions, activity restrictions, and follow up appointments.   Follow-Up Appointments: Follow-up Information    Guadalupe Dawn, MD. Go on 04/29/2019.   Specialty: Family Medicine Why: Please go to your appointment at 1:30PM. Contact information: [address] [place] [address] [phone]           Lyndee Hensen, MD 04/24/2019, 5:42 PM PGY-1, [place]

## 2019-04-19 NOTE — Plan of Care (Signed)
  Problem: Education: Goal: Knowledge of General Education information will improve Description Including pain rating scale, medication(s)/side effects and non-pharmacologic comfort measures Outcome: Progressing   

## 2019-04-19 NOTE — Progress Notes (Signed)
NG tube clamped at 1030 am per surgery. Patient has taken in 120 ml of liquid in total and voiced concerned at 1545 that he was feeling discomfort. NG reconnected to suction and will remain for 2 hrs per order. Will continue to monitor. Dorthey Sawyer, RN

## 2019-04-20 ENCOUNTER — Inpatient Hospital Stay (HOSPITAL_COMMUNITY): Payer: Medicare Other

## 2019-04-20 LAB — CBC
HCT: 46.4 % (ref 39.0–52.0)
Hemoglobin: 14.9 g/dL (ref 13.0–17.0)
MCH: 28 pg (ref 26.0–34.0)
MCHC: 32.1 g/dL (ref 30.0–36.0)
MCV: 87.1 fL (ref 80.0–100.0)
Platelets: 131 10*3/uL — ABNORMAL LOW (ref 150–400)
RBC: 5.33 MIL/uL (ref 4.22–5.81)
RDW: 13.7 % (ref 11.5–15.5)
WBC: 10.4 10*3/uL (ref 4.0–10.5)
nRBC: 0 % (ref 0.0–0.2)

## 2019-04-20 LAB — BASIC METABOLIC PANEL
Anion gap: 10 (ref 5–15)
BUN: 19 mg/dL (ref 6–20)
CO2: 21 mmol/L — ABNORMAL LOW (ref 22–32)
Calcium: 9.1 mg/dL (ref 8.9–10.3)
Chloride: 109 mmol/L (ref 98–111)
Creatinine, Ser: 1.45 mg/dL — ABNORMAL HIGH (ref 0.61–1.24)
GFR calc Af Amer: >60 mL/min (ref >60)
GFR calc non Af Amer: 52 mL/min — ABNORMAL LOW (ref >60)
Glucose, Bld: 99 mg/dL (ref 70–99)
Potassium: 3.7 mmol/L (ref 3.5–5.1)
Sodium: 140 mmol/L (ref 135–145)

## 2019-04-20 MED ORDER — HYDROMORPHONE HCL 1 MG/ML IJ SOLN
1.0000 mg | INTRAMUSCULAR | Status: DC | PRN
  Administered 2019-04-20: 1 mg via INTRAVENOUS
  Filled 2019-04-20: qty 1

## 2019-04-20 MED ORDER — HYDRALAZINE HCL 20 MG/ML IJ SOLN: 10.0000 mg | Freq: Three times a day (TID) | INTRAMUSCULAR | Status: DC

## 2019-04-20 NOTE — Progress Notes (Signed)
Family Medicine Teaching Service Daily Progress Note Intern Pager: [phone]  Patient name: Jon [name] Medical record number: [id number] Date of birth: July 12, 1956 Age: 63 y.o. Gender: male  Primary Care Provider: Guadalupe Dawn, MD Consultants: General Surgery Code Status: FULL Emergency contact: [place] Bing, brother, at [phone]  Pt Overview and Major Events to Date:  9/18 admit to FPTS, CTA: fluid-filled dilated small bowel loops in L mid to upper abdomen; NG tube placed 9/19: SBO protocol 9/21: Diet advanced clears, NG tube removed   9/22: Diet advances fulls  Assessment and Plan: Makoto Sellitto is a 63 y.o. male presented with abdominal pain and was found to have an SBO. PMH is significant for CKD s/p renal transplant in 06/2017, CAD, hypertension, and hyperlipidemia.  Small bowel obstruction: Acute, improving. Patient denies abdominal pain this morning.  There has been no vomiting he does not endorse nausea.  Continues to have flatus and bowel movements.  Per surgery will advance diet to full's today.  NG was removed yesterday. - General surgery following, appreciate recommendations - Continue SBO protocol  - Diet per gen sx  - Phenergan IV every 6hr as needed - Discontinue Dilaudid 1 mg IV    AKI Cr 1.38 today from 1.45 yesterday. Likely secondary to dehydration  --Discontinue mIVF 258ml/hr  CAD with previous MI in 2006: Chronic, stable. Denies chest pain and shortness of breath.  - Restart ASA and Coreg 25 mg twice daily -- patient on Atorvastatin 20mg  daily. Given h/o MI can consider transitioning to high intensity statin when tolerating PO   Hypertension: Chronic, uncontrolled. Hypertensive with BP range 105/82-154/80 in previous 24 hours. Currently 184/75. Home meds include: Coreg 25mg  BID, Hydralazine 50mg  TID, and Amlodipine 10mg  QD.   - Restart home medications  - Discontinue IV metoprolol 2.5mg  q8h   Hyperlipidemia: Stable. - Hold home atorvastatin 20 mg  while NPO,  -- transition to high intensity on discharge  S/p Right Kidney Transplant:  Patient is s/p right kidney transplant in December 2018 after being ESRD from 2006-2018. He is currently on immunosuppressants.  Nephrology consulted to convert immunosuppressants from p.o. to IV medicines.  Home medications include: Cellcept, NaBicarb powder, Tacrolimus 4mg  BID, Prednisone 5mg  QD, MagOx 800 BID, and P-Na-K supplementations. - nephrology following, appreciate recs - Continue Dexamethasone 1.5mg  IV (eqivalent to Pred 10mg  - inc for physiologic stress), Mycophenolate 1000mg  IV BID, and Tacrolimus 2mg  SL -Restart home meds   Pulmonary Nodules: Several small pulmonary nodules over right middle and lower lobe with largest measuring 44mm with recommendation for further evaluation with non-contrast chest CT. Denies cough or hemoptysis.  - recommend outpatient chest CT   FEN/GI: Clear, IV Protonix BID PPx: Lovenox  Disposition: likely home, pending medical clearance   Subjective:  Patient in no pain this morning.  Continues to have bowel movements denies nausea and vomiting.  Reports abdominal distention with "eating cold foods" .  Wants to take a shower today.  No significant events overnight.  Objective: Temp:  [98.4 F (36.9 C)-98.5 F (36.9 C)] 98.4 F (36.9 C) (09/22 0942) Pulse Rate:  [67-73] 67 (09/22 0942) Resp:  [18-19] 18 (09/22 0942) BP: (105-184)/(75-88) 184/75 (09/22 0942) SpO2:  [94 %-100 %] 94 % (09/22 0942)  Physical Exam: GEN: pleasant, sitting up in bed watching television.   CV: regular rate and rhythm, no murmurs appreciated  RESP: no increased work of breathing, clear to ascultation bilaterally with no crackles, wheezes, or rhonchi  ABD: Bowel sounds present. Soft, Nontender, mildly distended MSK:  no edema, or cyanosis noted SKIN: warm, dry NEURO: grossly normal, moves all extremities appropriately PSYCH: Normal affect and thought  content    Laboratory: Recent Labs  Lab 04/19/19 0558 04/20/19 0646 04/21/19 0555  WBC 11.8* 10.4 5.9  HGB 15.6 14.9 13.4  HCT 49.4 46.4 42.7  PLT 134* 131* 108*   Recent Labs  Lab 04/17/19 0935  04/19/19 0609 04/20/19 0646 04/21/19 0555  NA 139   < > 139 140 136  K 4.1   < > 4.6 3.7 3.6  CL 108   < > 106 109 108  CO2 21*   < > 21* 21* 22  BUN 15   < > 20 19 14   CREATININE 1.59*   < > 1.84* 1.45* 1.38*  CALCIUM 9.8   < > 9.8 9.1 8.3*  PROT 7.6  --   --   --   --   BILITOT 1.0  --   --   --   --   ALKPHOS 85  --   --   --   --   ALT 29  --   --   --   --   AST 28  --   --   --   --   GLUCOSE 160*   < > 120* 99 103*   < > = values in this interval not displayed.     Imaging/Diagnostic Tests:  Dg Abd 2 Views  Result Date: 04/20/2019 CLINICAL DATA:  Abdominal tenderness, concern for SBO EXAM: ABDOMEN - 2 VIEW COMPARISON:  04/18/2019 and CT of 04/17/2019 FINDINGS: An NG tube remains in place, side port below the GE junction. Air-fluid levels are noted in the left hemiabdomen with mildly dilated loops of bowel, supine positioning shows bowel loops near 5 cm. Scattered mildly dilated loops of bowel are seen elsewhere no signs of free air. Lung bases are clear. No acute bone finding. IMPRESSION: Signs of small bowel obstruction, closed loop configuration suggested as on previous CT based on pattern dilation with more dilated loops evident in the left hemiabdomen. Electronically Signed   By: Zetta Bills M.D.   On: 04/20/2019 08:19     Lyndee Hensen, MD 04/21/2019, 9:45 AM PGY-1, [place] Intern pager: [phone], text pages welcome

## 2019-04-20 NOTE — Progress Notes (Signed)
[facility] Progress Note    Assessment/ Plan:   1. ESRD s/p DDKT: 07/15/2017. Will convert PO to IV. Will do dex 1.5 mg IV (pred 10mg , not 5 mg, a little more in setting of physiologic stress), mycophenolate 1000 mg IV q 12, and tac 2 mg sublingual-->dose adjustment for SL administration. Will need 3 1/2 lives for washout and 3 1/2 lives till new level is stable; therefore, will check Tac level on Wed. Baseline Cr 1.5-1.6 -> back to his baseline.  2. SBO: NPO, NGT in, surg following. Clinically improving even though KUB looks a little worse. Per surgical recs -> advance and monitor if tolerates.  3. HTN: monitor, holding antihypertensives at this time, can add back when taking PO.  BP above goal right now .  Should be able to restart the antihtn in 24-48hrs. Hydralazine PRN.  4. Dispo: inpt  Subjective:   Feels better. Denies f/c/n/v.   Objective:   BP (!) 141/98 (BP Location: Left Arm)   Pulse 71   Temp 98.6 F (37 C) (Oral)   Resp 18   Wt 102.6 kg   SpO2 94%   BMI 30.66 kg/m   Intake/Output Summary (Last 24 hours) at 04/20/2019 1555 Last data filed at 04/20/2019 1300 Gross per 24 hour  Intake 3289.33 ml  Output 1975 ml  Net 1314.33 ml   Weight change:   Physical Exam: GEN NAD, ambulating NECK no JVD PULM clear bilaterally CV RRR loud S2 ABD soft, + hypoactive BS EXT no LE edema   Imaging: Dg Abd 2 Views  Result Date: 04/20/2019 CLINICAL DATA:  Abdominal tenderness, concern for SBO EXAM: ABDOMEN - 2 VIEW COMPARISON:  04/18/2019 and CT of 04/17/2019 FINDINGS: An NG tube remains in place, side port below the GE junction. Air-fluid levels are noted in the left hemiabdomen with mildly dilated loops of bowel, supine positioning shows bowel loops near 5 cm. Scattered mildly dilated loops of bowel are seen elsewhere no signs of free air. Lung bases are clear. No acute bone finding. IMPRESSION: Signs of small bowel obstruction, closed loop  configuration suggested as on previous CT based on pattern dilation with more dilated loops evident in the left hemiabdomen. Electronically Signed   By: Zetta Bills M.D.   On: 04/20/2019 08:19   Dg Abd Portable 1v-small Bowel Obstruction Protocol-initial, 8 Hr Delay  Result Date: 04/18/2019 CLINICAL DATA:  8 hour delay, small-bowel obstruction EXAM: PORTABLE ABDOMEN - 1 VIEW COMPARISON:  04/18/2019, CT 04/17/2019 FINDINGS: Esophageal tube tip in the left upper quadrant. Dilated small bowel in the left mid abdomen measuring up to 4.4 cm. Possible dilute contrast within the dilated bowel. No radiopaque contrast visible within the colon. IMPRESSION: 1. No radiopaque contrast visible in the colon 2. Dilated loops of left mid abdominal small bowel, felt to correspond to CT demonstrated small bowel obstruction Electronically Signed   By: Donavan Foil M.D.   On: 04/18/2019 21:35    Labs: BMET Recent Labs  Lab 04/17/19 0935 04/18/19 0543 04/19/19 0609 04/20/19 0646  NA 139 139 139 140  K 4.1 4.5 4.6 3.7  CL 108 105 106 109  CO2 21* 21* 21* 21*  GLUCOSE 160* 121* 120* 99  BUN 15 15 20 19   CREATININE 1.59* 1.57* 1.84* 1.45*  CALCIUM 9.8 9.6 9.8 9.1   CBC Recent Labs  Lab 04/17/19 0935 04/18/19 0543 04/19/19 0558 04/20/19 0646  WBC 13.0* 12.5* 11.8* 10.4  HGB 15.8 16.4 15.6 14.9  HCT 48.7  52.9* 49.4 46.4  MCV 86.7 88.2 87.9 87.1  PLT 137* 142* 134* 131*    Medications:    . dexamethasone (DECADRON) injection  1.52 mg Intravenous Daily  . enoxaparin (LOVENOX) injection  40 mg Subcutaneous Q24H  . metoprolol tartrate  2.5 mg Intravenous Q8H  . pantoprazole (PROTONIX) IV  40 mg Intravenous Q12H  . tacrolimus  2 mg Sublingual BID      Otelia Santee, MD 04/20/2019, 3:55 PM

## 2019-04-20 NOTE — Progress Notes (Signed)
Occupational Therapy Evaluation Patient Details Name: Jon [name] MRN: [id number] DOB: 29-Apr-1956 Today's Date: 04/20/2019    History of Present Illness Jon [name] is a 63 y.o. male presenting with abdominal pain and was found to have an SBO. PMH is significant for CKD s/p renal transplant in 06/2017, CAD, hypertension, and hyperlipidemia.   Clinical Impression   PTA, pt was living at home alone with brother and sister living within walking distance, pt reports he was independent with ADL/IADL and functional mobility. Pt currently completes ADL and functional mobility independently.Patient evaluated by Occupational Therapy with no further acute OT needs identified. All education has been completed and the patient has no further questions. See below for any follow-up Occupational Therapy or equipment needs. OT to sign off. Thank you for referral.      Follow Up Recommendations  No OT follow up    Equipment Recommendations  None recommended by OT    Recommendations for Other Services       Precautions / Restrictions Precautions Precaution Comments: NG Tube      Mobility Bed Mobility Overal bed mobility: Independent                Transfers Overall transfer level: Independent                    Balance Overall balance assessment: Independent                                         ADL either performed or assessed with clinical judgement   ADL Overall ADL's : At baseline                                       General ADL Comments: pt appears to be functioning at baseline completing ADL independently;pt demonstrates ability to complete grooming,dressing, transfers independently     Vision         Perception     Praxis      Pertinent Vitals/Pain Pain Assessment: No/denies pain Pain Intervention(s): Monitored during session     Hand Dominance     Extremity/Trunk Assessment Upper Extremity Assessment Upper  Extremity Assessment: Overall WFL for tasks assessed   Lower Extremity Assessment Lower Extremity Assessment: Overall WFL for tasks assessed   Cervical / Trunk Assessment Cervical / Trunk Assessment: Normal   Communication Communication Communication: No difficulties   Cognition Arousal/Alertness: Awake/alert Behavior During Therapy: WFL for tasks assessed/performed Overall Cognitive Status: Within Functional Limits for tasks assessed                                     General Comments       Exercises     Shoulder Instructions      Home Living Family/patient expects to be discharged to:: Private residence Living Arrangements: Alone Available Help at Discharge: Available PRN/intermittently Type of Home: House Home Access: Stairs to enter [employer] of Steps: 4   Home Layout: One level                          Prior Functioning/Environment Level of Independence: Independent  OT Problem List: Decreased safety awareness;Decreased knowledge of precautions      OT Treatment/Interventions:      OT Goals(Current goals can be found in the care plan section) Acute Rehab OT Goals Patient Stated Goal: to go home OT Goal Formulation: With patient Time For Goal Achievement: 05/04/19 Potential to Achieve Goals: Good  OT Frequency:     Barriers to D/C:            Co-evaluation              AM-PAC OT "6 Clicks" Daily Activity     Outcome Measure Help from another person eating meals?: None Help from another person taking care of personal grooming?: None Help from another person toileting, which includes using toliet, bedpan, or urinal?: None Help from another person bathing (including washing, rinsing, drying)?: None Help from another person to put on and taking off regular upper body clothing?: None Help from another person to put on and taking off regular lower body clothing?: None 6 Click Score: 24    End of Session Nurse Communication: Mobility status  Activity Tolerance: Patient tolerated treatment well Patient left: in chair;with call bell/phone within reach  OT Visit Diagnosis: Other abnormalities of gait and mobility (R26.89)                Time: 1004-1016 OT Time Calculation (min): 12 min Charges:  OT General Charges $OT Visit: 1 Visit OT Evaluation $OT Eval Low Complexity: [place] OTR/L Acute Rehabilitation Services Office: [phone]   Wyn Forster 04/20/2019, 10:48 AM

## 2019-04-20 NOTE — Progress Notes (Signed)
[facility] Progress Note     Subjective: CC: sbo Patient reports he is having BMs - 2 large ones this AM, somewhat loose. Denies nausea. Tolerated NGT being clamped and ice chips. Wants NGT out of his nose because it smells bad.   Objective: Vital signs in last 24 hours: Temp:  [98.2 F (36.8 C)-98.7 F (37.1 C)] 98.6 F (37 C) (09/21 0851) Pulse Rate:  [66-84] 71 (09/21 0851) Resp:  [16-19] 18 (09/21 0851) BP: (141-163)/(95-147) 141/98 (09/21 0851) SpO2:  [94 %-98 %] 94 % (09/21 0851) Weight:  [102.6 kg] 102.6 kg (09/21 0410) Last BM Date: 04/19/19  Intake/Output from previous day: 09/20 0701 - 09/21 0700 In: 3927.9 [P.O.:570; I.V.:2891.4; IV Piggyback:466.5] Out: 2220 [Urine:1270; Emesis/NG output:950] Intake/Output this shift: Total I/O In: -  Out: 250 [Urine:250]  PE: Gen:  Alert, NAD, pleasant Card:  Regular rate and rhythm, pedal pulses 2+ BL Pulm:  Normal effort, clear to auscultation bilaterally Abd: Soft, non-tender, mildly distended, +BS Skin: warm and dry, no rashes  Psych: A&Ox3   Lab Results:  Recent Labs    04/19/19 0558 04/20/19 0646  WBC 11.8* 10.4  HGB 15.6 14.9  HCT 49.4 46.4  PLT 134* 131*   BMET Recent Labs    04/19/19 0609 04/20/19 0646  NA 139 140  K 4.6 3.7  CL 106 109  CO2 21* 21*  GLUCOSE 120* 99  BUN 20 19  CREATININE 1.84* 1.45*  CALCIUM 9.8 9.1   PT/INR No results for input(s): LABPROT, INR in the last 72 hours. CMP     Component Value Date/Time   NA 140 04/20/2019 0646   K 3.7 04/20/2019 0646   CL 109 04/20/2019 0646   CO2 21 (L) 04/20/2019 0646   GLUCOSE 99 04/20/2019 0646   BUN 19 04/20/2019 0646   CREATININE 1.45 (H) 04/20/2019 0646   CALCIUM 9.1 04/20/2019 0646   PROT 7.6 04/17/2019 0935   ALBUMIN 4.5 04/17/2019 0935   AST 28 04/17/2019 0935   ALT 29 04/17/2019 0935   ALKPHOS 85 04/17/2019 0935   BILITOT 1.0 04/17/2019 0935   GFRNONAA 52 (L) 04/20/2019 0646   GFRAA >60 04/20/2019 0646    Lipase     Component Value Date/Time   LIPASE 24 04/17/2019 0935       Studies/Results: Dg Abd 2 Views  Result Date: 04/20/2019 CLINICAL DATA:  Abdominal tenderness, concern for SBO EXAM: ABDOMEN - 2 VIEW COMPARISON:  04/18/2019 and CT of 04/17/2019 FINDINGS: An NG tube remains in place, side port below the GE junction. Air-fluid levels are noted in the left hemiabdomen with mildly dilated loops of bowel, supine positioning shows bowel loops near 5 cm. Scattered mildly dilated loops of bowel are seen elsewhere no signs of free air. Lung bases are clear. No acute bone finding. IMPRESSION: Signs of small bowel obstruction, closed loop configuration suggested as on previous CT based on pattern dilation with more dilated loops evident in the left hemiabdomen. Electronically Signed   By: Zetta Bills M.D.   On: 04/20/2019 08:19   Dg Abd Portable 1v-small Bowel Obstruction Protocol-initial, 8 Hr Delay  Result Date: 04/18/2019 CLINICAL DATA:  8 hour delay, small-bowel obstruction EXAM: PORTABLE ABDOMEN - 1 VIEW COMPARISON:  04/18/2019, CT 04/17/2019 FINDINGS: Esophageal tube tip in the left upper quadrant. Dilated small bowel in the left mid abdomen measuring up to 4.4 cm. Possible dilute contrast within the dilated bowel. No radiopaque contrast visible within the colon. IMPRESSION: 1. No radiopaque  contrast visible in the colon 2. Dilated loops of left mid abdominal small bowel, felt to correspond to CT demonstrated small bowel obstruction Electronically Signed   By: Donavan Foil M.D.   On: 04/18/2019 21:35    Anti-infectives: Anti-infectives (From admission, onward)   None       Assessment/Plan Hx of ESRD s/p kidney transplant in 2018 (kidney sits in RLQ) HTN HLD Hx of MI GERD Pulmonary nodules seen on CT scan in right middle/lower lobe - radiology suggest follow up CT of chest  SBO - CT today with dilated loops in left mid to upper abdomen, some concern for closed loop  obstruction - abdominal film this AM looks slightly worse but clinically patient improving - Having bowel function - d/c NGT and start CLD, if improving will advance as tolerated, if patient does not tolerate CLD then likely needs surgical exploration  FEN: CLD VTE: SCDs, lovenox ID: no abx indicated at this time  LOS: 3 days    Brigid Re , [facility] 04/20/2019, 10:18 AM Pager: [phone] Consults: [phone] 7:00 AM - 4:30 PM M, W-F 7:00 AM - 11:30 AM Tues, Sat, Sun

## 2019-04-20 NOTE — Progress Notes (Signed)
FPTS Interim Progress Note  Checked in on patient this evening. Patient is tolerating clear liquid diet without any nausea/vomiting. Passing flatus and had 1 bowel movement today. He has been ambulating as well.  - decrease mIVF to 144ml/hr - continue CLD, hopeful to advance in AM per surg recs - consider transition to PO meds in AM as well if continues to tolerate  Danna Hefty, DO 04/20/2019, 6:16 PM PGY-2, [facility] pager [phone]

## 2019-04-21 LAB — CBC
HCT: 42.7 % (ref 39.0–52.0)
Hemoglobin: 13.4 g/dL (ref 13.0–17.0)
MCH: 27.3 pg (ref 26.0–34.0)
MCHC: 31.4 g/dL (ref 30.0–36.0)
MCV: 87.1 fL (ref 80.0–100.0)
Platelets: 108 10*3/uL — ABNORMAL LOW (ref 150–400)
RBC: 4.9 MIL/uL (ref 4.22–5.81)
RDW: 13.5 % (ref 11.5–15.5)
WBC: 5.9 10*3/uL (ref 4.0–10.5)
nRBC: 0 % (ref 0.0–0.2)

## 2019-04-21 LAB — BASIC METABOLIC PANEL
Anion gap: 6 (ref 5–15)
BUN: 14 mg/dL (ref 6–20)
CO2: 22 mmol/L (ref 22–32)
Calcium: 8.3 mg/dL — ABNORMAL LOW (ref 8.9–10.3)
Chloride: 108 mmol/L (ref 98–111)
Creatinine, Ser: 1.38 mg/dL — ABNORMAL HIGH (ref 0.61–1.24)
GFR calc Af Amer: >60 mL/min (ref >60)
GFR calc non Af Amer: 55 mL/min — ABNORMAL LOW (ref >60)
Glucose, Bld: 103 mg/dL — ABNORMAL HIGH (ref 70–99)
Potassium: 3.6 mmol/L (ref 3.5–5.1)
Sodium: 136 mmol/L (ref 135–145)

## 2019-04-21 MED ORDER — PREDNISONE 5 MG PO TABS
5.0000 mg | ORAL_TABLET | Freq: Every day | ORAL | Status: DC
  Administered 2019-04-22: 5 mg via ORAL
  Filled 2019-04-21: qty 1

## 2019-04-21 MED ORDER — TACROLIMUS 1 MG PO CAPS
4.0000 mg | ORAL_CAPSULE | Freq: Two times a day (BID) | ORAL | Status: DC
  Administered 2019-04-21 – 2019-04-22 (×3): 4 mg via ORAL
  Filled 2019-04-21 (×3): qty 4

## 2019-04-21 MED ORDER — HYDRALAZINE HCL 50 MG PO TABS
50.0000 mg | ORAL_TABLET | Freq: Three times a day (TID) | ORAL | Status: DC
  Administered 2019-04-21 – 2019-04-22 (×5): 50 mg via ORAL
  Filled 2019-04-21 (×5): qty 1

## 2019-04-21 MED ORDER — AMLODIPINE BESYLATE 10 MG PO TABS
10.0000 mg | ORAL_TABLET | Freq: Every day | ORAL | Status: DC
  Administered 2019-04-21 – 2019-04-22 (×2): 10 mg via ORAL
  Filled 2019-04-21 (×2): qty 1

## 2019-04-21 MED ORDER — PANTOPRAZOLE SODIUM 40 MG PO TBEC
40.0000 mg | DELAYED_RELEASE_TABLET | Freq: Every day | ORAL | Status: DC
  Administered 2019-04-21 – 2019-04-22 (×2): 40 mg via ORAL
  Filled 2019-04-21 (×2): qty 1

## 2019-04-21 MED ORDER — MYCOPHENOLATE 200 MG/ML ORAL SUSPENSION
1000.0000 mg | Freq: Two times a day (BID) | ORAL | Status: DC
  Administered 2019-04-21 – 2019-04-22 (×4): 1000 mg via ORAL
  Filled 2019-04-21 (×6): qty 20

## 2019-04-21 NOTE — Progress Notes (Signed)
Family Medicine Teaching Service Daily Progress Note Intern Pager: [phone]  Patient name: Jon [name] Medical record number: [id number] Date of birth: [birth year] Age: 63 y.o. Gender: male  Primary Care Provider: Guadalupe Dawn, MD Consultants: General Surgery Code Status: FULL Emergency contact: Jon [name], brother, at [phone]  Pt Overview and Major Events to Date:  9/18 admit to FPTS, CTA: fluid-filled dilated small bowel loops in L mid to upper abdomen; NG tube placed 9/19: SBO protocol 9/21: Diet advanced clears, NG tube removed   9/22: Diet advanced fulls 9/23: Diet advanced regular diet  Assessment and Plan: Jon [name] is a 63 y.o. male presented with abdominal pain and was found to have an SBO. PMH is significant for CKD s/p renal transplant in 06/2017, CAD, hypertension, and hyperlipidemia.  Small bowel obstruction: Acute, improving. Patient reports hiccups and lower abdominal pain.  Low-dose tramadol for pain.  Will reassess abdominal pain in the morning.  Pain still present we will get a KUB and reconsult general surgery - General surgery signed off appreciate recommendations - Continue SBO protocol  - Advance diet per general surgery  - 50 mg tramadol every 6 hours   AKI, resolved  Baseline 1.5-1.6.  --Continue to trend Cr  --Daily BMP   CAD with previous MI in 2006: Chronic, stable. - Continue ASA and Coreg 25 mg twice daily -- patient on Atorvastatin 20mg  daily. Given h/o MI can consider transitioning to high intensity statin when tolerating PO   Hypertension: Chronic, uncontrolled. Hypertensive.  Continue to follow blood pressures. Home meds include: Coreg 25mg  BID, Hydralazine 50mg  TID, and Amlodipine 10mg  QD.   - Continue home medications   Hyperlipidemia: Stable. - Restart home atorvastatin 20 mg -- transition to high intensity on discharge  S/p Right Kidney Transplant:  Patient is s/p right kidney transplant in December 2018 after being ESRD  from 2006-2018. He is currently on immunosuppressants. Home medications include: Cellcept, NaBicarb powder, Tacrolimus 4mg  BID, Prednisone 5mg  QD, MagOx 800 BID, and P-Na-K supplementations. - nephrology consulted, appreciate recommendations -Continue Prograf 4 mg BID, Cellcept 1000 mg BID, Prednisone 5 mg daily   Pulmonary Nodules: Several small pulmonary nodules over right middle and lower lobe with largest measuring 64mm with recommendation for further evaluation with non-contrast chest CT. Denies cough or hemoptysis.  - recommend outpatient chest CT  FEN/GI: Regular, IV Protonix BID PPx: Lovenox  Disposition: Home possibly tomorrow  Subjective:  Patient with hiccups this morning.  Significant events overnight  Objective: Temp:  [98.1 F (36.7 C)-99.1 F (37.3 C)] 98.5 F (36.9 C) (09/23 0929) Pulse Rate:  [64-90] 72 (09/23 0929) Resp:  [18] 18 (09/23 0929) BP: (141-184)/(75-148) 157/89 (09/23 0929) SpO2:  [94 %-97 %] 96 % (09/23 0929)  Physical Exam: GEN: Watching television no acute distress CV: regular rate and rhythm RESP: no increased work of breathing, clear to ascultation bilaterally ABD: soft, nontender to palpation in all quadrants, mildly distended, no rebound, no guarding  MSK: no edema, or cyanosis noted SKIN: warm, dry NEURO: grossly normal, moves all extremities appropriately      Laboratory: Recent Labs  Lab 04/20/19 0646 04/21/19 0555 04/22/19 0800  WBC 10.4 5.9 7.3  HGB 14.9 13.4 15.6  HCT 46.4 42.7 47.9  PLT 131* 108* 144*   Recent Labs  Lab 04/17/19 0935  04/20/19 0646 04/21/19 0555 04/22/19 0800  NA 139   < > 140 136 134*  K 4.1   < > 3.7 3.6 3.5  CL 108   < >  109 108 104  CO2 21*   < > 21* 22 17*  BUN 15   < > 19 14 13   CREATININE 1.59*   < > 1.45* 1.38* 1.47*  CALCIUM 9.8   < > 9.1 8.3* 9.0  PROT 7.6  --   --   --   --   BILITOT 1.0  --   --   --   --   ALKPHOS 85  --   --   --   --   ALT 29  --   --   --   --   AST 28  --    --   --   --   GLUCOSE 160*   < > 99 103* 103*   < > = values in this interval not displayed.     Imaging/Diagnostic Tests:  No results found.   Jon Hensen, MD 04/22/2019, 9:40 AM PGY-1, [place] Intern pager: [phone], text pages welcome

## 2019-04-21 NOTE — Progress Notes (Signed)
[facility] Progress Note     Subjective: CC: sbo Patient tolerated CLD and still having bowel function. BM this AM was loose. Denies nausea or vomiting. Some abdominal pain still in central abdomen but this remains mild.   Objective: Vital signs in last 24 hours: Temp:  [98.4 F (36.9 C)-98.5 F (36.9 C)] 98.5 F (36.9 C) (09/22 0511) Pulse Rate:  [67-73] 73 (09/22 0511) Resp:  [18-19] 19 (09/22 0511) BP: (105-154)/(80-88) 154/80 (09/22 0511) SpO2:  [94 %-100 %] 94 % (09/22 0511) Last BM Date: 04/20/19  Intake/Output from previous day: 09/21 0701 - 09/22 0700 In: 5132.5 [P.O.:1210; I.V.:3922.5] Out: 1300 [Urine:1300] Intake/Output this shift: Total I/O In: 240 [P.O.:240] Out: 450 [Urine:450]  PE: Gen:  Alert, NAD, pleasant Card:  Regular rate and rhythm Pulm:  Normal effort, clear to auscultation bilaterally Abd: Soft, non-tender, mildly distended, +BS Skin: warm and dry, no rashes  Psych: A&Ox3   Lab Results:  Recent Labs    04/20/19 0646 04/21/19 0555  WBC 10.4 5.9  HGB 14.9 13.4  HCT 46.4 42.7  PLT 131* 108*   BMET Recent Labs    04/20/19 0646 04/21/19 0555  NA 140 136  K 3.7 3.6  CL 109 108  CO2 21* 22  GLUCOSE 99 103*  BUN 19 14  CREATININE 1.45* 1.38*  CALCIUM 9.1 8.3*   PT/INR No results for input(s): LABPROT, INR in the last 72 hours. CMP     Component Value Date/Time   NA 136 04/21/2019 0555   K 3.6 04/21/2019 0555   CL 108 04/21/2019 0555   CO2 22 04/21/2019 0555   GLUCOSE 103 (H) 04/21/2019 0555   BUN 14 04/21/2019 0555   CREATININE 1.38 (H) 04/21/2019 0555   CALCIUM 8.3 (L) 04/21/2019 0555   PROT 7.6 04/17/2019 0935   ALBUMIN 4.5 04/17/2019 0935   AST 28 04/17/2019 0935   ALT 29 04/17/2019 0935   ALKPHOS 85 04/17/2019 0935   BILITOT 1.0 04/17/2019 0935   GFRNONAA 55 (L) 04/21/2019 0555   GFRAA >60 04/21/2019 0555   Lipase     Component Value Date/Time   LIPASE 24 04/17/2019 0935        Studies/Results: Dg Abd 2 Views  Result Date: 04/20/2019 CLINICAL DATA:  Abdominal tenderness, concern for SBO EXAM: ABDOMEN - 2 VIEW COMPARISON:  04/18/2019 and CT of 04/17/2019 FINDINGS: An NG tube remains in place, side port below the GE junction. Air-fluid levels are noted in the left hemiabdomen with mildly dilated loops of bowel, supine positioning shows bowel loops near 5 cm. Scattered mildly dilated loops of bowel are seen elsewhere no signs of free air. Lung bases are clear. No acute bone finding. IMPRESSION: Signs of small bowel obstruction, closed loop configuration suggested as on previous CT based on pattern dilation with more dilated loops evident in the left hemiabdomen. Electronically Signed   By: Zetta Bills M.D.   On: 04/20/2019 08:19    Anti-infectives: Anti-infectives (From admission, onward)   None       Assessment/Plan Hx of ESRD s/p kidney transplant in 2018(kidney sits in RLQ) HTN HLD Hx of MI GERD Pulmonary nodules seen on CT scan in right middle/lower lobe - radiology suggest follow up CT of chest  SBO - CT with dilated loops in left mid to upper abdomen, some concern for closed loop obstruction - Having bowel function - tolerating CLD, advance to FLD - continue to mobilize  FEN: FLD, decreased IVF VTE: SCDs, lovenox ID:  no abx indicated at this time  LOS: 4 days    Brigid Re , [facility] 04/21/2019, 9:24 AM Pager: [phone] Consults: [phone] 7:00 AM - 4:30 PM M, W-F 7:00 AM - 11:30 AM Tues, Sat, Sun

## 2019-04-21 NOTE — Progress Notes (Signed)
  [facility] Progress Note    Assessment/ Plan:   1. ESRD s/p DDKT: 07/15/2017. Will convert PO to IV. Will do dex 1.5 mg IV (pred 10mg , not 5 mg, a little more in setting of physiologic stress), mycophenolate 1000 mg IV q 12, and tac 2 mg sublingual-->dose adjustment for SL administration.Will need 3 1/2 lives for washout and 3 1/2 lives till new level is stable; therefore, will check Tac level on Wed. Baseline Cr 1.5-1.6 -> back to his baseline.  - Restarted prograf 4mg  bid, cellcept 1000mg  BID and prednisone 5mg  daily.  2. SBO: being advanced to FLD. Per surgical recs -> advance and monitor if tolerates.  3. HTN: monitor, tolerating liquid diet.  Will restart home  Antihtn meds.  4. Dispo: inpt  Subjective:   Feels better. Denies f/c/n/v.   Objective:   BP (!) 184/75 (BP Location: Right Arm)   Pulse 67   Temp 98.4 F (36.9 C) (Oral)   Resp 18   Ht 6' (1.829 m)   Wt 102.6 kg   SpO2 94%   BMI 30.66 kg/m   Intake/Output Summary (Last 24 hours) at 04/21/2019 1312 Last data filed at 04/21/2019 3976 Gross per 24 hour  Intake 4732.5 ml  Output 1500 ml  Net 3232.5 ml   Weight change:   Physical Exam: GEN NAD, ambulating NECK no JVD PULM clear bilaterally CV RRR loud S2 ABD soft,+ hypoactive BS EXT no LE edema  Imaging: Dg Abd 2 Views  Result Date: 04/20/2019 CLINICAL DATA:  Abdominal tenderness, concern for SBO EXAM: ABDOMEN - 2 VIEW COMPARISON:  04/18/2019 and CT of 04/17/2019 FINDINGS: An NG tube remains in place, side port below the GE junction. Air-fluid levels are noted in the left hemiabdomen with mildly dilated loops of bowel, supine positioning shows bowel loops near 5 cm. Scattered mildly dilated loops of bowel are seen elsewhere no signs of free air. Lung bases are clear. No acute bone finding. IMPRESSION: Signs of small bowel obstruction, closed loop configuration suggested as on previous CT based on pattern dilation with more  dilated loops evident in the left hemiabdomen. Electronically Signed   By: Zetta Bills M.D.   On: 04/20/2019 08:19    Labs: BMET Recent Labs  Lab 04/17/19 0935 04/18/19 0543 04/19/19 0609 04/20/19 0646 04/21/19 0555  NA 139 139 139 140 136  K 4.1 4.5 4.6 3.7 3.6  CL 108 105 106 109 108  CO2 21* 21* 21* 21* 22  GLUCOSE 160* 121* 120* 99 103*  BUN 15 15 20 19 14   CREATININE 1.59* 1.57* 1.84* 1.45* 1.38*  CALCIUM 9.8 9.6 9.8 9.1 8.3*   CBC Recent Labs  Lab 04/18/19 0543 04/19/19 0558 04/20/19 0646 04/21/19 0555  WBC 12.5* 11.8* 10.4 5.9  HGB 16.4 15.6 14.9 13.4  HCT 52.9* 49.4 46.4 42.7  MCV 88.2 87.9 87.1 87.1  PLT 142* 134* 131* 108*    Medications:    . enoxaparin (LOVENOX) injection  40 mg Subcutaneous Q24H  . metoprolol tartrate  2.5 mg Intravenous Q8H  . mycophenolate  1,000 mg Oral BID  . pantoprazole  40 mg Oral Daily  . [START ON 04/22/2019] predniSONE  5 mg Oral Q breakfast  . tacrolimus  4 mg Oral BID      Otelia Santee, MD 04/21/2019, 1:12 PM

## 2019-04-22 ENCOUNTER — Inpatient Hospital Stay (HOSPITAL_COMMUNITY): Payer: Medicare Other

## 2019-04-22 LAB — BASIC METABOLIC PANEL
Anion gap: 13 (ref 5–15)
BUN: 13 mg/dL (ref 6–20)
CO2: 17 mmol/L — ABNORMAL LOW (ref 22–32)
Calcium: 9 mg/dL (ref 8.9–10.3)
Chloride: 104 mmol/L (ref 98–111)
Creatinine, Ser: 1.47 mg/dL — ABNORMAL HIGH (ref 0.61–1.24)
GFR calc Af Amer: 59 mL/min — ABNORMAL LOW (ref >60)
GFR calc non Af Amer: 51 mL/min — ABNORMAL LOW (ref >60)
Glucose, Bld: 103 mg/dL — ABNORMAL HIGH (ref 70–99)
Potassium: 3.5 mmol/L (ref 3.5–5.1)
Sodium: 134 mmol/L — ABNORMAL LOW (ref 135–145)

## 2019-04-22 LAB — CBC
HCT: 47.9 % (ref 39.0–52.0)
Hemoglobin: 15.6 g/dL (ref 13.0–17.0)
MCH: 27.8 pg (ref 26.0–34.0)
MCHC: 32.6 g/dL (ref 30.0–36.0)
MCV: 85.4 fL (ref 80.0–100.0)
Platelets: 144 10*3/uL — ABNORMAL LOW (ref 150–400)
RBC: 5.61 MIL/uL (ref 4.22–5.81)
RDW: 13.4 % (ref 11.5–15.5)
WBC: 7.3 10*3/uL (ref 4.0–10.5)
nRBC: 0 % (ref 0.0–0.2)

## 2019-04-22 MED ORDER — ONDANSETRON HCL 4 MG PO TABS: 4.0000 mg | ORAL_TABLET | Freq: Four times a day (QID) | ORAL | Status: DC | PRN

## 2019-04-22 MED ORDER — ONDANSETRON HCL 4 MG/2ML IJ SOLN: 4.0000 mg | Freq: Four times a day (QID) | INTRAMUSCULAR | Status: DC | PRN

## 2019-04-22 MED ORDER — CARVEDILOL 25 MG PO TABS
25.0000 mg | ORAL_TABLET | Freq: Two times a day (BID) | ORAL | Status: DC
  Administered 2019-04-22 (×2): 25 mg via ORAL
  Filled 2019-04-22 (×2): qty 1

## 2019-04-22 MED ORDER — TRAMADOL HCL 50 MG PO TABS
50.0000 mg | ORAL_TABLET | Freq: Four times a day (QID) | ORAL | Status: DC | PRN
  Administered 2019-04-22: 50 mg via ORAL
  Filled 2019-04-22 (×2): qty 1

## 2019-04-22 MED ORDER — CHEWING GUM (ORBIT) SUGAR FREE
1.0000 | CHEWING_GUM | Freq: Four times a day (QID) | ORAL | Status: DC
  Administered 2019-04-22 – 2019-04-23 (×7): 1 via ORAL
  Filled 2019-04-22: qty 1

## 2019-04-22 NOTE — Progress Notes (Signed)
  [facility] Progress Note    Assessment/ Plan:   1. ESRD s/p DDKT: 07/15/2017.  Initially converted to IV and SL - mycophenolate 1000 mg IV q 12, and tac 2 mg sublingual-->dose adjustment for SL administration.  Now that he is taking PO we converted back to home regimen - Restarted prograf 4mg  bid, cellcept 1000mg  BID and prednisone 5mg  daily.  - Stable for d/c from renal standpoint; will sign off at this time.  2. SBO: being advanced to FLD. Per surgical recs -> advance and monitor if tolerates.  3. HTN: monitor, tolerating liquid diet.  Restarted home  Antihtn meds.  4. Dispo: inpt  Subjective:   Feels better. Denies f/c/n/v.   Objective:   BP (!) 156/96 (BP Location: Left Arm)   Pulse 90   Temp 99.1 F (37.3 C) (Oral)   Resp 18   Ht 6' (1.829 m)   Wt 102.6 kg   SpO2 96%   BMI 30.66 kg/m   Intake/Output Summary (Last 24 hours) at 04/22/2019 0855 Last data filed at 04/22/2019 4081 Gross per 24 hour  Intake 2439.99 ml  Output 2700 ml  Net -260.01 ml   Weight change:   Physical Exam: Jon [name], ambulating NECK no JVD PULM clear bilaterally CV RRR loud S2 ABD soft,+ hypoactive BS EXT no LE edema  Imaging: No results found.  Labs: BMET Recent Labs  Lab 04/17/19 0935 04/18/19 0543 04/19/19 0609 04/20/19 0646 04/21/19 0555 04/22/19 0800  NA 139 139 139 140 136 134*  K 4.1 4.5 4.6 3.7 3.6 3.5  CL 108 105 106 109 108 104  CO2 21* 21* 21* 21* 22 17*  GLUCOSE 160* 121* 120* 99 103* 103*  BUN 15 15 20 19 14 13   CREATININE 1.59* 1.57* 1.84* 1.45* 1.38* 1.47*  CALCIUM 9.8 9.6 9.8 9.1 8.3* 9.0   CBC Recent Labs  Lab 04/19/19 0558 04/20/19 0646 04/21/19 0555 04/22/19 0800  WBC 11.8* 10.4 5.9 7.3  HGB 15.6 14.9 13.4 15.6  HCT 49.4 46.4 42.7 47.9  MCV 87.9 87.1 87.1 85.4  PLT 134* 131* 108* 144*    Medications:    . amLODipine  10 mg Oral Daily  . carvedilol  25 mg Oral BID  . enoxaparin (LOVENOX) injection  40 mg  Subcutaneous Q24H  . hydrALAZINE  50 mg Oral TID  . mycophenolate  1,000 mg Oral BID  . pantoprazole  40 mg Oral Daily  . predniSONE  5 mg Oral Q breakfast  . tacrolimus  4 mg Oral BID      Otelia Santee, MD 04/22/2019, 8:55 AM

## 2019-04-22 NOTE — Plan of Care (Signed)
  Problem: Education: Goal: Knowledge of General Education information will improve Description: Including pain rating scale, medication(s)/side effects and non-pharmacologic comfort measures Outcome: Progressing   Problem: Nutrition: Goal: Adequate nutrition will be maintained Outcome: Progressing   

## 2019-04-22 NOTE — Final Consult Note (Signed)
Consultant Final Sign-Off Note    Assessment/Final recommendations  Jon [name] is a 63 y.o. male followed by me for sbo. Patient has clinically resolved with medical management. Tolerating soft diet and having bowel function.    Stable for discharge from a surgical standpoint.   Wound care (if applicable): None   Diet at discharge: soft diet    Activity at discharge: per primary team   Follow-up appointment:  PCP   Pending results:  Unresulted Labs (From admission, onward)    Start     Ordered   04/21/19 0500  CBC  Daily,   R     04/20/19 1834   04/21/19 2376  Basic metabolic panel  Daily,   R     04/20/19 1834           Medication recommendations: colace and miralax prn if constipated   Other recommendations: none     Thank you for allowing [place] to participate in the care of your patient!  Please consult [place] again if you have further needs for your patient.  Claiborne Billings [place] 04/22/2019 8:43 AM    Subjective   Patient advanced to soft diet yesterday evening. Some bloating occasionally but still having bowel function. Denies nausea or abdominal pain this AM.   Objective  Vital signs in last 24 hours: Temp:  [98.1 F (36.7 C)-99.1 F (37.3 C)] 99.1 F (37.3 C) (09/23 0837) Pulse Rate:  [64-90] 90 (09/23 0837) Resp:  [18] 18 (09/23 0458) BP: (141-184)/(75-148) 156/96 (09/23 0837) SpO2:  [94 %-97 %] 96 % (09/23 0837)  PE: Gen: Alert, NAD, pleasant Card: Regular rate and rhythm Pulm: Normal effort, clear to auscultation bilaterally Abd: Soft, non-tender,mildly distended, +BS Skin: warm and dry, no rashes  Psych: A&Ox3   Pertinent labs and Studies: Recent Labs    04/20/19 0646 04/21/19 0555 04/22/19 0800  WBC 10.4 5.9 7.3  HGB 14.9 13.4 15.6  HCT 46.4 42.7 47.9   BMET Recent Labs    04/20/19 0646 04/21/19 0555  NA 140 136  K 3.7 3.6  CL 109 108  CO2 21* 22  GLUCOSE 99 103*  BUN 19 14  CREATININE 1.45* 1.38*  CALCIUM 9.1 8.3*   No results  for input(s): LABURIN in the last 72 hours. Results for orders placed or performed during the hospital encounter of 04/17/19  SARS Coronavirus 2 [facility] order, Performed in [facility] hospital lab) Nasopharyngeal     Status: None   Collection Time: 04/17/19 12:09 PM   Specimen: Nasopharyngeal  Result Value Ref Range Status   SARS Coronavirus 2 NEGATIVE NEGATIVE Final    Comment: (NOTE) If result is NEGATIVE SARS-CoV-2 target nucleic acids are NOT DETECTED. The SARS-CoV-2 RNA is generally detectable in upper and lower  respiratory specimens during the acute phase of infection. The lowest  concentration of SARS-CoV-2 viral copies this assay can detect is 250  copies / mL. A negative result does not preclude SARS-CoV-2 infection  and should not be used as the sole basis for treatment or other  patient management decisions.  A negative result may occur with  improper specimen collection / handling, submission of specimen other  than nasopharyngeal swab, presence of viral mutation(s) within the  areas targeted by this assay, and inadequate number of viral copies  (<250 copies / mL). A negative result must be combined with clinical  observations, patient history, and epidemiological information. If result is POSITIVE SARS-CoV-2 target nucleic acids are DETECTED. The SARS-CoV-2 RNA is generally detectable  in upper and lower  respiratory specimens dur ing the acute phase of infection.  Positive  results are indicative of active infection with SARS-CoV-2.  Clinical  correlation with patient history and other diagnostic information is  necessary to determine patient infection status.  Positive results do  not rule out bacterial infection or co-infection with other viruses. If result is PRESUMPTIVE POSTIVE SARS-CoV-2 nucleic acids MAY BE PRESENT.   A presumptive positive result was obtained on the submitted specimen  and confirmed on repeat testing.  While 2019 novel coronavirus   (SARS-CoV-2) nucleic acids may be present in the submitted sample  additional confirmatory testing may be necessary for epidemiological  and / or clinical management purposes  to differentiate between  SARS-CoV-2 and other Sarbecovirus currently known to infect humans.  If clinically indicated additional testing with an alternate test  methodology [phone]) is advised. The SARS-CoV-2 RNA is generally  detectable in upper and lower respiratory sp ecimens during the acute  phase of infection. The expected result is Negative. Fact Sheet for Patients:  StrictlyIdeas.no Fact Sheet for Healthcare Providers: BankingDealers.co.za This test is not yet approved or cleared by the [place] FDA and has been authorized for detection and/or diagnosis of SARS-CoV-2 by FDA under an Emergency Use Authorization (EUA).  This EUA will remain in effect (meaning this test can be used) for the duration of the COVID-19 declaration under Section 564(b)(1) of the Act, 21 U.S.C. section 360bbb-3(b)(1), unless the authorization is terminated or revoked sooner. Performed at [facility], [place] [address], [place], [place] [address]     Imaging: No results found.

## 2019-04-22 NOTE — Discharge Instructions (Signed)
Bowel Obstruction °A bowel obstruction means that something is blocking the small or large bowel. The bowel is also called the intestine. It is the long tube that connects the stomach to the opening of the butt (anus). When something blocks the bowel, food and fluids cannot pass through like normal. This condition needs to be treated. Treatment depends on the cause of the problem and how bad the problem is. °What are the causes? °Common causes of this condition include: °· Scar tissue (adhesions) from past surgery or from high-energy X-rays (radiation). °· Recent surgery in the belly. This affects how food moves in the bowel. °· Some diseases, such as: °? Irritation of the lining of the digestive tract (Crohn's disease). °? Irritation of small pouches in the bowel (diverticulitis). °· Growths or tumors. °· A bulging organ (hernia). °· Twisting of the bowel (volvulus). °· A foreign body. °· Slipping of a part of the bowel into another part (intussusception). °What are the signs or symptoms? °Symptoms of this condition include: °· Pain in the belly. °· Feeling sick to your stomach (nauseous). °· Throwing up (vomiting). °· Bloating in the belly. °· Being unable to pass gas. °· Trouble pooping (constipation). °· Watery poop (diarrhea). °· A lot of belching. °How is this diagnosed? °This condition may be diagnosed based on: °· A physical exam. °· Medical history. °· Imaging tests, such as X-ray or CT scan. °· Blood tests. °· Urine tests. °How is this treated? °Treatment for this condition may include: °· Fluids and pain medicines that are given through an IV tube. Your doctor may tell you not to eat or drink if you feel sick to your stomach and are throwing up. °· Eating a clear liquid diet for a few days. °· Putting a small tube (nasogastric tube) into the stomach. This will help with pain, discomfort, and nausea by removing blocked air and fluids from the stomach. °· Surgery. This may be needed if other treatments do  not work. °Follow these instructions at home: °Medicines °· Take over-the-counter and prescription medicines only as told by your doctor. °· If you were prescribed an antibiotic medicine, take it as told by your doctor. Do not stop taking the antibiotic even if you start to feel better. °General instructions °· Follow your diet as told by your doctor. You may need to: °? Only drink clear liquids until you start to get better. °? Avoid solid foods. °· Return to your normal activities as told by your doctor. Ask your doctor what activities are safe for you. °· Do not sit for a long time without moving. Get up to take short walks every 1-2 hours. This is important. Ask for help if you feel weak or unsteady. °· Keep all follow-up visits as told by your doctor. This is important. °How is this prevented? °After having a bowel obstruction, you may be more likely to have another. You can do some things to stop it from happening again. °· If you have a long-term (chronic) disease, contact your doctor if you see changes or problems. °· Take steps to prevent or treat trouble pooping. Your doctor may ask that you: °? Drink enough fluid to keep your pee (urine) pale yellow. °? Take over-the-counter or prescription medicines. °? Eat foods that are high in fiber. These include beans, whole grains, and fresh fruits and vegetables. °? Limit foods that are high in fat and sugar. These include fried or sweet foods. °· Stay active. Ask your doctor which exercises are   safe for you.  Avoid stress.  Eat three small meals and three small snacks each day.  Work with a Publishing rights manager (dietitian) to make a meal plan that works for you.  Do not use any products that contain nicotine or tobacco, such as cigarettes and e-cigarettes. If you need help quitting, ask your doctor. Contact a doctor if:  You have a fever.  You have chills. Get help right away if:  You have pain or cramps that get worse.  You throw up blood.  You are  sick to your stomach.  You cannot stop throwing up.  You cannot drink fluids.  You feel mixed up (confused).  You feel very thirsty (dehydrated).  Your belly gets more bloated.  You feel weak or you pass out (faint). Summary  A bowel obstruction means that something is blocking the small or large bowel.  Treatment may include IV fluids and pain medicine. You may also have a clear liquid diet, a small tube in your stomach, or surgery.  Drink clear liquids and avoid solid foods until you get better. This information is not intended to replace advice given to you by your health care provider. Make sure you discuss any questions you have with your health care provider. Document Released: 08/23/2004 Document Revised: 11/27/2017 Document Reviewed: 11/27/2017 Elsevier Patient Education  2020 [employer] A soft-food eating plan includes foods that are safe and easy to chew and swallow. Your health care provider or dietitian can help you find foods and flavors that fit into this plan. Follow this plan until your health care provider or dietitian says it is safe to start eating other foods and food textures. What are tips for following this plan? General guidelines   Take small bites of food, or cut food into pieces about  inch or smaller. Bite-sized pieces of food are easier to chew and swallow.  Eat moist foods. Avoid overly dry foods.  Avoid foods that: ? Are difficult to swallow, such as dry, chunky, crispy, or sticky foods. ? Are difficult to chew, such as hard, tough, or stringy foods. ? Contain nuts, seeds, or fruits.  Follow instructions from your dietitian about the types of liquids that are safe for you to swallow. You may be allowed to have: ? Thick liquids only. This includes only liquids that are thicker than honey. ? Thin and thick liquids. This includes all beverages and foods that become liquid at room temperature.  To make thick  liquids: ? Purchase a commercial liquid thickening powder. These are available at grocery stores and pharmacies. ? Mix the thickener into liquids according to instructions on the label. ? Purchase ready-made thickened liquids. ? Thicken soup by pureeing, straining to remove chunks, and adding flour, potato flakes, or corn starch. ? Add commercial thickener to foods that become liquid at room temperature, such as milk shakes, yogurt, ice cream, gelatin, and sherbet.  Ask your health care provider whether you need to take a fiber supplement. Cooking  Cook meats so they stay tender and moist. Use methods like braising, stewing, or baking in liquid.  Cook vegetables and fruit until they are soft enough to be mashed with a fork.  Peel soft, fresh fruits such as peaches, nectarines, and melons.  When making soup, make sure chunks of meat and vegetables are smaller than  inch.  Reheat leftover foods slowly so that a tough crust does not form. What foods are allowed? The items listed below may  not be a complete list. Talk with your dietitian about what dietary choices are best for you. Grains Breads, muffins, pancakes, or waffles moistened with syrup, jelly, or butter. Dry cereals well-moistened with milk. Moist, cooked cereals. Well-cooked pasta and rice. Vegetables All soft-cooked vegetables. Shredded lettuce. Fruits All canned and cooked fruits. Soft, peeled fresh fruits. Strawberries. Dairy Milk. Cream. Yogurt. Cottage cheese. Soft cheese without the rind. Meats and other protein foods Tender, moist ground meat, poultry, or fish. Meat cooked in gravy or sauces. Eggs. Sweets and desserts Ice cream. Milk shakes. Sherbet. Pudding. Fats and oils Butter. Margarine. Olive, canola, sunflower, and grapeseed oil. Smooth salad dressing. Smooth cream cheese. Mayonnaise. Gravy. What foods are not allowed? The items listed bemay not be a complete list. Talk with your dietitian about what dietary  choices are best for you. Grains Coarse or dry cereals, such as bran, granola, and shredded wheat. Tough or chewy crusty breads, such as [place] bread or baguettes. Breads with nuts, seeds, or fruit. Vegetables All raw vegetables. Cooked corn. Cooked vegetables that are tough or stringy. Tough, crisp, fried potatoes and potato skins. Fruits Fresh fruits with skins or seeds, or both, such as apples, pears, and grapes. Stringy, high-pulp fruits, such as papaya, pineapple, coconut, and mango. Fruit leather and all dried fruit. Dairy Yogurt with nuts or coconut. Meats and other protein foods Hard, dry sausages. Dry meat, poultry, or fish. Meats with gristle. Fish with bones. Fried meat or fish. Lunch meat and hotdogs. Nuts and seeds. Chunky peanut butter or other nut butters. Sweets and desserts Cakes or cookies that are very dry or chewy. Desserts with dried fruit, nuts, or coconut. Fried pastries. Very rich pastries. Fats and oils Cream cheese with fruit or nuts. Salad dressings with seeds or chunks. Summary  A soft-food eating plan includes foods that are safe and easy to swallow. Generally, the foods should be soft enough to be mashed with a fork.  Avoid foods that are dry, hard to chew, crunchy, sticky, stringy, or crispy.  Ask your health care provider whether you need to thicken your liquids and if you need to take a fiber supplement. This information is not intended to replace advice given to you by your health care provider. Make sure you discuss any questions you have with your health care provider. Document Released: 10/23/2007 Document Revised: 11/06/2018 Document Reviewed: 09/18/2016 Elsevier Patient Education  2020 [employer].

## 2019-04-22 NOTE — Progress Notes (Addendum)
Notified family teaching via [place] text that pt continues to c/o abdominal pain 5/10, states he has to vomit but can't, and states it hurts after he eats. Abd is distended and tender but soft.  MD Chauncey Reading called back and stated that she had put in orders for a Abd X-ray.  Paulla Fore, RN

## 2019-04-22 NOTE — Care Management Important Message (Signed)
Important Message  Patient Details  Name: Jon [name] MRN: [id number] Date of Birth: Oct 01, 1955   Medicare Important Message Given:  Yes     Memory Argue 04/22/2019, 2:51 PM

## 2019-04-23 ENCOUNTER — Inpatient Hospital Stay (HOSPITAL_COMMUNITY): Payer: Medicare Other

## 2019-04-23 LAB — CBC
HCT: 44.5 % (ref 39.0–52.0)
Hemoglobin: 14.6 g/dL (ref 13.0–17.0)
MCH: 27.9 pg (ref 26.0–34.0)
MCHC: 32.8 g/dL (ref 30.0–36.0)
MCV: 84.9 fL (ref 80.0–100.0)
Platelets: 132 10*3/uL — ABNORMAL LOW (ref 150–400)
RBC: 5.24 MIL/uL (ref 4.22–5.81)
RDW: 13.5 % (ref 11.5–15.5)
WBC: 6.5 10*3/uL (ref 4.0–10.5)
nRBC: 0 % (ref 0.0–0.2)

## 2019-04-23 LAB — BASIC METABOLIC PANEL
Anion gap: 11 (ref 5–15)
BUN: 13 mg/dL (ref 6–20)
CO2: 20 mmol/L — ABNORMAL LOW (ref 22–32)
Calcium: 9 mg/dL (ref 8.9–10.3)
Chloride: 104 mmol/L (ref 98–111)
Creatinine, Ser: 1.41 mg/dL — ABNORMAL HIGH (ref 0.61–1.24)
GFR calc Af Amer: >60 mL/min (ref >60)
GFR calc non Af Amer: 54 mL/min — ABNORMAL LOW (ref >60)
Glucose, Bld: 94 mg/dL (ref 70–99)
Potassium: 3.2 mmol/L — ABNORMAL LOW (ref 3.5–5.1)
Sodium: 135 mmol/L (ref 135–145)

## 2019-04-23 MED ORDER — HYDROMORPHONE HCL 1 MG/ML IJ SOLN
1.0000 mg | INTRAMUSCULAR | Status: DC | PRN
  Administered 2019-04-23: 1 mg via INTRAVENOUS
  Filled 2019-04-23: qty 1

## 2019-04-23 MED ORDER — TACROLIMUS 1 MG PO CAPS
2.0000 mg | ORAL_CAPSULE | Freq: Two times a day (BID) | ORAL | Status: DC
  Administered 2019-04-23 (×2): 2 mg via SUBLINGUAL
  Filled 2019-04-23 (×2): qty 2

## 2019-04-23 MED ORDER — MYCOPHENOLATE MOFETIL HCL 500 MG IV SOLR
1000.0000 mg | Freq: Two times a day (BID) | INTRAVENOUS | Status: DC
  Administered 2019-04-23 (×2): 1000 mg via INTRAVENOUS
  Filled 2019-04-23 (×5): qty 30

## 2019-04-23 MED ORDER — DEXAMETHASONE SODIUM PHOSPHATE 4 MG/ML IJ SOLN
1.5200 mg | INTRAMUSCULAR | Status: DC
  Administered 2019-04-23: 1.52 mg via INTRAVENOUS
  Filled 2019-04-23 (×2): qty 0.38

## 2019-04-23 MED ORDER — POTASSIUM CHLORIDE 10 MEQ/100ML IV SOLN
10.0000 meq | INTRAVENOUS | Status: AC
  Administered 2019-04-23 (×4): 10 meq via INTRAVENOUS
  Filled 2019-04-23 (×3): qty 100

## 2019-04-23 MED ORDER — PANTOPRAZOLE SODIUM 40 MG IV SOLR
40.0000 mg | Freq: Every day | INTRAVENOUS | Status: DC
  Administered 2019-04-23: 40 mg via INTRAVENOUS
  Filled 2019-04-23: qty 40

## 2019-04-23 MED ORDER — TRAMADOL HCL 50 MG PO TABS: 50.0000 mg | ORAL_TABLET | Freq: Four times a day (QID) | ORAL | Status: DC | PRN

## 2019-04-23 MED ORDER — METOPROLOL TARTRATE 5 MG/5ML IV SOLN
2.5000 mg | Freq: Three times a day (TID) | INTRAVENOUS | Status: DC
  Administered 2019-04-23 – 2019-04-24 (×4): 2.5 mg via INTRAVENOUS
  Filled 2019-04-23 (×4): qty 5

## 2019-04-23 NOTE — Plan of Care (Signed)
  Problem: Education: Goal: Knowledge of General Education information will improve Description: Including pain rating scale, medication(s)/side effects and non-pharmacologic comfort measures Outcome: Progressing   Problem: Nutrition: Goal: Adequate nutrition will be maintained 04/23/2019 1223 by Babs Sciara, RN Outcome: Not Progressing 04/23/2019 1222 by Babs Sciara, RN Outcome: Progressing

## 2019-04-23 NOTE — Progress Notes (Signed)
FPTS Interim Progress Note  Patient evaluated this afternoon in regards to abdominal pain.  He notes the pain is off and on in the center but no worse.  He does endorse a bowel movement yesterday and he continues to pass flatus today.  He feels comfortable with removal of the NG tube and trying a clear liquid diet.  He notes the tramadol has managed his pain well. - We will remove NG tube - Start clear liquid diet, plan to advance as tolerated - Transition from IV Dilaudid to PO Tramadol - Plan to transition all other meds to PO maybe tomorrow pending toleration of diet - continue to assess bowel movements/flatus and adjust plan as needed  Danna Hefty, DO 04/23/2019, 1:32 PM PGY-2, [place] Medicine Service pager [phone]

## 2019-04-23 NOTE — Progress Notes (Signed)
Family Medicine Teaching Service Daily Progress Note Intern Pager: [phone]  Patient name: Jon [name] Medical record number: [id number] Date of birth: [birth year] Age: 63 y.o. Gender: male  Primary Care Provider: Guadalupe Dawn, MD Consultants: General Surgery Code Status: FULL Emergency contact: [place] Bing, brother, at [phone]  Pt Overview and Major Events to Date:  9/18 admit to FPTS, CTA: fluid-filled dilated small bowel loops in L mid to upper abdomen; NG tube placed 9/19: SBO protocol 9/21: Diet advanced clears, NG tube removed   9/22: Diet advanced fulls 9/23: Diet advanced regular diet  Assessment and Plan: Jon [name] is a 63 y.o. male presented with abdominal pain and was found to have an SBO. PMH is significant for CKD s/p renal transplant in 06/2017, CAD, hypertension, and hyperlipidemia.  Small bowel obstruction: Acute, improving. Patient reports hiccups are improving however continues to have persistent  abdominal pain.  On exam, non tender to palpation and normal bowel sounds.  Remove NG tube placed last evening.  Repeat KUB continues to show dilated bowel loops concerning for SBO however patient reporting flatus and bowel movements.  Patient to have clear diet and advance as tolerated.  Surgery consulted and will not follow patient. - General surgery signed off - Remove NG tube  - Advance diet as tolerated - Tramadol as needed   AKI, resolved  Baseline 1.5-1.6.  --Continue to trend Cr  --Daily BMP   CAD with previous MI in 2006: Chronic, stable. - Continue ASA and Coreg 25 mg twice daily -- patient on Atorvastatin 20mg  daily. Given h/o MI can consider transitioning to high intensity statin when tolerating PO   Hypertension: Chronic, uncontrolled. Hypertensive.  Continue to follow blood pressures. Home meds include: Coreg 25mg  BID, Hydralazine 50mg  TID, and Amlodipine 10mg  QD.   -Hold home medications as patient is NPO  Hyperlipidemia: Stable. - Hold  home atorvastatin 20 mg -- transition to high intensity on discharge  S/p Right Kidney Transplant:  Patient is s/p right kidney transplant in December 2018 after being ESRD from 2006-2018. He is currently on immunosuppressants. Home medications include: Cellcept, NaBicarb powder, Tacrolimus 4mg  BID, Prednisone 5mg  QD, MagOx 800 BID, and P-Na-K supplementations. - nephrology signed off, appreciate recommendations -Continue Prograf 4 mg BID, Cellcept 1000 mg BID, Prednisone 5 mg daily   Pulmonary Nodules: Several small pulmonary nodules over right middle and lower lobe with largest measuring 59mm with recommendation for further evaluation with non-contrast chest CT. Denies cough or hemoptysis.  - recommend outpatient chest CT  FEN/GI: Regular, IV Protonix BID PPx: Lovenox  Disposition: Home possibly tomorrow  Subjective:  Patient reports abdominal pain with eating.  Had a Repeat KUB and NGT Placed.    Objective: Temp:  [98.5 F (36.9 C)-98.9 F (37.2 C)] 98.6 F (37 C) (09/24 0950) Pulse Rate:  [64-75] 75 (09/24 0950) Resp:  [16-20] 20 (09/24 0950) BP: (144-154)/(75-99) 144/83 (09/24 0950) SpO2:  [92 %-96 %] 93 % (09/24 0950) Weight:  [102 kg] 102 kg (09/23 2053)  Physical Exam: GEN: Lying in bed watching television, comfortable appearing, in no acute distress CV: regular rate and rhythm, no murmurs appreciated  RESP: no increased work of breathing, clear to ascultation bilaterally ABD: Bowel sounds present, soft, nontender in all quadrants, no guarding, no rebound, mildly distended MSK: no lower extremity edema SKIN: warm, dry NEURO: grossly normal, moves all extremities appropriately     Laboratory: Recent Labs  Lab 04/21/19 0555 04/22/19 0800 04/23/19 0537  WBC 5.9 7.3 6.5  HGB 13.4  15.6 14.6  HCT 42.7 47.9 44.5  PLT 108* 144* 132*   Recent Labs  Lab 04/17/19 0935  04/21/19 0555 04/22/19 0800 04/23/19 0537  NA 139   < > 136 134* 135  K 4.1   < > 3.6 3.5  3.2*  CL 108   < > 108 104 104  CO2 21*   < > 22 17* 20*  BUN 15   < > 14 13 13   CREATININE 1.59*   < > 1.38* 1.47* 1.41*  CALCIUM 9.8   < > 8.3* 9.0 9.0  PROT 7.6  --   --   --   --   BILITOT 1.0  --   --   --   --   ALKPHOS 85  --   --   --   --   ALT 29  --   --   --   --   AST 28  --   --   --   --   GLUCOSE 160*   < > 103* 103* 94   < > = values in this interval not displayed.     Imaging/Diagnostic Tests:  Dg Abd 1 View  Result Date: 04/22/2019 CLINICAL DATA:  Small bowel obstruction. Worsening generalized abdominal pain. EXAM: ABDOMEN - 1 VIEW COMPARISON:  Radiograph 04/20/2019. CT 04/17/2019 FINDINGS: The enteric tube is been removed. There is gaseous gastric distension. Persistent gaseous small bowel distension with slight progression in the right abdomen from prior exam. Enteric sutures noted in the right mid abdomen and central pelvis. No evidence of free air. IMPRESSION: Persistent radiographic findings of small bowel obstruction. Increased gaseous gastric distension and small bowel dilatation in the right abdomen after enteric tube has been removed. Electronically Signed   By: Keith Rake M.D.   On: 04/22/2019 20:41   Dg Abd Portable 1v  Result Date: 04/22/2019 CLINICAL DATA:  NG tube placement EXAM: PORTABLE ABDOMEN - 1 VIEW COMPARISON:  04/22/2019 FINDINGS: NG tube is in place with the tip in the proximal stomach with the side port near the GE junction. Gaseous distention of stomach and small bowel again noted, unchanged. IMPRESSION: NG tube tip in the proximal stomach with the side port at the GE junction. Electronically Signed   By: Rolm Baptise M.D.   On: 04/22/2019 23:47     Lyndee Hensen, MD 04/23/2019, 10:23 AM PGY-1, [place] Intern pager: [phone], text pages welcome

## 2019-04-24 LAB — CBC
HCT: 48.8 % (ref 39.0–52.0)
Hemoglobin: 15.8 g/dL (ref 13.0–17.0)
MCH: 27.5 pg (ref 26.0–34.0)
MCHC: 32.4 g/dL (ref 30.0–36.0)
MCV: 85 fL (ref 80.0–100.0)
Platelets: 164 10*3/uL (ref 150–400)
RBC: 5.74 MIL/uL (ref 4.22–5.81)
RDW: 13.5 % (ref 11.5–15.5)
WBC: 9.3 10*3/uL (ref 4.0–10.5)
nRBC: 0 % (ref 0.0–0.2)

## 2019-04-24 LAB — RENAL FUNCTION PANEL
Albumin: 3.9 g/dL (ref 3.5–5.0)
Anion gap: 13 (ref 5–15)
BUN: 16 mg/dL (ref 6–20)
CO2: 18 mmol/L — ABNORMAL LOW (ref 22–32)
Calcium: 9.8 mg/dL (ref 8.9–10.3)
Chloride: 103 mmol/L (ref 98–111)
Creatinine, Ser: 1.53 mg/dL — ABNORMAL HIGH (ref 0.61–1.24)
GFR calc Af Amer: 56 mL/min — ABNORMAL LOW (ref >60)
GFR calc non Af Amer: 49 mL/min — ABNORMAL LOW (ref >60)
Glucose, Bld: 99 mg/dL (ref 70–99)
Phosphorus: 2.5 mg/dL (ref 2.5–4.6)
Potassium: 3.6 mmol/L (ref 3.5–5.1)
Sodium: 134 mmol/L — ABNORMAL LOW (ref 135–145)

## 2019-04-24 MED ORDER — MYCOPHENOLATE MOFETIL 250 MG PO CAPS: 1000.0000 mg | ORAL_CAPSULE | Freq: Two times a day (BID) | ORAL | Status: DC

## 2019-04-24 MED ORDER — AMLODIPINE BESYLATE 10 MG PO TABS
10.0000 mg | ORAL_TABLET | Freq: Every day | ORAL | Status: DC
  Administered 2019-04-24: 10 mg via ORAL
  Filled 2019-04-24: qty 1

## 2019-04-24 MED ORDER — ATORVASTATIN CALCIUM 40 MG PO TABS: 40.0000 mg | ORAL_TABLET | Freq: Every day | ORAL | 2 refills | Status: DC

## 2019-04-24 MED ORDER — CARVEDILOL 25 MG PO TABS: 25.0000 mg | ORAL_TABLET | Freq: Two times a day (BID) | ORAL | 0 refills | Status: DC

## 2019-04-24 MED ORDER — PREDNISONE 5 MG PO TABS
5.0000 mg | ORAL_TABLET | Freq: Every day | ORAL | Status: DC
  Administered 2019-04-24: 5 mg via ORAL
  Filled 2019-04-24: qty 1

## 2019-04-24 MED ORDER — TRAMADOL HCL 50 MG PO TABS: 50.0000 mg | ORAL_TABLET | Freq: Four times a day (QID) | ORAL | 0 refills | Status: DC | PRN

## 2019-04-24 MED ORDER — CARVEDILOL 25 MG PO TABS
25.0000 mg | ORAL_TABLET | Freq: Two times a day (BID) | ORAL | Status: DC
  Administered 2019-04-24: 25 mg via ORAL
  Filled 2019-04-24: qty 1

## 2019-04-24 MED ORDER — PANTOPRAZOLE SODIUM 40 MG PO TBEC
40.0000 mg | DELAYED_RELEASE_TABLET | Freq: Every day | ORAL | Status: DC
  Administered 2019-04-24: 40 mg via ORAL
  Filled 2019-04-24: qty 1

## 2019-04-24 MED ORDER — HYDRALAZINE HCL 50 MG PO TABS
50.0000 mg | ORAL_TABLET | Freq: Three times a day (TID) | ORAL | Status: DC
  Administered 2019-04-24 (×2): 50 mg via ORAL
  Filled 2019-04-24 (×2): qty 1

## 2019-04-24 MED ORDER — TACROLIMUS 1 MG PO CAPS
4.0000 mg | ORAL_CAPSULE | Freq: Two times a day (BID) | ORAL | Status: DC
  Administered 2019-04-24: 4 mg via ORAL
  Filled 2019-04-24: qty 4

## 2019-04-24 NOTE — Plan of Care (Signed)
DISCHARGE NOTE HOME Jon [name] to be discharged home per MD order. Discussed prescriptions and follow up appointments with the patient. Prescriptions given to patient; medication list explained in detail. Patient verbalized understanding.  Skin clean, dry and intact without evidence of skin break down, no evidence of skin tears noted. IV catheter discontinued intact. Site without signs and symptoms of complications. Dressing and pressure applied. Pt denies pain at the site currently. No complaints noted.  Patient free of lines, drains, and wounds.   An After Visit Summary (AVS) was printed and given to the patient. Patient escorted via wheelchair, and discharged home via private auto.  Stephan Minister, RN

## 2019-04-24 NOTE — Progress Notes (Signed)
Family Medicine Teaching Service Attending Brief Progress Note  S: Patient seen and examined. Patient reports he's doing well. Tolerated full liquids. Had bowel movement.  O: BP 133/85 (BP Location: Right Arm)   Pulse 74   Temp 98.4 F (36.9 C) (Oral)   Resp 18   Ht 6' (1.829 m)   Wt 99.4 kg   SpO2 96%   BMI 29.72 kg/m   Gen: no acute distress, pleasant, cooperative Abdomen: normoactive bowel sounds, soft, nontender to palpation, nondistended  A/P:  SBO - resolved, tolerating PO, had bowel movement. Stable for discharge.   Will cosign resident note when it is available.  Chrisandra Netters, MD [place]

## 2019-04-29 ENCOUNTER — Inpatient Hospital Stay: Payer: Medicare Other | Admitting: Family Medicine

## 2019-05-01 ENCOUNTER — Ambulatory Visit (INDEPENDENT_AMBULATORY_CARE_PROVIDER_SITE_OTHER): Payer: Medicare Other | Admitting: Family Medicine

## 2019-05-01 ENCOUNTER — Other Ambulatory Visit: Payer: Self-pay

## 2019-05-01 ENCOUNTER — Encounter: Payer: Self-pay | Admitting: Family Medicine

## 2019-05-01 VITALS — BP 158/80 | HR 66 | Wt 228.0 lb

## 2019-05-01 DIAGNOSIS — I10 Essential (primary) hypertension: Secondary | ICD-10-CM | POA: Diagnosis not present

## 2019-05-01 DIAGNOSIS — R911 Solitary pulmonary nodule: Secondary | ICD-10-CM | POA: Diagnosis present

## 2019-05-01 DIAGNOSIS — R918 Other nonspecific abnormal finding of lung field: Secondary | ICD-10-CM | POA: Diagnosis not present

## 2019-05-01 DIAGNOSIS — K56609 Unspecified intestinal obstruction, unspecified as to partial versus complete obstruction: Secondary | ICD-10-CM

## 2019-05-01 NOTE — Patient Instructions (Addendum)
Your CT scan is at October 9th at 4 PM.   It was wonderful to see you today.  Thank you for choosing [facility].   Please call [phone] with any questions about today's appointment.  Please be sure to schedule follow up at the front  desk before you leave today.   Dorris Singh, MD  Family Medicine    Today we talked about your recent hospital stay.  I have ordered a kidney function check.  I will call you with this result.  You had a small nodule on your CAT scan.  I recommend a specific imaging study to look at this better.  This test has been scheduled as above.  Please follow-up in about 3 months with your PCP

## 2019-05-01 NOTE — Assessment & Plan Note (Signed)
The patient takes amlodipine 10 mg, carvedilol 25 mg twice daily hydralazine 50 mg twice daily.  He is taking none of these this morning as he did not take his medications before coming to the doctor.  I recommended that in future he can certainly take his blood pressure pills before coming to his physician's office.  He is to monitor his blood pressure at home and call if it is persistently greater than 150/90.  Goal given his renal disease would be at least less than 140/90.

## 2019-05-01 NOTE — Assessment & Plan Note (Signed)
3 separate pulmonary nodules identified on CT abdomen pelvis.  These ranged between 4 and 6 mm.  The patient is a never smoker.  He is on chronic immunosuppression for his renal disease.  He denies any dyspnea, cough, hemoptysis.  Recommended CT imaging.  This was ordered today.

## 2019-05-01 NOTE — Progress Notes (Signed)
  Patient Name: Jon [name] Date of Birth: Dec 15, 1955 Date of Visit: 05/01/19 PCP: Guadalupe Dawn, MD  Chief Complaint: Hospital follow-up for bowel obstruction  Subjective: Jon [name] is a pleasant 63 y.o. with medical history significant for chronic kidney disease status post renal transplant, diverticulitis and coronary artery disease presenting today for checkup after hospitalization for small bowel obstruction.  Jon [name] was admitted from September 18-25 for a small bowel obstruction.  He is evaluated by surgery.  This is managed nonoperatively with NG tube placement.  He reports overall he feels well.  He is eating and drinking normally.  He is having normal brown bowel movements.  He denies constipation.  He denies abdominal pain.  He has a history of multiple abdominal surgeries including for his kidney transplant which is in his right lower quadrant.  Patient had an incidental finding of a pulmonary nodule.  He reports he is a never smoker.  He does not use illicit substances.  He denies shortness of breath, hemoptysis or recent exposure to tuberculosis.  Jon [name] reports that he has not yet taken any of his medications this morning as he was uncertain if we needed to check his labs including his immunosuppressive levels.  He denies chest pain, dyspnea or changes in vision.  Jon [name] declines a flu shot today as he is worried that he may get the infection.  Reviewed outside records hepatitis C antibody have been obtained multiple times at [facility] and was negative  ROS: Per HPI.   I have reviewed the patient's medical, surgical, family, and social history as appropriate.  Vitals:   05/01/19 0825  BP: (!) 158/80  Pulse: 66  SpO2: 99%  HEENT: Sclera anicteric. Dentition is moderate. Appears well hydrated. Cardiac: Regular rate and rhythm. Normal S1/S2. No murmurs, rubs, or gallops appreciated. Lungs: Clear bilaterally to ascultation.  Abdomen: Normoactive bowel sounds.   Multiple prior surgical scars.  Palpable kidney in right lower quadrant.  Minimal pain over right kidney.  No tenderness to deep or light palpation. No rebound or guarding.  Extremities: Warm, well perfused without edema.  Skin: Warm, dry Psych: Pleasant and appropriate    Hep C he was negative on 17 December and 2018. HTN (hypertension) The patient takes amlodipine 10 mg, carvedilol 25 mg twice daily hydralazine 50 mg twice daily.  He is taking none of these this morning as he did not take his medications before coming to the doctor.  I recommended that in future he can certainly take his blood pressure pills before coming to his physician's office.  He is to monitor his blood pressure at home and call if it is persistently greater than 150/90.  Goal given his renal disease would be at least less than 140/90.  Pulmonary nodule 3 separate pulmonary nodules identified on CT abdomen pelvis.  These ranged between 4 and 6 mm.  The patient is a never smoker.  He is on chronic immunosuppression for his renal disease.  He denies any dyspnea, cough, hemoptysis.  Recommended CT imaging.  This was ordered today.  Small bowel obstruction resolved he is improved return precautions given.  He should have his colonoscopy at the appropriate interval.  This is due in  Return to care in 3 months.   Dorris Singh, MD  Family Medicine Teaching Service

## 2019-05-02 LAB — BASIC METABOLIC PANEL
BUN/Creatinine Ratio: 6 — ABNORMAL LOW (ref 10–24)
BUN: 8 mg/dL (ref 8–27)
CO2: 19 mmol/L — ABNORMAL LOW (ref 20–29)
Calcium: 9.3 mg/dL (ref 8.6–10.2)
Chloride: 105 mmol/L (ref 96–106)
Creatinine, Ser: 1.25 mg/dL (ref 0.76–1.27)
GFR calc Af Amer: 72 mL/min/{1.73_m2} (ref >59)
GFR calc non Af Amer: 62 mL/min/{1.73_m2} (ref >59)
Glucose: 96 mg/dL (ref 65–99)
Potassium: 4.1 mmol/L (ref 3.5–5.2)
Sodium: 138 mmol/L (ref 134–144)

## 2019-05-04 ENCOUNTER — Telehealth: Payer: Self-pay | Admitting: Family Medicine

## 2019-05-04 NOTE — Telephone Encounter (Signed)
Called with normal labs.  Dorris Singh, MD  Family Medicine Teaching Service

## 2019-05-08 ENCOUNTER — Ambulatory Visit (HOSPITAL_COMMUNITY)
Admission: RE | Admit: 2019-05-08 | Discharge: 2019-05-08 | Disposition: A | Discharge status: Home / Self Care | Payer: Medicare Other | Source: Ambulatory Visit | Attending: Family Medicine | Admitting: Family Medicine

## 2019-05-08 ENCOUNTER — Other Ambulatory Visit: Payer: Self-pay

## 2019-05-08 DIAGNOSIS — R918 Other nonspecific abnormal finding of lung field: Secondary | ICD-10-CM | POA: Insufficient documentation

## 2019-05-11 ENCOUNTER — Telehealth: Payer: Self-pay | Admitting: Family Medicine

## 2019-05-11 NOTE — Telephone Encounter (Signed)
Called with results of CT scan, repeat again 3-6 months. Discussed with patient, placed in problem list.   Dorris Singh, MD  [facility] Medicine Teaching Service

## 2020-09-08 DIAGNOSIS — E139 Other specified diabetes mellitus without complications: Secondary | ICD-10-CM | POA: Insufficient documentation

## 2021-03-04 ENCOUNTER — Other Ambulatory Visit: Payer: Self-pay

## 2021-03-04 ENCOUNTER — Emergency Department (HOSPITAL_COMMUNITY): Payer: Medicare HMO

## 2021-03-04 ENCOUNTER — Observation Stay (HOSPITAL_COMMUNITY)
Admission: EM | Admit: 2021-03-04 | Discharge: 2021-03-07 | Disposition: A | Discharge status: Home / Self Care | Payer: Medicare HMO | Attending: Family Medicine | Admitting: Family Medicine

## 2021-03-04 ENCOUNTER — Encounter (HOSPITAL_COMMUNITY): Payer: Self-pay

## 2021-03-04 DIAGNOSIS — Z79899 Other long term (current) drug therapy: Secondary | ICD-10-CM | POA: Diagnosis not present

## 2021-03-04 DIAGNOSIS — I2510 Atherosclerotic heart disease of native coronary artery without angina pectoris: Secondary | ICD-10-CM | POA: Insufficient documentation

## 2021-03-04 DIAGNOSIS — U071 COVID-19: Secondary | ICD-10-CM | POA: Diagnosis not present

## 2021-03-04 DIAGNOSIS — Z940 Kidney transplant status: Secondary | ICD-10-CM

## 2021-03-04 DIAGNOSIS — I120 Hypertensive chronic kidney disease with stage 5 chronic kidney disease or end stage renal disease: Secondary | ICD-10-CM | POA: Insufficient documentation

## 2021-03-04 DIAGNOSIS — N179 Acute kidney failure, unspecified: Secondary | ICD-10-CM | POA: Diagnosis present

## 2021-03-04 DIAGNOSIS — Z7982 Long term (current) use of aspirin: Secondary | ICD-10-CM | POA: Diagnosis not present

## 2021-03-04 DIAGNOSIS — Z992 Dependence on renal dialysis: Secondary | ICD-10-CM | POA: Insufficient documentation

## 2021-03-04 DIAGNOSIS — I10 Essential (primary) hypertension: Secondary | ICD-10-CM | POA: Diagnosis present

## 2021-03-04 DIAGNOSIS — Z28310 Unvaccinated for covid-19: Secondary | ICD-10-CM | POA: Insufficient documentation

## 2021-03-04 DIAGNOSIS — N189 Chronic kidney disease, unspecified: Secondary | ICD-10-CM | POA: Diagnosis present

## 2021-03-04 DIAGNOSIS — N186 End stage renal disease: Secondary | ICD-10-CM | POA: Insufficient documentation

## 2021-03-04 DIAGNOSIS — R911 Solitary pulmonary nodule: Secondary | ICD-10-CM | POA: Diagnosis present

## 2021-03-04 DIAGNOSIS — R079 Chest pain, unspecified: Secondary | ICD-10-CM | POA: Diagnosis present

## 2021-03-04 DIAGNOSIS — R778 Other specified abnormalities of plasma proteins: Secondary | ICD-10-CM | POA: Diagnosis not present

## 2021-03-04 LAB — CBC
HCT: 41.2 % (ref 39.0–52.0)
Hemoglobin: 12.9 g/dL — ABNORMAL LOW (ref 13.0–17.0)
MCH: 26.9 pg (ref 26.0–34.0)
MCHC: 31.3 g/dL (ref 30.0–36.0)
MCV: 85.8 fL (ref 80.0–100.0)
Platelets: 114 10*3/uL — ABNORMAL LOW (ref 150–400)
RBC: 4.8 MIL/uL (ref 4.22–5.81)
RDW: 13.5 % (ref 11.5–15.5)
WBC: 4.1 10*3/uL (ref 4.0–10.5)
nRBC: 0 % (ref 0.0–0.2)

## 2021-03-04 LAB — TROPONIN I (HIGH SENSITIVITY)
Troponin I (High Sensitivity): 184 ng/L (ref <18)
Troponin I (High Sensitivity): 144 ng/L (ref <18)

## 2021-03-04 LAB — RESP PANEL BY RT-PCR (FLU A&B, COVID) ARPGX2
Influenza A by PCR: NEGATIVE
Influenza B by PCR: NEGATIVE
SARS Coronavirus 2 by RT PCR: POSITIVE — AB

## 2021-03-04 LAB — BASIC METABOLIC PANEL
Anion gap: 7 (ref 5–15)
BUN: 12 mg/dL (ref 8–23)
CO2: 24 mmol/L (ref 22–32)
Calcium: 8.6 mg/dL — ABNORMAL LOW (ref 8.9–10.3)
Chloride: 105 mmol/L (ref 98–111)
Creatinine, Ser: 1.93 mg/dL — ABNORMAL HIGH (ref 0.61–1.24)
GFR, Estimated: 39 mL/min — ABNORMAL LOW (ref >60)
Glucose, Bld: 153 mg/dL — ABNORMAL HIGH (ref 70–99)
Potassium: 3.6 mmol/L (ref 3.5–5.1)
Sodium: 136 mmol/L (ref 135–145)

## 2021-03-04 LAB — LIPASE, BLOOD: Lipase: 21 U/L (ref 11–51)

## 2021-03-04 MED ORDER — DIPHENHYDRAMINE HCL 50 MG/ML IJ SOLN: 50.0000 mg | Freq: Once | INTRAMUSCULAR | Status: AC | PRN

## 2021-03-04 MED ORDER — ALBUTEROL SULFATE HFA 108 (90 BASE) MCG/ACT IN AERS: 2.0000 | INHALATION_SPRAY | Freq: Once | RESPIRATORY_TRACT | Status: DC | PRN

## 2021-03-04 MED ORDER — SODIUM CHLORIDE 0.9 % IV SOLN: INTRAVENOUS | Status: DC | PRN

## 2021-03-04 MED ORDER — BEBTELOVIMAB 175 MG/2 ML IV (EUA)
175.0000 mg | Freq: Once | INTRAMUSCULAR | Status: AC
  Administered 2021-03-04: 175 mg via INTRAVENOUS
  Filled 2021-03-04: qty 2

## 2021-03-04 MED ORDER — ACETAMINOPHEN 325 MG PO TABS
650.0000 mg | ORAL_TABLET | Freq: Four times a day (QID) | ORAL | Status: DC | PRN
  Administered 2021-03-05: 650 mg via ORAL
  Filled 2021-03-04: qty 2

## 2021-03-04 MED ORDER — TRAMADOL HCL 50 MG PO TABS: 50.0000 mg | ORAL_TABLET | Freq: Four times a day (QID) | ORAL | Status: DC | PRN

## 2021-03-04 MED ORDER — EPINEPHRINE 0.3 MG/0.3ML IJ SOAJ: 0.3000 mg | Freq: Once | INTRAMUSCULAR | Status: DC | PRN

## 2021-03-04 MED ORDER — FAMOTIDINE IN NACL 20-0.9 MG/50ML-% IV SOLN: 20.0000 mg | Freq: Once | INTRAVENOUS | Status: AC | PRN

## 2021-03-04 MED ORDER — METHYLPREDNISOLONE SODIUM SUCC 125 MG IJ SOLR: 125.0000 mg | Freq: Once | INTRAMUSCULAR | Status: DC | PRN

## 2021-03-04 MED ORDER — EPINEPHRINE 0.3 MG/0.3ML IJ SOAJ
0.3000 mg | Freq: Once | INTRAMUSCULAR | Status: AC | PRN
Start: 2021-03-04 — End: 2021-03-05

## 2021-03-04 MED ORDER — IOHEXOL 350 MG/ML SOLN
65.0000 mL | Freq: Once | INTRAVENOUS | Status: AC | PRN
  Administered 2021-03-04: 65 mL via INTRAVENOUS

## 2021-03-04 MED ORDER — ONDANSETRON HCL 4 MG/2ML IJ SOLN: 4.0000 mg | Freq: Four times a day (QID) | INTRAMUSCULAR | Status: DC | PRN

## 2021-03-04 MED ORDER — HEPARIN SODIUM (PORCINE) 5000 UNIT/ML IJ SOLN
5000.0000 [IU] | Freq: Three times a day (TID) | INTRAMUSCULAR | Status: DC
  Administered 2021-03-04 – 2021-03-07 (×8): 5000 [IU] via SUBCUTANEOUS
  Filled 2021-03-04 (×7): qty 1

## 2021-03-04 MED ORDER — SODIUM BICARBONATE POWD: 5.0000 mL | Freq: Two times a day (BID) | Status: DC

## 2021-03-04 MED ORDER — ONDANSETRON HCL 4 MG PO TABS: 4.0000 mg | ORAL_TABLET | Freq: Four times a day (QID) | ORAL | Status: DC | PRN

## 2021-03-04 MED ORDER — DIPHENHYDRAMINE HCL 50 MG/ML IJ SOLN: 50.0000 mg | Freq: Once | INTRAMUSCULAR | Status: DC | PRN

## 2021-03-04 MED ORDER — BRIMONIDINE TARTRATE-TIMOLOL 0.2-0.5 % OP SOLN
1.0000 [drp] | Freq: Two times a day (BID) | OPHTHALMIC | Status: DC
  Filled 2021-03-04: qty 5

## 2021-03-04 MED ORDER — POTASSIUM & SODIUM PHOSPHATES 280-160-250 MG PO PACK
2.0000 | PACK | Freq: Three times a day (TID) | ORAL | Status: DC
  Administered 2021-03-04 – 2021-03-07 (×7): 2 via ORAL
  Filled 2021-03-04 (×10): qty 2

## 2021-03-04 MED ORDER — ALBUTEROL SULFATE HFA 108 (90 BASE) MCG/ACT IN AERS: 2.0000 | INHALATION_SPRAY | Freq: Once | RESPIRATORY_TRACT | Status: AC | PRN

## 2021-03-04 MED ORDER — SULFAMETHOXAZOLE-TRIMETHOPRIM 200-40 MG/5ML PO SUSP
10.0000 mL | ORAL | Status: DC
  Administered 2021-03-06: 10 mL via ORAL
  Filled 2021-03-04: qty 10

## 2021-03-04 MED ORDER — MYCOPHENOLATE 200 MG/ML ORAL SUSPENSION
1000.0000 mg | Freq: Two times a day (BID) | ORAL | Status: DC
  Administered 2021-03-04 – 2021-03-07 (×6): 1000 mg via ORAL
  Filled 2021-03-04 (×8): qty 20

## 2021-03-04 MED ORDER — NITROGLYCERIN 0.4 MG SL SUBL: 0.4000 mg | SUBLINGUAL_TABLET | SUBLINGUAL | Status: DC | PRN

## 2021-03-04 MED ORDER — ATORVASTATIN CALCIUM 10 MG PO TABS
20.0000 mg | ORAL_TABLET | Freq: Every day | ORAL | Status: DC
  Administered 2021-03-05 – 2021-03-07 (×3): 20 mg via ORAL
  Filled 2021-03-04 (×3): qty 2

## 2021-03-04 MED ORDER — TIMOLOL MALEATE 0.5 % OP SOLN
1.0000 [drp] | Freq: Two times a day (BID) | OPHTHALMIC | Status: DC
  Administered 2021-03-04 – 2021-03-07 (×5): 1 [drp] via OPHTHALMIC
  Filled 2021-03-04 (×3): qty 5

## 2021-03-04 MED ORDER — ALBUTEROL SULFATE (2.5 MG/3ML) 0.083% IN NEBU: 2.5000 mg | INHALATION_SOLUTION | Freq: Four times a day (QID) | RESPIRATORY_TRACT | Status: DC | PRN

## 2021-03-04 MED ORDER — PREDNISONE 5 MG/5ML PO SOLN: 5.0000 mg | Freq: Every day | ORAL | Status: DC

## 2021-03-04 MED ORDER — ASPIRIN EC 81 MG PO TBEC
81.0000 mg | DELAYED_RELEASE_TABLET | Freq: Every day | ORAL | Status: DC
  Administered 2021-03-05 – 2021-03-07 (×3): 81 mg via ORAL
  Filled 2021-03-04 (×3): qty 1

## 2021-03-04 MED ORDER — BEBTELOVIMAB 175 MG/2 ML IV (EUA): 175.0000 mg | Freq: Once | INTRAMUSCULAR | Status: DC

## 2021-03-04 MED ORDER — CARVEDILOL 25 MG PO TABS
25.0000 mg | ORAL_TABLET | Freq: Two times a day (BID) | ORAL | Status: DC
  Administered 2021-03-05 – 2021-03-07 (×5): 25 mg via ORAL
  Filled 2021-03-04: qty 2
  Filled 2021-03-04: qty 1
  Filled 2021-03-04: qty 2
  Filled 2021-03-04 (×2): qty 1

## 2021-03-04 MED ORDER — FAMOTIDINE IN NACL 20-0.9 MG/50ML-% IV SOLN: 20.0000 mg | Freq: Once | INTRAVENOUS | Status: DC | PRN

## 2021-03-04 MED ORDER — BRIMONIDINE TARTRATE 0.2 % OP SOLN
1.0000 [drp] | Freq: Two times a day (BID) | OPHTHALMIC | Status: DC
  Administered 2021-03-05 – 2021-03-07 (×4): 1 [drp] via OPHTHALMIC
  Filled 2021-03-04 (×2): qty 5

## 2021-03-04 MED ORDER — METHYLPREDNISOLONE SODIUM SUCC 125 MG IJ SOLR: 125.0000 mg | Freq: Once | INTRAMUSCULAR | Status: AC | PRN

## 2021-03-04 MED ORDER — TACROLIMUS 1 MG PO CAPS
4.0000 mg | ORAL_CAPSULE | Freq: Two times a day (BID) | ORAL | Status: DC
  Administered 2021-03-04 – 2021-03-07 (×6): 4 mg via ORAL
  Filled 2021-03-04 (×6): qty 4

## 2021-03-04 MED ORDER — PREDNISONE 5 MG/5ML PO SOLN
15.0000 mg | Freq: Every day | ORAL | Status: DC
  Administered 2021-03-05 – 2021-03-07 (×3): 15 mg via ORAL
  Filled 2021-03-04 (×3): qty 15

## 2021-03-04 MED ORDER — LATANOPROSTENE BUNOD 0.024 % OP SOLN: 1.0000 [drp] | Freq: Every day | OPHTHALMIC | Status: DC

## 2021-03-04 MED ORDER — MAGNESIUM OXIDE -MG SUPPLEMENT 400 (240 MG) MG PO TABS
800.0000 mg | ORAL_TABLET | Freq: Two times a day (BID) | ORAL | Status: DC
  Administered 2021-03-04 – 2021-03-07 (×6): 800 mg via ORAL
  Filled 2021-03-04 (×6): qty 2

## 2021-03-04 MED ORDER — SODIUM CHLORIDE 0.9 % IV SOLN: INTRAVENOUS | Status: AC | PRN

## 2021-03-04 NOTE — ED Triage Notes (Addendum)
Patient complains of CP/SOB and right flank pain that started yesterday. Patient with remote hx of kidney transplant. Patient alert and oriented Patient received asa and 1 sl ntg prior to arrival by EMS Patient reports pain worse with inspiration and cough

## 2021-03-04 NOTE — ED Provider Notes (Signed)
[facility] EMERGENCY DEPARTMENT Provider Note   CSN: [id number] Arrival date & time: 03/04/21  1259     History No chief complaint on file.   Jon [name] is a 65 y.o. male.  The history is provided by the patient.  Chest Pain Pain location:  Substernal area Pain quality: sharp and stabbing   Pain radiates to:  Mid back Pain severity:  Moderate Onset quality:  Sudden Duration:  1 day Timing:  Constant Progression:  Unchanged Chronicity:  New Context: at rest   Worsened by:  Coughing and deep breathing Ineffective treatments:  Rest Associated symptoms: back pain and shortness of breath   Associated symptoms: no abdominal pain, no cough, no fatigue, no fever, no lower extremity edema, no nausea, no numbness, no palpitations, no syncope and no vomiting    65 year old male with a history of MI/CAD, small bowel obstruction, CKD status post renal transplant presenting to the emergency department with sudden onset chest pain and shortness of breath that radiated to his back.  Patient states that he developed sharp shooting chest pain that was substernal, radiating to his back earlier yesterday.  He states the pain has persisted.  It improved somewhat with nitroglycerin.  He received full dose aspirin and 1 sublingual nitroglycerin prior to arrival.  He additionally reports pleuritic component to his chest pain with an associated cough.  He denies any sick contacts or COVID-19 exposures.  Denies any fevers or chills.  Past Medical History:  Diagnosis Date   Chronic kidney disease    Dialysis patient ([place])    GERD (gastroesophageal reflux disease)    History of blood transfusion    Hyperlipidemia    takes Atorvastatin daily   Hypertension    takes Amlodipine,Losartan,and Labetalol daily   Myocardial infarction [facility])    lived in [place] maybe 7 years ago   Small bowel obstruction [facility])     Patient Active Problem List   Diagnosis Date Noted   Acute-on-chronic  kidney injury ([place]) 03/04/2021   COVID-19 virus infection 03/04/2021   Pulmonary nodule 05/01/2019   SBO (small bowel obstruction) ([place]) 04/17/2019   Kidney transplant recipient 08/26/2017   Cough 01/24/2017   Anemia due to chronic kidney disease    Thrombocytopenia ([place])    Encounter to establish care 10/16/2016   Right ankle pain 09/07/2016   HTN (hypertension) 05/07/2012    Past Surgical History:  Procedure Laterality Date   ARTERIOVENOUS GRAFT PLACEMENT     AV FISTULA PLACEMENT Right 10/25/2015   Procedure: ARTERIOVENOUS (AV) FISTULA CREATION RIGHT FOREARM;  Surgeon: Angelia Mould, MD;  Location: [place];  Service: Vascular;  Laterality: Right;   CARDIAC CATHETERIZATION N/A 05/05/2015   Procedure: Left Heart Cath and Coronary Angiography;  Surgeon: Charolette Forward, MD;  Location: [place] CV LAB;  Service: Cardiovascular;  Laterality: N/A;   LIGATION OF ARTERIOVENOUS  FISTULA Left 01/06/2019   Procedure: EXCISION OF ANEURYSMAL  ARTERIOVENOUS  FISTULA LEFT ARM;  Surgeon: Angelia Mould, MD;  Location: [place];  Service: Vascular;  Laterality: Left;   PERIPHERAL VASCULAR CATHETERIZATION N/A 08/18/2015   Procedure: Fistulagram;  Surgeon: Conrad Manvel, MD;  Location: [place] CV LAB;  Service: Cardiovascular;  Laterality: N/A;   PERIPHERAL VASCULAR CATHETERIZATION Right 04/19/2016   Procedure: Fistulagram;  Surgeon: Conrad [place], MD;  Location: [place] CV LAB;  Service: Cardiovascular;  Laterality: Right;   RESECTION OF ARTERIOVENOUS FISTULA ANEURYSM Left 02/05/2013   Procedure: REPAIR OF ANEURYSM OF LEFT ARM  ARTERIOVENOUS FISTULA ;  Surgeon: Serafina [place], MD;  Location: [place];  Service: Vascular;  Laterality: Left;   RESECTION OF ARTERIOVENOUS FISTULA ANEURYSM Left 03/08/2015   Procedure: REPAIR OF LEFT ARTERIOVENOUS FISTULA PSEUDOANEURYSM;  Surgeon: Angelia Mould, MD;  Location: [place];  Service: Vascular;  Laterality: Left;   REVISON OF ARTERIOVENOUS FISTULA Left  82/10/2351   Procedure: PLICATION OF A LARGE ANEURYSM LEFT UPPER ARM BRACHIO-CEPHALIC ARTERIOVENOUS FISTULA ;  Surgeon: Angelia Mould, MD;  Location: [facility] OR;  Service: Vascular;  Laterality: Left;   SMALL INTESTINE SURGERY         Family History  Problem Relation Age of Onset   Hypertension Mother    Other Mother        varicose veins   Diabetes Father    Hypertension Father    Hypertension Sister    Other Sister        varicose veins   Other Brother        varicose veins    Social History   Tobacco Use   Smoking status: Never   Smokeless tobacco: Never  Vaping Use   Vaping Use: Never used  Substance Use Topics   Alcohol use: No    Alcohol/week: 0.0 standard drinks   Drug use: No    Home Medications Prior to Admission medications   Medication Sig Start Date End Date Taking? Authorizing Provider  albuterol (PROVENTIL HFA;VENTOLIN HFA) 108 (90 Base) MCG/ACT inhaler Inhale 2 puffs into the lungs every 6 (six) hours as needed for wheezing or shortness of breath. 01/24/17  Yes Shela Leff, MD  amLODipine (NORVASC) 10 MG tablet Take 10 mg by mouth at bedtime.   Yes [provider]  aspirin EC 81 MG EC tablet Take 1 tablet (81 mg total) by mouth daily. 05/30/13  Yes Charolette Forward, MD  atorvastatin (LIPITOR) 20 MG tablet Take 20 mg by mouth daily.   Yes [provider]  carvedilol (COREG) 25 MG tablet Take 1 tablet (25 mg total) by mouth 2 (two) times daily with a meal. 04/24/19  Yes Mullis, Kiersten P, DO  COMBIGAN 0.2-0.5 % ophthalmic solution Place 1 drop into both eyes 2 (two) times daily. 02/08/21  Yes [provider]  hydrALAZINE (APRESOLINE) 50 MG tablet Take 1 tablet (50 mg total) by mouth 3 (three) times daily. 01/06/19  Yes Rhyne, Samantha J, PA-C  magnesium oxide (MAG-OX) 400 MG tablet Take 800 mg by mouth 2 (two) times a day. Take 2 hrs before or after Cellcept   Yes [provider]  mycophenolate (CELLCEPT) 200 MG/ML  suspension Take 5 mLs by mouth 2 (two) times a day. 03/20/18  Yes [provider]  PHOS-NAK 280-160-250 MG PACK Take 2 Packages by mouth 3 (three) times daily. 12/15/18  Yes [provider]  predniSONE 5 MG/5ML solution Take 5 mLs by mouth daily. 11/18/18  Yes [provider]  Sodium Bicarbonate POWD Take 5 mLs by mouth 2 (two) times a day. Baking soda   Yes [provider]  sulfamethoxazole-trimethoprim (BACTRIM) 200-40 MG/5ML suspension Take 10 mLs by mouth 3 (three) times a week. M- W- F 06/18/19  Yes [provider]  tacrolimus (PROGRAF) 1 MG capsule Take 4 mg by mouth 2 (two) times daily.    Yes [provider]  traMADol (ULTRAM) 50 MG tablet Take 1 tablet (50 mg total) by mouth every 6 (six) hours as needed for severe pain. 04/24/19  Yes Mullis, Kiersten P, DO  VYZULTA 0.024 % SOLN Place 1 drop into both eyes at bedtime. 02/08/21  Yes [provider]  fluticasone (FLOVENT HFA) 44 MCG/ACT inhaler Inhale 1 puff into the lungs 2 (two) times daily. Patient not taking: No sig reported 03/08/17 03/04/21  Chesley Mires, MD    Allergies    Patient has no known allergies.  Review of Systems   Review of Systems  Constitutional:  Negative for fatigue and fever.  Respiratory:  Positive for shortness of breath. Negative for cough.   Cardiovascular:  Positive for chest pain. Negative for palpitations and syncope.  Gastrointestinal:  Negative for abdominal pain, nausea and vomiting.  Musculoskeletal:  Positive for back pain.  Neurological:  Negative for numbness.   Physical Exam Updated Vital Signs BP 113/72   Pulse 76   Temp 100.1 F (37.8 C) (Oral)   Resp (!) 23   Ht 6' (1.829 m)   Wt 107.5 kg   SpO2 95%   BMI 32.14 kg/m   Physical Exam Vitals and nursing note reviewed.  Constitutional:      Appearance: He is well-developed.  HENT:     Head: Normocephalic and atraumatic.  Eyes:     Conjunctiva/sclera: Conjunctivae normal.   Cardiovascular:     Rate and Rhythm: Normal rate and regular rhythm.     Pulses: Normal pulses.     Heart sounds: No murmur heard. Pulmonary:     Effort: Pulmonary effort is normal. No respiratory distress.     Breath sounds: Normal breath sounds.  Abdominal:     Palpations: Abdomen is soft.     Tenderness: There is no abdominal tenderness.  Musculoskeletal:     Cervical back: Neck supple.  Skin:    General: Skin is warm and dry.     Capillary Refill: Capillary refill takes less than 2 seconds.  Neurological:     General: No focal deficit present.     Mental Status: He is alert. Mental status is at baseline.    ED Results / Procedures / Treatments   Labs (all labs ordered are listed, but only abnormal results are displayed) Labs Reviewed  RESP PANEL BY RT-PCR (FLU A&B, COVID) ARPGX2 - Abnormal; Notable for the following components:      Result Value   SARS Coronavirus 2 by RT PCR POSITIVE (*)    All other components within normal limits  BASIC METABOLIC PANEL - Abnormal; Notable for the following components:   Glucose, Bld 153 (*)    Creatinine, Ser 1.93 (*)    Calcium 8.6 (*)    GFR, Estimated 39 (*)    All other components within normal limits  CBC - Abnormal; Notable for the following components:   Hemoglobin 12.9 (*)    Platelets 114 (*)    All other components within normal limits  CBC WITH DIFFERENTIAL/PLATELET - Abnormal; Notable for the following components:   Platelets 106 (*)    Lymphs Abs 0.6 (*)    All other components within normal limits  COMPREHENSIVE METABOLIC PANEL - Abnormal; Notable for the following components:   CO2 21 (*)    Glucose, Bld 112 (*)    Creatinine, Ser 1.46 (*)    Calcium 8.7 (*)    Total Protein 6.3 (*)    Albumin 3.2 (*)    GFR, Estimated 54 (*)    All other components within normal limits  C-REACTIVE PROTEIN - Abnormal; Notable for the following components:   CRP 1.5 (*)    All other components  within normal limits  D-DIMER,  QUANTITATIVE - Abnormal; Notable for the following components:   D-Dimer, Quant 0.96 (*)    All other components within normal limits  TROPONIN I (HIGH SENSITIVITY) - Abnormal; Notable for the following components:   Troponin I (High Sensitivity) 184 (*)    All other components within normal limits  TROPONIN I (HIGH SENSITIVITY) - Abnormal; Notable for the following components:   Troponin I (High Sensitivity) 144 (*)    All other components within normal limits  LIPASE, BLOOD  HIV ANTIBODY (ROUTINE TESTING W REFLEX)  PROCALCITONIN    EKG EKG Interpretation  Date/Time:  Saturday March 04 2021 13:12:15 EDT Ventricular Rate:  75 PR Interval:  162 QRS Duration: 100 QT Interval:  422 QTC Calculation: 471 R Axis:   -53 Text Interpretation: Normal sinus rhythm Left anterior fascicular block Minimal voltage criteria for LVH, may be normal variant ( Cornell product ) ST & T wave abnormality, consider lateral ischemia Prolonged QT Abnormal ECG Confirmed by Regan Lemming ([id number]) on 03/04/2021 7:04:02 PM  Radiology DG Chest 2 View  Result Date: 03/04/2021 CLINICAL DATA:  Chest and back pain. EXAM: CHEST - 2 VIEW COMPARISON:  02/22/2017 FINDINGS: Prominent central vascular structures are similar to the previous examination. No overt pulmonary edema or focal airspace disease. Heart size is normal. No large pleural effusions. No acute bone abnormality. Negative for a pneumothorax. IMPRESSION: No active cardiopulmonary disease. Electronically Signed   By: Markus Daft M.D.   On: 03/04/2021 13:36   CT Angio Chest/Abd/Pel for Dissection W and/or Wo Contrast  Result Date: 03/04/2021 CLINICAL DATA:  Chest pain or back pain, aortic dissection suspected EXAM: CT ANGIOGRAPHY CHEST, ABDOMEN AND PELVIS TECHNIQUE: Non-contrast CT of the chest was initially obtained. Multidetector CT imaging through the chest, abdomen and pelvis was performed using the standard protocol during bolus administration of intravenous  contrast. Multiplanar reconstructed images and MIPs were obtained and reviewed to evaluate the vascular anatomy. CONTRAST:  29mL OMNIPAQUE IOHEXOL 350 MG/ML SOLN COMPARISON:  Chest CT May 08, 2019 and CT abdomen pelvis April 17, 2019. FINDINGS: CTA CHEST FINDINGS Cardiovascular: No evidence of intramural hematoma on non contrasted series. Aortic atherosclerosis. Preferential opacification of the thoracic aorta. No evidence of thoracic aortic aneurysm or dissection. Normal heart size. Trace pericardial effusion, likely physiologic. Three-vessel coronary artery calcifications. Calcifications of the mitral annulus and aortic valve. Mediastinum/Nodes: No enlarged mediastinal, hilar, or axillary lymph nodes. Thyroid gland, trachea, and esophagus demonstrate no significant findings. Lungs/Pleura: Bilateral diffuse multifocal peripheral predominant ground-glass opacities. Similar appearance of the right middle lobe pulmonary nodules, some of which are in tree-in-bud morphology, with the largest nodule measuring 9 x 5 mm, mean of 7 mm and unchanged. No pleural effusion. No pneumothorax. Musculoskeletal: No acute osseous abnormality. Review of the MIP images confirms the above findings. CTA ABDOMEN AND PELVIS FINDINGS VASCULAR Aorta: Aortic atherosclerosis. Normal caliber aorta without aneurysm, dissection, vasculitis or significant stenosis. Celiac: Patent without evidence of aneurysm, dissection, vasculitis or significant stenosis. SMA: Patent without evidence of aneurysm, dissection, vasculitis or significant stenosis. Renals: Both renal arteries are patent. Right lower quadrant transplant kidney artery extends from the external iliac artery and appears patent without evidence of aneurysm or significant stenosis. IMA: Patent without evidence of aneurysm, dissection, vasculitis or significant stenosis. Inflow: Patent without evidence of aneurysm, dissection, vasculitis or significant stenosis. Veins: No obvious  venous abnormality within the limitations of this arterial phase study. Review of the MIP images confirms the above findings. NON-VASCULAR  Hepatobiliary: No suspicious hepatic lesion. Gallbladder is distended with stones/sludge in the gallbladder neck, without evidence of wall thickening or pericholecystic fluid. No biliary ductal dilation. Pancreas: Within normal limits. Spleen: Within normal limits. Adrenals/Urinary Tract: Bilateral adrenal glands are unremarkable. The native kidneys are atrophic with nonobstructive nephrolithiasis. Enhancing 9 mm right upper pole renal lesion on image 150/8 hypodense bilateral renal lesions, incompletely characterized on this study but statistically likely to represent cysts. Right lower quadrant transplant kidney without hydronephrosis nephrolithiasis or solid enhancing renal mass. 6 mm stone in the bladder, unchanged from prior. Stomach/Bowel: Small hiatal hernia otherwise the stomach is unremarkable. No pathologic dilation of small bowel. Anastomotic sutures in the anterior left abdomen and right lower quadrant. Colonic diverticulosis without findings of acute diverticulitis. Lymphatic: No pathologically enlarged abdominal or pelvic lymph nodes. Reproductive: Prostate is unremarkable. Other: No abdominopelvic free fluid. Musculoskeletal: Multilevel degenerative changes spine. No acute osseous abnormality. Review of the MIP images confirms the above findings. IMPRESSION: 1. No evidence of thoracic or abdominal aortic aneurysm or dissection. 2. Bilateral diffuse multifocal peripheral predominant ground-glass opacities, suspicious for an atypical infectious or inflammatory process. 3. Similar appearance of the right middle lobe pulmonary nodules, some of which are in tree-in-bud morphology, with the largest nodule measuring 7 mm and unchanged. Most consistent with atypical infection, possibly atypical mycobacterium. 4. Distended gallbladder with stones/sludge in the gallbladder  neck, without evidence of wall thickening or pericholecystic fluid. If there is concern for acute cholecystitis, recommend right upper quadrant ultrasound. 5. Atrophic native kidneys with a right lower quadrant transplant kidney. 6. Enhancing 9 mm right upper pole renal lesion, suspicious for a small renal cell carcinoma. Additional hypodense tiny bilateral renal lesions which are incompletely characterized on this study but statistically likely to represent cysts. Nonemergent urology consult suggested. 7. Similar 6 mm bladder stone. 8. Colonic diverticulosis without findings of acute diverticulitis. 9.  Aortic Atherosclerosis (ICD10-I70.0). Electronically Signed   By: Dahlia Bailiff MD   On: 03/04/2021 16:16    Procedures Procedures   Medications Ordered in ED Medications  nitroGLYCERIN (NITROSTAT) SL tablet 0.4 mg (has no administration in time range)  atorvastatin (LIPITOR) tablet 20 mg (20 mg Oral Given 03/05/21 1011)  mycophenolate (CELLCEPT) oral suspension 50 mg/mL (1,000 mg Oral Given 03/05/21 1009)  tacrolimus (PROGRAF) capsule 4 mg (4 mg Oral Given 03/05/21 1012)  sulfamethoxazole-trimethoprim (BACTRIM) 200-40 MG/5ML suspension 10 mL (has no administration in time range)  Latanoprostene [place] 0.024 % SOLN 1 drop (has no administration in time range)  traMADol (ULTRAM) tablet 50 mg (has no administration in time range)  magnesium oxide (MAG-OX) tablet 800 mg (800 mg Oral Given 03/05/21 0733)  albuterol (PROVENTIL) (2.5 MG/3ML) 0.083% nebulizer solution 2.5 mg (has no administration in time range)  aspirin EC tablet 81 mg (81 mg Oral Given 03/05/21 1010)  potassium & sodium phosphates (PHOS-NAK) 280-160-250 MG packet 2 packet (2 packets Oral Given 03/05/21 1014)  carvedilol (COREG) tablet 25 mg (25 mg Oral Given 03/05/21 0733)  0.9 %  sodium chloride infusion (has no administration in time range)  diphenhydrAMINE (BENADRYL) injection 50 mg (has no administration in time range)  famotidine (PEPCID)  IVPB 20 mg premix (has no administration in time range)  methylPREDNISolone sodium succinate (SOLU-MEDROL) 125 mg/2 mL injection 125 mg (has no administration in time range)  albuterol (VENTOLIN HFA) 108 (90 Base) MCG/ACT inhaler 2 puff (has no administration in time range)  EPINEPHrine (EPI-PEN) injection 0.3 mg (has no administration in time range)  timolol (  TIMOPTIC) 0.5 % ophthalmic solution 1 drop (1 drop Both Eyes Given 03/05/21 1015)    And  brimonidine (ALPHAGAN) 0.2 % ophthalmic solution 1 drop (1 drop Both Eyes Given 03/05/21 1015)  predniSONE 5 MG/5ML solution 15 mg (15 mg Oral Given 03/05/21 1010)  heparin injection 5,000 Units (5,000 Units Subcutaneous Given 03/05/21 0733)  acetaminophen (TYLENOL) tablet 650 mg (650 mg Oral Given 03/05/21 0738)  ondansetron (ZOFRAN) tablet 4 mg (has no administration in time range)    Or  ondansetron (ZOFRAN) injection 4 mg (has no administration in time range)  iohexol (OMNIPAQUE) 350 MG/ML injection 65 mL (65 mLs Intravenous Contrast Given 03/04/21 1533)  bebtelovimab EUA injection SOLN 175 mg (175 mg Intravenous Given 03/04/21 2355)    ED Course  I have reviewed the triage vital signs and the nursing notes.  Pertinent labs & imaging results that were available during my care of the patient were reviewed by me and considered in my medical decision making (see chart for details).    MDM Rules/Calculators/A&P                           65 year old male with a history of CKD s/p renal transplant currently on immunosuppression, MI/CAD, small bowel obstruction presents to the emergency department with sudden onset sharp substernal chest pain with radiation to the back.   Pertinent exam findings include: Mildly uncomfortable 65 year old male in no apparent distress.  Lungs clear to auscultation bilaterally.  EKG: Normal sinus rhythm with a rate of 75 left anterior fascicular block, minimal voltage criteria for LVH, ST-T changes present, prolonged QTC  471   Thought process: The patient is a 65 year old male with a history of renal transplant on immunosuppression, prior MI/CAD presenting to the emergency department with sharp substernal chest pain with radiation to the back that is been present for the past 12 hours. Differential diagnosis includes: ACS, aortic dissection, COVID-19, pneumonia, pneumothorax, pulmonary embolism,pericarditis/myocarditis, GERD, PUD, musculoskeletal. Patient given ASA 325 mg, also given nitroglycerin.  Patient's last cardiac catheterization was clean in 2016.   Lab results include: Initial troponin 184, downtrending to 144, COVID-19 PCR positive, lipase normal, CBC without a leukocytosis, WC 4.1, hemoglobin mildly anemic 12.9, platelets low 114.  Imaging results include: CTA chest abdomen pelvis aortogram without evidence of aortic dissection.  No evidence of thoracic or abdominal aortic aneurysm.  Bilateral diffuse multifocal groundglass opacities concerning for COVID-19.  Unchanged pulmonary nodules with tree-in-bud appearance.  Distended gallbladder with stones and sludge in the gallbladder neck.  No edema or evidence of acute cholecystitis.  Course of tx has consisted of: Status post aspirin 325, nitroglycerin.  Given the patient's elevated troponin, immunosuppressed status with a renal transplant, COVID-19 diagnosis, hospitalist medicine was consulted for admission and recommended monoclonal antibody therapy which was ordered.  Given the downtrending troponin, lower concern for ACS at this time.   Patient's clinical presentation is most consistent with COVID-19.  The patient was subsequently admitted.  Final Clinical Impression(s) / ED Diagnoses Final diagnoses:  Elevated troponin  COVID-19    Rx / DC Orders     Regan Lemming, MD 03/05/21 1151

## 2021-03-04 NOTE — H&P (Addendum)
History and Physical    Jon [name] GGY:[id number] DOB: 1955/09/26 DOA: 03/04/2021  PCP: Ronnald Ramp, MD  Patient coming from: Home  I have personally briefly reviewed patient's old medical records in [facility]  Chief Complaint: CP  HPI: Jon [name] is a 65 y.o. male with medical history significant of HTN, HLD, prior ESRD s/p renal transplant.  Pt on immunosuppressives for renal transplant.  Chart diagnosis of MI but very unimpressive LHC in 2016.  Pt unvaccinated to COVID-19.  Pt presents to ED with c/o 1 day h/o sudden onset CP, SOB.  CP is sharp, substernal, radiates to mid back.  Worse with cough and deep breathing.  Does have associated cough.  Helped minimally by NTG.  Got ASA + NTG pta.  No known sick contacts.   ED Course: Trops 184 and 144.  Creat 1.93 (1.5 in Feb).  WBC nl.  CTA c/a/p - 1) peripheral ground glass opacities suggestive of atypical infection. 2) unchanged pulm nodules w tree-in-bud appearance, ? Atypical mycobacterial. 3) distended gallbladder with stones/sludge in gallbladder neck but no edema or evidence of acute cholecystitis.  COVID-19 positive.   Review of Systems: As per HPI, otherwise all review of systems negative.  Past Medical History:  Diagnosis Date   Chronic kidney disease    Dialysis patient (HCC)    GERD (gastroesophageal reflux disease)    History of blood transfusion    Hyperlipidemia    takes Atorvastatin daily   Hypertension    takes Amlodipine,Losartan,and Labetalol daily   Myocardial infarction [facility])    lived in [place] maybe 7 years ago   Small bowel obstruction (HCC)     Past Surgical History:  Procedure Laterality Date   ARTERIOVENOUS GRAFT PLACEMENT     AV FISTULA PLACEMENT Right 10/25/2015   Procedure: ARTERIOVENOUS (AV) FISTULA CREATION RIGHT FOREARM;  Surgeon: Chuck Hint, MD;  Location: [facility] OR;  Service: Vascular;  Laterality: Right;   CARDIAC CATHETERIZATION N/A 05/05/2015    Procedure: Left Heart Cath and Coronary Angiography;  Surgeon: Rinaldo Cloud, MD;  Location: [facility] INVASIVE CV LAB;  Service: Cardiovascular;  Laterality: N/A;   LIGATION OF ARTERIOVENOUS  FISTULA Left 01/06/2019   Procedure: EXCISION OF ANEURYSMAL  ARTERIOVENOUS  FISTULA LEFT ARM;  Surgeon: Chuck Hint, MD;  Location: [facility] OR;  Service: Vascular;  Laterality: Left;   PERIPHERAL VASCULAR CATHETERIZATION N/A 08/18/2015   Procedure: Fistulagram;  Surgeon: Fransisco Hertz, MD;  Location: [facility] INVASIVE CV LAB;  Service: Cardiovascular;  Laterality: N/A;   PERIPHERAL VASCULAR CATHETERIZATION Right 04/19/2016   Procedure: Fistulagram;  Surgeon: Fransisco Hertz, MD;  Location: [facility] INVASIVE CV LAB;  Service: Cardiovascular;  Laterality: Right;   RESECTION OF ARTERIOVENOUS FISTULA ANEURYSM Left 02/05/2013   Procedure: REPAIR OF ANEURYSM OF LEFT ARM ARTERIOVENOUS FISTULA ;  Surgeon: Nada Libman, MD;  Location: [facility] OR;  Service: Vascular;  Laterality: Left;   RESECTION OF ARTERIOVENOUS FISTULA ANEURYSM Left 03/08/2015   Procedure: REPAIR OF LEFT ARTERIOVENOUS FISTULA PSEUDOANEURYSM;  Surgeon: Chuck Hint, MD;  Location: [facility] OR;  Service: Vascular;  Laterality: Left;   REVISON OF ARTERIOVENOUS FISTULA Left 06/02/2015   Procedure: PLICATION OF A LARGE ANEURYSM LEFT UPPER ARM BRACHIO-CEPHALIC ARTERIOVENOUS FISTULA ;  Surgeon: Chuck Hint, MD;  Location: [facility] OR;  Service: Vascular;  Laterality: Left;   SMALL INTESTINE SURGERY       reports that he has never smoked. He has never used smokeless tobacco. He reports that he does not drink  alcohol and does not use drugs.  No Known Allergies  Family History  Problem Relation Age of Onset   Hypertension Mother    Other Mother        varicose veins   Diabetes Father    Hypertension Father    Hypertension Sister    Other Sister        varicose veins   Other Brother        varicose veins     Prior to Admission medications   Medication Sig Start  Date End Date Taking? Authorizing Provider  albuterol (PROVENTIL HFA;VENTOLIN HFA) 108 (90 Base) MCG/ACT inhaler Inhale 2 puffs into the lungs every 6 (six) hours as needed for wheezing or shortness of breath. 01/24/17  Yes John Giovanni, MD  amLODipine (NORVASC) 10 MG tablet Take 10 mg by mouth at bedtime.   Yes [provider]  aspirin EC 81 MG EC tablet Take 1 tablet (81 mg total) by mouth daily. 05/30/13  Yes Rinaldo Cloud, MD  atorvastatin (LIPITOR) 20 MG tablet Take 20 mg by mouth daily.   Yes [provider]  carvedilol (COREG) 25 MG tablet Take 1 tablet (25 mg total) by mouth 2 (two) times daily with a meal. 04/24/19  Yes Mullis, Kiersten P, DO  COMBIGAN 0.2-0.5 % ophthalmic solution Place 1 drop into both eyes 2 (two) times daily. 02/08/21  Yes [provider]  hydrALAZINE (APRESOLINE) 50 MG tablet Take 1 tablet (50 mg total) by mouth 3 (three) times daily. 01/06/19  Yes Rhyne, Samantha J, PA-C  magnesium oxide (MAG-OX) 400 MG tablet Take 800 mg by mouth 2 (two) times a day. Take 2 hrs before or after Cellcept   Yes [provider]  mycophenolate (CELLCEPT) 200 MG/ML suspension Take 5 mLs by mouth 2 (two) times a day. 03/20/18  Yes [provider]  PHOS-NAK 280-160-250 MG PACK Take 2 Packages by mouth 3 (three) times daily. 12/15/18  Yes [provider]  predniSONE 5 MG/5ML solution Take 5 mLs by mouth daily. 11/18/18  Yes [provider]  Sodium Bicarbonate POWD Take 5 mLs by mouth 2 (two) times a day. Baking soda   Yes [provider]  sulfamethoxazole-trimethoprim (BACTRIM) 200-40 MG/5ML suspension Take 10 mLs by mouth 3 (three) times a week. M- W- F 06/18/19  Yes [provider]  tacrolimus (PROGRAF) 1 MG capsule Take 4 mg by mouth 2 (two) times daily.    Yes [provider]  traMADol (ULTRAM) 50 MG tablet Take 1 tablet (50 mg total) by mouth every 6 (six) hours as needed for severe pain. 04/24/19  Yes  Mullis, Kiersten P, DO  VYZULTA 0.024 % SOLN Place 1 drop into both eyes at bedtime. 02/08/21  Yes [provider]  fluticasone (FLOVENT HFA) 44 MCG/ACT inhaler Inhale 1 puff into the lungs 2 (two) times daily. Patient not taking: No sig reported 03/08/17 03/04/21  Coralyn Helling, MD    Physical Exam: Vitals:   03/04/21 1845 03/04/21 1900 03/04/21 1915 03/04/21 2100  BP: 134/80 129/79 131/81 126/84  Pulse: 67 69 70 71  Resp: (!) 22 13 18 20   Temp:      SpO2: 96% 97% 97% 98%    Constitutional: NAD, calm, comfortable Eyes: PERRL, lids and conjunctivae normal ENMT: Mucous membranes are moist. Posterior pharynx clear of any exudate or lesions.Normal dentition.  Neck: normal, supple, no masses, no thyromegaly Respiratory: clear to auscultation bilaterally, no wheezing, no crackles. Normal respiratory effort. No accessory muscle  use.  Cardiovascular: Regular rate and rhythm, no murmurs / rubs / gallops. No extremity edema. 2+ pedal pulses. No carotid bruits.  Abdomen: no tenderness, no masses palpated. No hepatosplenomegaly. Bowel sounds positive.  Musculoskeletal: no clubbing / cyanosis. No joint deformity upper and lower extremities. Good ROM, no contractures. Normal muscle tone.  Skin: no rashes, lesions, ulcers. No induration Neurologic: CN 2-12 grossly intact. Sensation intact, DTR normal. Strength 5/5 in all 4.  Psychiatric: Normal judgment and insight. Alert and oriented x 3. Normal mood.    Labs on Admission: I have personally reviewed following labs and imaging studies  CBC: Recent Labs  Lab 03/04/21 1313  WBC 4.1  HGB 12.9*  HCT 41.2  MCV 85.8  PLT 114*   Basic Metabolic Panel: Recent Labs  Lab 03/04/21 1313  NA 136  K 3.6  CL 105  CO2 24  GLUCOSE 153*  BUN 12  CREATININE 1.93*  CALCIUM 8.6*   GFR: CrCl cannot be calculated (Unknown ideal weight.). Liver Function Tests: No results for input(s): AST, ALT, ALKPHOS, BILITOT, PROT, ALBUMIN in the last 168  hours. Recent Labs  Lab 03/04/21 1313  LIPASE 21   No results for input(s): AMMONIA in the last 168 hours. Coagulation Profile: No results for input(s): INR, PROTIME in the last 168 hours. Cardiac Enzymes: No results for input(s): CKTOTAL, CKMB, CKMBINDEX, TROPONINI in the last 168 hours. BNP (last 3 results) No results for input(s): PROBNP in the last 8760 hours. HbA1C: No results for input(s): HGBA1C in the last 72 hours. CBG: No results for input(s): GLUCAP in the last 168 hours. Lipid Profile: No results for input(s): CHOL, HDL, LDLCALC, TRIG, CHOLHDL, LDLDIRECT in the last 72 hours. Thyroid Function Tests: No results for input(s): TSH, T4TOTAL, FREET4, T3FREE, THYROIDAB in the last 72 hours. Anemia Panel: No results for input(s): VITAMINB12, FOLATE, FERRITIN, TIBC, IRON, RETICCTPCT in the last 72 hours. Urine analysis:    Component Value Date/Time   COLORURINE YELLOW 04/17/2019 1330   APPEARANCEUR CLOUDY (A) 04/17/2019 1330   LABSPEC 1.014 04/17/2019 1330   PHURINE 8.0 04/17/2019 1330   GLUCOSEU NEGATIVE 04/17/2019 1330   HGBUR NEGATIVE 04/17/2019 1330   BILIRUBINUR NEGATIVE 04/17/2019 1330   KETONESUR NEGATIVE 04/17/2019 1330   PROTEINUR 30 (A) 04/17/2019 1330   UROBILINOGEN 0.2 07/04/2010 0532   NITRITE NEGATIVE 04/17/2019 1330   LEUKOCYTESUR NEGATIVE 04/17/2019 1330    Radiological Exams on Admission: DG Chest 2 View  Result Date: 03/04/2021 CLINICAL DATA:  Chest and back pain. EXAM: CHEST - 2 VIEW COMPARISON:  02/22/2017 FINDINGS: Prominent central vascular structures are similar to the previous examination. No overt pulmonary edema or focal airspace disease. Heart size is normal. No large pleural effusions. No acute bone abnormality. Negative for a pneumothorax. IMPRESSION: No active cardiopulmonary disease. Electronically Signed   By: Richarda Overlie M.D.   On: 03/04/2021 13:36   CT Angio Chest/Abd/Pel for Dissection W and/or Wo Contrast  Result Date:  03/04/2021 CLINICAL DATA:  Chest pain or back pain, aortic dissection suspected EXAM: CT ANGIOGRAPHY CHEST, ABDOMEN AND PELVIS TECHNIQUE: Non-contrast CT of the chest was initially obtained. Multidetector CT imaging through the chest, abdomen and pelvis was performed using the standard protocol during bolus administration of intravenous contrast. Multiplanar reconstructed images and MIPs were obtained and reviewed to evaluate the vascular anatomy. CONTRAST:  65mL OMNIPAQUE IOHEXOL 350 MG/ML SOLN COMPARISON:  Chest CT May 08, 2019 and CT abdomen pelvis April 17, 2019. FINDINGS: CTA CHEST FINDINGS Cardiovascular: No evidence  of intramural hematoma on non contrasted series. Aortic atherosclerosis. Preferential opacification of the thoracic aorta. No evidence of thoracic aortic aneurysm or dissection. Normal heart size. Trace pericardial effusion, likely physiologic. Three-vessel coronary artery calcifications. Calcifications of the mitral annulus and aortic valve. Mediastinum/Nodes: No enlarged mediastinal, hilar, or axillary lymph nodes. Thyroid gland, trachea, and esophagus demonstrate no significant findings. Lungs/Pleura: Bilateral diffuse multifocal peripheral predominant ground-glass opacities. Similar appearance of the right middle lobe pulmonary nodules, some of which are in tree-in-bud morphology, with the largest nodule measuring 9 x 5 mm, mean of 7 mm and unchanged. No pleural effusion. No pneumothorax. Musculoskeletal: No acute osseous abnormality. Review of the MIP images confirms the above findings. CTA ABDOMEN AND PELVIS FINDINGS VASCULAR Aorta: Aortic atherosclerosis. Normal caliber aorta without aneurysm, dissection, vasculitis or significant stenosis. Celiac: Patent without evidence of aneurysm, dissection, vasculitis or significant stenosis. SMA: Patent without evidence of aneurysm, dissection, vasculitis or significant stenosis. Renals: Both renal arteries are patent. Right lower quadrant  transplant kidney artery extends from the external iliac artery and appears patent without evidence of aneurysm or significant stenosis. IMA: Patent without evidence of aneurysm, dissection, vasculitis or significant stenosis. Inflow: Patent without evidence of aneurysm, dissection, vasculitis or significant stenosis. Veins: No obvious venous abnormality within the limitations of this arterial phase study. Review of the MIP images confirms the above findings. NON-VASCULAR Hepatobiliary: No suspicious hepatic lesion. Gallbladder is distended with stones/sludge in the gallbladder neck, without evidence of wall thickening or pericholecystic fluid. No biliary ductal dilation. Pancreas: Within normal limits. Spleen: Within normal limits. Adrenals/Urinary Tract: Bilateral adrenal glands are unremarkable. The native kidneys are atrophic with nonobstructive nephrolithiasis. Enhancing 9 mm right upper pole renal lesion on image 150/8 hypodense bilateral renal lesions, incompletely characterized on this study but statistically likely to represent cysts. Right lower quadrant transplant kidney without hydronephrosis nephrolithiasis or solid enhancing renal mass. 6 mm stone in the bladder, unchanged from prior. Stomach/Bowel: Small hiatal hernia otherwise the stomach is unremarkable. No pathologic dilation of small bowel. Anastomotic sutures in the anterior left abdomen and right lower quadrant. Colonic diverticulosis without findings of acute diverticulitis. Lymphatic: No pathologically enlarged abdominal or pelvic lymph nodes. Reproductive: Prostate is unremarkable. Other: No abdominopelvic free fluid. Musculoskeletal: Multilevel degenerative changes spine. No acute osseous abnormality. Review of the MIP images confirms the above findings. IMPRESSION: 1. No evidence of thoracic or abdominal aortic aneurysm or dissection. 2. Bilateral diffuse multifocal peripheral predominant ground-glass opacities, suspicious for an atypical  infectious or inflammatory process. 3. Similar appearance of the right middle lobe pulmonary nodules, some of which are in tree-in-bud morphology, with the largest nodule measuring 7 mm and unchanged. Most consistent with atypical infection, possibly atypical mycobacterium. 4. Distended gallbladder with stones/sludge in the gallbladder neck, without evidence of wall thickening or pericholecystic fluid. If there is concern for acute cholecystitis, recommend right upper quadrant ultrasound. 5. Atrophic native kidneys with a right lower quadrant transplant kidney. 6. Enhancing 9 mm right upper pole renal lesion, suspicious for a small renal cell carcinoma. Additional hypodense tiny bilateral renal lesions which are incompletely characterized on this study but statistically likely to represent cysts. Nonemergent urology consult suggested. 7. Similar 6 mm bladder stone. 8. Colonic diverticulosis without findings of acute diverticulitis. 9.  Aortic Atherosclerosis (ICD10-I70.0). Electronically Signed   By: Maudry Mayhew MD   On: 03/04/2021 16:16    EKG: Independently reviewed.  Assessment/Plan Principal Problem:   COVID-19 virus infection Active Problems:   HTN (hypertension)   Kidney transplant  recipient   Pulmonary nodule   Acute-on-chronic kidney injury (HCC)    COVID-19 virus infection - No O2 requirement but PT high risk for severe dz given: Unvaccinated Transplant pt on multiple immunosuppressives Getting MAB therapy in ED Will then admit for obs post MAB therapy COVID-19 pathway Tele monitor Cont pulse ox CRP, d.dimer, procalcitonin, LFT Daily labs Kidney transplant - Cont anti rejection meds Increase prednisone to "sick" dose by tripling up on it (15mg  daily for the moment instead of 5mg ). Creat 1.9 up from 1.5 in Feb Could just be due to COVID May want to curbside nephrology in AM HTN - Cont Coreg Holding amlodipine and Hydralazine for the moment Pulm nodules - Unchanged from  prior ? MAI  DVT prophylaxis: Heparin [place] Code Status: Full Family Communication: No family in room Disposition Plan: Home after obs if stable Consults called: None Admission status: Place in 37     Jon [name] M. DO [facility]  How to contact the [facility] Attending or Consulting provider 7A - 7P or covering provider during after hours 7P -7A, for this patient?  Check the care team in [facility] and look for a) attending/consulting [facility] provider listed and b) the [facility] team listed Log into [email or web address]  Am[employer] Physician Scheduling and messaging for groups and whole hospitals  On call and physician scheduling software for group practices, residents, hospitalists and other medical providers for call, clinic, rotation and shift schedules. On[employer] is a hospital-wide system for scheduling doctors and paging doctors on call. EasyPlot is for scientific plotting and data analysis.  [email or web address]  and use [place]'s universal password to access. If you do not have the password, please contact the hospital operator.  Locate the [facility] provider you are looking for under [facility] and page to a number that you can be directly reached. If you still have difficulty reaching the provider, please page the [facility] (Director on Call) for the Hospitalists listed on amion for assistance.  03/04/2021, 9:47 PM

## 2021-03-04 NOTE — ED Notes (Signed)
Patient transported to CT 

## 2021-03-04 NOTE — ED Notes (Signed)
ED Provider at bedside. 

## 2021-03-05 DIAGNOSIS — U071 COVID-19: Secondary | ICD-10-CM | POA: Diagnosis not present

## 2021-03-05 LAB — COMPREHENSIVE METABOLIC PANEL
ALT: 15 U/L (ref 0–44)
AST: 24 U/L (ref 15–41)
Albumin: 3.2 g/dL — ABNORMAL LOW (ref 3.5–5.0)
Alkaline Phosphatase: 65 U/L (ref 38–126)
Anion gap: 11 (ref 5–15)
BUN: 11 mg/dL (ref 8–23)
CO2: 21 mmol/L — ABNORMAL LOW (ref 22–32)
Calcium: 8.7 mg/dL — ABNORMAL LOW (ref 8.9–10.3)
Chloride: 106 mmol/L (ref 98–111)
Creatinine, Ser: 1.46 mg/dL — ABNORMAL HIGH (ref 0.61–1.24)
GFR, Estimated: 54 mL/min — ABNORMAL LOW (ref >60)
Glucose, Bld: 112 mg/dL — ABNORMAL HIGH (ref 70–99)
Potassium: 3.5 mmol/L (ref 3.5–5.1)
Sodium: 138 mmol/L (ref 135–145)
Total Bilirubin: 1 mg/dL (ref 0.3–1.2)
Total Protein: 6.3 g/dL — ABNORMAL LOW (ref 6.5–8.1)

## 2021-03-05 LAB — CBC WITH DIFFERENTIAL/PLATELET
Abs Immature Granulocytes: 0.05 10*3/uL (ref 0.00–0.07)
Basophils Absolute: 0 10*3/uL (ref 0.0–0.1)
Basophils Relative: 0 %
Eosinophils Absolute: 0 10*3/uL (ref 0.0–0.5)
Eosinophils Relative: 0 %
HCT: 43.3 % (ref 39.0–52.0)
Hemoglobin: 13.5 g/dL (ref 13.0–17.0)
Immature Granulocytes: 1 %
Lymphocytes Relative: 11 %
Lymphs Abs: 0.6 10*3/uL — ABNORMAL LOW (ref 0.7–4.0)
MCH: 27.1 pg (ref 26.0–34.0)
MCHC: 31.2 g/dL (ref 30.0–36.0)
MCV: 86.9 fL (ref 80.0–100.0)
Monocytes Absolute: 0.5 10*3/uL (ref 0.1–1.0)
Monocytes Relative: 9 %
Neutro Abs: 4.2 10*3/uL (ref 1.7–7.7)
Neutrophils Relative %: 79 %
Platelets: 106 10*3/uL — ABNORMAL LOW (ref 150–400)
RBC: 4.98 MIL/uL (ref 4.22–5.81)
RDW: 13.6 % (ref 11.5–15.5)
WBC: 5.3 10*3/uL (ref 4.0–10.5)
nRBC: 0 % (ref 0.0–0.2)

## 2021-03-05 LAB — C-REACTIVE PROTEIN: CRP: 1.5 mg/dL — ABNORMAL HIGH (ref <1.0)

## 2021-03-05 LAB — PROCALCITONIN: Procalcitonin: <0.1 ng/mL

## 2021-03-05 LAB — D-DIMER, QUANTITATIVE: D-Dimer, Quant: 0.96 ug/mL-FEU — ABNORMAL HIGH (ref 0.00–0.50)

## 2021-03-05 LAB — HIV ANTIBODY (ROUTINE TESTING W REFLEX): HIV Screen 4th Generation wRfx: NONREACTIVE

## 2021-03-05 NOTE — Progress Notes (Signed)
PROGRESS NOTE    Jon [name]  QMV:[id number] DOB: 1955-10-09 DOA: 03/04/2021 PCP: Ronnald Ramp, MD    Brief Narrative:  This 65 years old male with PMH significant for HTN, hyperlipidemia, prior end-stage renal disease, s/p renal transplant, patient is on immunosuppressants for renal transplant.  Patient had history of MI but has very unimpressive left heart cath in 2016. Patient is unvaccinated against COVID-19.  Patient presented in the ED with 1 day history of sudden onset of chest pain associated with shortness of breath.  He describes chest pain is substernal radiating towards the back, getting worse with coughing, deep breathing.  Patient has tried nitroglycerin without any significant relief.  CTA showed peripheral groundglass opacities consistent with atypical infection.  COVID test positive.  Assessment & Plan:   Principal Problem:   COVID-19 virus infection Active Problems:   HTN (hypertension)   Kidney transplant recipient   Pulmonary nodule   Acute-on-chronic kidney injury (HCC)   COVID-19 infection: Patient presented with chest pain associated with shortness of breath. He is not hypoxic, no oxygen requirement but at high risk for severe disease. Patient is unvaccinated against COVID, renal transplant on multiple immunosuppressants. Continue monoclonal antibody therapy in the ED. Continue airborne precautions, supportive care. Continue pulse oximetry. Monitor inflammatory markers, CRP, procalcitonin, D-dimer.  Kidney transplant: Patient is on multiple immunosuppressant medication. Continue above meds.  Increase prednisone dose.  15 mg daily.  AKI could be due to COVID infection. Serum creatinine up to 1.9 from 1.5 in feb.2022, Continue to monitor renal functions, avoid nephrotoxic medications.  Hypertension: Continue Coreg and other home medications.  Pulmonary nodules: unchanged from the prior.:  DVT prophylaxis: Heparin subcu Code Status: Full  code Family Communication: No family at bedside Disposition Plan:   Status is: Observation  The patient remains OBS appropriate and will d/c before 2 midnights.  Dispo: The patient is from: Home              Anticipated d/c is to: Home              Patient currently is not medically stable to d/c.   Difficult to place patient No   Consultants:  None  Procedures:  None Antimicrobials:   Anti-infectives (From admission, onward)    Start     Dose/Rate Route Frequency Ordered Stop   03/06/21 0900  sulfamethoxazole-trimethoprim (BACTRIM) 200-40 MG/5ML suspension 10 mL       Note to Pharmacy: M- W- F     10 mL Oral Once per day on Mon Wed Fri 03/04/21 1946          Subjective: Patient was seen and examined at bedside.  Overnight events noted.  Patient was having breakfast.  He denies any shortness of breath, reports chest pain is improving.  Objective: Vitals:   03/05/21 0800 03/05/21 0845 03/05/21 0900 03/05/21 1000  BP: 129/77  124/68 113/72  Pulse: 81  79 76  Resp: (!) 21  18 (!) 23  Temp:  100.1 F (37.8 C)    TempSrc:  Oral    SpO2: 95%  96% 95%  Weight:      Height:       No intake or output data in the 24 hours ending 03/05/21 1107 Filed Weights   03/05/21 0739  Weight: 107.5 kg    Examination:  General exam: Appears calm and comfortable, not in acute distress. Respiratory system: Clear to auscultation, respiratory effort normal.  Respiratory rate 16 Cardiovascular system: S1 & S2 heard,  RRR. No JVD, murmurs, rubs, gallops or clicks. No pedal edema. Gastrointestinal system: Abdomen is nondistended, soft and nontender. No organomegaly or masses felt. Normal bowel sounds heard. Central nervous system: Alert and oriented X 3. No focal neurological deficits. Extremities: No edema, no cyanosis, no clubbing. Skin: No rashes, lesions or ulcers Psychiatry: Judgement and insight appear normal. Mood & affect appropriate.     Data Reviewed: I have personally  reviewed following labs and imaging studies  CBC: Recent Labs  Lab 03/04/21 1313 03/05/21 0730  WBC 4.1 5.3  NEUTROABS  --  4.2  HGB 12.9* 13.5  HCT 41.2 43.3  MCV 85.8 86.9  PLT 114* 106*   Basic Metabolic Panel: Recent Labs  Lab 03/04/21 1313 03/05/21 0730  NA 136 138  K 3.6 3.5  CL 105 106  CO2 24 21*  GLUCOSE 153* 112*  BUN 12 11  CREATININE 1.93* 1.46*  CALCIUM 8.6* 8.7*   GFR: Estimated Creatinine Clearance: 66.5 mL/min (A) (by C-G formula based on SCr of 1.46 mg/dL (H)). Liver Function Tests: Recent Labs  Lab 03/05/21 0730  AST 24  ALT 15  ALKPHOS 65  BILITOT 1.0  PROT 6.3*  ALBUMIN 3.2*   Recent Labs  Lab 03/04/21 1313  LIPASE 21   No results for input(s): AMMONIA in the last 168 hours. Coagulation Profile: No results for input(s): INR, PROTIME in the last 168 hours. Cardiac Enzymes: No results for input(s): CKTOTAL, CKMB, CKMBINDEX, TROPONINI in the last 168 hours. BNP (last 3 results) No results for input(s): PROBNP in the last 8760 hours. HbA1C: No results for input(s): HGBA1C in the last 72 hours. CBG: No results for input(s): GLUCAP in the last 168 hours. Lipid Profile: No results for input(s): CHOL, HDL, LDLCALC, TRIG, CHOLHDL, LDLDIRECT in the last 72 hours. Thyroid Function Tests: No results for input(s): TSH, T4TOTAL, FREET4, T3FREE, THYROIDAB in the last 72 hours. Anemia Panel: No results for input(s): VITAMINB12, FOLATE, FERRITIN, TIBC, IRON, RETICCTPCT in the last 72 hours. Sepsis Labs: Recent Labs  Lab 03/05/21 0730  PROCALCITON <0.10    Recent Results (from the past 240 hour(s))  Resp Panel by RT-PCR (Flu A&B, Covid) Nasopharyngeal Swab     Status: Abnormal   Collection Time: 03/04/21  5:49 PM   Specimen: Nasopharyngeal Swab; Nasopharyngeal(NP) swabs in vial transport medium  Result Value Ref Range Status   SARS Coronavirus 2 by RT PCR POSITIVE (A) NEGATIVE Final    Comment: RESULT CALLED TO, READ BACK BY AND VERIFIED  WITH: RN C.MAS ON [id number] AT 2045 BY E.PARRISH (NOTE) SARS-CoV-2 target nucleic acids are DETECTED.  The SARS-CoV-2 RNA is generally detectable in upper respiratory specimens during the acute phase of infection. Positive results are indicative of the presence of the identified virus, but do not rule out bacterial infection or co-infection with other pathogens not detected by the test. Clinical correlation with patient history and other diagnostic information is necessary to determine patient infection status. The expected result is Negative.  Fact Sheet for Patients: BloggerCourse.com  Fact Sheet for Healthcare Providers: SeriousBroker.it  This test is not yet approved or cleared by the [place] FDA and  has been authorized for detection and/or diagnosis of SARS-CoV-2 by FDA under an Emergency Use Authorization (EUA).  This EUA will remain in effect (meaning this tes t can be used) for the duration of  the COVID-19 declaration under Section 564(b)(1) of the Act, 21 U.S.C. section 360bbb-3(b)(1), unless the authorization is terminated or revoked sooner.  Influenza A by PCR NEGATIVE NEGATIVE Final   Influenza B by PCR NEGATIVE NEGATIVE Final    Comment: (NOTE) The Xpert Xpress SARS-CoV-2/FLU/RSV plus assay is intended as an aid in the diagnosis of influenza from Nasopharyngeal swab specimens and should not be used as a sole basis for treatment. Nasal washings and aspirates are unacceptable for Xpert Xpress SARS-CoV-2/FLU/RSV testing.  Fact Sheet for Patients: BloggerCourse.com  Fact Sheet for Healthcare Providers: SeriousBroker.it  This test is not yet approved or cleared by the [place] FDA and has been authorized for detection and/or diagnosis of SARS-CoV-2 by FDA under an Emergency Use Authorization (EUA). This EUA will remain in effect (meaning this test can  be used) for the duration of the COVID-19 declaration under Section 564(b)(1) of the Act, 21 U.S.C. section 360bbb-3(b)(1), unless the authorization is terminated or revoked.  Performed at [facility], [address], [place], [place] [address]     Radiology Studies: DG Chest 2 View  Result Date: 03/04/2021 CLINICAL DATA:  Chest and back pain. EXAM: CHEST - 2 VIEW COMPARISON:  02/22/2017 FINDINGS: Prominent central vascular structures are similar to the previous examination. No overt pulmonary edema or focal airspace disease. Heart size is normal. No large pleural effusions. No acute bone abnormality. Negative for a pneumothorax. IMPRESSION: No active cardiopulmonary disease. Electronically Signed   By: Richarda Overlie M.D.   On: 03/04/2021 13:36   CT Angio Chest/Abd/Pel for Dissection W and/or Wo Contrast  Result Date: 03/04/2021 CLINICAL DATA:  Chest pain or back pain, aortic dissection suspected EXAM: CT ANGIOGRAPHY CHEST, ABDOMEN AND PELVIS TECHNIQUE: Non-contrast CT of the chest was initially obtained. Multidetector CT imaging through the chest, abdomen and pelvis was performed using the standard protocol during bolus administration of intravenous contrast. Multiplanar reconstructed images and MIPs were obtained and reviewed to evaluate the vascular anatomy. CONTRAST:  65mL OMNIPAQUE IOHEXOL 350 MG/ML SOLN COMPARISON:  Chest CT May 08, 2019 and CT abdomen pelvis April 17, 2019. FINDINGS: CTA CHEST FINDINGS Cardiovascular: No evidence of intramural hematoma on non contrasted series. Aortic atherosclerosis. Preferential opacification of the thoracic aorta. No evidence of thoracic aortic aneurysm or dissection. Normal heart size. Trace pericardial effusion, likely physiologic. Three-vessel coronary artery calcifications. Calcifications of the mitral annulus and aortic valve. Mediastinum/Nodes: No enlarged mediastinal, hilar, or axillary lymph nodes. Thyroid gland, trachea, and esophagus  demonstrate no significant findings. Lungs/Pleura: Bilateral diffuse multifocal peripheral predominant ground-glass opacities. Similar appearance of the right middle lobe pulmonary nodules, some of which are in tree-in-bud morphology, with the largest nodule measuring 9 x 5 mm, mean of 7 mm and unchanged. No pleural effusion. No pneumothorax. Musculoskeletal: No acute osseous abnormality. Review of the MIP images confirms the above findings. CTA ABDOMEN AND PELVIS FINDINGS VASCULAR Aorta: Aortic atherosclerosis. Normal caliber aorta without aneurysm, dissection, vasculitis or significant stenosis. Celiac: Patent without evidence of aneurysm, dissection, vasculitis or significant stenosis. SMA: Patent without evidence of aneurysm, dissection, vasculitis or significant stenosis. Renals: Both renal arteries are patent. Right lower quadrant transplant kidney artery extends from the external iliac artery and appears patent without evidence of aneurysm or significant stenosis. IMA: Patent without evidence of aneurysm, dissection, vasculitis or significant stenosis. Inflow: Patent without evidence of aneurysm, dissection, vasculitis or significant stenosis. Veins: No obvious venous abnormality within the limitations of this arterial phase study. Review of the MIP images confirms the above findings. NON-VASCULAR Hepatobiliary: No suspicious hepatic lesion. Gallbladder is distended with stones/sludge in the gallbladder neck, without evidence of wall  thickening or pericholecystic fluid. No biliary ductal dilation. Pancreas: Within normal limits. Spleen: Within normal limits. Adrenals/Urinary Tract: Bilateral adrenal glands are unremarkable. The native kidneys are atrophic with nonobstructive nephrolithiasis. Enhancing 9 mm right upper pole renal lesion on image 150/8 hypodense bilateral renal lesions, incompletely characterized on this study but statistically likely to represent cysts. Right lower quadrant transplant kidney  without hydronephrosis nephrolithiasis or solid enhancing renal mass. 6 mm stone in the bladder, unchanged from prior. Stomach/Bowel: Small hiatal hernia otherwise the stomach is unremarkable. No pathologic dilation of small bowel. Anastomotic sutures in the anterior left abdomen and right lower quadrant. Colonic diverticulosis without findings of acute diverticulitis. Lymphatic: No pathologically enlarged abdominal or pelvic lymph nodes. Reproductive: Prostate is unremarkable. Other: No abdominopelvic free fluid. Musculoskeletal: Multilevel degenerative changes spine. No acute osseous abnormality. Review of the MIP images confirms the above findings. IMPRESSION: 1. No evidence of thoracic or abdominal aortic aneurysm or dissection. 2. Bilateral diffuse multifocal peripheral predominant ground-glass opacities, suspicious for an atypical infectious or inflammatory process. 3. Similar appearance of the right middle lobe pulmonary nodules, some of which are in tree-in-bud morphology, with the largest nodule measuring 7 mm and unchanged. Most consistent with atypical infection, possibly atypical mycobacterium. 4. Distended gallbladder with stones/sludge in the gallbladder neck, without evidence of wall thickening or pericholecystic fluid. If there is concern for acute cholecystitis, recommend right upper quadrant ultrasound. 5. Atrophic native kidneys with a right lower quadrant transplant kidney. 6. Enhancing 9 mm right upper pole renal lesion, suspicious for a small renal cell carcinoma. Additional hypodense tiny bilateral renal lesions which are incompletely characterized on this study but statistically likely to represent cysts. Nonemergent urology consult suggested. 7. Similar 6 mm bladder stone. 8. Colonic diverticulosis without findings of acute diverticulitis. 9.  Aortic Atherosclerosis (ICD10-I70.0). Electronically Signed   By: Maudry Mayhew MD   On: 03/04/2021 16:16    Scheduled Meds:  aspirin EC  81 mg  Oral Daily   atorvastatin  20 mg Oral Daily   timolol  1 drop Both Eyes BID   And   brimonidine  1 drop Both Eyes BID   carvedilol  25 mg Oral BID WC   heparin  5,000 Units Subcutaneous Q8H   Latanoprostene Bunod  1 drop Both Eyes QHS   magnesium oxide  800 mg Oral BID   mycophenolate  1,000 mg Oral BID   potassium & sodium phosphates  2 packet Oral TID   predniSONE  15 mg Oral Daily   [START ON 03/06/2021] sulfamethoxazole-trimethoprim  10 mL Oral Once per day on Mon Wed Fri   tacrolimus  4 mg Oral BID   Continuous Infusions:  sodium chloride     famotidine (PEPCID) IV       LOS: 0 days    Time spent: 35 mins    Melayna Robarts, MD [facility]   If 7PM-7AM, please contact night-coverage

## 2021-03-06 DIAGNOSIS — U071 COVID-19: Secondary | ICD-10-CM | POA: Diagnosis not present

## 2021-03-06 LAB — COMPREHENSIVE METABOLIC PANEL
ALT: 15 U/L (ref 0–44)
AST: 20 U/L (ref 15–41)
Albumin: 3.1 g/dL — ABNORMAL LOW (ref 3.5–5.0)
Alkaline Phosphatase: 62 U/L (ref 38–126)
Anion gap: 9 (ref 5–15)
BUN: 20 mg/dL (ref 8–23)
CO2: 23 mmol/L (ref 22–32)
Calcium: 9.2 mg/dL (ref 8.9–10.3)
Chloride: 103 mmol/L (ref 98–111)
Creatinine, Ser: 1.56 mg/dL — ABNORMAL HIGH (ref 0.61–1.24)
GFR, Estimated: 50 mL/min — ABNORMAL LOW (ref >60)
Glucose, Bld: 140 mg/dL — ABNORMAL HIGH (ref 70–99)
Potassium: 4.2 mmol/L (ref 3.5–5.1)
Sodium: 135 mmol/L (ref 135–145)
Total Bilirubin: 0.7 mg/dL (ref 0.3–1.2)
Total Protein: 6.2 g/dL — ABNORMAL LOW (ref 6.5–8.1)

## 2021-03-06 LAB — CBC WITH DIFFERENTIAL/PLATELET
Abs Immature Granulocytes: 0.05 10*3/uL (ref 0.00–0.07)
Basophils Absolute: 0 10*3/uL (ref 0.0–0.1)
Basophils Relative: 0 %
Eosinophils Absolute: 0 10*3/uL (ref 0.0–0.5)
Eosinophils Relative: 0 %
HCT: 40 % (ref 39.0–52.0)
Hemoglobin: 13 g/dL (ref 13.0–17.0)
Immature Granulocytes: 1 %
Lymphocytes Relative: 14 %
Lymphs Abs: 0.7 10*3/uL (ref 0.7–4.0)
MCH: 27.3 pg (ref 26.0–34.0)
MCHC: 32.5 g/dL (ref 30.0–36.0)
MCV: 84 fL (ref 80.0–100.0)
Monocytes Absolute: 0.5 10*3/uL (ref 0.1–1.0)
Monocytes Relative: 11 %
Neutro Abs: 3.5 10*3/uL (ref 1.7–7.7)
Neutrophils Relative %: 74 %
Platelets: 114 10*3/uL — ABNORMAL LOW (ref 150–400)
RBC: 4.76 MIL/uL (ref 4.22–5.81)
RDW: 13.4 % (ref 11.5–15.5)
WBC: 4.7 10*3/uL (ref 4.0–10.5)
nRBC: 0 % (ref 0.0–0.2)

## 2021-03-06 LAB — C-REACTIVE PROTEIN: CRP: 3.9 mg/dL — ABNORMAL HIGH (ref <1.0)

## 2021-03-06 LAB — D-DIMER, QUANTITATIVE: D-Dimer, Quant: 0.33 ug/mL-FEU (ref 0.00–0.50)

## 2021-03-06 NOTE — Progress Notes (Signed)
PROGRESS NOTE    Jon [name]  DJM:[id number] DOB: 1955-09-19 DOA: 03/04/2021 PCP: Eulis Foster, MD    Brief Narrative:  This 65 years old male with PMH significant for HTN, hyperlipidemia, prior end-stage renal disease, s/p renal transplant, patient is on immunosuppressants for renal transplant.  Patient had history of MI but has very unimpressive left heart cath in 2016. Patient is unvaccinated against COVID-19.  Patient presented in the ED with 1 day history of sudden onset of chest pain associated with shortness of breath.  He describes chest pain is substernal radiating towards the back, getting worse with coughing, deep breathing.  Patient has tried nitroglycerin without any significant relief.  CTA showed peripheral groundglass opacities consistent with atypical infection.  COVID test positive.  Assessment & Plan:   Principal Problem:   COVID-19 virus infection Active Problems:   HTN (hypertension)   Kidney transplant recipient   Pulmonary nodule   Acute-on-chronic kidney injury ([place])   Chest pain / COVID-19 infection: Patient presented with chest pain associated with shortness of breath. He is not hypoxic, no oxygen requirement but at high risk for severe disease. Patient is unvaccinated against COVID, renal transplant patient on multiple immunosuppressants. Continue monoclonal antibody therapy in the ED. Continue airborne precautions, supportive care. Continue pulse oximetry. Monitor inflammatory markers, CRP, procalcitonin, D-dimer. He reports chest pain is improving.  Kidney transplant: Patient is on multiple immunosuppressant medication. Continue above meds.  Increase prednisone dose to  15 mg daily.  AKI could be due to COVID infection. Serum creatinine up to 1.9 from 1.5 in feb.2022, Continue to monitor renal functions, avoid nephrotoxic medications. Renal functions backing into normal.  Hypertension: Continue Coreg and other home  medications.  Pulmonary nodules: unchanged from the prior.  DVT prophylaxis: Heparin subcu Code Status: Full code Family Communication: No family at bedside Disposition Plan:   Status is: Observation  The patient remains OBS appropriate and will d/c before 2 midnights.  Dispo: The patient is from: Home              Anticipated d/c is to: Home              Patient currently is not medically stable to d/c.   Difficult to place patient No  Consultants:  None  Procedures:  None Antimicrobials:   Anti-infectives (From admission, onward)    Start     Dose/Rate Route Frequency Ordered Stop   03/06/21 0900  sulfamethoxazole-trimethoprim (BACTRIM) 200-40 MG/5ML suspension 10 mL       Note to Pharmacy: M- W- F     10 mL Oral Once per day on Mon Wed Fri 03/04/21 1946          Subjective: Patient was seen and examined at bedside.  Overnight events noted.   He reports feeling better, still reports having chest pain but denies any shortness of breath and cough.    Objective: Vitals:   03/05/21 2346 03/06/21 0400 03/06/21 0759 03/06/21 1239  BP: (!) 137/93 135/81 (!) 152/89 (!) 147/94  Pulse: 69 60 66 64  Resp: 16 19 18 19   Temp: 98.4 F (36.9 C) 98.4 F (36.9 C) 98 F (36.7 C) 98.2 F (36.8 C)  TempSrc: Oral Oral Oral Oral  SpO2: 95% 91% 95% 94%  Weight:      Height:        Intake/Output Summary (Last 24 hours) at 03/06/2021 1310 Last data filed at 03/06/2021 1100 Gross per 24 hour  Intake 240 ml  Output --  Net 240 ml   Filed Weights   03/05/21 0739  Weight: 107.5 kg    Examination:  General exam: Appears calm and comfortable, not in any acute distress.  Lying comfortably. Respiratory system: Clear to auscultation, respiratory effort normal.  Respiratory rate 16 Cardiovascular system: S1 & S2 heard, RRR. No JVD, murmurs, rubs, gallops or clicks. No pedal edema. Gastrointestinal system: Abdomen is nondistended, soft and nontender. No organomegaly or masses felt.  Normal bowel sounds heard. Central nervous system: Alert and oriented X 3. No focal neurological deficits. Extremities: No edema, no cyanosis, no clubbing. Skin: No rashes, lesions or ulcers Psychiatry: Judgement and insight appear normal. Mood & affect appropriate.     Data Reviewed: I have personally reviewed following labs and imaging studies  CBC: Recent Labs  Lab 03/04/21 1313 03/05/21 0730 03/06/21 0236  WBC 4.1 5.3 4.7  NEUTROABS  --  4.2 3.5  HGB 12.9* 13.5 13.0  HCT 41.2 43.3 40.0  MCV 85.8 86.9 84.0  PLT 114* 106* 614*   Basic Metabolic Panel: Recent Labs  Lab 03/04/21 1313 03/05/21 0730 03/06/21 0236  NA 136 138 135  K 3.6 3.5 4.2  CL 105 106 103  CO2 24 21* 23  GLUCOSE 153* 112* 140*  BUN 12 11 20   CREATININE 1.93* 1.46* 1.56*  CALCIUM 8.6* 8.7* 9.2   GFR: Estimated Creatinine Clearance: 62.2 mL/min (A) (by C-G formula based on SCr of 1.56 mg/dL (H)). Liver Function Tests: Recent Labs  Lab 03/05/21 0730 03/06/21 0236  AST 24 20  ALT 15 15  ALKPHOS 65 62  BILITOT 1.0 0.7  PROT 6.3* 6.2*  ALBUMIN 3.2* 3.1*   Recent Labs  Lab 03/04/21 1313  LIPASE 21   No results for input(s): AMMONIA in the last 168 hours. Coagulation Profile: No results for input(s): INR, PROTIME in the last 168 hours. Cardiac Enzymes: No results for input(s): CKTOTAL, CKMB, CKMBINDEX, TROPONINI in the last 168 hours. BNP (last 3 results) No results for input(s): PROBNP in the last 8760 hours. HbA1C: No results for input(s): HGBA1C in the last 72 hours. CBG: No results for input(s): GLUCAP in the last 168 hours. Lipid Profile: No results for input(s): CHOL, HDL, LDLCALC, TRIG, CHOLHDL, LDLDIRECT in the last 72 hours. Thyroid Function Tests: No results for input(s): TSH, T4TOTAL, FREET4, T3FREE, THYROIDAB in the last 72 hours. Anemia Panel: No results for input(s): VITAMINB12, FOLATE, FERRITIN, TIBC, IRON, RETICCTPCT in the last 72 hours. Sepsis Labs: Recent Labs   Lab 03/05/21 0730  PROCALCITON <0.10    Recent Results (from the past 240 hour(s))  Resp Panel by RT-PCR (Flu A&B, Covid) Nasopharyngeal Swab     Status: Abnormal   Collection Time: 03/04/21  5:49 PM   Specimen: Nasopharyngeal Swab; Nasopharyngeal(NP) swabs in vial transport medium  Result Value Ref Range Status   SARS Coronavirus 2 by RT PCR POSITIVE (A) NEGATIVE Final    Comment: RESULT CALLED TO, READ BACK BY AND VERIFIED WITH: RN C.MAS ON [id number] AT 2045 BY E.PARRISH (NOTE) SARS-CoV-2 target nucleic acids are DETECTED.  The SARS-CoV-2 RNA is generally detectable in upper respiratory specimens during the acute phase of infection. Positive results are indicative of the presence of the identified virus, but do not rule out bacterial infection or co-infection with other pathogens not detected by the test. Clinical correlation with patient history and other diagnostic information is necessary to determine patient infection status. The expected result is Negative.  Fact Sheet for Patients: EntrepreneurPulse.com.au  Fact Sheet  for Healthcare Providers: IncredibleEmployment.be  This test is not yet approved or cleared by the [place] and  has been authorized for detection and/or diagnosis of SARS-CoV-2 by FDA under an Emergency Use Authorization (EUA).  This EUA will remain in effect (meaning this tes t can be used) for the duration of  the COVID-19 declaration under Section 564(b)(1) of the Act, 21 U.S.C. section 360bbb-3(b)(1), unless the authorization is terminated or revoked sooner.     Influenza A by PCR NEGATIVE NEGATIVE Final   Influenza B by PCR NEGATIVE NEGATIVE Final    Comment: (NOTE) The Xpert Xpress SARS-CoV-2/FLU/RSV plus assay is intended as an aid in the diagnosis of influenza from Nasopharyngeal swab specimens and should not be used as a sole basis for treatment. Nasal washings and aspirates are unacceptable for  Xpert Xpress SARS-CoV-2/FLU/RSV testing.  Fact Sheet for Patients: EntrepreneurPulse.com.au  Fact Sheet for Healthcare Providers: IncredibleEmployment.be  This test is not yet approved or cleared by the [place] FDA and has been authorized for detection and/or diagnosis of SARS-CoV-2 by FDA under an Emergency Use Authorization (EUA). This EUA will remain in effect (meaning this test can be used) for the duration of the COVID-19 declaration under Section 564(b)(1) of the Act, 21 U.S.C. section 360bbb-3(b)(1), unless the authorization is terminated or revoked.  Performed at [facility], [place] [address] [place], [place] [address]     Radiology Studies: DG Chest 2 View  Result Date: 03/04/2021 CLINICAL DATA:  Chest and back pain. EXAM: CHEST - 2 VIEW COMPARISON:  02/22/2017 FINDINGS: Prominent central vascular structures are similar to the previous examination. No overt pulmonary edema or focal airspace disease. Heart size is normal. No large pleural effusions. No acute bone abnormality. Negative for a pneumothorax. IMPRESSION: No active cardiopulmonary disease. Electronically Signed   By: Markus Daft M.D.   On: 03/04/2021 13:36   CT Angio Chest/Abd/Pel for Dissection W and/or Wo Contrast  Result Date: 03/04/2021 CLINICAL DATA:  Chest pain or back pain, aortic dissection suspected EXAM: CT ANGIOGRAPHY CHEST, ABDOMEN AND PELVIS TECHNIQUE: Non-contrast CT of the chest was initially obtained. Multidetector CT imaging through the chest, abdomen and pelvis was performed using the standard protocol during bolus administration of intravenous contrast. Multiplanar reconstructed images and MIPs were obtained and reviewed to evaluate the vascular anatomy. CONTRAST:  24mL OMNIPAQUE IOHEXOL 350 MG/ML SOLN COMPARISON:  Chest CT May 08, 2019 and CT abdomen pelvis April 17, 2019. FINDINGS: CTA CHEST FINDINGS Cardiovascular: No evidence of intramural  hematoma on non contrasted series. Aortic atherosclerosis. Preferential opacification of the thoracic aorta. No evidence of thoracic aortic aneurysm or dissection. Normal heart size. Trace pericardial effusion, likely physiologic. Three-vessel coronary artery calcifications. Calcifications of the mitral annulus and aortic valve. Mediastinum/Nodes: No enlarged mediastinal, hilar, or axillary lymph nodes. Thyroid gland, trachea, and esophagus demonstrate no significant findings. Lungs/Pleura: Bilateral diffuse multifocal peripheral predominant ground-glass opacities. Similar appearance of the right middle lobe pulmonary nodules, some of which are in tree-in-bud morphology, with the largest nodule measuring 9 x 5 mm, mean of 7 mm and unchanged. No pleural effusion. No pneumothorax. Musculoskeletal: No acute osseous abnormality. Review of the MIP images confirms the above findings. CTA ABDOMEN AND PELVIS FINDINGS VASCULAR Aorta: Aortic atherosclerosis. Normal caliber aorta without aneurysm, dissection, vasculitis or significant stenosis. Celiac: Patent without evidence of aneurysm, dissection, vasculitis or significant stenosis. SMA: Patent without evidence of aneurysm, dissection, vasculitis or significant stenosis. Renals: Both renal arteries are patent. Right lower quadrant transplant  kidney artery extends from the external iliac artery and appears patent without evidence of aneurysm or significant stenosis. IMA: Patent without evidence of aneurysm, dissection, vasculitis or significant stenosis. Inflow: Patent without evidence of aneurysm, dissection, vasculitis or significant stenosis. Veins: No obvious venous abnormality within the limitations of this arterial phase study. Review of the MIP images confirms the above findings. NON-VASCULAR Hepatobiliary: No suspicious hepatic lesion. Gallbladder is distended with stones/sludge in the gallbladder neck, without evidence of wall thickening or pericholecystic fluid. No  biliary ductal dilation. Pancreas: Within normal limits. Spleen: Within normal limits. Adrenals/Urinary Tract: Bilateral adrenal glands are unremarkable. The native kidneys are atrophic with nonobstructive nephrolithiasis. Enhancing 9 mm right upper pole renal lesion on image 150/8 hypodense bilateral renal lesions, incompletely characterized on this study but statistically likely to represent cysts. Right lower quadrant transplant kidney without hydronephrosis nephrolithiasis or solid enhancing renal mass. 6 mm stone in the bladder, unchanged from prior. Stomach/Bowel: Small hiatal hernia otherwise the stomach is unremarkable. No pathologic dilation of small bowel. Anastomotic sutures in the anterior left abdomen and right lower quadrant. Colonic diverticulosis without findings of acute diverticulitis. Lymphatic: No pathologically enlarged abdominal or pelvic lymph nodes. Reproductive: Prostate is unremarkable. Other: No abdominopelvic free fluid. Musculoskeletal: Multilevel degenerative changes spine. No acute osseous abnormality. Review of the MIP images confirms the above findings. IMPRESSION: 1. No evidence of thoracic or abdominal aortic aneurysm or dissection. 2. Bilateral diffuse multifocal peripheral predominant ground-glass opacities, suspicious for an atypical infectious or inflammatory process. 3. Similar appearance of the right middle lobe pulmonary nodules, some of which are in tree-in-bud morphology, with the largest nodule measuring 7 mm and unchanged. Most consistent with atypical infection, possibly atypical mycobacterium. 4. Distended gallbladder with stones/sludge in the gallbladder neck, without evidence of wall thickening or pericholecystic fluid. If there is concern for acute cholecystitis, recommend right upper quadrant ultrasound. 5. Atrophic native kidneys with a right lower quadrant transplant kidney. 6. Enhancing 9 mm right upper pole renal lesion, suspicious for a small renal cell  carcinoma. Additional hypodense tiny bilateral renal lesions which are incompletely characterized on this study but statistically likely to represent cysts. Nonemergent urology consult suggested. 7. Similar 6 mm bladder stone. 8. Colonic diverticulosis without findings of acute diverticulitis. 9.  Aortic Atherosclerosis (ICD10-I70.0). Electronically Signed   By: Dahlia Bailiff MD   On: 03/04/2021 16:16    Scheduled Meds:  aspirin EC  81 mg Oral Daily   atorvastatin  20 mg Oral Daily   timolol  1 drop Both Eyes BID   And   brimonidine  1 drop Both Eyes BID   carvedilol  25 mg Oral BID WC   heparin  5,000 Units Subcutaneous Q8H   magnesium oxide  800 mg Oral BID   mycophenolate  1,000 mg Oral BID   potassium & sodium phosphates  2 packet Oral TID   predniSONE  15 mg Oral Daily   sulfamethoxazole-trimethoprim  10 mL Oral Once per day on Mon Wed Fri   tacrolimus  4 mg Oral BID   Continuous Infusions:     LOS: 0 days    Time spent: 25 mins    Shawna Clamp, MD [facility]   If 7PM-7AM, please contact night-coverage

## 2021-03-06 NOTE — Care Management Obs Status (Signed)
[place] NOTIFICATION   Patient Details  Name: Jon [name] MRN: [id number] Date of Birth: [birth year]   Medicare Observation Status Notification Given:  Yes    Benard Halsted, LCSW 03/06/2021, 10:17 AM

## 2021-03-06 NOTE — Plan of Care (Signed)
  Problem: Education: Goal: Knowledge of General Education information will improve Description: Including pain rating scale, medication(s)/side effects and non-pharmacologic comfort measures Outcome: Progressing   Problem: Health Behavior/Discharge Planning: Goal: Ability to manage health-related needs will improve Outcome: Progressing   Problem: Clinical Measurements: Goal: Ability to maintain clinical measurements within normal limits will improve Outcome: Progressing Goal: Diagnostic test results will improve Outcome: Progressing Goal: Respiratory complications will improve Outcome: Progressing Goal: Cardiovascular complication will be avoided Outcome: Progressing   Problem: Activity: Goal: Risk for activity intolerance will decrease Outcome: Progressing   Problem: Coping: Goal: Level of anxiety will decrease Outcome: Progressing   Problem: Elimination: Goal: Will not experience complications related to urinary retention Outcome: Progressing   Problem: Pain Managment: Goal: General experience of comfort will improve Outcome: Progressing   Problem: Safety: Goal: Ability to remain free from injury will improve Outcome: Progressing   Problem: Skin Integrity: Goal: Risk for impaired skin integrity will decrease Outcome: Progressing

## 2021-03-07 ENCOUNTER — Encounter (HOSPITAL_COMMUNITY): Payer: Self-pay | Admitting: Internal Medicine

## 2021-03-07 DIAGNOSIS — U071 COVID-19: Secondary | ICD-10-CM | POA: Diagnosis not present

## 2021-03-07 LAB — COMPREHENSIVE METABOLIC PANEL
ALT: 19 U/L (ref 0–44)
AST: 23 U/L (ref 15–41)
Albumin: 3 g/dL — ABNORMAL LOW (ref 3.5–5.0)
Alkaline Phosphatase: 63 U/L (ref 38–126)
Anion gap: 6 (ref 5–15)
BUN: 19 mg/dL (ref 8–23)
CO2: 25 mmol/L (ref 22–32)
Calcium: 9 mg/dL (ref 8.9–10.3)
Chloride: 104 mmol/L (ref 98–111)
Creatinine, Ser: 1.56 mg/dL — ABNORMAL HIGH (ref 0.61–1.24)
GFR, Estimated: 50 mL/min — ABNORMAL LOW (ref >60)
Glucose, Bld: 149 mg/dL — ABNORMAL HIGH (ref 70–99)
Potassium: 4.2 mmol/L (ref 3.5–5.1)
Sodium: 135 mmol/L (ref 135–145)
Total Bilirubin: 0.6 mg/dL (ref 0.3–1.2)
Total Protein: 6.1 g/dL — ABNORMAL LOW (ref 6.5–8.1)

## 2021-03-07 LAB — C-REACTIVE PROTEIN: CRP: 1.3 mg/dL — ABNORMAL HIGH (ref <1.0)

## 2021-03-07 LAB — CBC WITH DIFFERENTIAL/PLATELET
Abs Immature Granulocytes: 0.04 10*3/uL (ref 0.00–0.07)
Basophils Absolute: 0 10*3/uL (ref 0.0–0.1)
Basophils Relative: 0 %
Eosinophils Absolute: 0 10*3/uL (ref 0.0–0.5)
Eosinophils Relative: 0 %
HCT: 41.6 % (ref 39.0–52.0)
Hemoglobin: 13.2 g/dL (ref 13.0–17.0)
Immature Granulocytes: 1 %
Lymphocytes Relative: 15 %
Lymphs Abs: 0.7 10*3/uL (ref 0.7–4.0)
MCH: 27.6 pg (ref 26.0–34.0)
MCHC: 31.7 g/dL (ref 30.0–36.0)
MCV: 86.8 fL (ref 80.0–100.0)
Monocytes Absolute: 0.4 10*3/uL (ref 0.1–1.0)
Monocytes Relative: 9 %
Neutro Abs: 3.4 10*3/uL (ref 1.7–7.7)
Neutrophils Relative %: 75 %
Platelets: 118 10*3/uL — ABNORMAL LOW (ref 150–400)
RBC: 4.79 MIL/uL (ref 4.22–5.81)
RDW: 13.3 % (ref 11.5–15.5)
WBC: 4.5 10*3/uL (ref 4.0–10.5)
nRBC: 0 % (ref 0.0–0.2)

## 2021-03-07 LAB — D-DIMER, QUANTITATIVE: D-Dimer, Quant: 0.45 ug/mL-FEU (ref 0.00–0.50)

## 2021-03-07 NOTE — Discharge Summary (Addendum)
Physician Discharge Summary  Jon [name] UEA:[id number] DOB: 10/15/1955 DOA: 03/04/2021  PCP: Eulis Foster, MD  Admit date: 03/04/2021  Discharge date: 03/07/2021  Admitted From: Home.  Disposition:Home.  Recommendations for Outpatient Follow-up:  Follow up with PCP in 1-2 weeks. Please obtain BMP/CBC in one week. Advised to remain in quarantine for 7 to 10 days. Patient still refuses COVID-vaccine.   Home Health: None Equipment/Devices:None  Discharge Condition: Good CODE STATUS:Full code Diet recommendation: Heart Healthy   Brief Summary / Hospital Course: This 65 years old male with PMH significant for HTN, hyperlipidemia, prior end-stage renal disease, s/p renal transplant, patient is on immunosuppressants for renal transplant.  Patient had history of MI but has very unimpressive left heart cath in 2016. Patient is unvaccinated against COVID-19.  Patient presented in the ED with 1 day history of sudden onset of chest pain associated with shortness of breath.  He describes chest pain is substernal radiating towards the back, getting worse with coughing, deep breathing.  Patient has tried nitroglycerin without any significant relief.  CTA showed peripheral groundglass opacities consistent with atypical infection.  COVID test positive. Patient was admitted for chest pain secondary to COVID infection.  Patient was not hypoxic, has not required oxygen but due to immunocompromised state.  Patient was admitted in the hospital and was given monoclonal antibody the ED.  Patient was explained about getting a COVID-vaccine , he has strictly refused.  Troponins slightly elevated but trended down could be due to demand ischaemia.  chest pain has resolved.  EKG has some changes but which were similar to the previous EKGs done 1 year ago.  Patient felt better and want to be discharged.  Patient is being discharged home  He was managed for below problems.   Discharge Diagnoses:  Principal  Problem:   COVID-19 virus infection Active Problems:   HTN (hypertension)   Kidney transplant recipient   Pulmonary nodule   Acute-on-chronic kidney injury ([place])  Chest pain / COVID-19 infection: Patient presented with chest pain associated with shortness of breath. He is not hypoxic, no oxygen requirement but at high risk for severe disease. Patient is unvaccinated against COVID, renal transplant patient on multiple immunosuppressants. He was given monoclonal antibody therapy in the ED. Continue airborne precautions, supportive care. Continue pulse oximetry. Monitor inflammatory markers, CRP, procalcitonin, D-dimer. He reports chest pain has resolved.   Kidney transplant: Patient is on multiple immunosuppressant medication. Continue above meds.  Increased prednisone dose to  15 mg daily.   AKI could be due to COVID infection. Serum creatinine up to 1.9 from 1.5 in feb.2022, Continue to monitor renal functions, avoid nephrotoxic medications. Renal functions back into normal.   Hypertension: Continue Coreg and other home medications.   Pulmonary nodules: unchanged from the prior.    Discharge Instructions  Discharge Instructions     Call MD for:  difficulty breathing, headache or visual disturbances   Complete by: As directed    Call MD for:  persistant dizziness or light-headedness   Complete by: As directed    Call MD for:  persistant nausea and vomiting   Complete by: As directed    Diet - low sodium heart healthy   Complete by: As directed    Diet Carb Modified   Complete by: As directed    Discharge instructions   Complete by: As directed    Advised to follow-up with primary care physician in 1 week. Advised to remain in quarantine for next 7 days.   Increase  activity slowly   Complete by: As directed       Allergies as of 03/07/2021   No Known Allergies      Medication List     TAKE these medications    albuterol 108 (90 Base) MCG/ACT  inhaler Commonly known as: VENTOLIN HFA Inhale 2 puffs into the lungs every 6 (six) hours as needed for wheezing or shortness of breath.   amLODipine 10 MG tablet Commonly known as: NORVASC Take 10 mg by mouth at bedtime.   aspirin 81 MG EC tablet Take 1 tablet (81 mg total) by mouth daily.   atorvastatin 20 MG tablet Commonly known as: LIPITOR Take 20 mg by mouth daily.   carvedilol 25 MG tablet Commonly known as: COREG Take 1 tablet (25 mg total) by mouth 2 (two) times daily with a meal.   Combigan 0.2-0.5 % ophthalmic solution Generic drug: brimonidine-timolol Place 1 drop into both eyes 2 (two) times daily.   hydrALAZINE 50 MG tablet Commonly known as: APRESOLINE Take 1 tablet (50 mg total) by mouth 3 (three) times daily.   magnesium oxide 400 MG tablet Commonly known as: MAG-OX Take 800 mg by mouth 2 (two) times a day. Take 2 hrs before or after Cellcept   mycophenolate 200 MG/ML suspension Commonly known as: CELLCEPT Take 5 mLs by mouth 2 (two) times a day.   Phos-NaK 280-160-250 MG Pack Generic drug: potassium & sodium phosphates Take 2 Packages by mouth 3 (three) times daily.   predniSONE 5 MG/5ML solution Take 5 mLs by mouth daily.   Prograf 1 MG capsule Generic drug: tacrolimus Take 4 mg by mouth 2 (two) times daily.   Sodium Bicarbonate Powd Take 5 mLs by mouth 2 (two) times a day. Baking soda   sulfamethoxazole-trimethoprim 200-40 MG/5ML suspension Commonly known as: BACTRIM Take 10 mLs by mouth 3 (three) times a week. M- W- F   traMADol 50 MG tablet Commonly known as: ULTRAM Take 1 tablet (50 mg total) by mouth every 6 (six) hours as needed for severe pain.   Vyzulta 0.024 % Soln Generic drug: Latanoprostene Bunod Place 1 drop into both eyes at bedtime.        Follow-up Information     Simmons-Robinson, Makiera, MD Follow up in 1 week(s).   Specialty: Family Medicine Contact information: [address]  [address] [phone]                No Known Allergies  Consultations: None   Procedures/Studies: DG Chest 2 View  Result Date: 03/04/2021 CLINICAL DATA:  Chest and back pain. EXAM: CHEST - 2 VIEW COMPARISON:  02/22/2017 FINDINGS: Prominent central vascular structures are similar to the previous examination. No overt pulmonary edema or focal airspace disease. Heart size is normal. No large pleural effusions. No acute bone abnormality. Negative for a pneumothorax. IMPRESSION: No active cardiopulmonary disease. Electronically Signed   By: Markus Daft M.D.   On: 03/04/2021 13:36   CT Angio Chest/Abd/Pel for Dissection W and/or Wo Contrast  Result Date: 03/04/2021 CLINICAL DATA:  Chest pain or back pain, aortic dissection suspected EXAM: CT ANGIOGRAPHY CHEST, ABDOMEN AND PELVIS TECHNIQUE: Non-contrast CT of the chest was initially obtained. Multidetector CT imaging through the chest, abdomen and pelvis was performed using the standard protocol during bolus administration of intravenous contrast. Multiplanar reconstructed images and MIPs were obtained and reviewed to evaluate the vascular anatomy. CONTRAST:  51mL OMNIPAQUE IOHEXOL 350 MG/ML SOLN COMPARISON:  Chest CT May 08, 2019  and CT abdomen pelvis April 17, 2019. FINDINGS: CTA CHEST FINDINGS Cardiovascular: No evidence of intramural hematoma on non contrasted series. Aortic atherosclerosis. Preferential opacification of the thoracic aorta. No evidence of thoracic aortic aneurysm or dissection. Normal heart size. Trace pericardial effusion, likely physiologic. Three-vessel coronary artery calcifications. Calcifications of the mitral annulus and aortic valve. Mediastinum/Nodes: No enlarged mediastinal, hilar, or axillary lymph nodes. Thyroid gland, trachea, and esophagus demonstrate no significant findings. Lungs/Pleura: Bilateral diffuse multifocal peripheral predominant ground-glass opacities. Similar appearance of the right middle lobe  pulmonary nodules, some of which are in tree-in-bud morphology, with the largest nodule measuring 9 x 5 mm, mean of 7 mm and unchanged. No pleural effusion. No pneumothorax. Musculoskeletal: No acute osseous abnormality. Review of the MIP images confirms the above findings. CTA ABDOMEN AND PELVIS FINDINGS VASCULAR Aorta: Aortic atherosclerosis. Normal caliber aorta without aneurysm, dissection, vasculitis or significant stenosis. Celiac: Patent without evidence of aneurysm, dissection, vasculitis or significant stenosis. SMA: Patent without evidence of aneurysm, dissection, vasculitis or significant stenosis. Renals: Both renal arteries are patent. Right lower quadrant transplant kidney artery extends from the external iliac artery and appears patent without evidence of aneurysm or significant stenosis. IMA: Patent without evidence of aneurysm, dissection, vasculitis or significant stenosis. Inflow: Patent without evidence of aneurysm, dissection, vasculitis or significant stenosis. Veins: No obvious venous abnormality within the limitations of this arterial phase study. Review of the MIP images confirms the above findings. NON-VASCULAR Hepatobiliary: No suspicious hepatic lesion. Gallbladder is distended with stones/sludge in the gallbladder neck, without evidence of wall thickening or pericholecystic fluid. No biliary ductal dilation. Pancreas: Within normal limits. Spleen: Within normal limits. Adrenals/Urinary Tract: Bilateral adrenal glands are unremarkable. The native kidneys are atrophic with nonobstructive nephrolithiasis. Enhancing 9 mm right upper pole renal lesion on image 150/8 hypodense bilateral renal lesions, incompletely characterized on this study but statistically likely to represent cysts. Right lower quadrant transplant kidney without hydronephrosis nephrolithiasis or solid enhancing renal mass. 6 mm stone in the bladder, unchanged from prior. Stomach/Bowel: Small hiatal hernia otherwise the  stomach is unremarkable. No pathologic dilation of small bowel. Anastomotic sutures in the anterior left abdomen and right lower quadrant. Colonic diverticulosis without findings of acute diverticulitis. Lymphatic: No pathologically enlarged abdominal or pelvic lymph nodes. Reproductive: Prostate is unremarkable. Other: No abdominopelvic free fluid. Musculoskeletal: Multilevel degenerative changes spine. No acute osseous abnormality. Review of the MIP images confirms the above findings. IMPRESSION: 1. No evidence of thoracic or abdominal aortic aneurysm or dissection. 2. Bilateral diffuse multifocal peripheral predominant ground-glass opacities, suspicious for an atypical infectious or inflammatory process. 3. Similar appearance of the right middle lobe pulmonary nodules, some of which are in tree-in-bud morphology, with the largest nodule measuring 7 mm and unchanged. Most consistent with atypical infection, possibly atypical mycobacterium. 4. Distended gallbladder with stones/sludge in the gallbladder neck, without evidence of wall thickening or pericholecystic fluid. If there is concern for acute cholecystitis, recommend right upper quadrant ultrasound. 5. Atrophic native kidneys with a right lower quadrant transplant kidney. 6. Enhancing 9 mm right upper pole renal lesion, suspicious for a small renal cell carcinoma. Additional hypodense tiny bilateral renal lesions which are incompletely characterized on this study but statistically likely to represent cysts. Nonemergent urology consult suggested. 7. Similar 6 mm bladder stone. 8. Colonic diverticulosis without findings of acute diverticulitis. 9.  Aortic Atherosclerosis (ICD10-I70.0). Electronically Signed   By: Dahlia Bailiff MD   On: 03/04/2021 16:16    Subjective: Patient was seen and examined at bedside.  Overnight events noted.  Patient reports feeling much improved.   He reports chest pain has resolved , he feels better and want to be  discharged.  Discharge Exam: Vitals:   03/07/21 0754 03/07/21 1157  BP: (!) 159/102 131/82  Pulse: 62 64  Resp: 18 16  Temp: 97.9 F (36.6 C) 98.1 F (36.7 C)  SpO2: 94% 94%   Vitals:   03/06/21 2342 03/07/21 0617 03/07/21 0754 03/07/21 1157  BP: (!) 149/93 (!) 150/90 (!) 159/102 131/82  Pulse: 62 62 62 64  Resp: 19 20 18 16   Temp: 97.7 F (36.5 C) 98 F (36.7 C) 97.9 F (36.6 C) 98.1 F (36.7 C)  TempSrc: Oral Axillary Oral Oral  SpO2: 90% 93% 94% 94%  Weight:      Height:        General: Pt is alert, awake, not in acute distress Cardiovascular: RRR, S1/S2 +, no rubs, no gallops Respiratory: CTA bilaterally, no wheezing, no rhonchi Abdominal: Soft, NT, ND, bowel sounds + Extremities: no edema, no cyanosis    The results of significant diagnostics from this hospitalization (including imaging, microbiology, ancillary and laboratory) are listed below for reference.     Microbiology: Recent Results (from the past 240 hour(s))  Resp Panel by RT-PCR (Flu A&B, Covid) Nasopharyngeal Swab     Status: Abnormal   Collection Time: 03/04/21  5:49 PM   Specimen: Nasopharyngeal Swab; Nasopharyngeal(NP) swabs in vial transport medium  Result Value Ref Range Status   SARS Coronavirus 2 by RT PCR POSITIVE (A) NEGATIVE Final    Comment: RESULT CALLED TO, READ BACK BY AND VERIFIED WITH: RN C.MAS ON [id number] AT 2045 BY E.PARRISH (NOTE) SARS-CoV-2 target nucleic acids are DETECTED.  The SARS-CoV-2 RNA is generally detectable in upper respiratory specimens during the acute phase of infection. Positive results are indicative of the presence of the identified virus, but do not rule out bacterial infection or co-infection with other pathogens not detected by the test. Clinical correlation with patient history and other diagnostic information is necessary to determine patient infection status. The expected result is Negative.  Fact Sheet for  Patients: EntrepreneurPulse.com.au  Fact Sheet for Healthcare Providers: IncredibleEmployment.be  This test is not yet approved or cleared by the [place] FDA and  has been authorized for detection and/or diagnosis of SARS-CoV-2 by FDA under an Emergency Use Authorization (EUA).  This EUA will remain in effect (meaning this tes t can be used) for the duration of  the COVID-19 declaration under Section 564(b)(1) of the Act, 21 U.S.C. section 360bbb-3(b)(1), unless the authorization is terminated or revoked sooner.     Influenza A by PCR NEGATIVE NEGATIVE Final   Influenza B by PCR NEGATIVE NEGATIVE Final    Comment: (NOTE) The Xpert Xpress SARS-CoV-2/FLU/RSV plus assay is intended as an aid in the diagnosis of influenza from Nasopharyngeal swab specimens and should not be used as a sole basis for treatment. Nasal washings and aspirates are unacceptable for Xpert Xpress SARS-CoV-2/FLU/RSV testing.  Fact Sheet for Patients: EntrepreneurPulse.com.au  Fact Sheet for Healthcare Providers: IncredibleEmployment.be  This test is not yet approved or cleared by the [place] FDA and has been authorized for detection and/or diagnosis of SARS-CoV-2 by FDA under an Emergency Use Authorization (EUA). This EUA will remain in effect (meaning this test can be used) for the duration of the COVID-19 declaration under Section 564(b)(1) of the Act, 21 U.S.C. section 360bbb-3(b)(1), unless the authorization is terminated or revoked.  Performed at [facility]  Hospital Lab, [place] [address] [place], [place] [address]      Labs: BNP (last 3 results) No results for input(s): BNP in the last 8760 hours. Basic Metabolic Panel: Recent Labs  Lab 03/04/21 1313 03/05/21 0730 03/06/21 0236 03/07/21 0213  NA 136 138 135 135  K 3.6 3.5 4.2 4.2  CL 105 106 103 104  CO2 24 21* 23 25  GLUCOSE 153* 112* 140* 149*  BUN 12 11 20  19   CREATININE 1.93* 1.46* 1.56* 1.56*  CALCIUM 8.6* 8.7* 9.2 9.0   Liver Function Tests: Recent Labs  Lab 03/05/21 0730 03/06/21 0236 03/07/21 0213  AST 24 20 23   ALT 15 15 19   ALKPHOS 65 62 63  BILITOT 1.0 0.7 0.6  PROT 6.3* 6.2* 6.1*  ALBUMIN 3.2* 3.1* 3.0*   Recent Labs  Lab 03/04/21 1313  LIPASE 21   No results for input(s): AMMONIA in the last 168 hours. CBC: Recent Labs  Lab 03/04/21 1313 03/05/21 0730 03/06/21 0236 03/07/21 0213  WBC 4.1 5.3 4.7 4.5  NEUTROABS  --  4.2 3.5 3.4  HGB 12.9* 13.5 13.0 13.2  HCT 41.2 43.3 40.0 41.6  MCV 85.8 86.9 84.0 86.8  PLT 114* 106* 114* 118*   Cardiac Enzymes: No results for input(s): CKTOTAL, CKMB, CKMBINDEX, TROPONINI in the last 168 hours. BNP: Invalid input(s): POCBNP CBG: No results for input(s): GLUCAP in the last 168 hours. D-Dimer Recent Labs    03/06/21 0236 03/07/21 0213  DDIMER 0.33 0.45   Hgb A1c No results for input(s): HGBA1C in the last 72 hours. Lipid Profile No results for input(s): CHOL, HDL, LDLCALC, TRIG, CHOLHDL, LDLDIRECT in the last 72 hours. Thyroid function studies No results for input(s): TSH, T4TOTAL, T3FREE, THYROIDAB in the last 72 hours.  Invalid input(s): FREET3 Anemia work up No results for input(s): VITAMINB12, FOLATE, FERRITIN, TIBC, IRON, RETICCTPCT in the last 72 hours. Urinalysis    Component Value Date/Time   COLORURINE YELLOW 04/17/2019 1330   APPEARANCEUR CLOUDY (A) 04/17/2019 1330   LABSPEC 1.014 04/17/2019 1330   PHURINE 8.0 04/17/2019 1330   GLUCOSEU NEGATIVE 04/17/2019 1330   HGBUR NEGATIVE 04/17/2019 1330   BILIRUBINUR NEGATIVE 04/17/2019 1330   KETONESUR NEGATIVE 04/17/2019 1330   PROTEINUR 30 (A) 04/17/2019 1330   UROBILINOGEN 0.2 07/04/2010 0532   NITRITE NEGATIVE 04/17/2019 1330   LEUKOCYTESUR NEGATIVE 04/17/2019 1330   Sepsis Labs Invalid input(s): PROCALCITONIN,  WBC,  LACTICIDVEN Microbiology Recent Results (from the past 240 hour(s))  Resp  Panel by RT-PCR (Flu A&B, Covid) Nasopharyngeal Swab     Status: Abnormal   Collection Time: 03/04/21  5:49 PM   Specimen: Nasopharyngeal Swab; Nasopharyngeal(NP) swabs in vial transport medium  Result Value Ref Range Status   SARS Coronavirus 2 by RT PCR POSITIVE (A) NEGATIVE Final    Comment: RESULT CALLED TO, READ BACK BY AND VERIFIED WITH: RN C.MAS ON [id number] AT 2045 BY E.PARRISH (NOTE) SARS-CoV-2 target nucleic acids are DETECTED.  The SARS-CoV-2 RNA is generally detectable in upper respiratory specimens during the acute phase of infection. Positive results are indicative of the presence of the identified virus, but do not rule out bacterial infection or co-infection with other pathogens not detected by the test. Clinical correlation with patient history and other diagnostic information is necessary to determine patient infection status. The expected result is Negative.  Fact Sheet for Patients: EntrepreneurPulse.com.au  Fact Sheet for Healthcare Providers: IncredibleEmployment.be  This test is not yet approved or cleared by the [place]  States FDA and  has been authorized for detection and/or diagnosis of SARS-CoV-2 by FDA under an Emergency Use Authorization (EUA).  This EUA will remain in effect (meaning this tes t can be used) for the duration of  the COVID-19 declaration under Section 564(b)(1) of the Act, 21 U.S.C. section 360bbb-3(b)(1), unless the authorization is terminated or revoked sooner.     Influenza A by PCR NEGATIVE NEGATIVE Final   Influenza B by PCR NEGATIVE NEGATIVE Final    Comment: (NOTE) The Xpert Xpress SARS-CoV-2/FLU/RSV plus assay is intended as an aid in the diagnosis of influenza from Nasopharyngeal swab specimens and should not be used as a sole basis for treatment. Nasal washings and aspirates are unacceptable for Xpert Xpress SARS-CoV-2/FLU/RSV testing.  Fact Sheet for  Patients: EntrepreneurPulse.com.au  Fact Sheet for Healthcare Providers: IncredibleEmployment.be  This test is not yet approved or cleared by the [place] FDA and has been authorized for detection and/or diagnosis of SARS-CoV-2 by FDA under an Emergency Use Authorization (EUA). This EUA will remain in effect (meaning this test can be used) for the duration of the COVID-19 declaration under Section 564(b)(1) of the Act, 21 U.S.C. section 360bbb-3(b)(1), unless the authorization is terminated or revoked.  Performed at [facility], [place] [address], [place], [place] [address]      Time coordinating discharge: Over 30 minutes  SIGNED:   Shawna Clamp, MD  [facility] 03/07/2021, 12:26 PM Pager   If 7PM-7AM, please contact night-coverage

## 2021-03-07 NOTE — Discharge Instructions (Signed)
Advised to follow-up with primary care physician in 1 week. Advised to remain in quarantine for 7 days.   Patient still refuses COVID-vaccine.

## 2021-03-22 ENCOUNTER — Other Ambulatory Visit: Payer: Self-pay | Admitting: Registered Nurse

## 2021-03-22 DIAGNOSIS — K829 Disease of gallbladder, unspecified: Secondary | ICD-10-CM

## 2021-07-14 ENCOUNTER — Other Ambulatory Visit: Payer: Self-pay | Admitting: Urology

## 2021-07-25 NOTE — Patient Instructions (Signed)
DUE TO COVID-19 ONLY ONE VISITOR IS ALLOWED TO COME WITH YOU AND STAY IN THE WAITING ROOM ONLY DURING PRE OP AND PROCEDURE.   **NO VISITORS ARE ALLOWED IN THE SHORT STAY AREA OR RECOVERY ROOM!!**  IF YOU WILL BE ADMITTED INTO THE HOSPITAL YOU ARE ALLOWED ONLY TWO SUPPORT PEOPLE DURING VISITATION HOURS ONLY (7 AM -8PM)   The support person(s) must pass our screening, gel in and out, and wear a mask at all times, including in the patients room. Patients must also wear a mask when staff or their support person are in the room. Visitors GUEST BADGE MUST BE WORN VISIBLY  One adult visitor may remain with you overnight and MUST be in the room by 8 P.M.  No visitors under the age of 45. Any visitor under the age of 53 must be accompanied by an adult.        Your procedure is scheduled on: 08/07/21   Report to [facility] Main Entrance    Report to admitting at: 6:35 AM   Call this number if you have problems the morning of surgery [phone]   Do not eat food :After Midnight.   May have liquids until : 5:50 AM   day of surgery  CLEAR LIQUID DIET  Foods Allowed                                                                     Foods Excluded  Water, Black Coffee and tea, regular and decaf                             liquids that you cannot  Plain Jell-O in any flavor  (No red)                                           see through such as: Fruit ices (not with fruit pulp)                                     milk, soups, orange juice              Iced Popsicles (No red)                                    All solid food                                   Apple juices Sports drinks like Gatorade (No red) Lightly seasoned clear broth or consume(fat free) Sugar Sample Menu Breakfast                                Lunch  Supper Cranberry juice                    Beef broth                            Chicken broth Jell-O                                      Grape juice                           Apple juice Coffee or tea                        Jell-O                                      Popsicle                                                Coffee or tea                        Coffee or tea    Oral Hygiene is also important to reduce your risk of infection.                                    Remember - BRUSH YOUR TEETH THE MORNING OF SURGERY WITH YOUR REGULAR TOOTHPASTE   Do NOT smoke after Midnight   Take these medicines the morning of surgery with A SIP OF WATER: apresoline,amlodipine,Cellcept,prograf,carvedilol,prednisone.Use inhalers as usual.  DO NOT TAKE ANY ORAL DIABETIC MEDICATIONS DAY OF YOUR SURGERY                              You may not have any metal on your body including hair pins, jewelry, and body piercing             Do not wear lotions, powders, perfumes/cologne, or deodorant              Men may shave face and neck.   Do not bring valuables to the hospital. [place].   Contacts, dentures or bridgework may not be worn into surgery.   Bring small overnight bag day of surgery.    Patients discharged on the day of surgery will not be allowed to drive home.   Special Instructions: Bring a copy of your healthcare power of attorney and living will documents         the day of surgery if you haven't scanned them before.              Please read over the following fact sheets you were given: IF YOU HAVE QUESTIONS ABOUT YOUR PRE-OP INSTRUCTIONS PLEASE CALL [phone]     [facility] - Preparing for Surgery Before surgery, you can play an important role.  Because skin is not sterile, your skin needs to be as free of germs as possible.  You can reduce the number of germs on your skin by washing with CHG (chlorahexidine gluconate) soap before surgery.  CHG is an antiseptic cleaner which kills germs and bonds with the skin to continue killing germs even after  washing. Please DO NOT use if you have an allergy to CHG or antibacterial soaps.  If your skin becomes reddened/irritated stop using the CHG and inform your nurse when you arrive at Short Stay. Do not shave (including legs and underarms) for at least 48 hours prior to the first CHG shower.  You may shave your face/neck. Please follow these instructions carefully:  1.  Shower with CHG Soap the night before surgery and the  morning of Surgery.  2.  If you choose to wash your hair, wash your hair first as usual with your  normal  shampoo.  3.  After you shampoo, rinse your hair and body thoroughly to remove the  shampoo.                           4.  Use CHG as you would any other liquid soap.  You can apply chg directly  to the skin and wash                       Gently with a scrungie or clean washcloth.  5.  Apply the CHG Soap to your body ONLY FROM THE NECK DOWN.   Do not use on face/ open                           Wound or open sores. Avoid contact with eyes, ears mouth and genitals (private parts).                       Wash face,  Genitals (private parts) with your normal soap.             6.  Wash thoroughly, paying special attention to the area where your surgery  will be performed.  7.  Thoroughly rinse your body with warm water from the neck down.  8.  DO NOT shower/wash with your normal soap after using and rinsing off  the CHG Soap.                9.  Pat yourself dry with a clean towel.            10.  Wear clean pajamas.            11.  Place clean sheets on your bed the night of your first shower and do not  sleep with pets. Day of Surgery : Do not apply any lotions/deodorants the morning of surgery.  Please wear clean clothes to the hospital/surgery center.  FAILURE TO FOLLOW THESE INSTRUCTIONS MAY RESULT IN THE CANCELLATION OF YOUR SURGERY PATIENT SIGNATURE_________________________________  NURSE  SIGNATURE__________________________________  ________________________________________________________________________

## 2021-07-26 ENCOUNTER — Encounter (HOSPITAL_COMMUNITY)
Admission: RE | Admit: 2021-07-26 | Discharge: 2021-07-26 | Disposition: A | Discharge status: Home / Self Care | Payer: Medicare HMO | Source: Ambulatory Visit | Attending: Urology | Admitting: Urology

## 2021-07-26 ENCOUNTER — Encounter (HOSPITAL_COMMUNITY): Payer: Self-pay

## 2021-07-26 ENCOUNTER — Other Ambulatory Visit: Payer: Self-pay

## 2021-07-26 VITALS — BP 130/82 | HR 72 | Temp 98.3°F | Resp 18 | Ht 72.0 in | Wt 238.2 lb

## 2021-07-26 DIAGNOSIS — Z01812 Encounter for preprocedural laboratory examination: Secondary | ICD-10-CM | POA: Diagnosis present

## 2021-07-26 DIAGNOSIS — I10 Essential (primary) hypertension: Secondary | ICD-10-CM | POA: Diagnosis not present

## 2021-07-26 HISTORY — DX: Dyspnea, unspecified: R06.00

## 2021-07-26 LAB — BASIC METABOLIC PANEL
Anion gap: 6 (ref 5–15)
BUN: 11 mg/dL (ref 8–23)
CO2: 21 mmol/L — ABNORMAL LOW (ref 22–32)
Calcium: 8.9 mg/dL (ref 8.9–10.3)
Chloride: 112 mmol/L — ABNORMAL HIGH (ref 98–111)
Creatinine, Ser: 1.11 mg/dL (ref 0.61–1.24)
GFR, Estimated: >60 mL/min (ref >60)
Glucose, Bld: 129 mg/dL — ABNORMAL HIGH (ref 70–99)
Potassium: 3.7 mmol/L (ref 3.5–5.1)
Sodium: 139 mmol/L (ref 135–145)

## 2021-07-26 LAB — CBC
HCT: 44.7 % (ref 39.0–52.0)
Hemoglobin: 13.8 g/dL (ref 13.0–17.0)
MCH: 27.3 pg (ref 26.0–34.0)
MCHC: 30.9 g/dL (ref 30.0–36.0)
MCV: 88.3 fL (ref 80.0–100.0)
Platelets: 115 10*3/uL — ABNORMAL LOW (ref 150–400)
RBC: 5.06 MIL/uL (ref 4.22–5.81)
RDW: 13.7 % (ref 11.5–15.5)
WBC: 4.6 10*3/uL (ref 4.0–10.5)
nRBC: 0 % (ref 0.0–0.2)

## 2021-07-26 NOTE — Progress Notes (Signed)
COVID Vaccine Completed: NO Date COVID Vaccine completed: COVID vaccine manufacturer: [place] Test: N/A PCP - Dr. Stark Klein Cardiologist -   Chest x-ray - 03/04/21 EKG - 03/06/21 Stress Test -  ECHO -  Cardiac Cath - 2016 Pacemaker/ICD device last checked:  Sleep Study -  CPAP -   Fasting Blood Sugar -  Checks Blood Sugar _____ times a day  Blood Thinner Instructions: Aspirin Instructions: Last Dose:  Anesthesia review: Hx: CKD IV,HTN,MI  Patient denies shortness of breath, fever, cough and chest pain at PAT appointment   Patient verbalized understanding of instructions that were given to them at the PAT appointment. Patient was also instructed that they will need to review over the PAT instructions again at home before surgery.

## 2021-08-06 NOTE — Anesthesia Preprocedure Evaluation (Addendum)
Anesthesia Evaluation  Patient identified by MRN, date of birth, ID band Patient awake    Reviewed: Allergy & Precautions, NPO status , Patient's Chart, lab work & pertinent test results, reviewed documented beta blocker date and time   History of Anesthesia Complications Negative for: history of anesthetic complications  Airway Mallampati: II  TM Distance: >3 FB Neck ROM: Full    Dental  (+) Missing, Poor Dentition, Partial Upper   Pulmonary    Pulmonary exam normal        Cardiovascular hypertension, Pt. on medications and Pt. on home beta blockers + Past MI  Normal cardiovascular exam     Neuro/Psych    GI/Hepatic GERD  Controlled,  Endo/Other    Renal/GU Renal InsufficiencyRenal disease (s/p renal transplant)     Musculoskeletal   Abdominal   Peds  Hematology Plts 115k   Anesthesia Other Findings   Reproductive/Obstetrics                            Anesthesia Physical Anesthesia Plan  ASA: 3  Anesthesia Plan: General   Post-op Pain Management: Tylenol PO (pre-op)   Induction: Intravenous  PONV Risk Score and Plan: 2 and Midazolam, Treatment may vary due to age or medical condition, Ondansetron and Dexamethasone  Airway Management Planned: LMA  Additional Equipment: None  Intra-op Plan:   Post-operative Plan: Extubation in OR  Informed Consent: I have reviewed the patients History and Physical, chart, labs and discussed the procedure including the risks, benefits and alternatives for the proposed anesthesia with the patient or authorized representative who has indicated his/her understanding and acceptance.     Dental advisory given  Plan Discussed with: CRNA  Anesthesia Plan Comments:        Anesthesia Quick Evaluation

## 2021-08-07 ENCOUNTER — Other Ambulatory Visit: Payer: Self-pay

## 2021-08-07 ENCOUNTER — Encounter (HOSPITAL_COMMUNITY): Admission: RE | Payer: Self-pay | Source: Home / Self Care

## 2021-08-07 ENCOUNTER — Ambulatory Visit (HOSPITAL_COMMUNITY): Payer: Medicare HMO | Admitting: Anesthesiology

## 2021-08-07 ENCOUNTER — Encounter (HOSPITAL_COMMUNITY): Payer: Self-pay | Admitting: Urology

## 2021-08-07 ENCOUNTER — Ambulatory Visit (HOSPITAL_COMMUNITY)
Admission: RE | Admit: 2021-08-07 | Discharge: 2021-08-07 | Disposition: A | Discharge status: Home / Self Care | Payer: Medicare HMO | Source: Ambulatory Visit | Attending: Urology | Admitting: Urology

## 2021-08-07 ENCOUNTER — Ambulatory Visit (HOSPITAL_COMMUNITY): Admission: RE | Admit: 2021-08-07 | Payer: Medicare HMO | Source: Home / Self Care | Admitting: Urology

## 2021-08-07 ENCOUNTER — Encounter (HOSPITAL_COMMUNITY)
Admission: RE | Disposition: A | Discharge status: Home / Self Care | Payer: Self-pay | Source: Ambulatory Visit | Attending: Urology

## 2021-08-07 DIAGNOSIS — I10 Essential (primary) hypertension: Secondary | ICD-10-CM | POA: Diagnosis not present

## 2021-08-07 DIAGNOSIS — N3281 Overactive bladder: Secondary | ICD-10-CM | POA: Insufficient documentation

## 2021-08-07 DIAGNOSIS — I252 Old myocardial infarction: Secondary | ICD-10-CM | POA: Diagnosis not present

## 2021-08-07 DIAGNOSIS — K219 Gastro-esophageal reflux disease without esophagitis: Secondary | ICD-10-CM | POA: Insufficient documentation

## 2021-08-07 DIAGNOSIS — N289 Disorder of kidney and ureter, unspecified: Secondary | ICD-10-CM | POA: Diagnosis not present

## 2021-08-07 DIAGNOSIS — Z940 Kidney transplant status: Secondary | ICD-10-CM | POA: Insufficient documentation

## 2021-08-07 HISTORY — PX: BOTOX INJECTION: SHX5754

## 2021-08-07 SURGERY — BOTOX INJECTION
Anesthesia: General

## 2021-08-07 SURGERY — BOTOX INJECTION
Anesthesia: Choice

## 2021-08-07 MED ORDER — LIDOCAINE 2% (20 MG/ML) 5 ML SYRINGE
INTRAMUSCULAR | Status: DC | PRN
  Administered 2021-08-07: 100 mg via INTRAVENOUS

## 2021-08-07 MED ORDER — ONABOTULINUMTOXINA 100 UNITS IJ SOLR
INTRAMUSCULAR | Status: AC
  Filled 2021-08-07: qty 100

## 2021-08-07 MED ORDER — OXYCODONE HCL 5 MG PO TABS: 5.0000 mg | ORAL_TABLET | Freq: Once | ORAL | Status: DC | PRN

## 2021-08-07 MED ORDER — SODIUM CHLORIDE (PF) 0.9 % IJ SOLN
INTRAMUSCULAR | Status: AC
  Filled 2021-08-07: qty 10

## 2021-08-07 MED ORDER — PROPOFOL 10 MG/ML IV BOLUS
INTRAVENOUS | Status: DC | PRN
  Administered 2021-08-07: 200 mg via INTRAVENOUS

## 2021-08-07 MED ORDER — LACTATED RINGERS IV SOLN: INTRAVENOUS | Status: DC

## 2021-08-07 MED ORDER — ONDANSETRON HCL 4 MG/2ML IJ SOLN
INTRAMUSCULAR | Status: DC | PRN
  Administered 2021-08-07: 4 mg via INTRAVENOUS

## 2021-08-07 MED ORDER — OXYCODONE HCL 5 MG/5ML PO SOLN: 5.0000 mg | Freq: Once | ORAL | Status: DC | PRN

## 2021-08-07 MED ORDER — ONABOTULINUMTOXINA 100 UNITS IJ SOLR
INTRAMUSCULAR | Status: DC | PRN
  Administered 2021-08-07: 100 [IU] via INTRAMUSCULAR

## 2021-08-07 MED ORDER — FENTANYL CITRATE PF 50 MCG/ML IJ SOSY: 25.0000 ug | PREFILLED_SYRINGE | INTRAMUSCULAR | Status: DC | PRN

## 2021-08-07 MED ORDER — MIDAZOLAM HCL 5 MG/5ML IJ SOLN
INTRAMUSCULAR | Status: DC | PRN
  Administered 2021-08-07: 2 mg via INTRAVENOUS

## 2021-08-07 MED ORDER — STERILE WATER FOR IRRIGATION IR SOLN
Status: DC | PRN
  Administered 2021-08-07: 3000 mL

## 2021-08-07 MED ORDER — MIDAZOLAM HCL 2 MG/2ML IJ SOLN
INTRAMUSCULAR | Status: AC
  Filled 2021-08-07: qty 2

## 2021-08-07 MED ORDER — ORAL CARE MOUTH RINSE: 15.0000 mL | Freq: Once | OROMUCOSAL | Status: AC

## 2021-08-07 MED ORDER — PROPOFOL 10 MG/ML IV BOLUS
INTRAVENOUS | Status: AC
  Filled 2021-08-07: qty 20

## 2021-08-07 MED ORDER — FENTANYL CITRATE (PF) 100 MCG/2ML IJ SOLN
INTRAMUSCULAR | Status: AC
  Filled 2021-08-07: qty 2

## 2021-08-07 MED ORDER — PROMETHAZINE HCL 25 MG/ML IJ SOLN: 6.2500 mg | INTRAMUSCULAR | Status: DC | PRN

## 2021-08-07 MED ORDER — DEXAMETHASONE SODIUM PHOSPHATE 10 MG/ML IJ SOLN
INTRAMUSCULAR | Status: DC | PRN
  Administered 2021-08-07: 5 mg via INTRAVENOUS

## 2021-08-07 MED ORDER — CHLORHEXIDINE GLUCONATE 0.12 % MT SOLN
15.0000 mL | Freq: Once | OROMUCOSAL | Status: AC
  Administered 2021-08-07: 15 mL via OROMUCOSAL

## 2021-08-07 MED ORDER — ACETAMINOPHEN 500 MG PO TABS
1000.0000 mg | ORAL_TABLET | Freq: Once | ORAL | Status: AC
  Administered 2021-08-07: 1000 mg via ORAL
  Filled 2021-08-07: qty 2

## 2021-08-07 MED ORDER — CEFAZOLIN SODIUM-DEXTROSE 2-4 GM/100ML-% IV SOLN
2.0000 g | INTRAVENOUS | Status: AC
  Administered 2021-08-07: 2 g via INTRAVENOUS
  Filled 2021-08-07: qty 100

## 2021-08-07 SURGICAL SUPPLY — 13 items
BAG URO CATCHER STRL LF (MISCELLANEOUS) ×2 IMPLANT
CLOTH BEACON ORANGE TIMEOUT ST (SAFETY) ×2 IMPLANT
GLOVE SURG ENC MOIS LTX SZ7.5 (GLOVE) ×2 IMPLANT
GOWN STRL REUS W/TWL XL LVL3 (GOWN DISPOSABLE) ×4 IMPLANT
MANIFOLD NEPTUNE II (INSTRUMENTS) ×2 IMPLANT
NDL ASPIRATION 22 (NEEDLE) ×1 IMPLANT
NDL SAFETY ECLIPSE 18X1.5 (NEEDLE) IMPLANT
NEEDLE ASPIRATION 22 (NEEDLE) ×2 IMPLANT
NEEDLE HYPO 18GX1.5 SHARP (NEEDLE)
PACK CYSTO (CUSTOM PROCEDURE TRAY) ×2 IMPLANT
SYR CONTROL 10ML LL (SYRINGE) IMPLANT
TUBING CONNECTING 10 (TUBING) IMPLANT
WATER STERILE IRR 3000ML UROMA (IV SOLUTION) ×2 IMPLANT

## 2021-08-07 NOTE — H&P (Signed)
CC: AUA Questions Scoring.  HPI: Jon [name] is a 66 year-old male established patient who is here AUA Questions.      CC/HPI: CC: Lower urinary tract symptoms, prostate check  HPI:  01/20/2020  66 year old male with a history of kidney transplant back in 2018. He is referred to me for significant lower urinary tract symptoms including weak stream, incomplete emptying, nocturia up to 10 times a night, daytime urinary frequency. He has never tried any medication for his prostate. Unsure of PSA status. Denies any hematuria or dysuria.   04/13/2020  Patient tried tamsulosin. This had no effectiveness. PSA at the last visit was normal. His main complaint today is nocturia up to 15 times a night. He is getting no sleep. Denies any dysuria. He also has daytime frequency.   12/29/2020  Patient completed PTNS. Nocturia is down to 1 time a night. He does still have some urinary frequency and intermittency. He is not very bothered by this. He is having some dysuria that has been going on for about 3 weeks.   02/23/2021  Nocturia is back to over 5 times a night. He is getting very little sleep. He is not interested in PTNS again. Asking him again, he states that his nocturia never improved though he did tell me it was down to 1 time a night at the last visit. He failed tamsulosin and passed.   03/24/2021:  66 year old male who presents today for 2 week follow-up. At last office visit he was started on desmopressin for nocturia. He has failed other therapies including PTNS. Lab work has been stable with a sodium level of 140. He is actually not taking does not Dexter. He does not swallow pills well. He reports he has to chew them or crush some. He has failed other medications because he has been unable to swallow the pills. He reports nocturia of 4-15 times per night as well as urinary frequency of every hour during the day. His PVR is around 200 mL today he does have a history of a kidney transplant.    05/30/2021  Patient started alfuzosin and oxybutynin solution. He continues to have significant nocturia. At the last visit, he stated that he is unable to swallow pills well which prevented him from an adequate trial of desmopressin. He has been doing okay taking the alfuzosin and oxybutynin but does not note much improvement in his frequency and nocturia.   07/13/2021  Patient underwent urodynamics. This clearly showed detrusor overactivity with unstable detrusor contractions. He was unable to generate a strong contraction during the voiding phase but once he was in a private area, he was able to void with minimal residual and a maximum flow of 20 cc/min. He denies any obstructive voiding complaints and primarily has irritative voiding symptoms.     AUA Symptom Score: Almost always he has the sensation of not emptying his bladder completely when finished urinating. Almost always he has to urinate again fewer than two hours after he has finished urinating. More than 50% of the time he has to start and stop again several times when he urinates. He never finds it difficult to postpone urination. He never has a weak urinary stream. He never has to push or strain to begin urination. He has to get up to urinate 5 or more times from the time he goes to bed until the time he gets up in the morning.   Calculated AUA Symptom Score: 19    ALLERGIES: No Known Drug  Allergies    MEDICATIONS: Alfuzosin Hcl Er 10 mg tablet, extended release 24 hr 1/2 tablet PO Daily  Aspirin  Amlodipine Besylate 10 mg tablet  Atorvastatin Calcium 20 mg tablet tablet  Carvedilol 25 mg tablet  Hydralazine Hcl 50 mg tablet  Magnesium Oxide 400 mg tablet tablet  Mycophenolate Mofetil  Nitroglycerin 0.4 mg tablet, sublingual  Phos-Nak 280 mg-160 mg-250 mg powder in packet  Polyethylene Glycol  Prednisone 5 mg tablet  Tacrolimus 1 mg capsule     GU PSH: Complex cystometrogram, w/ void pressure and urethral pressure  profile studies, any technique - 07/06/2021 Complex Uroflow - 07/06/2021 Emg surf Electrd - 07/06/2021 Inject For cystogram - 07/06/2021 Intrabd voidng Press - 07/06/2021       PSH Notes: Gastric surgery  L Arm surgery   NON-GU PSH: Neuroeltrd Stim Post Tibial - 11/24/2020, 11/17/2020, 11/10/2020, 11/03/2020, 10/27/2020, 10/20/2020, 10/13/2020, 10/06/2020, 09/29/2020, 09/22/2020, 09/15/2020, 09/08/2020 Transplant Kidney, Right     GU PMH: Incomplete bladder emptying - 07/06/2021, - 05/30/2021, - 01/20/2020 Urinary Frequency - 07/06/2021, - 05/30/2021, - 03/24/2021 (Stable), - 02/23/2021, - 12/29/2020, - 11/24/2020, - 11/17/2020, - 11/10/2020, - 11/03/2020, - 10/27/2020, - 10/20/2020, - 10/13/2020, - 10/06/2020, - 09/29/2020, - 09/22/2020, - 09/15/2020, - 09/08/2020, - 04/13/2020, - 01/20/2020 Urinary Urgency - 07/06/2021, - 03/24/2021 BPH w/LUTS - 05/30/2021, - 03/24/2021, - 12/29/2020, - 11/24/2020, - 08/25/2020, - 04/13/2020, - 01/20/2020 Nocturia - 05/30/2021, - 03/24/2021 (Stable), - 02/23/2021, - 12/29/2020, He has not had significant improvement in his LUTS and specifically nocturia and does not want to proceed with maintenance. We will arrange for him to see his primary urologist for other options. , - 11/24/2020, - 09/08/2020, - 08/25/2020, - 04/13/2020, - 01/20/2020 Chronic prostatitis - 12/29/2020 Dysuria - 12/29/2020 Encounter for Prostate Cancer screening - 01/20/2020 Weak Urinary Stream - 01/20/2020 Kidney Failure Unspec    NON-GU PMH: Hypertension    FAMILY HISTORY: 4 sons - Other Diabetes - Runs in Family Hypertension - Runs in Family Kidney Failure - Brother nephrolithiasis - Brother stroke - Mother   SOCIAL HISTORY: Marital Status: Married    REVIEW OF SYSTEMS:    GU Review Male:   Patient reports burning/ pain with urination, get up at night to urinate, and leakage of urine. Patient denies frequent urination, hard to postpone urination, stream starts and stops, trouble starting your stream, have to strain to urinate ,  erection problems, and penile pain.  Gastrointestinal (Upper):   Patient denies nausea, vomiting, and indigestion/ heartburn.  Gastrointestinal (Lower):   Patient denies diarrhea and constipation.  Constitutional:   Patient denies fever, night sweats, weight loss, and fatigue.  Skin:   Patient denies skin rash/ lesion and itching.  Eyes:   Patient denies blurred vision and double vision.  Ears/ Nose/ Throat:   Patient denies sore throat and sinus problems.  Hematologic/Lymphatic:   Patient denies swollen glands and easy bruising.  Cardiovascular:   Patient denies leg swelling and chest pains.  Respiratory:   Patient reports shortness of breath. Patient denies cough.  Endocrine:   Patient denies excessive thirst.  Musculoskeletal:   Patient denies back pain and joint pain.  Neurological:   Patient denies dizziness and headaches.  Psychologic:   Patient denies depression and anxiety.   VITAL SIGNS: None   MULTI-SYSTEM PHYSICAL EXAMINATION:    Constitutional: Well-nourished. No physical deformities. Normally developed. Good grooming.  Gastrointestinal: No mass, no tenderness, no rigidity, non obese abdomen.     Complexity of Data:  Source Of History:  Patient  Records Review:   Previous Patient Records  Urine Test Review:   Urinalysis  Urodynamics Review:   Review Urodynamics Tests   01/20/20  PSA  Total PSA 1.40 ng/mL    PROCEDURES:          Urinalysis w/Scope Dipstick Dipstick Cont'd Micro  Color: Yellow Bilirubin: Neg mg/dL WBC/hpf: NS (Not Seen)  Appearance: Clear Ketones: Neg mg/dL RBC/hpf: 0 - 2/hpf  Specific Gravity: 1.015 Blood: Neg ery/uL Bacteria: NS (Not Seen)  pH: 6.5 Protein: 1+ mg/dL Cystals: Amorph Urates  Glucose: Neg mg/dL Urobilinogen: 0.2 mg/dL Casts: NS (Not Seen)    Nitrites: Neg Trichomonas: Not Present    Leukocyte Esterase: Neg leu/uL Mucous: Not Present      Epithelial Cells: NS (Not Seen)      Yeast: NS (Not Seen)      Sperm: Not Present     ASSESSMENT:      ICD-10 Details  1 GU:   BPH w/LUTS - N40.1 Chronic, Stable  2   Detrusor overactivity - N31.1 Undiagnosed New Problem  3   Urinary Frequency - R35.0 Chronic, Stable  4   Nocturia - R35.1 Chronic, Stable  5   Urinary Urgency - R39.15 Chronic, Stable   PLAN:           Orders Labs Urine Culture          Schedule Procedure: Unspecified Date at [facility] - 29199 - Botox 100mg  (Botulinum Toxin Type A, Per Unit) - 30160, [id number]          Document Letter(s):  Created for Patient: Clinical Summary         Notes:   He would like to proceed with intra detrusor Botox, 100 units. Risk of bleeding, infection, and urinary retention discussed.    Signed by Link Snuffer, III, M.D. on 07/13/21 at 10:39 AM (EST

## 2021-08-07 NOTE — Anesthesia Procedure Notes (Signed)
Procedure Name: LMA Insertion Date/Time: 08/07/2021 3:21 PM Performed by: Gean Maidens, CRNA Pre-anesthesia Checklist: Patient identified, Emergency Drugs available, Suction available, Patient being monitored and Timeout performed Patient Re-evaluated:Patient Re-evaluated prior to induction Oxygen Delivery Method: Circle system utilized Preoxygenation: Pre-oxygenation with 100% oxygen Induction Type: IV induction Ventilation: Mask ventilation without difficulty LMA: LMA inserted LMA Size: 4.0 Number of attempts: 1 Placement Confirmation: positive ETCO2 and breath sounds checked- equal and bilateral Tube secured with: Tape Dental Injury: Teeth and Oropharynx as per pre-operative assessment

## 2021-08-07 NOTE — Op Note (Signed)
Operative Note  Preoperative diagnosis:  1.  Overactive bladder  Postoperative diagnosis: 1.  Overactive bladder  Procedure(s): 1.  Cystoscopy with 100 units of intra detrusor Botox  Surgeon: Link Snuffer, MD  Assistants: None  Anesthesia: General  Complications: None immediate  EBL: Minimal  Specimens: 1.  None  Drains/Catheters: 1.  None  Intraoperative findings: 1.  Normal anterior urethra 2.  Borderline obstructing prostate 3.  Normal bladder mucosa.  Bilateral ureteral orifices were normal. 4.  100 units of Botox systematically placed throughout the bladder  Indication: 66 year old male with severe nocturia.  He presents for Botox.  He has failed multiple other medical management options.  Description of procedure:  The patient was identified and consent was obtained.  The patient was taken to the operating room and placed in the supine position.  The patient was placed under general anesthesia.  Perioperative antibiotics were administered.  The patient was placed in dorsal lithotomy.  Patient was prepped and draped in a standard sterile fashion and a timeout was performed.  A 21 French rigid cystoscope was advanced into the urethra and into the bladder.  Complete cystoscopy was performed with the findings noted above.  100 units of Botox was systematically injected throughout the bladder into the detrusor.  There was no significant active bleeding at the conclusion of the case.  I drained the bladder and withdrew the scope.  Patient tolerated procedure well was stable postoperative.  Plan: Return in 1 to 2 weeks for PVR and symptom assessment

## 2021-08-07 NOTE — Anesthesia Postprocedure Evaluation (Signed)
Anesthesia Post Note  Patient: [name]  Procedure(s) Performed: cystoscopy BOTOX INJECTION     Patient location during evaluation: PACU Anesthesia Type: General Level of consciousness: awake and alert and oriented Pain management: pain level controlled Vital Signs Assessment: post-procedure vital signs reviewed and stable Respiratory status: spontaneous breathing, nonlabored ventilation and respiratory function stable Cardiovascular status: blood pressure returned to baseline Postop Assessment: no apparent nausea or vomiting Anesthetic complications: no   No notable events documented.  Last Vitals:  Vitals:   08/07/21 1545 08/07/21 1600  BP: (!) 140/99 (!) 142/99  Pulse: (!) 59 60  Resp: 18 18  Temp:    SpO2: 100% 100%    Last Pain:  Vitals:   08/07/21 1600  TempSrc:   PainSc: 0-No pain                 Marthenia Rolling

## 2021-08-07 NOTE — Transfer of Care (Signed)
Immediate Anesthesia Transfer of Care Note  Patient: Jon [name]  Procedure(s) Performed: cystoscopy BOTOX INJECTION  Patient Location: PACU  Anesthesia Type:General  Level of Consciousness: sedated, patient cooperative and responds to stimulation  Airway & Oxygen Therapy: Patient Spontanous Breathing and Patient connected to face mask oxygen  Post-op Assessment: Report given to RN and Post -op Vital signs reviewed and stable  Post vital signs: Reviewed and stable  Last Vitals:  Vitals Value Taken Time  BP 135/96 08/07/21 1541  Temp    Pulse 62 08/07/21 1542  Resp 19 08/07/21 1542  SpO2 100 % 08/07/21 1542  Vitals shown include unvalidated device data.  Last Pain:  Vitals:   08/07/21 1312  TempSrc:   PainSc: 0-No pain         Complications: No notable events documented.

## 2021-08-08 ENCOUNTER — Encounter (HOSPITAL_COMMUNITY): Payer: Self-pay | Admitting: Urology

## 2021-09-26 ENCOUNTER — Emergency Department (HOSPITAL_COMMUNITY): Payer: Medicare HMO

## 2021-09-26 ENCOUNTER — Emergency Department (HOSPITAL_COMMUNITY)
Admission: EM | Admit: 2021-09-26 | Discharge: 2021-09-26 | Disposition: A | Discharge status: Home / Self Care | Payer: Medicare HMO | Attending: Emergency Medicine | Admitting: Emergency Medicine

## 2021-09-26 ENCOUNTER — Encounter (HOSPITAL_COMMUNITY): Payer: Self-pay | Admitting: *Deleted

## 2021-09-26 ENCOUNTER — Other Ambulatory Visit: Payer: Self-pay

## 2021-09-26 DIAGNOSIS — B029 Zoster without complications: Secondary | ICD-10-CM | POA: Insufficient documentation

## 2021-09-26 DIAGNOSIS — R21 Rash and other nonspecific skin eruption: Secondary | ICD-10-CM | POA: Diagnosis present

## 2021-09-26 DIAGNOSIS — Z79899 Other long term (current) drug therapy: Secondary | ICD-10-CM | POA: Diagnosis not present

## 2021-09-26 DIAGNOSIS — Z7982 Long term (current) use of aspirin: Secondary | ICD-10-CM | POA: Diagnosis not present

## 2021-09-26 LAB — BASIC METABOLIC PANEL
Anion gap: 6 (ref 5–15)
BUN: 14 mg/dL (ref 8–23)
CO2: 25 mmol/L (ref 22–32)
Calcium: 9.1 mg/dL (ref 8.9–10.3)
Chloride: 106 mmol/L (ref 98–111)
Creatinine, Ser: 1.59 mg/dL — ABNORMAL HIGH (ref 0.61–1.24)
GFR, Estimated: 48 mL/min — ABNORMAL LOW (ref >60)
Glucose, Bld: 122 mg/dL — ABNORMAL HIGH (ref 70–99)
Potassium: 3.3 mmol/L — ABNORMAL LOW (ref 3.5–5.1)
Sodium: 137 mmol/L (ref 135–145)

## 2021-09-26 LAB — CBC WITH DIFFERENTIAL/PLATELET
Abs Immature Granulocytes: 0.12 10*3/uL — ABNORMAL HIGH (ref 0.00–0.07)
Basophils Absolute: 0 10*3/uL (ref 0.0–0.1)
Basophils Relative: 1 %
Eosinophils Absolute: 0.1 10*3/uL (ref 0.0–0.5)
Eosinophils Relative: 2 %
HCT: 44.5 % (ref 39.0–52.0)
Hemoglobin: 14 g/dL (ref 13.0–17.0)
Immature Granulocytes: 2 %
Lymphocytes Relative: 19 %
Lymphs Abs: 1.1 10*3/uL (ref 0.7–4.0)
MCH: 26.9 pg (ref 26.0–34.0)
MCHC: 31.5 g/dL (ref 30.0–36.0)
MCV: 85.6 fL (ref 80.0–100.0)
Monocytes Absolute: 0.8 10*3/uL (ref 0.1–1.0)
Monocytes Relative: 14 %
Neutro Abs: 3.6 10*3/uL (ref 1.7–7.7)
Neutrophils Relative %: 62 %
Platelets: 151 10*3/uL (ref 150–400)
RBC: 5.2 MIL/uL (ref 4.22–5.81)
RDW: 13.6 % (ref 11.5–15.5)
WBC: 5.7 10*3/uL (ref 4.0–10.5)
nRBC: 0 % (ref 0.0–0.2)

## 2021-09-26 MED ORDER — LIDOCAINE 5 % EX PTCH
1.0000 | MEDICATED_PATCH | CUTANEOUS | Status: DC
  Administered 2021-09-26: 1 via TRANSDERMAL
  Filled 2021-09-26: qty 1

## 2021-09-26 MED ORDER — ACYCLOVIR 400 MG PO TABS: 400.0000 mg | ORAL_TABLET | Freq: Two times a day (BID) | ORAL | 0 refills | Status: DC

## 2021-09-26 MED ORDER — LIDOCAINE 5 % EX PTCH: 1.0000 | MEDICATED_PATCH | CUTANEOUS | 0 refills | Status: DC

## 2021-09-26 NOTE — ED Triage Notes (Signed)
3 days "burning" painful rash on the right back and right chest area. Blistering, not draining.

## 2021-09-26 NOTE — Discharge Instructions (Addendum)
Take acyclovir twice daily for the next 7 days.  Follow-up with your primary doctor in the next few days for reevaluation.  Return to the ED if things change or worsen.

## 2021-09-26 NOTE — ED Provider Notes (Signed)
[facility] DEPT Provider Note   CSN: [id number] Arrival date & time: 09/26/21  0636     History  Chief Complaint  Patient presents with   Rash    Jon [name] is a 66 y.o. male.  The history is provided by the patient and medical records.  Rash  Patient with medical history of hypertension, status post kidney transplant, chronic renal presents status presents due to rash.  The rash started 3 days ago, its on his right flank.  It is painful, has not been spreading elsewhere.  Endorses some pruritus, no discharge.  No history of herpes, has not had this therapy zoster vaccination yet.  Denies any fevers, nausea, vomiting.  Patient's kidney transplant was 4 years previous.  Home Medications Prior to Admission medications   Medication Sig Start Date End Date Taking? Authorizing Provider  acyclovir (ZOVIRAX) 400 MG tablet Take 1 tablet (400 mg total) by mouth 2 (two) times daily. 09/26/21  Yes Sherrill Raring, PA-C  lidocaine (LIDODERM) 5 % Place 1 patch onto the skin daily. Remove & Discard patch within 12 hours or as directed by MD 09/26/21  Yes Sherrill Raring, PA-C  albuterol (PROVENTIL HFA;VENTOLIN HFA) 108 (90 Base) MCG/ACT inhaler Inhale 2 puffs into the lungs every 6 (six) hours as needed for wheezing or shortness of breath. 01/24/17   Shela Leff, MD  amLODipine (NORVASC) 10 MG tablet Take 10 mg by mouth at bedtime.    [provider]  aspirin EC 81 MG EC tablet Take 1 tablet (81 mg total) by mouth daily. 05/30/13   Charolette Forward, MD  atorvastatin (LIPITOR) 20 MG tablet Take 20 mg by mouth daily.    [provider]  carvedilol (COREG) 25 MG tablet Take 1 tablet (25 mg total) by mouth 2 (two) times daily with a meal. 04/24/19   Mullis, Kiersten P, DO  COMBIGAN 0.2-0.5 % ophthalmic solution Place 1 drop into both eyes 2 (two) times daily. 02/08/21   [provider]  hydrALAZINE (APRESOLINE) 50 MG tablet Take 1 tablet (50 mg  total) by mouth 3 (three) times daily. 01/06/19   Rhyne, Hulen Shouts, PA-C  magnesium oxide (MAG-OX) 400 MG tablet Take 800 mg by mouth 2 (two) times a day. Take 2 hrs before or after Cellcept    [provider]  mycophenolate (CELLCEPT) 200 MG/ML suspension Take 5 mLs by mouth 2 (two) times a day. 03/20/18   [provider]  PHOS-NAK 280-160-250 MG PACK Take 2 Packages by mouth 3 (three) times daily. 12/15/18   [provider]  predniSONE 5 MG/5ML solution Take 5 mLs by mouth daily. 11/18/18   [provider]  Sodium Bicarbonate POWD Take 5 mLs by mouth 2 (two) times a day. Baking soda    [provider]  sulfamethoxazole-trimethoprim (BACTRIM) 200-40 MG/5ML suspension Take 10 mLs by mouth 3 (three) times a week. M- W- F 06/18/19   [provider]  tacrolimus (PROGRAF) 1 MG capsule Take 4 mg by mouth 2 (two) times daily.     [provider]  traMADol (ULTRAM) 50 MG tablet Take 1 tablet (50 mg total) by mouth every 6 (six) hours as needed for severe pain. 04/24/19   Mullis, Kiersten P, DO  VYZULTA 0.024 % SOLN Place 1 drop into both eyes at bedtime. 02/08/21   [provider]  fluticasone (FLOVENT HFA) 44 MCG/ACT inhaler Inhale 1 puff into the lungs 2 (two) times daily. Patient not taking: No sig reported  03/08/17 03/04/21  Chesley Mires, MD      Allergies    Patient has no known allergies.    Review of Systems   Review of Systems  Skin:  Positive for rash.   Physical Exam Updated Vital Signs BP (!) 137/95    Pulse 67    Temp 98.6 F (37 C) (Oral)    Resp 16    SpO2 96%  Physical Exam Vitals and nursing note reviewed.  Constitutional:      General: He is not in acute distress.    Appearance: He is well-developed.  HENT:     Head: Normocephalic and atraumatic.  Eyes:     Conjunctiva/sclera: Conjunctivae normal.  Cardiovascular:     Rate and Rhythm: Normal rate and regular rhythm.     Heart sounds: No murmur  heard. Pulmonary:     Effort: Pulmonary effort is normal. No respiratory distress.     Breath sounds: Normal breath sounds.  Abdominal:     Palpations: Abdomen is soft.     Tenderness: There is no abdominal tenderness.  Musculoskeletal:        General: No swelling.     Cervical back: Neck supple.  Skin:    General: Skin is warm and dry.     Capillary Refill: Capillary refill takes less than 2 seconds.     Findings: Rash present.     Comments: Herpetic rash disseminated along single dermatome distribution to right chest.  Neurological:     Mental Status: He is alert.  Psychiatric:        Mood and Affect: Mood normal.    ED Results / Procedures / Treatments   Labs (all labs ordered are listed, but only abnormal results are displayed) Labs Reviewed  CBC WITH DIFFERENTIAL/PLATELET - Abnormal; Notable for the following components:      Result Value   Abs Immature Granulocytes 0.12 (*)    All other components within normal limits  BASIC METABOLIC PANEL - Abnormal; Notable for the following components:   Potassium 3.3 (*)    Glucose, Bld 122 (*)    Creatinine, Ser 1.59 (*)    GFR, Estimated 48 (*)    All other components within normal limits    EKG None  Radiology DG Chest Portable 1 View  Result Date: 09/26/2021 CLINICAL DATA:  Cough. EXAM: PORTABLE CHEST 1 VIEW COMPARISON:  03/04/2021 FINDINGS: The lungs are clear without focal pneumonia, edema, pneumothorax or pleural effusion. The cardiopericardial silhouette is within normal limits for size. The visualized bony structures of the thorax are unremarkable. IMPRESSION: No active disease. Electronically Signed   By: Misty Stanley M.D.   On: 09/26/2021 07:57    Procedures Procedures    Medications Ordered in ED Medications  lidocaine (LIDODERM) 5 % 1 patch (1 patch Transdermal Patch Applied 09/26/21 0755)    ED Course/ Medical Decision Making/ A&P                           Medical Decision Making Amount and/or  Complexity of Data Reviewed Labs: ordered. Radiology: ordered.  Risk Prescription drug management.   This patient presents to the ED for concern of rash, this involves an extensive number of treatment options, and is a complaint that carries with it a high risk of complications and morbidity.  The differential diagnosis includes herpes zoster, TN/SJS, disseminated herpes, cellulitis  Patients presentation is complicated by their history of immunosuppressant therapy secondary to kidney transplant  in 2018, resulting in higher risk of complication with disseminated zoster.   Additional history obtained:   I reviewed recent transplant follow-up record on 09/20/2021 record, noted that the transplant performed 07/15/2017. Current immunosuppression regimen: Prograf 3 mg BID, Cellcept 1000mg  BID, and Prednisone 5mg  daily.  Vaccinated against COVID or flu, declines vaccinations as "shots make him sick".    Lab Tests:  I ordered, viewed, and personally interpreted labs.  The pertinent results include: CBC without anemia or leukocytosis.  BMP shows creatinine 1.5 which is roughly baseline.    Imaging Studies ordered:  I directly visualized the CXR, which showed no pneumonitis   I agree with the radiologist interpretation    Medicines ordered and prescription drug management:  I ordered medication including: lidoderm    I have reviewed the patients home medicines and have made adjustments as needed    Reevaluation:  After the interventions noted above, I reevaluated the patient and found they have improved.    Problems addressed / ED Course: Patient is a 66 year old male on chronic immunosuppressant therapy presenting due to rash x3 days.  On physical exam the rash appears consistent with herpes zoster, there is only one dermatomal distribution noted.  He is not febrile, no recent antibiotic use. Labs ordered as well as x-ray to evaluate for pneumonitis given patient's  immunocompromise status./Or does not appear disseminated, there is no evidence of pneumonitis on chest x-ray.  No gross electrolyte derangement on laboratory work-up, additionally no symptoms concerning for CNS or ophthalmic involvement. I did consider admission given patient immunocompromise, however the transplant was greater than 4 years ago patient is overall well-appearing with good follow-up.  Given this I think he is appropriate for outpatient acyclovir with close follow-up.    Disposition:   After consideration of the diagnostic results and the patients response to treatment, I feel that the patent would benefit from outpatient trial acyclovir.          Final Clinical Impression(s) / ED Diagnoses Final diagnoses:  Herpes zoster without complication    Rx / DC Orders ED Discharge Orders          Ordered    acyclovir (ZOVIRAX) 400 MG tablet  2 times daily        09/26/21 0900    lidocaine (LIDODERM) 5 %  Every 24 hours        09/26/21 0900              Sherrill Raring, PA-C 09/26/21 4128    Godfrey Pick, MD 09/27/21 [phone]

## 2022-07-15 ENCOUNTER — Other Ambulatory Visit: Payer: Self-pay

## 2022-07-15 ENCOUNTER — Emergency Department (HOSPITAL_COMMUNITY): Payer: Medicare Other

## 2022-07-15 ENCOUNTER — Encounter (HOSPITAL_COMMUNITY): Payer: Self-pay

## 2022-07-15 ENCOUNTER — Emergency Department (HOSPITAL_COMMUNITY)
Admission: EM | Admit: 2022-07-15 | Discharge: 2022-07-15 | Disposition: A | Discharge status: Home / Self Care | Payer: Medicare Other | Attending: Emergency Medicine | Admitting: Emergency Medicine

## 2022-07-15 DIAGNOSIS — W108XXA Fall (on) (from) other stairs and steps, initial encounter: Secondary | ICD-10-CM | POA: Insufficient documentation

## 2022-07-15 DIAGNOSIS — Z992 Dependence on renal dialysis: Secondary | ICD-10-CM | POA: Diagnosis not present

## 2022-07-15 DIAGNOSIS — S59902A Unspecified injury of left elbow, initial encounter: Secondary | ICD-10-CM | POA: Diagnosis present

## 2022-07-15 DIAGNOSIS — S60811A Abrasion of right wrist, initial encounter: Secondary | ICD-10-CM | POA: Insufficient documentation

## 2022-07-15 DIAGNOSIS — N189 Chronic kidney disease, unspecified: Secondary | ICD-10-CM | POA: Insufficient documentation

## 2022-07-15 DIAGNOSIS — Z79899 Other long term (current) drug therapy: Secondary | ICD-10-CM | POA: Insufficient documentation

## 2022-07-15 DIAGNOSIS — M79631 Pain in right forearm: Secondary | ICD-10-CM | POA: Diagnosis not present

## 2022-07-15 DIAGNOSIS — S50312A Abrasion of left elbow, initial encounter: Secondary | ICD-10-CM | POA: Insufficient documentation

## 2022-07-15 DIAGNOSIS — M25552 Pain in left hip: Secondary | ICD-10-CM | POA: Insufficient documentation

## 2022-07-15 DIAGNOSIS — W19XXXA Unspecified fall, initial encounter: Secondary | ICD-10-CM

## 2022-07-15 DIAGNOSIS — Z8619 Personal history of other infectious and parasitic diseases: Secondary | ICD-10-CM

## 2022-07-15 DIAGNOSIS — I129 Hypertensive chronic kidney disease with stage 1 through stage 4 chronic kidney disease, or unspecified chronic kidney disease: Secondary | ICD-10-CM | POA: Diagnosis not present

## 2022-07-15 DIAGNOSIS — Z7982 Long term (current) use of aspirin: Secondary | ICD-10-CM | POA: Insufficient documentation

## 2022-07-15 NOTE — ED Provider Triage Note (Signed)
Emergency Medicine Provider Triage Evaluation Note  Siris Hoos , a 66 y.o. male  was evaluated in triage.  Pt complains of fall.  Patient states she tripped, having pain to left wrist, right elbow and low back plan.  States she had shingles previously, still has some residual pain there.  Did not hit head, did not lose consciousness.  Status post renal transplant denies being on blood thinners..  Review of Systems  Per HPI  Physical Exam  BP (!) 141/92 (BP Location: Right Arm)   Pulse 72   Temp 99 F (37.2 C) (Oral)   Resp 18   SpO2 97%  Gen:   Awake, no distress   Resp:  Normal effort  MSK:   Abrasion over dorsal aspect of right wrist, pain with ROM.  Contusion left elbow, superficial skin abrasion as well.  No midline tenderness to the lumbar spine but some paraspinal tenderness. Other:  Presents due to a grossly intact.  Lung sounds present bilaterally.   AV fistula palpable thrill right upper extremity  Medical Decision Making  Medically screening exam initiated at 6:37 PM.  Appropriate orders placed.  Desmond Astorga was informed that the remainder of the evaluation will be completed by another provider, this initial triage assessment does not replace that evaluation, and the importance of remaining in the ED until their evaluation is complete.  Exam limited as patient is fully clothed in triage.  Will start with basic plain films until patient is able to be seen in the back.  Given he denies headache/dizziness indication for CT at this time.   Sherrill Raring, PA-C 07/15/22 1839

## 2022-07-15 NOTE — Discharge Instructions (Addendum)
You were seen in the emergency department after a fall.  As we discussed your x-rays did not show any broken or dislocated bones.  You can take anti-inflammatories over-the-counter like Tylenol.  You can take up to 1000 mg every 6 hours as needed.  I suspect you have some bruising from the fall.  I recommend following up with your doctor about the pain associated with your previous shingles flare.  Continue to monitor how you're doing and return to the ER for new or worsening symptoms.

## 2022-07-15 NOTE — ED Triage Notes (Addendum)
Patient  states he fell from the top of the stairs due to wet steps and states he fell down 3 steps.  Patient c/o right wrist and right forearm pain.  Patient denies hitting his head, having LOC, or taking blood thinners.

## 2022-07-15 NOTE — ED Provider Notes (Signed)
[facility] DEPT Provider Note   CSN: [id number] Arrival date & time: 07/15/22  1743     History  Chief Complaint  Patient presents with   Jon [name] is a 66 y.o. male with history of chronic kidney disease on dialysis, GERD, hyperlipidemia, hypertension who presents the emergency department complaining of pain after a fall yesterday.  Patient states that he fell from the top of the stairs because he slipped on the wet steps, fell down about 3 steps.  He is complaining of pain in his right wrist, right forearm, left elbow, left hip.  Denies hitting his head, no loss of consciousness.  Not on chronic anticoagulation.  He had a flare of shingles back in February, and has some residual pain on the right side of his trunk.   Fall       Home Medications Prior to Admission medications   Medication Sig Start Date End Date Taking? Authorizing Provider  acyclovir (ZOVIRAX) 400 MG tablet Take 1 tablet (400 mg total) by mouth 2 (two) times daily. 09/26/21   Sherrill Raring, PA-C  albuterol (PROVENTIL HFA;VENTOLIN HFA) 108 (90 Base) MCG/ACT inhaler Inhale 2 puffs into the lungs every 6 (six) hours as needed for wheezing or shortness of breath. 01/24/17   Shela Leff, MD  amLODipine (NORVASC) 10 MG tablet Take 10 mg by mouth at bedtime.    [provider]  aspirin EC 81 MG EC tablet Take 1 tablet (81 mg total) by mouth daily. 05/30/13   Charolette Forward, MD  atorvastatin (LIPITOR) 20 MG tablet Take 20 mg by mouth daily.    [provider]  carvedilol (COREG) 25 MG tablet Take 1 tablet (25 mg total) by mouth 2 (two) times daily with a meal. 04/24/19   Mullis, Kiersten P, DO  COMBIGAN 0.2-0.5 % ophthalmic solution Place 1 drop into both eyes 2 (two) times daily. 02/08/21   [provider]  hydrALAZINE (APRESOLINE) 50 MG tablet Take 1 tablet (50 mg total) by mouth 3 (three) times daily. 01/06/19   Rhyne, Hulen Shouts, PA-C  lidocaine  (LIDODERM) 5 % Place 1 patch onto the skin daily. Remove & Discard patch within 12 hours or as directed by MD 09/26/21   Sherrill Raring, PA-C  magnesium oxide (MAG-OX) 400 MG tablet Take 800 mg by mouth 2 (two) times a day. Take 2 hrs before or after Cellcept    [provider]  mycophenolate (CELLCEPT) 200 MG/ML suspension Take 5 mLs by mouth 2 (two) times a day. 03/20/18   [provider]  PHOS-NAK 280-160-250 MG PACK Take 2 Packages by mouth 3 (three) times daily. 12/15/18   [provider]  predniSONE 5 MG/5ML solution Take 5 mLs by mouth daily. 11/18/18   [provider]  Sodium Bicarbonate POWD Take 5 mLs by mouth 2 (two) times a day. Baking soda    [provider]  sulfamethoxazole-trimethoprim (BACTRIM) 200-40 MG/5ML suspension Take 10 mLs by mouth 3 (three) times a week. M- W- F 06/18/19   [provider]  tacrolimus (PROGRAF) 1 MG capsule Take 4 mg by mouth 2 (two) times daily.     [provider]  traMADol (ULTRAM) 50 MG tablet Take 1 tablet (50 mg total) by mouth every 6 (six) hours as needed for severe pain. 04/24/19   Mullis, Kiersten P, DO  VYZULTA 0.024 % SOLN Place 1 drop into both eyes at bedtime. 02/08/21   [provider]  fluticasone (  FLOVENT HFA) 44 MCG/ACT inhaler Inhale 1 puff into the lungs 2 (two) times daily. Patient not taking: No sig reported 03/08/17 03/04/21  Chesley Mires, MD      Allergies    Patient has no known allergies.    Review of Systems   Review of Systems  Musculoskeletal:  Positive for arthralgias and back pain.  All other systems reviewed and are negative.   Physical Exam Updated Vital Signs BP (!) 141/92 (BP Location: Right Arm)   Pulse 72   Temp 99 F (37.2 C) (Oral)   Resp 18   Ht 6' (1.829 m)   Wt 107 kg   SpO2 97%   BMI 32.01 kg/m  Physical Exam Vitals and nursing note reviewed.  Constitutional:      Appearance: Normal appearance.  HENT:     Head: Normocephalic and  atraumatic.  Eyes:     Conjunctiva/sclera: Conjunctivae normal.  Pulmonary:     Effort: Pulmonary effort is normal. No respiratory distress.  Musculoskeletal:     Comments: Full passive ROM of all regions of spine.  Generalized paraspinal muscular tenderness to palpation.  No midline spinal tenderness, step-offs or crepitus.  Pelvis stable. No abdominal bruising noted. Strength 5/5 in all extremities.  Sensation intact in all extremities.  Skin:    General: Skin is warm and dry.     Comments: Superficial abrasions to dorsal right wrist and left elbow  Neurological:     Mental Status: He is alert.  Psychiatric:        Mood and Affect: Mood normal.        Behavior: Behavior normal.     ED Results / Procedures / Treatments   Labs (all labs ordered are listed, but only abnormal results are displayed) Labs Reviewed - No data to display  EKG None  Radiology DG Chest 2 View  Result Date: 07/15/2022 CLINICAL DATA:  Fall EXAM: CHEST - 2 VIEW COMPARISON:  09/26/2021 FINDINGS: Lungs are clear.  No pleural effusion or pneumothorax. The heart is normal in size. Visualized osseous structures are within normal limits. IMPRESSION: Normal chest radiographs. Electronically Signed   By: Julian Hy M.D.   On: 07/15/2022 19:29   DG Lumbar Spine Complete  Result Date: 07/15/2022 CLINICAL DATA:  Fall EXAM: LUMBAR SPINE - COMPLETE 4+ VIEW COMPARISON:  None Available. FINDINGS: Five lumbar-type vertebral bodies. Normal lumbar lordosis. No evidence of fracture or dislocation. Vertebral body heights and intervertebral disc spaces are maintained. Visualized bony pelvis appears intact. IMPRESSION: Negative. Electronically Signed   By: Julian Hy M.D.   On: 07/15/2022 19:29   DG Elbow Complete Left  Result Date: 07/15/2022 CLINICAL DATA:  Fall EXAM: LEFT ELBOW - COMPLETE 3+ VIEW COMPARISON:  None Available. FINDINGS: No fracture or dislocation is seen. The joint spaces are preserved.  Visualized soft tissues are within normal limits. No displaced elbow joint fat pads to suggest an elbow joint effusion. IMPRESSION: Negative. Electronically Signed   By: Julian Hy M.D.   On: 07/15/2022 19:28   DG Wrist Complete Right  Result Date: 07/15/2022 CLINICAL DATA:  Fall EXAM: RIGHT WRIST - COMPLETE 3+ VIEW COMPARISON:  None Available. FINDINGS: No fracture or dislocation is seen. The joint spaces are preserved. Vascular surgical clips along the ventral aspect of the distal forearm. IMPRESSION: Negative. Electronically Signed   By: Julian Hy M.D.   On: 07/15/2022 19:27    Procedures Procedures    Medications Ordered in ED Medications - No data to display  ED Course/ Medical Decision Making/ A&P                           Medical Decision Making This patient is a 66 y.o. male  who presents to the ED for concern of mechanical fall yesterday with injuries. No head trauma or LOC. Not on blood thinners.   Past Medical History / Co-morbidities: chronic kidney disease on dialysis, GERD, hyperlipidemia, hypertension  Physical Exam: Physical exam performed. The pertinent findings include: Normal vital signs. No midline spinal tenderness, step offs or crepitus. Stable pelvis. Normal ROM of all extremities and sensation intact. Abrasions to left elbow and right wrist.   Lab Tests/Imaging studies: I personally interpreted labs/imaging and the pertinent results include:  x-rays of chest, lumbar spine, left elbow and right wrist without acute fractures/dislocations. I agree with the radiologist interpretation.  Disposition: After consideration of the diagnostic results and the patients response to treatment, I feel that emergency department workup does not suggest an emergent condition requiring admission or immediate intervention beyond what has been performed at this time. The plan is: discharge to home with recommendation for RICE protocol, as patient likely has  musculoskeletal pain from the fall without acute fractures/dislocations on imaging. The patient is safe for discharge and has been instructed to return immediately for worsening symptoms, change in symptoms or any other concerns.  Final Clinical Impression(s) / ED Diagnoses Final diagnoses:  Fall, initial encounter  History of shingles    Rx / DC Orders ED Discharge Orders     None      Portions of this report may have been transcribed using voice recognition software. Every effort was made to ensure accuracy; however, inadvertent computerized transcription errors may be present.    Estill Cotta 07/15/22 2152    Valarie Merino, MD 07/15/22 2224

## 2022-09-29 IMAGING — DX DG CHEST 2V
2 series · 2 of 2 positions shown · non-contrast
Comparison: 02/22/2017

CLINICAL DATA: Chest and back pain.

EXAM:
CHEST - 2 VIEW

[chest pa]
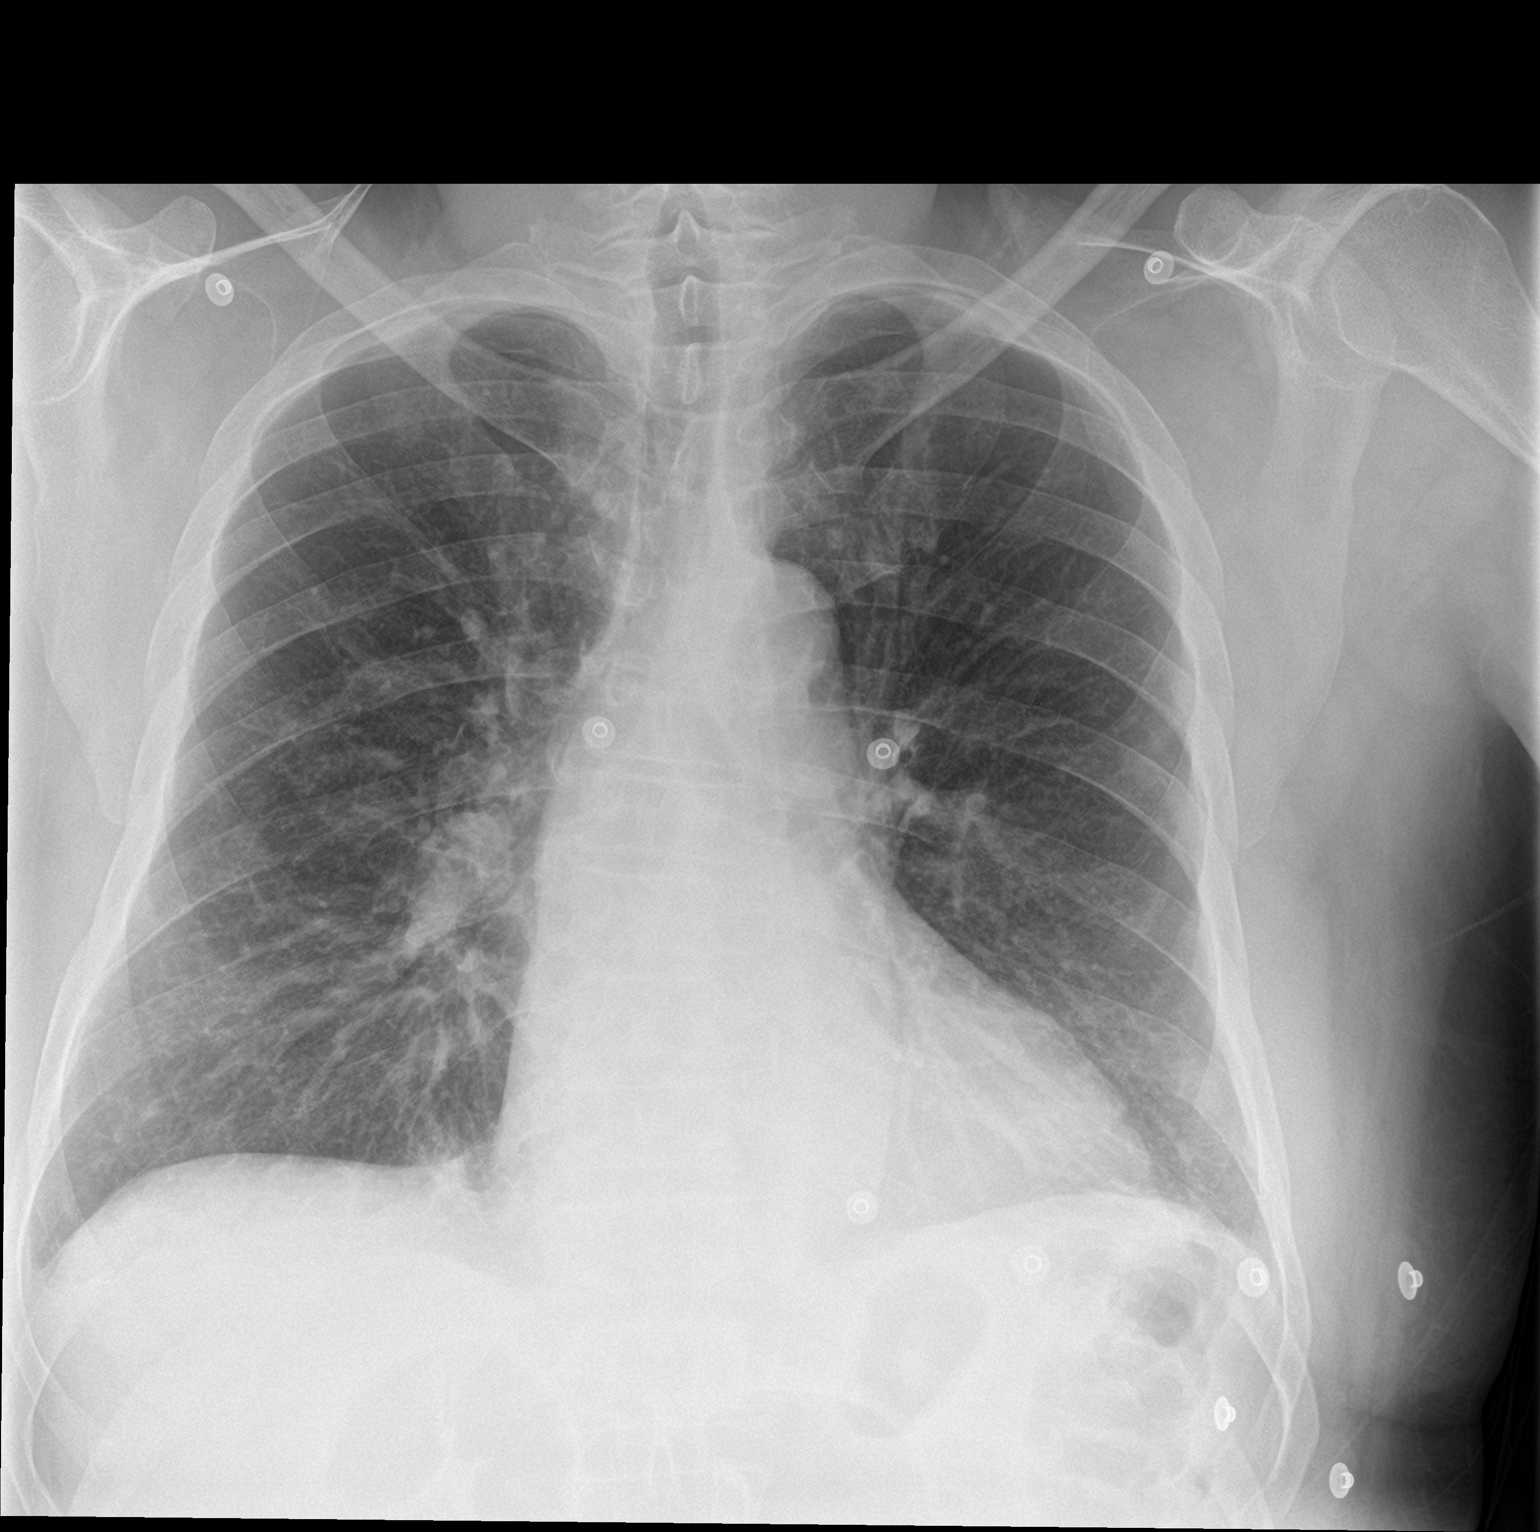

[chest lat]
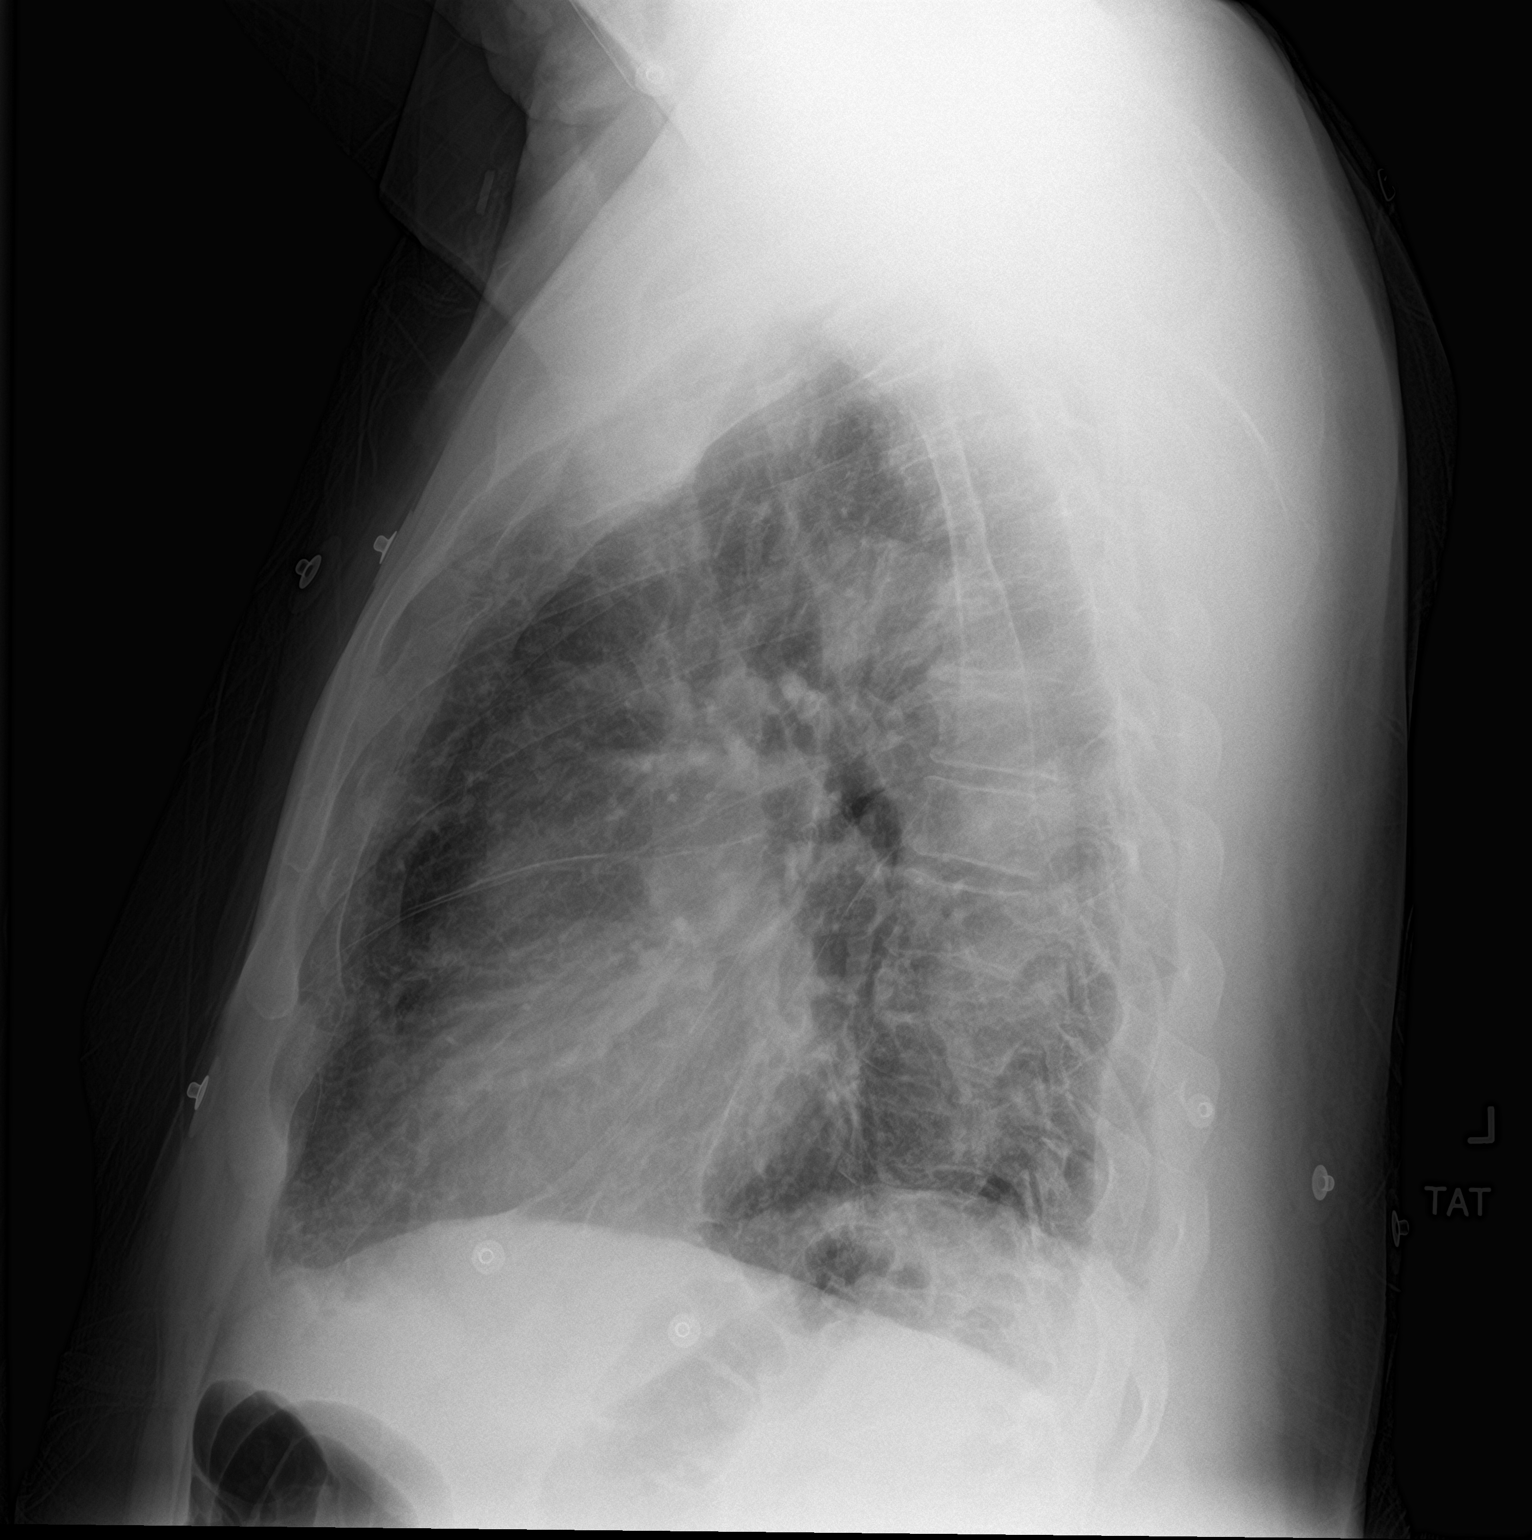

[2 of 2 positions shown; findings below may reference images not displayed]

FINDINGS: Prominent central vascular structures are similar to the previous
examination. No overt pulmonary edema or focal airspace disease.
Heart size is normal. No large pleural effusions. No acute bone
abnormality. Negative for a pneumothorax.
IMPRESSION: No active cardiopulmonary disease.

## 2022-11-05 ENCOUNTER — Encounter: Payer: Self-pay | Admitting: Neurology

## 2022-11-05 ENCOUNTER — Ambulatory Visit (INDEPENDENT_AMBULATORY_CARE_PROVIDER_SITE_OTHER): Payer: 59 | Admitting: Neurology

## 2022-11-05 VITALS — BP 124/72 | HR 70 | Ht 72.0 in | Wt 234.5 lb

## 2022-11-05 DIAGNOSIS — B0229 Other postherpetic nervous system involvement: Secondary | ICD-10-CM

## 2022-11-05 MED ORDER — DICLOFENAC SODIUM 1 % EX CREA: TOPICAL_CREAM | CUTANEOUS | 11 refills | Status: DC

## 2022-11-05 MED ORDER — OXCARBAZEPINE 300 MG/5ML PO SUSP: 300.0000 mg | Freq: Two times a day (BID) | ORAL | 12 refills | Status: DC

## 2022-11-05 MED ORDER — LIDOCAINE-PRILOCAINE 2.5-2.5 % EX CREA: 1.0000 | TOPICAL_CREAM | CUTANEOUS | 11 refills | Status: DC | PRN

## 2022-11-05 NOTE — Progress Notes (Signed)
Chief Complaint  Patient presents with   New Patient (Initial Visit)    Rm 15. Accompanied by sister. NP Paper referral for Postherpetic neuralgia, ? botox injections / Jon [name] [facility].      ASSESSMENT AND PLAN  Jon [name] is a 67 y.o. male   Postherpetic neuralgia involving right upper thoracic dermatome  Tried and failed multiple medications in the past including gabapentin, Lyrica, Cymbalta,  Will try Trileptal 300 mg twice a day,  Diclofenac gel mixed with Emla gel as needed  Return To Clinic With NP In 4-6 Months   DIAGNOSTIC DATA (LABS, IMAGING, TESTING) - I reviewed patient records, labs, notes, testing and imaging myself where available. Laboratory evaluation from October 2023: Creatinine 1.93, EGFR 38, otherwise normal CMP, CBC hemoglobin of 13.5, sodium 139  MEDICAL HISTORY:  Jon [name] is a 67 year old right-handed male, accompanied by his sister at visit on November 05, 2022, seen in request by his primary care nurse practitioner Jon [name], Jon [name] for evaluation of postherpetic neuralgia   I reviewed and summarized the referring note. PMHX HTN Kid[facility], Before that he was on HD for 12 years. HLD  He is a native of [place], has been states since 1982, now on disability due to his medical issues, lives alone  He suffered a severe case of shingles outbreak in February 2023, involving right upper chest, T2-T6 region, blister gradually improved, but he has significant neuropathic pain ever since, complains of burning, shooting sensation, skin sensitivity, hard to sleep, he has to hold his T-shirt up to avoid the cloth touching his skin,  Reviewing the chart, he has tried Lyrica, gabapentin, Cymbalta, with no help at all   PHYSICAL EXAM:   Vitals:   11/05/22 0823  BP: 124/72  Pulse: 70  Weight: 234 lb 8 oz (106.4 kg)  Height: 6' (1.829 m)   Not recorded     Body mass index is 31.8 kg/m.  PHYSICAL EXAMNIATION:  Gen:  NAD, conversant, well nourised, well groomed                     Cardiovascular: Regular rate rhythm, no peripheral edema, warm, nontender. Eyes: Conjunctivae clear without exudates or hemorrhage Neck: Supple, no carotid bruits. Pulmonary: Clear to auscultation bilaterally   NEUROLOGICAL EXAM:  MENTAL STATUS: Speech/cognition: Awake, alert, oriented to history taking and casual conversation CRANIAL NERVES: CN II: Visual fields are full to confrontation. Pupils are round equal and briskly reactive to light. CN III, IV, VI: extraocular movement are normal. No ptosis. CN V: Facial sensation is intact to light touch CN VII: Face is symmetric with normal eye closure  CN VIII: Hearing is normal to causal conversation. CN IX, X: Phonation is normal. CN XI: Head turning and shoulder shrug are intact  MOTOR: There is no pronator drift of out-stretched arms. Muscle bulk and tone are normal. Muscle strength is normal.  REFLEXES: Reflexes are 1 and symmetric at the biceps, triceps, knees, and ankles. Plantar responses are flexor.  SENSORY: Skin sensitivity at right T2-T6 region, there are dark skin mark from previous shingles outbreak  COORDINATION: There is no trunk or limb dysmetria noted.  GAIT/STANCE: Posture is normal. Gait is steady with    REVIEW OF SYSTEMS:  Full 14 system review of systems performed and notable only for as above All other review of systems were negative.   ALLERGIES: No Known Allergies  HOME MEDICATIONS: Current Outpatient Medications  Medication Sig  Dispense Refill   albuterol (PROVENTIL HFA;VENTOLIN HFA) 108 (90 Base) MCG/ACT inhaler Inhale 2 puffs into the lungs every 6 (six) hours as needed for wheezing or shortness of breath. 1 Inhaler 2   amLODipine (NORVASC) 10 MG tablet Take 10 mg by mouth at bedtime.     aspirin EC 81 MG EC tablet Take 1 tablet (81 mg total) by mouth daily. 30 tablet 3   carvedilol (COREG) 25 MG tablet Take 1 tablet (25 mg total)  by mouth 2 (two) times daily with a meal. 60 tablet 0   hydrALAZINE (APRESOLINE) 50 MG tablet Take 1 tablet (50 mg total) by mouth 3 (three) times daily.     magnesium oxide (MAG-OX) 400 MG tablet Take 800 mg by mouth 2 (two) times a day. Take 2 hrs before or after Cellcept     mycophenolate (CELLCEPT) 200 MG/ML suspension Take 5 mLs by mouth 2 (two) times a day.     PHOS-NAK 280-160-250 MG PACK Take 2 Packages by mouth 3 (three) times daily.     predniSONE 5 MG/5ML solution Take 5 mLs by mouth daily.     sulfamethoxazole-trimethoprim (BACTRIM) 200-40 MG/5ML suspension Take 10 mLs by mouth 3 (three) times a week. M- W- F     tacrolimus (PROGRAF) 1 MG capsule Take 4 mg by mouth 2 (two) times daily.      No current facility-administered medications for this visit.    PAST MEDICAL HISTORY: Past Medical History:  Diagnosis Date   Chronic kidney disease    Dialysis patient    Dyspnea    GERD (gastroesophageal reflux disease)    History of blood transfusion    Hyperlipidemia    takes Atorvastatin daily   Hypertension    takes Amlodipine,Losartan,and Labetalol daily   Myocardial infarction    lived in [place] maybe 7 years ago   Small bowel obstruction     PAST SURGICAL HISTORY: Past Surgical History:  Procedure Laterality Date   ARTERIOVENOUS GRAFT PLACEMENT     AV FISTULA PLACEMENT Right 10/25/2015   Procedure: ARTERIOVENOUS (AV) FISTULA CREATION RIGHT FOREARM;  Surgeon: Chuck Hint, MD;  Location: [facility] OR;  Service: Vascular;  Laterality: Right;   BOTOX INJECTION N/A 08/07/2021   Procedure: cystoscopy BOTOX INJECTION;  Surgeon: Crista Elliot, MD;  Location: [facility] ORS;  Service: Urology;  Laterality: N/A;   CARDIAC CATHETERIZATION N/A 05/05/2015   Procedure: Left Heart Cath and Coronary Angiography;  Surgeon: Rinaldo Cloud, MD;  Location: [facility] INVASIVE CV LAB;  Service: Cardiovascular;  Laterality: N/A;   LIGATION OF ARTERIOVENOUS  FISTULA Left 01/06/2019   Procedure: EXCISION OF  ANEURYSMAL  ARTERIOVENOUS  FISTULA LEFT ARM;  Surgeon: Chuck Hint, MD;  Location[facility] OR;  Service: Vascular;  Laterality: Left;   PERIPHERAL VASCULAR CATHETERIZATION N/A 08/18/2015   Procedure: Fistulagram;  Surgeon: Fransisco Hertz, MD;  Location[facility]VAS[facility] CV LAB;  Service: Cardiovascular;  Laterality: N/A;   PERIPHERAL VASCULAR CATHETERIZATION Right 04/19/2016   Procedure: Fistulagram;  Surgeon: Fransisco Hertz, MD;  Location: [facility] INVASIVE CV LAB;  Service: Cardiovascular;  Laterality: Right;   RESECTION OF ARTERIOVENOUS FISTULA ANEURYSM Left 02/05/2013   Procedure: REPAIR OF ANEURYSM OF LEFT ARM ARTERIOVENOUS FISTULA ;  Surgeon: Nada Libman, MD;  Location: [facility] OR;  Service: Vascular;  Laterality: Left;   RESECTION OF ARTERIOVENOUS FISTULA ANEURYSM Left 03/08/2015   Procedure: REPAIR OF LEFT ARTERIOVENOUS FISTULA PSEUDOANEURYSM;  Surgeon: Chuck Hint, MD;  Location: [facility] OR;  Service:  Vascular;  Laterality: Left;   REVISON OF ARTERIOVENOUS FISTULA Left 06/02/2015   Procedure: PLICATION OF A LARGE ANEURYSM LEFT UPPER ARM BRACHIO-CEPHALIC ARTERIOVENOUS FISTULA ;  Surgeon: Chuck Hint, MD;  Location: [facility] OR;  Service: Vascular;  Laterality: Left;   SMALL INTESTINE SURGERY      FAMILY HISTORY: Family History  Problem Relation Age of Onset   Hypertension Mother    Other Mother        varicose veins   Aneurysm Mother    Diabetes Father    Hypertension Father    Heart attack Father    Hypertension Sister    Other Sister        varicose veins   Other Brother        varicose veins    SOCIAL HISTORY: Social History   Socioeconomic History   Marital status: Married    Spouse name: Not on file   Number of children: 4   Years of education: Not on file   Highest education level: Not on file  Occupational History   Occupation: DISABILITY    Employer: UNEMPLOYED  Tobacco Use   Smoking status: Never   Smokeless tobacco: Never  Vaping Use   Vaping Use: Never used   Substance and Sexual Activity   Alcohol use: No    Alcohol/week: 0.0 standard drinks of alcohol   Drug use: No   Sexual activity: Not on file  Other Topics Concern   Not on file  Social History Narrative   Not on file   Social Determinants of Health   Financial Resource Strain: Not on file  Food Insecurity: Not on file  Transportation Needs: Not on file  Physical Activity: Not on file  Stress: Not on file  Social Connections: Not on file  Intimate Partner Violence: Not on file      Levert Feinstein, M.D. Ph.D.  [facility] [address], [address] [place], [place] [address] Ph: [phone] Fax: [phone]  CC:  Jon Back, NP [address] [place],  [place] [address]  Jon Back, NP

## 2023-01-02 ENCOUNTER — Emergency Department (HOSPITAL_COMMUNITY): Payer: 59

## 2023-01-02 ENCOUNTER — Encounter (HOSPITAL_COMMUNITY): Payer: Self-pay | Admitting: Internal Medicine

## 2023-01-02 ENCOUNTER — Inpatient Hospital Stay (HOSPITAL_COMMUNITY)
Admission: EM | Admit: 2023-01-02 | Discharge: 2023-01-06 | DRG: 311 | Disposition: A | Discharge status: Home / Self Care | Payer: 59 | Attending: Internal Medicine | Admitting: Internal Medicine

## 2023-01-02 ENCOUNTER — Other Ambulatory Visit: Payer: Self-pay

## 2023-01-02 DIAGNOSIS — Z79621 Long term (current) use of calcineurin inhibitor: Secondary | ICD-10-CM | POA: Diagnosis not present

## 2023-01-02 DIAGNOSIS — R7989 Other specified abnormal findings of blood chemistry: Secondary | ICD-10-CM | POA: Diagnosis not present

## 2023-01-02 DIAGNOSIS — B029 Zoster without complications: Secondary | ICD-10-CM | POA: Insufficient documentation

## 2023-01-02 DIAGNOSIS — T8619 Other complication of kidney transplant: Secondary | ICD-10-CM | POA: Diagnosis present

## 2023-01-02 DIAGNOSIS — I129 Hypertensive chronic kidney disease with stage 1 through stage 4 chronic kidney disease, or unspecified chronic kidney disease: Secondary | ICD-10-CM | POA: Diagnosis present

## 2023-01-02 DIAGNOSIS — R072 Precordial pain: Secondary | ICD-10-CM | POA: Diagnosis not present

## 2023-01-02 DIAGNOSIS — I161 Hypertensive emergency: Secondary | ICD-10-CM | POA: Diagnosis present

## 2023-01-02 DIAGNOSIS — E876 Hypokalemia: Secondary | ICD-10-CM | POA: Diagnosis present

## 2023-01-02 DIAGNOSIS — D631 Anemia in chronic kidney disease: Secondary | ICD-10-CM | POA: Diagnosis present

## 2023-01-02 DIAGNOSIS — I480 Paroxysmal atrial fibrillation: Secondary | ICD-10-CM | POA: Diagnosis present

## 2023-01-02 DIAGNOSIS — K219 Gastro-esophageal reflux disease without esophagitis: Secondary | ICD-10-CM | POA: Diagnosis present

## 2023-01-02 DIAGNOSIS — Z8619 Personal history of other infectious and parasitic diseases: Secondary | ICD-10-CM | POA: Diagnosis not present

## 2023-01-02 DIAGNOSIS — I252 Old myocardial infarction: Secondary | ICD-10-CM

## 2023-01-02 DIAGNOSIS — I4891 Unspecified atrial fibrillation: Secondary | ICD-10-CM

## 2023-01-02 DIAGNOSIS — I2489 Other forms of acute ischemic heart disease: Principal | ICD-10-CM | POA: Diagnosis present

## 2023-01-02 DIAGNOSIS — E669 Obesity, unspecified: Secondary | ICD-10-CM | POA: Diagnosis present

## 2023-01-02 DIAGNOSIS — I10 Essential (primary) hypertension: Secondary | ICD-10-CM | POA: Diagnosis not present

## 2023-01-02 DIAGNOSIS — Z940 Kidney transplant status: Secondary | ICD-10-CM

## 2023-01-02 DIAGNOSIS — Z8249 Family history of ischemic heart disease and other diseases of the circulatory system: Secondary | ICD-10-CM | POA: Diagnosis not present

## 2023-01-02 DIAGNOSIS — Z7951 Long term (current) use of inhaled steroids: Secondary | ICD-10-CM | POA: Diagnosis not present

## 2023-01-02 DIAGNOSIS — Z833 Family history of diabetes mellitus: Secondary | ICD-10-CM | POA: Diagnosis not present

## 2023-01-02 DIAGNOSIS — B0229 Other postherpetic nervous system involvement: Secondary | ICD-10-CM | POA: Diagnosis present

## 2023-01-02 DIAGNOSIS — I214 Non-ST elevation (NSTEMI) myocardial infarction: Secondary | ICD-10-CM | POA: Diagnosis present

## 2023-01-02 DIAGNOSIS — Z79899 Other long term (current) drug therapy: Secondary | ICD-10-CM | POA: Diagnosis not present

## 2023-01-02 DIAGNOSIS — D696 Thrombocytopenia, unspecified: Secondary | ICD-10-CM | POA: Diagnosis present

## 2023-01-02 DIAGNOSIS — N1831 Chronic kidney disease, stage 3a: Secondary | ICD-10-CM | POA: Diagnosis present

## 2023-01-02 DIAGNOSIS — I169 Hypertensive crisis, unspecified: Secondary | ICD-10-CM | POA: Diagnosis not present

## 2023-01-02 DIAGNOSIS — E785 Hyperlipidemia, unspecified: Secondary | ICD-10-CM | POA: Diagnosis present

## 2023-01-02 DIAGNOSIS — Y830 Surgical operation with transplant of whole organ as the cause of abnormal reaction of the patient, or of later complication, without mention of misadventure at the time of the procedure: Secondary | ICD-10-CM | POA: Diagnosis present

## 2023-01-02 DIAGNOSIS — I493 Ventricular premature depolarization: Secondary | ICD-10-CM | POA: Diagnosis present

## 2023-01-02 DIAGNOSIS — Z6833 Body mass index (BMI) 33.0-33.9, adult: Secondary | ICD-10-CM

## 2023-01-02 DIAGNOSIS — Z79624 Long term (current) use of inhibitors of nucleotide synthesis: Secondary | ICD-10-CM

## 2023-01-02 DIAGNOSIS — I472 Ventricular tachycardia, unspecified: Secondary | ICD-10-CM | POA: Diagnosis present

## 2023-01-02 DIAGNOSIS — N179 Acute kidney failure, unspecified: Secondary | ICD-10-CM | POA: Insufficient documentation

## 2023-01-02 DIAGNOSIS — Z7982 Long term (current) use of aspirin: Secondary | ICD-10-CM | POA: Diagnosis not present

## 2023-01-02 DIAGNOSIS — N189 Chronic kidney disease, unspecified: Secondary | ICD-10-CM | POA: Diagnosis present

## 2023-01-02 DIAGNOSIS — I2510 Atherosclerotic heart disease of native coronary artery without angina pectoris: Secondary | ICD-10-CM | POA: Diagnosis present

## 2023-01-02 LAB — COMPREHENSIVE METABOLIC PANEL
ALT: 15 U/L (ref 0–44)
AST: 26 U/L (ref 15–41)
Albumin: 4 g/dL (ref 3.5–5.0)
Alkaline Phosphatase: 66 U/L (ref 38–126)
Anion gap: 8 (ref 5–15)
BUN: 14 mg/dL (ref 8–23)
CO2: 28 mmol/L (ref 22–32)
Calcium: 9.3 mg/dL (ref 8.9–10.3)
Chloride: 103 mmol/L (ref 98–111)
Creatinine, Ser: 1.55 mg/dL — ABNORMAL HIGH (ref 0.61–1.24)
GFR, Estimated: 50 mL/min — ABNORMAL LOW (ref >60)
Glucose, Bld: 154 mg/dL — ABNORMAL HIGH (ref 70–99)
Potassium: 3.4 mmol/L — ABNORMAL LOW (ref 3.5–5.1)
Sodium: 139 mmol/L (ref 135–145)
Total Bilirubin: 0.3 mg/dL (ref 0.3–1.2)
Total Protein: 6.8 g/dL (ref 6.5–8.1)

## 2023-01-02 LAB — CBC
HCT: 42.1 % (ref 39.0–52.0)
Hemoglobin: 13.1 g/dL (ref 13.0–17.0)
MCH: 27.5 pg (ref 26.0–34.0)
MCHC: 31.1 g/dL (ref 30.0–36.0)
MCV: 88.4 fL (ref 80.0–100.0)
Platelets: 129 10*3/uL — ABNORMAL LOW (ref 150–400)
RBC: 4.76 MIL/uL (ref 4.22–5.81)
RDW: 13.6 % (ref 11.5–15.5)
WBC: 6 10*3/uL (ref 4.0–10.5)
nRBC: 0 % (ref 0.0–0.2)

## 2023-01-02 LAB — MRSA NEXT GEN BY PCR, NASAL: MRSA by PCR Next Gen: NOT DETECTED

## 2023-01-02 LAB — TROPONIN I (HIGH SENSITIVITY)
Troponin I (High Sensitivity): 124 ng/L (ref <18)
Troponin I (High Sensitivity): 120 ng/L (ref <18)
Troponin I (High Sensitivity): 106 ng/L (ref <18)
Troponin I (High Sensitivity): 103 ng/L (ref <18)

## 2023-01-02 LAB — BRAIN NATRIURETIC PEPTIDE: B Natriuretic Peptide: 543.6 pg/mL — ABNORMAL HIGH (ref 0.0–100.0)

## 2023-01-02 LAB — HIV ANTIBODY (ROUTINE TESTING W REFLEX): HIV Screen 4th Generation wRfx: NONREACTIVE

## 2023-01-02 LAB — PROTIME-INR
INR: 1.2 (ref 0.8–1.2)
Prothrombin Time: 14.9 seconds (ref 11.4–15.2)

## 2023-01-02 LAB — D-DIMER, QUANTITATIVE: D-Dimer, Quant: 0.27 ug/mL-FEU (ref 0.00–0.50)

## 2023-01-02 LAB — TSH: TSH: 3.148 u[IU]/mL (ref 0.350–4.500)

## 2023-01-02 LAB — MAGNESIUM: Magnesium: 1.8 mg/dL (ref 1.7–2.4)

## 2023-01-02 LAB — APTT: aPTT: 37 seconds — ABNORMAL HIGH (ref 24–36)

## 2023-01-02 MED ORDER — MORPHINE SULFATE (PF) 2 MG/ML IV SOLN
2.0000 mg | INTRAVENOUS | Status: DC | PRN
  Administered 2023-01-02 – 2023-01-04 (×5): 2 mg via INTRAVENOUS
  Filled 2023-01-02 (×5): qty 1

## 2023-01-02 MED ORDER — MORPHINE SULFATE (PF) 4 MG/ML IV SOLN
4.0000 mg | Freq: Once | INTRAVENOUS | Status: AC
  Administered 2023-01-02: 4 mg via INTRAVENOUS
  Filled 2023-01-02: qty 1

## 2023-01-02 MED ORDER — ASPIRIN 81 MG PO TBEC
81.0000 mg | DELAYED_RELEASE_TABLET | Freq: Every day | ORAL | Status: DC
  Administered 2023-01-02 – 2023-01-03 (×2): 81 mg via ORAL
  Filled 2023-01-02 (×2): qty 1

## 2023-01-02 MED ORDER — PIPERACILLIN-TAZOBACTAM 3.375 G IVPB 30 MIN: 3.3750 g | Freq: Once | INTRAVENOUS | Status: DC

## 2023-01-02 MED ORDER — ACETAMINOPHEN 325 MG PO TABS
650.0000 mg | ORAL_TABLET | Freq: Four times a day (QID) | ORAL | Status: DC | PRN
  Administered 2023-01-02 – 2023-01-04 (×3): 650 mg via ORAL
  Filled 2023-01-02 (×3): qty 2

## 2023-01-02 MED ORDER — METOPROLOL TARTRATE 25 MG PO TABS
25.0000 mg | ORAL_TABLET | Freq: Two times a day (BID) | ORAL | Status: DC
  Administered 2023-01-02 – 2023-01-03 (×2): 25 mg via ORAL
  Filled 2023-01-02 (×2): qty 1

## 2023-01-02 MED ORDER — ONDANSETRON HCL 4 MG/2ML IJ SOLN: 4.0000 mg | Freq: Four times a day (QID) | INTRAMUSCULAR | Status: DC | PRN

## 2023-01-02 MED ORDER — ORAL CARE MOUTH RINSE: 15.0000 mL | OROMUCOSAL | Status: DC | PRN

## 2023-01-02 MED ORDER — METOPROLOL TARTRATE 5 MG/5ML IV SOLN
5.0000 mg | Freq: Once | INTRAVENOUS | Status: AC
  Administered 2023-01-02: 5 mg via INTRAVENOUS
  Filled 2023-01-02: qty 5

## 2023-01-02 MED ORDER — ONDANSETRON HCL 4 MG PO TABS: 4.0000 mg | ORAL_TABLET | Freq: Four times a day (QID) | ORAL | Status: DC | PRN

## 2023-01-02 MED ORDER — NITROGLYCERIN 0.4 MG SL SUBL
SUBLINGUAL_TABLET | SUBLINGUAL | Status: AC
  Filled 2023-01-02: qty 1

## 2023-01-02 MED ORDER — NITROGLYCERIN 0.4 MG SL SUBL
0.4000 mg | SUBLINGUAL_TABLET | SUBLINGUAL | Status: DC | PRN
  Administered 2023-01-02 – 2023-01-03 (×3): 0.4 mg via SUBLINGUAL

## 2023-01-02 MED ORDER — PNEUMOCOCCAL 20-VAL CONJ VACC 0.5 ML IM SUSY: 0.5000 mL | PREFILLED_SYRINGE | INTRAMUSCULAR | Status: DC | PRN

## 2023-01-02 MED ORDER — VANCOMYCIN HCL 2000 MG/400ML IV SOLN
2000.0000 mg | Freq: Once | INTRAVENOUS | Status: DC
  Filled 2023-01-02: qty 400

## 2023-01-02 MED ORDER — ONDANSETRON HCL 4 MG/2ML IJ SOLN
4.0000 mg | Freq: Once | INTRAMUSCULAR | Status: AC
  Administered 2023-01-02: 4 mg via INTRAVENOUS
  Filled 2023-01-02: qty 2

## 2023-01-02 MED ORDER — HEPARIN (PORCINE) 25000 UT/250ML-% IV SOLN
1500.0000 [IU]/h | INTRAVENOUS | Status: DC
  Administered 2023-01-02 – 2023-01-04 (×3): 1300 [IU]/h via INTRAVENOUS
  Administered 2023-01-05: 1350 [IU]/h via INTRAVENOUS
  Filled 2023-01-02 (×4): qty 250

## 2023-01-02 MED ORDER — AMLODIPINE BESYLATE 5 MG PO TABS
10.0000 mg | ORAL_TABLET | Freq: Every day | ORAL | Status: DC
  Administered 2023-01-02 – 2023-01-06 (×5): 10 mg via ORAL
  Filled 2023-01-02 (×3): qty 1
  Filled 2023-01-02: qty 2
  Filled 2023-01-02: qty 1

## 2023-01-02 MED ORDER — HEPARIN BOLUS VIA INFUSION
5000.0000 [IU] | Freq: Once | INTRAVENOUS | Status: AC
  Administered 2023-01-02: 5000 [IU] via INTRAVENOUS
  Filled 2023-01-02: qty 5000

## 2023-01-02 MED ORDER — ACETAMINOPHEN 650 MG RE SUPP: 650.0000 mg | Freq: Four times a day (QID) | RECTAL | Status: DC | PRN

## 2023-01-02 MED ORDER — CHLORHEXIDINE GLUCONATE CLOTH 2 % EX PADS
6.0000 | MEDICATED_PAD | Freq: Every day | CUTANEOUS | Status: DC
  Administered 2023-01-02 – 2023-01-04 (×3): 6 via TOPICAL

## 2023-01-02 MED ORDER — HYDRALAZINE HCL 20 MG/ML IJ SOLN
10.0000 mg | Freq: Four times a day (QID) | INTRAMUSCULAR | Status: DC | PRN
  Administered 2023-01-03: 10 mg via INTRAVENOUS
  Filled 2023-01-02: qty 1

## 2023-01-02 NOTE — Discharge Instructions (Addendum)

## 2023-01-02 NOTE — H&P (Signed)
History and Physical  Tris Gowder ZOX:[id number] DOB: 1955-12-14 DOA: 01/02/2023  PCP: Loura Back, NP   Chief Complaint: Chest pressure  HPI: Jon [name] is a 67 y.o. male with medical history significant for coronary artery disease, renal transplant 2018, difficult to control hypertension being admitted to the hospital with new onset atrial fibrillation, chest discomfort and NSTEMI.  Patient states he has been in his usual state of health until last night, when he had sudden onset of chest pain and pressure.  It seems to come and go, not clearly associated with deep breaths, or exertion.  Denies any recent cough, fevers, chills, trauma.  Presented to the ER for evaluation, where he was found to be in atrial fibrillation with heart rate in the 80s.  Troponin elevated as noted below.  ER provider discussed with on-call cardiology, who recommended IV metoprolol push.  This was given, and on my evaluation of the patient he is currently in normal sinus rhythm.  Still has intermittent chest pain.  Denies radiation.  Does not think he has had chest discomfort like this before.  Says that he had a heart attack about 7 years ago in [place], has never had a stent or cardiac surgery.  Review of Systems: Please see HPI for pertinent positives and negatives. A complete 10 system review of systems are otherwise negative.  Past Medical History:  Diagnosis Date   Chronic kidney disease    Dialysis patient (HCC)    Dyspnea    GERD (gastroesophageal reflux disease)    History of blood transfusion    Hyperlipidemia    takes Atorvastatin daily   Hypertension    takes Amlodipine,Losartan,and Labetalol daily   Myocardial infarction [facility])    lived in [place] maybe 7 years ago   Small bowel obstruction [facility])    Past Surgical History:  Procedure Laterality Date   ARTERIOVENOUS GRAFT PLACEMENT     AV FISTULA PLACEMENT Right 10/25/2015   Procedure: ARTERIOVENOUS (AV) FISTULA CREATION RIGHT FOREARM;  Surgeon:  Chuck Hint, MD;  Location: [facility] OR;  Service: Vascular;  Laterality: Right;   BOTOX INJECTION N/A 08/07/2021   Procedure: cystoscopy BOTOX INJECTION;  Surgeon: Crista Elliot, MD;  Location: [facility] ORS;  Service: Urology;  Laterality: N/A;   CARDIAC CATHETERIZATION N/A 05/05/2015   Procedure: Left Heart Cath and Coronary Angiography;  Surgeon: Rinaldo Cloud, MD;  Location: [facility] INVASIVE CV LAB;  Service: Cardiovascular;  Laterality: N/A;   LIGATION OF ARTERIOVENOUS  FISTULA Left 01/06/2019   Procedure: EXCISION OF ANEURYSMAL  ARTERIOVENOUS  FISTULA LEFT ARM;  Surgeon: Chuck Hint, MD;  Location: [facility] OR;  Service: Vascular;  Laterality: Left;   PERIPHERAL VASCULAR CATHETERIZATION N/A 08/18/2015   Procedure: Fistulagram;  Surgeon: Fransisco Hertz, MD;  Location: [facility] INVASIVE CV LAB;  Service: Cardiovascular;  Laterality: N/A;   PERIPHERAL VASCULAR CATHETERIZATION Right 04/19/2016   Procedure: Fistulagram;  Surgeon: Fransisco Hertz, MD;  Location: [facility] INVASIVE CV LAB;  Service: Cardiovascular;  Laterality: Right;   RESECTION OF ARTERIOVENOUS FISTULA ANEURYSM Left 02/05/2013   Procedure: REPAIR OF ANEURYSM OF LEFT ARM ARTERIOVENOUS FISTULA ;  Surgeon: Nada Libman, MD;  Location: [facility] OR;  Service: Vascular;  Laterality: Left;   RESECTION OF ARTERIOVENOUS FISTULA ANEURYSM Left 03/08/2015   Procedure: REPAIR OF LEFT ARTERIOVENOUS FISTULA PSEUDOANEURYSM;  Surgeon: Chuck Hint, MD;  Location: [facility] OR;  Service: Vascular;  Laterality: Left;   REVISON OF ARTERIOVENOUS FISTULA Left 06/02/2015   Procedure: PLICATION OF A LARGE  ANEURYSM LEFT UPPER ARM BRACHIO-CEPHALIC ARTERIOVENOUS FISTULA ;  Surgeon: Chuck Hint, MD;  Location: [facility] OR;  Service: Vascular;  Laterality: Left;   SMALL INTESTINE SURGERY      Social History:  reports that he has never smoked. He has never used smokeless tobacco. He reports that he does not drink alcohol and does not use drugs.   No Known Allergies  Family  History  Problem Relation Age of Onset   Hypertension Mother    Other Mother        varicose veins   Aneurysm Mother    Diabetes Father    Hypertension Father    Heart attack Father    Hypertension Sister    Other Sister        varicose veins   Other Brother        varicose veins     Prior to Admission medications   Medication Sig Start Date End Date Taking? Authorizing Provider  albuterol (PROVENTIL HFA;VENTOLIN HFA) 108 (90 Base) MCG/ACT inhaler Inhale 2 puffs into the lungs every 6 (six) hours as needed for wheezing or shortness of breath. 01/24/17   John Giovanni, MD  amLODipine (NORVASC) 10 MG tablet Take 10 mg by mouth at bedtime.    [provider]  aspirin EC 81 MG EC tablet Take 1 tablet (81 mg total) by mouth daily. 05/30/13   Rinaldo Cloud, MD  carvedilol (COREG) 25 MG tablet Take 1 tablet (25 mg total) by mouth 2 (two) times daily with a meal. 04/24/19   Mullis, Kiersten P, DO  Diclofenac Sodium 1 % CREA 1gram qid prn 11/05/22   Levert Feinstein, MD  hydrALAZINE (APRESOLINE) 50 MG tablet Take 1 tablet (50 mg total) by mouth 3 (three) times daily. 01/06/19   Rhyne, Ames Coupe, PA-C  lidocaine-prilocaine (EMLA) cream Apply 1 Application topically as needed. 1 gram qid prn 11/05/22   Levert Feinstein, MD  magnesium oxide (MAG-OX) 400 MG tablet Take 800 mg by mouth 2 (two) times a day. Take 2 hrs before or after Cellcept    [provider]  mycophenolate (CELLCEPT) 200 MG/ML suspension Take 5 mLs by mouth 2 (two) times a day. 03/20/18   [provider]  OXcarbazepine (TRILEPTAL) 300 MG/5ML suspension Take 5 mLs (300 mg total) by mouth 2 (two) times daily. 11/05/22   Levert Feinstein, MD  PHOS-NAK [id number] MG PACK Take 2 Packages by mouth 3 (three) times daily. 12/15/18   [provider]  predniSONE 5 MG/5ML solution Take 5 mLs by mouth daily. 11/18/18   [provider]  sulfamethoxazole-trimethoprim (BACTRIM) 200-40 MG/5ML suspension Take 10 mLs by mouth 3  (three) times a week. M- W- F 06/18/19   [provider]  tacrolimus (PROGRAF) 1 MG capsule Take 4 mg by mouth 2 (two) times daily.     [provider]  fluticasone (FLOVENT HFA) 44 MCG/ACT inhaler Inhale 1 puff into the lungs 2 (two) times daily. Patient not taking: No sig reported 03/08/17 03/04/21  Coralyn Helling, MD    Physical Exam: BP (!) 154/109 (BP Location: Left Arm)   Pulse 85   Temp (!) 97.5 F (36.4 C) (Oral)   Resp 20   SpO2 97%   General:  Alert, oriented, calm, in no acute distress resting comfortably on room air Eyes: EOMI, clear conjuctivae, white sclerea Neck: supple, no masses, trachea mildline  Cardiovascular: RRR, no murmurs or rubs, no peripheral edema  Respiratory: clear to auscultation bilaterally, no wheezes, no crackles  Abdomen: soft, nontender, nondistended, normal bowel tones heard  Skin: dry, no rashes  Musculoskeletal: no joint effusions, normal range of motion  Psychiatric: appropriate affect, normal speech  Neurologic: extraocular muscles intact, clear speech, moving all extremities with intact sensorium          Labs on Admission:  Basic Metabolic Panel: Recent Labs  Lab 01/02/23 1156 01/02/23 1204  NA  --  139  K  --  3.4*  CL  --  103  CO2  --  28  GLUCOSE  --  154*  BUN  --  14  CREATININE  --  1.55*  CALCIUM  --  9.3  MG 1.8  --    Liver Function Tests: Recent Labs  Lab 01/02/23 1204  AST 26  ALT 15  ALKPHOS 66  BILITOT 0.3  PROT 6.8  ALBUMIN 4.0   No results for input(s): "LIPASE", "AMYLASE" in the last 168 hours. No results for input(s): "AMMONIA" in the last 168 hours. CBC: Recent Labs  Lab 01/02/23 1204  WBC 6.0  HGB 13.1  HCT 42.1  MCV 88.4  PLT 129*   Cardiac Enzymes: No results for input(s): "CKTOTAL", "CKMB", "CKMBINDEX", "TROPONINI" in the last 168 hours.  BNP (last 3 results) Recent Labs    01/02/23 1356  BNP 543.6*    ProBNP (last 3 results) No results for input(s): "PROBNP" in  the last 8760 hours.  CBG: No results for input(s): "GLUCAP" in the last 168 hours.  EKG reviewed, shows atrial flutter/fibrillation, without ST changes.  Radiological Exams on Admission: DG Chest 2 View  Result Date: 01/02/2023 CLINICAL DATA:  Shortness of breath EXAM: CHEST - 2 VIEW COMPARISON:  X-ray 07/15/2022 and older FINDINGS: Normal cardiopericardial silhouette. No pneumothorax, effusion or edema. No consolidation. Overlapping cardiac leads. Films are under penetrated. IMPRESSION: No active cardiopulmonary disease. Electronically Signed   By: Karen Kays M.D.   On: 01/02/2023 12:43    Assessment/Plan This is a pleasant 67 year old gentleman with a history of renal transplant 2018, difficult to control hypertension, CAD with history of MI, being admitted to the hospital with NSTEMI, chest discomfort, and new onset atrial fibrillation.  Patient is not hypoxic, and has a normal D-dimer.    NSTEMI (non-ST elevated myocardial infarction) (HCC)-troponin 124, 120 -Inpatient admission to stepdown unit -Continue home aspirin -Continue to trend troponin -Low-dose metoprolol p.o. twice daily -Heparin drip per cardiology -Anticipate formal cardiology consultation in the morning  Atrial fibrillation-this is a new finding, he is now converted back to normal sinus rhythm -Continue low-dose metoprolol p.o. twice daily -Anticoagulation with heparin drip -Note unremarkable electrolytes, and normal TSH -2D echo  HTN (hypertension)-continue home medications once reconciled  Anemia due to chronic kidney disease  Thrombocytopenia (HCC)  Kidney transplant recipient-continue home antirejection regimen once reconciled  DVT prophylaxis: Heparin drip as above    Code Status: Full Code  Consults called: EDP discussed with cardiology, who will consult in the morning  Admission status: The appropriate patient status for this patient is INPATIENT. Inpatient status is judged to be reasonable and  necessary in order to provide the required intensity of service to ensure the patient's safety. The patient's presenting symptoms, physical exam findings, and initial radiographic and laboratory data in the context of their chronic comorbidities is felt to place them at high risk for further clinical deterioration. Furthermore, it is not anticipated that the patient will be medically stable for discharge from the hospital within 2 midnights of admission.  I certify that at the point of admission it is my clinical judgment that the patient will require inpatient hospital care spanning beyond 2 midnights from the point of admission due to high intensity of service, high risk for further deterioration and high frequency of surveillance required   Time spent: 56 minutes  Maisyn Nouri Sharlette Dense MD [facility] Pager [phone]  If 7PM-7AM, please contact night-coverage [email or web address] Password [email or web address]  01/02/2023, 5:03 PM

## 2023-01-02 NOTE — ED Provider Notes (Signed)
Care assumed from Maxwell Marion, PA-C at shift change pending cardiology recommendations and admission.  See her note for full HPI.  In short, patient is a 67 year old male with a history of kidney transplant and thrombocytopenia who presents to the ED due to chest pain and shortness of breath that started last night.  Upon arrival, patient found to be in A-fib.  No history of A-fib. Physical Exam  BP (!) 154/109 (BP Location: Left Arm)   Pulse 85   Temp (!) 97.5 F (36.4 C) (Oral)   Resp 20   SpO2 97%   Physical Exam Vitals and nursing note reviewed.  Constitutional:      General: He is not in acute distress.    Appearance: He is not ill-appearing.  HENT:     Head: Normocephalic.  Eyes:     Pupils: Pupils are equal, round, and reactive to light.  Cardiovascular:     Rate and Rhythm: Normal rate and regular rhythm.     Pulses: Normal pulses.     Heart sounds: Normal heart sounds. No murmur heard.    No friction rub. No gallop.  Pulmonary:     Effort: Pulmonary effort is normal.     Breath sounds: Normal breath sounds.  Abdominal:     General: Abdomen is flat. There is no distension.     Palpations: Abdomen is soft.     Tenderness: There is no abdominal tenderness. There is no guarding or rebound.  Musculoskeletal:        General: Normal range of motion.     Cervical back: Neck supple.  Skin:    General: Skin is warm and dry.  Neurological:     General: No focal deficit present.     Mental Status: He is alert.  Psychiatric:        Mood and Affect: Mood normal.        Behavior: Behavior normal.     Procedures  Procedures  ED Course / MDM   Clinical Course as of 01/02/23 1546  Wed Jan 02, 2023  1545 D-Dimer, Quant: 0.27 [CA]    Clinical Course User Index [CA] Mannie Stabile, PA-C   Medical Decision Making Amount and/or Complexity of Data Reviewed Labs: ordered. Decision-making details documented in ED Course. Radiology: ordered.  Risk Prescription drug  management. Decision regarding hospitalization.   D-dimer normal, low suspicion for PE.   3:46 PM reassessed patient at bedside.  Patient admits to 7/10 pain.  Awaiting cardiology recommendations.  Patient given morphine prior to my evaluation.  4:07 PM discussed with Dr. Tresa Endo with cardiology who recommends starting heparin and giving a dose of Lopressor.  Feel troponin is likely demand ischemia.  Cardiology will see patient tomorrow AM. Will consult hospitalist for admission.  4:45 PM Discussed with Dr. Erenest Blank with [facility] who agrees to admit patient.      Jesusita Oka 01/02/23 1647    Glendora Score, MD 01/02/23 2017

## 2023-01-02 NOTE — ED Provider Notes (Signed)
[place] EMERGENCY DEPARTMENT AT [facility] Provider Note   CSN: [id number] Arrival date & time: 01/02/23  1107     History  Chief Complaint  Patient presents with   Shortness of Breath    Jon [name] is a 67 y.o. male with a history of kidney transplant, hypertension, thrombocytopenia, and postherpetic neuralgia to the ED today with shortness of breath and chest pain.  Patient reports that symptoms began last night and have persisted since.  Patient reported improvement while sleeping with his head elevated and the symptoms are worsened with movement.  Denies any recent travel or surgeries.  He has had previous episodes of transient chest pain in the past as well as right lower leg edema which improves with elevation. He does not drink alcohol or use tobacco products. No other complaints or concerns at this time.    Home Medications Prior to Admission medications   Medication Sig Start Date End Date Taking? Authorizing Provider  albuterol (PROVENTIL HFA;VENTOLIN HFA) 108 (90 Base) MCG/ACT inhaler Inhale 2 puffs into the lungs every 6 (six) hours as needed for wheezing or shortness of breath. 01/24/17   John Giovanni, MD  amLODipine (NORVASC) 10 MG tablet Take 10 mg by mouth at bedtime.    [provider]  aspirin EC 81 MG EC tablet Take 1 tablet (81 mg total) by mouth daily. 05/30/13   Rinaldo Cloud, MD  carvedilol (COREG) 25 MG tablet Take 1 tablet (25 mg total) by mouth 2 (two) times daily with a meal. 04/24/19   Mullis, Kiersten P, DO  Diclofenac Sodium 1 % CREA 1gram qid prn 11/05/22   Levert Feinstein, MD  hydrALAZINE (APRESOLINE) 50 MG tablet Take 1 tablet (50 mg total) by mouth 3 (three) times daily. 01/06/19   Rhyne, Ames Coupe, PA-C  lidocaine-prilocaine (EMLA) cream Apply 1 Application topically as needed. 1 gram qid prn 11/05/22   Levert Feinstein, MD  magnesium oxide (MAG-OX) 400 MG tablet Take 800 mg by mouth 2 (two) times a day. Take 2 hrs before or after Cellcept     [provider]  mycophenolate (CELLCEPT) 200 MG/ML suspension Take 5 mLs by mouth 2 (two) times a day. 03/20/18   [provider]  OXcarbazepine (TRILEPTAL) 300 MG/5ML suspension Take 5 mLs (300 mg total) by mouth 2 (two) times daily. 11/05/22   Levert Feinstein, MD  PHOS-NAK [phone] MG PACK Take 2 Packages by mouth 3 (three) times daily. 12/15/18   [provider]  predniSONE 5 MG/5ML solution Take 5 mLs by mouth daily. 11/18/18   [provider]  sulfamethoxazole-trimethoprim (BACTRIM) 200-40 MG/5ML suspension Take 10 mLs by mouth 3 (three) times a week. M- W- F 06/18/19   [provider]  tacrolimus (PROGRAF) 1 MG capsule Take 4 mg by mouth 2 (two) times daily.     [provider]  fluticasone (FLOVENT HFA) 44 MCG/ACT inhaler Inhale 1 puff into the lungs 2 (two) times daily. Patient not taking: No sig reported 03/08/17 03/04/21  Coralyn Helling, MD      Allergies    Patient has no known allergies.    Review of Systems   Review of Systems  Respiratory:  Positive for shortness of breath.   Cardiovascular:  Positive for chest pain.  All other systems reviewed and are negative.   Physical Exam Updated Vital Signs BP (!) 154/109 (BP Location: Left Arm)   Pulse 85   Temp (!) 97.5 F (36.4 C) (Oral)   Resp 20  SpO2 97%  Physical Exam Vitals and nursing note reviewed.  Constitutional:      General: He is not in acute distress.    Appearance: Normal appearance. He is well-developed.  HENT:     Head: Normocephalic and atraumatic.     Mouth/Throat:     Mouth: Mucous membranes are moist.  Eyes:     Conjunctiva/sclera: Conjunctivae normal.     Pupils: Pupils are equal, round, and reactive to light.  Cardiovascular:     Rate and Rhythm: Normal rate and regular rhythm.     Pulses: Normal pulses.  Pulmonary:     Effort: Pulmonary effort is normal.     Breath sounds: Normal breath sounds.  Chest:     Chest wall: Tenderness present.      Comments: Tenderness to palpation of right lower chest. Patient reports that he has had pain there since shingles flare last year. Abdominal:     Palpations: Abdomen is soft.     Tenderness: There is no abdominal tenderness.  Musculoskeletal:     Right lower leg: Edema present.     Left lower leg: No edema.  Skin:    General: Skin is warm and dry.     Findings: No rash.  Neurological:     General: No focal deficit present.     Mental Status: He is alert.  Psychiatric:        Mood and Affect: Mood normal.        Behavior: Behavior normal.     ED Results / Procedures / Treatments   Labs (all labs ordered are listed, but only abnormal results are displayed) Labs Reviewed  CBC - Abnormal; Notable for the following components:      Result Value   Platelets 129 (*)    All other components within normal limits  COMPREHENSIVE METABOLIC PANEL - Abnormal; Notable for the following components:   Potassium 3.4 (*)    Glucose, Bld 154 (*)    Creatinine, Ser 1.55 (*)    GFR, Estimated 50 (*)    All other components within normal limits  BRAIN NATRIURETIC PEPTIDE - Abnormal; Notable for the following components:   B Natriuretic Peptide 543.6 (*)    All other components within normal limits  TROPONIN I (HIGH SENSITIVITY) - Abnormal; Notable for the following components:   Troponin I (High Sensitivity) 124 (*)    All other components within normal limits  TROPONIN I (HIGH SENSITIVITY) - Abnormal; Notable for the following components:   Troponin I (High Sensitivity) 120 (*)    All other components within normal limits  TSH  MAGNESIUM  D-DIMER, QUANTITATIVE    EKG None  Radiology DG Chest 2 View  Result Date: 01/02/2023 CLINICAL DATA:  Shortness of breath EXAM: CHEST - 2 VIEW COMPARISON:  X-ray 07/15/2022 and older FINDINGS: Normal cardiopericardial silhouette. No pneumothorax, effusion or edema. No consolidation. Overlapping cardiac leads. Films are under penetrated. IMPRESSION: No  active cardiopulmonary disease. Electronically Signed   By: Karen Kays M.D.   On: 01/02/2023 12:43    Procedures Procedures: not indicated.    Medications Ordered in ED Medications  morphine (PF) 4 MG/ML injection 4 mg (4 mg Intravenous Given 01/02/23 1522)  ondansetron (ZOFRAN) injection 4 mg (4 mg Intravenous Given 01/02/23 1522)    ED Course/ Medical Decision Making/ A&P Clinical Course as of 01/02/23 1548  Wed Jan 02, 2023  1545 D-Dimer, Quant: 0.27 [CA]    Clinical Course User Index [CA] Mannie Stabile, PA-C  CHA2DS2-VASc Score: 1                    Medical Decision Making Amount and/or Complexity of Data Reviewed Labs: ordered. Radiology: ordered.  This patient presents to the ED for concern of shortness of breath and chest pain, this involves an extensive number of treatment options, and is a complaint that carries with it a high risk of complications and morbidity.   Differential diagnosis includes: ACS, pulmonary embolism, aortic dissection, pneumonia, pneumothorax, pericarditis, pleural effusion, CHF.   Co morbidities that complicate the patient evaluation  Kidney transplant recipient Hypertension Thrombocytopenia Postherpetic neuralgia   Cardiac Monitoring / EKG:  The patient was maintained on a cardiac monitor.  I personally viewed and interpreted the cardiac monitored which showed: new onset atrial fibrillation with a heart rate of 82 bpm.   Lab Tests:  I ordered and personally interpreted labs.  The pertinent results include:   Initial troponin of 124, repeat troponin of 120. BNP of 543.6 D-dimer of 0.27. PE unlikely. CBC,CMP, and magnesium within normal limits. GFR of 50. Glucose of 154.   Imaging Studies ordered:  I ordered imaging studies including CXR  I independently visualized and interpreted imaging which showed no active cardiopulmonary disease I agree with the radiologist interpretation PTX, PNA, pulmonary effusion  unlikely Patient's last cardiac catheterization was in 2016 and did not show any significant stenosis.   Consultations Obtained:  Cardiology consult. Claudette Stapler taking over care at time of shift change.   Problem List / ED Course / Critical interventions / Medication management  Dyspnea, chest pain, new onset atrial fibrillation I have reviewed the patients home medicines and have made adjustments as needed    Test / Admission - Considered:  Pending cardiology recommendations. Most likely will get admitted for observation.          Final Clinical Impression(s) / ED Diagnoses Final diagnoses:  Precordial chest pain  Atrial fibrillation, unspecified type [facility])    Rx / DC Orders ED Discharge Orders     None         Maxwell Marion, PA-C 01/02/23 1548    Bethann Berkshire, MD 01/03/23 [phone]

## 2023-01-02 NOTE — ED Notes (Signed)
ED TO INPATIENT HANDOFF REPORT  Name/Age/Gender Jon [name] 67 y.o. male  Code Status    Code Status Orders  (From admission, onward)           Start     Ordered   01/02/23 1652  Full code  Continuous       Question:  By:  Answer:  Consent: discussion documented in EHR   01/02/23 1651           Code Status History     Date Active Date Inactive Code Status Order ID Comments User Context   03/04/2021 2144 03/07/2021 1923 Full Code [id number]  Hillary Bow, DO ED   04/17/2019 2100 04/24/2019 1806 Full Code [id number]  Katha Cabal, DO Inpatient   12/15/2016 2258 12/16/2016 1655 Full Code [id number]  Hyacinth Meeker, MD ED   04/19/2016 1030 04/19/2016 1411 Full Code [id number]  Fransisco Hertz, MD Inpatient   08/18/2015 1117 08/18/2015 1512 Full Code [id number]  Fransisco Hertz, MD Inpatient   05/05/2015 1158 05/06/2015 1751 Full Code [id number]  Rinaldo Cloud, MD Inpatient   05/02/2015 0034 05/05/2015 1158 Full Code [id number]  Eduard Clos, MD Inpatient   05/29/2013 1831 05/30/2013 2010 Full Code [id number]  Rinaldo Cloud, MD Inpatient       Home/SNF/Other Home  Chief Complaint NSTEMI (non-ST elevated myocardial infarction) (HCC) [I21.4]  Level of Care/Admitting Diagnosis ED Disposition     ED Disposition  Admit   Condition  --   Comment  Hospital Area: [facility] [[id number]]  Level of Care: Stepdown [14]  Admit to SDU based on following criteria: Cardiac Instability:  Patients experiencing chest pain, unconfirmed MI and stable, arrhythmias and CHF requiring medical management and potentially compromising patient's stability  May admit patient to [facility] or [facility] if equivalent level of care is available:: Yes  Covid Evaluation: Asymptomatic - no recent exposure (last 10 days) testing not required  Diagnosis: NSTEMI (non-ST elevated myocardial infarction) [facility]) [[id number]]  Admitting Physician: Maryln Gottron [[id number]]  Attending Physician:  Olexa.Dam, MIR Jaxson.Roy [[id number]]  Certification:: I certify this patient will need inpatient services for at least 2 midnights  Estimated Length of Stay: 3          Medical History Past Medical History:  Diagnosis Date   Chronic kidney disease    Dialysis patient (HCC)    Dyspnea    GERD (gastroesophageal reflux disease)    History of blood transfusion    Hyperlipidemia    takes Atorvastatin daily   Hypertension    takes Amlodipine,Losartan,and Labetalol daily   Myocardial infarction [facility])    lived in [place] maybe 7 years ago   Small bowel obstruction (HCC)     Allergies No Known Allergies  IV Location/Drains/Wounds Patient Lines/Drains/Airways Status     Active Line/Drains/Airways     Name Placement date Placement time Site Days   Peripheral IV 01/02/23 20 G 1" Left Antecubital 01/02/23  1203  Antecubital  less than 1   Fistula / Graft Right Forearm Arteriovenous fistula 10/25/15  1014  Forearm  2626   Incision (Closed) 03/08/15 Arm Left 03/08/15  1437  -- 2857   Incision (Closed) 05/05/15 Groin Right 05/05/15  --  -- 2799   Incision (Closed) 06/02/15 Arm Left 06/02/15  1029  -- 2771   Incision (Closed) 10/25/15 Arm Right 10/25/15  1019  -- 2626   Incision (Closed) 01/06/19 Arm Left 01/06/19  1206  -- 1457   Incision (Closed) 08/07/21  Penis 08/07/21  1538  -- 513            Labs/Imaging Results for orders placed or performed during the hospital encounter of 01/02/23 (from the past 48 hour(s))  Magnesium     Status: None   Collection Time: 01/02/23 11:56 AM  Result Value Ref Range   Magnesium 1.8 1.7 - 2.4 mg/dL    Comment: Performed at [facility], [address], [place], [place] [address]  TSH     Status: None   Collection Time: 01/02/23 12:00 PM  Result Value Ref Range   TSH 3.148 0.350 - 4.500 uIU/mL    Comment: Performed by a 3rd Generation assay with a functional sensitivity of <=0.01 uIU/mL. Performed at [facility], [address] [name], [place] [address]   Troponin I (High Sensitivity)     Status: Abnormal   Collection Time: 01/02/23 12:04 PM  Result Value Ref Range   Troponin I (High Sensitivity) 124 (HH) <18 ng/L    Comment: CRITICAL RESULT CALLED TO, READ BACK BY AND VERIFIED WITH BARRIER,C. RN AT 1256 01/02/23 MULLINS,T (NOTE) Elevated high sensitivity troponin I (hsTnI) values and significant  changes across serial measurements may suggest ACS but many other  chronic and acute conditions are known to elevate hsTnI results.  Refer to the "Links" section for chest pain algorithms and additional  guidance. Performed at [facility], [address], [place], [place] [address]   CBC     Status: Abnormal   Collection Time: 01/02/23 12:04 PM  Result Value Ref Range   WBC 6.0 4.0 - 10.5 K/uL   RBC 4.76 4.22 - 5.81 MIL/uL   Hemoglobin 13.1 13.0 - 17.0 g/dL   HCT 13.0 86.5 - 78.4 %   MCV 88.4 80.0 - 100.0 fL   MCH 27.5 26.0 - 34.0 pg   MCHC 31.1 30.0 - 36.0 g/dL   RDW 69.6 29.5 - 28.4 %   Platelets 129 (L) 150 - 400 K/uL   nRBC 0.0 0.0 - 0.2 %    Comment: Performed at [facility], [address], [place], [place] [address]  Comprehensive metabolic panel     Status: Abnormal   Collection Time: 01/02/23 12:04 PM  Result Value Ref Range   Sodium 139 135 - 145 mmol/L   Potassium 3.4 (L) 3.5 - 5.1 mmol/L   Chloride 103 98 - 111 mmol/L   CO2 28 22 - 32 mmol/L   Glucose, Bld 154 (H) 70 - 99 mg/dL    Comment: Glucose reference range applies only to samples taken after fasting for at least 8 hours.   BUN 14 8 - 23 mg/dL   Creatinine, Ser 0.10 (H) 0.61 - 1.24 mg/dL   Calcium 9.3 8.9 - 27.2 mg/dL   Total Protein 6.8 6.5 - 8.1 g/dL   Albumin 4.0 3.5 - 5.0 g/dL   AST 26 15 - 41 U/L   ALT 15 0 - 44 U/L   Alkaline Phosphatase 66 38 - 126 U/L   Total Bilirubin 0.3 0.3 - 1.2 mg/dL   GFR, Estimated 50 (L) >60 mL/min    Comment: (NOTE) Calculated  using the CKD-EPI Creatinine Equation (2021)    Anion gap 8 5 - 15    Comment: Performed at [facility], [address], [place], [place] [address]  Troponin I (High Sensitivity)     Status: Abnormal   Collection Time: 01/02/23  1:56 PM  Result Value Ref Range   Troponin I (High Sensitivity) 120 (HH) <18 ng/L    Comment: CRITICAL VALUE NOTED. VALUE IS CONSISTENT WITH PREVIOUSLY REPORTED/CALLED VALUE (NOTE) Elevated high sensitivity troponin I (hsTnI) values and significant  changes across serial measurements may suggest ACS but many other  chronic and acute conditions are known to elevate hsTnI results.  Refer to the "Links" section for chest pain algorithms and additional  guidance. Performed at [facility], [address] [place], [place] [address]   Brain natriuretic peptide     Status: Abnormal   Collection Time: 01/02/23  1:56 PM  Result Value Ref Range   B Natriuretic Peptide 543.6 (H) 0.0 - 100.0 pg/mL    Comment: Performed at [facility], [address], [place], [place] [address]  D-dimer, quantitative     Status: None   Collection Time: 01/02/23  1:56 PM  Result Value Ref Range   D-Dimer, Quant 0.27 0.00 - 0.50 ug/mL-FEU    Comment: (NOTE) At the manufacturer cut-off value of 0.5 g/mL FEU, this assay has a negative predictive value of 95-100%.This assay is intended for use in conjunction with a clinical pretest probability (PTP) assessment model to exclude pulmonary embolism (PE) and deep venous thrombosis (DVT) in outpatients suspected of PE or DVT. Results should be correlated with clinical presentation. Performed at [facility], [address] [place], [place] [address]    DG Chest 2 View  Result Date: 01/02/2023 CLINICAL DATA:  Shortness of breath EXAM: CHEST - 2 VIEW COMPARISON:  X-ray 07/15/2022 and older FINDINGS: Normal cardiopericardial silhouette. No pneumothorax, effusion or  edema. No consolidation. Overlapping cardiac leads. Films are under penetrated. IMPRESSION: No active cardiopulmonary disease. Electronically Signed   By: Karen Kays M.D.   On: 01/02/2023 12:43    Pending Labs Unresulted Labs (From admission, onward)     Start     Ordered   01/03/23 0500  Basic metabolic panel  Tomorrow morning,   R        01/02/23 1651   01/03/23 0500  CBC  Tomorrow morning,   R        01/02/23 1651   01/03/23 0000  Heparin level (unfractionated)  Once-Timed,   TIMED        01/02/23 1656   01/02/23 1651  HIV Antibody (routine testing w rflx)  (HIV Antibody (Routine testing w reflex) panel)  Once,   R        01/02/23 1651   01/02/23 1619  APTT  ONCE - STAT,   STAT        01/02/23 1618   01/02/23 1619  Protime-INR  ONCE - STAT,   STAT        01/02/23 1618            Vitals/Pain Today's Vitals   01/02/23 1121 01/02/23 1215 01/02/23 1520 01/02/23 1705  BP:  134/89 (!) 154/109   Pulse:  83 85   Resp: 20 19 20    Temp:   (!) 97.5 F (36.4 C)   TempSrc:   Oral   SpO2:  95% 97%   Weight:    103.4 kg  Height:    6' (1.829 m)  PainSc:        Isolation Precautions No active isolations  Medications Medications  aspirin EC tablet 81 mg (has no administration in time range)  metoprolol tartrate (LOPRESSOR) tablet 25 mg (has no administration in time range)  acetaminophen (TYLENOL) tablet  650 mg (has no administration in time range)    Or  acetaminophen (TYLENOL) suppository 650 mg (has no administration in time range)  morphine (PF) 2 MG/ML injection 2 mg (has no administration in time range)  ondansetron (ZOFRAN) tablet 4 mg (has no administration in time range)    Or  ondansetron (ZOFRAN) injection 4 mg (has no administration in time range)  heparin bolus via infusion 5,000 Units (has no administration in time range)  heparin ADULT infusion 100 units/mL (25000 units/248mL) (has no administration in time range)  morphine (PF) 4 MG/ML injection 4 mg (4 mg  Intravenous Given 01/02/23 1522)  ondansetron (ZOFRAN) injection 4 mg (4 mg Intravenous Given 01/02/23 1522)  metoprolol tartrate (LOPRESSOR) injection 5 mg (5 mg Intravenous Given 01/02/23 1706)    Mobility walks with person assist

## 2023-01-02 NOTE — ED Triage Notes (Signed)
Pt with increasing shob and headache and chest tightness since last night. No fever/chills, n/v/d.  Pt has pain to right trunk and right arm which is states is residual nerve damage from shingles last year.  Unable to tolerate that side being touched.  Pt reports no chronic breathing issues.

## 2023-01-02 NOTE — ED Provider Notes (Signed)
3:37 PM Patient seen in conjunction with Dr. Estell Harpin and Bing Ree.   Patient with onset of chest pain and shortness of breath starting last evening.  He has elevated troponin to 124 but flat delta to 120.  BNP is elevated at 543.  CXR clear.  TSH and electrolytes are okay.    Plan is to obtain recommendations from cardiology and hospital admit for ongoing chest pain new onset, rate controlled atrial fibrillation.  BP (!) 154/109 (BP Location: Left Arm)   Pulse 85   Temp (!) 97.5 F (36.4 C) (Oral)   Resp 20   SpO2 97%     Renne Crigler, PA-C 01/02/23 1539    Bethann Berkshire, MD 01/03/23 1119

## 2023-01-02 NOTE — Progress Notes (Addendum)
ANTICOAGULATION CONSULT NOTE - Initial Consult  Pharmacy Consult for IV Heparin Indication: atrial fibrillation, NSTEMI  No Known Allergies  Patient Measurements:   Heparin Dosing Weight: TBW Last weight documented: 106.4 (11/05/2022)  Vital Signs: Temp: 97.5 F (36.4 C) (06/05 1520) Temp Source: Oral (06/05 1520) BP: 154/109 (06/05 1520) Pulse Rate: 85 (06/05 1520)  Labs: Recent Labs    01/02/23 1204 01/02/23 1356  HGB 13.1  --   HCT 42.1  --   PLT 129*  --   CREATININE 1.55*  --   TROPONINIHS 124* 120*    CrCl cannot be calculated (Unknown ideal weight.). Est CrCl 49 ml/min using SCr 1.55   Medical History: Past Medical History:  Diagnosis Date   Chronic kidney disease    Dialysis patient (HCC)    Dyspnea    GERD (gastroesophageal reflux disease)    History of blood transfusion    Hyperlipidemia    takes Atorvastatin daily   Hypertension    takes Amlodipine,Losartan,and Labetalol daily   Myocardial infarction [facility])    lived in [place] maybe 7 years ago   Small bowel obstruction (HCC)     Medications:  No prior to admission anticoagulation medications listed  Assessment: Pharmacy to dose IV heparin for this 67 yo male with dyspnea, chest pain, and new onset atrial fibrillation.  CHA2DS2-VASc score 3. CBC: hemoglobin WNL, platelets low at 129 Troponins elevated  Goal of Therapy:  Heparin level 0.3-0.7 units/ml Monitor platelets by anticoagulation protocol: Yes   Plan:  Give 5000 units IV heparin bolus x 1 Start heparin infusion at 1300 units/hr Check heparin level in 6 hours Monitor daily heparin level, CBC, signs/symptoms of bleeding   Thank you for allowing pharmacy to be a part of this patient's care.  Selinda Eon, PharmD, BCPS Clinical Pharmacist [place] Please utilize Amion for appropriate phone number to reach the unit pharmacist [facility]) 01/02/2023 4:31 PM

## 2023-01-02 NOTE — ED Notes (Signed)
Patient's brother and nephew came to EMT first window asking for patient's keys so that they could go to patient's home. Checked with patient to see if this was okay. Had patient call his brother to verify that this was okay. Patient gave permission for keys to be given to brother, Jon [name].

## 2023-01-03 ENCOUNTER — Inpatient Hospital Stay (HOSPITAL_COMMUNITY): Payer: 59

## 2023-01-03 ENCOUNTER — Encounter (HOSPITAL_COMMUNITY)
Admission: EM | Disposition: A | Discharge status: Home / Self Care | Payer: Self-pay | Source: Home / Self Care | Attending: Internal Medicine

## 2023-01-03 DIAGNOSIS — I480 Paroxysmal atrial fibrillation: Secondary | ICD-10-CM

## 2023-01-03 DIAGNOSIS — R7989 Other specified abnormal findings of blood chemistry: Secondary | ICD-10-CM

## 2023-01-03 DIAGNOSIS — I4891 Unspecified atrial fibrillation: Secondary | ICD-10-CM

## 2023-01-03 DIAGNOSIS — N1831 Chronic kidney disease, stage 3a: Secondary | ICD-10-CM

## 2023-01-03 DIAGNOSIS — Z940 Kidney transplant status: Secondary | ICD-10-CM | POA: Diagnosis not present

## 2023-01-03 DIAGNOSIS — I169 Hypertensive crisis, unspecified: Secondary | ICD-10-CM | POA: Diagnosis not present

## 2023-01-03 DIAGNOSIS — I214 Non-ST elevation (NSTEMI) myocardial infarction: Secondary | ICD-10-CM | POA: Diagnosis not present

## 2023-01-03 DIAGNOSIS — N179 Acute kidney failure, unspecified: Secondary | ICD-10-CM | POA: Insufficient documentation

## 2023-01-03 LAB — URINALYSIS, ROUTINE W REFLEX MICROSCOPIC
Bacteria, UA: NONE SEEN
Bilirubin Urine: NEGATIVE
Glucose, UA: NEGATIVE mg/dL
Hgb urine dipstick: NEGATIVE
Ketones, ur: NEGATIVE mg/dL
Nitrite: NEGATIVE
Protein, ur: NEGATIVE mg/dL
Specific Gravity, Urine: 1.011 (ref 1.005–1.030)
pH: 7 (ref 5.0–8.0)

## 2023-01-03 LAB — BASIC METABOLIC PANEL
Anion gap: 9 (ref 5–15)
BUN: 12 mg/dL (ref 8–23)
CO2: 23 mmol/L (ref 22–32)
Calcium: 9.5 mg/dL (ref 8.9–10.3)
Chloride: 107 mmol/L (ref 98–111)
Creatinine, Ser: 1.47 mg/dL — ABNORMAL HIGH (ref 0.61–1.24)
GFR, Estimated: 53 mL/min — ABNORMAL LOW (ref >60)
Glucose, Bld: 112 mg/dL — ABNORMAL HIGH (ref 70–99)
Potassium: 3.9 mmol/L (ref 3.5–5.1)
Sodium: 139 mmol/L (ref 135–145)

## 2023-01-03 LAB — ECHOCARDIOGRAM COMPLETE
AR max vel: 2.77 cm2
AV Area VTI: 2.71 cm2
AV Area mean vel: 2.86 cm2
AV Mean grad: 5.5 mmHg
AV Peak grad: 11.1 mmHg
Ao pk vel: 1.67 m/s
Area-P 1/2: 3.56 cm2
Calc EF: 57.2 %
Height: 72 in
MV VTI: 4.05 cm2
S' Lateral: 3.9 cm
Single Plane A2C EF: 58 %
Single Plane A4C EF: 56.2 %
Weight: 3648 oz

## 2023-01-03 LAB — PROTEIN / CREATININE RATIO, URINE
Creatinine, Urine: 149 mg/dL
Protein Creatinine Ratio: 0.14 mg/mg{Cre} (ref 0.00–0.15)
Total Protein, Urine: 21 mg/dL

## 2023-01-03 LAB — CBC
HCT: 46.1 % (ref 39.0–52.0)
Hemoglobin: 13.9 g/dL (ref 13.0–17.0)
MCH: 27 pg (ref 26.0–34.0)
MCHC: 30.2 g/dL (ref 30.0–36.0)
MCV: 89.5 fL (ref 80.0–100.0)
Platelets: 132 10*3/uL — ABNORMAL LOW (ref 150–400)
RBC: 5.15 MIL/uL (ref 4.22–5.81)
RDW: 13.7 % (ref 11.5–15.5)
WBC: 6.9 10*3/uL (ref 4.0–10.5)
nRBC: 0 % (ref 0.0–0.2)

## 2023-01-03 LAB — TROPONIN I (HIGH SENSITIVITY)
Troponin I (High Sensitivity): 109 ng/L (ref <18)
Troponin I (High Sensitivity): 108 ng/L (ref <18)
Troponin I (High Sensitivity): 68 ng/L — ABNORMAL HIGH (ref <18)

## 2023-01-03 LAB — HEPARIN LEVEL (UNFRACTIONATED)
Heparin Unfractionated: 0.46 IU/mL (ref 0.30–0.70)
Heparin Unfractionated: 0.43 IU/mL (ref 0.30–0.70)

## 2023-01-03 LAB — SEDIMENTATION RATE: Sed Rate: 4 mm/hr (ref 0–16)

## 2023-01-03 LAB — C-REACTIVE PROTEIN: CRP: <0.5 mg/dL (ref <1.0)

## 2023-01-03 SURGERY — LEFT HEART CATH AND CORONARY ANGIOGRAPHY
Anesthesia: LOCAL

## 2023-01-03 MED ORDER — POTASSIUM CHLORIDE CRYS ER 20 MEQ PO TBCR
40.0000 meq | EXTENDED_RELEASE_TABLET | Freq: Once | ORAL | Status: AC
  Administered 2023-01-03: 40 meq via ORAL
  Filled 2023-01-03: qty 2

## 2023-01-03 MED ORDER — TACROLIMUS 1 MG PO CAPS
3.0000 mg | ORAL_CAPSULE | Freq: Every day | ORAL | Status: DC
  Administered 2023-01-03 – 2023-01-06 (×4): 3 mg via ORAL
  Filled 2023-01-03 (×4): qty 3

## 2023-01-03 MED ORDER — HYDRALAZINE HCL 50 MG PO TABS: 50.0000 mg | ORAL_TABLET | Freq: Three times a day (TID) | ORAL | Status: DC

## 2023-01-03 MED ORDER — NITROGLYCERIN IN D5W 200-5 MCG/ML-% IV SOLN
0.0000 ug/min | INTRAVENOUS | Status: DC
  Administered 2023-01-03: 5 ug/min via INTRAVENOUS
  Filled 2023-01-03: qty 250

## 2023-01-03 MED ORDER — LABETALOL HCL 5 MG/ML IV SOLN
10.0000 mg | INTRAVENOUS | Status: DC | PRN
  Administered 2023-01-05: 10 mg via INTRAVENOUS
  Filled 2023-01-03: qty 4

## 2023-01-03 MED ORDER — PREDNISONE 5 MG PO TABS
5.0000 mg | ORAL_TABLET | Freq: Every day | ORAL | Status: DC
  Administered 2023-01-03 – 2023-01-06 (×4): 5 mg via ORAL
  Filled 2023-01-03 (×4): qty 1

## 2023-01-03 MED ORDER — TACROLIMUS 1 MG PO CAPS
2.0000 mg | ORAL_CAPSULE | Freq: Every day | ORAL | Status: DC
  Administered 2023-01-03 – 2023-01-05 (×3): 2 mg via ORAL
  Filled 2023-01-03 (×3): qty 2

## 2023-01-03 MED ORDER — OXCARBAZEPINE 300 MG/5ML PO SUSP
300.0000 mg | Freq: Two times a day (BID) | ORAL | Status: DC
  Administered 2023-01-03 – 2023-01-06 (×7): 300 mg via ORAL
  Filled 2023-01-03 (×8): qty 5

## 2023-01-03 MED ORDER — HYDRALAZINE HCL 50 MG PO TABS
50.0000 mg | ORAL_TABLET | Freq: Three times a day (TID) | ORAL | Status: DC
  Administered 2023-01-03 – 2023-01-04 (×3): 50 mg via ORAL
  Filled 2023-01-03 (×3): qty 1

## 2023-01-03 MED ORDER — MYCOPHENOLATE MOFETIL 200 MG/ML PO SUSR
1000.0000 mg | Freq: Two times a day (BID) | ORAL | Status: DC
  Administered 2023-01-03 – 2023-01-06 (×6): 1000 mg via ORAL
  Filled 2023-01-03 (×15): qty 5

## 2023-01-03 MED ORDER — MAGNESIUM SULFATE IN D5W 1-5 GM/100ML-% IV SOLN
1.0000 g | Freq: Once | INTRAVENOUS | Status: AC
  Administered 2023-01-03: 1 g via INTRAVENOUS
  Filled 2023-01-03: qty 100

## 2023-01-03 MED ORDER — HYDRALAZINE HCL 20 MG/ML IJ SOLN
10.0000 mg | INTRAMUSCULAR | Status: DC | PRN
  Filled 2023-01-03: qty 1

## 2023-01-03 MED ORDER — LATANOPROST 0.005 % OP SOLN
1.0000 [drp] | Freq: Every day | OPHTHALMIC | Status: DC
  Administered 2023-01-03 – 2023-01-05 (×3): 1 [drp] via OPHTHALMIC
  Filled 2023-01-03: qty 2.5

## 2023-01-03 MED ORDER — LABETALOL HCL 5 MG/ML IV SOLN
INTRAVENOUS | Status: AC
  Administered 2023-01-03: 10 mg via INTRAVENOUS
  Filled 2023-01-03: qty 4

## 2023-01-03 MED ORDER — CARVEDILOL 25 MG PO TABS
25.0000 mg | ORAL_TABLET | Freq: Two times a day (BID) | ORAL | Status: DC
  Administered 2023-01-03 – 2023-01-06 (×6): 25 mg via ORAL
  Filled 2023-01-03 (×4): qty 2
  Filled 2023-01-03: qty 1
  Filled 2023-01-03: qty 2

## 2023-01-03 MED ORDER — NITROGLYCERIN 2 % TD OINT
1.0000 [in_us] | TOPICAL_OINTMENT | Freq: Four times a day (QID) | TRANSDERMAL | Status: DC
  Administered 2023-01-03: 1 [in_us] via TOPICAL
  Filled 2023-01-03: qty 30

## 2023-01-03 MED ORDER — ASPIRIN 81 MG PO CHEW
81.0000 mg | CHEWABLE_TABLET | Freq: Every day | ORAL | Status: DC
  Administered 2023-01-04 – 2023-01-06 (×3): 81 mg via ORAL
  Filled 2023-01-03 (×3): qty 1

## 2023-01-03 MED ORDER — ATORVASTATIN CALCIUM 40 MG PO TABS
80.0000 mg | ORAL_TABLET | Freq: Every day | ORAL | Status: DC
  Administered 2023-01-03 – 2023-01-05 (×3): 80 mg via ORAL
  Filled 2023-01-03 (×3): qty 2

## 2023-01-03 NOTE — Assessment & Plan Note (Signed)
-   Hemoglobin normal range.  Baseline 13 to 14 g/dL

## 2023-01-03 NOTE — Consult Note (Signed)
[facility]  HISTORY AND PHYSICAL  Jon [name] is an 67 y.o. male.    Chief Complaint: CP, headache, SOB  HPI: Pt is a 8M with a PMH sig for ESRD s/p DDKtx 2018, HTN, HLD, and non-obstructive CAD who is now seen in consultation at the request Dr Frederick Peers for eval and recs re: management of IS in transplanted kidney.    Pt presented [facility] 01/02/23 for headache, CP, SOB.  Found to have elevated troponins which were flat and hypertensive urgency/ emergency.  EKG showed new-onset Afib.  Cr was 1.47, baseline.  Was started on hep gtt and given home antihypertensives (amlodipine, hydralazine, carvedilol).  Cardiology consulted, started on nitro gtt and ordered TTE (in process on my evaluation).  In this setting we are asked to see.    Pt reporting improved headache and CP.  Has significant persistent pain at site of previous shingles infection 08/2022.    In regards to his transplant, post-op course has been fairly uncomplicated without rejection.  Followed by Dr Marisue Humble in our clinic.  He had labs done last week which showed Cr of 1.5 and prograf level 4.8.  He does not have significant proteinuria.  Review of record indicates historically well-controlled BP without difficulty bringing it in range nor episodes of lability.  Has been on amlodipine, hydralazine, and carvedilol long-term.    PMH: Past Medical History:  Diagnosis Date   Chronic kidney disease    Dialysis patient (HCC)    Dyspnea    GERD (gastroesophageal reflux disease)    History of blood transfusion    Hyperlipidemia    takes Atorvastatin daily   Hypertension    takes Amlodipine,Losartan,and Labetalol daily   Myocardial infarction [facility])    lived in [place] maybe 7 years ago   Small bowel obstruction (HCC)    PSH: Past Surgical History:  Procedure Laterality Date   ARTERIOVENOUS GRAFT PLACEMENT     AV FISTULA PLACEMENT Right 10/25/2015   Procedure: ARTERIOVENOUS (AV) FISTULA CREATION RIGHT FOREARM;  Surgeon:  Chuck Hint, MD;  Location: [facility] OR;  Service: Vascular;  Laterality: Right;   BOTOX INJECTION N/A 08/07/2021   Procedure: cystoscopy BOTOX INJECTION;  Surgeon: Crista Elliot, MD;  Location: [facility] ORS;  Service: Urology;  Laterality: N/A;   CARDIAC CATHETERIZATION N/A 05/05/2015   Procedure: Left Heart Cath and Coronary Angiography;  Surgeon: Rinaldo Cloud, MD;  Location: [facility] INVASIVE CV LAB;  Service: Cardiovascular;  Laterality: N/A;   LIGATION OF ARTERIOVENOUS  FISTULA Left 01/06/2019   Procedure: EXCISION OF ANEURYSMAL  ARTERIOVENOUS  FISTULA LEFT ARM;  Surgeon: Chuck Hint, MD;  Location: [facility] OR;  Service: Vascular;  Laterality: Left;   PERIPHERAL VASCULAR CATHETERIZATION N/A 08/18/2015   Procedure: Fistulagram;  Surgeon: Fransisco Hertz, MD;  Location: [facility] INVASIVE CV LAB;  Service: Cardiovascular;  Laterality: N/A;   PERIPHERAL VASCULAR CATHETERIZATION Right 04/19/2016   Procedure: Fistulagram;  Surgeon: Fransisco Hertz, MD;  Location: [facility] INVASIVE CV LAB;  Service: Cardiovascular;  Laterality: Right;   RESECTION OF ARTERIOVENOUS FISTULA ANEURYSM Left 02/05/2013   Procedure: REPAIR OF ANEURYSM OF LEFT ARM ARTERIOVENOUS FISTULA ;  Surgeon: Nada Libman, MD;  Location: [facility] OR;  Service: Vascular;  Laterality: Left;   RESECTION OF ARTERIOVENOUS FISTULA ANEURYSM Left 03/08/2015   Procedure: REPAIR OF LEFT ARTERIOVENOUS FISTULA PSEUDOANEURYSM;  Surgeon: Chuck Hint, MD;  Location: [facility] OR;  Service: Vascular;  Laterality: Left;   REVISON OF ARTERIOVENOUS FISTULA Left 06/02/2015   Procedure:  PLICATION OF A LARGE ANEURYSM LEFT UPPER ARM BRACHIO-CEPHALIC ARTERIOVENOUS FISTULA ;  Surgeon: Chuck Hint, MD;  Location: [facility] OR;  Service: Vascular;  Laterality: Left;   SMALL INTESTINE SURGERY      Past Medical History:  Diagnosis Date   Chronic kidney disease    Dialysis patient (HCC)    Dyspnea    GERD (gastroesophageal reflux disease)    History of blood transfusion     Hyperlipidemia    takes Atorvastatin daily   Hypertension    takes Amlodipine,Losartan,and Labetalol daily   Myocardial infarction [facility])    lived in [place] maybe 7 years ago   Small bowel obstruction (HCC)     Medications:  Scheduled:  amLODipine  10 mg Oral Daily   [START ON 01/04/2023] aspirin  81 mg Oral Daily   atorvastatin  80 mg Oral q1800   carvedilol  25 mg Oral BID WC   Chlorhexidine Gluconate Cloth  6 each Topical Daily   hydrALAZINE  50 mg Oral Q8H   latanoprost  1 drop Both Eyes QHS   mycophenolate  1,000 mg Oral BID   OXcarbazepine  300 mg Oral BID   predniSONE  5 mg Oral Q breakfast   tacrolimus  2 mg Oral QHS   tacrolimus  3 mg Oral Daily    Medications Prior to Admission  Medication Sig Dispense Refill   albuterol (PROVENTIL HFA;VENTOLIN HFA) 108 (90 Base) MCG/ACT inhaler Inhale 2 puffs into the lungs every 6 (six) hours as needed for wheezing or shortness of breath. 1 Inhaler 2   amLODipine (NORVASC) 10 MG tablet Take 10 mg by mouth at bedtime.     aspirin EC 81 MG EC tablet Take 1 tablet (81 mg total) by mouth daily. 30 tablet 3   atorvastatin (LIPITOR) 20 MG tablet Take 20 mg by mouth at bedtime.     brimonidine-timolol (COMBIGAN) 0.2-0.5 % ophthalmic solution Place 1 drop into both eyes every 12 (twelve) hours.     carvedilol (COREG) 25 MG tablet Take 1 tablet (25 mg total) by mouth 2 (two) times daily with a meal. 60 tablet 0   Diclofenac Sodium 1 % CREA 1gram qid prn (Patient taking differently: Apply 1 g topically 4 (four) times daily as needed (for pain- to affected sites).) 120 g 11   hydrALAZINE (APRESOLINE) 50 MG tablet Take 1 tablet (50 mg total) by mouth 3 (three) times daily.     lidocaine-prilocaine (EMLA) cream Apply 1 Application topically as needed. 1 gram qid prn (Patient taking differently: Apply 1 g topically 4 (four) times daily as needed (for pain).) 30 g 11   magnesium oxide (MAG-OX) 400 MG tablet Take 800 mg by mouth See admin instructions.  Take 800 mg by mouth two times a day- 2 hours before or after Cellcept     mycophenolate (CELLCEPT) 200 MG/ML suspension Take 1,000 mg by mouth 2 (two) times a day.     OXcarbazepine (TRILEPTAL) 300 MG/5ML suspension Take 5 mLs (300 mg total) by mouth 2 (two) times daily. 250 mL 12   PHOS-NAK 280-160-250 MG PACK Take 2 Packages by mouth 3 (three) times daily.     predniSONE 5 MG/5ML solution Take 5 mg by mouth daily.     sulfamethoxazole-trimethoprim (BACTRIM) 200-40 MG/5ML suspension Take 10 mLs by mouth every Monday, Wednesday, and Friday.     tacrolimus (PROGRAF) 1 MG capsule Take 2-3 mg by mouth See admin instructions. Take 3 mg by mouth in the morning and  2 mg at bedtime     VYZULTA 0.024 % SOLN Place 1 drop into both eyes at bedtime.      ALLERGIES:  No Known Allergies  FAM HX: Family History  Problem Relation Age of Onset   Hypertension Mother    Other Mother        varicose veins   Aneurysm Mother    Diabetes Father    Hypertension Father    Heart attack Father    Hypertension Sister    Other Sister        varicose veins   Other Brother        varicose veins    Social History:   reports that he has never smoked. He has never used smokeless tobacco. He reports that he does not drink alcohol and does not use drugs.  ROS: ROS: all other systems reviewed and are negative except as per HPI  Blood pressure (!) 153/91, pulse 62, temperature 97.8 F (36.6 C), temperature source Oral, resp. rate 15, height 6' (1.829 m), weight 103.4 kg, SpO2 94 %. PHYSICAL EXAM: Physical Exam GEN NAD, lying flat in bed on no O2 HEENT EOMI PERRL NECK no JVD PULM clear CV irregular, getting TTE  ABD soft EXT no LE edema NEURO Aao x 3 SKIN no rashes ACCESS: R RC AVF + T/B  Results for orders placed or performed during the hospital encounter of 01/02/23 (from the past 48 hour(s))  Magnesium     Status: None   Collection Time: 01/02/23 11:56 AM  Result Value Ref Range   Magnesium 1.8  1.7 - 2.4 mg/dL    Comment: Performed at [facility], [address], [place], [place] [address]  TSH     Status: None   Collection Time: 01/02/23 12:00 PM  Result Value Ref Range   TSH 3.148 0.350 - 4.500 uIU/mL    Comment: Performed by a 3rd Generation assay with a functional sensitivity of <=0.01 uIU/mL. Performed at [facility], [address], [place], [place] [address]   Troponin I (High Sensitivity)     Status: Abnormal   Collection Time: 01/02/23 12:04 PM  Result Value Ref Range   Troponin I (High Sensitivity) 124 (HH) <18 ng/L    Comment: CRITICAL RESULT CALLED TO, READ BACK BY AND VERIFIED WITH BARRIER,C. RN AT 1256 01/02/23 MULLINS,T (NOTE) Elevated high sensitivity troponin I (hsTnI) values and significant  changes across serial measurements may suggest ACS but many other  chronic and acute conditions are known to elevate hsTnI results.  Refer to the "Links" section for chest pain algorithms and additional  guidance. Performed at [facility], [address], [place], [place] [address]   CBC     Status: Abnormal   Collection Time: 01/02/23 12:04 PM  Result Value Ref Range   WBC 6.0 4.0 - 10.5 K/uL   RBC 4.76 4.22 - 5.81 MIL/uL   Hemoglobin 13.1 13.0 - 17.0 g/dL   HCT 91.4 78.2 - 95.6 %   MCV 88.4 80.0 - 100.0 fL   MCH 27.5 26.0 - 34.0 pg   MCHC 31.1 30.0 - 36.0 g/dL   RDW 21.3 08.6 - 57.8 %   Platelets 129 (L) 150 - 400 K/uL   nRBC 0.0 0.0 - 0.2 %    Comment: Performed at [facility], [address] [place], [place] [address]  Comprehensive metabolic panel     Status: Abnormal   Collection Time: 01/02/23 12:04  PM  Result Value Ref Range   Sodium 139 135 - 145 mmol/L   Potassium 3.4 (L) 3.5 - 5.1 mmol/L   Chloride 103 98 - 111 mmol/L   CO2 28 22 - 32 mmol/L   Glucose, Bld 154 (H) 70 - 99 mg/dL    Comment: Glucose reference range applies only to samples taken after fasting for at  least 8 hours.   BUN 14 8 - 23 mg/dL   Creatinine, Ser 5.62 (H) 0.61 - 1.24 mg/dL   Calcium 9.3 8.9 - 13.0 mg/dL   Total Protein 6.8 6.5 - 8.1 g/dL   Albumin 4.0 3.5 - 5.0 g/dL   AST 26 15 - 41 U/L   ALT 15 0 - 44 U/L   Alkaline Phosphatase 66 38 - 126 U/L   Total Bilirubin 0.3 0.3 - 1.2 mg/dL   GFR, Estimated 50 (L) >60 mL/min    Comment: (NOTE) Calculated using the CKD-EPI Creatinine Equation (2021)    Anion gap 8 5 - 15    Comment: Performed at [facility], [address], [place], [place] [address]  Troponin I (High Sensitivity)     Status: Abnormal   Collection Time: 01/02/23  1:56 PM  Result Value Ref Range   Troponin I (High Sensitivity) 120 (HH) <18 ng/L    Comment: CRITICAL VALUE NOTED. VALUE IS CONSISTENT WITH PREVIOUSLY REPORTED/CALLED VALUE (NOTE) Elevated high sensitivity troponin I (hsTnI) values and significant  changes across serial measurements may suggest ACS but many other  chronic and acute conditions are known to elevate hsTnI results.  Refer to the "Links" section for chest pain algorithms and additional  guidance. Performed at [facility], [address] [place], [place] [address]   Brain natriuretic peptide     Status: Abnormal   Collection Time: 01/02/23  1:56 PM  Result Value Ref Range   B Natriuretic Peptide 543.6 (H) 0.0 - 100.0 pg/mL    Comment: Performed at [facility], [address], [place], [place] [address]  D-dimer, quantitative     Status: None   Collection Time: 01/02/23  1:56 PM  Result Value Ref Range   D-Dimer, Quant 0.27 0.00 - 0.50 ug/mL-FEU    Comment: (NOTE) At the manufacturer cut-off value of 0.5 g/mL FEU, this assay has a negative predictive value of 95-100%.This assay is intended for use in conjunction with a clinical pretest probability (PTP) assessment model to exclude pulmonary embolism (PE) and deep venous thrombosis (DVT) in outpatients suspected of PE or  DVT. Results should be correlated with clinical presentation. Performed at [facility], [address] [place], [place] [address]   APTT     Status: Abnormal   Collection Time: 01/02/23  5:12 PM  Result Value Ref Range   aPTT 37 (H) 24 - 36 seconds    Comment:        IF BASELINE aPTT IS ELEVATED, SUGGEST PATIENT RISK ASSESSMENT BE USED TO DETERMINE APPROPRIATE ANTICOAGULANT THERAPY. Performed at [facility], [address] [place], [place] [address]   Protime-INR     Status: None   Collection Time: 01/02/23  5:12 PM  Result Value Ref Range   Prothrombin Time 14.9 11.4 - 15.2 seconds   INR 1.2 0.8 - 1.2    Comment: (NOTE) INR goal varies based on device and disease states. Performed at [facility], [address] [place], [place] [address]   HIV Antibody (routine testing  w rflx)     Status: None   Collection Time: 01/02/23  5:12 PM  Result Value Ref Range   HIV Screen 4th Generation wRfx Non Reactive Non Reactive    Comment: Performed at [facility], [address], [place], [place] [address]  Troponin I (High Sensitivity)     Status: Abnormal   Collection Time: 01/02/23  5:12 PM  Result Value Ref Range   Troponin I (High Sensitivity) 106 (HH) <18 ng/L    Comment: CRITICAL VALUE NOTED. VALUE IS CONSISTENT WITH PREVIOUSLY REPORTED/CALLED VALUE (NOTE) Elevated high sensitivity troponin I (hsTnI) values and significant  changes across serial measurements may suggest ACS but many other  chronic and acute conditions are known to elevate hsTnI results.  Refer to the "Links" section for chest pain algorithms and additional  guidance. Performed at [facility], [address], [place], [place] [address]   MRSA Next Gen by PCR, Nasal     Status: None   Collection Time: 01/02/23  6:14 PM   Specimen: Nasal Mucosa; Nasal Swab  Result Value Ref Range   MRSA by PCR Next Gen NOT DETECTED NOT  DETECTED    Comment: (NOTE) The GeneXpert MRSA Assay (FDA approved for NASAL specimens only), is one component of a comprehensive MRSA colonization surveillance program. It is not intended to diagnose MRSA infection nor to guide or monitor treatment for MRSA infections. Test performance is not FDA approved in patients less than 87 years old. Performed at [facility], [address] [place], [place] [address]   Troponin I (High Sensitivity)     Status: Abnormal   Collection Time: 01/02/23  6:43 PM  Result Value Ref Range   Troponin I (High Sensitivity) 103 (HH) <18 ng/L    Comment: CRITICAL VALUE NOTED. VALUE IS CONSISTENT WITH PREVIOUSLY REPORTED/CALLED VALUE (NOTE) Elevated high sensitivity troponin I (hsTnI) values and significant  changes across serial measurements may suggest ACS but many other  chronic and acute conditions are known to elevate hsTnI results.  Refer to the "Links" section for chest pain algorithms and additional  guidance. Performed at [facility], [address], [place], [place] [address]   Heparin level (unfractionated)     Status: None   Collection Time: 01/03/23 12:41 AM  Result Value Ref Range   Heparin Unfractionated 0.46 0.30 - 0.70 IU/mL    Comment: (NOTE) The clinical reportable range upper limit is being lowered to >1.10 to align with the FDA approved guidance for the current laboratory assay.  If heparin results are below expected values, and patient dosage has  been confirmed, suggest follow up testing of antithrombin III levels. Performed at [facility], [address], [place], [place] [address]   Basic metabolic panel     Status: Abnormal   Collection Time: 01/03/23 12:41 AM  Result Value Ref Range   Sodium 139 135 - 145 mmol/L   Potassium 3.9 3.5 - 5.1 mmol/L   Chloride 107 98 - 111 mmol/L   CO2 23 22 - 32 mmol/L   Glucose, Bld 112 (H) 70 - 99 mg/dL    Comment: Glucose  reference range applies only to samples taken after fasting for at least 8 hours.   BUN 12 8 - 23 mg/dL   Creatinine, Ser 1.30 (H) 0.61 - 1.24 mg/dL   Calcium 9.5 8.9 - 86.5 mg/dL   GFR, Estimated 53 (L) >60 mL/min    Comment: (NOTE) Calculated using  the CKD-EPI Creatinine Equation (2021)    Anion gap 9 5 - 15    Comment: Performed at [facility], [address], [place], [place] [address]  CBC     Status: Abnormal   Collection Time: 01/03/23 12:41 AM  Result Value Ref Range   WBC 6.9 4.0 - 10.5 K/uL   RBC 5.15 4.22 - 5.81 MIL/uL   Hemoglobin 13.9 13.0 - 17.0 g/dL   HCT 19.1 47.8 - 29.5 %   MCV 89.5 80.0 - 100.0 fL   MCH 27.0 26.0 - 34.0 pg   MCHC 30.2 30.0 - 36.0 g/dL   RDW 62.1 30.8 - 65.7 %   Platelets 132 (L) 150 - 400 K/uL   nRBC 0.0 0.0 - 0.2 %    Comment: Performed at [facility], [address] [place], [place] [address]  Troponin I (High Sensitivity)     Status: Abnormal   Collection Time: 01/03/23  6:31 AM  Result Value Ref Range   Troponin I (High Sensitivity) 109 (HH) <18 ng/L    Comment: CRITICAL VALUE NOTED. VALUE IS CONSISTENT WITH PREVIOUSLY REPORTED/CALLED VALUE (NOTE) Elevated high sensitivity troponin I (hsTnI) values and significant  changes across serial measurements may suggest ACS but many other  chronic and acute conditions are known to elevate hsTnI results.  Refer to the "Links" section for chest pain algorithms and additional  guidance. Performed at [facility], [address], [place], [place] [address]   Sedimentation rate     Status: None   Collection Time: 01/03/23  6:31 AM  Result Value Ref Range   Sed Rate 4 0 - 16 mm/hr    Comment: Performed at [facility], [address], [place], [place] [address]  Heparin level (unfractionated)     Status: None   Collection Time: 01/03/23  6:41 AM  Result Value Ref Range   Heparin Unfractionated 0.43 0.30 - 0.70 IU/mL     Comment: (NOTE) The clinical reportable range upper limit is being lowered to >1.10 to align with the FDA approved guidance for the current laboratory assay.  If heparin results are below expected values, and patient dosage has  been confirmed, suggest follow up testing of antithrombin III levels. Performed at [facility], [address], [place], [place] [address]   Troponin I (High Sensitivity)     Status: Abnormal   Collection Time: 01/03/23 12:24 PM  Result Value Ref Range   Troponin I (High Sensitivity) 108 (HH) <18 ng/L    Comment: CRITICAL VALUE NOTED. VALUE IS CONSISTENT WITH PREVIOUSLY REPORTED/CALLED VALUE (NOTE) Elevated high sensitivity troponin I (hsTnI) values and significant  changes across serial measurements may suggest ACS but many other  chronic and acute conditions are known to elevate hsTnI results.  Refer to the "Links" section for chest pain algorithms and additional  guidance. Performed at [facility], [address], [place], [place] [address]     DG Chest 2 View  Result Date: 01/02/2023 CLINICAL DATA:  Shortness of breath EXAM: CHEST - 2 VIEW COMPARISON:  X-ray 07/15/2022 and older FINDINGS: Normal cardiopericardial silhouette. No pneumothorax, effusion or edema. No consolidation. Overlapping cardiac leads. Films are under penetrated. IMPRESSION: No active cardiopulmonary disease. Electronically Signed   By: Karen Kays M.D.   On: 01/02/2023 12:43    Assessment/Plan CKD 3aT: Ddktx 2018 and has been maintained on pred, mycophenolate, and prograf long-term.    - pt at baseline  Cr  - will check renal transplant [place] and UP/C  - has had long-term maintenance with his current IS without significant side effects and issues; SE of IS as the primary driver of his    Hypertensive urgency 5 years out from transplant would be less likely in the setting of therapeutic tac levels (goal level 5-7).    - would maintain current IS  for now--> alternatives to prograf (sirolimus, everolimus, cyclosporine, belatacept) also have fairly significant SE so if we can control HTN in the outpatient setting a switch to a potentially less well-tolerated med may not be necessary  2.  Hypertensive urgency/ emergency/ NSTEMI  - cardiology following  - TTE in process  - on hep and nitro gtt  - also on home anti-hypertensives  3.  New onset Afib  - hep gtt  4.  Dispo: admitted  Bufford Buttner 01/03/2023, 2:59 PM

## 2023-01-03 NOTE — Assessment & Plan Note (Addendum)
-   Continue mycophenolate, prednisone, tacrolimus - Appreciate nephrology evaluation.  Not felt to have blood pressure changes related to tacrolimus - Blood pressure regimen has been modified, see separate problem

## 2023-01-03 NOTE — Assessment & Plan Note (Addendum)
-   Uncontrolled on admission - Home regimen includes amlodipine, Coreg, hydralazine - regimen has been modified; now off NTG drip - regimen to be modified as needed; currently nephrology and cardiology managing

## 2023-01-03 NOTE — Progress Notes (Signed)
ANTICOAGULATION CONSULT NOTE - Follow Up Consult  Pharmacy Consult for Heparin Indication: atrial fibrillation and NSTEMI  No Known Allergies  Patient Measurements: Height: 6' (182.9 cm) Weight: 103.4 kg (228 lb) IBW/kg (Calculated) : 77.6 Heparin Dosing Weight: 99 kg  Vital Signs: Temp: 98 F (36.7 C) (06/06 0400) Temp Source: Oral (06/06 0400) BP: 170/111 (06/06 0800) Pulse Rate: 72 (06/06 0800)  Labs: Recent Labs    01/02/23 1204 01/02/23 1356 01/02/23 1712 01/02/23 1843 01/03/23 0041  HGB 13.1  --   --   --  13.9  HCT 42.1  --   --   --  46.1  PLT 129*  --   --   --  132*  APTT  --   --  37*  --   --   LABPROT  --   --  14.9  --   --   INR  --   --  1.2  --   --   HEPARINUNFRC  --   --   --   --  0.46  CREATININE 1.55*  --   --   --  1.47*  TROPONINIHS 124* 120* 106* 103*  --     Estimated Creatinine Clearance: 63.1 mL/min (A) (by C-G formula based on SCr of 1.47 mg/dL (H)).   Medications:  Infusions:   heparin 1,300 Units/hr (01/03/23 0700)    Assessment: 66 yoM admitted on 6/5 with NSTEMI and new onset afib.  Pharmacy is consulted to dose Heparin IV.  No noted prior to admission anticoagulation.    Today, 01/03/2023: Heparin level 0.43, remains therapeutic on heparin 1300 units/hr CBC:  Hgb WNL, Plt remains low/stable at 132 No bleeding or complications reported.  Troponin: 124, 120, 106, 103   Goal of Therapy:  Heparin level 0.3-0.7 units/ml Monitor platelets by anticoagulation protocol: Yes   Plan:  Continue heparin IV infusion at 1300 units/hr Daily heparin level and CBC   Lynann Beaver PharmD, BCPS [facility] main pharmacy [phone] 01/03/2023 8:09 AM

## 2023-01-03 NOTE — Progress Notes (Signed)
ANTICOAGULATION CONSULT NOTE - Follow-Up Consult  Pharmacy Consult for IV Heparin Indication: atrial fibrillation, NSTEMI  No Known Allergies  Patient Measurements: Height: 6' (182.9 cm) Weight: 103.4 kg (228 lb) IBW/kg (Calculated) : 77.6 Heparin Dosing Weight: TBW Last weight documented: 106.4 (11/05/2022)  Vital Signs: Temp: 98 F (36.7 C) (06/05 2300) Temp Source: Oral (06/05 2300) BP: 156/95 (06/06 0031) Pulse Rate: 64 (06/06 0031)  Labs: Recent Labs    01/02/23 1204 01/02/23 1356 01/02/23 1712 01/02/23 1843 01/03/23 0041  HGB 13.1  --   --   --  13.9  HCT 42.1  --   --   --  46.1  PLT 129*  --   --   --  132*  APTT  --   --  37*  --   --   LABPROT  --   --  14.9  --   --   INR  --   --  1.2  --   --   HEPARINUNFRC  --   --   --   --  0.46  CREATININE 1.55*  --   --   --  1.47*  TROPONINIHS 124* 120* 106* 103*  --      Estimated Creatinine Clearance: 63.1 mL/min (A) (by C-G formula based on SCr of 1.47 mg/dL (H)). Est CrCl 49 ml/min using SCr 1.55   Medical History: Past Medical History:  Diagnosis Date   Chronic kidney disease    Dialysis patient (HCC)    Dyspnea    GERD (gastroesophageal reflux disease)    History of blood transfusion    Hyperlipidemia    takes Atorvastatin daily   Hypertension    takes Amlodipine,Losartan,and Labetalol daily   Myocardial infarction [facility])    lived in [place] maybe 7 years ago   Small bowel obstruction (HCC)     Medications:  No prior to admission anticoagulation medications listed  Assessment: Pharmacy to dose IV heparin for this 67 yo male with dyspnea, chest pain, and new onset atrial fibrillation.  CHA2DS2-VASc score 3. CBC: hemoglobin WNL, platelets low at 129 Troponins elevated  01/03/23 Heparin level = 0.46 (therapeutic) with heparin gtt @ 1300 units/hr Hgb = 13.9, Pltc 132K No complications of therapy noted  Goal of Therapy:  Heparin level 0.3-0.7 units/ml Monitor platelets by anticoagulation  protocol: Yes   Plan:  Continue heparin infusion at 1300 units/hr Recheck heparin level in 6 hours to confirm therapeutic dose Monitor daily heparin level, CBC, signs/symptoms of bleeding   Thank you for allowing pharmacy to be a part of this patient's care.  Terrilee Files, PharmD 01/03/2023 1:26 AM

## 2023-01-03 NOTE — Progress Notes (Addendum)
Progress Note    Jon [name]   ZOX:[id number]  DOB: 1955/08/29  DOA: 01/02/2023     1 PCP: Loura Back, NP  Initial CC: CP  Hospital Course: Jon [name] is a 67 yo male with PMH CAD, kidney transplant 2018, CKD3a, HLD, HTN, GERD who presented with chest pain.  He was woken from sleep with the pain and presented for further evaluation to the ER.  Pain was described as burning in nature and located along his sternum.  Blood pressure was uncontrolled on workup and he required multiple agents for control.  Troponin was also elevated and trended.  EKG showed T wave inversions.  Cardiology was consulted and patient was placed on heparin drip.  Interval History:  Admitted overnight with chest pain.  Has been started on heparin drip and still having uncontrolled chest pain and elevated blood pressure this morning.  Assessment and Plan: * Elevated troponin - differential includes demand ischemia 2/2 uncontrolled HTN vs NSTEMI - cardiology following as well - continue heparin drip for now; follow up echo - nitro paste ordered this am for ongoing CP; has SL NTG too  Paroxysmal atrial fibrillation (HCC) - New finding on admission - Initiated on heparin drip due to elevated troponin; eventually will transition to DOAC when able - Continue on telemetry -TSH normal  Kidney transplant recipient - Continue mycophenolate, prednisone, tacrolimus - Follow-up nephrology evaluation regarding tacrolimus in setting of uncontrolled hypertension vs HTN regimen modification   HTN (hypertension) - Uncontrolled - Home regimen includes amlodipine, Coreg, hydralazine -Patient started on PRN antihypertensives as well.  Nephrology consulted for evaluation given use of tacrolimus and possible alternative in setting of uncontrolled hypertension  Thrombocytopenia (HCC) - baseline around 115k-120k  Anemia due to chronic kidney disease - Hemoglobin normal range.  Baseline 13 to 14 g/dL   Old records reviewed  in assessment of this patient  Antimicrobials:   DVT prophylaxis:  SCDs Start: 01/02/23 1651   Code Status:   Code Status: Full Code  Mobility Assessment (last 72 hours)     Mobility Assessment   No documentation.           Barriers to discharge:  Disposition Plan:  Home Status is: Inpt  Objective: Blood pressure (!) 153/91, pulse 62, temperature 97.8 F (36.6 C), temperature source Oral, resp. rate 15, height 6' (1.829 m), weight 103.4 kg, SpO2 94 %.  Examination:  Physical Exam Constitutional:      General: He is not in acute distress.    Appearance: Normal appearance.  HENT:     Head: Normocephalic and atraumatic.     Mouth/Throat:     Mouth: Mucous membranes are moist.  Eyes:     Extraocular Movements: Extraocular movements intact.  Cardiovascular:     Rate and Rhythm: Normal rate and regular rhythm.  Pulmonary:     Effort: Pulmonary effort is normal. No respiratory distress.     Breath sounds: Normal breath sounds. No wheezing.  Abdominal:     General: Bowel sounds are normal. There is no distension.     Palpations: Abdomen is soft.     Tenderness: There is no abdominal tenderness.  Musculoskeletal:        General: Normal range of motion.     Cervical back: Normal range of motion and neck supple.  Skin:    General: Skin is warm and dry.  Neurological:     General: No focal deficit present.     Mental Status: He is alert.  Psychiatric:  Mood and Affect: Mood normal.        Behavior: Behavior normal.      Consultants:  Cardiology Nephrology  Procedures:    Data Reviewed: Results for orders placed or performed during the hospital encounter of 01/02/23 (from the past 24 hour(s))  APTT     Status: Abnormal   Collection Time: 01/02/23  5:12 PM  Result Value Ref Range   aPTT 37 (H) 24 - 36 seconds  Protime-INR     Status: None   Collection Time: 01/02/23  5:12 PM  Result Value Ref Range   Prothrombin Time 14.9 11.4 - 15.2 seconds    INR 1.2 0.8 - 1.2  HIV Antibody (routine testing w rflx)     Status: None   Collection Time: 01/02/23  5:12 PM  Result Value Ref Range   HIV Screen 4th Generation wRfx Non Reactive Non Reactive  Troponin I (High Sensitivity)     Status: Abnormal   Collection Time: 01/02/23  5:12 PM  Result Value Ref Range   Troponin I (High Sensitivity) 106 (HH) <18 ng/L  MRSA Next Gen by PCR, Nasal     Status: None   Collection Time: 01/02/23  6:14 PM   Specimen: Nasal Mucosa; Nasal Swab  Result Value Ref Range   MRSA by PCR Next Gen NOT DETECTED NOT DETECTED  Troponin I (High Sensitivity)     Status: Abnormal   Collection Time: 01/02/23  6:43 PM  Result Value Ref Range   Troponin I (High Sensitivity) 103 (HH) <18 ng/L  Heparin level (unfractionated)     Status: None   Collection Time: 01/03/23 12:41 AM  Result Value Ref Range   Heparin Unfractionated 0.46 0.30 - 0.70 IU/mL  Basic metabolic panel     Status: Abnormal   Collection Time: 01/03/23 12:41 AM  Result Value Ref Range   Sodium 139 135 - 145 mmol/L   Potassium 3.9 3.5 - 5.1 mmol/L   Chloride 107 98 - 111 mmol/L   CO2 23 22 - 32 mmol/L   Glucose, Bld 112 (H) 70 - 99 mg/dL   BUN 12 8 - 23 mg/dL   Creatinine, Ser 9.52 (H) 0.61 - 1.24 mg/dL   Calcium 9.5 8.9 - 84.1 mg/dL   GFR, Estimated 53 (L) >60 mL/min   Anion gap 9 5 - 15  CBC     Status: Abnormal   Collection Time: 01/03/23 12:41 AM  Result Value Ref Range   WBC 6.9 4.0 - 10.5 K/uL   RBC 5.15 4.22 - 5.81 MIL/uL   Hemoglobin 13.9 13.0 - 17.0 g/dL   HCT 32.4 40.1 - 02.7 %   MCV 89.5 80.0 - 100.0 fL   MCH 27.0 26.0 - 34.0 pg   MCHC 30.2 30.0 - 36.0 g/dL   RDW 25.3 66.4 - 40.3 %   Platelets 132 (L) 150 - 400 K/uL   nRBC 0.0 0.0 - 0.2 %  Troponin I (High Sensitivity)     Status: Abnormal   Collection Time: 01/03/23  6:31 AM  Result Value Ref Range   Troponin I (High Sensitivity) 109 (HH) <18 ng/L  Sedimentation rate     Status: None   Collection Time: 01/03/23  6:31 AM   Result Value Ref Range   Sed Rate 4 0 - 16 mm/hr  Heparin level (unfractionated)     Status: None   Collection Time: 01/03/23  6:41 AM  Result Value Ref Range   Heparin Unfractionated 0.43 0.30 - 0.70  IU/mL  Troponin I (High Sensitivity)     Status: Abnormal   Collection Time: 01/03/23 12:24 PM  Result Value Ref Range   Troponin I (High Sensitivity) 108 (HH) <18 ng/L    I have reviewed pertinent nursing notes, vitals, labs, and images as necessary. I have ordered labwork to follow up on as indicated.  I have reviewed the last notes from staff over past 24 hours. I have discussed patient's care plan and test results with nursing staff, CM/SW, and other staff as appropriate.  Time spent: Greater than 50% of the 55 minute visit was spent in counseling/coordination of care for the patient as laid out in the A&P.   LOS: 1 day   Lewie Chamber, MD [facility] 01/03/2023, 2:25 PM

## 2023-01-03 NOTE — Consult Note (Addendum)
Cardiology Consultation   Patient ID: Jon [name] MRN: [id number]; DOB: 1955-08-14  Admit date: 01/02/2023 Date of Consult: 01/03/2023  PCP:  Loura Back, NP   [facility] Providers Cardiologist: Dr. Sharyn Lull  Patient Profile:   Jon [name] is a 67 y.o. male with a history of minimal non-obstructive CAD noted on cardiac catheterization in 02/2017, hypertension, hyperlipidemia, ESRD s/p renal transplant in 06/2017 and now CKD stage III, GERD, and shingles in 08/2021 with postherpetic neuropathy who is being seen 01/03/2023 for the evaluation of new onset atrial fibrillation and chest pain at the request of Dr. Posey Rea (Emergency Department).  History of Present Illness:   Jon [name] is a 67 year old male with the above history. Patient reports he had a MI 7 year ago while living in [place] but states he did not have any intervention at that time. Most recent cardiac catheterization in 02/2017 in anticipation of renal transplant showed only minimal disease with minimal luminary irregularities (<10%) of the LAD, LCX, and RCA. He ultimately underwent a deceased-donor kidney transplant in 06/2017. Last Echo in 07/2017 showed LVEF of >70% with severe LVH and prolonged relaxation but no significant valvular disease.   Patient presented to the ED yesterday with shortness of breath and chest tightness. Upon arrival to the ED, EKG showed atrial fibrillation, rate 82 bpm, with LVH and T wave inversions in lateral leads. Of note, T wave inversions were also seen on prior EKG in 02/2021. High-sensitivity troponin minimally elevated and flat at 124 >> 120 >> 106 >>103. BNP elevated at 543. D-dimer negative. Chest x-ray showed no acute findings. WBC 6.0, Hgb 13.1, Plts 129. Na 139, K 3.4, Glucose 154, BUN 14, Cr 1.44. LFTs normal. Magnesium 1.8. TSH normal. Patient was started on IV Heparin and given one dose of IV Metoprolol and converted back to sinus rhythm. He was admitted to the Internal Medicine service  and Cardiology was consulted for further evaluation.   Patient follows with Dr. Sharyn Lull but requested that we see him during this admission because Dr. Sharyn Lull does not perform cardiac catheterizations anymore. Patient reports he was in his usual state of health until the evening of 01/01/2023 when he developed acute shortness of breath and substernal non-radiating chest tightness/ pressure as well as a headache and weakness. He states it is a little worse with exertion but it is also reproducible with palpation of sternum. He states he has not had pain like this before. He did have shingles in 08/2021 and has postherpetic neuropathy of his right trunk - it is exquisitely tender to the touch of his right side and states it even hurts if you breathe on it. He describes some orthopnea but denies any PND. He reports some chronic lower extremity edema on the right that improves with elevation of his left. He denise any palpitations. He does report some lightheadedness if he walks to quickly but no syncope. No recent fevers or illnesses.  He started having worsening chest pain this morning. EKG showed no acute changes. Nitro patch was placed and then he was given IV Morphine. He states this has not helped much. However, he does not look like he is in any distress. He was resting comfortably and sleeping when I walked in the room.  Past Medical History:  Diagnosis Date   Chronic kidney disease    Dialysis patient (HCC)    Dyspnea    GERD (gastroesophageal reflux disease)    History of blood transfusion    Hyperlipidemia  takes Atorvastatin daily   Hypertension    takes Amlodipine,Losartan,and Labetalol daily   Myocardial infarction [facility])    lived in [place] maybe 7 years ago   Small bowel obstruction [facility])     Past Surgical History:  Procedure Laterality Date   ARTERIOVENOUS GRAFT PLACEMENT     AV FISTULA PLACEMENT Right 10/25/2015   Procedure: ARTERIOVENOUS (AV) FISTULA CREATION RIGHT FOREARM;   Surgeon: Chuck Hint, MD;  Location: [facility] OR;  Service: Vascular;  Laterality: Right;   BOTOX INJECTION N/A 08/07/2021   Procedure: cystoscopy BOTOX INJECTION;  Surgeon: Crista Elliot, MD;  Location: [facility] ORS;  Service: Urology;  Laterality: N/A;   CARDIAC CATHETERIZATION N/A 05/05/2015   Procedure: Left Heart Cath and Coronary Angiography;  Surgeon: Rinaldo Cloud, MD;  Location: [facility] INVASIVE CV LAB;  Service: Cardiovascular;  Laterality: N/A;   LIGATION OF ARTERIOVENOUS  FISTULA Left 01/06/2019   Procedure: EXCISION OF ANEURYSMAL  ARTERIOVENOUS  FISTULA LEFT ARM;  Surgeon: Chuck Hint, MD;  Location: [facility] OR;  Service: Vascular;  Laterality: Left;   PERIPHERAL VASCULAR CATHETERIZATION N/A 08/18/2015   Procedure: Fistulagram;  Surgeon: Fransisco Hertz, MD;  Location: [facility] INVASIVE CV LAB;  Service: Cardiovascular;  Laterality: N/A;   PERIPHERAL VASCULAR CATHETERIZATION Right 04/19/2016   Procedure: Fistulagram;  Surgeon: Fransisco Hertz, MD;  Location: [facility] INVASIVE CV LAB;  Service: Cardiovascular;  Laterality: Right;   RESECTION OF ARTERIOVENOUS FISTULA ANEURYSM Left 02/05/2013   Procedure: REPAIR OF ANEURYSM OF LEFT ARM ARTERIOVENOUS FISTULA ;  Surgeon: Nada Libman, MD;  Location: [facility] OR;  Service: Vascular;  Laterality: Left;   RESECTION OF ARTERIOVENOUS FISTULA ANEURYSM Left 03/08/2015   Procedure: REPAIR OF LEFT ARTERIOVENOUS FISTULA PSEUDOANEURYSM;  Surgeon: Chuck Hint, MD;  Location: [facility] OR;  Service: Vascular;  Laterality: Left;   REVISON OF ARTERIOVENOUS FISTULA Left 06/02/2015   Procedure: PLICATION OF A LARGE ANEURYSM LEFT UPPER ARM BRACHIO-CEPHALIC ARTERIOVENOUS FISTULA ;  Surgeon: Chuck Hint, MD;  Location: [facility] OR;  Service: Vascular;  Laterality: Left;   SMALL INTESTINE SURGERY       Home Medications:  Prior to Admission medications   Medication Sig Start Date End Date Taking? Authorizing Provider  albuterol (PROVENTIL HFA;VENTOLIN HFA) 108 (90 Base) MCG/ACT  inhaler Inhale 2 puffs into the lungs every 6 (six) hours as needed for wheezing or shortness of breath. 01/24/17  Yes John Giovanni, MD  amLODipine (NORVASC) 10 MG tablet Take 10 mg by mouth at bedtime.   Yes [provider]  aspirin EC 81 MG EC tablet Take 1 tablet (81 mg total) by mouth daily. 05/30/13  Yes Rinaldo Cloud, MD  atorvastatin (LIPITOR) 20 MG tablet Take 20 mg by mouth at bedtime.   Yes [provider]  brimonidine-timolol (COMBIGAN) 0.2-0.5 % ophthalmic solution Place 1 drop into both eyes every 12 (twelve) hours.   Yes [provider]  carvedilol (COREG) 25 MG tablet Take 1 tablet (25 mg total) by mouth 2 (two) times daily with a meal. 04/24/19  Yes Mullis, Kiersten P, DO  Diclofenac Sodium 1 % CREA 1gram qid prn Patient taking differently: Apply 1 g topically 4 (four) times daily as needed (for pain- to affected sites). 11/05/22  Yes Levert Feinstein, MD  hydrALAZINE (APRESOLINE) 50 MG tablet Take 1 tablet (50 mg total) by mouth 3 (three) times daily. 01/06/19  Yes Rhyne, Samantha J, PA-C  lidocaine-prilocaine (EMLA) cream Apply 1 Application topically as needed. 1 gram qid prn Patient taking  differently: Apply 1 g topically 4 (four) times daily as needed (for pain). 11/05/22  Yes Levert Feinstein, MD  magnesium oxide (MAG-OX) 400 MG tablet Take 800 mg by mouth See admin instructions. Take 800 mg by mouth two times a day- 2 hours before or after Cellcept   Yes [provider]  mycophenolate (CELLCEPT) 200 MG/ML suspension Take 1,000 mg by mouth 2 (two) times a day. 03/20/18  Yes [provider]  OXcarbazepine (TRILEPTAL) 300 MG/5ML suspension Take 5 mLs (300 mg total) by mouth 2 (two) times daily. 11/05/22  Yes Levert Feinstein, MD  PHOS-NAK 280-160-250 MG PACK Take 2 Packages by mouth 3 (three) times daily. 12/15/18  Yes [provider]  predniSONE 5 MG/5ML solution Take 5 mg by mouth daily. 11/18/18  Yes [provider]   sulfamethoxazole-trimethoprim (BACTRIM) 200-40 MG/5ML suspension Take 10 mLs by mouth every Monday, Wednesday, and Friday. 06/18/19  Yes [provider]  tacrolimus (PROGRAF) 1 MG capsule Take 2-3 mg by mouth See admin instructions. Take 3 mg by mouth in the morning and 2 mg at bedtime   Yes [provider]  VYZULTA 0.024 % SOLN Place 1 drop into both eyes at bedtime.   Yes [provider]  fluticasone (FLOVENT HFA) 44 MCG/ACT inhaler Inhale 1 puff into the lungs 2 (two) times daily. Patient not taking: No sig reported 03/08/17 03/04/21  Coralyn Helling, MD    Inpatient Medications: Scheduled Meds:  amLODipine  10 mg Oral Daily   aspirin EC  81 mg Oral Daily   Chlorhexidine Gluconate Cloth  6 each Topical Daily   latanoprost  1 drop Both Eyes QHS   metoprolol tartrate  25 mg Oral BID   mycophenolate  1,000 mg Oral BID   nitroGLYCERIN  1 inch Topical Q6H   nitroGLYCERIN       OXcarbazepine  300 mg Oral BID   predniSONE  5 mg Oral Q breakfast   tacrolimus  2 mg Oral QHS   tacrolimus  3 mg Oral Daily   Continuous Infusions:  heparin 1,300 Units/hr (01/03/23 0845)   PRN Meds: acetaminophen **OR** acetaminophen, hydrALAZINE, labetalol, morphine injection, nitroGLYCERIN, nitroGLYCERIN, ondansetron **OR** ondansetron (ZOFRAN) IV, mouth rinse, pneumococcal 20-valent conjugate vaccine  Allergies:   No Known Allergies  Social History:   Social History   Socioeconomic History   Marital status: Married    Spouse name: Not on file   Number of children: 4   Years of education: Not on file   Highest education level: Not on file  Occupational History   Occupation: DISABILITY    Employer: UNEMPLOYED  Tobacco Use   Smoking status: Never   Smokeless tobacco: Never  Vaping Use   Vaping Use: Never used  Substance and Sexual Activity   Alcohol use: No    Alcohol/week: 0.0 standard drinks of alcohol   Drug use: No   Sexual activity: Not on file  Other Topics  Concern   Not on file  Social History Narrative   Not on file   Social Determinants of Health   Financial Resource Strain: Not on file  Food Insecurity: No Food Insecurity (01/02/2023)   Hunger Vital Sign    Worried About Running Out of Food in the Last Year: Never true    Ran Out of Food in the Last Year: Never true  Transportation Needs: No Transportation Needs (01/02/2023)   PRAPARE - Administrator, Civil Service (Medical): No    Lack of Transportation (  Non-Medical): No  Physical Activity: Not on file  Stress: Not on file  Social Connections: Not on file  Intimate Partner Violence: Not At Risk (01/02/2023)   Humiliation, Afraid, Rape, and Kick questionnaire    Fear of Current or Ex-Partner: No    Emotionally Abused: No    Physically Abused: No    Sexually Abused: No    Family History:   Family History  Problem Relation Age of Onset   Hypertension Mother    Other Mother        varicose veins   Aneurysm Mother    Diabetes Father    Hypertension Father    Heart attack Father    Hypertension Sister    Other Sister        varicose veins   Other Brother        varicose veins     ROS:  Please see the history of present illness.  All other ROS reviewed and negative.     Physical Exam/Data:   Vitals:   01/03/23 0828 01/03/23 0846 01/03/23 0900 01/03/23 1000  BP:  (!) 174/101 (!) 156/109 (!) 177/112  Pulse: 69 73 76 75  Resp: 14 19 12 17   Temp:      TempSrc:      SpO2: 96% 96% 95% 96%  Weight:      Height:        Intake/Output Summary (Last 24 hours) at 01/03/2023 1031 Last data filed at 01/03/2023 0728 Gross per 24 hour  Intake 887.43 ml  Output 2000 ml  Net -1112.57 ml      01/02/2023    5:05 PM 11/05/2022    8:23 AM 07/15/2022    6:39 PM  Last 3 Weights  Weight (lbs) 228 lb 234 lb 8 oz 236 lb  Weight (kg) 103.42 kg 106.369 kg 107.049 kg     Body mass index is 30.92 kg/m.  General: 67 y.o. male resting comfortably in no acute distress. HEENT:  Normocephalic and atraumatic. Sclera clear.  Neck: Supple. No carotid bruits. Slight distended external jugular vein. Heart: RRR. Distinct S1 and S2. No murmurs, gallops, or rubs. Radial pulses 2+ and equal bilaterally. Chest wall tender to palpation along sternum. Lungs: No increased work of breathing. Clear to ausculation bilaterally. No wheezes, rhonchi, or rales.  Abdomen: Soft, possibly mildly distended, and non-tender to palpation. Bowel sounds present. MSK: Normal strength and tone for age. Extremities: No lower extremity edema.    Skin: Warm and dry. Neuro: Alert and oriented x3. No focal deficits. Psych: Normal affect. Responds appropriately.  EKG:  The EKG was personally reviewed and demonstrates:  Initial EKG on 6/5 showed atrial fibrillation, rate 82 bpm, with LVH and T wave inversions in lateral leads (T wave inversions were also seen on prior EKG in 02/2021). Repeat EKG on 6/6 showed  normal sinus rhythm, rate 75bpm, with LVH and T wave inversions in lateral leads. Qtc was automatically read as but on my personal calculation using the Framinghan formula.  Telemetry:  Telemetry was personally reviewed and demonstrates:  Normal sinus rhythm with rates in the 60s to 70s. Frequent PVC and ventricular couplets as well as short runs of NSVT (longest run 15 beats).  Relevant CV Studies:  Cardiac Catheterization 03/05/2017 ([facility]): Findings: Left Anterior Descending: The vessel exhibits minimal luminal irregularities (<10%).  Left Circumflex: The vessel exhibits minimal luminal irregularities (<10%).  Right Coronary Artery: The vessel exhibits minimal luminal irregularities (<10%).  _______________  Echocardiogram 08/08/2017 ([facility]): Summary: The left ventricular size is normal.  Severe left ventricular hypertrophy  LV ejection fraction = >70%.  Left ventricular filling pattern is prolonged relaxation.  The right ventricle is normal  size and function.  Mild right ventrticular hypertrophy is present.  There is mild mitral annular calcification.  There is no significant valvular stenosis or regurgitation  IVC size was normal.  There is trivial pericardial effusion. Pericardial fat pad is noted.  There is no significant change in comparison with the last study.    Laboratory Data:  High Sensitivity Troponin:   Recent Labs  Lab 01/02/23 1204 01/02/23 1356 01/02/23 1712 01/02/23 1843 01/03/23 0631  TROPONINIHS 124* 120* 106* 103* 109*     Chemistry Recent Labs  Lab 01/02/23 1156 01/02/23 1204 01/03/23 0041  NA  --  139 139  K  --  3.4* 3.9  CL  --  103 107  CO2  --  28 23  GLUCOSE  --  154* 112*  BUN  --  14 12  CREATININE  --  1.55* 1.47*  CALCIUM  --  9.3 9.5  MG 1.8  --   --   GFRNONAA  --  50* 53*  ANIONGAP  --  8 9    Recent Labs  Lab 01/02/23 1204  PROT 6.8  ALBUMIN 4.0  AST 26  ALT 15  ALKPHOS 66  BILITOT 0.3   Lipids No results for input(s): "CHOL", "TRIG", "HDL", "LABVLDL", "LDLCALC", "CHOLHDL" in the last 168 hours.  Hematology Recent Labs  Lab 01/02/23 1204 01/03/23 0041  WBC 6.0 6.9  RBC 4.76 5.15  HGB 13.1 13.9  HCT 42.1 46.1  MCV 88.4 89.5  MCH 27.5 27.0  MCHC 31.1 30.2  RDW 13.6 13.7  PLT 129* 132*   Thyroid  Recent Labs  Lab 01/02/23 1200  TSH 3.148    BNP Recent Labs  Lab 01/02/23 1356  BNP 543.6*    DDimer  Recent Labs  Lab 01/02/23 1356  DDIMER 0.27     Radiology/Studies:  DG Chest 2 View  Result Date: 01/02/2023 CLINICAL DATA:  Shortness of breath EXAM: CHEST - 2 VIEW COMPARISON:  X-ray 07/15/2022 and older FINDINGS: Normal cardiopericardial silhouette. No pneumothorax, effusion or edema. No consolidation. Overlapping cardiac leads. Films are under penetrated. IMPRESSION: No active cardiopulmonary disease. Electronically Signed   By: Karen Kays M.D.   On: 01/02/2023 12:43     Assessment and Plan:   New Onset Atrial Fibrillation Patient  presented with shortness of breath and chest tightness and was found to be in new onset rate controlled atrial fibrillation. He was given a dose of IV Metoprolol and converted back to sinus rhythm.  - Maintaining sinus rhythm but having frequent PVCs. - Potassium 3.4 on arrival but supplemented and 3.9 today. - Magnesium 1.8 on arrival.  - TSH normal.  - Echo pending. - He is on Coreg 25mg  twice daily at home but has been placed on Lopressor 25mg  twice daily here. Will change him back to home Coreg given uncontrolled BP. - CHA2DS2-VASc = 2 (CAD, HTN). He has been started on IV Heparin. Can switch to Eliquis 5mg  twice daily if no procedures are needed.  Chest Pain Minimal CAD Elevated Troponin LHC in 02/2017 prior to renal transplants showed only minimal disease with minimal luminal irregularities of LAD, LCX, and RCA. He presented with chest pain. EKG shows no acute ischemic changes. High-sensitivity minimally elevated and flat 124 >> 120 >>  106 >>103 not consistent with ACS. - He continues to have chest pain that is reproducible with palpation of chest wall along sternum. - Echo pending. - Will check Sed Rate and CRP - Chest pain sounds very atypical. Possibly musculoskeletal in nature. However, he is having a lot of ventricular ectopy. Will wait for Echo results. If EF is down, may need LHC.   Shortness of Breath Patient presented with acute shortness of breath and also describes orthopnea and some abdominal bloating. BNP elevated at 543. Chest x-ray shows no acute findings. D-dimer was normal. - Abdomen looks slightly distended but may be underlying obesity. However, lung clear.  - Could consider one dose of IV Lasix to see how he responds.  PVCs NSVT Patient is having a lot of PVCs and ventricular couplets on telemetry. He also had a 15 beat run of NSVT.  - Potassium 3.9 today. Will give another dose of Kcl 40 mEq. Goal >4.0.  - Magnesium 1.8. Will given 1g of IV Mag Sulfate. Goal  >2.0. - Echo pending. - Currently on Lopressor but will go back to home dose of Coreg as above for additional BP control.  Hypertension BP labile. It was initially well controlled but then spiked to 210/122 last night. BP ranging from the 150s-190s/90s-110s this morning. However, he has not received any of his antihypertensives yet. - Continue Amlodipine 10mg  daily.  - Will stop Lopressor and switch back to Coreg 25mg  twice daily. - Restart home Hydralazine 50mg  three times daily.  Hyperlipidemia - Continue Lipitor 20mg  daily.  History of Renal Transplant in 06/2017 CKD Stage III History of ESRD s/p renal transplant in 06/2017. Now with CKD stage III. Baseline creatinine around 1.5.  - Stable this admission. - Continue to monitor.    Risk Assessment/Risk Scores:    CHA2DS2-VASc Score = 2  This indicates a 2.2% annual risk of stroke. The patient's score is based upon: CHF History: 0 HTN History: 1 Diabetes History: 0 Stroke History: 0 Vascular Disease History: 1 Age Score: 0 Gender Score: 0     For questions or updates, please contact [facility] Please consult [email or web address] for contact info under    Signed, Corrin [name], PA-C  01/03/2023 10:31 AM

## 2023-01-03 NOTE — Hospital Course (Addendum)
Mr. Quintela is a 67 yo male with PMH CAD, kidney transplant 2018, CKD3a, HLD, HTN, GERD who presented with chest pain.  He was woken from sleep with the pain and presented for further evaluation to the ER.  Pain was described as burning in nature and located along his sternum.  Blood pressure was uncontrolled on workup and he required multiple agents for control.  Troponin was also elevated and trended.  EKG showed T wave inversions.  Cardiology was consulted and patient was placed on heparin drip. Blood pressure was persistently elevated and he was ultimately started on a nitroglycerin drip.  Some of his chest pain was considered related to recent shingles with residual hypersensitivity/tenderness.  Etiology of his troponin leak and EKG changes was considered due to excessively elevated blood pressure.  He was not felt to warrant catheterization.

## 2023-01-03 NOTE — Assessment & Plan Note (Addendum)
-   differential includes demand ischemia 2/2 uncontrolled HTN vs NSTEMI - cardiology following as well; etiology believed to be due to uncontrolled HTN (now improved) - on heparin drip with plans to transition to DOAC for afib as noted - echo obtained and reviewed by cardiology as well -Troponins peaked at 124 and have since down trended

## 2023-01-03 NOTE — Assessment & Plan Note (Addendum)
-   New finding on admission - Initiated on heparin drip due to elevated troponin; transition to Eliquis on 6/8 - TSH normal

## 2023-01-03 NOTE — Assessment & Plan Note (Signed)
-   baseline around 115k-120k

## 2023-01-04 ENCOUNTER — Other Ambulatory Visit (HOSPITAL_COMMUNITY): Payer: Self-pay

## 2023-01-04 DIAGNOSIS — Z8619 Personal history of other infectious and parasitic diseases: Secondary | ICD-10-CM | POA: Insufficient documentation

## 2023-01-04 DIAGNOSIS — B029 Zoster without complications: Secondary | ICD-10-CM | POA: Insufficient documentation

## 2023-01-04 DIAGNOSIS — N179 Acute kidney failure, unspecified: Secondary | ICD-10-CM | POA: Diagnosis not present

## 2023-01-04 DIAGNOSIS — R7989 Other specified abnormal findings of blood chemistry: Secondary | ICD-10-CM | POA: Diagnosis not present

## 2023-01-04 DIAGNOSIS — I480 Paroxysmal atrial fibrillation: Secondary | ICD-10-CM | POA: Diagnosis not present

## 2023-01-04 DIAGNOSIS — I10 Essential (primary) hypertension: Secondary | ICD-10-CM | POA: Diagnosis not present

## 2023-01-04 DIAGNOSIS — Z940 Kidney transplant status: Secondary | ICD-10-CM | POA: Diagnosis not present

## 2023-01-04 LAB — BASIC METABOLIC PANEL
Anion gap: 8 (ref 5–15)
BUN: 22 mg/dL (ref 8–23)
CO2: 25 mmol/L (ref 22–32)
Calcium: 9 mg/dL (ref 8.9–10.3)
Chloride: 105 mmol/L (ref 98–111)
Creatinine, Ser: 1.9 mg/dL — ABNORMAL HIGH (ref 0.61–1.24)
GFR, Estimated: 39 mL/min — ABNORMAL LOW (ref >60)
Glucose, Bld: 144 mg/dL — ABNORMAL HIGH (ref 70–99)
Potassium: 3.6 mmol/L (ref 3.5–5.1)
Sodium: 138 mmol/L (ref 135–145)

## 2023-01-04 LAB — CBC
HCT: 43.8 % (ref 39.0–52.0)
Hemoglobin: 13.5 g/dL (ref 13.0–17.0)
MCH: 27.7 pg (ref 26.0–34.0)
MCHC: 30.8 g/dL (ref 30.0–36.0)
MCV: 89.8 fL (ref 80.0–100.0)
Platelets: 132 10*3/uL — ABNORMAL LOW (ref 150–400)
RBC: 4.88 MIL/uL (ref 4.22–5.81)
RDW: 13.8 % (ref 11.5–15.5)
WBC: 5.7 10*3/uL (ref 4.0–10.5)
nRBC: 0 % (ref 0.0–0.2)

## 2023-01-04 LAB — TROPONIN I (HIGH SENSITIVITY): Troponin I (High Sensitivity): 80 ng/L — ABNORMAL HIGH (ref <18)

## 2023-01-04 LAB — PHOSPHORUS: Phosphorus: 3.8 mg/dL (ref 2.5–4.6)

## 2023-01-04 LAB — LIPID PANEL
Cholesterol: 107 mg/dL (ref 0–200)
HDL: 31 mg/dL — ABNORMAL LOW (ref >40)
LDL Cholesterol: 31 mg/dL (ref 0–99)
Total CHOL/HDL Ratio: 3.5 RATIO
Triglycerides: 226 mg/dL — ABNORMAL HIGH (ref <150)
VLDL: 45 mg/dL — ABNORMAL HIGH (ref 0–40)

## 2023-01-04 LAB — HEPARIN LEVEL (UNFRACTIONATED): Heparin Unfractionated: 0.3 IU/mL (ref 0.30–0.70)

## 2023-01-04 LAB — MAGNESIUM: Magnesium: 1.8 mg/dL (ref 1.7–2.4)

## 2023-01-04 MED ORDER — HYDRALAZINE HCL 50 MG PO TABS
100.0000 mg | ORAL_TABLET | Freq: Three times a day (TID) | ORAL | Status: DC
  Administered 2023-01-04 – 2023-01-06 (×6): 100 mg via ORAL
  Filled 2023-01-04 (×6): qty 2

## 2023-01-04 MED ORDER — POTASSIUM CHLORIDE CRYS ER 20 MEQ PO TBCR
40.0000 meq | EXTENDED_RELEASE_TABLET | Freq: Once | ORAL | Status: AC
  Administered 2023-01-04: 40 meq via ORAL
  Filled 2023-01-04: qty 2

## 2023-01-04 MED ORDER — SENNOSIDES-DOCUSATE SODIUM 8.6-50 MG PO TABS
1.0000 | ORAL_TABLET | Freq: Two times a day (BID) | ORAL | Status: DC
  Administered 2023-01-04 – 2023-01-06 (×5): 1 via ORAL
  Filled 2023-01-04 (×5): qty 1

## 2023-01-04 MED ORDER — GABAPENTIN 100 MG PO CAPS
100.0000 mg | ORAL_CAPSULE | Freq: Three times a day (TID) | ORAL | Status: DC
  Administered 2023-01-04 – 2023-01-06 (×6): 100 mg via ORAL
  Filled 2023-01-04 (×6): qty 1

## 2023-01-04 MED ORDER — ISOSORBIDE MONONITRATE ER 60 MG PO TB24
60.0000 mg | ORAL_TABLET | Freq: Every day | ORAL | Status: DC
  Administered 2023-01-04 – 2023-01-06 (×3): 60 mg via ORAL
  Filled 2023-01-04 (×3): qty 1

## 2023-01-04 MED ORDER — POLYETHYLENE GLYCOL 3350 17 G PO PACK
17.0000 g | PACK | Freq: Every day | ORAL | Status: DC
  Administered 2023-01-04 – 2023-01-06 (×3): 17 g via ORAL
  Filled 2023-01-04 (×3): qty 1

## 2023-01-04 MED ORDER — MAGNESIUM SULFATE IN D5W 1-5 GM/100ML-% IV SOLN
1.0000 g | Freq: Once | INTRAVENOUS | Status: AC
  Administered 2023-01-04: 1 g via INTRAVENOUS
  Filled 2023-01-04: qty 100

## 2023-01-04 NOTE — Progress Notes (Signed)
ANTICOAGULATION CONSULT NOTE - Follow Up Consult  Pharmacy Consult for Heparin Indication: atrial fibrillation and NSTEMI  No Known Allergies  Patient Measurements: Height: 6' (182.9 cm) Weight: 103.4 kg (228 lb) IBW/kg (Calculated) : 77.6 Heparin Dosing Weight: 99 kg  Vital Signs: Temp: 97.9 F (36.6 C) (06/07 0400) Temp Source: Oral (06/07 0400) BP: 142/85 (06/07 0600) Pulse Rate: 66 (06/07 0600)  Labs: Recent Labs    01/02/23 1204 01/02/23 1356 01/02/23 1712 01/02/23 1843 01/03/23 0041 01/03/23 0631 01/03/23 0641 01/03/23 1224 01/03/23 1913 01/04/23 0014  HGB 13.1  --   --   --  13.9  --   --   --   --  13.5  HCT 42.1  --   --   --  46.1  --   --   --   --  43.8  PLT 129*  --   --   --  132*  --   --   --   --  132*  APTT  --   --  37*  --   --   --   --   --   --   --   LABPROT  --   --  14.9  --   --   --   --   --   --   --   INR  --   --  1.2  --   --   --   --   --   --   --   HEPARINUNFRC  --   --   --   --  0.46  --  0.43  --   --  0.30  CREATININE 1.55*  --   --   --  1.47*  --   --   --   --  1.90*  TROPONINIHS 124*   < > 106*   < >  --    < >  --  108* 68* 80*   < > = values in this interval not displayed.     Estimated Creatinine Clearance: 48.8 mL/min (A) (by C-G formula based on SCr of 1.9 mg/dL (H)).   Medications:  Infusions:   heparin 1,300 Units/hr (01/04/23 1610)   nitroGLYCERIN Stopped (01/03/23 1745)    Assessment: 75 yoM admitted on 6/5 with concern for NSTEMI and new onset afib. Pharmacy consulted to manage heparin. No noted prior to admission anticoagulation. Cardiology following - suspect noncardiac chest pain. Plan for eventual transition to oral anticoagulation once certain patient will not need any procedures.   Today, 01/04/2023: Heparin level 0.30, remains therapeutic on heparin 1300 units/hr, although on the low end (and trending down) CBC:  Hgb WNL, Plt remains low/stable No bleeding or complications noted  Goal of  Therapy:  Heparin level 0.3-0.7 units/ml Monitor platelets by anticoagulation protocol: Yes   Plan:  -Increase heparin infusion to 1350 units/hr -Daily heparin level and CBC -Follow up transition to oral anticoagulation if/when indicated   Pricilla Riffle, PharmD, BCPS Clinical Pharmacist 01/04/2023 7:19 AM

## 2023-01-04 NOTE — Progress Notes (Addendum)
Patient ID: Jon [name], male   DOB: 08-16-1955, 67 y.o.   MRN: [id number] S: Pt without complaints this morning.  States that his bp had been stable until yesterday. O:BP (!) 167/101   Pulse 70   Temp 98.4 F (36.9 C) (Oral)   Resp (!) 27   Ht 6' (1.829 m)   Wt 103.4 kg   SpO2 95%   BMI 30.92 kg/m   Intake/Output Summary (Last 24 hours) at 01/04/2023 1034 Last data filed at 01/04/2023 1001 Gross per 24 hour  Intake 548.86 ml  Output 2090 ml  Net -1541.14 ml   Intake/Output: I/O last 3 completed shifts: In: 1342.8 [P.O.:720; I.V.:466.7; IV Piggyback:156.1] Out: 3340 [Urine:3340]  Intake/Output this shift:  Total I/O In: 93.5 [I.V.:51.1; IV Piggyback:42.4] Out: 750 [Urine:750] Weight change:  Gen: NAD CVS: RRR Resp: CTA Abd: +BS, soft, NT/nD Ext: no edema, right forearm AVF +T/B  Recent Labs  Lab 01/02/23 1204 01/03/23 0041 01/04/23 0014  NA 139 139 138  K 3.4* 3.9 3.6  CL 103 107 105  CO2 28 23 25   GLUCOSE 154* 112* 144*  BUN 14 12 22   CREATININE 1.55* 1.47* 1.90*  ALBUMIN 4.0  --   --   CALCIUM 9.3 9.5 9.0  PHOS  --   --  3.8  AST 26  --   --   ALT 15  --   --    Liver Function Tests: Recent Labs  Lab 01/02/23 1204  AST 26  ALT 15  ALKPHOS 66  BILITOT 0.3  PROT 6.8  ALBUMIN 4.0   No results for input(s): "LIPASE", "AMYLASE" in the last 168 hours. No results for input(s): "AMMONIA" in the last 168 hours. CBC: Recent Labs  Lab 01/02/23 1204 01/03/23 0041 01/04/23 0014  WBC 6.0 6.9 5.7  HGB 13.1 13.9 13.5  HCT 42.1 46.1 43.8  MCV 88.4 89.5 89.8  PLT 129* 132* 132*   Cardiac Enzymes: No results for input(s): "CKTOTAL", "CKMB", "CKMBINDEX", "TROPONINI" in the last 168 hours. CBG: No results for input(s): "GLUCAP" in the last 168 hours.  Iron Studies: No results for input(s): "IRON", "TIBC", "TRANSFERRIN", "FERRITIN" in the last 72 hours. Studies/Results: US Renal Transplant w/Doppler  Result Date: 01/03/2023 CLINICAL DATA:  History of  hypertension EXAM: ULTRASOUND OF RENAL TRANSPLANT WITH RENAL DOPPLER ULTRASOUND TECHNIQUE: Ultrasound examination of the renal transplant was performed with gray-scale, color and duplex doppler evaluation. COMPARISON:  None Available. FINDINGS: Transplant kidney location: Right lower quadrant Transplant Kidney: Renal measurements: 9.9 x 5.8 x 5.6 cm. = volume: . Normal in size and parenchymal echogenicity. No evidence of mass or hydronephrosis. No peri-transplant fluid collection seen. Color flow in the main renal artery:  Present Color flow in the main renal vein:  Present Duplex Doppler Evaluation: Main Renal Artery Velocity: 131 cm/sec Main Renal Artery Resistive Index: 0.8 Venous waveform in main renal vein:  Present Intrarenal resistive index in upper pole:  0.7 (normal 0.6-0.8; equivocal 0.8-0.9; abnormal >= 0.9) Intrarenal resistive index in lower pole: 0.7 (normal 0.6-0.8; equivocal 0.8-0.9; abnormal >= 0.9) Bladder: Normal for degree of bladder distention. Other findings:  None. IMPRESSION: Unremarkable right lower quadrant renal transplant. Resistive indices are within normal limits. No mass or hydronephrosis is noted. Electronically Signed   By: Alcide Clever M.D.   On: 01/03/2023 19:55   ECHOCARDIOGRAM COMPLETE  Result Date: 01/03/2023    ECHOCARDIOGRAM REPORT   Patient Name:   Jon [name] Date of Exam: 01/03/2023 Medical Rec #:  [id number]  Height:       72.0 in Accession #:    [id number]  Weight:       228.0 lb Date of Birth:  03-31-1956   BSA:          2.253 m Patient Age:    66 years    BP:           153/91 mmHg Patient Gender: M           HR:           66 bpm. Exam Location:  Inpatient Procedure: 2D Echo, Cardiac Doppler, Color Doppler and Strain Analysis Indications:    Atrila Fibrillation  History:        Patient has prior history of Echocardiogram examinations, most                 recent 08/08/2017. Previous Myocardial Infarction and CAD,                 Arrythmias:Atrial Fibrillation,  Signs/Symptoms:Chest Pain; Risk                 Factors:Hypertension, Hx kidney transplant 2018 and                 Dyslipidemia.  Sonographer:    Wallie Char Referring Phys: [id number] MIR M [facility]  Sonographer Comments: Image acquisition challenging due to patient body habitus. Global longitudinal strain was attempted. IMPRESSIONS  1. Severe LVH with abnormal GLS suggest further w/u for amyloid or infiltrative disease. Left ventricular ejection fraction, by estimation, is 60 to 65%. The left ventricle has normal function. The left ventricle has no regional wall motion abnormalities. There is severe left ventricular hypertrophy. Left ventricular diastolic parameters are consistent with Grade I diastolic dysfunction (impaired relaxation). Elevated left ventricular end-diastolic pressure. The average left ventricular global longitudinal strain is -8.2 %. The global longitudinal strain is abnormal.  2. Right ventricular systolic function is normal. The right ventricular size is normal.  3. Left atrial size was moderately dilated.  4. The mitral valve is abnormal. No evidence of mitral valve regurgitation. No evidence of mitral stenosis. Moderate mitral annular calcification.  5. The aortic valve is tricuspid. There is mild calcification of the aortic valve. There is mild thickening of the aortic valve. Aortic valve regurgitation is not visualized. Aortic valve sclerosis is present, with no evidence of aortic valve stenosis.  6. Aortic dilatation noted. There is moderate dilatation of the aortic root, measuring 41 mm. There is mild dilatation of the ascending aorta, measuring 39 mm.  7. The inferior vena cava is normal in size with greater than 50% respiratory variability, suggesting right atrial pressure of 3 mmHg. FINDINGS  Left Ventricle: Severe LVH with abnormal GLS suggest further w/u for amyloid or infiltrative disease. Left ventricular ejection fraction, by estimation, is 60 to 65%. The left ventricle has  normal function. The left ventricle has no regional wall motion  abnormalities. The average left ventricular global longitudinal strain is -8.2 %. The global longitudinal strain is abnormal. The left ventricular internal cavity size was normal in size. There is severe left ventricular hypertrophy. Left ventricular diastolic parameters are consistent with Grade I diastolic dysfunction (impaired relaxation). Elevated left ventricular end-diastolic pressure. Right Ventricle: The right ventricular size is normal. No increase in right ventricular wall thickness. Right ventricular systolic function is normal. Left Atrium: Left atrial size was moderately dilated. Right Atrium: Right atrial size was normal in size. Pericardium: There is no evidence of pericardial effusion. Mitral  Valve: The mitral valve is abnormal. There is mild thickening of the mitral valve leaflet(s). There is mild calcification of the mitral valve leaflet(s). Moderate mitral annular calcification. No evidence of mitral valve regurgitation. No evidence  of mitral valve stenosis. MV peak gradient, 2.9 mmHg. The mean mitral valve gradient is 1.0 mmHg. Tricuspid Valve: The tricuspid valve is normal in structure. Tricuspid valve regurgitation is not demonstrated. No evidence of tricuspid stenosis. Aortic Valve: The aortic valve is tricuspid. There is mild calcification of the aortic valve. There is mild thickening of the aortic valve. Aortic valve regurgitation is not visualized. Aortic valve sclerosis is present, with no evidence of aortic valve stenosis. Aortic valve mean gradient measures 5.5 mmHg. Aortic valve peak gradient measures 11.1 mmHg. Aortic valve area, by VTI measures 2.71 cm. Pulmonic Valve: The pulmonic valve was normal in structure. Pulmonic valve regurgitation is not visualized. No evidence of pulmonic stenosis. Aorta: Aortic dilatation noted. There is moderate dilatation of the aortic root, measuring 41 mm. There is mild dilatation of  the ascending aorta, measuring 39 mm. Venous: The inferior vena cava is normal in size with greater than 50% respiratory variability, suggesting right atrial pressure of 3 mmHg. IAS/Shunts: No atrial level shunt detected by color flow Doppler.  LEFT VENTRICLE PLAX 2D LVIDd:         5.70 cm      Diastology LVIDs:         3.90 cm      LV e' medial:    4.18 cm/s LV PW:         1.60 cm      LV E/e' medial:  15.7 LV IVS:        1.60 cm      LV e' lateral:   2.79 cm/s LVOT diam:     2.20 cm      LV E/e' lateral: 23.6 LV SV:         85 LV SV Index:   38           2D Longitudinal Strain LVOT Area:     3.80 cm     2D Strain GLS Avg:     -8.2 %  LV Volumes (MOD) LV vol d, MOD A2C: 89.1 ml LV vol d, MOD A4C: 161.0 ml LV vol s, MOD A2C: 37.4 ml LV vol s, MOD A4C: 70.5 ml LV SV MOD A2C:     51.7 ml LV SV MOD A4C:     161.0 ml LV SV MOD BP:      68.8 ml RIGHT VENTRICLE             IVC RV S prime:     16.30 cm/s  IVC diam: 1.40 cm TAPSE (M-mode): 2.0 cm LEFT ATRIUM             Index        RIGHT ATRIUM           Index LA diam:        4.40 cm 1.95 cm/m   RA Area:     17.00 cm LA Vol (A2C):   67.2 ml 29.83 ml/m  RA Volume:   42.30 ml  18.78 ml/m LA Vol (A4C):   71.7 ml 31.83 ml/m LA Biplane Vol: 69.4 ml 30.81 ml/m  AORTIC VALVE AV Area (Vmax):    2.77 cm AV Area (Vmean):   2.86 cm AV Area (VTI):     2.71 cm AV Vmax:  166.50 cm/s AV Vmean:          108.500 cm/s AV VTI:            0.314 m AV Peak Grad:      11.1 mmHg AV Mean Grad:      5.5 mmHg LVOT Vmax:         121.50 cm/s LVOT Vmean:        81.500 cm/s LVOT VTI:          0.224 m LVOT/AV VTI ratio: 0.71  AORTA Ao Root diam: 4.10 cm Ao Asc diam:  3.90 cm MITRAL VALVE MV Area (PHT): 3.56 cm    SHUNTS MV Area VTI:   4.05 cm    Systemic VTI:  0.22 m MV Peak grad:  2.9 mmHg    Systemic Diam: 2.20 cm MV Mean grad:  1.0 mmHg MV Vmax:       0.86 m/s MV Vmean:      53.7 cm/s MV Decel Time: 213 msec MV E velocity: 65.80 cm/s MV A velocity: 96.50 cm/s MV E/A ratio:  0.68  Charlton Haws MD Electronically signed by Charlton Haws MD Signature Date/Time: 01/03/2023/3:37:58 PM    Final    DG Chest 2 View  Result Date: 01/02/2023 CLINICAL DATA:  Shortness of breath EXAM: CHEST - 2 VIEW COMPARISON:  X-ray 07/15/2022 and older FINDINGS: Normal cardiopericardial silhouette. No pneumothorax, effusion or edema. No consolidation. Overlapping cardiac leads. Films are under penetrated. IMPRESSION: No active cardiopulmonary disease. Electronically Signed   By: Karen Kays M.D.   On: 01/02/2023 12:43    amLODipine  10 mg Oral Daily   aspirin  81 mg Oral Daily   atorvastatin  80 mg Oral q1800   carvedilol  25 mg Oral BID WC   Chlorhexidine Gluconate Cloth  6 each Topical Daily   hydrALAZINE  100 mg Oral Q8H   isosorbide mononitrate  60 mg Oral Daily   latanoprost  1 drop Both Eyes QHS   mycophenolate  1,000 mg Oral BID   OXcarbazepine  300 mg Oral BID   polyethylene glycol  17 g Oral Daily   predniSONE  5 mg Oral Q breakfast   senna-docusate  1 tablet Oral BID   tacrolimus  2 mg Oral QHS   tacrolimus  3 mg Oral Daily    BMET    Component Value Date/Time   NA 138 01/04/2023 0014   NA 138 05/01/2019 0854   K 3.6 01/04/2023 0014   CL 105 01/04/2023 0014   CO2 25 01/04/2023 0014   GLUCOSE 144 (H) 01/04/2023 0014   BUN 22 01/04/2023 0014   BUN 8 05/01/2019 0854   CREATININE 1.90 (H) 01/04/2023 0014   CALCIUM 9.0 01/04/2023 0014   GFRNONAA 39 (L) 01/04/2023 0014   GFRAA 72 05/01/2019 0854   CBC    Component Value Date/Time   WBC 5.7 01/04/2023 0014   RBC 4.88 01/04/2023 0014   HGB 13.5 01/04/2023 0014   HCT 43.8 01/04/2023 0014   PLT 132 (L) 01/04/2023 0014   MCV 89.8 01/04/2023 0014   MCH 27.7 01/04/2023 0014   MCHC 30.8 01/04/2023 0014   RDW 13.8 01/04/2023 0014   LYMPHSABS 1.1 09/26/2021 0757   MONOABS 0.8 09/26/2021 0757   EOSABS 0.1 09/26/2021 0757   BASOSABS 0.0 09/26/2021 0757     Assessment/Plan:  AKI/CKD stage IIIa - presumably due to  overaggressive bp control.  His SBP dropped into the 90's.  Nitroglycerin drip stopped.  Continue to follow renal function and slowly lower Bp to goal of 130/80 or less but above 110/60 Hypertensive emergency - overcorrected with nitro gtt.  Now off.  Resumed home bp meds.  Continue to follow.  Do not believe this was related to tacrolimus given therapeutic level of 4.8 last week.  Agree with increasing hydralazine to 100 mg tid and follow. Chest pain - Cardiology consulted and suspects that it is musculoskeletal as well as postherpetic neuralgia from shingles.  Troponins trending down. New onset atrial fib - on heparin per cardiology.  Back in sinus rhythm. DDKT - baseline SCr around 1.5.  continue with tacrolimus 3 mg qam and 2 mg qpm, prednisone 5 mg daily, and cellcept 1000 mg bid.  TAC level therapeutic at 4.8 last week.  Bactrim MWF for PCP prophylaxis. No changes necessary. HTN/severe LVH - Cardiology following.  Irena Cords, MD [employer] [phone]

## 2023-01-04 NOTE — Progress Notes (Signed)
   01/04/23 1523  TOC Brief Assessment  Insurance and Status Reviewed  Patient has primary care physician Yes  Home environment has been reviewed home alone  Prior level of function: independent  Prior/Current Home Services No current home services  Social Determinants of Health Reivew SDOH reviewed no interventions necessary  Readmission risk has been reviewed Yes  Transition of care needs no transition of care needs at this time

## 2023-01-04 NOTE — TOC Benefit Eligibility Note (Signed)
Patient Advocate Encounter  Insurance verification completed.    The patient is currently admitted and upon discharge could be taking Eliquis 5 mg.  The current 30 day co-pay is $0.00.   The patient is currently admitted and upon discharge could be taking Xarelto 20 mg.  The current 30 day co-pay is $0.00.   The patient is insured through [employer] Medicare Part D   This test claim was processed through [facility]- copay amounts may vary at other pharmacies due to pharmacy/plan contracts, or as the patient moves through the different stages of their insurance plan.  Shawnya Mayor, CPHT Pharmacy Patient Advocate Specialist [facility] Patient Advocate Team Direct Number: [phone]  Fax: [phone]       

## 2023-01-04 NOTE — Progress Notes (Signed)
Cardiology Progress Note  Patient ID: Jon [name] MRN: [id number] DOB: 23-Dec-1955 Date of Encounter: 01/04/2023  Primary Cardiologist: None  Subjective   Chief Complaint: SOB  HPI: Still has right-sided chest discomfort.  Palpable pain.  Blood pressure is much improved.  Nitro drip has been weaned.  Still has frequent ectopy on telemetry.  Echo shows normal LV function with severe LVH which is unchanged from ultrasound at [facility].  Troponins continue to go down.  AKI noted.  ROS:  All other ROS reviewed and negative. Pertinent positives noted in the HPI.     Inpatient Medications  Scheduled Meds:  amLODipine  10 mg Oral Daily   aspirin  81 mg Oral Daily   atorvastatin  80 mg Oral q1800   carvedilol  25 mg Oral BID WC   Chlorhexidine Gluconate Cloth  6 each Topical Daily   hydrALAZINE  100 mg Oral Q8H   isosorbide mononitrate  60 mg Oral Daily   latanoprost  1 drop Both Eyes QHS   mycophenolate  1,000 mg Oral BID   OXcarbazepine  300 mg Oral BID   polyethylene glycol  17 g Oral Daily   predniSONE  5 mg Oral Q breakfast   senna-docusate  1 tablet Oral BID   tacrolimus  2 mg Oral QHS   tacrolimus  3 mg Oral Daily   Continuous Infusions:  heparin 1,350 Units/hr (01/04/23 0734)   PRN Meds: acetaminophen **OR** acetaminophen, hydrALAZINE, labetalol, morphine injection, nitroGLYCERIN, ondansetron **OR** ondansetron (ZOFRAN) IV, mouth rinse, pneumococcal 20-valent conjugate vaccine   Vital Signs   Vitals:   01/04/23 0500 01/04/23 0600 01/04/23 0734 01/04/23 0800  BP: 126/83 (!) 142/85 (!) 152/88   Pulse: 66 66 67   Resp: 14 (!) 24    Temp:    98.4 F (36.9 C)  TempSrc:    Oral  SpO2: 94% 94%    Weight:      Height:        Intake/Output Summary (Last 24 hours) at 01/04/2023 0935 Last data filed at 01/04/2023 0737 Gross per 24 hour  Intake 455.38 ml  Output 1790 ml  Net -1334.62 ml      01/02/2023    5:05 PM 11/05/2022    8:23 AM 07/15/2022    6:39 PM  Last  3 Weights  Weight (lbs) 228 lb 234 lb 8 oz 236 lb  Weight (kg) 103.42 kg 106.369 kg 107.049 kg      Telemetry  Overnight telemetry shows sinus rhythm 70s, frequent PVCs, which I personally reviewed.   ECG  The most recent ECG shows sinus rhythm with LVH, which I personally reviewed.   Physical Exam   Vitals:   01/04/23 0500 01/04/23 0600 01/04/23 0734 01/04/23 0800  BP: 126/83 (!) 142/85 (!) 152/88   Pulse: 66 66 67   Resp: 14 (!) 24    Temp:    98.4 F (36.9 C)  TempSrc:    Oral  SpO2: 94% 94%    Weight:      Height:        Intake/Output Summary (Last 24 hours) at 01/04/2023 0935 Last data filed at 01/04/2023 0737 Gross per 24 hour  Intake 455.38 ml  Output 1790 ml  Net -1334.62 ml       01/02/2023    5:05 PM 11/05/2022    8:23 AM 07/15/2022    6:39 PM  Last 3 Weights  Weight (lbs) 228 lb 234 lb 8 oz 236 lb  Weight (kg)  103.42 kg 106.369 kg 107.049 kg    Body mass index is 30.92 kg/m.  General: Well nourished, well developed, in no acute distress Head: Atraumatic, normal size  Eyes: PEERLA, EOMI  Neck: Supple, no JVD Endocrine: No thryomegaly Cardiac: Normal S1, S2; RRR; no murmurs, rubs, or gallops Lungs: Clear to auscultation bilaterally, no wheezing, rhonchi or rales  Abd: Soft, nontender, no hepatomegaly  Ext: No edema, pulses 2+ Musculoskeletal: No deformities, BUE and BLE strength normal and equal Skin: Warm and dry, no rashes   Neuro: Alert and oriented to person, place, time, and situation, CNII-XII grossly intact, no focal deficits  Psych: Normal mood and affect   Labs  High Sensitivity Troponin:   Recent Labs  Lab 01/02/23 1843 01/03/23 0631 01/03/23 1224 01/03/23 1913 01/04/23 0014  TROPONINIHS 103* 109* 108* 68* 80*     Cardiac EnzymesNo results for input(s): "TROPONINI" in the last 168 hours. No results for input(s): "TROPIPOC" in the last 168 hours.  Chemistry Recent Labs  Lab 01/02/23 1204 01/03/23 0041 01/04/23 0014  NA 139 139 138   K 3.4* 3.9 3.6  CL 103 107 105  CO2 28 23 25   GLUCOSE 154* 112* 144*  BUN 14 12 22   CREATININE 1.55* 1.47* 1.90*  CALCIUM 9.3 9.5 9.0  PROT 6.8  --   --   ALBUMIN 4.0  --   --   AST 26  --   --   ALT 15  --   --   ALKPHOS 66  --   --   BILITOT 0.3  --   --   GFRNONAA 50* 53* 39*  ANIONGAP 8 9 8     Hematology Recent Labs  Lab 01/02/23 1204 01/03/23 0041 01/04/23 0014  WBC 6.0 6.9 5.7  RBC 4.76 5.15 4.88  HGB 13.1 13.9 13.5  HCT 42.1 46.1 43.8  MCV 88.4 89.5 89.8  MCH 27.5 27.0 27.7  MCHC 31.1 30.2 30.8  RDW 13.6 13.7 13.8  PLT 129* 132* 132*   BNP Recent Labs  Lab 01/02/23 1356  BNP 543.6*    DDimer  Recent Labs  Lab 01/02/23 1356  DDIMER 0.27     Radiology  US Renal Transplant w/Doppler  Result Date: 01/03/2023 CLINICAL DATA:  History of hypertension EXAM: ULTRASOUND OF RENAL TRANSPLANT WITH RENAL DOPPLER ULTRASOUND TECHNIQUE: Ultrasound examination of the renal transplant was performed with gray-scale, color and duplex doppler evaluation. COMPARISON:  None Available. FINDINGS: Transplant kidney location: Right lower quadrant Transplant Kidney: Renal measurements: 9.9 x 5.8 x 5.6 cm. = volume: . Normal in size and parenchymal echogenicity. No evidence of mass or hydronephrosis. No peri-transplant fluid collection seen. Color flow in the main renal artery:  Present Color flow in the main renal vein:  Present Duplex Doppler Evaluation: Main Renal Artery Velocity: 131 cm/sec Main Renal Artery Resistive Index: 0.8 Venous waveform in main renal vein:  Present Intrarenal resistive index in upper pole:  0.7 (normal 0.6-0.8; equivocal 0.8-0.9; abnormal >= 0.9) Intrarenal resistive index in lower pole: 0.7 (normal 0.6-0.8; equivocal 0.8-0.9; abnormal >= 0.9) Bladder: Normal for degree of bladder distention. Other findings:  None. IMPRESSION: Unremarkable right lower quadrant renal transplant. Resistive indices are within normal limits. No mass or hydronephrosis is noted.  Electronically Signed   By: Alcide Clever M.D.   On: 01/03/2023 19:55   ECHOCARDIOGRAM COMPLETE  Result Date: 01/03/2023    ECHOCARDIOGRAM REPORT   Patient Name:   Jon [name] Date of Exam: 01/03/2023 Medical Rec #:  [id number]   Height:       72.0 in Accession #:    [id number]  Weight:       228.0 lb Date of Birth:  July 15, 1956   BSA:          2.253 m Patient Age:    66 years    BP:           153/91 mmHg Patient Gender: M           HR:           66 bpm. Exam Location:  Inpatient Procedure: 2D Echo, Cardiac Doppler, Color Doppler and Strain Analysis Indications:    Atrila Fibrillation  History:        Patient has prior history of Echocardiogram examinations, most                 recent 08/08/2017. Previous Myocardial Infarction and CAD,                 Arrythmias:Atrial Fibrillation, Signs/Symptoms:Chest Pain; Risk                 Factors:Hypertension, Hx kidney transplant 2018 and                 Dyslipidemia.  Sonographer:    Wallie Char Referring Phys: [id number] MIR M [facility]  Sonographer Comments: Image acquisition challenging due to patient body habitus. Global longitudinal strain was attempted. IMPRESSIONS  1. Severe LVH with abnormal GLS suggest further w/u for amyloid or infiltrative disease. Left ventricular ejection fraction, by estimation, is 60 to 65%. The left ventricle has normal function. The left ventricle has no regional wall motion abnormalities. There is severe left ventricular hypertrophy. Left ventricular diastolic parameters are consistent with Grade I diastolic dysfunction (impaired relaxation). Elevated left ventricular end-diastolic pressure. The average left ventricular global longitudinal strain is -8.2 %. The global longitudinal strain is abnormal.  2. Right ventricular systolic function is normal. The right ventricular size is normal.  3. Left atrial size was moderately dilated.  4. The mitral valve is abnormal. No evidence of mitral valve regurgitation. No evidence of mitral stenosis.  Moderate mitral annular calcification.  5. The aortic valve is tricuspid. There is mild calcification of the aortic valve. There is mild thickening of the aortic valve. Aortic valve regurgitation is not visualized. Aortic valve sclerosis is present, with no evidence of aortic valve stenosis.  6. Aortic dilatation noted. There is moderate dilatation of the aortic root, measuring 41 mm. There is mild dilatation of the ascending aorta, measuring 39 mm.  7. The inferior vena cava is normal in size with greater than 50% respiratory variability, suggesting right atrial pressure of 3 mmHg. FINDINGS  Left Ventricle: Severe LVH with abnormal GLS suggest further w/u for amyloid or infiltrative disease. Left ventricular ejection fraction, by estimation, is 60 to 65%. The left ventricle has normal function. The left ventricle has no regional wall motion  abnormalities. The average left ventricular global longitudinal strain is -8.2 %. The global longitudinal strain is abnormal. The left ventricular internal cavity size was normal in size. There is severe left ventricular hypertrophy. Left ventricular diastolic parameters are consistent with Grade I diastolic dysfunction (impaired relaxation). Elevated left ventricular end-diastolic pressure. Right Ventricle: The right ventricular size is normal. No increase in right ventricular wall thickness. Right ventricular systolic function is normal. Left Atrium: Left atrial size was moderately dilated. Right Atrium: Right atrial size was normal in size. Pericardium: There is no evidence of pericardial  effusion. Mitral Valve: The mitral valve is abnormal. There is mild thickening of the mitral valve leaflet(s). There is mild calcification of the mitral valve leaflet(s). Moderate mitral annular calcification. No evidence of mitral valve regurgitation. No evidence  of mitral valve stenosis. MV peak gradient, 2.9 mmHg. The mean mitral valve gradient is 1.0 mmHg. Tricuspid Valve: The  tricuspid valve is normal in structure. Tricuspid valve regurgitation is not demonstrated. No evidence of tricuspid stenosis. Aortic Valve: The aortic valve is tricuspid. There is mild calcification of the aortic valve. There is mild thickening of the aortic valve. Aortic valve regurgitation is not visualized. Aortic valve sclerosis is present, with no evidence of aortic valve stenosis. Aortic valve mean gradient measures 5.5 mmHg. Aortic valve peak gradient measures 11.1 mmHg. Aortic valve area, by VTI measures 2.71 cm. Pulmonic Valve: The pulmonic valve was normal in structure. Pulmonic valve regurgitation is not visualized. No evidence of pulmonic stenosis. Aorta: Aortic dilatation noted. There is moderate dilatation of the aortic root, measuring 41 mm. There is mild dilatation of the ascending aorta, measuring 39 mm. Venous: The inferior vena cava is normal in size with greater than 50% respiratory variability, suggesting right atrial pressure of 3 mmHg. IAS/Shunts: No atrial level shunt detected by color flow Doppler.  LEFT VENTRICLE PLAX 2D LVIDd:         5.70 cm      Diastology LVIDs:         3.90 cm      LV e' medial:    4.18 cm/s LV PW:         1.60 cm      LV E/e' medial:  15.7 LV IVS:        1.60 cm      LV e' lateral:   2.79 cm/s LVOT diam:     2.20 cm      LV E/e' lateral: 23.6 LV SV:         85 LV SV Index:   38           2D Longitudinal Strain LVOT Area:     3.80 cm     2D Strain GLS Avg:     -8.2 %  LV Volumes (MOD) LV vol d, MOD A2C: 89.1 ml LV vol d, MOD A4C: 161.0 ml LV vol s, MOD A2C: 37.4 ml LV vol s, MOD A4C: 70.5 ml LV SV MOD A2C:     51.7 ml LV SV MOD A4C:     161.0 ml LV SV MOD BP:      68.8 ml RIGHT VENTRICLE             IVC RV S prime:     16.30 cm/s  IVC diam: 1.40 cm TAPSE (M-mode): 2.0 cm LEFT ATRIUM             Index        RIGHT ATRIUM           Index LA diam:        4.40 cm 1.95 cm/m   RA Area:     17.00 cm LA Vol (A2C):   67.2 ml 29.83 ml/m  RA Volume:   42.30 ml  18.78 ml/m  LA Vol (A4C):   71.7 ml 31.83 ml/m LA Biplane Vol: 69.4 ml 30.81 ml/m  AORTIC VALVE AV Area (Vmax):    2.77 cm AV Area (Vmean):   2.86 cm AV Area (VTI):     2.71 cm AV Vmax:  166.50 cm/s AV Vmean:          108.500 cm/s AV VTI:            0.314 m AV Peak Grad:      11.1 mmHg AV Mean Grad:      5.5 mmHg LVOT Vmax:         121.50 cm/s LVOT Vmean:        81.500 cm/s LVOT VTI:          0.224 m LVOT/AV VTI ratio: 0.71  AORTA Ao Root diam: 4.10 cm Ao Asc diam:  3.90 cm MITRAL VALVE MV Area (PHT): 3.56 cm    SHUNTS MV Area VTI:   4.05 cm    Systemic VTI:  0.22 m MV Peak grad:  2.9 mmHg    Systemic Diam: 2.20 cm MV Mean grad:  1.0 mmHg MV Vmax:       0.86 m/s MV Vmean:      53.7 cm/s MV Decel Time: 213 msec MV E velocity: 65.80 cm/s MV A velocity: 96.50 cm/s MV E/A ratio:  0.68 Charlton Haws MD Electronically signed by Charlton Haws MD Signature Date/Time: 01/03/2023/3:37:58 PM    Final    DG Chest 2 View  Result Date: 01/02/2023 CLINICAL DATA:  Shortness of breath EXAM: CHEST - 2 VIEW COMPARISON:  X-ray 07/15/2022 and older FINDINGS: Normal cardiopericardial silhouette. No pneumothorax, effusion or edema. No consolidation. Overlapping cardiac leads. Films are under penetrated. IMPRESSION: No active cardiopulmonary disease. Electronically Signed   By: Karen Kays M.D.   On: 01/02/2023 12:43    Cardiac Studies  TTE 01/03/2023  1. Severe LVH with abnormal GLS suggest further w/u for amyloid or  infiltrative disease. Left ventricular ejection fraction, by estimation,  is 60 to 65%. The left ventricle has normal function. The left ventricle  has no regional wall motion  abnormalities. There is severe left ventricular hypertrophy. Left  ventricular diastolic parameters are consistent with Grade I diastolic  dysfunction (impaired relaxation). Elevated left ventricular end-diastolic  pressure. The average left ventricular  global longitudinal strain is -8.2 %. The global longitudinal strain is  abnormal.    2. Right ventricular systolic function is normal. The right ventricular  size is normal.   3. Left atrial size was moderately dilated.   4. The mitral valve is abnormal. No evidence of mitral valve  regurgitation. No evidence of mitral stenosis. Moderate mitral annular  calcification.   5. The aortic valve is tricuspid. There is mild calcification of the  aortic valve. There is mild thickening of the aortic valve. Aortic valve  regurgitation is not visualized. Aortic valve sclerosis is present, with  no evidence of aortic valve stenosis.   6. Aortic dilatation noted. There is moderate dilatation of the aortic  root, measuring 41 mm. There is mild dilatation of the ascending aorta,  measuring 39 mm.   7. The inferior vena cava is normal in size with greater than 50%  respiratory variability, suggesting right atrial pressure of 3 mmHg.   Patient Profile  67 year old male with history of ESRD status post renal transplant, uncontrolled hypertension, hyperlipidemia, obesity who was admitted on 01/02/2023 with chest pain, new onset A-fib and elevated troponin.   Assessment & Plan   # Chest pain, suspected musculoskeletal etiology # Shingles with postherpetic neuralgia -Admitted with hypertensive crisis.  Troponins are minimally elevated due to this.  His chest pain is actually exquisitely tender in the right chest.  He reports having shingles on the side.  I suspect this is the culprit of his chest discomfort.  See discussion below 1 elevated troponin and hypertensive crisis. -No plans for invasive angiography.  Now with AKI in transplanted kidney.  # Hypertensive crisis # Demand ischemia # Severe LVH, suspect hypertensive heart disease -Admitted with poorly controlled blood pressures.  Troponins have been minimally elevated and flat.  EKG with LVH and repolarization abnormality.  His echo shows severe LVH which was known at [facility].  He has no wall motion abnormality.  I suspect  his troponin elevation is secondary to accelerated hypertension.  Troponins are trending down. -Okay to complete 48 hours of heparin.  Will likely transition to DOAC due to A-fib tomorrow.  I do not think this represents obstructive epicardial disease. -For his blood pressure he has been weaned off nitroglycerin drip.  Continue carvedilol 25 mg twice daily.  Increase hydralazine to 100 mg 3 times daily.  Added Imdur 60 mg daily.  We can titrate up as needed.  He does have an AKI.  Would be a great candidate for Entresto but we will not do this currently.  Still on amlodipine.  Neck step would be clonidine. -I have a low suspicion for cardiac amyloidosis.  I think this represents hypertensive heart disease.  I did review the strain pattern.  It is not consistent with amyloid.  There is no apical sparing.  Given his history of poorly controlled hypertension for years I think this is the likely explanation.  # New onset A-fib -Back in sinus rhythm. -On heparin for now.  Likely transition to DOAC tomorrow.  # PVCs -Asymptomatic.  Suspect secondary to hypertensive heart disease.  Continue beta-blocker for now.  # History of renal transplant # AKI -Suspect the AKI is due to hypertensive crisis.  Close monitoring of urine output.  Nephrology is following.  They do not believe his tacrolimus could be contributing to his hypotension.  They recommend to continue this medication for now.      For questions or updates, please contact [facility] Please consult [email or web address] for contact info under        Signed, Gerri Spore T. Flora Lipps, MD, [facility]  [facility]  01/04/2023 9:35 AM

## 2023-01-04 NOTE — Assessment & Plan Note (Signed)
-   episode of shingles about 1 year ago but patient has residual tenderness along right chest which may also be contributing to his "chest pain" - Low-dose gabapentin started for trial

## 2023-01-04 NOTE — Progress Notes (Signed)
Progress Note    Jon [name]   WUJ:[id number]  DOB: [birth year]  DOA: 01/02/2023     2 PCP: Loura Back, NP  Initial CC: CP  Hospital Course: Mr. Cuffe is a 67 yo male with PMH CAD, kidney transplant 2018, CKD3a, HLD, HTN, GERD who presented with chest pain.  He was woken from sleep with the pain and presented for further evaluation to the ER.  Pain was described as burning in nature and located along his sternum.  Blood pressure was uncontrolled on workup and he required multiple agents for control.  Troponin was also elevated and trended.  EKG showed T wave inversions.  Cardiology was consulted and patient was placed on heparin drip. Blood pressure was persistently elevated and he was ultimately started on a nitroglycerin drip.  Some of his chest pain was considered related to recent shingles with residual hypersensitivity/tenderness.  Etiology of his troponin leak and EKG changes was considered due to excessively elevated blood pressure.  He was not felt to warrant catheterization.  Interval History:  No events overnight.  Chest pain has resolved.  He was resting in bed comfortably.  Blood pressure also was normalized this morning on the monitor.  Assessment and Plan: * Elevated troponin - differential includes demand ischemia 2/2 uncontrolled HTN vs NSTEMI - cardiology following as well; etiology believed to be due to uncontrolled HTN (now improved) - on heparin drip with plans to transition to DOAC for afib as noted - echo obtained and reviewed by cardiology as well -Troponins peaked at 124 and have since down trended  Paroxysmal atrial fibrillation (HCC) - New finding on admission - Initiated on heparin drip due to elevated troponin; eventually will transition to DOAC (prob Sat) - Continue on telemetry - TSH normal  Acute renal failure superimposed on stage 3a chronic kidney disease (HCC) - creat at baseline on admission (1.5) and has uptrended some today, 1.9. May be due to  uncontrolled BP on admission vs the rapid normalization (did have drop to 99/58 noted overnight) - BP now recovering - for now, trend BMP (normal renal doppler)  Kidney transplant recipient - Continue mycophenolate, prednisone, tacrolimus - Appreciate nephrology evaluation.  Not felt to have blood pressure changes related to tacrolimus - Blood pressure regimen has been modified, see separate problem  HTN (hypertension) - Uncontrolled on admission - Home regimen includes amlodipine, Coreg, hydralazine - regimen has been modified; now off NTG drip - regimen to be modified as needed; currently nephrology and cardiology managing  Shingles - Recent episode of shingles, still with tenderness along right chest which may also be contributing to his "chest pain" - Low-dose gabapentin started for trial  Thrombocytopenia (HCC) - baseline around 115k-120k  Anemia due to chronic kidney disease - Hemoglobin normal range.  Baseline 13 to 14 g/dL   Old records reviewed in assessment of this patient  Antimicrobials:   DVT prophylaxis:  SCDs Start: 01/02/23 1651   Code Status:   Code Status: Full Code  Mobility Assessment (last 72 hours)     Mobility Assessment   No documentation.           Barriers to discharge:  Disposition Plan:  Home Status is: Inpt  Objective: Blood pressure (!) 167/101, pulse 70, temperature 97.7 F (36.5 C), temperature source Oral, resp. rate (!) 27, height 6' (1.829 m), weight 103.4 kg, SpO2 95 %.  Examination:  Physical Exam Constitutional:      General: He is not in acute distress.  Appearance: Normal appearance.  HENT:     Head: Normocephalic and atraumatic.     Mouth/Throat:     Mouth: Mucous membranes are moist.  Eyes:     Extraocular Movements: Extraocular movements intact.  Cardiovascular:     Rate and Rhythm: Normal rate and regular rhythm.  Pulmonary:     Effort: Pulmonary effort is normal. No respiratory distress.     Breath  sounds: Normal breath sounds. No wheezing.  Abdominal:     General: Bowel sounds are normal. There is no distension.     Palpations: Abdomen is soft.     Tenderness: There is no abdominal tenderness.  Musculoskeletal:        General: Normal range of motion.     Cervical back: Normal range of motion and neck supple.  Skin:    General: Skin is warm and dry.  Neurological:     General: No focal deficit present.     Mental Status: He is alert.  Psychiatric:        Mood and Affect: Mood normal.        Behavior: Behavior normal.      Consultants:  Cardiology Nephrology  Procedures:    Data Reviewed: Results for orders placed or performed during the hospital encounter of 01/02/23 (from the past 24 hour(s))  Troponin I (High Sensitivity)     Status: Abnormal   Collection Time: 01/03/23  7:13 PM  Result Value Ref Range   Troponin I (High Sensitivity) 68 (H) <18 ng/L  Protein / creatinine ratio, urine     Status: None   Collection Time: 01/03/23  9:44 PM  Result Value Ref Range   Creatinine, Urine 149 mg/dL   Total Protein, Urine 21 mg/dL   Protein Creatinine Ratio 0.14 0.00 - 0.15 mg/mg[Cre]  Urinalysis, Routine w reflex microscopic -Urine, Clean Catch     Status: Abnormal   Collection Time: 01/03/23  9:44 PM  Result Value Ref Range   Color, Urine YELLOW YELLOW   APPearance CLEAR CLEAR   Specific Gravity, Urine 1.011 1.005 - 1.030   pH 7.0 5.0 - 8.0   Glucose, UA NEGATIVE NEGATIVE mg/dL   Hgb urine dipstick NEGATIVE NEGATIVE   Bilirubin Urine NEGATIVE NEGATIVE   Ketones, ur NEGATIVE NEGATIVE mg/dL   Protein, ur NEGATIVE NEGATIVE mg/dL   Nitrite NEGATIVE NEGATIVE   Leukocytes,Ua TRACE (A) NEGATIVE   RBC / HPF 0-5 0 - 5 RBC/hpf   WBC, UA 0-5 0 - 5 WBC/hpf   Bacteria, UA NONE SEEN NONE SEEN   Squamous Epithelial / HPF 0-5 0 - 5 /HPF   Mucus PRESENT    Hyaline Casts, UA PRESENT   CBC     Status: Abnormal   Collection Time: 01/04/23 12:14 AM  Result Value Ref Range    WBC 5.7 4.0 - 10.5 K/uL   RBC 4.88 4.22 - 5.81 MIL/uL   Hemoglobin 13.5 13.0 - 17.0 g/dL   HCT 16.1 09.6 - 04.5 %   MCV 89.8 80.0 - 100.0 fL   MCH 27.7 26.0 - 34.0 pg   MCHC 30.8 30.0 - 36.0 g/dL   RDW 40.9 81.1 - 91.4 %   Platelets 132 (L) 150 - 400 K/uL   nRBC 0.0 0.0 - 0.2 %  Heparin level (unfractionated)     Status: None   Collection Time: 01/04/23 12:14 AM  Result Value Ref Range   Heparin Unfractionated 0.30 0.30 - 0.70 IU/mL  Lipid panel     Status: Abnormal  Collection Time: 01/04/23 12:14 AM  Result Value Ref Range   Cholesterol 107 0 - 200 mg/dL   Triglycerides 160 (H) <150 mg/dL   HDL 31 (L) >10 mg/dL   Total CHOL/HDL Ratio 3.5 RATIO   VLDL 45 (H) 0 - 40 mg/dL   LDL Cholesterol 31 0 - 99 mg/dL  Basic metabolic panel     Status: Abnormal   Collection Time: 01/04/23 12:14 AM  Result Value Ref Range   Sodium 138 135 - 145 mmol/L   Potassium 3.6 3.5 - 5.1 mmol/L   Chloride 105 98 - 111 mmol/L   CO2 25 22 - 32 mmol/L   Glucose, Bld 144 (H) 70 - 99 mg/dL   BUN 22 8 - 23 mg/dL   Creatinine, Ser 9.32 (H) 0.61 - 1.24 mg/dL   Calcium 9.0 8.9 - 35.5 mg/dL   GFR, Estimated 39 (L) >60 mL/min   Anion gap 8 5 - 15  Magnesium     Status: None   Collection Time: 01/04/23 12:14 AM  Result Value Ref Range   Magnesium 1.8 1.7 - 2.4 mg/dL  Troponin I (High Sensitivity)     Status: Abnormal   Collection Time: 01/04/23 12:14 AM  Result Value Ref Range   Troponin I (High Sensitivity) 80 (H) <18 ng/L  Phosphorus     Status: None   Collection Time: 01/04/23 12:14 AM  Result Value Ref Range   Phosphorus 3.8 2.5 - 4.6 mg/dL    I have reviewed pertinent nursing notes, vitals, labs, and images as necessary. I have ordered labwork to follow up on as indicated.  I have reviewed the last notes from staff over past 24 hours. I have discussed patient's care plan and test results with nursing staff, CM/SW, and other staff as appropriate.  Time spent: Greater than 50% of the 55 minute  visit was spent in counseling/coordination of care for the patient as laid out in the A&P.   LOS: 2 days   Lewie Chamber, MD [facility] 01/04/2023, 1:58 PM

## 2023-01-04 NOTE — Assessment & Plan Note (Signed)
-   creat at baseline on admission (1.5) and has uptrended some today, 1.9. May be due to uncontrolled BP on admission vs the rapid normalization (did have drop to 99/58 noted overnight) - BP now recovering - for now, trend BMP (normal renal doppler)

## 2023-01-05 ENCOUNTER — Encounter (HOSPITAL_COMMUNITY): Payer: Self-pay | Admitting: Internal Medicine

## 2023-01-05 DIAGNOSIS — N179 Acute kidney failure, unspecified: Secondary | ICD-10-CM | POA: Diagnosis not present

## 2023-01-05 DIAGNOSIS — R7989 Other specified abnormal findings of blood chemistry: Secondary | ICD-10-CM | POA: Diagnosis not present

## 2023-01-05 DIAGNOSIS — Z940 Kidney transplant status: Secondary | ICD-10-CM | POA: Diagnosis not present

## 2023-01-05 DIAGNOSIS — I10 Essential (primary) hypertension: Secondary | ICD-10-CM | POA: Diagnosis not present

## 2023-01-05 DIAGNOSIS — I480 Paroxysmal atrial fibrillation: Secondary | ICD-10-CM | POA: Diagnosis not present

## 2023-01-05 LAB — CBC
HCT: 41.1 % (ref 39.0–52.0)
Hemoglobin: 12.4 g/dL — ABNORMAL LOW (ref 13.0–17.0)
MCH: 27.2 pg (ref 26.0–34.0)
MCHC: 30.2 g/dL (ref 30.0–36.0)
MCV: 90.1 fL (ref 80.0–100.0)
Platelets: 119 10*3/uL — ABNORMAL LOW (ref 150–400)
RBC: 4.56 MIL/uL (ref 4.22–5.81)
RDW: 13.7 % (ref 11.5–15.5)
WBC: 6 10*3/uL (ref 4.0–10.5)
nRBC: 0 % (ref 0.0–0.2)

## 2023-01-05 LAB — BASIC METABOLIC PANEL
Anion gap: 11 (ref 5–15)
BUN: 25 mg/dL — ABNORMAL HIGH (ref 8–23)
CO2: 21 mmol/L — ABNORMAL LOW (ref 22–32)
Calcium: 9.1 mg/dL (ref 8.9–10.3)
Chloride: 104 mmol/L (ref 98–111)
Creatinine, Ser: 1.76 mg/dL — ABNORMAL HIGH (ref 0.61–1.24)
GFR, Estimated: 43 mL/min — ABNORMAL LOW (ref >60)
Glucose, Bld: 116 mg/dL — ABNORMAL HIGH (ref 70–99)
Potassium: 3.9 mmol/L (ref 3.5–5.1)
Sodium: 136 mmol/L (ref 135–145)

## 2023-01-05 LAB — HEPARIN LEVEL (UNFRACTIONATED): Heparin Unfractionated: 0.24 IU/mL — ABNORMAL LOW (ref 0.30–0.70)

## 2023-01-05 LAB — MAGNESIUM: Magnesium: 1.8 mg/dL (ref 1.7–2.4)

## 2023-01-05 MED ORDER — APIXABAN 5 MG PO TABS
5.0000 mg | ORAL_TABLET | Freq: Two times a day (BID) | ORAL | Status: DC
  Administered 2023-01-05 – 2023-01-06 (×3): 5 mg via ORAL
  Filled 2023-01-05 (×3): qty 1

## 2023-01-05 MED ORDER — HEPARIN BOLUS VIA INFUSION
1500.0000 [IU] | Freq: Once | INTRAVENOUS | Status: AC
  Administered 2023-01-05: 1500 [IU] via INTRAVENOUS
  Filled 2023-01-05: qty 1500

## 2023-01-05 NOTE — Progress Notes (Signed)
Cardiology Progress Note  Patient ID: Jon [name] MRN: [id number] DOB: 02-16-1956 Date of Encounter: 01/05/2023  Primary Cardiologist: None  Subjective   Chief Complaint: Chest pain  HPI: Still with right-sided chest discomfort.  Likely related to shingles.  Tender to palpation.  Blood pressure is much improved.  Can stop heparin drip.  ROS:  All other ROS reviewed and negative. Pertinent positives noted in the HPI.     Inpatient Medications  Scheduled Meds:  amLODipine  10 mg Oral Daily   apixaban  5 mg Oral BID   aspirin  81 mg Oral Daily   atorvastatin  80 mg Oral q1800   carvedilol  25 mg Oral BID WC   Chlorhexidine Gluconate Cloth  6 each Topical Daily   gabapentin  100 mg Oral TID   hydrALAZINE  100 mg Oral Q8H   isosorbide mononitrate  60 mg Oral Daily   latanoprost  1 drop Both Eyes QHS   mycophenolate  1,000 mg Oral BID   OXcarbazepine  300 mg Oral BID   polyethylene glycol  17 g Oral Daily   predniSONE  5 mg Oral Q breakfast   senna-docusate  1 tablet Oral BID   tacrolimus  2 mg Oral QHS   tacrolimus  3 mg Oral Daily   Continuous Infusions:  PRN Meds: acetaminophen **OR** acetaminophen, hydrALAZINE, labetalol, morphine injection, nitroGLYCERIN, ondansetron **OR** ondansetron (ZOFRAN) IV, mouth rinse, pneumococcal 20-valent conjugate vaccine   Vital Signs   Vitals:   01/05/23 0300 01/05/23 0400 01/05/23 0500 01/05/23 0600  BP: 136/78 125/77 (!) 173/95 130/83  Pulse: 77 65 68 63  Resp: 13 15 18 13   Temp:  97.8 F (36.6 C)    TempSrc:  Oral    SpO2: 94% 94% 95% 93%  Weight:      Height:        Intake/Output Summary (Last 24 hours) at 01/05/2023 0753 Last data filed at 01/05/2023 0000 Gross per 24 hour  Intake 207.78 ml  Output 825 ml  Net -617.22 ml      01/02/2023    5:05 PM 11/05/2022    8:23 AM 07/15/2022    6:39 PM  Last 3 Weights  Weight (lbs) 228 lb 234 lb 8 oz 236 lb  Weight (kg) 103.42 kg 106.369 kg 107.049 kg      Telemetry  Overnight  telemetry shows sinus rhythm 70s, frequent PVCs, which I personally reviewed.    Physical Exam   Vitals:   01/05/23 0300 01/05/23 0400 01/05/23 0500 01/05/23 0600  BP: 136/78 125/77 (!) 173/95 130/83  Pulse: 77 65 68 63  Resp: 13 15 18 13   Temp:  97.8 F (36.6 C)    TempSrc:  Oral    SpO2: 94% 94% 95% 93%  Weight:      Height:        Intake/Output Summary (Last 24 hours) at 01/05/2023 0753 Last data filed at 01/05/2023 0000 Gross per 24 hour  Intake 207.78 ml  Output 825 ml  Net -617.22 ml       01/02/2023    5:05 PM 11/05/2022    8:23 AM 07/15/2022    6:39 PM  Last 3 Weights  Weight (lbs) 228 lb 234 lb 8 oz 236 lb  Weight (kg) 103.42 kg 106.369 kg 107.049 kg    Body mass index is 30.92 kg/m.  General: Well nourished, well developed, in no acute distress Head: Atraumatic, normal size  Eyes: PEERLA, EOMI  Neck: Supple, no JVD Endocrine:  No thryomegaly Cardiac: Normal S1, S2; RRR; no murmurs, rubs, or gallops Lungs: Clear to auscultation bilaterally, no wheezing, rhonchi or rales  Abd: Soft, nontender, no hepatomegaly  Ext: No edema, pulses 2+ Musculoskeletal: No deformities, BUE and BLE strength normal and equal Skin: Warm and dry, no rashes   Neuro: Alert and oriented to person, place, time, and situation, CNII-XII grossly intact, no focal deficits  Psych: Normal mood and affect   Labs  High Sensitivity Troponin:   Recent Labs  Lab 01/02/23 1843 01/03/23 0631 01/03/23 1224 01/03/23 1913 01/04/23 0014  TROPONINIHS 103* 109* 108* 68* 80*     Cardiac EnzymesNo results for input(s): "TROPONINI" in the last 168 hours. No results for input(s): "TROPIPOC" in the last 168 hours.  Chemistry Recent Labs  Lab 01/02/23 1204 01/03/23 0041 01/04/23 0014 01/05/23 0302  NA 139 139 138 136  K 3.4* 3.9 3.6 3.9  CL 103 107 105 104  CO2 28 23 25  21*  GLUCOSE 154* 112* 144* 116*  BUN 14 12 22  25*  CREATININE 1.55* 1.47* 1.90* 1.76*  CALCIUM 9.3 9.5 9.0 9.1  PROT 6.8  --    --   --   ALBUMIN 4.0  --   --   --   AST 26  --   --   --   ALT 15  --   --   --   ALKPHOS 66  --   --   --   BILITOT 0.3  --   --   --   GFRNONAA 50* 53* 39* 43*  ANIONGAP 8 9 8 11     Hematology Recent Labs  Lab 01/03/23 0041 01/04/23 0014 01/05/23 0302  WBC 6.9 5.7 6.0  RBC 5.15 4.88 4.56  HGB 13.9 13.5 12.4*  HCT 46.1 43.8 41.1  MCV 89.5 89.8 90.1  MCH 27.0 27.7 27.2  MCHC 30.2 30.8 30.2  RDW 13.7 13.8 13.7  PLT 132* 132* 119*   BNP Recent Labs  Lab 01/02/23 1356  BNP 543.6*    DDimer  Recent Labs  Lab 01/02/23 1356  DDIMER 0.27     Radiology  US Renal Transplant w/Doppler  Result Date: 01/03/2023 CLINICAL DATA:  History of hypertension EXAM: ULTRASOUND OF RENAL TRANSPLANT WITH RENAL DOPPLER ULTRASOUND TECHNIQUE: Ultrasound examination of the renal transplant was performed with gray-scale, color and duplex doppler evaluation. COMPARISON:  None Available. FINDINGS: Transplant kidney location: Right lower quadrant Transplant Kidney: Renal measurements: 9.9 x 5.8 x 5.6 cm. = volume: . Normal in size and parenchymal echogenicity. No evidence of mass or hydronephrosis. No peri-transplant fluid collection seen. Color flow in the main renal artery:  Present Color flow in the main renal vein:  Present Duplex Doppler Evaluation: Main Renal Artery Velocity: 131 cm/sec Main Renal Artery Resistive Index: 0.8 Venous waveform in main renal vein:  Present Intrarenal resistive index in upper pole:  0.7 (normal 0.6-0.8; equivocal 0.8-0.9; abnormal >= 0.9) Intrarenal resistive index in lower pole: 0.7 (normal 0.6-0.8; equivocal 0.8-0.9; abnormal >= 0.9) Bladder: Normal for degree of bladder distention. Other findings:  None. IMPRESSION: Unremarkable right lower quadrant renal transplant. Resistive indices are within normal limits. No mass or hydronephrosis is noted. Electronically Signed   By: Alcide Clever M.D.   On: 01/03/2023 19:55   ECHOCARDIOGRAM COMPLETE  Result Date:  01/03/2023    ECHOCARDIOGRAM REPORT   Patient Name:   Jon [name] Date of Exam: 01/03/2023 Medical Rec #:  [id number]   Height:  72.0 in Accession #:    [id number]  Weight:       228.0 lb Date of Birth:  1955/11/21   BSA:          2.253 m Patient Age:    66 years    BP:           153/91 mmHg Patient Gender: M           HR:           66 bpm. Exam Location:  Inpatient Procedure: 2D Echo, Cardiac Doppler, Color Doppler and Strain Analysis Indications:    Atrila Fibrillation  History:        Patient has prior history of Echocardiogram examinations, most                 recent 08/08/2017. Previous Myocardial Infarction and CAD,                 Arrythmias:Atrial Fibrillation, Signs/Symptoms:Chest Pain; Risk                 Factors:Hypertension, Hx kidney transplant 2018 and                 Dyslipidemia.  Sonographer:    Wallie Char Referring Phys: [id number] MIR M [facility]  Sonographer Comments: Image acquisition challenging due to patient body habitus. Global longitudinal strain was attempted. IMPRESSIONS  1. Severe LVH with abnormal GLS suggest further w/u for amyloid or infiltrative disease. Left ventricular ejection fraction, by estimation, is 60 to 65%. The left ventricle has normal function. The left ventricle has no regional wall motion abnormalities. There is severe left ventricular hypertrophy. Left ventricular diastolic parameters are consistent with Grade I diastolic dysfunction (impaired relaxation). Elevated left ventricular end-diastolic pressure. The average left ventricular global longitudinal strain is -8.2 %. The global longitudinal strain is abnormal.  2. Right ventricular systolic function is normal. The right ventricular size is normal.  3. Left atrial size was moderately dilated.  4. The mitral valve is abnormal. No evidence of mitral valve regurgitation. No evidence of mitral stenosis. Moderate mitral annular calcification.  5. The aortic valve is tricuspid. There is mild calcification of the  aortic valve. There is mild thickening of the aortic valve. Aortic valve regurgitation is not visualized. Aortic valve sclerosis is present, with no evidence of aortic valve stenosis.  6. Aortic dilatation noted. There is moderate dilatation of the aortic root, measuring 41 mm. There is mild dilatation of the ascending aorta, measuring 39 mm.  7. The inferior vena cava is normal in size with greater than 50% respiratory variability, suggesting right atrial pressure of 3 mmHg. FINDINGS  Left Ventricle: Severe LVH with abnormal GLS suggest further w/u for amyloid or infiltrative disease. Left ventricular ejection fraction, by estimation, is 60 to 65%. The left ventricle has normal function. The left ventricle has no regional wall motion  abnormalities. The average left ventricular global longitudinal strain is -8.2 %. The global longitudinal strain is abnormal. The left ventricular internal cavity size was normal in size. There is severe left ventricular hypertrophy. Left ventricular diastolic parameters are consistent with Grade I diastolic dysfunction (impaired relaxation). Elevated left ventricular end-diastolic pressure. Right Ventricle: The right ventricular size is normal. No increase in right ventricular wall thickness. Right ventricular systolic function is normal. Left Atrium: Left atrial size was moderately dilated. Right Atrium: Right atrial size was normal in size. Pericardium: There is no evidence of pericardial effusion. Mitral Valve: The mitral valve is abnormal. There is  mild thickening of the mitral valve leaflet(s). There is mild calcification of the mitral valve leaflet(s). Moderate mitral annular calcification. No evidence of mitral valve regurgitation. No evidence  of mitral valve stenosis. MV peak gradient, 2.9 mmHg. The mean mitral valve gradient is 1.0 mmHg. Tricuspid Valve: The tricuspid valve is normal in structure. Tricuspid valve regurgitation is not demonstrated. No evidence of tricuspid  stenosis. Aortic Valve: The aortic valve is tricuspid. There is mild calcification of the aortic valve. There is mild thickening of the aortic valve. Aortic valve regurgitation is not visualized. Aortic valve sclerosis is present, with no evidence of aortic valve stenosis. Aortic valve mean gradient measures 5.5 mmHg. Aortic valve peak gradient measures 11.1 mmHg. Aortic valve area, by VTI measures 2.71 cm. Pulmonic Valve: The pulmonic valve was normal in structure. Pulmonic valve regurgitation is not visualized. No evidence of pulmonic stenosis. Aorta: Aortic dilatation noted. There is moderate dilatation of the aortic root, measuring 41 mm. There is mild dilatation of the ascending aorta, measuring 39 mm. Venous: The inferior vena cava is normal in size with greater than 50% respiratory variability, suggesting right atrial pressure of 3 mmHg. IAS/Shunts: No atrial level shunt detected by color flow Doppler.  LEFT VENTRICLE PLAX 2D LVIDd:         5.70 cm      Diastology LVIDs:         3.90 cm      LV e' medial:    4.18 cm/s LV PW:         1.60 cm      LV E/e' medial:  15.7 LV IVS:        1.60 cm      LV e' lateral:   2.79 cm/s LVOT diam:     2.20 cm      LV E/e' lateral: 23.6 LV SV:         85 LV SV Index:   38           2D Longitudinal Strain LVOT Area:     3.80 cm     2D Strain GLS Avg:     -8.2 %  LV Volumes (MOD) LV vol d, MOD A2C: 89.1 ml LV vol d, MOD A4C: 161.0 ml LV vol s, MOD A2C: 37.4 ml LV vol s, MOD A4C: 70.5 ml LV SV MOD A2C:     51.7 ml LV SV MOD A4C:     161.0 ml LV SV MOD BP:      68.8 ml RIGHT VENTRICLE             IVC RV S prime:     16.30 cm/s  IVC diam: 1.40 cm TAPSE (M-mode): 2.0 cm LEFT ATRIUM             Index        RIGHT ATRIUM           Index LA diam:        4.40 cm 1.95 cm/m   RA Area:     17.00 cm LA Vol (A2C):   67.2 ml 29.83 ml/m  RA Volume:   42.30 ml  18.78 ml/m LA Vol (A4C):   71.7 ml 31.83 ml/m LA Biplane Vol: 69.4 ml 30.81 ml/m  AORTIC VALVE AV Area (Vmax):    2.77 cm AV  Area (Vmean):   2.86 cm AV Area (VTI):     2.71 cm AV Vmax:           166.50 cm/s AV  Vmean:          108.500 cm/s AV VTI:            0.314 m AV Peak Grad:      11.1 mmHg AV Mean Grad:      5.5 mmHg LVOT Vmax:         121.50 cm/s LVOT Vmean:        81.500 cm/s LVOT VTI:          0.224 m LVOT/AV VTI ratio: 0.71  AORTA Ao Root diam: 4.10 cm Ao Asc diam:  3.90 cm MITRAL VALVE MV Area (PHT): 3.56 cm    SHUNTS MV Area VTI:   4.05 cm    Systemic VTI:  0.22 m MV Peak grad:  2.9 mmHg    Systemic Diam: 2.20 cm MV Mean grad:  1.0 mmHg MV Vmax:       0.86 m/s MV Vmean:      53.7 cm/s MV Decel Time: 213 msec MV E velocity: 65.80 cm/s MV A velocity: 96.50 cm/s MV E/A ratio:  0.68 Charlton Haws MD Electronically signed by Charlton Haws MD Signature Date/Time: 01/03/2023/3:37:58 PM    Final     Cardiac Studies  Left heart cath 2018 at [facility] with luminal irregularities   TTE 01/03/2023  1. Severe LVH with abnormal GLS suggest further w/u for amyloid or  infiltrative disease. Left ventricular ejection fraction, by estimation,  is 60 to 65%. The left ventricle has normal function. The left ventricle  has no regional wall motion  abnormalities. There is severe left ventricular hypertrophy. Left  ventricular diastolic parameters are consistent with Grade I diastolic  dysfunction (impaired relaxation). Elevated left ventricular end-diastolic  pressure. The average left ventricular  global longitudinal strain is -8.2 %. The global longitudinal strain is  abnormal.   2. Right ventricular systolic function is normal. The right ventricular  size is normal.   3. Left atrial size was moderately dilated.   4. The mitral valve is abnormal. No evidence of mitral valve  regurgitation. No evidence of mitral stenosis. Moderate mitral annular  calcification.   5. The aortic valve is tricuspid. There is mild calcification of the  aortic valve. There is mild thickening of the aortic valve. Aortic valve  regurgitation  is not visualized. Aortic valve sclerosis is present, with  no evidence of aortic valve stenosis.   6. Aortic dilatation noted. There is moderate dilatation of the aortic  root, measuring 41 mm. There is mild dilatation of the ascending aorta,  measuring 39 mm.   7. The inferior vena cava is normal in size with greater than 50%  respiratory variability, suggesting right atrial pressure of 3 mmHg.   Patient Profile  67 year old male with history of ESRD status post renal transplant, uncontrolled hypertension, hyperlipidemia, obesity who was admitted on 01/02/2023 with chest pain, new onset A-fib and elevated troponin.   Assessment & Plan   # Chest pain # Shingles with postherpetic neuralgia -Admitted with hypertensive crisis.  Troponins minimally elevated and flat.  Echo with preserved function and no wall motion abnormality.  Severe LVH likely secondary to hypertensive heart disease. -Suspect troponin elevation is secondary to accelerated hypertension.  Trending down.  Much improved. -Still has right-sided chest discomfort that is palpable.  I suspect this is related to shingles infection and neuralgia. -No plans for invasive angiography this admission.  Left heart cath in 2018 at [facility] with luminal irregularities.  # Hypertensive crisis # Demand ischemia #  Severe LVH, suspect hypertensive heart disease -Suspect his troponin elevation is secondary to microvascular dysfunction in the setting of severe LVH.  Chest pain is improving and likely musculoskeletal. -He has completed 48 hours of heparin.  Echo with normal function and no wall motion abnormality. -He has been treated medically. -Continue carvedilol 25 mg twice daily, hydralazine 100 mg 3 times daily, Imdur 60 mg daily, amlodipine 10 mg daily.  Can titrate up further for better control.  If kidney function continues could consider Entresto.  Also clonidine could be considered. -Continue aspirin 81 mg daily.  See  discussion on A-fib below.  Transition to Eliquis. -Low suspicion for cardiac amyloidosis.  Can consider further workup for this as an outpatient however hypertension is a very likely explanation.  # PVCs -Asymptomatic.  Normal LV function.  No strong indication to treat.  Continue with beta-blocker as above.  # New onset A-fib -Maintaining sinus rhythm.  Suspect this was driven by hypertension.  Continue beta-blocker.  Echo normal.  No need for rhythm control medications. -CHA2DS2-VASc is 2-3 (CAD, hypertension, age we will be 32 in a few months).  Transition to Eliquis 5 mg twice daily.  # AKI # Renal transplant -Improving.  Related to hypertension.  [facility] will sign off.   Medication Recommendations: As above Other recommendations (labs, testing, etc): None Follow up as an outpatient: I will arrange outpatient follow-up in 2 to 4 weeks with me  For questions or updates, please contact [facility] Please consult [email or web address] for contact info under        Signed, Gerri Spore T. Flora Lipps, MD, [facility]  [facility]  01/05/2023 7:53 AM

## 2023-01-05 NOTE — Progress Notes (Signed)
ANTICOAGULATION CONSULT NOTE - Follow Up Consult  Pharmacy Consult for Heparin Indication: atrial fibrillation and NSTEMI  No Known Allergies  Patient Measurements: Height: 6' (182.9 cm) Weight: 103.4 kg (228 lb) IBW/kg (Calculated) : 77.6 Heparin Dosing Weight: 99 kg  Vital Signs: Temp: 97.8 F (36.6 C) (06/08 0000) Temp Source: Oral (06/08 0000) BP: 154/89 (06/07 2100) Pulse Rate: 68 (06/07 2100)  Labs: Recent Labs    01/02/23 1712 01/02/23 1843 01/03/23 0041 01/03/23 0631 01/03/23 0641 01/03/23 1224 01/03/23 1913 01/04/23 0014 01/05/23 0302  HGB  --   --  13.9  --   --   --   --  13.5 12.4*  HCT  --   --  46.1  --   --   --   --  43.8 41.1  PLT  --   --  132*  --   --   --   --  132* 119*  APTT 37*  --   --   --   --   --   --   --   --   LABPROT 14.9  --   --   --   --   --   --   --   --   INR 1.2  --   --   --   --   --   --   --   --   HEPARINUNFRC  --    < > 0.46  --  0.43  --   --  0.30 0.24*  CREATININE  --   --  1.47*  --   --   --   --  1.90* 1.76*  TROPONINIHS 106*   < >  --    < >  --  108* 68* 80*  --    < > = values in this interval not displayed.     Estimated Creatinine Clearance: 52.7 mL/min (A) (by C-G formula based on SCr of 1.76 mg/dL (H)).   Medications:  Infusions:   heparin 1,350 Units/hr (01/05/23 0111)    Assessment: 71 yoM admitted on 6/5 with concern for NSTEMI and new onset afib. Pharmacy consulted to manage heparin. No noted prior to admission anticoagulation. Cardiology following - suspect noncardiac chest pain. Plan for eventual transition to oral anticoagulation once certain patient will not need any procedures.   Today, 01/05/2023: Heparin level 0.24, now sub-therapeutic on heparin 1350 units/hr,  CBC:  Hgb now <13, Plt remains low/stable No bleeding or complications reported by RN  Goal of Therapy:  Heparin level 0.3-0.7 units/ml Monitor platelets by anticoagulation protocol: Yes   Plan:  -Give heparin 1500 unit IV  bolus then increase heparin infusion to 1500 units/hr -Recheck heparin level in 6h -Daily heparin level and CBC -Follow up transition to oral anticoagulation   Junita Push, PharmD, BCPS Clinical Pharmacist 01/05/2023 5:01 AM

## 2023-01-05 NOTE — Progress Notes (Signed)
Progress Note    Jon [name]   ZOX:[id number]  DOB: Jun 25, 1956  DOA: 01/02/2023     3 PCP: Loura Back, NP  Initial CC: CP  Hospital Course: Jon [name] is a 67 yo male with PMH CAD, kidney transplant 2018, CKD3a, HLD, HTN, GERD who presented with chest pain.  He was woken from sleep with the pain and presented for further evaluation to the ER.  Pain was described as burning in nature and located along his sternum.  Blood pressure was uncontrolled on workup and he required multiple agents for control.  Troponin was also elevated and trended.  EKG showed T wave inversions.  Cardiology was consulted and patient was placed on heparin drip. Blood pressure was persistently elevated and he was ultimately started on a nitroglycerin drip.  Some of his chest pain was considered related to recent shingles with residual hypersensitivity/tenderness.  Etiology of his troponin leak and EKG changes was considered due to excessively elevated blood pressure.  He was not felt to warrant catheterization.  Interval History:  No events overnight.  Some improvement in shingles pain after gabapentin started yesterday.  No other changes in his chest pains.  Blood pressure better controlled.  Resting in bed comfortably when seen.  Assessment and Plan: * Elevated troponin - differential includes demand ischemia 2/2 uncontrolled HTN vs NSTEMI - cardiology following as well; etiology believed to be due to uncontrolled HTN (now improved) - on heparin drip with plans to transition to DOAC for afib as noted - echo obtained and reviewed by cardiology as well -Troponins peaked at 124 and have since down trended  Paroxysmal atrial fibrillation (HCC) - New finding on admission - Initiated on heparin drip due to elevated troponin; transition to Eliquis on 6/8 - Continue on telemetry - TSH normal  Acute renal failure superimposed on stage 3a chronic kidney disease (HCC) - creat at baseline on admission (1.5) and has  uptrended some today, 1.9. May be due to uncontrolled BP on admission vs the rapid normalization  - BP now recovering - for now, trend BMP (normal renal doppler)  Kidney transplant recipient - Continue mycophenolate, prednisone, tacrolimus - Appreciate nephrology evaluation.  Not felt to have blood pressure changes related to tacrolimus - Blood pressure regimen has been modified, see separate problem  HTN (hypertension) - Uncontrolled on admission - Home regimen includes amlodipine, Coreg, hydralazine - regimen has been modified; now off NTG drip - regimen to be modified as needed; currently nephrology and cardiology managing  Shingles - Recent episode of shingles, still with tenderness along right chest which may also be contributing to his "chest pain" - Low-dose gabapentin started for trial  Thrombocytopenia (HCC) - baseline around 115k-120k  Anemia due to chronic kidney disease - Hemoglobin normal range.  Baseline 13 to 14 g/dL   Old records reviewed in assessment of this patient  Antimicrobials:   DVT prophylaxis:  SCDs Start: 01/02/23 1651 apixaban (ELIQUIS) tablet 5 mg   Code Status:   Code Status: Full Code  Mobility Assessment (last 72 hours)     Mobility Assessment   No documentation.           Barriers to discharge:  Disposition Plan:  Home Status is: Inpt  Objective: Blood pressure (!) 165/92, pulse 67, temperature 98.2 F (36.8 C), temperature source Oral, resp. rate 16, height 6' (1.829 m), weight 103.4 kg, SpO2 95 %.  Examination:  Physical Exam Constitutional:      General: He is not in acute distress.  Appearance: Normal appearance.  HENT:     Head: Normocephalic and atraumatic.     Mouth/Throat:     Mouth: Mucous membranes are moist.  Eyes:     Extraocular Movements: Extraocular movements intact.  Cardiovascular:     Rate and Rhythm: Normal rate and regular rhythm.  Pulmonary:     Effort: Pulmonary effort is normal. No  respiratory distress.     Breath sounds: Normal breath sounds. No wheezing.  Abdominal:     General: Bowel sounds are normal. There is no distension.     Palpations: Abdomen is soft.     Tenderness: There is no abdominal tenderness.  Musculoskeletal:        General: Normal range of motion.     Cervical back: Normal range of motion and neck supple.  Skin:    General: Skin is warm and dry.  Neurological:     General: No focal deficit present.     Mental Status: He is alert.  Psychiatric:        Mood and Affect: Mood normal.        Behavior: Behavior normal.      Consultants:  Cardiology Nephrology  Procedures:    Data Reviewed: Results for orders placed or performed during the hospital encounter of 01/02/23 (from the past 24 hour(s))  CBC     Status: Abnormal   Collection Time: 01/05/23  3:02 AM  Result Value Ref Range   WBC 6.0 4.0 - 10.5 K/uL   RBC 4.56 4.22 - 5.81 MIL/uL   Hemoglobin 12.4 (L) 13.0 - 17.0 g/dL   HCT 40.9 81.1 - 91.4 %   MCV 90.1 80.0 - 100.0 fL   MCH 27.2 26.0 - 34.0 pg   MCHC 30.2 30.0 - 36.0 g/dL   RDW 78.2 95.6 - 21.3 %   Platelets 119 (L) 150 - 400 K/uL   nRBC 0.0 0.0 - 0.2 %  Heparin level (unfractionated)     Status: Abnormal   Collection Time: 01/05/23  3:02 AM  Result Value Ref Range   Heparin Unfractionated 0.24 (L) 0.30 - 0.70 IU/mL  Basic metabolic panel     Status: Abnormal   Collection Time: 01/05/23  3:02 AM  Result Value Ref Range   Sodium 136 135 - 145 mmol/L   Potassium 3.9 3.5 - 5.1 mmol/L   Chloride 104 98 - 111 mmol/L   CO2 21 (L) 22 - 32 mmol/L   Glucose, Bld 116 (H) 70 - 99 mg/dL   BUN 25 (H) 8 - 23 mg/dL   Creatinine, Ser 0.86 (H) 0.61 - 1.24 mg/dL   Calcium 9.1 8.9 - 57.8 mg/dL   GFR, Estimated 43 (L) >60 mL/min   Anion gap 11 5 - 15  Magnesium     Status: None   Collection Time: 01/05/23  3:02 AM  Result Value Ref Range   Magnesium 1.8 1.7 - 2.4 mg/dL    I have reviewed pertinent nursing notes, vitals, labs, and  images as necessary. I have ordered labwork to follow up on as indicated.  I have reviewed the last notes from staff over past 24 hours. I have discussed patient's care plan and test results with nursing staff, CM/SW, and other staff as appropriate.  Time spent: Greater than 50% of the 55 minute visit was spent in counseling/coordination of care for the patient as laid out in the A&P.   LOS: 3 days   Lewie Chamber, MD [facility] 01/05/2023, 2:31 PM

## 2023-01-05 NOTE — Progress Notes (Signed)
[facility] Progress Note  Subjective: BP's normal to borderline high. 150-160s/ 80s-90s.   Vitals:   01/05/23 0300 01/05/23 0400 01/05/23 0500 01/05/23 0600  BP: 136/78 125/77 (!) 173/95 130/83  Pulse: 77 65 68 63  Resp: 13 15 18 13   Temp:  97.8 F (36.6 C)    TempSrc:  Oral    SpO2: 94% 94% 95% 93%  Weight:      Height:        Exam: Gen: NAD CVS: RRR Resp: CTA Abd: +BS, soft, NT/nD Ext: no edema, right forearm AVF +T/B    Assessment/Plan AKI/CKD stage IIIa transplant - f/b Dr Marisue Humble at [facility], B/l creatinine is 1.2- 1.5. Creat was 1.5 on admission in setting of HTN'sive urgency. AKI was felt to be due to over-aggressive bp control acutely.  SBP had dropped into the 90's so IV NTG was stopped. BP's improved and SCr peaked at 1.9 yest and is down today to 1.7.  Acutely shoot for SBP goal of 110- 150 range for now, w/ long-term BP goal < 130/ 80. BP's are better yest/ today and creat is down. Cont po BP meds. F/u creat in am.  Hypertensive emergency - over-corrected as above, is now off NTG. Resumed home bp meds. Do not believe this was related to tacrolimus given recent therapeutic level of 4.8. Cont po BP meds, goal as above. BP's much better.  Chest pain - Cardiology consulted and suspects that it is musculoskeletal as well as postherpetic neuralgia from shingles.  Troponins trending down. New onset atrial fib - on heparin per cardiology.  Back in sinus rhythm. DDKT - baseline SCr around 1.5.  Continue with tacrolimus 3 mg qam and 2 mg qpm, cellcept 1000 mg bid and prednisone 5 qd.  TAC level therapeutic at 4.8 last week.  Bactrim MWF for PCP prophylaxis. No changes necessary. HTN/severe LVH - Cardiology following.   Vinson Moselle MD [facility] 01/05/2023, 8:25 AM  Recent Labs  Lab 01/02/23 1204 01/03/23 0041 01/04/23 0014 01/05/23 0302  HGB 13.1   < > 13.5 12.4*  ALBUMIN 4.0  --   --   --   CALCIUM 9.3   < > 9.0 9.1  PHOS  --   --  3.8  --   CREATININE 1.55*   <  > 1.90* 1.76*  K 3.4*   < > 3.6 3.9   < > = values in this interval not displayed.   No results for input(s): "IRON", "TIBC", "FERRITIN" in the last 168 hours. Inpatient medications:  amLODipine  10 mg Oral Daily   apixaban  5 mg Oral BID   aspirin  81 mg Oral Daily   atorvastatin  80 mg Oral q1800   carvedilol  25 mg Oral BID WC   Chlorhexidine Gluconate Cloth  6 each Topical Daily   gabapentin  100 mg Oral TID   hydrALAZINE  100 mg Oral Q8H   isosorbide mononitrate  60 mg Oral Daily   latanoprost  1 drop Both Eyes QHS   mycophenolate  1,000 mg Oral BID   OXcarbazepine  300 mg Oral BID   polyethylene glycol  17 g Oral Daily   predniSONE  5 mg Oral Q breakfast   senna-docusate  1 tablet Oral BID   tacrolimus  2 mg Oral QHS   tacrolimus  3 mg Oral Daily    acetaminophen **OR** acetaminophen, hydrALAZINE, labetalol, morphine injection, nitroGLYCERIN, ondansetron **OR** ondansetron (ZOFRAN) IV, mouth rinse, pneumococcal 20-valent conjugate vaccine

## 2023-01-06 DIAGNOSIS — R7989 Other specified abnormal findings of blood chemistry: Secondary | ICD-10-CM | POA: Diagnosis not present

## 2023-01-06 DIAGNOSIS — N179 Acute kidney failure, unspecified: Secondary | ICD-10-CM | POA: Diagnosis not present

## 2023-01-06 DIAGNOSIS — I480 Paroxysmal atrial fibrillation: Secondary | ICD-10-CM | POA: Diagnosis not present

## 2023-01-06 DIAGNOSIS — Z940 Kidney transplant status: Secondary | ICD-10-CM | POA: Diagnosis not present

## 2023-01-06 LAB — BASIC METABOLIC PANEL
Anion gap: 9 (ref 5–15)
BUN: 19 mg/dL (ref 8–23)
CO2: 20 mmol/L — ABNORMAL LOW (ref 22–32)
Calcium: 9.1 mg/dL (ref 8.9–10.3)
Chloride: 109 mmol/L (ref 98–111)
Creatinine, Ser: 1.42 mg/dL — ABNORMAL HIGH (ref 0.61–1.24)
GFR, Estimated: 55 mL/min — ABNORMAL LOW (ref >60)
Glucose, Bld: 127 mg/dL — ABNORMAL HIGH (ref 70–99)
Potassium: 3.5 mmol/L (ref 3.5–5.1)
Sodium: 138 mmol/L (ref 135–145)

## 2023-01-06 LAB — MAGNESIUM: Magnesium: 1.6 mg/dL — ABNORMAL LOW (ref 1.7–2.4)

## 2023-01-06 MED ORDER — ISOSORBIDE MONONITRATE ER 60 MG PO TB24: 60.0000 mg | ORAL_TABLET | Freq: Every day | ORAL | 3 refills | Status: DC

## 2023-01-06 MED ORDER — APIXABAN 5 MG PO TABS: 5.0000 mg | ORAL_TABLET | Freq: Two times a day (BID) | ORAL | 3 refills | Status: DC

## 2023-01-06 MED ORDER — BRIMONIDINE TARTRATE 0.2 % OP SOLN
1.0000 [drp] | Freq: Two times a day (BID) | OPHTHALMIC | Status: DC
  Administered 2023-01-06: 1 [drp] via OPHTHALMIC
  Filled 2023-01-06: qty 5

## 2023-01-06 MED ORDER — MAGNESIUM SULFATE 2 GM/50ML IV SOLN
2.0000 g | Freq: Once | INTRAVENOUS | Status: AC
  Administered 2023-01-06: 2 g via INTRAVENOUS
  Filled 2023-01-06: qty 50

## 2023-01-06 MED ORDER — BRIMONIDINE TARTRATE-TIMOLOL 0.2-0.5 % OP SOLN: 1.0000 [drp] | Freq: Two times a day (BID) | OPHTHALMIC | Status: DC

## 2023-01-06 MED ORDER — GABAPENTIN 100 MG PO CAPS: 100.0000 mg | ORAL_CAPSULE | Freq: Three times a day (TID) | ORAL | 3 refills | Status: DC

## 2023-01-06 MED ORDER — TIMOLOL MALEATE 0.5 % OP SOLN
1.0000 [drp] | Freq: Two times a day (BID) | OPHTHALMIC | Status: DC
  Administered 2023-01-06: 1 [drp] via OPHTHALMIC
  Filled 2023-01-06: qty 5

## 2023-01-06 MED ORDER — HYDRALAZINE HCL 100 MG PO TABS: 100.0000 mg | ORAL_TABLET | Freq: Three times a day (TID) | ORAL | 3 refills | Status: DC

## 2023-01-06 NOTE — Discharge Summary (Signed)
Physician Discharge Summary   [name] YNW:[id number] DOB: [birth year] DOA: 01/02/2023  PCP: Loura Back, NP  Admit date: 01/02/2023 Discharge date: 01/06/2023   Admitted From: Home Disposition:  Home Discharging physician: Lewie Chamber, MD Barriers to discharge: none  Recommendations at discharge: Repeat BMP Followup with cardiology    Discharge Condition: stable CODE STATUS: Full Diet recommendation:  Diet Orders (From admission, onward)     Start     Ordered   01/06/23 0000  Diet - low sodium heart healthy        01/06/23 1042   01/03/23 1406  Diet Heart Room service appropriate? Yes; Fluid consistency: Thin  Diet effective now       Question Answer Comment  Room service appropriate? Yes   Fluid consistency: Thin      01/03/23 1405            Hospital Course: [name] is a 67 yo male with PMH CAD, kidney transplant 2018, CKD3a, HLD, HTN, GERD who presented with chest pain.  He was woken from sleep with the pain and presented for further evaluation to the ER.  Pain was described as burning in nature and located along his sternum.  Blood pressure was uncontrolled on workup and he required multiple agents for control.  Troponin was also elevated and trended.  EKG showed T wave inversions.  Cardiology was consulted and patient was placed on heparin drip. Blood pressure was persistently elevated and he was ultimately started on a nitroglycerin drip.  Some of his chest pain was considered related to recent shingles with residual hypersensitivity/tenderness.  Etiology of his troponin leak and EKG changes was considered due to excessively elevated blood pressure.  He was not felt to warrant catheterization.  Assessment and Plan: * Elevated troponin-resolved as of 01/06/2023 - differential includes demand ischemia 2/2 uncontrolled HTN vs NSTEMI - cardiology following as well; etiology believed to be due to uncontrolled HTN (now improved) - on heparin drip with plans to  transition to DOAC for afib as noted - echo obtained and reviewed by cardiology as well -Troponins peaked at 124 and have since down trended  Paroxysmal atrial fibrillation (HCC) - New finding on admission - Initiated on heparin drip due to elevated troponin; transition to Eliquis on 6/8 - TSH normal  Acute renal failure superimposed on stage 3a chronic kidney disease (HCC) - creat at baseline on admission (1.5) and has uptrended some today, 1.9. May be due to uncontrolled BP on admission vs the rapid normalization  - BP now recovering - for now, trend BMP (normal renal doppler) -Creatinine stable and improved to 1.42 at discharge  Kidney transplant recipient - Continue mycophenolate, prednisone, tacrolimus - Appreciate nephrology evaluation.  Not felt to have blood pressure changes related to tacrolimus - Blood pressure regimen has been modified, see separate problem  HTN (hypertension) - Uncontrolled on admission - Home regimen includes amlodipine, Coreg, hydralazine - regimen has been modified; now off NTG drip  History of shingles - episode of shingles about 1 year ago but patient has residual tenderness along right chest which may also be contributing to his "chest pain" - Low-dose gabapentin started for trial  Thrombocytopenia (HCC) - baseline around 115k-120k  Anemia due to chronic kidney disease - Hemoglobin normal range.  Baseline 13 to 14 g/dL   The patient's chronic medical conditions were treated accordingly per the patient's home medication regimen except as noted.  On day of discharge, patient was felt deemed stable for discharge. Patient/family member  advised to call PCP or come back to ER if needed.   Principal Diagnosis: Elevated troponin  Discharge Diagnoses: Active Hospital Problems   Diagnosis Date Noted   Acute renal failure superimposed on stage 3a chronic kidney disease (HCC) 01/03/2023    Priority: 2.   Paroxysmal atrial fibrillation (HCC)  01/03/2023    Priority: 2.   Kidney transplant recipient 08/26/2017    Priority: 2.   HTN (hypertension) 05/07/2012    Priority: 3.   History of shingles 01/04/2023   Anemia due to chronic kidney disease    Thrombocytopenia [facility])     Resolved Hospital Problems   Diagnosis Date Noted Date Resolved   Elevated troponin 01/02/2023 01/06/2023    Priority: 1.     Discharge Instructions     Diet - low sodium heart healthy   Complete by: As directed    Increase activity slowly   Complete by: As directed       Allergies as of 01/06/2023   No Known Allergies      Medication List     STOP taking these medications    aspirin EC 81 MG tablet       TAKE these medications    albuterol 108 (90 Base) MCG/ACT inhaler Commonly known as: VENTOLIN HFA Inhale 2 puffs into the lungs every 6 (six) hours as needed for wheezing or shortness of breath.   amLODipine 10 MG tablet Commonly known as: NORVASC Take 10 mg by mouth at bedtime.   apixaban 5 MG Tabs tablet Commonly known as: ELIQUIS Take 1 tablet (5 mg total) by mouth 2 (two) times daily.   atorvastatin 20 MG tablet Commonly known as: LIPITOR Take 20 mg by mouth at bedtime.   brimonidine-timolol 0.2-0.5 % ophthalmic solution Commonly known as: COMBIGAN Place 1 drop into both eyes every 12 (twelve) hours.   carvedilol 25 MG tablet Commonly known as: COREG Take 1 tablet (25 mg total) by mouth 2 (two) times daily with a meal.   Diclofenac Sodium 1 % Crea 1gram qid prn What changed:  how much to take how to take this when to take this reasons to take this additional instructions   gabapentin 100 MG capsule Commonly known as: NEURONTIN Take 1 capsule (100 mg total) by mouth 3 (three) times daily.   hydrALAZINE 100 MG tablet Commonly known as: APRESOLINE Take 1 tablet (100 mg total) by mouth every 8 (eight) hours. What changed:  medication strength how much to take when to take this   isosorbide mononitrate  60 MG 24 hr tablet Commonly known as: IMDUR Take 1 tablet (60 mg total) by mouth daily. Start taking on: January 07, 2023   lidocaine-prilocaine cream Commonly known as: EMLA Apply 1 Application topically as needed. 1 gram qid prn What changed:  how much to take when to take this reasons to take this additional instructions   magnesium oxide 400 MG tablet Commonly known as: MAG-OX Take 800 mg by mouth See admin instructions. Take 800 mg by mouth two times a day- 2 hours before or after Cellcept   mycophenolate 200 MG/ML suspension Commonly known as: CELLCEPT Take 1,000 mg by mouth 2 (two) times a day.   OXcarbazepine 300 MG/5ML suspension Commonly known as: Trileptal Take 5 mLs (300 mg total) by mouth 2 (two) times daily.   Phos-NaK 280-160-250 MG Pack Generic drug: potassium & sodium phosphates Take 2 Packages by mouth 3 (three) times daily.   predniSONE 5 MG/5ML solution Take 5 mg by  mouth daily.   Prograf 1 MG capsule Generic drug: tacrolimus Take 2-3 mg by mouth See admin instructions. Take 3 mg by mouth in the morning and 2 mg at bedtime   sulfamethoxazole-trimethoprim 200-40 MG/5ML suspension Commonly known as: BACTRIM Take 10 mLs by mouth every Monday, Wednesday, and Friday.   Vyzulta 0.024 % Soln Generic drug: Latanoprostene Bunod Place 1 drop into both eyes at bedtime.        Follow-up Information     O'Neal, Ronnald Ramp, MD Follow up on 01/25/2023.   Specialties: Cardiology, Internal Medicine, Radiology Why: 9:30AM. Post hospital follow up Contact information: [address] [place] [address] [phone]                No Known Allergies  Consultations: Cardiology Nephrology   Procedures:   Discharge Exam: BP (!) 144/87 (BP Location: Left Arm)   Pulse 81   Temp 98.4 F (36.9 C) (Oral)   Resp 18   Ht 6' (1.829 m)   Wt 111.4 kg   SpO2 96%   BMI 33.31 kg/m  Physical Exam Constitutional:      General: He is not in  acute distress.    Appearance: Normal appearance.  HENT:     Head: Normocephalic and atraumatic.     Mouth/Throat:     Mouth: Mucous membranes are moist.  Eyes:     Extraocular Movements: Extraocular movements intact.  Cardiovascular:     Rate and Rhythm: Normal rate and regular rhythm.  Pulmonary:     Effort: Pulmonary effort is normal. No respiratory distress.     Breath sounds: Normal breath sounds. No wheezing.  Abdominal:     General: Bowel sounds are normal. There is no distension.     Palpations: Abdomen is soft.     Tenderness: There is no abdominal tenderness.  Musculoskeletal:        General: Normal range of motion.     Cervical back: Normal range of motion and neck supple.  Skin:    General: Skin is warm and dry.  Neurological:     General: No focal deficit present.     Mental Status: He is alert.  Psychiatric:        Mood and Affect: Mood normal.        Behavior: Behavior normal.      The results of significant diagnostics from this hospitalization (including imaging, microbiology, ancillary and laboratory) are listed below for reference.   Microbiology: Recent Results (from the past 240 hour(s))  MRSA Next Gen by PCR, Nasal     Status: None   Collection Time: 01/02/23  6:14 PM   Specimen: Nasal Mucosa; Nasal Swab  Result Value Ref Range Status   MRSA by PCR Next Gen NOT DETECTED NOT DETECTED Final    Comment: (NOTE) The GeneXpert MRSA Assay (FDA approved for NASAL specimens only), is one component of a comprehensive MRSA colonization surveillance program. It is not intended to diagnose MRSA infection nor to guide or monitor treatment for MRSA infections. Test performance is not FDA approved in patients less than 47 years old. Performed at [facility], [address] [place], [place] [address]      Labs: BNP (last 3 results) Recent Labs    01/02/23 1356  BNP 543.6*   Basic Metabolic Panel: Recent Labs  Lab 01/02/23 1156  01/02/23 1204 01/03/23 0041 01/04/23 0014 01/05/23 0302 01/06/23 0542  NA  --  139 139 138 136 138  K  --  3.4* 3.9 3.6 3.9 3.5  CL  --  103 107 105 104 109  CO2  --  28 23 25  21* 20*  GLUCOSE  --  154* 112* 144* 116* 127*  BUN  --  14 12 22  25* 19  CREATININE  --  1.55* 1.47* 1.90* 1.76* 1.42*  CALCIUM  --  9.3 9.5 9.0 9.1 9.1  MG 1.8  --   --  1.8 1.8 1.6*  PHOS  --   --   --  3.8  --   --    Liver Function Tests: Recent Labs  Lab 01/02/23 1204  AST 26  ALT 15  ALKPHOS 66  BILITOT 0.3  PROT 6.8  ALBUMIN 4.0   No results for input(s): "LIPASE", "AMYLASE" in the last 168 hours. No results for input(s): "AMMONIA" in the last 168 hours. CBC: Recent Labs  Lab 01/02/23 1204 01/03/23 0041 01/04/23 0014 01/05/23 0302  WBC 6.0 6.9 5.7 6.0  HGB 13.1 13.9 13.5 12.4*  HCT 42.1 46.1 43.8 41.1  MCV 88.4 89.5 89.8 90.1  PLT 129* 132* 132* 119*   Cardiac Enzymes: No results for input(s): "CKTOTAL", "CKMB", "CKMBINDEX", "TROPONINI" in the last 168 hours. BNP: Invalid input(s): "POCBNP" CBG: No results for input(s): "GLUCAP" in the last 168 hours. D-Dimer No results for input(s): "DDIMER" in the last 72 hours. Hgb A1c No results for input(s): "HGBA1C" in the last 72 hours. Lipid Profile Recent Labs    01/04/23 0014  CHOL 107  HDL 31*  LDLCALC 31  TRIG 829*  CHOLHDL 3.5   Thyroid function studies No results for input(s): "TSH", "T4TOTAL", "T3FREE", "THYROIDAB" in the last 72 hours.  Invalid input(s): "FREET3" Anemia work up No results for input(s): "VITAMINB12", "FOLATE", "FERRITIN", "TIBC", "IRON", "RETICCTPCT" in the last 72 hours. Urinalysis    Component Value Date/Time   COLORURINE YELLOW 01/03/2023 2144   APPEARANCEUR CLEAR 01/03/2023 2144   LABSPEC 1.011 01/03/2023 2144   PHURINE 7.0 01/03/2023 2144   GLUCOSEU NEGATIVE 01/03/2023 2144   HGBUR NEGATIVE 01/03/2023 2144   BILIRUBINUR NEGATIVE 01/03/2023 2144   KETONESUR NEGATIVE 01/03/2023 2144    PROTEINUR NEGATIVE 01/03/2023 2144   UROBILINOGEN 0.2 07/04/2010 0532   NITRITE NEGATIVE 01/03/2023 2144   LEUKOCYTESUR TRACE (A) 01/03/2023 2144   Sepsis Labs Recent Labs  Lab 01/02/23 1204 01/03/23 0041 01/04/23 0014 01/05/23 0302  WBC 6.0 6.9 5.7 6.0   Microbiology Recent Results (from the past 240 hour(s))  MRSA Next Gen by PCR, Nasal     Status: None   Collection Time: 01/02/23  6:14 PM   Specimen: Nasal Mucosa; Nasal Swab  Result Value Ref Range Status   MRSA by PCR Next Gen NOT DETECTED NOT DETECTED Final    Comment: (NOTE) The GeneXpert MRSA Assay (FDA approved for NASAL specimens only), is one component of a comprehensive MRSA colonization surveillance program. It is not intended to diagnose MRSA infection nor to guide or monitor treatment for MRSA infections. Test performance is not FDA approved in patients less than 41 years old. Performed at [facility], [address], [place], [place] [address]     Procedures/Studies: US Renal Transplant w/Doppler  Result Date: 01/03/2023 CLINICAL DATA:  History of hypertension EXAM: ULTRASOUND OF RENAL TRANSPLANT WITH RENAL DOPPLER ULTRASOUND TECHNIQUE: Ultrasound examination of the renal transplant was performed with gray-scale, color and duplex doppler evaluation. COMPARISON:  None Available. FINDINGS: Transplant kidney location: Right lower quadrant Transplant Kidney: Renal measurements: 9.9 x 5.8 x 5.6  cm. = volume: . Normal in size and parenchymal echogenicity. No evidence of mass or hydronephrosis. No peri-transplant fluid collection seen. Color flow in the main renal artery:  Present Color flow in the main renal vein:  Present Duplex Doppler Evaluation: Main Renal Artery Velocity: 131 cm/sec Main Renal Artery Resistive Index: 0.8 Venous waveform in main renal vein:  Present Intrarenal resistive index in upper pole:  0.7 (normal 0.6-0.8; equivocal 0.8-0.9; abnormal >= 0.9) Intrarenal resistive index in  lower pole: 0.7 (normal 0.6-0.8; equivocal 0.8-0.9; abnormal >= 0.9) Bladder: Normal for degree of bladder distention. Other findings:  None. IMPRESSION: Unremarkable right lower quadrant renal transplant. Resistive indices are within normal limits. No mass or hydronephrosis is noted. Electronically Signed   By: Alcide Clever M.D.   On: 01/03/2023 19:55   ECHOCARDIOGRAM COMPLETE  Result Date: 01/03/2023    ECHOCARDIOGRAM REPORT   Patient Name:   Jon [name] Date of Exam: 01/03/2023 Medical Rec #:  [id number]   Height:       72.0 in Accession #:    [id number]  Weight:       228.0 lb Date of Birth:  04-17-1956   BSA:          2.253 m Patient Age:    66 years    BP:           153/91 mmHg Patient Gender: M           HR:           66 bpm. Exam Location:  Inpatient Procedure: 2D Echo, Cardiac Doppler, Color Doppler and Strain Analysis Indications:    Atrila Fibrillation  History:        Patient has prior history of Echocardiogram examinations, most                 recent 08/08/2017. Previous Myocardial Infarction and CAD,                 Arrythmias:Atrial Fibrillation, Signs/Symptoms:Chest Pain; Risk                 Factors:Hypertension, Hx kidney transplant 2018 and                 Dyslipidemia.  Sonographer:    Wallie Char Referring Phys: [id number] MIR M [facility]  Sonographer Comments: Image acquisition challenging due to patient body habitus. Global longitudinal strain was attempted. IMPRESSIONS  1. Severe LVH with abnormal GLS suggest further w/u for amyloid or infiltrative disease. Left ventricular ejection fraction, by estimation, is 60 to 65%. The left ventricle has normal function. The left ventricle has no regional wall motion abnormalities. There is severe left ventricular hypertrophy. Left ventricular diastolic parameters are consistent with Grade I diastolic dysfunction (impaired relaxation). Elevated left ventricular end-diastolic pressure. The average left ventricular global longitudinal strain is -8.2 %.  The global longitudinal strain is abnormal.  2. Right ventricular systolic function is normal. The right ventricular size is normal.  3. Left atrial size was moderately dilated.  4. The mitral valve is abnormal. No evidence of mitral valve regurgitation. No evidence of mitral stenosis. Moderate mitral annular calcification.  5. The aortic valve is tricuspid. There is mild calcification of the aortic valve. There is mild thickening of the aortic valve. Aortic valve regurgitation is not visualized. Aortic valve sclerosis is present, with no evidence of aortic valve stenosis.  6. Aortic dilatation noted. There is moderate dilatation of the aortic root, measuring 41 mm. There is mild dilatation of the ascending  aorta, measuring 39 mm.  7. The inferior vena cava is normal in size with greater than 50% respiratory variability, suggesting right atrial pressure of 3 mmHg. FINDINGS  Left Ventricle: Severe LVH with abnormal GLS suggest further w/u for amyloid or infiltrative disease. Left ventricular ejection fraction, by estimation, is 60 to 65%. The left ventricle has normal function. The left ventricle has no regional wall motion  abnormalities. The average left ventricular global longitudinal strain is -8.2 %. The global longitudinal strain is abnormal. The left ventricular internal cavity size was normal in size. There is severe left ventricular hypertrophy. Left ventricular diastolic parameters are consistent with Grade I diastolic dysfunction (impaired relaxation). Elevated left ventricular end-diastolic pressure. Right Ventricle: The right ventricular size is normal. No increase in right ventricular wall thickness. Right ventricular systolic function is normal. Left Atrium: Left atrial size was moderately dilated. Right Atrium: Right atrial size was normal in size. Pericardium: There is no evidence of pericardial effusion. Mitral Valve: The mitral valve is abnormal. There is mild thickening of the mitral valve  leaflet(s). There is mild calcification of the mitral valve leaflet(s). Moderate mitral annular calcification. No evidence of mitral valve regurgitation. No evidence  of mitral valve stenosis. MV peak gradient, 2.9 mmHg. The mean mitral valve gradient is 1.0 mmHg. Tricuspid Valve: The tricuspid valve is normal in structure. Tricuspid valve regurgitation is not demonstrated. No evidence of tricuspid stenosis. Aortic Valve: The aortic valve is tricuspid. There is mild calcification of the aortic valve. There is mild thickening of the aortic valve. Aortic valve regurgitation is not visualized. Aortic valve sclerosis is present, with no evidence of aortic valve stenosis. Aortic valve mean gradient measures 5.5 mmHg. Aortic valve peak gradient measures 11.1 mmHg. Aortic valve area, by VTI measures 2.71 cm. Pulmonic Valve: The pulmonic valve was normal in structure. Pulmonic valve regurgitation is not visualized. No evidence of pulmonic stenosis. Aorta: Aortic dilatation noted. There is moderate dilatation of the aortic root, measuring 41 mm. There is mild dilatation of the ascending aorta, measuring 39 mm. Venous: The inferior vena cava is normal in size with greater than 50% respiratory variability, suggesting right atrial pressure of 3 mmHg. IAS/Shunts: No atrial level shunt detected by color flow Doppler.  LEFT VENTRICLE PLAX 2D LVIDd:         5.70 cm      Diastology LVIDs:         3.90 cm      LV e' medial:    4.18 cm/s LV PW:         1.60 cm      LV E/e' medial:  15.7 LV IVS:        1.60 cm      LV e' lateral:   2.79 cm/s LVOT diam:     2.20 cm      LV E/e' lateral: 23.6 LV SV:         85 LV SV Index:   38           2D Longitudinal Strain LVOT Area:     3.80 cm     2D Strain GLS Avg:     -8.2 %  LV Volumes (MOD) LV vol d, MOD A2C: 89.1 ml LV vol d, MOD A4C: 161.0 ml LV vol s, MOD A2C: 37.4 ml LV vol s, MOD A4C: 70.5 ml LV SV MOD A2C:     51.7 ml LV SV MOD A4C:     161.0 ml LV SV MOD BP:  68.8 ml RIGHT  VENTRICLE             IVC RV S prime:     16.30 cm/s  IVC diam: 1.40 cm TAPSE (M-mode): 2.0 cm LEFT ATRIUM             Index        RIGHT ATRIUM           Index LA diam:        4.40 cm 1.95 cm/m   RA Area:     17.00 cm LA Vol (A2C):   67.2 ml 29.83 ml/m  RA Volume:   42.30 ml  18.78 ml/m LA Vol (A4C):   71.7 ml 31.83 ml/m LA Biplane Vol: 69.4 ml 30.81 ml/m  AORTIC VALVE AV Area (Vmax):    2.77 cm AV Area (Vmean):   2.86 cm AV Area (VTI):     2.71 cm AV Vmax:           166.50 cm/s AV Vmean:          108.500 cm/s AV VTI:            0.314 m AV Peak Grad:      11.1 mmHg AV Mean Grad:      5.5 mmHg LVOT Vmax:         121.50 cm/s LVOT Vmean:        81.500 cm/s LVOT VTI:          0.224 m LVOT/AV VTI ratio: 0.71  AORTA Ao Root diam: 4.10 cm Ao Asc diam:  3.90 cm MITRAL VALVE MV Area (PHT): 3.56 cm    SHUNTS MV Area VTI:   4.05 cm    Systemic VTI:  0.22 m MV Peak grad:  2.9 mmHg    Systemic Diam: 2.20 cm MV Mean grad:  1.0 mmHg MV Vmax:       0.86 m/s MV Vmean:      53.7 cm/s MV Decel Time: 213 msec MV E velocity: 65.80 cm/s MV A velocity: 96.50 cm/s MV E/A ratio:  0.68 Charlton Haws MD Electronically signed by Charlton Haws MD Signature Date/Time: 01/03/2023/3:37:58 PM    Final    DG Chest 2 View  Result Date: 01/02/2023 CLINICAL DATA:  Shortness of breath EXAM: CHEST - 2 VIEW COMPARISON:  X-ray 07/15/2022 and older FINDINGS: Normal cardiopericardial silhouette. No pneumothorax, effusion or edema. No consolidation. Overlapping cardiac leads. Films are under penetrated. IMPRESSION: No active cardiopulmonary disease. Electronically Signed   By: Karen Kays M.D.   On: 01/02/2023 12:43     Time coordinating discharge: Over 30 minutes    Lewie Chamber, MD  [facility] 01/06/2023, 3:27 PM

## 2023-01-24 NOTE — Progress Notes (Deleted)
Cardiology Office Note:   Date:  01/24/2023  NAME:  Jon [name]    MRN: [id number] DOB:  [birth year]   PCP:  Loura Back, NP  Cardiologist:  None  Electrophysiologist:  None   Referring MD: Loura Back, NP   No chief complaint on file.   History of Present Illness:   Jon [name] is a 67 y.o. male with a hx of ESRD status post renal transplant, hypertension who presents for follow-up.  He was admitted to [facility] in early June for hypertensive crisis and new onset A-fib.  Had minimal troponin elevation secondary to uncontrolled hypertension.  Also suffered a transplant AKI.  He was managed medically.  Echocardiogram with severe LVH.  Pattern does not fit amyloid.  Suspect this is hypertensive heart disease.  Problem List ESRD -s/p renal transplant 2. HTN 3. Paroxysmal Afib -Dx 12/2022 4. HFpEF  Past Medical History: Past Medical History:  Diagnosis Date   Chronic kidney disease    Dialysis patient (HCC)    Dyspnea    GERD (gastroesophageal reflux disease)    History of blood transfusion    Hyperlipidemia    takes Atorvastatin daily   Hypertension    takes Amlodipine,Losartan,and Labetalol daily   Myocardial infarction [facility])    lived in [place] maybe 7 years ago   Small bowel obstruction (HCC)     Past Surgical History: Past Surgical History:  Procedure Laterality Date   ARTERIOVENOUS GRAFT PLACEMENT     AV FISTULA PLACEMENT Right 10/25/2015   Procedure: ARTERIOVENOUS (AV) FISTULA CREATION RIGHT FOREARM;  Surgeon: Chuck Hint, MD;  Location: [facility] OR;  Service: Vascular;  Laterality: Right;   BOTOX INJECTION N/A 08/07/2021   Procedure: cystoscopy BOTOX INJECTION;  Surgeon: Crista Elliot, MD;  Location: [facility] ORS;  Service: Urology;  Laterality: N/A;   CARDIAC CATHETERIZATION N/A 05/05/2015   Procedure: Left Heart Cath and Coronary Angiography;  Surgeon: Rinaldo Cloud, MD;  Location: [facility] INVASIVE CV LAB;  Service: Cardiovascular;  Laterality: N/A;    LIGATION OF ARTERIOVENOUS  FISTULA Left 01/06/2019   Procedure: EXCISION OF ANEURYSMAL  ARTERIOVENOUS  FISTULA LEFT ARM;  Surgeon: Chuck Hint, MD;  Location: [facility] OR;  Service: Vascular;  Laterality: Left;   PERIPHERAL VASCULAR CATHETERIZATION N/A 08/18/2015   Procedure: Fistulagram;  Surgeon: Fransisco Hertz, MD;  Location: [facility] INVASIVE CV LAB;  Service: Cardiovascular;  Laterality: N/A;   PERIPHERAL VASCULAR CATHETERIZATION Right 04/19/2016   Procedure: Fistulagram;  Surgeon: Fransisco Hertz, MD;  Location: [facility] INVASIVE CV LAB;  Service: Cardiovascular;  Laterality: Right;   RESECTION OF ARTERIOVENOUS FISTULA ANEURYSM Left 02/05/2013   Procedure: REPAIR OF ANEURYSM OF LEFT ARM ARTERIOVENOUS FISTULA ;  Surgeon: Nada Libman, MD;  Location: [facility] OR;  Service: Vascular;  Laterality: Left;   RESECTION OF ARTERIOVENOUS FISTULA ANEURYSM Left 03/08/2015   Procedure: REPAIR OF LEFT ARTERIOVENOUS FISTULA PSEUDOANEURYSM;  Surgeon: Chuck Hint, MD;  Location: [facility] OR;  Service: Vascular;  Laterality: Left;   REVISON OF ARTERIOVENOUS FISTULA Left 06/02/2015   Procedure: PLICATION OF A LARGE ANEURYSM LEFT UPPER ARM BRACHIO-CEPHALIC ARTERIOVENOUS FISTULA ;  Surgeon: Chuck Hint, MD;  Location: [facility] OR;  Service: Vascular;  Laterality: Left;   SMALL INTESTINE SURGERY      Current Medications: No outpatient medications have been marked as taking for the 01/25/23 encounter (Appointment) with Sande Rives, MD.     Allergies:    Patient has no known allergies.   Social History: Social History  Socioeconomic History   Marital status: Married    Spouse name: Not on file   Number of children: 4   Years of education: Not on file   Highest education level: Not on file  Occupational History   Occupation: DISABILITY    Employer: UNEMPLOYED  Tobacco Use   Smoking status: Never   Smokeless tobacco: Never  Vaping Use   Vaping Use: Never used  Substance and Sexual Activity   Alcohol  use: No    Alcohol/week: 0.0 standard drinks of alcohol   Drug use: No   Sexual activity: Not on file  Other Topics Concern   Not on file  Social History Narrative   Not on file   Social Determinants of Health   Financial Resource Strain: Not on file  Food Insecurity: No Food Insecurity (01/02/2023)   Hunger Vital Sign    Worried About Running Out of Food in the Last Year: Never true    Ran Out of Food in the Last Year: Never true  Transportation Needs: No Transportation Needs (01/02/2023)   PRAPARE - Administrator, Civil Service (Medical): No    Lack of Transportation (Non-Medical): No  Physical Activity: Not on file  Stress: Not on file  Social Connections: Patient Declined (01/05/2023)   Social Connection and Isolation Panel [NHANES]    Frequency of Communication with Friends and Family: Patient declined    Frequency of Social Gatherings with Friends and Family: Patient declined    Attends Religious Services: Patient declined    Database administrator or Organizations: Patient declined    Attends Engineer, structural: Patient declined    Marital Status: Patient declined     Family History: The patient's family history includes Aneurysm in his mother; Diabetes in his father; Heart attack in his father; Hypertension in his father, mother, and sister; Other in his brother, mother, and sister.  ROS:   All other ROS reviewed and negative. Pertinent positives noted in the HPI.     EKGs/Labs/Other Studies Reviewed:   The following studies were personally reviewed by me today:  EKG:  EKG is *** ordered today.        TTE 01/03/2023  1. Severe LVH with abnormal GLS suggest further w/u for amyloid or  infiltrative disease. Left ventricular ejection fraction, by estimation,  is 60 to 65%. The left ventricle has normal function. The left ventricle  has no regional wall motion  abnormalities. There is severe left ventricular hypertrophy. Left  ventricular  diastolic parameters are consistent with Grade I diastolic  dysfunction (impaired relaxation). Elevated left ventricular end-diastolic  pressure. The average left ventricular  global longitudinal strain is -8.2 %. The global longitudinal strain is  abnormal.   2. Right ventricular systolic function is normal. The right ventricular  size is normal.   3. Left atrial size was moderately dilated.   4. The mitral valve is abnormal. No evidence of mitral valve  regurgitation. No evidence of mitral stenosis. Moderate mitral annular  calcification.   5. The aortic valve is tricuspid. There is mild calcification of the  aortic valve. There is mild thickening of the aortic valve. Aortic valve  regurgitation is not visualized. Aortic valve sclerosis is present, with  no evidence of aortic valve stenosis.   6. Aortic dilatation noted. There is moderate dilatation of the aortic  root, measuring 41 mm. There is mild dilatation of the ascending aorta,  measuring 39 mm.   7. The inferior vena cava  is normal in size with greater than 50%  respiratory variability, suggesting right atrial pressure of 3 mmHg.   Recent Labs: 01/02/2023: ALT 15; B Natriuretic Peptide 543.6; TSH 3.148 01/05/2023: Hemoglobin 12.4; Platelets 119 01/06/2023: BUN 19; Creatinine, Ser 1.42; Magnesium 1.6; Potassium 3.5; Sodium 138   Recent Lipid Panel    Component Value Date/Time   CHOL 107 01/04/2023 0014   TRIG 226 (H) 01/04/2023 0014   HDL 31 (L) 01/04/2023 0014   CHOLHDL 3.5 01/04/2023 0014   VLDL 45 (H) 01/04/2023 0014   LDLCALC 31 01/04/2023 0014    Physical Exam:   VS:  There were no vitals taken for this visit.   Wt Readings from Last 3 Encounters:  01/05/23 245 lb 9.5 oz (111.4 kg)  11/05/22 234 lb 8 oz (106.4 kg)  07/15/22 236 lb (107 kg)    General: Well nourished, well developed, in no acute distress Head: Atraumatic, normal size  Eyes: PEERLA, EOMI  Neck: Supple, no JVD Endocrine: No thryomegaly Cardiac:  Normal S1, S2; RRR; no murmurs, rubs, or gallops Lungs: Clear to auscultation bilaterally, no wheezing, rhonchi or rales  Abd: Soft, nontender, no hepatomegaly  Ext: No edema, pulses 2+ Musculoskeletal: No deformities, BUE and BLE strength normal and equal Skin: Warm and dry, no rashes   Neuro: Alert and oriented to person, place, time, and situation, CNII-XII grossly intact, no focal deficits  Psych: Normal mood and affect   ASSESSMENT:   Alma Muegge is a 67 y.o. male who presents for the following: No diagnosis found.  PLAN:   There are no diagnoses linked to this encounter.  {Are you ordering a CV Procedure (e.g. stress test, cath, DCCV, TEE, etc)?   Press F2        :[id number]}  Disposition: No follow-ups on file.  Medication Adjustments/Labs and Tests Ordered: Current medicines are reviewed at length with the patient today.  Concerns regarding medicines are outlined above.  No orders of the defined types were placed in this encounter.  No orders of the defined types were placed in this encounter.  There are no Patient Instructions on file for this visit.   Time Spent with Patient: I have spent a total of *** minutes with patient reviewing hospital notes, telemetry, EKGs, labs and examining the patient as well as establishing an assessment and plan that was discussed with the patient.  > 50% of time was spent in direct patient care.  Signed, Lenna Gilford. Flora Lipps, MD, [facility]  [facility]  [address] [place], [place] [address] [phone]  01/24/2023 3:04 PM

## 2023-01-25 ENCOUNTER — Ambulatory Visit: Payer: 59 | Attending: Cardiovascular Disease | Admitting: Cardiovascular Disease

## 2023-01-25 DIAGNOSIS — I517 Cardiomegaly: Secondary | ICD-10-CM

## 2023-01-25 DIAGNOSIS — I150 Renovascular hypertension: Secondary | ICD-10-CM

## 2023-01-25 DIAGNOSIS — I5032 Chronic diastolic (congestive) heart failure: Secondary | ICD-10-CM

## 2023-01-25 DIAGNOSIS — I480 Paroxysmal atrial fibrillation: Secondary | ICD-10-CM

## 2023-03-06 NOTE — Progress Notes (Unsigned)
No chief complaint on file.     ASSESSMENT AND PLAN  Jon [name] is a 67 y.o. male   Postherpetic neuralgia involving right upper thoracic dermatome  Tried and failed multiple medications in the past including gabapentin, Lyrica, Cymbalta,  Will try Trileptal 300 mg twice a day,  Diclofenac gel mixed with Emla gel as needed      DIAGNOSTIC DATA (LABS, IMAGING, TESTING) - I reviewed patient records, labs, notes, testing and imaging myself where available. Laboratory evaluation from October 2023: Creatinine 1.93, EGFR 38, otherwise normal CMP, CBC hemoglobin of 13.5, sodium 139  MEDICAL HISTORY:   Update 03/07/2023 JM: Was started on Trileptal 300 mg twice daily at prior visit ***       Consult visit 11/05/2022 Dr. Vanessa Kick: Jon [name] is a 67 year old right-handed male, accompanied by his sister at visit on November 05, 2022, seen in request by his primary care nurse practitioner Loura Back for evaluation of postherpetic neuralgia   I reviewed and summarized the referring note. PMHX HTN Kidney transplant in 2018 at [facility], Before that he was on HD for 12 years. HLD  He is a native of [place], has been [place], now on disability due to his medical issues, lives alone  He suffered a severe case of shingles outbreak in February 2023, involving right upper chest, T2-T6 region, blister gradually improved, but he has significant neuropathic pain ever since, complains of burning, shooting sensation, skin sensitivity, hard to sleep, he has to hold his T-shirt up to avoid the cloth touching his skin,  Reviewing the chart, he has tried Lyrica, gabapentin, Cymbalta, with no help at all    PHYSICAL EXAM:   There were no vitals filed for this visit.  Not recorded     There is no height or weight on file to calculate BMI.  PHYSICAL EXAMNIATION:  Gen: NAD, conversant, well nourised, well groomed                     Cardiovascular: Regular rate rhythm, no peripheral  edema, warm, nontender. Eyes: Conjunctivae clear without exudates or hemorrhage Neck: Supple, no carotid bruits. Pulmonary: Clear to auscultation bilaterally   NEUROLOGICAL EXAM:  MENTAL STATUS: Speech/cognition: Awake, alert, oriented to history taking and casual conversation CRANIAL NERVES: CN II: Visual fields are full to confrontation. Pupils are round equal and briskly reactive to light. CN III, IV, VI: extraocular movement are normal. No ptosis. CN V: Facial sensation is intact to light touch CN VII: Face is symmetric with normal eye closure  CN VIII: Hearing is normal to causal conversation. CN IX, X: Phonation is normal. CN XI: Head turning and shoulder shrug are intact  MOTOR: There is no pronator drift of out-stretched arms. Muscle bulk and tone are normal. Muscle strength is normal.  REFLEXES: Reflexes are 1 and symmetric at the biceps, triceps, knees, and ankles. Plantar responses are flexor.  SENSORY: Skin sensitivity at right T2-T6 region, there are dark skin mark from previous shingles outbreak  COORDINATION: There is no trunk or limb dysmetria noted.  GAIT/STANCE: Posture is normal. Gait is steady with    REVIEW OF SYSTEMS:  Full 14 system review of systems performed and notable only for as above All other review of systems were negative.   ALLERGIES: No Known Allergies  HOME MEDICATIONS: Current Outpatient Medications  Medication Sig Dispense Refill   albuterol (PROVENTIL HFA;VENTOLIN HFA) 108 (90 Base) MCG/ACT inhaler Inhale 2 puffs into the lungs every 6 (  six) hours as needed for wheezing or shortness of breath. 1 Inhaler 2   amLODipine (NORVASC) 10 MG tablet Take 10 mg by mouth at bedtime.     apixaban (ELIQUIS) 5 MG TABS tablet Take 1 tablet (5 mg total) by mouth 2 (two) times daily. 60 tablet 3   atorvastatin (LIPITOR) 20 MG tablet Take 20 mg by mouth at bedtime.     brimonidine-timolol (COMBIGAN) 0.2-0.5 % ophthalmic solution Place 1 drop into  both eyes every 12 (twelve) hours.     carvedilol (COREG) 25 MG tablet Take 1 tablet (25 mg total) by mouth 2 (two) times daily with a meal. 60 tablet 0   Diclofenac Sodium 1 % CREA 1gram qid prn (Patient taking differently: Apply 1 g topically 4 (four) times daily as needed (for pain- to affected sites).) 120 g 11   gabapentin (NEURONTIN) 100 MG capsule Take 1 capsule (100 mg total) by mouth 3 (three) times daily. 90 capsule 3   hydrALAZINE (APRESOLINE) 100 MG tablet Take 1 tablet (100 mg total) by mouth every 8 (eight) hours. 90 tablet 3   isosorbide mononitrate (IMDUR) 60 MG 24 hr tablet Take 1 tablet (60 mg total) by mouth daily. 30 tablet 3   lidocaine-prilocaine (EMLA) cream Apply 1 Application topically as needed. 1 gram qid prn (Patient taking differently: Apply 1 g topically 4 (four) times daily as needed (for pain).) 30 g 11   magnesium oxide (MAG-OX) 400 MG tablet Take 800 mg by mouth See admin instructions. Take 800 mg by mouth two times a day- 2 hours before or after Cellcept     mycophenolate (CELLCEPT) 200 MG/ML suspension Take 1,000 mg by mouth 2 (two) times a day.     OXcarbazepine (TRILEPTAL) 300 MG/5ML suspension Take 5 mLs (300 mg total) by mouth 2 (two) times daily. 250 mL 12   PHOS-NAK 280-160-250 MG PACK Take 2 Packages by mouth 3 (three) times daily.     predniSONE 5 MG/5ML solution Take 5 mg by mouth daily.     sulfamethoxazole-trimethoprim (BACTRIM) 200-40 MG/5ML suspension Take 10 mLs by mouth every Monday, Wednesday, and Friday.     tacrolimus (PROGRAF) 1 MG capsule Take 2-3 mg by mouth See admin instructions. Take 3 mg by mouth in the morning and 2 mg at bedtime     VYZULTA 0.024 % SOLN Place 1 drop into both eyes at bedtime.     No current facility-administered medications for this visit.    PAST MEDICAL HISTORY: Past Medical History:  Diagnosis Date   Chronic kidney disease    Dialysis patient (HCC)    Dyspnea    GERD (gastroesophageal reflux disease)     History of blood transfusion    Hyperlipidemia    takes Atorvastatin daily   Hypertension    takes Amlodipine,Losartan,and Labetalol daily   Myocardial infarction [facility])    lived in [place] maybe 7 years ago   Small bowel obstruction (HCC)     PAST SURGICAL HISTORY: Past Surgical History:  Procedure Laterality Date   ARTERIOVENOUS GRAFT PLACEMENT     AV FISTULA PLACEMENT Right 10/25/2015   Procedure: ARTERIOVENOUS (AV) FISTULA CREATION RIGHT FOREARM;  Surgeon: Chuck Hint, MD;  Location: [facility] OR;  Service: Vascular;  Laterality: Right;   BOTOX INJECTION N/A 08/07/2021   Procedure: cystoscopy BOTOX INJECTION;  Surgeon: Crista Elliot, MD;  Location: [facility] ORS;  Service: Urology;  Laterality: N/A;   CARDIAC CATHETERIZATION N/A 05/05/2015   Procedure: Left Heart Cath  and Coronary Angiography;  Surgeon: Rinaldo Cloud, MD;  Location: [facility] INVASIVE CV LAB;  Service: Cardiovascular;  Laterality: N/A;   LIGATION OF ARTERIOVENOUS  FISTULA Left 01/06/2019   Procedure: EXCISION OF ANEURYSMAL  ARTERIOVENOUS  FISTULA LEFT ARM;  Surgeon: Chuck Hint, MD;  Location: [facility] OR;  Service: Vascular;  Laterality: Left;   PERIPHERAL VASCULAR CATHETERIZATION N/A 08/18/2015   Procedure: Fistulagram;  Surgeon: Fransisco Hertz, MD;  Location: [facility] INVASIVE CV LAB;  Service: Cardiovascular;  Laterality: N/A;   PERIPHERAL VASCULAR CATHETERIZATION Right 04/19/2016   Procedure: Fistulagram;  Surgeon: Fransisco Hertz, MD;  Location: [facility] INVASIVE CV LAB;  Service: Cardiovascular;  Laterality: Right;   RESECTION OF ARTERIOVENOUS FISTULA ANEURYSM Left 02/05/2013   Procedure: REPAIR OF ANEURYSM OF LEFT ARM ARTERIOVENOUS FISTULA ;  Surgeon: Nada Libman, MD;  Location: [facility] OR;  Service: Vascular;  Laterality: Left;   RESECTION OF ARTERIOVENOUS FISTULA ANEURYSM Left 03/08/2015   Procedure: REPAIR OF LEFT ARTERIOVENOUS FISTULA PSEUDOANEURYSM;  Surgeon: Chuck Hint, MD;  Location: [facility] OR;  Service: Vascular;   Laterality: Left;   REVISON OF ARTERIOVENOUS FISTULA Left 06/02/2015   Procedure: PLICATION OF A LARGE ANEURYSM LEFT UPPER ARM BRACHIO-CEPHALIC ARTERIOVENOUS FISTULA ;  Surgeon: Chuck Hint, MD;  Location: [facility] OR;  Service: Vascular;  Laterality: Left;   SMALL INTESTINE SURGERY      FAMILY HISTORY: Family History  Problem Relation Age of Onset   Hypertension Mother    Other Mother        varicose veins   Aneurysm Mother    Diabetes Father    Hypertension Father    Heart attack Father    Hypertension Sister    Other Sister        varicose veins   Other Brother        varicose veins    SOCIAL HISTORY: Social History   Socioeconomic History   Marital status: Married    Spouse name: Not on file   Number of children: 4   Years of education: Not on file   Highest education level: Not on file  Occupational History   Occupation: DISABILITY    Employer: UNEMPLOYED  Tobacco Use   Smoking status: Never   Smokeless tobacco: Never  Vaping Use   Vaping status: Never Used  Substance and Sexual Activity   Alcohol use: No    Alcohol/week: 0.0 standard drinks of alcohol   Drug use: No   Sexual activity: Not on file  Other Topics Concern   Not on file  Social History Narrative   Not on file   Social Determinants of Health   Financial Resource Strain: Not on file  Food Insecurity: No Food Insecurity (01/02/2023)   Hunger Vital Sign    Worried About Running Out of Food in the Last Year: Never true    Ran Out of Food in the Last Year: Never true  Transportation Needs: No Transportation Needs (01/02/2023)   PRAPARE - Administrator, Civil Service (Medical): No    Lack of Transportation (Non-Medical): No  Physical Activity: Not on file  Stress: Not on file  Social Connections: Patient Declined (01/05/2023)   Social Connection and Isolation Panel [NHANES]    Frequency of Communication with Friends and Family: Patient declined    Frequency of Social Gatherings  with Friends and Family: Patient declined    Attends Religious Services: Patient declined    Database administrator or Organizations: Patient declined  Attends Banker Meetings: Patient declined    Marital Status: Patient declined  Intimate Partner Violence: Not At Risk (01/02/2023)   Humiliation, Afraid, Rape, and Kick questionnaire    Fear of Current or Ex-Partner: No    Emotionally Abused: No    Physically Abused: No    Sexually Abused: No     I spent *** minutes of face-to-face and non-face-to-face time with patient.  This included previsit chart review, lab review, study review, order entry, electronic health record documentation, patient education and discussion regarding above diagnoses and treatment plan and answered all other questions to patient's satisfaction  Ihor Austin, [facility]  [facility] [address] [place], [place] [address]  Phone [phone] Fax [phone] Note: This document was prepared with digital dictation and possible smart phrase technology. Any transcriptional errors that result from this process are unintentional.

## 2023-03-07 ENCOUNTER — Ambulatory Visit (INDEPENDENT_AMBULATORY_CARE_PROVIDER_SITE_OTHER): Payer: 59 | Admitting: Adult Health

## 2023-03-07 ENCOUNTER — Encounter: Payer: Self-pay | Admitting: Adult Health

## 2023-03-07 VITALS — BP 108/62 | HR 73 | Ht 72.0 in | Wt 249.0 lb

## 2023-03-07 DIAGNOSIS — B0229 Other postherpetic nervous system involvement: Secondary | ICD-10-CM | POA: Diagnosis not present

## 2023-03-07 NOTE — Patient Instructions (Addendum)
Your Plan:  Continue pregablin 100mg  twice daily for now - can follow up with PCP next week if pain persists for dosage increase  Continue gabapentin 100mg  three times daily for now - can consider stopping if pain controlled  Please follow up with your primary doctor regarding leg swelling and bump (?cyst) on right knee  Recommend rescheduling visit with cardiology Dr. Flora Lipps - office number [phone]         Thank you for coming to see [place] at [facility]. I hope we have been able to provide you high quality care today.  You may receive a patient satisfaction survey over the next few weeks. We would appreciate your feedback and comments so that we may continue to improve ourselves and the health of our patients.

## 2023-05-17 ENCOUNTER — Other Ambulatory Visit (HOSPITAL_COMMUNITY): Payer: Self-pay | Admitting: Nephrology

## 2023-05-17 DIAGNOSIS — R0609 Other forms of dyspnea: Secondary | ICD-10-CM

## 2023-05-30 ENCOUNTER — Ambulatory Visit (HOSPITAL_COMMUNITY)
Admission: RE | Admit: 2023-05-30 | Discharge: 2023-05-30 | Disposition: A | Discharge status: Home / Self Care | Payer: 59 | Source: Ambulatory Visit | Attending: Nephrology | Admitting: Nephrology

## 2023-05-30 DIAGNOSIS — E785 Hyperlipidemia, unspecified: Secondary | ICD-10-CM | POA: Diagnosis not present

## 2023-05-30 DIAGNOSIS — I1310 Hypertensive heart and chronic kidney disease without heart failure, with stage 1 through stage 4 chronic kidney disease, or unspecified chronic kidney disease: Secondary | ICD-10-CM | POA: Diagnosis not present

## 2023-05-30 DIAGNOSIS — N189 Chronic kidney disease, unspecified: Secondary | ICD-10-CM | POA: Insufficient documentation

## 2023-05-30 DIAGNOSIS — R0609 Other forms of dyspnea: Secondary | ICD-10-CM | POA: Diagnosis present

## 2023-05-30 DIAGNOSIS — I3481 Nonrheumatic mitral (valve) annulus calcification: Secondary | ICD-10-CM | POA: Insufficient documentation

## 2023-05-30 LAB — ECHOCARDIOGRAM COMPLETE
AR max vel: 2.13 cm2
AV Area VTI: 2.19 cm2
AV Area mean vel: 2.12 cm2
AV Mean grad: 8.5 mm[Hg]
AV Peak grad: 15.2 mm[Hg]
Ao pk vel: 1.95 m/s
Area-P 1/2: 3.31 cm2
MV M vel: 3.04 m/s
MV Peak grad: 37 mm[Hg]
P 1/2 time: 396 ms
S' Lateral: 3 cm

## 2023-05-30 NOTE — Progress Notes (Signed)
Echocardiogram 2D Echocardiogram has been performed.  Jon [name] 05/30/2023, 2:31 PM

## 2023-05-30 NOTE — Progress Notes (Signed)
Echocardiogram 2D Echocardiogram has been performed.  Lucendia Herrlich 05/30/2023, 2:31 PM

## 2023-07-20 ENCOUNTER — Emergency Department (HOSPITAL_COMMUNITY): Payer: 59

## 2023-07-20 ENCOUNTER — Emergency Department (HOSPITAL_COMMUNITY)
Admission: EM | Admit: 2023-07-20 | Discharge: 2023-07-21 | Disposition: A | Discharge status: Home / Self Care | Payer: 59 | Attending: Emergency Medicine | Admitting: Emergency Medicine

## 2023-07-20 ENCOUNTER — Encounter (HOSPITAL_COMMUNITY): Payer: Self-pay | Admitting: Emergency Medicine

## 2023-07-20 ENCOUNTER — Other Ambulatory Visit: Payer: Self-pay

## 2023-07-20 DIAGNOSIS — I10 Essential (primary) hypertension: Secondary | ICD-10-CM | POA: Diagnosis not present

## 2023-07-20 DIAGNOSIS — N261 Atrophy of kidney (terminal): Secondary | ICD-10-CM | POA: Insufficient documentation

## 2023-07-20 DIAGNOSIS — I480 Paroxysmal atrial fibrillation: Secondary | ICD-10-CM | POA: Diagnosis not present

## 2023-07-20 DIAGNOSIS — S0083XA Contusion of other part of head, initial encounter: Secondary | ICD-10-CM | POA: Diagnosis not present

## 2023-07-20 DIAGNOSIS — Y92410 Unspecified street and highway as the place of occurrence of the external cause: Secondary | ICD-10-CM | POA: Diagnosis not present

## 2023-07-20 DIAGNOSIS — I700 Atherosclerosis of aorta: Secondary | ICD-10-CM | POA: Diagnosis not present

## 2023-07-20 DIAGNOSIS — N400 Enlarged prostate without lower urinary tract symptoms: Secondary | ICD-10-CM | POA: Insufficient documentation

## 2023-07-20 DIAGNOSIS — M25549 Pain in joints of unspecified hand: Secondary | ICD-10-CM | POA: Insufficient documentation

## 2023-07-20 DIAGNOSIS — Z7901 Long term (current) use of anticoagulants: Secondary | ICD-10-CM | POA: Diagnosis not present

## 2023-07-20 DIAGNOSIS — M542 Cervicalgia: Secondary | ICD-10-CM | POA: Insufficient documentation

## 2023-07-20 DIAGNOSIS — S0990XA Unspecified injury of head, initial encounter: Secondary | ICD-10-CM | POA: Diagnosis present

## 2023-07-20 DIAGNOSIS — Z940 Kidney transplant status: Secondary | ICD-10-CM | POA: Diagnosis not present

## 2023-07-20 DIAGNOSIS — R0789 Other chest pain: Secondary | ICD-10-CM | POA: Diagnosis not present

## 2023-07-20 DIAGNOSIS — M25532 Pain in left wrist: Secondary | ICD-10-CM | POA: Diagnosis not present

## 2023-07-20 LAB — CBC WITH DIFFERENTIAL/PLATELET
Abs Immature Granulocytes: 0.06 10*3/uL (ref 0.00–0.07)
Basophils Absolute: 0 10*3/uL (ref 0.0–0.1)
Basophils Relative: 1 %
Eosinophils Absolute: 0.1 10*3/uL (ref 0.0–0.5)
Eosinophils Relative: 3 %
HCT: 44.8 % (ref 39.0–52.0)
Hemoglobin: 13.7 g/dL (ref 13.0–17.0)
Immature Granulocytes: 1 %
Lymphocytes Relative: 22 %
Lymphs Abs: 1 10*3/uL (ref 0.7–4.0)
MCH: 26.6 pg (ref 26.0–34.0)
MCHC: 30.6 g/dL (ref 30.0–36.0)
MCV: 86.8 fL (ref 80.0–100.0)
Monocytes Absolute: 0.8 10*3/uL (ref 0.1–1.0)
Monocytes Relative: 17 %
Neutro Abs: 2.4 10*3/uL (ref 1.7–7.7)
Neutrophils Relative %: 56 %
Platelets: 108 10*3/uL — ABNORMAL LOW (ref 150–400)
RBC: 5.16 MIL/uL (ref 4.22–5.81)
RDW: 14.2 % (ref 11.5–15.5)
WBC: 4.4 10*3/uL (ref 4.0–10.5)
nRBC: 0 % (ref 0.0–0.2)

## 2023-07-20 LAB — COMPREHENSIVE METABOLIC PANEL
ALT: 13 U/L (ref 0–44)
AST: 26 U/L (ref 15–41)
Albumin: 3.7 g/dL (ref 3.5–5.0)
Alkaline Phosphatase: 83 U/L (ref 38–126)
Anion gap: 9 (ref 5–15)
BUN: 8 mg/dL (ref 8–23)
CO2: 24 mmol/L (ref 22–32)
Calcium: 9.1 mg/dL (ref 8.9–10.3)
Chloride: 107 mmol/L (ref 98–111)
Creatinine, Ser: 1.5 mg/dL — ABNORMAL HIGH (ref 0.61–1.24)
GFR, Estimated: 51 mL/min — ABNORMAL LOW (ref >60)
Glucose, Bld: 243 mg/dL — ABNORMAL HIGH (ref 70–99)
Potassium: 4.1 mmol/L (ref 3.5–5.1)
Sodium: 140 mmol/L (ref 135–145)
Total Bilirubin: 0.4 mg/dL (ref <1.2)
Total Protein: 6.5 g/dL (ref 6.5–8.1)

## 2023-07-20 LAB — ETHANOL: Alcohol, Ethyl (B): <10 mg/dL (ref <10)

## 2023-07-20 LAB — SAMPLE TO BLOOD BANK

## 2023-07-20 MED ORDER — IOHEXOL 350 MG/ML SOLN
75.0000 mL | Freq: Once | INTRAVENOUS | Status: AC | PRN
  Administered 2023-07-21: 75 mL via INTRAVENOUS

## 2023-07-20 MED ORDER — MORPHINE SULFATE (PF) 4 MG/ML IV SOLN
4.0000 mg | Freq: Once | INTRAVENOUS | Status: AC
  Administered 2023-07-20: 4 mg via INTRAVENOUS
  Filled 2023-07-20: qty 1
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
## 2023-07-20 NOTE — ED Notes (Signed)
Patient has old fistula on the right side. He said it no longer works and that side is okay for IV. He has a thrill in it and I double checked with the provider, and with the patient again they said it was fine. IV started, blood retrieved and IV morphine given.
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
## 2023-07-21 NOTE — Progress Notes (Signed)
Orthopedic Tech Progress Note Patient Details:  Jon [name] 13-Feb-1956 [id number]  Ortho Devices Type of Ortho Device: Velcro wrist splint Ortho Device/Splint Location: lue Ortho Device/Splint Interventions: Ordered, Application, Adjustment   Post Interventions Patient Tolerated: Well Instructions Provided: Care of device, Adjustment of device  Trinna Post 07/21/2023, 3:47 AM

## 2023-07-21 NOTE — ED Notes (Signed)
Patient tolerated PO and was able to get out of bed and ambulate but with a lot of pain. Said his neck and wrist caused him the most pain.

## 2023-07-21 NOTE — Discharge Instructions (Addendum)
Your imaging today was all negative for acute injuries. You will likely be sore for the next several days. Tylenol as needed for pain.  I would avoid NSAIDs (motrin, etc) given your kidney function and history. Can use heat/ice as well. Follow-up with your doctor if ongoing issues. Return to the ED for new concerns.

## 2023-07-24 ENCOUNTER — Emergency Department (HOSPITAL_COMMUNITY): Payer: 59

## 2023-07-24 ENCOUNTER — Emergency Department (HOSPITAL_COMMUNITY)
Admission: EM | Admit: 2023-07-24 | Discharge: 2023-07-24 | Disposition: A | Discharge status: Home / Self Care | Payer: 59 | Attending: Emergency Medicine | Admitting: Emergency Medicine

## 2023-07-24 ENCOUNTER — Other Ambulatory Visit: Payer: Self-pay

## 2023-07-24 ENCOUNTER — Encounter (HOSPITAL_COMMUNITY): Payer: Self-pay

## 2023-07-24 DIAGNOSIS — J320 Chronic maxillary sinusitis: Secondary | ICD-10-CM

## 2023-07-24 DIAGNOSIS — S0990XA Unspecified injury of head, initial encounter: Secondary | ICD-10-CM | POA: Diagnosis present

## 2023-07-24 DIAGNOSIS — Y92410 Unspecified street and highway as the place of occurrence of the external cause: Secondary | ICD-10-CM | POA: Diagnosis not present

## 2023-07-24 DIAGNOSIS — S0083XA Contusion of other part of head, initial encounter: Secondary | ICD-10-CM | POA: Diagnosis not present

## 2023-07-24 DIAGNOSIS — Z7901 Long term (current) use of anticoagulants: Secondary | ICD-10-CM | POA: Insufficient documentation

## 2023-07-24 DIAGNOSIS — J0100 Acute maxillary sinusitis, unspecified: Secondary | ICD-10-CM | POA: Insufficient documentation

## 2023-07-24 DIAGNOSIS — Z79899 Other long term (current) drug therapy: Secondary | ICD-10-CM | POA: Diagnosis not present

## 2023-07-24 DIAGNOSIS — I10 Essential (primary) hypertension: Secondary | ICD-10-CM | POA: Diagnosis not present

## 2023-07-24 MED ORDER — AMOXICILLIN-POT CLAVULANATE 875-125 MG PO TABS: 1.0000 | ORAL_TABLET | Freq: Two times a day (BID) | ORAL | 0 refills | Status: DC

## 2023-07-24 MED ORDER — NAPROXEN 500 MG PO TABS: 500.0000 mg | ORAL_TABLET | Freq: Two times a day (BID) | ORAL | 0 refills | Status: DC

## 2023-07-24 MED ORDER — NAPROXEN 250 MG PO TABS
500.0000 mg | ORAL_TABLET | Freq: Once | ORAL | Status: AC
  Administered 2023-07-24: 500 mg via ORAL
  Filled 2023-07-24: qty 2

## 2023-07-24 NOTE — ED Notes (Signed)
Patient transported to CT 
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
## 2023-08-12 ENCOUNTER — Encounter (HOSPITAL_COMMUNITY): Payer: Self-pay | Admitting: Emergency Medicine

## 2023-08-12 ENCOUNTER — Other Ambulatory Visit: Payer: Self-pay

## 2023-08-12 ENCOUNTER — Emergency Department (HOSPITAL_COMMUNITY): Payer: 59

## 2023-08-12 ENCOUNTER — Emergency Department (HOSPITAL_COMMUNITY)
Admission: EM | Admit: 2023-08-12 | Discharge: 2023-08-13 | Discharge status: Against Medical Advice | Payer: 59 | Attending: Emergency Medicine | Admitting: Emergency Medicine

## 2023-08-12 DIAGNOSIS — R0602 Shortness of breath: Secondary | ICD-10-CM | POA: Insufficient documentation

## 2023-08-12 DIAGNOSIS — Z5321 Procedure and treatment not carried out due to patient leaving prior to being seen by health care provider: Secondary | ICD-10-CM | POA: Insufficient documentation

## 2023-08-12 DIAGNOSIS — R079 Chest pain, unspecified: Secondary | ICD-10-CM | POA: Diagnosis present

## 2023-08-12 LAB — CBC
HCT: 44.5 % (ref 39.0–52.0)
Hemoglobin: 13.7 g/dL (ref 13.0–17.0)
MCH: 26.7 pg (ref 26.0–34.0)
MCHC: 30.8 g/dL (ref 30.0–36.0)
MCV: 86.6 fL (ref 80.0–100.0)
Platelets: 98 10*3/uL — ABNORMAL LOW (ref 150–400)
RBC: 5.14 MIL/uL (ref 4.22–5.81)
RDW: 14.4 % (ref 11.5–15.5)
WBC: 4.4 10*3/uL (ref 4.0–10.5)
nRBC: 0 % (ref 0.0–0.2)

## 2023-08-12 LAB — COMPREHENSIVE METABOLIC PANEL
ALT: 16 U/L (ref 0–44)
AST: 23 U/L (ref 15–41)
Albumin: 3.9 g/dL (ref 3.5–5.0)
Alkaline Phosphatase: 80 U/L (ref 38–126)
Anion gap: 11 (ref 5–15)
BUN: 11 mg/dL (ref 8–23)
CO2: 23 mmol/L (ref 22–32)
Calcium: 9.3 mg/dL (ref 8.9–10.3)
Chloride: 106 mmol/L (ref 98–111)
Creatinine, Ser: 1.37 mg/dL — ABNORMAL HIGH (ref 0.61–1.24)
GFR, Estimated: 57 mL/min — ABNORMAL LOW (ref >60)
Glucose, Bld: 155 mg/dL — ABNORMAL HIGH (ref 70–99)
Potassium: 3.8 mmol/L (ref 3.5–5.1)
Sodium: 140 mmol/L (ref 135–145)
Total Bilirubin: 0.8 mg/dL (ref 0.0–1.2)
Total Protein: 6.9 g/dL (ref 6.5–8.1)

## 2023-08-12 LAB — TROPONIN I (HIGH SENSITIVITY): Troponin I (High Sensitivity): 85 ng/L — ABNORMAL HIGH (ref <18)

## 2023-08-12 LAB — BRAIN NATRIURETIC PEPTIDE: B Natriuretic Peptide: 210.9 pg/mL — ABNORMAL HIGH (ref 0.0–100.0)
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
## 2023-08-12 NOTE — ED Provider Triage Note (Signed)
 Emergency Medicine Provider Triage Evaluation Note  Jon [name] , a 68 y.o. male  was evaluated in triage.  Pt complains of chest pain, shortness of breath. Date began few days ago.  Notes his legs are swollen as well.  He is not on any sort of diuresis.  He is a kidney transplant recipient and takes his rejection medication as prescribed.  States that he had similar pain on Christmas Day was seen and evaluated and discharged home.  Review of Systems  Positive:  Negative:   Physical Exam  BP 138/84 (BP Location: Left Arm)   Pulse 73   Temp 98.2 F (36.8 C)   Resp 17   SpO2 98%  Gen:   Awake, no distress   Resp:  Normal effort  MSK:   Moves extremities without difficulty  Other:    Medical Decision Making  Medically screening exam initiated at 6:45 PM.  Appropriate orders placed.  Jon [name] was informed that the remainder of the evaluation will be completed by another provider, this initial triage assessment does not replace that evaluation, and the importance of remaining in the ED until their evaluation is complete.  Work-up initiated   Jon [name] A, PA-C 08/12/23 1846

## 2023-08-13 ENCOUNTER — Observation Stay (HOSPITAL_COMMUNITY)
Admission: EM | Admit: 2023-08-13 | Discharge: 2023-08-16 | Disposition: A | Discharge status: Home / Self Care | Payer: 59 | Attending: Family Medicine | Admitting: Family Medicine

## 2023-08-13 ENCOUNTER — Emergency Department (HOSPITAL_BASED_OUTPATIENT_CLINIC_OR_DEPARTMENT_OTHER): Payer: 59

## 2023-08-13 ENCOUNTER — Encounter (HOSPITAL_COMMUNITY): Payer: Self-pay | Admitting: Emergency Medicine

## 2023-08-13 ENCOUNTER — Emergency Department (HOSPITAL_COMMUNITY): Payer: 59

## 2023-08-13 DIAGNOSIS — R079 Chest pain, unspecified: Principal | ICD-10-CM | POA: Diagnosis present

## 2023-08-13 DIAGNOSIS — I5033 Acute on chronic diastolic (congestive) heart failure: Secondary | ICD-10-CM

## 2023-08-13 DIAGNOSIS — D84821 Immunodeficiency due to drugs: Secondary | ICD-10-CM | POA: Insufficient documentation

## 2023-08-13 DIAGNOSIS — I2510 Atherosclerotic heart disease of native coronary artery without angina pectoris: Secondary | ICD-10-CM | POA: Diagnosis present

## 2023-08-13 DIAGNOSIS — E876 Hypokalemia: Secondary | ICD-10-CM | POA: Diagnosis present

## 2023-08-13 DIAGNOSIS — I119 Hypertensive heart disease without heart failure: Secondary | ICD-10-CM | POA: Insufficient documentation

## 2023-08-13 DIAGNOSIS — R0602 Shortness of breath: Secondary | ICD-10-CM | POA: Diagnosis not present

## 2023-08-13 DIAGNOSIS — K219 Gastro-esophageal reflux disease without esophagitis: Secondary | ICD-10-CM | POA: Diagnosis not present

## 2023-08-13 DIAGNOSIS — D631 Anemia in chronic kidney disease: Secondary | ICD-10-CM | POA: Diagnosis not present

## 2023-08-13 DIAGNOSIS — I480 Paroxysmal atrial fibrillation: Secondary | ICD-10-CM | POA: Diagnosis not present

## 2023-08-13 DIAGNOSIS — E785 Hyperlipidemia, unspecified: Secondary | ICD-10-CM | POA: Insufficient documentation

## 2023-08-13 DIAGNOSIS — I129 Hypertensive chronic kidney disease with stage 1 through stage 4 chronic kidney disease, or unspecified chronic kidney disease: Secondary | ICD-10-CM | POA: Diagnosis not present

## 2023-08-13 DIAGNOSIS — N189 Chronic kidney disease, unspecified: Secondary | ICD-10-CM | POA: Diagnosis not present

## 2023-08-13 DIAGNOSIS — I080 Rheumatic disorders of both mitral and aortic valves: Secondary | ICD-10-CM | POA: Insufficient documentation

## 2023-08-13 DIAGNOSIS — M7989 Other specified soft tissue disorders: Secondary | ICD-10-CM

## 2023-08-13 DIAGNOSIS — Z79899 Other long term (current) drug therapy: Secondary | ICD-10-CM | POA: Diagnosis not present

## 2023-08-13 DIAGNOSIS — I5031 Acute diastolic (congestive) heart failure: Secondary | ICD-10-CM | POA: Diagnosis not present

## 2023-08-13 DIAGNOSIS — R600 Localized edema: Secondary | ICD-10-CM | POA: Insufficient documentation

## 2023-08-13 DIAGNOSIS — E782 Mixed hyperlipidemia: Secondary | ICD-10-CM | POA: Diagnosis present

## 2023-08-13 DIAGNOSIS — D696 Thrombocytopenia, unspecified: Secondary | ICD-10-CM | POA: Diagnosis not present

## 2023-08-13 DIAGNOSIS — Z1152 Encounter for screening for COVID-19: Secondary | ICD-10-CM | POA: Insufficient documentation

## 2023-08-13 DIAGNOSIS — Z7901 Long term (current) use of anticoagulants: Secondary | ICD-10-CM | POA: Insufficient documentation

## 2023-08-13 DIAGNOSIS — I252 Old myocardial infarction: Secondary | ICD-10-CM | POA: Diagnosis not present

## 2023-08-13 DIAGNOSIS — Z940 Kidney transplant status: Secondary | ICD-10-CM

## 2023-08-13 DIAGNOSIS — R7989 Other specified abnormal findings of blood chemistry: Secondary | ICD-10-CM | POA: Diagnosis not present

## 2023-08-13 DIAGNOSIS — I10 Essential (primary) hypertension: Secondary | ICD-10-CM | POA: Diagnosis present

## 2023-08-13 LAB — CBC WITH DIFFERENTIAL/PLATELET
Abs Immature Granulocytes: 0.18 10*3/uL — ABNORMAL HIGH (ref 0.00–0.07)
Basophils Absolute: 0 10*3/uL (ref 0.0–0.1)
Basophils Relative: 1 %
Eosinophils Absolute: 0.1 10*3/uL (ref 0.0–0.5)
Eosinophils Relative: 3 %
HCT: 41.2 % (ref 39.0–52.0)
Hemoglobin: 12.8 g/dL — ABNORMAL LOW (ref 13.0–17.0)
Immature Granulocytes: 4 %
Lymphocytes Relative: 23 %
Lymphs Abs: 1 10*3/uL (ref 0.7–4.0)
MCH: 26.9 pg (ref 26.0–34.0)
MCHC: 31.1 g/dL (ref 30.0–36.0)
MCV: 86.7 fL (ref 80.0–100.0)
Monocytes Absolute: 0.9 10*3/uL (ref 0.1–1.0)
Monocytes Relative: 20 %
Neutro Abs: 2.2 10*3/uL (ref 1.7–7.7)
Neutrophils Relative %: 49 %
Platelets: 101 10*3/uL — ABNORMAL LOW (ref 150–400)
RBC: 4.75 MIL/uL (ref 4.22–5.81)
RDW: 14.3 % (ref 11.5–15.5)
WBC: 4.4 10*3/uL (ref 4.0–10.5)
nRBC: 0 % (ref 0.0–0.2)

## 2023-08-13 LAB — COMPREHENSIVE METABOLIC PANEL
ALT: 16 U/L (ref 0–44)
AST: 20 U/L (ref 15–41)
Albumin: 3.8 g/dL (ref 3.5–5.0)
Alkaline Phosphatase: 68 U/L (ref 38–126)
Anion gap: 5 (ref 5–15)
BUN: 12 mg/dL (ref 8–23)
CO2: 23 mmol/L (ref 22–32)
Calcium: 8.8 mg/dL — ABNORMAL LOW (ref 8.9–10.3)
Chloride: 110 mmol/L (ref 98–111)
Creatinine, Ser: 1.25 mg/dL — ABNORMAL HIGH (ref 0.61–1.24)
GFR, Estimated: >60 mL/min (ref >60)
Glucose, Bld: 133 mg/dL — ABNORMAL HIGH (ref 70–99)
Potassium: 3.1 mmol/L — ABNORMAL LOW (ref 3.5–5.1)
Sodium: 138 mmol/L (ref 135–145)
Total Bilirubin: 0.9 mg/dL (ref 0.0–1.2)
Total Protein: 6.6 g/dL (ref 6.5–8.1)

## 2023-08-13 LAB — URINALYSIS, W/ REFLEX TO CULTURE (INFECTION SUSPECTED)
Bacteria, UA: NONE SEEN
Bilirubin Urine: NEGATIVE
Glucose, UA: NEGATIVE mg/dL
Hgb urine dipstick: NEGATIVE
Ketones, ur: NEGATIVE mg/dL
Leukocytes,Ua: NEGATIVE
Nitrite: NEGATIVE
Protein, ur: 100 mg/dL — AB
Specific Gravity, Urine: 1.009 (ref 1.005–1.030)
pH: 8 (ref 5.0–8.0)

## 2023-08-13 LAB — RESP PANEL BY RT-PCR (RSV, FLU A&B, COVID)  RVPGX2
Influenza A by PCR: NEGATIVE
Influenza B by PCR: NEGATIVE
Resp Syncytial Virus by PCR: NEGATIVE
SARS Coronavirus 2 by RT PCR: NEGATIVE

## 2023-08-13 LAB — TROPONIN I (HIGH SENSITIVITY)
Troponin I (High Sensitivity): 122 ng/L (ref <18)
Troponin I (High Sensitivity): 113 ng/L (ref <18)

## 2023-08-13 MED ORDER — CARVEDILOL 12.5 MG PO TABS
25.0000 mg | ORAL_TABLET | Freq: Two times a day (BID) | ORAL | Status: DC
  Administered 2023-08-13: 25 mg via ORAL
  Filled 2023-08-13: qty 2

## 2023-08-13 MED ORDER — HYDRALAZINE HCL 50 MG PO TABS
100.0000 mg | ORAL_TABLET | Freq: Three times a day (TID) | ORAL | Status: DC
  Administered 2023-08-13 – 2023-08-16 (×7): 100 mg via ORAL
  Filled 2023-08-13 (×10): qty 2

## 2023-08-13 MED ORDER — APIXABAN 5 MG PO TABS
5.0000 mg | ORAL_TABLET | Freq: Two times a day (BID) | ORAL | Status: DC
  Administered 2023-08-13: 5 mg via ORAL
  Filled 2023-08-13: qty 1

## 2023-08-13 MED ORDER — PREDNISONE 5 MG/5ML PO SOLN
5.0000 mg | Freq: Every day | ORAL | Status: DC
  Administered 2023-08-13 – 2023-08-16 (×3): 5 mg via ORAL
  Filled 2023-08-13 (×4): qty 5

## 2023-08-13 MED ORDER — TACROLIMUS 1 MG PO CAPS: 2.0000 mg | ORAL_CAPSULE | ORAL | Status: DC

## 2023-08-13 MED ORDER — AMLODIPINE BESYLATE 10 MG PO TABS
10.0000 mg | ORAL_TABLET | Freq: Every day | ORAL | Status: DC
Start: 2023-08-13 — End: 2023-08-16
  Administered 2023-08-13 – 2023-08-15 (×3): 10 mg via ORAL
  Filled 2023-08-13 (×2): qty 1
  Filled 2023-08-13: qty 2

## 2023-08-13 MED ORDER — ONDANSETRON HCL 4 MG PO TABS: 4.0000 mg | ORAL_TABLET | Freq: Four times a day (QID) | ORAL | Status: DC | PRN

## 2023-08-13 MED ORDER — TACROLIMUS 1 MG PO CAPS
3.0000 mg | ORAL_CAPSULE | Freq: Every day | ORAL | Status: DC
  Administered 2023-08-14 – 2023-08-16 (×2): 3 mg via ORAL
  Filled 2023-08-13 (×3): qty 3

## 2023-08-13 MED ORDER — ONDANSETRON HCL 4 MG/2ML IJ SOLN
4.0000 mg | Freq: Four times a day (QID) | INTRAMUSCULAR | Status: DC | PRN
Start: 2023-08-13 — End: 2023-08-16

## 2023-08-13 MED ORDER — ACETAMINOPHEN 650 MG RE SUPP: 650.0000 mg | Freq: Four times a day (QID) | RECTAL | Status: DC | PRN

## 2023-08-13 MED ORDER — ACETAMINOPHEN 325 MG PO TABS
650.0000 mg | ORAL_TABLET | Freq: Four times a day (QID) | ORAL | Status: DC | PRN
  Administered 2023-08-13: 650 mg via ORAL
  Filled 2023-08-13: qty 2

## 2023-08-13 MED ORDER — MYCOPHENOLATE MOFETIL 200 MG/ML PO SUSR
1000.0000 mg | Freq: Two times a day (BID) | ORAL | Status: DC
Start: 2023-08-13 — End: 2023-08-16
  Administered 2023-08-14 – 2023-08-16 (×5): 1000 mg via ORAL
  Filled 2023-08-13 (×6): qty 5

## 2023-08-13 MED ORDER — POTASSIUM CHLORIDE CRYS ER 20 MEQ PO TBCR
40.0000 meq | EXTENDED_RELEASE_TABLET | Freq: Once | ORAL | Status: AC
  Administered 2023-08-13: 40 meq via ORAL
  Filled 2023-08-13: qty 2

## 2023-08-13 MED ORDER — ATORVASTATIN CALCIUM 20 MG PO TABS
20.0000 mg | ORAL_TABLET | Freq: Every day | ORAL | Status: DC
  Administered 2023-08-13 – 2023-08-15 (×3): 20 mg via ORAL
  Filled 2023-08-13: qty 1
  Filled 2023-08-13: qty 2
  Filled 2023-08-13: qty 1

## 2023-08-13 MED ORDER — FUROSEMIDE 10 MG/ML IJ SOLN
40.0000 mg | Freq: Once | INTRAMUSCULAR | Status: AC
  Administered 2023-08-13: 40 mg via INTRAVENOUS
  Filled 2023-08-13: qty 4

## 2023-08-13 MED ORDER — PREGABALIN 75 MG PO CAPS
200.0000 mg | ORAL_CAPSULE | Freq: Three times a day (TID) | ORAL | Status: DC
  Administered 2023-08-13 – 2023-08-16 (×6): 200 mg via ORAL
  Filled 2023-08-13: qty 1
  Filled 2023-08-13: qty 4
  Filled 2023-08-13: qty 1
  Filled 2023-08-13: qty 4
  Filled 2023-08-13 (×2): qty 1

## 2023-08-13 MED ORDER — ISOSORBIDE MONONITRATE ER 60 MG PO TB24
60.0000 mg | ORAL_TABLET | Freq: Every day | ORAL | Status: DC
  Filled 2023-08-13: qty 1

## 2023-08-13 MED ORDER — TACROLIMUS 1 MG PO CAPS
2.0000 mg | ORAL_CAPSULE | Freq: Every day | ORAL | Status: DC
  Administered 2023-08-13 – 2023-08-15 (×3): 2 mg via ORAL
  Filled 2023-08-13 (×3): qty 2

## 2023-08-13 NOTE — ED Notes (Signed)
 Provided Pt with urinal. Pt aware urine sample needed.

## 2023-08-13 NOTE — ED Triage Notes (Signed)
 Pt here from [facility] ED lobby with c/o sob and chest pain along with some swelling to his feet

## 2023-08-13 NOTE — ED Notes (Signed)
 ED TO INPATIENT HANDOFF REPORT  ED Nurse Name and Phone #: [place]/[phone]  S Name/Age/Gender Jon [name] 68 y.o. male Room/Bed: WA11/WA11  Code Status   Code Status: Full Code  Home/SNF/Other Home Patient oriented to: self, place, time, and situation Is this baseline? Yes   Triage Complete: Triage complete  Chief Complaint Chest pain [R07.9]  Triage Note Pt here from [facility] ED lobby with c/o sob and chest pain along with some swelling to his feet    Allergies No Known Allergies  Level of Care/Admitting Diagnosis ED Disposition     ED Disposition  Admit   Condition  --   Comment  Hospital Area: [facility] [[id number]]  Level of Care: Telemetry [5]  Admit to tele based on following criteria: Monitor for Ischemic changes  May place patient in observation at [facility] or [facility] if equivalent level of care is available:: No  Covid Evaluation: Confirmed COVID Negative  Diagnosis: Chest pain [255200]  Admitting Physician: CELINDA ALM LOT [[id number]]  Attending Physician: CELINDA ALM LOT [[id number]]          B Medical/Surgery History Past Medical History:  Diagnosis Date   Chronic kidney disease    Dialysis patient (HCC)    Dyspnea    GERD (gastroesophageal reflux disease)    History of blood transfusion    Hyperlipidemia    takes Atorvastatin  daily   Hypertension    takes Amlodipine ,Losartan ,and Labetalol  daily   Myocardial infarction (HCC)    lived in [place]  maybe 7 years ago   Small bowel obstruction (HCC)    Past Surgical History:  Procedure Laterality Date   ARTERIOVENOUS GRAFT PLACEMENT     AV FISTULA PLACEMENT Right 10/25/2015   Procedure: ARTERIOVENOUS (AV) FISTULA CREATION RIGHT FOREARM;  Surgeon: Lonni GORMAN Blade, MD;  Location: [facility] OR;  Service: Vascular;  Laterality: Right;   BOTOX  INJECTION N/A 08/07/2021   Procedure: cystoscopy BOTOX  INJECTION;  Surgeon: Carolee Sherwood [name] DOUGLAS, MD;  Location: [facility] ORS;  Service:  Urology;  Laterality: N/A;   CARDIAC CATHETERIZATION N/A 05/05/2015   Procedure: Left Heart Cath and Coronary Angiography;  Surgeon: Rober Chroman, MD;  Location: [facility] INVASIVE CV LAB;  Service: Cardiovascular;  Laterality: N/A;   LIGATION OF ARTERIOVENOUS  FISTULA Left 01/06/2019   Procedure: EXCISION OF ANEURYSMAL  ARTERIOVENOUS  FISTULA LEFT ARM;  Surgeon: Blade Lonni GORMAN, MD;  Location: [facility] OR;  Service: Vascular;  Laterality: Left;   PERIPHERAL VASCULAR CATHETERIZATION N/A 08/18/2015   Procedure: Fistulagram;  Surgeon: Redell LITTIE Door, MD;  Location: [facility] INVASIVE CV LAB;  Service: Cardiovascular;  Laterality: N/A;   PERIPHERAL VASCULAR CATHETERIZATION Right 04/19/2016   Procedure: Fistulagram;  Surgeon: Redell LITTIE Door, MD;  Location: [facility] INVASIVE CV LAB;  Service: Cardiovascular;  Laterality: Right;   RESECTION OF ARTERIOVENOUS FISTULA ANEURYSM Left 02/05/2013   Procedure: REPAIR OF ANEURYSM OF LEFT ARM ARTERIOVENOUS FISTULA ;  Surgeon: Gaile LELON New, MD;  Location: [facility] OR;  Service: Vascular;  Laterality: Left;   RESECTION OF ARTERIOVENOUS FISTULA ANEURYSM Left 03/08/2015   Procedure: REPAIR OF LEFT ARTERIOVENOUS FISTULA PSEUDOANEURYSM;  Surgeon: Lonni GORMAN Blade, MD;  Location: [facility] OR;  Service: Vascular;  Laterality: Left;   REVISON OF ARTERIOVENOUS FISTULA Left 06/02/2015   Procedure: PLICATION OF A LARGE ANEURYSM LEFT UPPER ARM BRACHIO-CEPHALIC ARTERIOVENOUS FISTULA ;  Surgeon: Lonni GORMAN Blade, MD;  Location: [facility] OR;  Service: Vascular;  Laterality: Left;   SMALL INTESTINE SURGERY       A IV Location/Drains/Wounds  Patient Lines/Drains/Airways Status     Active Line/Drains/Airways     Name Placement date Placement time Site Days   Peripheral IV 08/13/23 20 G 1 Anterior;Left;Proximal Forearm 08/13/23  1511  Forearm  less than 1   Fistula / Graft Right Forearm Arteriovenous fistula 10/25/15  1014  Forearm  2849            Intake/Output Last 24 hours  Intake/Output Summary  (Last 24 hours) at 08/13/2023 1949 Last data filed at 08/13/2023 1629 Gross per 24 hour  Intake 360 ml  Output 800 ml  Net -440 ml    Labs/Imaging Results for orders placed or performed during the hospital encounter of 08/13/23 (from the past 48 hours)  Troponin I (High Sensitivity)     Status: Abnormal   Collection Time: 08/13/23  8:04 AM  Result Value Ref Range   Troponin I (High Sensitivity) 122 (HH) <18 ng/L    Comment: CRITICAL RESULT CALLED TO, READ BACK BY AND VERIFIED WITH ISABRA GOSSELIN, RN @0909  08/13/23 BY VIN (NOTE) Elevated high sensitivity troponin I (hsTnI) values and significant  changes across serial measurements may suggest ACS but many other  chronic and acute conditions are known to elevate hsTnI results.  Refer to the Links section for chest pain algorithms and additional  guidance. Performed at [facility], [address] [place], [place] [address]   Resp panel by RT-PCR (RSV, Flu A&B, Covid) Anterior Nasal Swab     Status: None   Collection Time: 08/13/23  9:05 AM   Specimen: Anterior Nasal Swab  Result Value Ref Range   SARS Coronavirus 2 by RT PCR NEGATIVE NEGATIVE    Comment: (NOTE) SARS-CoV-2 target nucleic acids are NOT DETECTED.  The SARS-CoV-2 RNA is generally detectable in upper respiratory specimens during the acute phase of infection. The lowest concentration of SARS-CoV-2 viral copies this assay can detect is 138 copies/mL. A negative result does not preclude SARS-Cov-2 infection and should not be used as the sole basis for treatment or other patient management decisions. A negative result may occur with  improper specimen collection/handling, submission of specimen other than nasopharyngeal swab, presence of viral mutation(s) within the areas targeted by this assay, and inadequate number of viral copies(<138 copies/mL). A negative result must be combined with clinical observations, patient history, and  epidemiological information. The expected result is Negative.  Fact Sheet for Patients:  bloggercourse.com  Fact Sheet for Healthcare Providers:  seriousbroker.it  This test is no t yet approved or cleared by the [place]  FDA and  has been authorized for detection and/or diagnosis of SARS-CoV-2 by FDA under an Emergency Use Authorization (EUA). This EUA will remain  in effect (meaning this test can be used) for the duration of the COVID-19 declaration under Section 564(b)(1) of the Act, 21 U.S.C.section 360bbb-3(b)(1), unless the authorization is terminated  or revoked sooner.       Influenza A by PCR NEGATIVE NEGATIVE   Influenza B by PCR NEGATIVE NEGATIVE    Comment: (NOTE) The Xpert Xpress SARS-CoV-2/FLU/RSV plus assay is intended as an aid in the diagnosis of influenza from Nasopharyngeal swab specimens and should not be used as a sole basis for treatment. Nasal washings and aspirates are unacceptable for Xpert Xpress SARS-CoV-2/FLU/RSV testing.  Fact Sheet for Patients: bloggercourse.com  Fact Sheet for Healthcare Providers: seriousbroker.it  This test is not yet approved or cleared by the [place]  FDA and has been authorized for detection and/or diagnosis of SARS-CoV-2 by FDA  under an Emergency Use Authorization (EUA). This EUA will remain in effect (meaning this test can be used) for the duration of the COVID-19 declaration under Section 564(b)(1) of the Act, 21 U.S.C. section 360bbb-3(b)(1), unless the authorization is terminated or revoked.     Resp Syncytial Virus by PCR NEGATIVE NEGATIVE    Comment: (NOTE) Fact Sheet for Patients: bloggercourse.com  Fact Sheet for Healthcare Providers: seriousbroker.it  This test is not yet approved or cleared by the [place]  FDA and has been authorized for  detection and/or diagnosis of SARS-CoV-2 by FDA under an Emergency Use Authorization (EUA). This EUA will remain in effect (meaning this test can be used) for the duration of the COVID-19 declaration under Section 564(b)(1) of the Act, 21 U.S.C. section 360bbb-3(b)(1), unless the authorization is terminated or revoked.  Performed at [facility], [address], [place], [place] [address]   Urinalysis, w/ Reflex to Culture (Infection Suspected) -Urine, Clean Catch     Status: Abnormal   Collection Time: 08/13/23 10:24 AM  Result Value Ref Range   Specimen Source URINE, CLEAN CATCH    Color, Urine YELLOW YELLOW   APPearance HAZY (A) CLEAR   Specific Gravity, Urine 1.009 1.005 - 1.030   pH 8.0 5.0 - 8.0   Glucose, UA NEGATIVE NEGATIVE mg/dL   Hgb urine dipstick NEGATIVE NEGATIVE   Bilirubin Urine NEGATIVE NEGATIVE   Ketones, ur NEGATIVE NEGATIVE mg/dL   Protein, ur 899 (A) NEGATIVE mg/dL   Nitrite NEGATIVE NEGATIVE   Leukocytes,Ua NEGATIVE NEGATIVE   RBC / HPF 0-5 0 - 5 RBC/hpf   WBC, UA 0-5 0 - 5 WBC/hpf    Comment:        Reflex urine culture not performed if WBC <=10, OR if Squamous epithelial cells >5. If Squamous epithelial cells >5 suggest recollection.    Bacteria, UA NONE SEEN NONE SEEN   Squamous Epithelial / HPF 0-5 0 - 5 /HPF   Mucus PRESENT    Amorphous Crystal PRESENT     Comment: Performed at [facility], [address] [place], [place] [address]  CBC with Differential     Status: Abnormal   Collection Time: 08/13/23 10:42 AM  Result Value Ref Range   WBC 4.4 4.0 - 10.5 K/uL   RBC 4.75 4.22 - 5.81 MIL/uL   Hemoglobin 12.8 (L) 13.0 - 17.0 g/dL   HCT 58.7 60.9 - 47.9 %   MCV 86.7 80.0 - 100.0 fL   MCH 26.9 26.0 - 34.0 pg   MCHC 31.1 30.0 - 36.0 g/dL   RDW 85.6 88.4 - 84.4 %   Platelets 101 (L) 150 - 400 K/uL    Comment: SPECIMEN CHECKED FOR CLOTS REPEATED TO VERIFY PLATELET COUNT CONFIRMED BY SMEAR    nRBC 0.0 0.0  - 0.2 %   Neutrophils Relative % 49 %   Neutro Abs 2.2 1.7 - 7.7 K/uL   Lymphocytes Relative 23 %   Lymphs Abs 1.0 0.7 - 4.0 K/uL   Monocytes Relative 20 %   Monocytes Absolute 0.9 0.1 - 1.0 K/uL   Eosinophils Relative 3 %   Eosinophils Absolute 0.1 0.0 - 0.5 K/uL   Basophils Relative 1 %   Basophils Absolute 0.0 0.0 - 0.1 K/uL   Immature Granulocytes 4 %   Abs Immature Granulocytes 0.18 (H) 0.00 - 0.07 K/uL    Comment: Performed at [facility], [address] [place], [place] [address]  Comprehensive metabolic panel  Status: Abnormal   Collection Time: 08/13/23 10:42 AM  Result Value Ref Range   Sodium 138 135 - 145 mmol/L   Potassium 3.1 (L) 3.5 - 5.1 mmol/L   Chloride 110 98 - 111 mmol/L   CO2 23 22 - 32 mmol/L   Glucose, Bld 133 (H) 70 - 99 mg/dL    Comment: Glucose reference range applies only to samples taken after fasting for at least 8 hours.   BUN 12 8 - 23 mg/dL   Creatinine, Ser 8.74 (H) 0.61 - 1.24 mg/dL   Calcium  8.8 (L) 8.9 - 10.3 mg/dL   Total Protein 6.6 6.5 - 8.1 g/dL   Albumin  3.8 3.5 - 5.0 g/dL   AST 20 15 - 41 U/L   ALT 16 0 - 44 U/L   Alkaline Phosphatase 68 38 - 126 U/L   Total Bilirubin 0.9 0.0 - 1.2 mg/dL   GFR, Estimated >39 >39 mL/min    Comment: (NOTE) Calculated using the CKD-EPI Creatinine Equation (2021)    Anion gap 5 5 - 15    Comment: Performed at [facility], [address], [place], [place] [address]  Troponin I (High Sensitivity)     Status: Abnormal   Collection Time: 08/13/23 10:42 AM  Result Value Ref Range   Troponin I (High Sensitivity) 113 (HH) <18 ng/L    Comment: CRITICAL VALUE NOTED. VALUE IS CONSISTENT WITH PREVIOUSLY REPORTED/CALLED VALUE (NOTE) Elevated high sensitivity troponin I (hsTnI) values and significant  changes across serial measurements may suggest ACS but many other  chronic and acute conditions are known to elevate hsTnI results.  Refer to the Links section for  chest pain algorithms and additional  guidance. Performed at [facility], [address] [place], [place] [address]    VAS US  LOWER EXTREMITY VENOUS (DVT) (ONLY [facility]) Result Date: 08/13/2023  Lower Venous DVT Study Patient Name:  Jon [name]  Date of Exam:   08/13/2023 Medical Rec #: [id number]    Accession #:    [id number] Date of Birth: 1956-07-28    Patient Gender: M Patient Age:   69 years Exam Location:  [facility] Procedure:      VAS US  LOWER EXTREMITY VENOUS (DVT) Referring Phys: LONNI SAKAI --------------------------------------------------------------------------------  Indications: Swelling, and Edema.  Risk Factors: Suspected PE obesity. Comparison Study: None. Performing Technologist: Garnette Rockers  Examination Guidelines: A complete evaluation includes B-mode imaging, spectral Doppler, color Doppler, and power Doppler as needed of all accessible portions of each vessel. Bilateral testing is considered an integral part of a complete examination. Limited examinations for reoccurring indications may be performed as noted. The reflux portion of the exam is performed with the patient in reverse Trendelenburg.  +---------+---------------+---------+-----------+----------+--------------+ RIGHT    CompressibilityPhasicitySpontaneityPropertiesThrombus Aging +---------+---------------+---------+-----------+----------+--------------+ CFV      Full           Yes      Yes                                 +---------+---------------+---------+-----------+----------+--------------+ SFJ      Full                                                        +---------+---------------+---------+-----------+----------+--------------+ FV Prox  Full                                                        +---------+---------------+---------+-----------+----------+--------------+ FV Mid   Full                                                         +---------+---------------+---------+-----------+----------+--------------+ FV DistalFull                                                        +---------+---------------+---------+-----------+----------+--------------+ PFV      Full                                                        +---------+---------------+---------+-----------+----------+--------------+ POP      Full           Yes      Yes                                 +---------+---------------+---------+-----------+----------+--------------+ PTV      Full                                                        +---------+---------------+---------+-----------+----------+--------------+ PERO     Full                                                        +---------+---------------+---------+-----------+----------+--------------+   +---------+---------------+---------+-----------+----------+--------------+ LEFT     CompressibilityPhasicitySpontaneityPropertiesThrombus Aging +---------+---------------+---------+-----------+----------+--------------+ CFV      Full           Yes      Yes                                 +---------+---------------+---------+-----------+----------+--------------+ SFJ      Full                                                        +---------+---------------+---------+-----------+----------+--------------+ FV Prox  Full                                                        +---------+---------------+---------+-----------+----------+--------------+  FV Mid   Full                                                        +---------+---------------+---------+-----------+----------+--------------+ FV DistalFull                                                        +---------+---------------+---------+-----------+----------+--------------+ PFV      Full                                                         +---------+---------------+---------+-----------+----------+--------------+ POP      Full           Yes      Yes                                 +---------+---------------+---------+-----------+----------+--------------+ PTV      Full                                                        +---------+---------------+---------+-----------+----------+--------------+ PERO     Full                                                        +---------+---------------+---------+-----------+----------+--------------+     Summary: BILATERAL: - No evidence of deep vein thrombosis seen in the lower extremities, bilaterally. -No evidence of popliteal cyst, bilaterally.   *See table(s) above for measurements and observations. Electronically signed by Norman Serve on 08/13/2023 at 1:30:34 PM.    Final    CT CHEST WO CONTRAST Result Date: 08/13/2023 CLINICAL DATA:  Respiratory illness. Shortness of breath and right-sided chest pain. EXAM: CT CHEST WITHOUT CONTRAST TECHNIQUE: Multidetector CT imaging of the chest was performed following the standard protocol without IV contrast. RADIATION DOSE REDUCTION: This exam was performed according to the departmental dose-optimization program which includes automated exposure control, adjustment of the mA and/or kV according to patient size and/or use of iterative reconstruction technique. COMPARISON:  Chest radiograph dated 08/12/2023. CT dated 07/20/2023. FINDINGS: Evaluation of this exam is limited in the absence of intravenous contrast. Cardiovascular: There is no cardiomegaly or pericardial effusion. Small pericardial effusion. Three vessel coronary vascular calcification. Mild atherosclerotic calcification of the thoracic aorta. No aneurysmal dilatation. The central pulmonary arteries are grossly unremarkable on this noncontrast CT. Mediastinum/Nodes: No hilar or mediastinal adenopathy. The esophagus is grossly unremarkable. No mediastinal fluid collection.  Lungs/Pleura: Similar appearance of right lung nodules measure up to 11 mm in the right middle lobe as the prior CT of 07/20/2023. These nodules have been present dating back to CT of 2020. A  9 mm nodule in the right lower lobe (88/4) however has increased in size since 2020 (previously measuring approximately 4 mm). These favor to represent a benign/post inflammatory process. Follow-up with CT in 6 months recommended. No consolidative changes. There is no pleural effusion or pneumothorax. The central airways are patent. Upper Abdomen: Atrophic kidneys.  Gallstone. Musculoskeletal: No acute osseous pathology. IMPRESSION: 1. No acute intrathoracic pathology. 2. Several right pulmonary nodules as above, likely benign/postinflammatory. Follow-up with CT in 6 months recommended. 3.  Aortic Atherosclerosis (ICD10-I70.0). Electronically Signed   By: Vanetta Chou M.D.   On: 08/13/2023 10:44   DG Chest 2 View Result Date: 08/12/2023 CLINICAL DATA:  Chest pain and shortness of breath. EXAM: CHEST - 2 VIEW COMPARISON:  July 20, 2023 FINDINGS: The heart size and mediastinal contours are within normal limits. Very mild atelectasis and/or infiltrate is seen within the right lung base. No pleural effusion or pneumothorax is identified. The visualized skeletal structures are unremarkable. IMPRESSION: Very mild right basilar atelectasis and/or infiltrate. Electronically Signed   By: Suzen Dials M.D.   On: 08/12/2023 20:15    Pending Labs Unresulted Labs (From admission, onward)     Start     Ordered   08/14/23 0500  CBC  Tomorrow morning,   R        08/13/23 1412   08/14/23 0500  Comprehensive metabolic panel  Tomorrow morning,   R        08/13/23 1412   08/13/23 0917  Urine Culture  Once,   URGENT       Question:  Indication  Answer:  Dysuria   08/13/23 0916            Vitals/Pain Today's Vitals   08/13/23 1429 08/13/23 1514 08/13/23 1808 08/13/23 1838  BP: (!) 173/117  128/82 133/86   Pulse:    73  Resp: 17   18  Temp:  98.6 F (37 C)  98.2 F (36.8 C)  TempSrc:  Oral  Oral  SpO2:    99%  PainSc:        Isolation Precautions No active isolations  Medications Medications  isosorbide  mononitrate (IMDUR ) 24 hr tablet 60 mg (has no administration in time range)  acetaminophen  (TYLENOL ) tablet 650 mg (has no administration in time range)    Or  acetaminophen  (TYLENOL ) suppository 650 mg (has no administration in time range)  ondansetron  (ZOFRAN ) tablet 4 mg (has no administration in time range)    Or  ondansetron  (ZOFRAN ) injection 4 mg (has no administration in time range)  amLODipine  (NORVASC ) tablet 10 mg (has no administration in time range)  hydrALAZINE  (APRESOLINE ) tablet 100 mg (100 mg Oral Given 08/13/23 1637)  atorvastatin  (LIPITOR) tablet 20 mg (has no administration in time range)  apixaban  (ELIQUIS ) tablet 5 mg (has no administration in time range)  mycophenolate  (CELLCEPT ) 200 MG/ML suspension 1,000 mg (has no administration in time range)  predniSONE  5 MG/5ML solution 5 mg (has no administration in time range)  pregabalin  (LYRICA ) capsule 200 mg (has no administration in time range)  tacrolimus  (PROGRAF ) capsule 3 mg (has no administration in time range)    And  tacrolimus  (PROGRAF ) capsule 2 mg (has no administration in time range)  furosemide  (LASIX ) injection 40 mg (40 mg Intravenous Given 08/13/23 1430)  potassium chloride  SA (KLOR-CON  M) CR tablet 40 mEq (40 mEq Oral Given 08/13/23 1429)    Mobility walks       R Report given to:

## 2023-08-13 NOTE — H&P (Signed)
 History and Physical    Patient: Jon [name] FMW:[id number] DOB: 12-Feb-1956 DOA: 08/13/2023 DOS: the patient was seen and examined on 08/13/2023 PCP: Leontine Cramp, NP  Patient coming from: Home  Chief Complaint: Chest pain.  HPI: Jon [name] is a 68 y.o. male with medical history significant of CKD, history of hemodialysis, status post renal transplant, GERD, hyperlipidemia, hypertension, CAD, history of MI, small bowel obstruction who came to the emergency department at the [facility] from [facility] ED lobby due to dyspnea, chest pain and lower extremity edema.  No palpitations, diaphoresis, PND, orthopnea.  He denied fever, chills, rhinorrhea, sore throat, wheezing or hemoptysis. No abdominal pain, nausea, emesis, diarrhea, constipation, melena or hematochezia.  No flank pain, dysuria, frequency or hematuria.  No polyuria, polydipsia, polyphagia or blurred vision.   Lab work: Urinalysis was hazy with proteinuria 100 mg/deciliter but was otherwise unremarkable.  CBC showed white count 4.4, hemoglobin 12.8 g/dL.  Platelets 101.  Coronavirus, influenza and RSV PCR was negative.  Troponin was 122 then 113 mg/dL.  CMP showed a potassium of 3.1 mmol/L, glucose 133 and creatinine 1.25 mg/dL.  The rest of the CMP measurements were normal after calcium  was corrected.  Imaging: 2 view chest radiograph showing very mild right basilar atelectasis and/or infiltrates.  Heart size and mediastinal contours are within normal limits.  CT chest without contrast with no acute intrathoracic pathology.  Several right pulmonary nodules as above, likely benign/postinflammatory.  Follow-up CT in 6 months recommended.  Aortic atherosclerosis.   ED course: Initial vital signs were temperature 98.8 F, pulse 79, respiration 18, BP 164/100 mmHg O2 sat 100% on room air.  Patient received furosemide  40 mg IVP and K-Lor 40 mEq p.o. x 1.  Review of Systems: As mentioned in the history of present illness. All other systems reviewed  and are negative. Past Medical History:  Diagnosis Date   Chronic kidney disease    Dialysis patient (HCC)    Dyspnea    GERD (gastroesophageal reflux disease)    History of blood transfusion    Hyperlipidemia    takes Atorvastatin  daily   Hypertension    takes Amlodipine ,Losartan ,and Labetalol  daily   Myocardial infarction [facility])    lived in [place]  maybe 7 years ago   Small bowel obstruction [facility])    Past Surgical History:  Procedure Laterality Date   ARTERIOVENOUS GRAFT PLACEMENT     AV FISTULA PLACEMENT Right 10/25/2015   Procedure: ARTERIOVENOUS (AV) FISTULA CREATION RIGHT FOREARM;  Surgeon: Lonni GORMAN Blade, MD;  Location: [facility] OR;  Service: Vascular;  Laterality: Right;   BOTOX  INJECTION N/A 08/07/2021   Procedure: cystoscopy BOTOX  INJECTION;  Surgeon: Carolee Sherwood [name] DOUGLAS, MD;  Location: [facility] ORS;  Service: Urology;  Laterality: N/A;   CARDIAC CATHETERIZATION N/A 05/05/2015   Procedure: Left Heart Cath and Coronary Angiography;  Surgeon: Rober Chroman, MD;  Location: [facility] INVASIVE CV LAB;  Service: Cardiovascular;  Laterality: N/A;   LIGATION OF ARTERIOVENOUS  FISTULA Left 01/06/2019   Procedure: EXCISION OF ANEURYSMAL  ARTERIOVENOUS  FISTULA LEFT ARM;  Surgeon: Blade Lonni GORMAN, MD;  Location: [facility] OR;  Service: Vascular;  Laterality: Left;   PERIPHERAL VASCULAR CATHETERIZATION N/A 08/18/2015   Procedure: Fistulagram;  Surgeon: Redell LITTIE Door, MD;  Location: [facility] INVASIVE CV LAB;  Service: Cardiovascular;  Laterality: N/A;   PERIPHERAL VASCULAR CATHETERIZATION Right 04/19/2016   Procedure: Fistulagram;  Surgeon: Redell LITTIE Door, MD;  Location: [facility] INVASIVE CV LAB;  Service: Cardiovascular;  Laterality: Right;   RESECTION  OF ARTERIOVENOUS FISTULA ANEURYSM Left 02/05/2013   Procedure: REPAIR OF ANEURYSM OF LEFT ARM ARTERIOVENOUS FISTULA ;  Surgeon: Gaile LELON New, MD;  Location: [facility] OR;  Service: Vascular;  Laterality: Left;   RESECTION OF ARTERIOVENOUS FISTULA ANEURYSM Left 03/08/2015    Procedure: REPAIR OF LEFT ARTERIOVENOUS FISTULA PSEUDOANEURYSM;  Surgeon: Lonni GORMAN Blade, MD;  Location: [facility] OR;  Service: Vascular;  Laterality: Left;   REVISON OF ARTERIOVENOUS FISTULA Left 06/02/2015   Procedure: PLICATION OF A LARGE ANEURYSM LEFT UPPER ARM BRACHIO-CEPHALIC ARTERIOVENOUS FISTULA ;  Surgeon: Lonni GORMAN Blade, MD;  Location: [facility] OR;  Service: Vascular;  Laterality: Left;   SMALL INTESTINE SURGERY     Social History:  reports that he has never smoked. He has never used smokeless tobacco. He reports that he does not drink alcohol and does not use drugs.  No Known Allergies  Family History  Problem Relation Age of Onset   Hypertension Mother    Other Mother        varicose veins   Aneurysm Mother    Diabetes Father    Hypertension Father    Heart attack Father    Hypertension Sister    Other Sister        varicose veins   Other Brother        varicose veins    Prior to Admission medications   Medication Sig Start Date End Date Taking? Authorizing Provider  albuterol  (PROVENTIL  HFA;VENTOLIN  HFA) 108 (90 Base) MCG/ACT inhaler Inhale 2 puffs into the lungs every 6 (six) hours as needed for wheezing or shortness of breath. 01/24/17   Alfornia Madison, MD  amLODipine  (NORVASC ) 10 MG tablet Take 10 mg by mouth at bedtime.    [provider]  amoxicillin -clavulanate (AUGMENTIN ) 875-125 MG tablet Take 1 tablet by mouth every 12 (twelve) hours. 07/24/23   Nivia Colon, PA-C  apixaban  (ELIQUIS ) 5 MG TABS tablet Take 1 tablet (5 mg total) by mouth 2 (two) times daily. 01/06/23   Patsy Lenis, MD  atorvastatin  (LIPITOR) 20 MG tablet Take 20 mg by mouth at bedtime.    [provider]  brimonidine -timolol  (COMBIGAN ) 0.2-0.5 % ophthalmic solution Place 1 drop into both eyes every 12 (twelve) hours.    [provider]  carvedilol  (COREG ) 25 MG tablet Take 1 tablet (25 mg total) by mouth 2 (two) times daily with a meal. 04/24/19   Mullis, Kiersten P,  DO  gabapentin  (NEURONTIN ) 100 MG capsule Take 1 capsule (100 mg total) by mouth 3 (three) times daily. 01/06/23   Patsy Lenis, MD  hydrALAZINE  (APRESOLINE ) 100 MG tablet Take 1 tablet (100 mg total) by mouth every 8 (eight) hours. 01/06/23   Patsy Lenis, MD  isosorbide  mononitrate (IMDUR ) 60 MG 24 hr tablet Take 1 tablet (60 mg total) by mouth daily. 01/07/23   Patsy Lenis, MD  magnesium  oxide (MAG-OX) 400 MG tablet Take 800 mg by mouth See admin instructions. Take 800 mg by mouth two times a day- 2 hours before or after Cellcept     [provider]  mycophenolate  (CELLCEPT ) 200 MG/ML suspension Take 1,000 mg by mouth 2 (two) times a day. 03/20/18   [provider]  naproxen  (NAPROSYN ) 500 MG tablet Take 1 tablet (500 mg total) by mouth 2 (two) times daily with a meal. 07/24/23   Nivia Colon, PA-C  PHOS-NAK 280-160-250 MG PACK Take 2 Packages by mouth 3 (three) times daily. 12/15/18   [provider]  predniSONE  5 MG/5ML solution Take 5  mg by mouth daily. 11/18/18   [provider]  pregabalin  (LYRICA ) 100 MG capsule Take 100 mg by mouth 2 (two) times daily.    [provider]  sulfamethoxazole -trimethoprim  (BACTRIM ) 200-40 MG/5ML suspension Take 10 mLs by mouth every Monday, Wednesday, and Friday. 06/18/19   [provider]  tacrolimus  (PROGRAF ) 1 MG capsule Take 2-3 mg by mouth See admin instructions. Take 3 mg by mouth in the morning and 2 mg at bedtime    [provider]  VYZULTA  0.024 % SOLN Place 1 drop into both eyes at bedtime.    [provider]  fluticasone  (FLOVENT  HFA) 44 MCG/ACT inhaler Inhale 1 puff into the lungs 2 (two) times daily. Patient not taking: No sig reported 03/08/17 03/04/21  Shellia Oh, MD    Physical Exam: Vitals:   08/13/23 0748 08/13/23 1052  BP: (!) 164/100 (!) 143/96  Pulse: 79 72  Resp: 18 (!) 23  Temp: 98.8 F (37.1 C) 98.6 F (37 C)  TempSrc: Oral Oral  SpO2: 100% 97%   Physical  Exam Vitals and nursing note reviewed.  Constitutional:      Appearance: Normal appearance. He is ill-appearing.  HENT:     Head: Normocephalic.     Nose: No rhinorrhea.     Mouth/Throat:     Mouth: Mucous membranes are moist.  Eyes:     General: No scleral icterus.    Pupils: Pupils are equal, round, and reactive to light.  Cardiovascular:     Rate and Rhythm: Normal rate and regular rhythm.  Pulmonary:     Effort: Pulmonary effort is normal.     Breath sounds: Normal breath sounds.  Abdominal:     General: Bowel sounds are normal.     Palpations: Abdomen is soft.     Tenderness: There is no abdominal tenderness. There is no guarding.  Musculoskeletal:     Cervical back: Neck supple.     Right lower leg: No edema.     Left lower leg: No edema.  Skin:    General: Skin is warm and dry.  Neurological:     General: No focal deficit present.     Mental Status: He is alert and oriented to person, place, and time.  Psychiatric:        Mood and Affect: Mood normal.        Behavior: Behavior normal.    Data Reviewed:  Results are pending, will review when available.  05/30/2023 echocardiogram WO imaging enhancing. IMPRESSIONS:   1. HTN heart disease. Left ventricular ejection fraction, by estimation,  is 60 to 65%. The left ventricle has normal function. The left ventricle  has no regional wall motion abnormalities. There is severe concentric left  ventricular hypertrophy. Left  ventricular diastolic parameters are consistent with Grade I diastolic  dysfunction (impaired relaxation).   2. Right ventricular systolic function is normal. The right ventricular  size is normal.   3. Left atrial size was mildly dilated.   4. The mitral valve is normal in structure. No evidence of mitral valve  regurgitation. Moderate mitral annular calcification.   5. The aortic valve was not well visualized. Aortic valve regurgitation  is trivial. Aortic valve sclerosis/calcification is  present, without any  evidence of aortic stenosis.   6. The inferior vena cava is dilated in size with >50% respiratory  variability, suggesting right atrial pressure of 8 mmHg.   EKG from yesterday. Vent. rate 75 BPM PR interval 174 ms QRS duration 108  ms QT/QTcB 410/457 ms P-R-T axes 60 -63 109 Sinus rhythm with occasional Premature ventricular complexes Left anterior fascicular block Minimal voltage criteria for LVH, may be normal variant ( Cornell product ) T wave abnormality, consider lateral ischemia Abnormal ECG  EKG today: Vent. rate 73 BPM PR interval 180 ms QRS duration 114 ms QT/QTcB 407/449 ms P-R-T axes 2 -42 116 Sinus rhythm Atrial premature complex Abnormal R-wave progression, late transition Left ventricular hypertrophy T wave abnormality Abnormal ECG  Assessment and Plan: Principal Problem:   Chest pain Associated with:   Elevated troponin In the setting of history of:    CAD (coronary artery disease)  Observation/telemetry. Supplemental oxygen as needed. On apixaban  and atorvastatin . Increase Imdur  to 90 mg. Lexicon Myoview  tomorrow.  Active Problems:   Paroxysmal atrial fibrillation (HCC) Continue apixaban  5 mg p.o. twice daily. On Coreg  25 mg p.o. twice daily. -Will resume beta-blocker after stress test.    Kidney transplant recipient Briefly discussed with nephrology on-call. Dr. Marlee will be following his chart daily. -Available for questions. -Consult as needed. Continue CellCept , tacrolimus  and physiological prednisone .    HTN (hypertension) Continue amlodipine  10 mg p.o. bedtime. Continue hydralazine  100 mg p.o. every 8 hours. Resume carvedilol  after stress test done tomorrow.    Anemia due to chronic kidney disease Monitor hematocrit hemoglobin.    Thrombocytopenia (HCC) Monitor platelet count.    Hypokalemia Replacing. Follow-up potassium level.    Mixed hyperlipidemia Continue atorvastatin  20 mg p.o. daily.      Advance Care Planning:   Code Status: Full Code   Consults: Cardiology Sara Maxcy, MD).  Family Communication:   Severity of Illness: The appropriate patient status for this patient is OBSERVATION. Observation status is judged to be reasonable and necessary in order to provide the required intensity of service to ensure the patient's safety. The patient's presenting symptoms, physical exam findings, and initial radiographic and laboratory data in the context of their medical condition is felt to place them at decreased risk for further clinical deterioration. Furthermore, it is anticipated that the patient will be medically stable for discharge from the hospital within 2 midnights of admission.   Author: Alm Dorn Castor, MD 08/13/2023 2:04 PM  For on call review [email or web address].   This document was prepared using Dragon voice recognition software and may contain some unintended transcription errors.

## 2023-08-13 NOTE — Consult Note (Signed)
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 admission. - Continue to monitor closely. - Continue anti-rejection medications per primary team.   Risk Assessment/Risk Scores:   TIMI Risk Score for Unstable Angina or Non-ST Elevation MI:   The patient's TIMI risk score is 4, which indicates a 20% risk of all cause mortality, new or recurrent myocardial infarction or need for urgent revascularization in the next 14 days.{  CHA2DS2-VASc Score = 3  his indicates a  3.2% annual risk of stroke. The patient's score is based upon: CHF History: 0 HTN History: 1 Diabetes History: 0 Stroke History: 0 Vascular Disease History: 1 Age Score: 1 Gender Score: 0     For questions or updates, please contact [facility] Please consult [email or web address] for contact info under    Signed, Annesha Delgreco E Leianna Barga, PA-C   08/13/2023 12:56 PM

## 2023-08-13 NOTE — Progress Notes (Signed)
 Lower extremity venous duplex completed. Please see CV Procedures for preliminary results.  Shona Simpson, RVT 08/13/23 9:54 AM

## 2023-08-13 NOTE — ED Provider Notes (Signed)
 [facility] EMERGENCY DEPARTMENT AT [facility] Provider Note   CSN: [id number] Arrival date & time: 08/13/23  9260     History  No chief complaint on file.   Jon [name] is a 68 y.o. male.  The history is provided by the patient and medical records. No language interpreter was used.  Chest Pain Pain location:  R chest, substernal area and L chest Pain quality: aching and burning   Pain radiates to:  Does not radiate Pain severity:  Moderate Onset quality:  Gradual Duration:  2 days Timing:  Constant Progression:  Waxing and waning Chronicity:  New Relieved by:  Rest Worsened by:  Exertion Ineffective treatments:  None tried Associated symptoms: lower extremity edema and shortness of breath   Associated symptoms: no abdominal pain, no altered mental status, no anxiety, no back pain, no cough, no fatigue, no fever, no headache, no nausea, no near-syncope, no syncope and no vomiting   Risk factors: coronary artery disease        Home Medications Prior to Admission medications   Medication Sig Start Date End Date Taking? Authorizing Provider  albuterol  (PROVENTIL  HFA;VENTOLIN  HFA) 108 (90 Base) MCG/ACT inhaler Inhale 2 puffs into the lungs every 6 (six) hours as needed for wheezing or shortness of breath. 01/24/17   Alfornia Madison, MD  amLODipine  (NORVASC ) 10 MG tablet Take 10 mg by mouth at bedtime.    [provider]  amoxicillin -clavulanate (AUGMENTIN ) 875-125 MG tablet Take 1 tablet by mouth every 12 (twelve) hours. 07/24/23   Nivia Colon, PA-C  apixaban  (ELIQUIS ) 5 MG TABS tablet Take 1 tablet (5 mg total) by mouth 2 (two) times daily. 01/06/23   Patsy Lenis, MD  atorvastatin  (LIPITOR) 20 MG tablet Take 20 mg by mouth at bedtime.    [provider]  brimonidine -timolol  (COMBIGAN ) 0.2-0.5 % ophthalmic solution Place 1 drop into both eyes every 12 (twelve) hours.    [provider]  carvedilol  (COREG ) 25 MG tablet Take 1 tablet (25  mg total) by mouth 2 (two) times daily with a meal. 04/24/19   Mullis, Kiersten P, DO  gabapentin  (NEURONTIN ) 100 MG capsule Take 1 capsule (100 mg total) by mouth 3 (three) times daily. 01/06/23   Patsy Lenis, MD  hydrALAZINE  (APRESOLINE ) 100 MG tablet Take 1 tablet (100 mg total) by mouth every 8 (eight) hours. 01/06/23   Patsy Lenis, MD  isosorbide  mononitrate (IMDUR ) 60 MG 24 hr tablet Take 1 tablet (60 mg total) by mouth daily. 01/07/23   Patsy Lenis, MD  magnesium  oxide (MAG-OX) 400 MG tablet Take 800 mg by mouth See admin instructions. Take 800 mg by mouth two times a day- 2 hours before or after Cellcept     [provider]  mycophenolate  (CELLCEPT ) 200 MG/ML suspension Take 1,000 mg by mouth 2 (two) times a day. 03/20/18   [provider]  naproxen  (NAPROSYN ) 500 MG tablet Take 1 tablet (500 mg total) by mouth 2 (two) times daily with a meal. 07/24/23   Nivia Colon, PA-C  PHOS-NAK 280-160-250 MG PACK Take 2 Packages by mouth 3 (three) times daily. 12/15/18   [provider]  predniSONE  5 MG/5ML solution Take 5 mg by mouth daily. 11/18/18   [provider]  pregabalin  (LYRICA ) 100 MG capsule Take 100 mg by mouth 2 (two) times daily.    [provider]  sulfamethoxazole -trimethoprim  (BACTRIM ) 200-40 MG/5ML suspension Take 10 mLs by mouth every Monday, Wednesday, and Friday. 06/18/19   [provider]  tacrolimus  (PROGRAF ) 1 MG capsule Take 2-3 mg by mouth See admin instructions. Take 3 mg by mouth in the morning and 2 mg at bedtime    [provider]  VYZULTA  0.024 % SOLN Place 1 drop into both eyes at bedtime.    [provider]  fluticasone  (FLOVENT  HFA) 44 MCG/ACT inhaler Inhale 1 puff into the lungs 2 (two) times daily. Patient not taking: No sig reported 03/08/17 03/04/21  Sood, Vineet, MD      Allergies    Patient has no known allergies.    Review of Systems   Review of Systems  Constitutional:  Negative for  chills, fatigue and fever.  HENT:  Negative for congestion.   Respiratory:  Positive for chest tightness and shortness of breath. Negative for cough and wheezing.   Cardiovascular:  Positive for chest pain. Negative for syncope and near-syncope.  Gastrointestinal:  Negative for abdominal pain, constipation, diarrhea, nausea and vomiting.  Genitourinary:  Positive for dysuria. Negative for flank pain.  Musculoskeletal:  Negative for back pain, neck pain and neck stiffness.  Skin:  Negative for rash and wound.  Neurological:  Negative for light-headedness and headaches.  Psychiatric/Behavioral:  Negative for agitation.   All other systems reviewed and are negative.   Physical Exam Updated Vital Signs BP (!) 164/100 (BP Location: Left Arm)   Pulse 79   Temp 98.8 F (37.1 C) (Oral)   Resp 18   SpO2 100%  Physical Exam Vitals and nursing note reviewed.  Constitutional:      General: He is not in acute distress.    Appearance: He is well-developed. He is not ill-appearing, toxic-appearing or diaphoretic.  HENT:     Head: Normocephalic and atraumatic.     Nose: No congestion or rhinorrhea.     Mouth/Throat:     Mouth: Mucous membranes are moist.     Pharynx: No oropharyngeal exudate or posterior oropharyngeal erythema.  Eyes:     Extraocular Movements: Extraocular movements intact.     Conjunctiva/sclera: Conjunctivae normal.     Pupils: Pupils are equal, round, and reactive to light.  Cardiovascular:     Rate and Rhythm: Normal rate and regular rhythm.     Heart sounds: No murmur heard. Pulmonary:     Effort: Pulmonary effort is normal. No respiratory distress.     Breath sounds: Normal breath sounds. No wheezing, rhonchi or rales.  Chest:     Chest wall: No tenderness.  Abdominal:     General: Abdomen is flat.     Palpations: Abdomen is soft.     Tenderness: There is no abdominal tenderness. There is no right CVA tenderness, left CVA tenderness, guarding or rebound.   Musculoskeletal:        General: No swelling or tenderness.     Cervical back: Neck supple. No tenderness.     Right lower leg: Edema present.     Left lower leg: Edema present.  Skin:    General: Skin is warm and dry.     Capillary Refill: Capillary refill takes less than 2 seconds.     Findings: No erythema or rash.  Neurological:     General: No focal deficit present.     Mental Status: He is alert.     Sensory: No sensory deficit.     Motor: No weakness.  Psychiatric:        Mood and Affect: Mood normal.     ED Results / Procedures / Treatments   Labs (  all labs ordered are listed, but only abnormal results are displayed) Labs Reviewed  CBC WITH DIFFERENTIAL/PLATELET - Abnormal; Notable for the following components:      Result Value   Hemoglobin 12.8 (*)    Platelets 101 (*)    Abs Immature Granulocytes 0.18 (*)    All other components within normal limits  COMPREHENSIVE METABOLIC PANEL - Abnormal; Notable for the following components:   Potassium 3.1 (*)    Glucose, Bld 133 (*)    Creatinine, Ser 1.25 (*)    Calcium  8.8 (*)    All other components within normal limits  URINALYSIS, W/ REFLEX TO CULTURE (INFECTION SUSPECTED) - Abnormal; Notable for the following components:   APPearance HAZY (*)    Protein, ur 100 (*)    All other components within normal limits  TROPONIN I (HIGH SENSITIVITY) - Abnormal; Notable for the following components:   Troponin I (High Sensitivity) 122 (*)    All other components within normal limits  TROPONIN I (HIGH SENSITIVITY) - Abnormal; Notable for the following components:   Troponin I (High Sensitivity) 113 (*)    All other components within normal limits  RESP PANEL BY RT-PCR (RSV, FLU A&B, COVID)  [id number]  URINE CULTURE    EKG EKG Interpretation Date/Time:  Tuesday August 13 2023 08:04:12 EST Ventricular Rate:  73 PR Interval:  180 QRS Duration:  114 QT Interval:  407 QTC Calculation: 449 R Axis:   -42  Text  Interpretation: Sinus rhythm Atrial premature complex Abnormal R-wave progression, late transition Left ventricular hypertrophy T wave abnormality Abnormal ECG Confirmed by Garrick Charleston [phone]) on 08/13/2023 8:17:44 AM  Radiology VAS US  LOWER EXTREMITY VENOUS (DVT) (ONLY [facility] & [facility]) Result Date: 08/13/2023  Lower Venous DVT Study Patient Name:  Jon [name]  Date of Exam:   08/13/2023 Medical Rec #: [id number]    Accession #:    [id number] Date of Birth: 16-Jan-1956    Patient Gender: M Patient Age:   70 years Exam Location:  [facility] Procedure:      VAS US  LOWER EXTREMITY VENOUS (DVT) Referring Phys: LONNI SAKAI --------------------------------------------------------------------------------  Indications: Swelling, and Edema.  Risk Factors: Suspected PE obesity. Comparison Study: None. Performing Technologist: Garnette Rockers  Examination Guidelines: A complete evaluation includes B-mode imaging, spectral Doppler, color Doppler, and power Doppler as needed of all accessible portions of each vessel. Bilateral testing is considered an integral part of a complete examination. Limited examinations for reoccurring indications may be performed as noted. The reflux portion of the exam is performed with the patient in reverse Trendelenburg.  +---------+---------------+---------+-----------+----------+--------------+ RIGHT    CompressibilityPhasicitySpontaneityPropertiesThrombus Aging +---------+---------------+---------+-----------+----------+--------------+ CFV      Full           Yes      Yes                                 +---------+---------------+---------+-----------+----------+--------------+ SFJ      Full                                                        +---------+---------------+---------+-----------+----------+--------------+ FV Prox  Full                                                         +---------+---------------+---------+-----------+----------+--------------+  FV Mid   Full                                                        +---------+---------------+---------+-----------+----------+--------------+ FV DistalFull                                                        +---------+---------------+---------+-----------+----------+--------------+ PFV      Full                                                        +---------+---------------+---------+-----------+----------+--------------+ POP      Full           Yes      Yes                                 +---------+---------------+---------+-----------+----------+--------------+ PTV      Full                                                        +---------+---------------+---------+-----------+----------+--------------+ PERO     Full                                                        +---------+---------------+---------+-----------+----------+--------------+   +---------+---------------+---------+-----------+----------+--------------+ LEFT     CompressibilityPhasicitySpontaneityPropertiesThrombus Aging +---------+---------------+---------+-----------+----------+--------------+ CFV      Full           Yes      Yes                                 +---------+---------------+---------+-----------+----------+--------------+ SFJ      Full                                                        +---------+---------------+---------+-----------+----------+--------------+ FV Prox  Full                                                        +---------+---------------+---------+-----------+----------+--------------+ FV Mid   Full                                                        +---------+---------------+---------+-----------+----------+--------------+  FV DistalFull                                                         +---------+---------------+---------+-----------+----------+--------------+ PFV      Full                                                        +---------+---------------+---------+-----------+----------+--------------+ POP      Full           Yes      Yes                                 +---------+---------------+---------+-----------+----------+--------------+ PTV      Full                                                        +---------+---------------+---------+-----------+----------+--------------+ PERO     Full                                                        +---------+---------------+---------+-----------+----------+--------------+     Summary: BILATERAL: - No evidence of deep vein thrombosis seen in the lower extremities, bilaterally. -No evidence of popliteal cyst, bilaterally.   *See table(s) above for measurements and observations. Electronically signed by Norman Serve on 08/13/2023 at 1:30:34 PM.    Final    CT CHEST WO CONTRAST Result Date: 08/13/2023 CLINICAL DATA:  Respiratory illness. Shortness of breath and right-sided chest pain. EXAM: CT CHEST WITHOUT CONTRAST TECHNIQUE: Multidetector CT imaging of the chest was performed following the standard protocol without IV contrast. RADIATION DOSE REDUCTION: This exam was performed according to the departmental dose-optimization program which includes automated exposure control, adjustment of the mA and/or kV according to patient size and/or use of iterative reconstruction technique. COMPARISON:  Chest radiograph dated 08/12/2023. CT dated 07/20/2023. FINDINGS: Evaluation of this exam is limited in the absence of intravenous contrast. Cardiovascular: There is no cardiomegaly or pericardial effusion. Small pericardial effusion. Three vessel coronary vascular calcification. Mild atherosclerotic calcification of the thoracic aorta. No aneurysmal dilatation. The central pulmonary arteries are grossly unremarkable on this  noncontrast CT. Mediastinum/Nodes: No hilar or mediastinal adenopathy. The esophagus is grossly unremarkable. No mediastinal fluid collection. Lungs/Pleura: Similar appearance of right lung nodules measure up to 11 mm in the right middle lobe as the prior CT of 07/20/2023. These nodules have been present dating back to CT of 2020. A 9 mm nodule in the right lower lobe (88/4) however has increased in size since 2020 (previously measuring approximately 4 mm). These favor to represent a benign/post inflammatory process. Follow-up with CT in 6 months recommended. No consolidative changes. There is no pleural effusion or pneumothorax. The central airways are patent. Upper Abdomen: Atrophic kidneys.  Gallstone. Musculoskeletal: No acute osseous  pathology. IMPRESSION: 1. No acute intrathoracic pathology. 2. Several right pulmonary nodules as above, likely benign/postinflammatory. Follow-up with CT in 6 months recommended. 3.  Aortic Atherosclerosis (ICD10-I70.0). Electronically Signed   By: Vanetta Chou M.D.   On: 08/13/2023 10:44   DG Chest 2 View Result Date: 08/12/2023 CLINICAL DATA:  Chest pain and shortness of breath. EXAM: CHEST - 2 VIEW COMPARISON:  July 20, 2023 FINDINGS: The heart size and mediastinal contours are within normal limits. Very mild atelectasis and/or infiltrate is seen within the right lung base. No pleural effusion or pneumothorax is identified. The visualized skeletal structures are unremarkable. IMPRESSION: Very mild right basilar atelectasis and/or infiltrate. Electronically Signed   By: Suzen Dials M.D.   On: 08/12/2023 20:15    Procedures Procedures    Medications Ordered in ED Medications  furosemide  (LASIX ) injection 40 mg (has no administration in time range)  isosorbide  mononitrate (IMDUR ) 24 hr tablet 90 mg (has no administration in time range)  potassium chloride  SA (KLOR-CON  M) CR tablet 40 mEq (has no administration in time range)  acetaminophen  (TYLENOL )  tablet 650 mg (has no administration in time range)    Or  acetaminophen  (TYLENOL ) suppository 650 mg (has no administration in time range)  ondansetron  (ZOFRAN ) tablet 4 mg (has no administration in time range)    Or  ondansetron  (ZOFRAN ) injection 4 mg (has no administration in time range)    ED Course/ Medical Decision Making/ A&P                                 Medical Decision Making Amount and/or Complexity of Data Reviewed Labs: ordered. Radiology: ordered.  Risk Decision regarding hospitalization.    Hawkin [name] is a 68 y.o. male past medical history significant for hypertension, previous small bowel obstruction, atrial fibrillation on Eliquis  therapy, kidney disease status post kidney transplant on immunosuppression, GERD and previous MI who presents with 2 days of chest pain and shortness of breath.  According to patient, his discomfort in his central chest feels similar to MI he has had in the past although he reports he is never needed stenting.  He reports he has not had any fevers, chills, congestion, or cough.  He reports his discomfort is very exertional and less pleuritic.  He reports his worst was a 7 out of 10 and it is now improved as he is resting laying here.  He reports he has had more edema in his legs and has some mild discomfort in his legs.  He reports she has been compliant with all his medications including the suppressive medications and his blood thinners.  He denies any trauma.  He reports no nausea, vomiting, diaphoresis, constipation, or diarrhea but does report some dysuria.  Denies any abdominal pain back pain or flank pains.  Of note, patient went last night to the emergency department to seek care at [facility] but ended up leaving due to the weights.  He then came here for evaluation this morning.  On exam, lungs were clear.  Chest was lightly tender in the right central chest.  Abdomen was nontender with good bowel sounds.  Flanks nontender.  Patient  has intact pulses in all extremities and has legs with mild edema bilaterally he reports it slightly worse than normal.  No focal neurologic deficits initially.  Symmetric smile.  Clear speech.  Pupils symmetric and reactive with normal extraocular movements.  EKG appeared similar  to prior.  No STEMI seen.  Chart was reviewed and it appears that patient had some x-rays and labs done last night.  The x-ray last night did reveal basilar atelectasis versus right-sided infiltrate.  Given his lack of any cough or congestion have less suspicion for pneumonia but due to his immunosuppression, will consider noncontrast CT to further delineate if this is a pneumonia needing antibiotics or not.  Will use no contrast due to his transplant.  His labs last night showed a troponin of 85 which seems more similar to what he has had in the past, will trend today as he reports his discomfort feels similar to prior MI.  Will get repeat labs otherwise and due to his possible infiltrate will get a COVID/flu/RSV.  With the dysuria we will get a urinalysis and due to his leg edema with some discomfort, will get a DVT ultrasound.  Anticipate reassessment after workup to determine disposition.  Is troponin is morning returned at 122 nearly 40 points higher.  Anticipate discussion with cardiology when workup is further along given his complaint that his discomfort feels like prior MI.  11:13 AM CT scan returned showing no evidence of consolidation or pneumonia.  This fits with the patient's lack of congestion or cough.  Pulmonary nodules were still seen similar but slightly larger than prior felt to be likely inflammatory.  He was informed of this.  Given the troponin that is elevated compared to yesterday, will call cardiology to discuss with him as he reports it feels like his MI.  He was negative for COVID/flu/RSV and urinalysis did not show UTI.  Anticipate disposition after discussion with cardiology.  11:44 AM Spoke to  the cardiology him in consultation.  They will see him in person but feel he will need admission.  They will see him to determine if he needs to be mated to [facility] or is okay for [facility].  Anticipate medicine admission given his kidney transplant status.  1:34 PM Cardiology evaluated patient and feels he needs admission to medicine given the transplant and they will plan on likely stress test tomorrow.         Final Clinical Impression(s) / ED Diagnoses Final diagnoses:  Chest pain, unspecified type  Elevated troponin   Clinical Impression: 1. Chest pain, unspecified type   2. Elevated troponin     Disposition: Admit  This note was prepared with assistance of Dragon voice recognition software. Occasional wrong-word or sound-a-like substitutions may have occurred due to the inherent limitations of voice recognition software.     Josey Forcier, Lonni PARAS, MD 08/13/23 [phone]

## 2023-08-13 NOTE — ED Notes (Addendum)
 Pt resting. No needs at this time, awaits bed placement.400cc of urine emptied from urinal, bed locked at its lowest position and Call bell in reach.

## 2023-08-14 DIAGNOSIS — R072 Precordial pain: Secondary | ICD-10-CM | POA: Diagnosis not present

## 2023-08-14 DIAGNOSIS — I5031 Acute diastolic (congestive) heart failure: Secondary | ICD-10-CM

## 2023-08-14 DIAGNOSIS — R079 Chest pain, unspecified: Secondary | ICD-10-CM | POA: Diagnosis not present

## 2023-08-14 DIAGNOSIS — I5033 Acute on chronic diastolic (congestive) heart failure: Secondary | ICD-10-CM

## 2023-08-14 DIAGNOSIS — R7989 Other specified abnormal findings of blood chemistry: Secondary | ICD-10-CM | POA: Diagnosis not present

## 2023-08-14 LAB — COMPREHENSIVE METABOLIC PANEL
ALT: 17 U/L (ref 0–44)
AST: 24 U/L (ref 15–41)
Albumin: 3.7 g/dL (ref 3.5–5.0)
Alkaline Phosphatase: 78 U/L (ref 38–126)
Anion gap: 7 (ref 5–15)
BUN: 15 mg/dL (ref 8–23)
CO2: 24 mmol/L (ref 22–32)
Calcium: 9.2 mg/dL (ref 8.9–10.3)
Chloride: 109 mmol/L (ref 98–111)
Creatinine, Ser: 1.36 mg/dL — ABNORMAL HIGH (ref 0.61–1.24)
GFR, Estimated: 58 mL/min — ABNORMAL LOW (ref >60)
Glucose, Bld: 138 mg/dL — ABNORMAL HIGH (ref 70–99)
Potassium: 3.8 mmol/L (ref 3.5–5.1)
Sodium: 140 mmol/L (ref 135–145)
Total Bilirubin: 0.9 mg/dL (ref 0.0–1.2)
Total Protein: 6.7 g/dL (ref 6.5–8.1)

## 2023-08-14 LAB — CBC
HCT: 43.8 % (ref 39.0–52.0)
Hemoglobin: 13.6 g/dL (ref 13.0–17.0)
MCH: 26.8 pg (ref 26.0–34.0)
MCHC: 31.1 g/dL (ref 30.0–36.0)
MCV: 86.4 fL (ref 80.0–100.0)
Platelets: 107 10*3/uL — ABNORMAL LOW (ref 150–400)
RBC: 5.07 MIL/uL (ref 4.22–5.81)
RDW: 14.3 % (ref 11.5–15.5)
WBC: 4.2 10*3/uL (ref 4.0–10.5)
nRBC: 0 % (ref 0.0–0.2)

## 2023-08-14 LAB — LIPID PANEL
Cholesterol: 123 mg/dL (ref 0–200)
HDL: 35 mg/dL — ABNORMAL LOW (ref >40)
LDL Cholesterol: 60 mg/dL (ref 0–99)
Total CHOL/HDL Ratio: 3.5 {ratio}
Triglycerides: 139 mg/dL (ref <150)
VLDL: 28 mg/dL (ref 0–40)

## 2023-08-14 LAB — URINE CULTURE: Culture: NO GROWTH

## 2023-08-14 LAB — GLUCOSE, CAPILLARY: Glucose-Capillary: 132 mg/dL — ABNORMAL HIGH (ref 70–99)

## 2023-08-14 MED ORDER — ISOSORBIDE MONONITRATE ER 60 MG PO TB24
90.0000 mg | ORAL_TABLET | Freq: Every day | ORAL | Status: DC
  Administered 2023-08-14 – 2023-08-16 (×3): 90 mg via ORAL
  Filled 2023-08-14 (×3): qty 1

## 2023-08-14 NOTE — ED Notes (Addendum)
 [facility] called and transport set up for patient to be at [place] at 11am for stress test.  Patient to have no caffeine after 1800 today Patient to be NPO starting at 6am 11/16  [facility] needs to be updated if patient gets inpatient bed

## 2023-08-14 NOTE — Progress Notes (Signed)
  Stress test has been rescheduled for tomorrow given issue with transportation. Will go ahead and given patient a diet for take and will make NPO again at midnight. Transport has been set up for tomorrow.  Lanora Reveron E Rohil Lesch, PA-C 08/14/2023 1:47 PM

## 2023-08-14 NOTE — ED Notes (Signed)
 Spoke with Andy Bannister at [facility] and gave update of pt inpatient bed assignment
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
## 2023-08-14 NOTE — Progress Notes (Signed)
 Rounding Note    Patient Name: Jon [name] Date of Encounter: 08/14/2023  [facility] Cardiologist: Dr. Glena Landau  Subjective   No acute overnight events. Patient is feeling better today compared to yesterday. He still has a little chest pain and shortness of breath but states it is much better than yesterday. He seems to have responded well to the dose of IV Lasix . He had 800 cc of documented urinary output yesterday but suspect he may of had more has he has 2 male urinals that are at least partially full in his room.  Inpatient Medications    Scheduled Meds:  amLODipine   10 mg Oral QHS   apixaban   5 mg Oral BID   atorvastatin   20 mg Oral QHS   hydrALAZINE   100 mg Oral Q8H   isosorbide  mononitrate  60 mg Oral Daily   mycophenolate   1,000 mg Oral BID   predniSONE   5 mg Oral Daily   pregabalin   200 mg Oral TID   tacrolimus   3 mg Oral Daily   And   tacrolimus   2 mg Oral QHS   Continuous Infusions:  PRN Meds: acetaminophen  **OR** acetaminophen , ondansetron  **OR** ondansetron  (ZOFRAN ) IV   Vital Signs    Vitals:   08/13/23 2211 08/13/23 2213 08/14/23 0241 08/14/23 0703  BP: (!) 145/78 (!) 145/78 (!) 157/97 (!) 148/88  Pulse:   79 83  Resp:   20 18  Temp:   97.8 F (36.6 C) 97.7 F (36.5 C)  TempSrc:   Oral Oral  SpO2:   97% 97%    Intake/Output Summary (Last 24 hours) at 08/14/2023 0707 Last data filed at 08/13/2023 1629 Gross per 24 hour  Intake 360 ml  Output 800 ml  Net -440 ml      07/20/2023   10:22 PM 03/07/2023    9:19 AM 01/05/2023    9:06 PM  Last 3 Weights  Weight (lbs) 249 lb 249 lb 245 lb 9.5 oz  Weight (kg) 112.946 kg 112.946 kg 111.4 kg      Telemetry    Normal sinus rhythm with frequent PACs/ PVCs. Some ventricular couplets noted. Brief runs of PAT vs PAF. Baseline rates in the 70s to 80s. - Personally Reviewed  ECG    No new ECG tracing today. - Personally Reviewed  Physical Exam   GEN: No acute distress.   Neck: No  JVD. Cardiac: RRR. No murmurs, rubs, or gallops.  Respiratory: Clear to auscultation bilaterally. No wheezes, rhonchi, or rales. GI: Soft, non-distended, and non-tender.  MS: Trace lower extremity edema bilaterally (improved from yesterday), No deformity. Neuro:  No focal deficits. Psych: Normal affect. Responds appropriately.   Labs    High Sensitivity Troponin:   Recent Labs  Lab 08/12/23 1850 08/13/23 0804 08/13/23 1042  TROPONINIHS 85* 122* 113*     Chemistry Recent Labs  Lab 08/12/23 1850 08/13/23 1042  NA 140 138  K 3.8 3.1*  CL 106 110  CO2 23 23  GLUCOSE 155* 133*  BUN 11 12  CREATININE 1.37* 1.25*  CALCIUM  9.3 8.8*  PROT 6.9 6.6  ALBUMIN  3.9 3.8  AST 23 20  ALT 16 16  ALKPHOS 80 68  BILITOT 0.8 0.9  GFRNONAA 57* >60  ANIONGAP 11 5    Lipids No results for input(s): "CHOL", "TRIG", "HDL", "LABVLDL", "LDLCALC", "CHOLHDL" in the last 168 hours.  Hematology Recent Labs  Lab 08/12/23 1850 08/13/23 1042  WBC 4.4 4.4  RBC 5.14 4.75  HGB 13.7  12.8*  HCT 44.5 41.2  MCV 86.6 86.7  MCH 26.7 26.9  MCHC 30.8 31.1  RDW 14.4 14.3  PLT 98* 101*   Thyroid No results for input(s): "TSH", "FREET4" in the last 168 hours.  BNP Recent Labs  Lab 08/12/23 1850  BNP 210.9*    DDimer No results for input(s): "DDIMER" in the last 168 hours.   Radiology    VAS US  LOWER EXTREMITY VENOUS (DVT) (ONLY [facility] & [facility]) Result Date: 08/13/2023  Lower Venous DVT Study Patient Name:  MANDEEP KOSTAS  Date of Exam:   08/13/2023 Medical Rec #: [id number]    Accession #:    [id number] Date of Birth: 1955-12-09    Patient Gender: M Patient Age:   68 years Exam Location:  [facility] Procedure:      VAS US  LOWER EXTREMITY VENOUS (DVT) Referring Phys: Paris Bolds --------------------------------------------------------------------------------  Indications: Swelling, and Edema.  Risk Factors: Suspected PE obesity. Comparison Study: None. Performing Technologist: Estanislao Heimlich   Examination Guidelines: A complete evaluation includes B-mode imaging, spectral Doppler, color Doppler, and power Doppler as needed of all accessible portions of each vessel. Bilateral testing is considered an integral part of a complete examination. Limited examinations for reoccurring indications may be performed as noted. The reflux portion of the exam is performed with the patient in reverse Trendelenburg.  +---------+---------------+---------+-----------+----------+--------------+ RIGHT    CompressibilityPhasicitySpontaneityPropertiesThrombus Aging +---------+---------------+---------+-----------+----------+--------------+ CFV      Full           Yes      Yes                                 +---------+---------------+---------+-----------+----------+--------------+ SFJ      Full                                                        +---------+---------------+---------+-----------+----------+--------------+ FV Prox  Full                                                        +---------+---------------+---------+-----------+----------+--------------+ FV Mid   Full                                                        +---------+---------------+---------+-----------+----------+--------------+ FV DistalFull                                                        +---------+---------------+---------+-----------+----------+--------------+ PFV      Full                                                        +---------+---------------+---------+-----------+----------+--------------+  POP      Full           Yes      Yes                                 +---------+---------------+---------+-----------+----------+--------------+ PTV      Full                                                        +---------+---------------+---------+-----------+----------+--------------+ PERO     Full                                                         +---------+---------------+---------+-----------+----------+--------------+   +---------+---------------+---------+-----------+----------+--------------+ LEFT     CompressibilityPhasicitySpontaneityPropertiesThrombus Aging +---------+---------------+---------+-----------+----------+--------------+ CFV      Full           Yes      Yes                                 +---------+---------------+---------+-----------+----------+--------------+ SFJ      Full                                                        +---------+---------------+---------+-----------+----------+--------------+ FV Prox  Full                                                        +---------+---------------+---------+-----------+----------+--------------+ FV Mid   Full                                                        +---------+---------------+---------+-----------+----------+--------------+ FV DistalFull                                                        +---------+---------------+---------+-----------+----------+--------------+ PFV      Full                                                        +---------+---------------+---------+-----------+----------+--------------+ POP      Full           Yes      Yes                                 +---------+---------------+---------+-----------+----------+--------------+  PTV      Full                                                        +---------+---------------+---------+-----------+----------+--------------+ PERO     Full                                                        +---------+---------------+---------+-----------+----------+--------------+     Summary: BILATERAL: - No evidence of deep vein thrombosis seen in the lower extremities, bilaterally. -No evidence of popliteal cyst, bilaterally.   *See table(s) above for measurements and observations. Electronically signed by Delaney Fearing on 08/13/2023 at 1:30:34 PM.     Final    CT CHEST WO CONTRAST Result Date: 08/13/2023 CLINICAL DATA:  Respiratory illness. Shortness of breath and right-sided chest pain. EXAM: CT CHEST WITHOUT CONTRAST TECHNIQUE: Multidetector CT imaging of the chest was performed following the standard protocol without IV contrast. RADIATION DOSE REDUCTION: This exam was performed according to the departmental dose-optimization program which includes automated exposure control, adjustment of the mA and/or kV according to patient size and/or use of iterative reconstruction technique. COMPARISON:  Chest radiograph dated 08/12/2023. CT dated 07/20/2023. FINDINGS: Evaluation of this exam is limited in the absence of intravenous contrast. Cardiovascular: There is no cardiomegaly or pericardial effusion. Small pericardial effusion. Three vessel coronary vascular calcification. Mild atherosclerotic calcification of the thoracic aorta. No aneurysmal dilatation. The central pulmonary arteries are grossly unremarkable on this noncontrast CT. Mediastinum/Nodes: No hilar or mediastinal adenopathy. The esophagus is grossly unremarkable. No mediastinal fluid collection. Lungs/Pleura: Similar appearance of right lung nodules measure up to 11 mm in the right middle lobe as the prior CT of 07/20/2023. These nodules have been present dating back to CT of 2020. A 9 mm nodule in the right lower lobe (88/4) however has increased in size since 2020 (previously measuring approximately 4 mm). These favor to represent a benign/post inflammatory process. Follow-up with CT in 6 months recommended. No consolidative changes. There is no pleural effusion or pneumothorax. The central airways are patent. Upper Abdomen: Atrophic kidneys.  Gallstone. Musculoskeletal: No acute osseous pathology. IMPRESSION: 1. No acute intrathoracic pathology. 2. Several right pulmonary nodules as above, likely benign/postinflammatory. Follow-up with CT in 6 months recommended. 3.  Aortic Atherosclerosis  (ICD10-I70.0). Electronically Signed   By: Angus Bark M.D.   On: 08/13/2023 10:44   DG Chest 2 View Result Date: 08/12/2023 CLINICAL DATA:  Chest pain and shortness of breath. EXAM: CHEST - 2 VIEW COMPARISON:  July 20, 2023 FINDINGS: The heart size and mediastinal contours are within normal limits. Very mild atelectasis and/or infiltrate is seen within the right lung base. No pleural effusion or pneumothorax is identified. The visualized skeletal structures are unremarkable. IMPRESSION: Very mild right basilar atelectasis and/or infiltrate. Electronically Signed   By: Virgle Grime M.D.   On: 08/12/2023 20:15    Cardiac Studies   Echocardiogram 05/30/2023: Impressions: 1. HTN heart disease. Left ventricular ejection fraction, by estimation,  is 60 to 65%. The left ventricle has normal function. The left ventricle  has no regional wall motion abnormalities. There is severe concentric left  ventricular hypertrophy. Left  ventricular diastolic parameters are consistent with Grade I diastolic  dysfunction (impaired relaxation).   2. Right ventricular systolic function is normal. The right ventricular  size is normal.   3. Left atrial size was mildly dilated.   4. The mitral valve is normal in structure. No evidence of mitral valve  regurgitation. Moderate mitral annular calcification.   5. The aortic valve was not well visualized. Aortic valve regurgitation  is trivial. Aortic valve sclerosis/calcification is present, without any  evidence of aortic stenosis.   6. The inferior vena cava is dilated in size with >50% respiratory  variability, suggesting right atrial pressure of 8 mmHg.   Patient Profile     68 y.o. male with a history of  minimal non-obstructive CAD noted on cardiac catheterization in 02/2017, paroxysmal atrial fibrillation on Eliquis , hypertension, hyperlipidemia, ESRD s/p renal transplant in 06/2017 and now CKD stage III, GERD, and shingles in 08/2021 with  postherpetic neuropathy  being seen 08/13/2023 for the evaluation of chest pain and mildly elevated troponin at the request of Dr. Manus Sellers.   Assessment & Plan    Chest Pain Non-Obstructive CAD LHC in 02/2017 prior to renal transplant showed only minimal disease with minimal luminal irregularities of LAD, LCX, and RCA. He now presents with chest pain and shortness of breath that started suddenly on 1/13. EKG shows no acute ischemic changes. High-sensitivity troponin 85 >> 122 >> 113.  - He continues to have a little chest pain and shortness of breath this morning but much improved after the dose of IV Lasix . Symptoms may just be due to volume overload.  - Will increase Imdur  to 90mg  daily. He will get first dose of this today. - Not on aspirin  at home given need for full anticoagulation. - Continue statin.  - Troponin elevation is not consistent with ACS.Therefore, Heparin  was not started. Plan is for Lexiscan  Myoview  today. Further recommendations following this.   Possible Diastolic CHF Patient presented acute onset of chest pain and shortness of breath as above. However, he also reported lower extremity edema over the last couple of weeks. BNP mildly elevated in the 200s. No overt edema on chest CT. He was given one dose of IV Lasix  40 yesterday with 800 cc of urinary output but may not be fully documented. - He still feels a little short of breath but much better than yesterday. Edema improved as well. - Can likely start PO Lasix  after stress test today. He has Lasix  40mg  twice daily as needed listed under his PTA medications and he states he takes this about 3 times a week. Suspect he is going to need this on more routine basis.   Paroxysmal Atrial Fibrillation Telemetry shows normal sinus rhythm with frequent PACs/ PVCs and very brief episodes of PAT vs PAF. - Continue Coreg  25mg  twice daily. Currently being held for stress test today but can be resumed afterwards. - On chronic  anticoagulation with Eliquis  at home. Will hold for now in case stress test is abnormal. Last dose of this was evening of 08/13/2023.   Hypertension BP mildly elevated. - Home medications: Amlodipine  10mg  daily, Coreg  25mg  twice daily , Hydralazine  100mg  three times daily, and Imdur  60mg  daily.  - Will increase Imdur  to 90mg  daily. Otherwise, continue home medications.  - Of note, Coreg  is currently being held for stress test later today but can be resumed afterwards.   Hyperlipidemia - Continue Lipitor 20mg  daily. - Will recheck fasting lipid panel this morning.   ESRD s/p  Renal Transplant  History of renal transplant in 2018. Now with CKD stage III. Baseline creatinine around 1.3 to 1.5. Stable at 1.25 on admission. - Creatinine stable at 1.36 today. - Continue anti-rejection medications per primary team.  For questions or updates, please contact [facility] Please consult [email or web address] for contact info under        Signed, Keyonni Percival E Scotti Motter, PA-C  08/14/2023, 7:07 AM

## 2023-08-14 NOTE — ED Notes (Signed)
 Hydralazine  held. BP 106/74

## 2023-08-15 ENCOUNTER — Observation Stay (HOSPITAL_COMMUNITY): Payer: 59

## 2023-08-15 DIAGNOSIS — I480 Paroxysmal atrial fibrillation: Secondary | ICD-10-CM | POA: Diagnosis not present

## 2023-08-15 DIAGNOSIS — I5031 Acute diastolic (congestive) heart failure: Secondary | ICD-10-CM | POA: Diagnosis not present

## 2023-08-15 DIAGNOSIS — R079 Chest pain, unspecified: Secondary | ICD-10-CM | POA: Diagnosis not present

## 2023-08-15 LAB — CBC WITH DIFFERENTIAL/PLATELET
Abs Immature Granulocytes: 0.05 10*3/uL (ref 0.00–0.07)
Basophils Absolute: 0 10*3/uL (ref 0.0–0.1)
Basophils Relative: 1 %
Eosinophils Absolute: 0.1 10*3/uL (ref 0.0–0.5)
Eosinophils Relative: 2 %
HCT: 40.1 % (ref 39.0–52.0)
Hemoglobin: 12.4 g/dL — ABNORMAL LOW (ref 13.0–17.0)
Immature Granulocytes: 1 %
Lymphocytes Relative: 29 %
Lymphs Abs: 1.2 10*3/uL (ref 0.7–4.0)
MCH: 26.6 pg (ref 26.0–34.0)
MCHC: 30.9 g/dL (ref 30.0–36.0)
MCV: 86.1 fL (ref 80.0–100.0)
Monocytes Absolute: 0.7 10*3/uL (ref 0.1–1.0)
Monocytes Relative: 17 %
Neutro Abs: 2.1 10*3/uL (ref 1.7–7.7)
Neutrophils Relative %: 50 %
Platelets: 103 10*3/uL — ABNORMAL LOW (ref 150–400)
RBC: 4.66 MIL/uL (ref 4.22–5.81)
RDW: 14.3 % (ref 11.5–15.5)
WBC: 4.2 10*3/uL (ref 4.0–10.5)
nRBC: 0 % (ref 0.0–0.2)

## 2023-08-15 LAB — NM MYOCAR MULTI W/SPECT W/WALL MOTION / EF
Base ST Depression (mm): 0 mm
Estimated workload: 0
Exercise duration (min): 5 min
Exercise duration (sec): 32 s
MPHR: 155 {beats}/min
Peak HR: 104 {beats}/min
Percent HR: 67 %
Rest HR: 78 {beats}/min
ST Depression (mm): 0 mm

## 2023-08-15 LAB — GLUCOSE, CAPILLARY
Glucose-Capillary: 115 mg/dL — ABNORMAL HIGH (ref 70–99)
Glucose-Capillary: 110 mg/dL — ABNORMAL HIGH (ref 70–99)

## 2023-08-15 LAB — BASIC METABOLIC PANEL
Anion gap: 9 (ref 5–15)
BUN: 24 mg/dL — ABNORMAL HIGH (ref 8–23)
CO2: 22 mmol/L (ref 22–32)
Calcium: 9 mg/dL (ref 8.9–10.3)
Chloride: 105 mmol/L (ref 98–111)
Creatinine, Ser: 1.66 mg/dL — ABNORMAL HIGH (ref 0.61–1.24)
GFR, Estimated: 45 mL/min — ABNORMAL LOW (ref >60)
Glucose, Bld: 148 mg/dL — ABNORMAL HIGH (ref 70–99)
Potassium: 3.3 mmol/L — ABNORMAL LOW (ref 3.5–5.1)
Sodium: 136 mmol/L (ref 135–145)

## 2023-08-15 MED ORDER — REGADENOSON 0.4 MG/5ML IV SOLN
0.4000 mg | Freq: Once | INTRAVENOUS | Status: AC
  Administered 2023-08-15: 0.4 mg via INTRAVENOUS
  Filled 2023-08-15: qty 5

## 2023-08-15 MED ORDER — POTASSIUM CHLORIDE CRYS ER 20 MEQ PO TBCR
40.0000 meq | EXTENDED_RELEASE_TABLET | Freq: Once | ORAL | Status: AC
  Administered 2023-08-15: 40 meq via ORAL
  Filled 2023-08-15: qty 2

## 2023-08-15 MED ORDER — TECHNETIUM TC 99M TETROFOSMIN IV KIT
32.9000 | PACK | Freq: Once | INTRAVENOUS | Status: AC | PRN
  Administered 2023-08-15: 32.9 via INTRAVENOUS

## 2023-08-15 MED ORDER — TECHNETIUM TC 99M TETROFOSMIN IV KIT
10.4000 | PACK | Freq: Once | INTRAVENOUS | Status: AC | PRN
  Administered 2023-08-15: 10.4 via INTRAVENOUS

## 2023-08-15 NOTE — TOC Initial Note (Signed)
Transition of Care [facility]) - Initial/Assessment Note   Patient Details  Name: Jon [name] MRN: [id number] Date of Birth: 24-Dec-1955  Transition of Care [facility]) CM/SW Contact:    Ewing Schlein, LCSW Phone Number: 08/15/2023, 10:34 AM  Clinical Narrative: CSW met with patient to discuss SDOH screening. Patient agreeable to having food pantry and utility assistance information added to AVS. CSW added resources to AVS.  Expected Discharge Plan: Home/Self Care Barriers to Discharge: Continued Medical Work up  Patient Goals and CMS Choice Patient states their goals for this hospitalization and ongoing recovery are:: Get food and utilities assistance information Choice offered to / list presented to : NA  Expected Discharge Plan and Services In-house Referral: Clinical Social Work Post Acute Care Choice: NA Living arrangements for the past 2 months: Single Family Home           DME Arranged: N/A DME Agency: NA  Prior Living Arrangements/Services Living arrangements for the past 2 months: Single Family Home Lives with:: Self Patient language and need for interpreter reviewed:: Yes Do you feel safe going back to the place where you live?: Yes      Need for Family Participation in Patient Care: No (Comment) Care giver support system in place?: Yes (comment) Criminal Activity/Legal Involvement Pertinent to Current Situation/Hospitalization: No - Comment as needed  Activities of Daily Living ADL Screening (condition at time of admission) Independently performs ADLs?: Yes (appropriate for developmental age) Is the patient deaf or have difficulty hearing?: No Does the patient have difficulty seeing, even when wearing glasses/contacts?: No Does the patient have difficulty concentrating, remembering, or making decisions?: No  Emotional Assessment Appearance:: Appears stated age Attitude/Demeanor/Rapport: Engaged Affect (typically observed): Accepting, Appropriate Orientation: : Oriented to  Self, Oriented to Place, Oriented to  Time, Oriented to Situation Alcohol / Substance Use: Not Applicable Psych Involvement: No (comment)  Admission diagnosis:  Elevated troponin [R79.89] Chest pain [R07.9] Chest pain, unspecified type [R07.9] Patient Active Problem List   Diagnosis Date Noted   Acute diastolic (congestive) heart failure (HCC) 08/14/2023   Chest pain 08/13/2023   Hypokalemia 08/13/2023   CAD (coronary artery disease) 08/13/2023   History of shingles 01/04/2023   Acute renal failure superimposed on stage 3a chronic kidney disease (HCC) 01/03/2023   Paroxysmal atrial fibrillation (HCC) 01/03/2023   Elevated troponin 01/02/2023   Post herpetic neuralgia 11/05/2022   Acute-on-chronic kidney injury (HCC) 03/04/2021   COVID-19 virus infection 03/04/2021   Pulmonary nodule 05/01/2019   SBO (small bowel obstruction) (HCC) 04/17/2019   Kidney transplant recipient 08/26/2017   Red blood cell antibody positive 07/16/2017   Mixed hyperlipidemia 05/09/2017   Gastroesophageal reflux disease without esophagitis 05/09/2017   Coronary artery disease involving native coronary artery of native heart without angina pectoris 05/09/2017   Cough 01/24/2017   Anemia due to chronic kidney disease    Thrombocytopenia (HCC)    Encounter to establish care 10/16/2016   Right ankle pain 09/07/2016   HTN (hypertension) 05/07/2012   PCP:  Loura Back, NP Pharmacy:   [facility] - [address], [facility] [place] [address] Phone: [phone] Fax: [phone]  [id number] - [place], [place] - [address] AT [facility] [address] [place] [address] Phone: [phone] Fax: [phone]  Social Drivers of Health (SDOH) Social History: SDOH Screenings   Food Insecurity: Food Insecurity Present (08/14/2023)  Housing: Low Risk  (08/14/2023)  Transportation Needs: No  Transportation  Needs (08/14/2023)  Utilities: At Risk (08/14/2023)  Depression (PHQ2-9): Low Risk  (05/01/2019)  Social Connections: Moderately Integrated (08/14/2023)  Tobacco Use: Low Risk  (08/13/2023)   SDOH Interventions: Food Insecurity Interventions: Inpatient TOC, Other (Comment) (Food pantry resources added to AVS.) Utilities Interventions: Inpatient TOC, Other (Comment) (Utilities assistance information added to AVS.)  Readmission Risk Interventions    01/04/2023    3:23 PM  Readmission Risk Prevention Plan  Transportation Screening Complete  PCP or Specialist Appt within 5-7 Days Complete  Home Care Screening Complete  Medication Review (RN CM) Complete

## 2023-08-15 NOTE — Care Management Obs Status (Signed)
MEDICARE OBSERVATION STATUS NOTIFICATION   Patient Details  Name: Jon [name] MRN: [id number] Date of Birth: [birth year]   Medicare Observation Status Notification Given:  Yes    Ewing Schlein, LCSW 08/15/2023, 10:18 AM

## 2023-08-15 NOTE — Plan of Care (Signed)
  Problem: Cardiac: Goal: Ability to achieve and maintain adequate cardiovascular perfusion will improve Outcome: Progressing   Problem: Clinical Measurements: Goal: Diagnostic test results will improve Outcome: Progressing Goal: Respiratory complications will improve Outcome: Progressing Goal: Cardiovascular complication will be avoided Outcome: Progressing

## 2023-08-15 NOTE — Progress Notes (Signed)
Mobility Specialist - Progress Note   08/15/23 1010  Mobility  Activity Ambulated independently in hallway;Ambulated independently to bathroom  Level of Assistance Independent  Assistive Device None  Distance Ambulated (ft) 200 ft  Activity Response Tolerated well  Mobility Referral Yes  Mobility visit 1 Mobility  Mobility Specialist Start Time (ACUTE ONLY) 1002  Mobility Specialist Stop Time (ACUTE ONLY) 1010  Mobility Specialist Time Calculation (min) (ACUTE ONLY) 8 min   Pt received in bed and agreeable to mobility. Pt fatigued throughout session. No complaints during session. Pt to bed after session with all needs met.    During mobility: 94 HR Post-mobility: 81 HR  [facility] Paediatric nurse
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
## 2023-08-15 NOTE — Progress Notes (Signed)
Rounding Note    Patient Name: Jon [name] Date of Encounter: 08/15/2023  [facility] Cardiologist: Dr. Sharyn Lull  Subjective   No chest pain overnight-slept better. He was again net negative about 550, overall 1L negative. Plan for myoview today since transportation was not arranged yesterday. Creatinine trended up today to 1.66. Potassium 3.3.   Inpatient Medications    Scheduled Meds:  amLODipine  10 mg Oral QHS   atorvastatin  20 mg Oral QHS   hydrALAZINE  100 mg Oral Q8H   isosorbide mononitrate  90 mg Oral Daily   mycophenolate  1,000 mg Oral BID   predniSONE  5 mg Oral Daily   pregabalin  200 mg Oral TID   tacrolimus  3 mg Oral Daily   And   tacrolimus  2 mg Oral QHS   Continuous Infusions:  PRN Meds: acetaminophen **OR** acetaminophen, ondansetron **OR** ondansetron (ZOFRAN) IV   Vital Signs    Vitals:   08/14/23 1644 08/14/23 2110 08/15/23 0538 08/15/23 0837  BP:  126/89 114/85 (!) 143/94  Pulse:  76 71 74  Resp:  20 20 16   Temp:  98.3 F (36.8 C) 98.2 F (36.8 C) 97.8 F (36.6 C)  TempSrc:  Oral Oral Oral  SpO2:  96% 96% 95%  Weight:      Height: 6' (1.829 m)       Intake/Output Summary (Last 24 hours) at 08/15/2023 1053 Last data filed at 08/15/2023 0900 Gross per 24 hour  Intake 0 ml  Output --  Net 0 ml      08/14/2023    4:31 PM 07/20/2023   10:22 PM 03/07/2023    9:19 AM  Last 3 Weights  Weight (lbs) 249 lb 249 lb 249 lb  Weight (kg) 112.946 kg 112.946 kg 112.946 kg      Telemetry    Sinus rhythm with PVC's, artifact- personally reviewed  ECG    N/A  Physical Exam   GEN: No acute distress.   Neck: No JVD. Cardiac: RRR. No murmurs, rubs, or gallops.  Respiratory: Clear to auscultation bilaterally. No wheezes, rhonchi, or rales. GI: Soft, non-distended, and non-tender.  MS: Trace lower extremity edema bilaterally (improved from yesterday), No deformity. Neuro:  No focal deficits. Psych: Normal affect. Responds  appropriately.   Labs    High Sensitivity Troponin:   Recent Labs  Lab 08/12/23 1850 08/13/23 0804 08/13/23 1042  TROPONINIHS 85* 122* 113*     Chemistry Recent Labs  Lab 08/12/23 1850 08/13/23 1042 08/14/23 0714 08/15/23 0346  NA 140 138 140 136  K 3.8 3.1* 3.8 3.3*  CL 106 110 109 105  CO2 23 23 24 22   GLUCOSE 155* 133* 138* 148*  BUN 11 12 15  24*  CREATININE 1.37* 1.25* 1.36* 1.66*  CALCIUM 9.3 8.8* 9.2 9.0  PROT 6.9 6.6 6.7  --   ALBUMIN 3.9 3.8 3.7  --   AST 23 20 24   --   ALT 16 16 17   --   ALKPHOS 80 68 78  --   BILITOT 0.8 0.9 0.9  --   GFRNONAA 57* >60 58* 45*  ANIONGAP 11 5 7 9     Lipids  Recent Labs  Lab 08/14/23 0714  CHOL 123  TRIG 139  HDL 35*  LDLCALC 60  CHOLHDL 3.5    Hematology Recent Labs  Lab 08/13/23 1042 08/14/23 0714 08/15/23 0346  WBC 4.4 4.2 4.2  RBC 4.75 5.07 4.66  HGB 12.8* 13.6 12.4*  HCT 41.2 43.8 40.1  MCV 86.7 86.4 86.1  MCH 26.9 26.8 26.6  MCHC 31.1 31.1 30.9  RDW 14.3 14.3 14.3  PLT 101* 107* 103*   Thyroid No results for input(s): "TSH", "FREET4" in the last 168 hours.  BNP Recent Labs  Lab 08/12/23 1850  BNP 210.9*    DDimer No results for input(s): "DDIMER" in the last 168 hours.   Radiology    No results found.   Cardiac Studies   Echocardiogram 05/30/2023: Impressions: 1. HTN heart disease. Left ventricular ejection fraction, by estimation,  is 60 to 65%. The left ventricle has normal function. The left ventricle  has no regional wall motion abnormalities. There is severe concentric left  ventricular hypertrophy. Left  ventricular diastolic parameters are consistent with Grade I diastolic  dysfunction (impaired relaxation).   2. Right ventricular systolic function is normal. The right ventricular  size is normal.   3. Left atrial size was mildly dilated.   4. The mitral valve is normal in structure. No evidence of mitral valve  regurgitation. Moderate mitral annular calcification.   5. The  aortic valve was not well visualized. Aortic valve regurgitation  is trivial. Aortic valve sclerosis/calcification is present, without any  evidence of aortic stenosis.   6. The inferior vena cava is dilated in size with >50% respiratory  variability, suggesting right atrial pressure of 8 mmHg.   Patient Profile     68 y.o. male with a history of  minimal non-obstructive CAD noted on cardiac catheterization in 02/2017, paroxysmal atrial fibrillation on Eliquis, hypertension, hyperlipidemia, ESRD s/p renal transplant in 06/2017 and now CKD stage III, GERD, and shingles in 08/2021 with postherpetic neuropathy  being seen 08/13/2023 for the evaluation of chest pain and mildly elevated troponin at the request of Dr. Rush Landmark.   Assessment & Plan    Chest Pain Non-Obstructive CAD LHC in 02/2017 prior to renal transplant showed only minimal disease with minimal luminal irregularities of LAD, LCX, and RCA. He now presents with chest pain and shortness of breath that started suddenly on 1/13. EKG shows no acute ischemic changes. High-sensitivity troponin 85 >> 122 >> 113.  - He continues to have a little chest pain and shortness of breath this morning but much improved after the dose of IV Lasix. Symptoms may just be due to volume overload.  - Will increase Imdur to 90mg  daily. He will get first dose of this today. - Not on aspirin at home given need for full anticoagulation. - Continue statin.  - Troponin elevation is not consistent with ACS.Therefore, Heparin was not started. Plan is for [employer] today. Further recommendations following this.   Possible Diastolic CHF Patient presented acute onset of chest pain and shortness of breath as above. However, he also reported lower extremity edema over the last couple of weeks. BNP mildly elevated in the 200s. No overt edema on chest CT. He was given one dose of IV Lasix 40 yesterday with 800 cc of urinary output but may not be fully documented. - About  1L negative- creatinine trending up, hold diuretics.   Paroxysmal Atrial Fibrillation Telemetry shows normal sinus rhythm with frequent PACs/ PVCs and very brief episodes of PAT vs PAF. - Continue Coreg 25mg  twice daily. Currently being held for stress test today but can be resumed afterwards. - On chronic anticoagulation with Eliquis at home. Will hold for now in case stress test is abnormal. Last dose of this was evening of 08/13/2023.   Hypertension BP  mildly elevated. - Home medications: Amlodipine 10mg  daily, Coreg 25mg  twice daily , Hydralazine 100mg  three times daily, and Imdur 60mg  daily.  - Will increase Imdur to 90mg  daily. Otherwise, continue home medications.  - Of note, Coreg is currently being held for stress test later today but can be resumed afterwards.   Hyperlipidemia - Continue Lipitor 20mg  daily. - Will recheck fasting lipid panel this morning.   ESRD s/p Renal Transplant  History of renal transplant in 2018. Now with CKD stage III. Baseline creatinine around 1.3 to 1.5. Stable at 1.25 on admission. - Creatinine stable at 1.36 today. - Continue anti-rejection medications per primary team. - Replete potassium - 3.3 today, will give 40 MEQ x 1  For questions or updates, please contact [facility] Please consult [email or web address] for contact info under   Chrystie Nose, MD, Milagros Loll  [place]  [facility]  Medical Director of the [facility]  Cardiovascular Risk Reduction Clinic Diplomate of the American Board of Clinical Lipidology Attending Cardiologist  Direct Dial: [phone]  Fax: [phone]  Website:  [email or web address]  Chrystie Nose, MD  08/15/2023, 10:53 AM

## 2023-08-16 ENCOUNTER — Observation Stay (HOSPITAL_BASED_OUTPATIENT_CLINIC_OR_DEPARTMENT_OTHER): Payer: 59

## 2023-08-16 DIAGNOSIS — I5031 Acute diastolic (congestive) heart failure: Secondary | ICD-10-CM | POA: Diagnosis not present

## 2023-08-16 DIAGNOSIS — R072 Precordial pain: Secondary | ICD-10-CM | POA: Diagnosis not present

## 2023-08-16 DIAGNOSIS — R079 Chest pain, unspecified: Secondary | ICD-10-CM | POA: Diagnosis not present

## 2023-08-16 LAB — CBC WITH DIFFERENTIAL/PLATELET
Abs Immature Granulocytes: 0.12 10*3/uL — ABNORMAL HIGH (ref 0.00–0.07)
Basophils Absolute: 0 10*3/uL (ref 0.0–0.1)
Basophils Relative: 1 %
Eosinophils Absolute: 0.2 10*3/uL (ref 0.0–0.5)
Eosinophils Relative: 5 %
HCT: 41.4 % (ref 39.0–52.0)
Hemoglobin: 12.4 g/dL — ABNORMAL LOW (ref 13.0–17.0)
Immature Granulocytes: 3 %
Lymphocytes Relative: 24 %
Lymphs Abs: 0.9 10*3/uL (ref 0.7–4.0)
MCH: 26.3 pg (ref 26.0–34.0)
MCHC: 30 g/dL (ref 30.0–36.0)
MCV: 87.7 fL (ref 80.0–100.0)
Monocytes Absolute: 0.7 10*3/uL (ref 0.1–1.0)
Monocytes Relative: 19 %
Neutro Abs: 1.9 10*3/uL (ref 1.7–7.7)
Neutrophils Relative %: 48 %
Platelets: 112 10*3/uL — ABNORMAL LOW (ref 150–400)
RBC: 4.72 MIL/uL (ref 4.22–5.81)
RDW: 14.3 % (ref 11.5–15.5)
WBC: 3.9 10*3/uL — ABNORMAL LOW (ref 4.0–10.5)
nRBC: 0 % (ref 0.0–0.2)

## 2023-08-16 LAB — ECHOCARDIOGRAM LIMITED
Calc EF: 47.5 %
Height: 72 in
S' Lateral: 3.2 cm
Single Plane A2C EF: 46.2 %
Single Plane A4C EF: 46.1 %
Weight: 3984 [oz_av]

## 2023-08-16 LAB — MAGNESIUM: Magnesium: 1.7 mg/dL (ref 1.7–2.4)

## 2023-08-16 LAB — BASIC METABOLIC PANEL
Anion gap: 7 (ref 5–15)
BUN: 22 mg/dL (ref 8–23)
CO2: 22 mmol/L (ref 22–32)
Calcium: 9 mg/dL (ref 8.9–10.3)
Chloride: 110 mmol/L (ref 98–111)
Creatinine, Ser: 1.58 mg/dL — ABNORMAL HIGH (ref 0.61–1.24)
GFR, Estimated: 48 mL/min — ABNORMAL LOW (ref >60)
Glucose, Bld: 126 mg/dL — ABNORMAL HIGH (ref 70–99)
Potassium: 3.6 mmol/L (ref 3.5–5.1)
Sodium: 139 mmol/L (ref 135–145)

## 2023-08-16 MED ORDER — POTASSIUM CHLORIDE CRYS ER 20 MEQ PO TBCR
40.0000 meq | EXTENDED_RELEASE_TABLET | Freq: Once | ORAL | Status: AC
  Administered 2023-08-16: 40 meq via ORAL
  Filled 2023-08-16: qty 2

## 2023-08-16 MED ORDER — CARVEDILOL 25 MG PO TABS
25.0000 mg | ORAL_TABLET | Freq: Two times a day (BID) | ORAL | Status: DC
  Administered 2023-08-16: 25 mg via ORAL
  Filled 2023-08-16: qty 1

## 2023-08-16 MED ORDER — FUROSEMIDE 40 MG PO TABS: 40.0000 mg | ORAL_TABLET | Freq: Every day | ORAL | Status: DC

## 2023-08-16 MED ORDER — APIXABAN 5 MG PO TABS: 5.0000 mg | ORAL_TABLET | Freq: Two times a day (BID) | ORAL | Status: DC

## 2023-08-16 MED ORDER — ISOSORBIDE MONONITRATE ER 60 MG PO TB24: 60.0000 mg | ORAL_TABLET | Freq: Every day | ORAL | 3 refills | Status: DC

## 2023-08-16 MED ORDER — PERFLUTREN LIPID MICROSPHERE
1.0000 mL | INTRAVENOUS | Status: AC | PRN
  Administered 2023-08-16: 1 mL via INTRAVENOUS

## 2023-08-16 NOTE — Plan of Care (Signed)
  Problem: Education: Goal: Understanding of cardiac disease, CV risk reduction, and recovery process will improve Outcome: Completed/Met Goal: Individualized Educational Video(s) Outcome: Completed/Met   Problem: Activity: Goal: Ability to tolerate increased activity will improve Outcome: Completed/Met   Problem: Cardiac: Goal: Ability to achieve and maintain adequate cardiovascular perfusion will improve Outcome: Completed/Met   Problem: Health Behavior/Discharge Planning: Goal: Ability to safely manage health-related needs after discharge will improve Outcome: Completed/Met   Problem: Education: Goal: Knowledge of General Education information will improve Description: Including pain rating scale, medication(s)/side effects and non-pharmacologic comfort measures Outcome: Completed/Met   Problem: Health Behavior/Discharge Planning: Goal: Ability to manage health-related needs will improve Outcome: Completed/Met   Problem: Clinical Measurements: Goal: Ability to maintain clinical measurements within normal limits will improve Outcome: Completed/Met Goal: Will remain free from infection Outcome: Completed/Met Goal: Diagnostic test results will improve Outcome: Completed/Met Goal: Respiratory complications will improve Outcome: Completed/Met Goal: Cardiovascular complication will be avoided Outcome: Completed/Met   Problem: Activity: Goal: Risk for activity intolerance will decrease Outcome: Completed/Met   Problem: Nutrition: Goal: Adequate nutrition will be maintained Outcome: Completed/Met   Problem: Coping: Goal: Level of anxiety will decrease Outcome: Completed/Met   Problem: Elimination: Goal: Will not experience complications related to bowel motility Outcome: Completed/Met Goal: Will not experience complications related to urinary retention Outcome: Completed/Met   Problem: Pain Managment: Goal: General experience of comfort will improve and/or be  controlled Outcome: Completed/Met   Problem: Safety: Goal: Ability to remain free from injury will improve Outcome: Completed/Met   Problem: Skin Integrity: Goal: Risk for impaired skin integrity will decrease Outcome: Completed/Met
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
## 2023-08-16 NOTE — Progress Notes (Signed)
AVS reviewed w/ pt who verbalized an understanding. No other questions at this time- Pt removed from tele- central tele notified by this RN of pt discharge. PIV removed as noted. Pt dressed- d/c to lobby- home w/ brother
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
## 2023-08-16 NOTE — Progress Notes (Signed)
Mobility Specialist - Progress Note  Pre-mobility: 78 bpm HR, During mobility: 96 bHR,  Post-mobility: 77 bpm HR,    08/16/23 1047  Mobility  Activity Ambulated independently in hallway  Level of Assistance Independent  Assistive Device None  Distance Ambulated (ft) 350 ft  Range of Motion/Exercises Active  Activity Response Tolerated well  Mobility Referral Yes  Mobility visit 1 Mobility  Mobility Specialist Start Time (ACUTE ONLY) 1037  Mobility Specialist Stop Time (ACUTE ONLY) 1047  Mobility Specialist Time Calculation (min) (ACUTE ONLY) 10 min   Pt was found in bed and agreeable to ambulate. No complaints with session. At EOS returned to bed with all needs met. Call bell in reach.  Billey Chang Mobility Specialist

## 2023-08-17 NOTE — Progress Notes (Signed)
Cardiology Office Note:    Date:  08/29/2023   ID:  Jon [name], DOB 04-01-1956, MRN [id number]  PCP:  No primary care provider on file.  Cardiologist:  Reatha Harps, MD     Referring MD: Loura Back, NP   Chief Complaint: hospital follow-up of chest pain  History of Present Illness:    Jon [name] is a 68 y.o. male with a history of  chest pain with minimal non-obstructive CAD noted on cardiac catheterization in 02/2017 and negative Myoview on 08/15/2023, chronic HFpEF with severe LVH, paroxysmal atrial fibrillation on Eliquis, hypertension, hyperlipidemia, ESRD s/p renal transplant in 06/2017 and now CKD stage III, GERD, and shingles in 08/2021 with postherpetic neuropathy who is followed by Dr. Flora Lipps and presents today for hospital follow-up of chest pain.   Patient has a history of self-reported MI around 2017 while living in [place] but states he did not have any intervention at that time. Most recent cardiac catheterization in 02/2017 in anticipation of renal transplant showed only minimal disease with minimal luminary irregularities (<10%) of the LAD, LCX, and RCA. He ultimately underwent a deceased-donor kidney transplant in 06/2017.   He was admitted in 12/2022 for chest pain and new onset atrial fibrillation. He was also noted to be in hypertensive crisis during that admission. Echo showed LVEF of 60-65% with severe LVH and grade 1 diastolic dysfunction, normal RV function, and moderate dilatation of the aortic root and ascending aorta measuring 41mm and 39mm. High-sensitivity troponin was mildly elevated and flat in the low 100s. This was felt to be due to demand ischemia in setting of uncontrolled hypertension. Chest pain was felt to be due to postherpetic neuropathy.   He presented to the ED on 08/12/2023 for further evaluation of chest pain and shortness of breath. EKG showed no acute ischemic changes and initial high-sensitivity troponin was elevated at 85. BNP was 210.  Unfortunately, he left before formally being seen by a provider due to the wait time. However, he presented back to the ED the next day for persistent symptoms. High-sensitivity troponin was 122 >> 133. Imdur was increased and patient was given one dose of IV Lasix due to concern for possible volume overload. Myoview showed no evidence of ischemic or infarction but EF was read low at 38%. Therefore, limited Echo was performed and showed LVEF of 50-55% with severe LVH and mild AI. Lower extremity venous dopplers were negative for DVT.  Patient presents today for follow-up.  Overall he is doing well since recent discharge.  He continues to have the atypical right-sided chest pain consistent with his chronic postherpetic neuropathy but he denies any other chest pain like what brought him into the ED earlier this month.  He does describe dyspnea with minimal exertion such as with walking from his living room to the bathroom in his house as well as orthopnea but he states this is not new and has been going on for at least 6 months.  He is not sure if the Lasix has helped this or not.  He denies any PND.  He does have some mild lower extremity edema which is stable.  This is typically worse at the end of the day and then improves with elevation of his legs overnight.  He denies any palpitations, lightheadedness, dizziness, or near syncope.    Of note, he weighs 260 lbs on office scales today (up 11 lbs since recent discharge). However, he presented to the ED earlier this morning because he  was not sure where to go for this appointment and weight was 246 lbs which is 3lbs lower than what is was at discharge.  EKGs/Labs/Other Studies Reviewed:    The following studies were reviewed:  Left Cardiac Catheterization 05/05/2015: Prox LAD to Mid LAD lesion, 10% stenosed. The left ventricular systolic function is normal. _______________  Complete Echocardiogram 05/30/2023: Impressions: 1. HTN heart disease. Left  ventricular ejection fraction, by estimation,  is 60 to 65%. The left ventricle has normal function. The left ventricle  has no regional wall motion abnormalities. There is severe concentric left  ventricular hypertrophy. Left  ventricular diastolic parameters are consistent with Grade I diastolic  dysfunction (impaired relaxation).   2. Right ventricular systolic function is normal. The right ventricular  size is normal.   3. Left atrial size was mildly dilated.   4. The mitral valve is normal in structure. No evidence of mitral valve  regurgitation. Moderate mitral annular calcification.   5. The aortic valve was not well visualized. Aortic valve regurgitation  is trivial. Aortic valve sclerosis/calcification is present, without any  evidence of aortic stenosis.   6. The inferior vena cava is dilated in size with >50% respiratory  variability, suggesting right atrial pressure of 8 mmHg.  _______________  Eugenie Birks Myoview 08/15/2023: Impressions: 1. No reversible ischemia or infarction.  Inferior wall attenuation 2.  Global hypokinesia 3. Left ventricular ejection fraction 38% 4. Non invasive risk stratification*: Intermediate (based on low ejection fraction) _______________  Limited Echocardiogram 08/16/2023: Impressions: 1. Consider additional strain imaging or cardiac MRI given degree of LVH.  Left ventricular ejection fraction, by estimation, is 50 to 55%. The left  ventricle has low normal function. The left ventricular internal cavity  size was mildly dilated. There is   severe left ventricular hypertrophy.   2. Left atrial size was moderately dilated.   3. The mitral valve is abnormal. No evidence of mitral valve  regurgitation.   4. There is mild calcification of the aortic valve. There is mild  thickening of the aortic valve. Aortic valve regurgitation is mild. Aortic  valve sclerosis is present, with no evidence of aortic valve stenosis.   EKG:  EKG ordered today.    EKG Interpretation Date/Time:  Thursday August 29 2023 08:55:19 EST Ventricular Rate:  72 PR Interval:  186 QRS Duration:  122 QT Interval:  404 QTC Calculation: 442 R Axis:   -61  Text Interpretation: Normal sinus rhythm Left anterior fascicular block Minimal voltage criteria for LVH, may be normal variant ( Cornell product )  T wave inversions in leads I and aVL Confirmed by Marjie Skiff [phone]) on 08/29/2023 9:05:29 AM    Recent Labs: 01/02/2023: TSH 3.148 08/12/2023: B Natriuretic Peptide 210.9 08/14/2023: ALT 17 08/16/2023: BUN 22; Creatinine, Ser 1.58; Hemoglobin 12.4; Magnesium 1.7; Platelets 112; Potassium 3.6; Sodium 139  Recent Lipid Panel    Component Value Date/Time   CHOL 123 08/14/2023 0714   TRIG 139 08/14/2023 0714   HDL 35 (L) 08/14/2023 0714   CHOLHDL 3.5 08/14/2023 0714   VLDL 28 08/14/2023 0714   LDLCALC 60 08/14/2023 0714    Physical Exam:    Vital Signs: BP 106/80 (BP Location: Left Arm, Patient Position: Sitting, Cuff Size: Large)   Pulse 72   Ht 6' (1.829 m)   Wt 260 lb 6.4 oz (118.1 kg)   SpO2 92%   BMI 35.32 kg/m     Wt Readings from Last 3 Encounters:  08/29/23 260 lb 6.4 oz (118.1  kg)  08/29/23 246 lb 14.6 oz (112 kg)  08/14/23 249 lb (112.9 kg)     General: 68 y.o.  male in no acute distress. HEENT: Normocephalic and atraumatic. Sclera clear.  Neck: Supple. No carotid bruits. No JVD. Heart:  RRR. Possible soft I/ VI systolic murmur noted at right upper sternal border. No rubs or gallops. Lungs: No increased work of breathing. Clear to ausculation bilaterally. No wheezes, rhonchi, or rales.  Abdomen: Soft, obese, and non-tender to palpation.  Extremities: Trace to 1+ pitting edema of bilateral lower extremities (worse around ankles).  Skin: Warm and dry. Neuro: No focal deficits. Psych: Normal affect. Responds appropriately.   Assessment:    1. Non-obstructive CAD   2. Chronic heart failure with preserved ejection fraction  (HFpEF) (HCC)   3. LVH (left ventricular hypertrophy)   4. Paroxysmal atrial fibrillation (HCC)   5. Frequent PVCs   6. Primary hypertension   7. Hyperlipidemia, unspecified hyperlipidemia type   8. History of ESRD s/p renal transplant now with CKD stage III     Plan:    Non-Obstructive CAD LHC in 02/2017 prior to renal transplant showed only minimal disease with minimal luminal irregularities of LAD, LCX, and RCA. He was recently admitted for further evaluation of chest pain and shortness of breath. Myoview showed no evidence of ischemia or prior infarction. Imdur was increased and he was given started on Lasix due to concern for volume overload as cause of symptoms.  - He continues to have atypical chest pain consistent with post-herpetic neuropathy but no other chest pain like what led to recent ED visit. - Continue Amlodipine 10mg  daily and Coreg 25mg  twice daily. Imdur had been increased to 90mg  during hospitalization but looks like he was discharged on previous dose of 60mg  daily. Given no recurrent issues, OK to continue 60mg  daily for now.  - Not on aspirin at home given need for full anticoagulation. - Continue statin.    Chronic HFpEF Severe LVH Most recent Echo on 08/16/2023 showed LVEF of 50-55% with severe LVH. There has been low suspicion for cardiac amyloidosis in the past and severe LVH has been felt to be due to uncontrolled hypertension.  - He does not look significantly volume overloaded today. He has some mild lower extremity edema. However, he describes some dyspnea with minimal exertion and orthopnea (although this is not new).  - Will check BNP and BMET today.  - Continue Lasix 40mg  daily.  - Continue management for hypertension as below. - Discussed importance of daily weights and sodium/ fluid restrictions. Advised patient to notify us if he gains 3lbs in 1 day or 5lbs in 1 week.    Paroxysmal Atrial Fibrillation Frequent PVCs EKG today shows normal sinus rhythm  with no ectopy.  - No palpitations.  - Continue Coreg 25mg  twice daily. - Continue Eliquis 5mg  twice daily.   Hypertension BP well controlled.  - Continue current medications: Amlodipine 10mg  daily, Coreg 25mg  twice daily, Hydralazine 100mg  three times daily, and Imdur 60mg  daily.    Hyperlipidemia Lipid panel on 08/14/2023: Total Cholesterol 123, Triglycerides 139, HDL 35, LDL 60.  - Continue Lipitor 20mg  daily.   ESRD s/p Renal Transplant  History of renal transplant in 2018. Now with CKD stage III. Baseline creatinine around 1.3 to 1.5. Stable at 1.58 at recent discharge. - Will repeat BMET today.   Disposition: Follow up in 4 months.    Leanne Lovely, PA-C  08/29/2023 12:57 PM    [facility]

## 2023-08-29 ENCOUNTER — Encounter: Payer: Self-pay | Admitting: Student

## 2023-08-29 ENCOUNTER — Encounter (HOSPITAL_COMMUNITY): Payer: Self-pay

## 2023-08-29 ENCOUNTER — Emergency Department (HOSPITAL_COMMUNITY)
Admission: EM | Admit: 2023-08-29 | Discharge: 2023-08-29 | Discharge status: Against Medical Advice | Payer: 59 | Attending: Emergency Medicine | Admitting: Emergency Medicine

## 2023-08-29 ENCOUNTER — Ambulatory Visit: Payer: 59 | Attending: Student | Admitting: Student

## 2023-08-29 ENCOUNTER — Other Ambulatory Visit: Payer: Self-pay

## 2023-08-29 VITALS — BP 106/80 | HR 72 | Ht 72.0 in | Wt 260.4 lb

## 2023-08-29 DIAGNOSIS — I517 Cardiomegaly: Secondary | ICD-10-CM | POA: Diagnosis not present

## 2023-08-29 DIAGNOSIS — I2510 Atherosclerotic heart disease of native coronary artery without angina pectoris: Secondary | ICD-10-CM

## 2023-08-29 DIAGNOSIS — R0602 Shortness of breath: Secondary | ICD-10-CM | POA: Insufficient documentation

## 2023-08-29 DIAGNOSIS — I480 Paroxysmal atrial fibrillation: Secondary | ICD-10-CM

## 2023-08-29 DIAGNOSIS — I5032 Chronic diastolic (congestive) heart failure: Secondary | ICD-10-CM

## 2023-08-29 DIAGNOSIS — E785 Hyperlipidemia, unspecified: Secondary | ICD-10-CM

## 2023-08-29 DIAGNOSIS — Z5321 Procedure and treatment not carried out due to patient leaving prior to being seen by health care provider: Secondary | ICD-10-CM | POA: Insufficient documentation

## 2023-08-29 DIAGNOSIS — I493 Ventricular premature depolarization: Secondary | ICD-10-CM

## 2023-08-29 DIAGNOSIS — N186 End stage renal disease: Secondary | ICD-10-CM

## 2023-08-29 DIAGNOSIS — I10 Essential (primary) hypertension: Secondary | ICD-10-CM

## 2023-08-29 NOTE — Patient Instructions (Addendum)
Medication Instructions:  The current medical regimen is effective;  continue present plan and medications as directed. Please refer to the Current Medication list given to you today.  *If you need a refill on your cardiac medications before your next appointment, please call your pharmacy*  Lab Work: BMET AND BNP TODAY If you have labs (blood work) drawn today and your tests are completely normal, you will receive your results only by:  MyChart Message (if you have MyChart) OR  A paper copy in the mail If you have any lab test that is abnormal or we need to change your treatment, we will call you to review the results.  Other Instructions PLEASE READ AND FOLLOW ATTACHED  SALTY 6  SEE HEART FAILURE INSTRUCTIONS BELOW KEEP AND EYE ON YOUR BLOOD PRESSURE AT HOME IF CONSISTENTLY 130/80 OR GREATER CALL AND LET us KNOW   Follow-Up: At [facility], you and your health needs are our priority.  As part of our continuing mission to provide you with exceptional heart care, we have created designated Provider Care Teams.  These Care Teams include your primary Cardiologist (physician) and Advanced Practice Providers (APPs -  Physician Assistants and Nurse Practitioners) who all work together to provide you with the care you need, when you need it.  We recommend signing up for the patient portal called "MyChart".  Sign up information is provided on this After Visit Summary.  MyChart is used to connect with patients for Virtual Visits (Telemedicine).  Patients are able to view lab/test results, encounter notes, upcoming appointments, etc.  Non-urgent messages can be sent to your provider as well.   To learn more about what you can do with MyChart, go to [email or web address].    Your next appointment:   4 month(s)  Provider:   Reatha Harps, MD  or Marjie Skiff, PA-C        Heart Failure Education: Weigh yourself EVERY morning after you go to the bathroom but before you eat or drink  anything. Write this number down in a weight log/diary. If you gain 3 pounds overnight or 5 pounds in a week, call the office. Take your medicines as prescribed. If you have concerns about your medications, please call us before you stop taking them.  Eat low salt foods--Limit salt (sodium) to 2000 mg per day. This will help prevent your body from holding onto fluid. Read food labels as many processed foods have a lot of sodium, especially canned goods and prepackaged meats. If you would like some assistance choosing low sodium foods, we would be happy to set you up with a nutritionist. Limit all fluids for the day to less than 2 liters (64 ounces). Fluid includes all drinks, coffee, juice, ice chips, soup, jello, and all other liquids. Stay as active as you can everyday. Staying active will give you more energy and make your muscles stronger. Start with 5 minutes at a time and work your way up to 30 minutes a day. Break up your activities--do some in the morning and some in the afternoon. Start with 3 days per week and work your way up to 5 days as you can.  If you have chest pain, feel short of breath, dizzy, or lightheaded, STOP. If you don't feel better after a short rest, call 911. If you do feel better, call the office to let us know you have symptoms with exercise.

## 2023-08-29 NOTE — ED Triage Notes (Addendum)
Patient said he is here for a follow up. Was in the hospital 2 weeks ago for chest pain and shortness of breath. Said he still has a breathing problem when he lays down flat. Showed this RN his paperwork and it says to follow up with cardiology. Patient did not know where to follow up. Denies pain.

## 2023-08-29 NOTE — ED Notes (Signed)
Patient said he was going to get a ride to the cardiology office.

## 2023-08-30 LAB — BASIC METABOLIC PANEL
BUN/Creatinine Ratio: 10 (ref 10–24)
BUN: 15 mg/dL (ref 8–27)
CO2: 19 mmol/L — ABNORMAL LOW (ref 20–29)
Calcium: 9 mg/dL (ref 8.6–10.2)
Chloride: 105 mmol/L (ref 96–106)
Creatinine, Ser: 1.52 mg/dL — ABNORMAL HIGH (ref 0.76–1.27)
Glucose: 131 mg/dL — ABNORMAL HIGH (ref 70–99)
Potassium: 3.7 mmol/L (ref 3.5–5.2)
Sodium: 144 mmol/L (ref 134–144)
eGFR: 51 mL/min/{1.73_m2} — ABNORMAL LOW (ref >59)

## 2023-08-30 LAB — BRAIN NATRIURETIC PEPTIDE: BNP: 292.3 pg/mL — ABNORMAL HIGH (ref 0.0–100.0)

## 2023-10-09 ENCOUNTER — Other Ambulatory Visit: Payer: Self-pay | Admitting: Registered Nurse

## 2023-10-09 DIAGNOSIS — R7401 Elevation of levels of liver transaminase levels: Secondary | ICD-10-CM

## 2024-04-15 ENCOUNTER — Inpatient Hospital Stay (HOSPITAL_COMMUNITY)
Admission: EM | Admit: 2024-04-15 | Discharge: 2024-04-18 | DRG: 308 | Disposition: A | Discharge status: Home / Self Care | Attending: Cardiovascular Disease | Admitting: Cardiovascular Disease

## 2024-04-15 ENCOUNTER — Emergency Department (HOSPITAL_COMMUNITY)

## 2024-04-15 ENCOUNTER — Other Ambulatory Visit: Payer: Self-pay

## 2024-04-15 DIAGNOSIS — I4891 Unspecified atrial fibrillation: Secondary | ICD-10-CM | POA: Diagnosis present

## 2024-04-15 DIAGNOSIS — Z7951 Long term (current) use of inhaled steroids: Secondary | ICD-10-CM | POA: Diagnosis not present

## 2024-04-15 DIAGNOSIS — Z5948 Other specified lack of adequate food: Secondary | ICD-10-CM | POA: Diagnosis not present

## 2024-04-15 DIAGNOSIS — R918 Other nonspecific abnormal finding of lung field: Secondary | ICD-10-CM | POA: Diagnosis present

## 2024-04-15 DIAGNOSIS — J449 Chronic obstructive pulmonary disease, unspecified: Secondary | ICD-10-CM | POA: Diagnosis present

## 2024-04-15 DIAGNOSIS — Z5941 Food insecurity: Secondary | ICD-10-CM | POA: Diagnosis not present

## 2024-04-15 DIAGNOSIS — I358 Other nonrheumatic aortic valve disorders: Secondary | ICD-10-CM | POA: Diagnosis present

## 2024-04-15 DIAGNOSIS — I10 Essential (primary) hypertension: Secondary | ICD-10-CM | POA: Diagnosis not present

## 2024-04-15 DIAGNOSIS — I4819 Other persistent atrial fibrillation: Secondary | ICD-10-CM | POA: Diagnosis present

## 2024-04-15 DIAGNOSIS — I2489 Other forms of acute ischemic heart disease: Secondary | ICD-10-CM | POA: Diagnosis present

## 2024-04-15 DIAGNOSIS — I517 Cardiomegaly: Secondary | ICD-10-CM | POA: Insufficient documentation

## 2024-04-15 DIAGNOSIS — K219 Gastro-esophageal reflux disease without esophagitis: Secondary | ICD-10-CM | POA: Diagnosis present

## 2024-04-15 DIAGNOSIS — I480 Paroxysmal atrial fibrillation: Principal | ICD-10-CM

## 2024-04-15 DIAGNOSIS — I428 Other cardiomyopathies: Secondary | ICD-10-CM | POA: Diagnosis present

## 2024-04-15 DIAGNOSIS — Z8249 Family history of ischemic heart disease and other diseases of the circulatory system: Secondary | ICD-10-CM

## 2024-04-15 DIAGNOSIS — I509 Heart failure, unspecified: Secondary | ICD-10-CM

## 2024-04-15 DIAGNOSIS — I252 Old myocardial infarction: Secondary | ICD-10-CM | POA: Diagnosis not present

## 2024-04-15 DIAGNOSIS — Z940 Kidney transplant status: Secondary | ICD-10-CM

## 2024-04-15 DIAGNOSIS — Z7901 Long term (current) use of anticoagulants: Secondary | ICD-10-CM | POA: Diagnosis not present

## 2024-04-15 DIAGNOSIS — N179 Acute kidney failure, unspecified: Secondary | ICD-10-CM | POA: Diagnosis present

## 2024-04-15 DIAGNOSIS — Z833 Family history of diabetes mellitus: Secondary | ICD-10-CM | POA: Diagnosis not present

## 2024-04-15 DIAGNOSIS — Z560 Unemployment, unspecified: Secondary | ICD-10-CM

## 2024-04-15 DIAGNOSIS — E785 Hyperlipidemia, unspecified: Secondary | ICD-10-CM | POA: Diagnosis present

## 2024-04-15 DIAGNOSIS — Z79624 Long term (current) use of inhibitors of nucleotide synthesis: Secondary | ICD-10-CM

## 2024-04-15 DIAGNOSIS — Y830 Surgical operation with transplant of whole organ as the cause of abnormal reaction of the patient, or of later complication, without mention of misadventure at the time of the procedure: Secondary | ICD-10-CM | POA: Diagnosis present

## 2024-04-15 DIAGNOSIS — Z992 Dependence on renal dialysis: Secondary | ICD-10-CM

## 2024-04-15 DIAGNOSIS — I444 Left anterior fascicular block: Secondary | ICD-10-CM | POA: Diagnosis present

## 2024-04-15 DIAGNOSIS — I130 Hypertensive heart and chronic kidney disease with heart failure and stage 1 through stage 4 chronic kidney disease, or unspecified chronic kidney disease: Secondary | ICD-10-CM | POA: Diagnosis present

## 2024-04-15 DIAGNOSIS — I2510 Atherosclerotic heart disease of native coronary artery without angina pectoris: Secondary | ICD-10-CM | POA: Diagnosis present

## 2024-04-15 DIAGNOSIS — I5031 Acute diastolic (congestive) heart failure: Secondary | ICD-10-CM | POA: Diagnosis not present

## 2024-04-15 DIAGNOSIS — I5033 Acute on chronic diastolic (congestive) heart failure: Secondary | ICD-10-CM | POA: Diagnosis present

## 2024-04-15 DIAGNOSIS — Z79899 Other long term (current) drug therapy: Secondary | ICD-10-CM | POA: Diagnosis not present

## 2024-04-15 DIAGNOSIS — N1831 Chronic kidney disease, stage 3a: Secondary | ICD-10-CM | POA: Diagnosis present

## 2024-04-15 DIAGNOSIS — T8619 Other complication of kidney transplant: Secondary | ICD-10-CM | POA: Diagnosis present

## 2024-04-15 DIAGNOSIS — I110 Hypertensive heart disease with heart failure: Secondary | ICD-10-CM | POA: Diagnosis not present

## 2024-04-15 DIAGNOSIS — R7989 Other specified abnormal findings of blood chemistry: Secondary | ICD-10-CM | POA: Diagnosis present

## 2024-04-15 LAB — I-STAT CHEM 8, ED
BUN: 10 mg/dL (ref 8–23)
Calcium, Ion: 1.15 mmol/L (ref 1.15–1.40)
Chloride: 107 mmol/L (ref 98–111)
Creatinine, Ser: 1.5 mg/dL — ABNORMAL HIGH (ref 0.61–1.24)
Glucose, Bld: 236 mg/dL — ABNORMAL HIGH (ref 70–99)
HCT: 43 % (ref 39.0–52.0)
Hemoglobin: 14.6 g/dL (ref 13.0–17.0)
Potassium: 4.4 mmol/L (ref 3.5–5.1)
Sodium: 140 mmol/L (ref 135–145)
TCO2: 23 mmol/L (ref 22–32)

## 2024-04-15 LAB — BASIC METABOLIC PANEL WITH GFR
Anion gap: 14 (ref 5–15)
BUN: 10 mg/dL (ref 8–23)
CO2: 19 mmol/L — ABNORMAL LOW (ref 22–32)
Calcium: 9.3 mg/dL (ref 8.9–10.3)
Chloride: 107 mmol/L (ref 98–111)
Creatinine, Ser: 1.49 mg/dL — ABNORMAL HIGH (ref 0.61–1.24)
GFR, Estimated: 52 mL/min — ABNORMAL LOW (ref >60)
Glucose, Bld: 223 mg/dL — ABNORMAL HIGH (ref 70–99)
Potassium: 4.7 mmol/L (ref 3.5–5.1)
Sodium: 140 mmol/L (ref 135–145)

## 2024-04-15 LAB — CBC
HCT: 46.2 % (ref 39.0–52.0)
Hemoglobin: 14.5 g/dL (ref 13.0–17.0)
MCH: 27 pg (ref 26.0–34.0)
MCHC: 31.4 g/dL (ref 30.0–36.0)
MCV: 85.9 fL (ref 80.0–100.0)
Platelets: 164 K/uL (ref 150–400)
RBC: 5.38 MIL/uL (ref 4.22–5.81)
RDW: 14.5 % (ref 11.5–15.5)
WBC: 9.1 K/uL (ref 4.0–10.5)
nRBC: 0 % (ref 0.0–0.2)

## 2024-04-15 LAB — BRAIN NATRIURETIC PEPTIDE: B Natriuretic Peptide: 418.1 pg/mL — ABNORMAL HIGH (ref 0.0–100.0)

## 2024-04-15 LAB — TROPONIN I (HIGH SENSITIVITY)
Troponin I (High Sensitivity): 45 ng/L — ABNORMAL HIGH (ref <18)
Troponin I (High Sensitivity): 53 ng/L — ABNORMAL HIGH (ref <18)

## 2024-04-15 MED ORDER — ACETAMINOPHEN 325 MG PO TABS
650.0000 mg | ORAL_TABLET | ORAL | Status: DC | PRN
  Administered 2024-04-17: 650 mg via ORAL
  Filled 2024-04-15: qty 2

## 2024-04-15 MED ORDER — BRIMONIDINE TARTRATE 0.2 % OP SOLN
1.0000 [drp] | Freq: Two times a day (BID) | OPHTHALMIC | Status: DC
  Administered 2024-04-16 – 2024-04-18 (×5): 1 [drp] via OPHTHALMIC
  Filled 2024-04-15 (×2): qty 5

## 2024-04-15 MED ORDER — CARVEDILOL 25 MG PO TABS
25.0000 mg | ORAL_TABLET | Freq: Two times a day (BID) | ORAL | Status: DC
Start: 2024-04-16 — End: 2024-04-17
  Administered 2024-04-16 – 2024-04-17 (×3): 25 mg via ORAL
  Filled 2024-04-15: qty 2
  Filled 2024-04-15 (×2): qty 1

## 2024-04-15 MED ORDER — FUROSEMIDE 10 MG/ML IJ SOLN
80.0000 mg | Freq: Once | INTRAMUSCULAR | Status: AC
Start: 2024-04-15 — End: 2024-04-15
  Administered 2024-04-15: 80 mg via INTRAVENOUS
  Filled 2024-04-15: qty 8

## 2024-04-15 MED ORDER — HYDRALAZINE HCL 50 MG PO TABS
100.0000 mg | ORAL_TABLET | Freq: Three times a day (TID) | ORAL | Status: DC
  Administered 2024-04-16 – 2024-04-18 (×8): 100 mg via ORAL
  Filled 2024-04-15 (×8): qty 2

## 2024-04-15 MED ORDER — MYCOPHENOLATE 200 MG/ML ORAL SUSPENSION
1000.0000 mg | Freq: Two times a day (BID) | ORAL | Status: DC
  Administered 2024-04-16 – 2024-04-18 (×6): 1000 mg via ORAL
  Filled 2024-04-15 (×6): qty 5

## 2024-04-15 MED ORDER — ATORVASTATIN CALCIUM 10 MG PO TABS
20.0000 mg | ORAL_TABLET | Freq: Every day | ORAL | Status: DC
  Administered 2024-04-16 – 2024-04-17 (×3): 20 mg via ORAL
  Filled 2024-04-15 (×3): qty 2

## 2024-04-15 MED ORDER — ONDANSETRON HCL 4 MG/2ML IJ SOLN: 4.0000 mg | Freq: Four times a day (QID) | INTRAMUSCULAR | Status: DC | PRN

## 2024-04-15 MED ORDER — BRIMONIDINE TARTRATE-TIMOLOL 0.2-0.5 % OP SOLN: 1.0000 [drp] | Freq: Two times a day (BID) | OPHTHALMIC | Status: DC

## 2024-04-15 MED ORDER — AMITRIPTYLINE HCL 25 MG PO TABS
25.0000 mg | ORAL_TABLET | Freq: Every day | ORAL | Status: DC
  Administered 2024-04-16 – 2024-04-17 (×3): 25 mg via ORAL
  Filled 2024-04-15 (×3): qty 1

## 2024-04-15 MED ORDER — AMLODIPINE BESYLATE 10 MG PO TABS
10.0000 mg | ORAL_TABLET | Freq: Every day | ORAL | Status: DC
  Administered 2024-04-16 (×2): 10 mg via ORAL
  Filled 2024-04-15: qty 1
  Filled 2024-04-15: qty 2

## 2024-04-15 MED ORDER — TIMOLOL MALEATE 0.5 % OP SOLN
1.0000 [drp] | Freq: Two times a day (BID) | OPHTHALMIC | Status: DC
  Administered 2024-04-16 – 2024-04-18 (×5): 1 [drp] via OPHTHALMIC
  Filled 2024-04-15 (×2): qty 5

## 2024-04-15 NOTE — ED Provider Notes (Signed)
  EMERGENCY DEPARTMENT AT [facility] Provider Note   CSN: [id number] Arrival date & time: 04/15/24  1606     Patient presents with: Shortness of Breath   Jon [name] is a 68 y.o. male.   HPI Patient reports that he went to the dentist today and at the dentist office he was noted to be short of breath and had a high heart rate.  911 was called for transport to the emergency department.  Patient does have a history of paroxysmal atrial fibrillation.  He reports that he was taken off of blood thinners a little less than a year ago.  He reports that this was recommended by his doctor.  However, the patient notes that he does get exertional dyspnea and sometimes can feel his heart racing or skipping beats.  This is most noticeable with exertion or lying flat.  He reports he does have to sleep somewhat sitting up at night.  Sometimes he feels like he has got some tightness or discomfort in his chest.  Patient has history of renal transplant.  He reports he is very compliant with all of his transplant medications.    Prior to Admission medications   Medication Sig Start Date End Date Taking? Authorizing Provider  albuterol  (PROVENTIL  HFA;VENTOLIN  HFA) 108 (90 Base) MCG/ACT inhaler Inhale 2 puffs into the lungs every 6 (six) hours as needed for wheezing or shortness of breath. 01/24/17  Yes Alfornia Madison, MD  amitriptyline  (ELAVIL ) 25 MG tablet Take 25 mg by mouth at bedtime. 12/13/23  Yes [provider]  amLODipine  (NORVASC ) 10 MG tablet Take 10 mg by mouth at bedtime.   Yes [provider]  atorvastatin  (LIPITOR) 20 MG tablet Take 20 mg by mouth at bedtime.   Yes [provider]  brimonidine -timolol  (COMBIGAN ) 0.2-0.5 % ophthalmic solution Place 1 drop into both eyes every 12 (twelve) hours.   Yes [provider]  carvedilol  (COREG ) 25 MG tablet Take 1 tablet (25 mg total) by mouth 2 (two) times daily with a meal. 04/24/19  Yes Mullis,  Kiersten P, DO  hydrALAZINE  (APRESOLINE ) 100 MG tablet Take 1 tablet (100 mg total) by mouth every 8 (eight) hours. 01/06/23  Yes Patsy Lenis, MD  magnesium  oxide (MAG-OX) 400 MG tablet Take 800 mg by mouth 2 (two) times daily. 01/28/24  Yes [provider]  mycophenolate  (CELLCEPT ) 200 MG/ML suspension Take 1,000 mg by mouth 2 (two) times a day. 03/20/18  Yes [provider]  predniSONE  5 MG/5ML solution Take 5 mg by mouth daily. 11/18/18  Yes [provider]  sulfamethoxazole -trimethoprim  (BACTRIM ) 200-40 MG/5ML suspension Take 10 mLs by mouth 3 (three) times a week. Take 10 ml by mouth three times a week on M-W-F. 02/04/24  Yes [provider]  tacrolimus  (PROGRAF ) 1 MG capsule Take 2-3 mg by mouth See admin instructions. Take 3 mg by mouth in the morning and 2 mg at bedtime   Yes [provider]  VYZULTA  0.024 % SOLN Place 1 drop into both eyes at bedtime.   Yes [provider]  fluticasone  (FLOVENT  HFA) 44 MCG/ACT inhaler Inhale 1 puff into the lungs 2 (two) times daily. Patient not taking: No sig reported 03/08/17 03/04/21  Sood, Vineet, MD    Allergies: Patient has no known allergies.    Review of Systems  Updated Vital Signs BP 129/78   Pulse 97   Temp 98.2 F (36.8 C)   Resp 13   Ht 6' (1.829 m)  Wt 118.1 kg   SpO2 100%   BMI 35.31 kg/m   Physical Exam Constitutional:      Comments: Patient is alert.  No acute distress.  Very mild increased work of breathing at rest.  HENT:     Mouth/Throat:     Pharynx: Oropharynx is clear.  Eyes:     Extraocular Movements: Extraocular movements intact.  Cardiovascular:     Rate and Rhythm: Tachycardia present. Rhythm irregular.  Pulmonary:     Comments: Very mild increased work of breathing at rest.  Lungs grossly clear with some faint crackle at the bases Abdominal:     General: There is no distension.     Palpations: Abdomen is soft.     Tenderness: There is no abdominal  tenderness. There is no guarding.  Musculoskeletal:        General: No swelling or tenderness. Normal range of motion.     Right lower leg: No edema.     Left lower leg: No edema.  Skin:    General: Skin is warm and dry.  Neurological:     General: No focal deficit present.     Mental Status: He is oriented to person, place, and time.     Motor: No weakness.     Coordination: Coordination normal.  Psychiatric:        Mood and Affect: Mood normal.     (all labs ordered are listed, but only abnormal results are displayed) Labs Reviewed  BASIC METABOLIC PANEL WITH GFR - Abnormal; Notable for the following components:      Result Value   CO2 19 (*)    Glucose, Bld 223 (*)    Creatinine, Ser 1.49 (*)    GFR, Estimated 52 (*)    All other components within normal limits  BRAIN NATRIURETIC PEPTIDE - Abnormal; Notable for the following components:   B Natriuretic Peptide 418.1 (*)    All other components within normal limits  I-STAT CHEM 8, ED - Abnormal; Notable for the following components:   Creatinine, Ser 1.50 (*)    Glucose, Bld 236 (*)    All other components within normal limits  TROPONIN I (HIGH SENSITIVITY) - Abnormal; Notable for the following components:   Troponin I (High Sensitivity) 45 (*)    All other components within normal limits  TROPONIN I (HIGH SENSITIVITY) - Abnormal; Notable for the following components:   Troponin I (High Sensitivity) 53 (*)    All other components within normal limits  CBC    EKG: EKG Interpretation Date/Time:  Wednesday April 15 2024 16:17:21 EDT Ventricular Rate:  96 PR Interval:    QRS Duration:  102 QT Interval:  362 QTC Calculation: 457 R Axis:   -76  Text Interpretation: Atrial fibrillation Left anterior fascicular block Minimal voltage criteria for LVH, may be normal variant ( Cornell product ) ST & T wave abnormality, consider lateral ischemia Abnormal ECG When compared with ECG of 29-Aug-2023 08:55, PREVIOUS ECG IS  PRESENT QRS morphology unchanged, afib since last tracing. Confirmed by Armenta Canning [phone]) on 04/15/2024 5:45:50 PM  Radiology: DG Chest 2 View Result Date: 04/15/2024 CLINICAL DATA:  Shortness of breath. EXAM: CHEST - 2 VIEW COMPARISON:  08/12/2023 FINDINGS: Chronic cardiomegaly. Vascular congestion without pulmonary edema. No focal airspace disease, large pleural effusion or pneumothorax. No acute osseous findings. IMPRESSION: Chronic cardiomegaly with vascular congestion. Electronically Signed   By: Andrea Gasman M.D.   On: 04/15/2024 18:00     Procedures  CRITICAL  CARE Performed by: Ludivina Shines   Total critical care time: 30 minutes  Critical care time was exclusive of separately billable procedures and treating other patients.  Critical care was necessary to treat or prevent imminent or life-threatening deterioration.  Critical care was time spent personally by me on the following activities: development of treatment plan with patient and/or surrogate as well as nursing, discussions with consultants, evaluation of patient's response to treatment, examination of patient, obtaining history from patient or surrogate, ordering and performing treatments and interventions, ordering and review of laboratory studies, ordering and review of radiographic studies, pulse oximetry and re-evaluation of patient's condition.  Medications Ordered in the ED  furosemide  (LASIX ) injection 80 mg (80 mg Intravenous Given 04/15/24 2118)                                    Medical Decision Making Amount and/or Complexity of Data Reviewed Labs: ordered. Radiology: ordered.  Risk Prescription drug management. Decision regarding hospitalization.   Patient presents as outlined.  He does have known history of paroxysmal atrial fibrillation.  Patient is not anticoagulated.  He does present with A-fib and rates in the 110s at rest.  Patient reports one of his providers said that he did not need to  take the Eliquis  anymore.  The last note that I identified from his admission indicated his CHA2DS2-VASc was greater than 3 and he is supposed to be anticoagulated.  By history suggestive of mild congestive heart failure symptoms.  Patient has prior history of cardiomegaly and LVH with last echo [id number] EF 50 to 55% with mild mitral and aortic valve calcification.  First troponin 45 GFR 52 blood glucose 223 CBC normal  Chest x-ray personally reviewed by myself and reviewed by cardiology positive for cardiomegaly and vascular congestion without frank edema.  Consult: Reviewed with cardiology fellow Dr. Duffy.  Recommends giving Lasix  80 mg IV and she will consult in the emergency department to determine admission versus reinstitution of anticoagulation and rate control medications.     Final diagnoses:  Paroxysmal atrial fibrillation (HCC)  Acute on chronic congestive heart failure, unspecified heart failure type [facility])  Kidney transplant recipient    ED Discharge Orders     None          Shines Ludivina, MD 04/15/24 2334

## 2024-04-15 NOTE — ED Triage Notes (Addendum)
 Patient arrives via [facility] from the dentist. New onset dyspnea with exertion going into dentist. Dentist sat patient down and called 911. Hx of afib, stents. Patient alert oriented x4. 2 liters for comfort, no desat. 100% on room air. Patient not anti-coagulated. Patient hx of kidney transplant.   HR 90 irregular

## 2024-04-15 NOTE — H&P (Signed)
 Cardiology Admission History and Physical:    Patient ID: Jon [name] MRN: [id number]; DOB: Oct 07, 1955   Admission date: 04/15/2024  Primary Care Provider: [facility] Primary Cardiologist: Darryle ONEIDA Decent, MD  Primary Electrophysiologist:  None   Chief Complaint:  Shortness of Breath   Patient Profile:   Jon [name] is a 68 y.o. male with NICM with severe LVH and HFEF, HTN, pafib, ESRD 2/2 HTN s/p kidney trasnplant (2018)   History of Present Illness:   [name] states that he has noticed shortness of breath with minimal exertion this week. He endorses orthopnea for the last 3 nights. Today when he went to the dentist he was short of breath and it was noticed that his heart rate was high, but unclear how high his heart rate was. Denies peripheral edema. When he arrived to the ED he was found to be in atrial fibrillation with rates <105 bpm. He takes carvedilol  at home.   Past Medical History:  Diagnosis Date   Chronic kidney disease    Dialysis patient (HCC)    Dyspnea    GERD (gastroesophageal reflux disease)    History of blood transfusion    Hyperlipidemia    takes Atorvastatin  daily   Hypertension    takes Amlodipine ,Losartan ,and Labetalol  daily   Myocardial infarction [facility])    lived in [place]  maybe 7 years ago   Small bowel obstruction [facility])     Past Surgical History:  Procedure Laterality Date   ARTERIOVENOUS GRAFT PLACEMENT     AV FISTULA PLACEMENT Right 10/25/2015   Procedure: ARTERIOVENOUS (AV) FISTULA CREATION RIGHT FOREARM;  Surgeon: Lonni GORMAN Blade, MD;  Location: [facility] OR;  Service: Vascular;  Laterality: Right;   BOTOX  INJECTION N/A 08/07/2021   Procedure: cystoscopy BOTOX  INJECTION;  Surgeon: Carolee Sherwood [name] DOUGLAS, MD;  Location: [facility] ORS;  Service: Urology;  Laterality: N/A;   CARDIAC CATHETERIZATION N/A 05/05/2015   Procedure: Left Heart Cath and Coronary Angiography;  Surgeon: Rober Chroman, MD;  Location: [facility] INVASIVE CV LAB;  Service:  Cardiovascular;  Laterality: N/A;   LIGATION OF ARTERIOVENOUS  FISTULA Left 01/06/2019   Procedure: EXCISION OF ANEURYSMAL  ARTERIOVENOUS  FISTULA LEFT ARM;  Surgeon: Blade Lonni GORMAN, MD;  Location: [facility] OR;  Service: Vascular;  Laterality: Left;   PERIPHERAL VASCULAR CATHETERIZATION N/A 08/18/2015   Procedure: Fistulagram;  Surgeon: Redell LITTIE Door, MD;  Location: [facility] INVASIVE CV LAB;  Service: Cardiovascular;  Laterality: N/A;   PERIPHERAL VASCULAR CATHETERIZATION Right 04/19/2016   Procedure: Fistulagram;  Surgeon: Redell LITTIE Door, MD;  Location: [facility] INVASIVE CV LAB;  Service: Cardiovascular;  Laterality: Right;   RESECTION OF ARTERIOVENOUS FISTULA ANEURYSM Left 02/05/2013   Procedure: REPAIR OF ANEURYSM OF LEFT ARM ARTERIOVENOUS FISTULA ;  Surgeon: Gaile LELON New, MD;  Location: [facility] OR;  Service: Vascular;  Laterality: Left;   RESECTION OF ARTERIOVENOUS FISTULA ANEURYSM Left 03/08/2015   Procedure: REPAIR OF LEFT ARTERIOVENOUS FISTULA PSEUDOANEURYSM;  Surgeon: Lonni GORMAN Blade, MD;  Location: [facility] OR;  Service: Vascular;  Laterality: Left;   REVISON OF ARTERIOVENOUS FISTULA Left 06/02/2015   Procedure: PLICATION OF A LARGE ANEURYSM LEFT UPPER ARM BRACHIO-CEPHALIC ARTERIOVENOUS FISTULA ;  Surgeon: Lonni GORMAN Blade, MD;  Location: [facility] OR;  Service: Vascular;  Laterality: Left;   SMALL INTESTINE SURGERY       Medications Prior to Admission: Prior to Admission medications   Medication Sig Start Date End Date Taking? Authorizing Provider  albuterol  (PROVENTIL  HFA;VENTOLIN  HFA) 108 (90 Base) MCG/ACT inhaler Inhale 2  puffs into the lungs every 6 (six) hours as needed for wheezing or shortness of breath. 01/24/17  Yes Alfornia Madison, MD  amitriptyline  (ELAVIL ) 25 MG tablet Take 25 mg by mouth at bedtime. 12/13/23  Yes [provider]  amLODipine  (NORVASC ) 10 MG tablet Take 10 mg by mouth at bedtime.   Yes [provider]  atorvastatin  (LIPITOR) 20 MG tablet Take 20 mg by mouth at  bedtime.   Yes [provider]  brimonidine -timolol  (COMBIGAN ) 0.2-0.5 % ophthalmic solution Place 1 drop into both eyes every 12 (twelve) hours.   Yes [provider]  carvedilol  (COREG ) 25 MG tablet Take 1 tablet (25 mg total) by mouth 2 (two) times daily with a meal. 04/24/19  Yes Mullis, Kiersten P, DO  hydrALAZINE  (APRESOLINE ) 100 MG tablet Take 1 tablet (100 mg total) by mouth every 8 (eight) hours. 01/06/23  Yes Patsy Lenis, MD  magnesium  oxide (MAG-OX) 400 MG tablet Take 800 mg by mouth 2 (two) times daily. 01/28/24  Yes [provider]  mycophenolate  (CELLCEPT ) 200 MG/ML suspension Take 1,000 mg by mouth 2 (two) times a day. 03/20/18  Yes [provider]  predniSONE  5 MG/5ML solution Take 5 mg by mouth daily. 11/18/18  Yes [provider]  sulfamethoxazole -trimethoprim  (BACTRIM ) 200-40 MG/5ML suspension Take 10 mLs by mouth 3 (three) times a week. Take 10 ml by mouth three times a week on M-W-F. 02/04/24  Yes [provider]  tacrolimus  (PROGRAF ) 1 MG capsule Take 2-3 mg by mouth See admin instructions. Take 3 mg by mouth in the morning and 2 mg at bedtime   Yes [provider]  VYZULTA  0.024 % SOLN Place 1 drop into both eyes at bedtime.   Yes [provider]  fluticasone  (FLOVENT  HFA) 44 MCG/ACT inhaler Inhale 1 puff into the lungs 2 (two) times daily. Patient not taking: No sig reported 03/08/17 03/04/21  Shellia Oh, MD     Allergies:   No Known Allergies  Social History:   Social History   Socioeconomic History   Marital status: Married    Spouse name: Not on file   Number of children: 4   Years of education: Not on file   Highest education level: Not on file  Occupational History   Occupation: DISABILITY    Employer: UNEMPLOYED  Tobacco Use   Smoking status: Never   Smokeless tobacco: Never  Vaping Use   Vaping status: Never Used  Substance and Sexual Activity   Alcohol use: No    Alcohol/week: 0.0  standard drinks of alcohol   Drug use: No   Sexual activity: Not on file  Other Topics Concern   Not on file  Social History Narrative   Not on file   Social Drivers of Health   Financial Resource Strain: Not on file  Food Insecurity: Food Insecurity Present (08/14/2023)   Hunger Vital Sign    Worried About Running Out of Food in the Last Year: Sometimes true    Ran Out of Food in the Last Year: Sometimes true  Transportation Needs: No Transportation Needs (08/14/2023)   PRAPARE - Administrator, Civil Service (Medical): No    Lack of Transportation (Non-Medical): No  Physical Activity: Not on file  Stress: Not on file  Social Connections: Moderately Integrated (08/14/2023)   Social Connection and Isolation Panel    Frequency of Communication with Friends and Family: Twice a week    Frequency of Social Gatherings with Friends and Family:  Twice a week    Attends Religious Services: 1 to 4 times per year    Active Member of Clubs or Organizations: Yes    Attends Banker Meetings: 1 to 4 times per year    Marital Status: Separated  Intimate Partner Violence: Not At Risk (08/14/2023)   Humiliation, Afraid, Rape, and Kick questionnaire    Fear of Current or Ex-Partner: No    Emotionally Abused: No    Physically Abused: No    Sexually Abused: No    Family History:   The patient's family history includes Aneurysm in his mother; Diabetes in his father; Heart attack in his father; Hypertension in his father, mother, and sister; Other in his brother, mother, and sister.    Review of Systems: [y] = yes, [ ]  = no    General: Weight gain [ ] ; Weight loss [ ] ; Anorexia [ ] ; Fatigue [ ] ; Fever [ ] ; Chills [ ] ; Weakness [ ]   Cardiac: Chest pain/pressure [ ] ; Resting SOB [ ] ; Exertional SOB [ y]; Orthopnea [ y]; Pedal Edema [ ] ; Palpitations [ y]; Syncope [ ] ; Presyncope [ ] ; Paroxysmal nocturnal dyspnea[ ]   Pulmonary: Cough [ ] ; Wheezing[ ] ; Hemoptysis[ ] ; Sputum [ ] ;  Snoring [ ]   GI: Vomiting[ ] ; Dysphagia[ ] ; Melena[ ] ; Hematochezia [ ] ; Heartburn[ ] ; Abdominal pain [ ] ; Constipation [ ] ; Diarrhea [ ] ; BRBPR [ ]   GU: Hematuria[ ] ; Dysuria [ ] ; Nocturia[ ]   Vascular: Pain in legs with walking [ ] ; Pain in feet with lying flat [ ] ; Non-healing sores [ ] ; Stroke [ ] ; TIA [ ] ; Slurred speech [ ] ;  Neuro: Headaches[ ] ; Vertigo[ ] ; Seizures[ ] ; Paresthesias[ ] ;Blurred vision [ ] ; Diplopia [ ] ; Vision changes [ ]   Ortho/Skin: Arthritis [ ] ; Joint pain [ ] ; Muscle pain [ ] ; Joint swelling [ ] ; Back Pain [ ] ; Rash [ ]   Psych: Depression[ ] ; Anxiety[ ]   Heme: Bleeding problems [ ] ; Clotting disorders [ ] ; Anemia [ ]   Endocrine: Diabetes [ ] ; Thyroid dysfunction[ ]   Physical Exam/Data:   Vitals:   04/15/24 2030 04/15/24 2100 04/15/24 2300 04/15/24 2330  BP: (!) 131/96 129/78 121/82 121/82  Pulse: 86 97 84 93  Resp: 19 13 19  (!) 22  Temp:      SpO2: 99% 100% 99% 97%  Weight:      Height:        Intake/Output Summary (Last 24 hours) at 04/15/2024 2353 Last data filed at 04/15/2024 2345 Gross per 24 hour  Intake --  Output 2400 ml  Net -2400 ml   Filed Weights   04/15/24 1612  Weight: 118.1 kg   Body mass index is 35.31 kg/m.  General:  Well nourished, well developed, in no acute distress HEENT: normal Neck: + JVD Vascular: left distal AV fistula with palpable thrill; av fistula present on the right upper arm as well.  Cardiac:  normal S1, S2; RR Lungs:  clear to auscultation bilaterally, no wheezing, rhonchi or rales  Abd: soft, nontender, no hepatomegaly  Ext: no edema Musculoskeletal:  No deformities Skin: warm and dry  Psych:  Normal affect    EKG:  Atrial fibrillation at a rate of 96 bpm with LVH present.   Relevant CV Studies: ECHO 07/2023 1. Consider additional strain imaging or cardiac MRI given degree of LVH.  Left ventricular ejection fraction, by estimation, is 50 to 55%. The left  ventricle has low normal function. The left  ventricular internal  cavity  size was mildly dilated. There is   severe left ventricular hypertrophy.   2. Left atrial size was moderately dilated.   3. The mitral valve is abnormal. No evidence of mitral valve  regurgitation.   4. There is mild calcification of the aortic valve. There is mild  thickening of the aortic valve. Aortic valve regurgitation is mild. Aortic  valve sclerosis is present, with no evidence of aortic valve stenosis.   Laboratory Data: Component     Latest Ref Rng 04/15/2024  WBC     4.0 - 10.5 K/uL 9.1   RBC     4.22 - 5.81 MIL/uL 5.38   Hemoglobin     13.0 - 17.0 g/dL 85.3   Hemoglobin      14.5   HCT     39.0 - 52.0 % 43.0   HCT      46.2   MCV     80.0 - 100.0 fL 85.9   MCH     26.0 - 34.0 pg 27.0   MCHC     30.0 - 36.0 g/dL 68.5   RDW     88.4 - 84.4 % 14.5   Platelets     150 - 400 K/uL 164   nRBC     0.0 - 0.2 % 0.0   Sodium     135 - 145 mmol/L 140   Potassium     3.5 - 5.1 mmol/L 4.4   Chloride     98 - 111 mmol/L 107   BUN     8 - 23 mg/dL 10   Creatinine     9.38 - 1.24 mg/dL 8.49 (H)   Glucose     70 - 99 mg/dL 763 (H)   Calcium  Ionized     1.15 - 1.40 mmol/L 1.15   TCO2     22 - 32 mmol/L 23     Radiology/Studies:  DG Chest 2 View Result Date: 04/15/2024 CLINICAL DATA:  Shortness of breath. EXAM: CHEST - 2 VIEW COMPARISON:  08/12/2023 FINDINGS: Chronic cardiomegaly. Vascular congestion without pulmonary edema. No focal airspace disease, large pleural effusion or pneumothorax. No acute osseous findings. IMPRESSION: Chronic cardiomegaly with vascular congestion. Electronically Signed   By: Andrea Gasman M.D.   On: 04/15/2024 18:00    Assessment and Plan:  Mr. Thrall presents to the ED today for elevated heart rate and found to be back in atrial fibrillation found to be in heart failure.   Acute on Chronic Heart Failure Exacerbation Mr. Mian presented to the ED with heart failure. He has no peripheral edema, but does  have some jugular venous pressure elevation. This is likely exacerbated due to him going back into atrial fibrillation. He has quite significant LVH which likely limits his ability to tolerate afib despite controlled rates. It is very unclear to me why he has severe LVH. HTN was the cause of ESRD, but his blood pressures have been more controlled.  - Diuresis tonight ( 80 IV lasix  in the ED)  - 80 IV lasix  in the AM  - If he has HF exacerbations in the absense of afib, we SGLT2i should be considered.  - cMRI as outpatient  Atrial fibrillation Rate Controlled  He was briefly taken off a DOAC during his January admission, and it was not communicated to him to resume the medication. He was been off DOAC for 9 months. He would benefit from heparin  gtt tonight with DCCV tomorrow and resume DOAC after -  Last EKG  08/29/2023 showed normal sinus rhythm - Heparin  gtt - DCCV tomorrow once therapeutic - Continue carvedilol  as he is currently rate controlled with rates  80-105 bpm on telemetry    ESRD s/p Kidney Transplant: Continue mycophenolate  and tacrolimus   Hypertension: Continue amlodipine  10 mg, hydralazine  100 mg Q8H, carvedilol  25 mg   Severity of Illness: The appropriate patient status for this patient is INPATIENT. Inpatient status is judged to be reasonable and necessary in order to provide the required intensity of service to ensure the patient's safety. The patient's presenting symptoms, physical exam findings, and initial radiographic and laboratory data in the context of their chronic comorbidities is felt to place them at high risk for further clinical deterioration. Furthermore, it is not anticipated that the patient will be medically stable for discharge from the hospital within 2 midnights of admission.   * I certify that at the point of admission it is my clinical judgment that the patient will require inpatient hospital care spanning beyond 2 midnights from the point of admission due to  high intensity of service, high risk for further deterioration and high frequency of surveillance required.*   For questions or updates, please contact  [facility] Please consult [email or web address] for contact info under        Signed, Merlene JAYSON Blood, MD  04/15/2024 11:53 PM   Merlene Blood, MD MS  Cardiology [employer]

## 2024-04-15 NOTE — H&P (Incomplete)
 Cardiology Admission History and Physical: ***   Patient ID: Jon [name] MRN: [id number]; DOB: 21-Jan-1956   Admission date: 04/15/2024  Primary Care Provider: [facility] Primary Cardiologist: Darryle ONEIDA Decent, MD *** Primary Electrophysiologist:  None ***  Chief Complaint:  ***  Patient Profile:   Jon [name] is a 68 y.o. male with ***  History of Present Illness:   Jon [name] ***   Past Medical History:  Diagnosis Date  . Chronic kidney disease   . Dialysis patient (HCC)   . Dyspnea   . GERD (gastroesophageal reflux disease)   . History of blood transfusion   . Hyperlipidemia    takes Atorvastatin  daily  . Hypertension    takes Amlodipine ,Losartan ,and Labetalol  daily  . Myocardial infarction [facility])    lived in [place]  maybe 7 years ago  . Small bowel obstruction [facility])     Past Surgical History:  Procedure Laterality Date  . ARTERIOVENOUS GRAFT PLACEMENT    . AV FISTULA PLACEMENT Right 10/25/2015   Procedure: ARTERIOVENOUS (AV) FISTULA CREATION RIGHT FOREARM;  Surgeon: Lonni GORMAN Blade, MD;  Location: [facility] OR;  Service: Vascular;  Laterality: Right;  . BOTOX  INJECTION N/A 08/07/2021   Procedure: cystoscopy BOTOX  INJECTION;  Surgeon: Carolee Sherwood [name] DOUGLAS, MD;  Location: [facility] ORS;  Service: Urology;  Laterality: N/A;  . CARDIAC CATHETERIZATION N/A 05/05/2015   Procedure: Left Heart Cath and Coronary Angiography;  Surgeon: Rober Chroman, MD;  Location: [facility] INVASIVE CV LAB;  Service: Cardiovascular;  Laterality: N/A;  . LIGATION OF ARTERIOVENOUS  FISTULA Left 01/06/2019   Procedure: EXCISION OF ANEURYSMAL  ARTERIOVENOUS  FISTULA LEFT ARM;  Surgeon: Blade Lonni GORMAN, MD;  Location: [facility] OR;  Service: Vascular;  Laterality: Left;  . PERIPHERAL VASCULAR CATHETERIZATION N/A 08/18/2015   Procedure: Fistulagram;  Surgeon: Redell LITTIE Door, MD;  Location: [facility] INVASIVE CV LAB;  Service: Cardiovascular;  Laterality: N/A;  . PERIPHERAL VASCULAR CATHETERIZATION Right 04/19/2016    Procedure: Fistulagram;  Surgeon: Redell LITTIE Door, MD;  Location: [facility] INVASIVE CV LAB;  Service: Cardiovascular;  Laterality: Right;  . RESECTION OF ARTERIOVENOUS FISTULA ANEURYSM Left 02/05/2013   Procedure: REPAIR OF ANEURYSM OF LEFT ARM ARTERIOVENOUS FISTULA ;  Surgeon: Gaile LELON New, MD;  Location: [facility] OR;  Service: Vascular;  Laterality: Left;  . RESECTION OF ARTERIOVENOUS FISTULA ANEURYSM Left 03/08/2015   Procedure: REPAIR OF LEFT ARTERIOVENOUS FISTULA PSEUDOANEURYSM;  Surgeon: Lonni GORMAN Blade, MD;  Location: [facility] OR;  Service: Vascular;  Laterality: Left;  . REVISON OF ARTERIOVENOUS FISTULA Left 06/02/2015   Procedure: PLICATION OF A LARGE ANEURYSM LEFT UPPER ARM BRACHIO-CEPHALIC ARTERIOVENOUS FISTULA ;  Surgeon: Lonni GORMAN Blade, MD;  Location: [facility] OR;  Service: Vascular;  Laterality: Left;  . SMALL INTESTINE SURGERY       Medications Prior to Admission: Prior to Admission medications   Medication Sig Start Date End Date Taking? Authorizing Provider  albuterol  (PROVENTIL  HFA;VENTOLIN  HFA) 108 (90 Base) MCG/ACT inhaler Inhale 2 puffs into the lungs every 6 (six) hours as needed for wheezing or shortness of breath. 01/24/17  Yes Alfornia Madison, MD  amitriptyline  (ELAVIL ) 25 MG tablet Take 25 mg by mouth at bedtime. 12/13/23  Yes [provider]  amLODipine  (NORVASC ) 10 MG tablet Take 10 mg by mouth at bedtime.   Yes [provider]  atorvastatin  (LIPITOR) 20 MG tablet Take 20 mg by mouth at bedtime.   Yes [provider]  brimonidine -timolol  (COMBIGAN ) 0.2-0.5 % ophthalmic solution Place 1 drop into both eyes every 12 (  twelve) hours.   Yes [provider]  carvedilol  (COREG ) 25 MG tablet Take 1 tablet (25 mg total) by mouth 2 (two) times daily with a meal. 04/24/19  Yes Mullis, Kiersten P, DO  hydrALAZINE  (APRESOLINE ) 100 MG tablet Take 1 tablet (100 mg total) by mouth every 8 (eight) hours. 01/06/23  Yes Patsy Lenis, MD  magnesium  oxide (MAG-OX) 400  MG tablet Take 800 mg by mouth 2 (two) times daily. 01/28/24  Yes [provider]  mycophenolate  (CELLCEPT ) 200 MG/ML suspension Take 1,000 mg by mouth 2 (two) times a day. 03/20/18  Yes [provider]  predniSONE  5 MG/5ML solution Take 5 mg by mouth daily. 11/18/18  Yes [provider]  sulfamethoxazole -trimethoprim  (BACTRIM ) 200-40 MG/5ML suspension Take 10 mLs by mouth 3 (three) times a week. Take 10 ml by mouth three times a week on M-W-F. 02/04/24  Yes [provider]  tacrolimus  (PROGRAF ) 1 MG capsule Take 2-3 mg by mouth See admin instructions. Take 3 mg by mouth in the morning and 2 mg at bedtime   Yes [provider]  VYZULTA  0.024 % SOLN Place 1 drop into both eyes at bedtime.   Yes [provider]  fluticasone  (FLOVENT  HFA) 44 MCG/ACT inhaler Inhale 1 puff into the lungs 2 (two) times daily. Patient not taking: No sig reported 03/08/17 03/04/21  Shellia Oh, MD     Allergies:   No Known Allergies  Social History:   Social History   Socioeconomic History  . Marital status: Married    Spouse name: Not on file  . Number of children: 4  . Years of education: Not on file  . Highest education level: Not on file  Occupational History  . Occupation: DISABILITY    Employer: UNEMPLOYED  Tobacco Use  . Smoking status: Never  . Smokeless tobacco: Never  Vaping Use  . Vaping status: Never Used  Substance and Sexual Activity  . Alcohol use: No    Alcohol/week: 0.0 standard drinks of alcohol  . Drug use: No  . Sexual activity: Not on file  Other Topics Concern  . Not on file  Social History Narrative  . Not on file   Social Drivers of Health   Financial Resource Strain: Not on file  Food Insecurity: Food Insecurity Present (08/14/2023)   Hunger Vital Sign   . Worried About Programme researcher, broadcasting/film/video in the Last Year: Sometimes true   . Ran Out of Food in the Last Year: Sometimes true  Transportation Needs: No Transportation Needs  (08/14/2023)   PRAPARE - Transportation   . Lack of Transportation (Medical): No   . Lack of Transportation (Non-Medical): No  Physical Activity: Not on file  Stress: Not on file  Social Connections: Moderately Integrated (08/14/2023)   Social Connection and Isolation Panel   . Frequency of Communication with Friends and Family: Twice a week   . Frequency of Social Gatherings with Friends and Family: Twice a week   . Attends Religious Services: 1 to 4 times per year   . Active Member of Clubs or Organizations: Yes   . Attends Banker Meetings: 1 to 4 times per year   . Marital Status: Separated  Intimate Partner Violence: Not At Risk (08/14/2023)   Humiliation, Afraid, Rape, and Kick questionnaire   . Fear of Current or Ex-Partner: No   . Emotionally Abused: No   . Physically Abused: No   . Sexually Abused: No    Family History:  ***  The patient's family history includes Aneurysm in his mother; Diabetes in his father; Heart attack in his father; Hypertension in his father, mother, and sister; Other in his brother, mother, and sister.    Review of Systems: [y] = yes, [ ]  = no    General: Weight gain [ ] ; Weight loss [ ] ; Anorexia [ ] ; Fatigue [ ] ; Fever [ ] ; Chills [ ] ; Weakness [ ]   Cardiac: Chest pain/pressure [ ] ; Resting SOB [ ] ; Exertional SOB [ ] ; Orthopnea [ ] ; Pedal Edema [ ] ; Palpitations [ ] ; Syncope [ ] ; Presyncope [ ] ; Paroxysmal nocturnal dyspnea[ ]   Pulmonary: Cough [ ] ; Wheezing[ ] ; Hemoptysis[ ] ; Sputum [ ] ; Snoring [ ]   GI: Vomiting[ ] ; Dysphagia[ ] ; Melena[ ] ; Hematochezia [ ] ; Heartburn[ ] ; Abdominal pain [ ] ; Constipation [ ] ; Diarrhea [ ] ; BRBPR [ ]   GU: Hematuria[ ] ; Dysuria [ ] ; Nocturia[ ]   Vascular: Pain in legs with walking [ ] ; Pain in feet with lying flat [ ] ; Non-healing sores [ ] ; Stroke [ ] ; TIA [ ] ; Slurred speech [ ] ;  Neuro: Headaches[ ] ; Vertigo[ ] ; Seizures[ ] ; Paresthesias[ ] ;Blurred vision [ ] ; Diplopia [ ] ; Vision changes [ ]    Ortho/Skin: Arthritis [ ] ; Joint pain [ ] ; Muscle pain [ ] ; Joint swelling [ ] ; Back Pain [ ] ; Rash [ ]   Psych: Depression[ ] ; Anxiety[ ]   Heme: Bleeding problems [ ] ; Clotting disorders [ ] ; Anemia [ ]   Endocrine: Diabetes [ ] ; Thyroid dysfunction[ ]   Physical Exam/Data:   Vitals:   04/15/24 2030 04/15/24 2100 04/15/24 2300 04/15/24 2330  BP: (!) 131/96 129/78 121/82 121/82  Pulse: 86 97 84 93  Resp: 19 13 19  (!) 22  Temp:      SpO2: 99% 100% 99% 97%  Weight:      Height:        Intake/Output Summary (Last 24 hours) at 04/15/2024 2353 Last data filed at 04/15/2024 2345 Gross per 24 hour  Intake --  Output 2400 ml  Net -2400 ml   Filed Weights   04/15/24 1612  Weight: 118.1 kg   Body mass index is 35.31 kg/m.  General:  Well nourished, well developed, in no acute distress*** HEENT: normal Neck: no*** JVD Vascular: No carotid bruits; FA pulses 2+ bilaterally without bruits  Cardiac:  normal S1, S2; RRR; no murmur *** Lungs:  clear to auscultation bilaterally, no wheezing, rhonchi or rales  Abd: soft, nontender, no hepatomegaly  Ext: no*** edema Musculoskeletal:  No deformities Skin: warm and dry  Psych:  Normal affect    EKG:  The ECG that was done *** was personally reviewed and demonstrates ***  Relevant CV Studies: ***  Laboratory Data: ***  Radiology/Studies:  DG Chest 2 View Result Date: 04/15/2024 CLINICAL DATA:  Shortness of breath. EXAM: CHEST - 2 VIEW COMPARISON:  08/12/2023 FINDINGS: Chronic cardiomegaly. Vascular congestion without pulmonary edema. No focal airspace disease, large pleural effusion or pneumothorax. No acute osseous findings. IMPRESSION: Chronic cardiomegaly with vascular congestion. Electronically Signed   By: Andrea Gasman M.D.   On: 04/15/2024 18:00    Assessment and Plan:   Acute on Chronic Heart Failure Excerbation  Atrial fibrillation- Rate Controlled  ESRD s/p Kidney Transplant  Hypertension: Continue amlodipine  10 mg,  hydralazine  100 mg Q8H, carvedilol  25 mg   Severity of Illness: The appropriate patient status for this patient is INPATIENT. Inpatient status is judged to be reasonable and necessary in order to provide the required intensity  of service to ensure the patient's safety. The patient's presenting symptoms, physical exam findings, and initial radiographic and laboratory data in the context of their chronic comorbidities is felt to place them at high risk for further clinical deterioration. Furthermore, it is not anticipated that the patient will be medically stable for discharge from the hospital within 2 midnights of admission.   * I certify that at the point of admission it is my clinical judgment that the patient will require inpatient hospital care spanning beyond 2 midnights from the point of admission due to high intensity of service, high risk for further deterioration and high frequency of surveillance required.*   For questions or updates, please contact [facility] Please consult [email or web address] for contact info under        Signed, Merlene JAYSON Blood, MD  04/15/2024 11:53 PM   Merlene Blood, MD MS  Cardiology [facility]

## 2024-04-16 ENCOUNTER — Inpatient Hospital Stay (HOSPITAL_COMMUNITY)

## 2024-04-16 ENCOUNTER — Inpatient Hospital Stay (HOSPITAL_COMMUNITY): Admitting: Anesthesiology

## 2024-04-16 ENCOUNTER — Encounter (HOSPITAL_COMMUNITY)
Admission: EM | Disposition: A | Discharge status: Home / Self Care | Payer: Self-pay | Source: Home / Self Care | Attending: Student in an Organized Health Care Education/Training Program

## 2024-04-16 ENCOUNTER — Encounter (HOSPITAL_COMMUNITY): Payer: Self-pay | Admitting: Student in an Organized Health Care Education/Training Program

## 2024-04-16 DIAGNOSIS — I110 Hypertensive heart disease with heart failure: Secondary | ICD-10-CM

## 2024-04-16 DIAGNOSIS — Z940 Kidney transplant status: Secondary | ICD-10-CM | POA: Diagnosis not present

## 2024-04-16 DIAGNOSIS — I2510 Atherosclerotic heart disease of native coronary artery without angina pectoris: Secondary | ICD-10-CM

## 2024-04-16 DIAGNOSIS — I4819 Other persistent atrial fibrillation: Secondary | ICD-10-CM | POA: Diagnosis not present

## 2024-04-16 DIAGNOSIS — N1831 Chronic kidney disease, stage 3a: Secondary | ICD-10-CM | POA: Diagnosis not present

## 2024-04-16 DIAGNOSIS — I5031 Acute diastolic (congestive) heart failure: Secondary | ICD-10-CM

## 2024-04-16 DIAGNOSIS — I4891 Unspecified atrial fibrillation: Secondary | ICD-10-CM

## 2024-04-16 DIAGNOSIS — I480 Paroxysmal atrial fibrillation: Secondary | ICD-10-CM | POA: Diagnosis not present

## 2024-04-16 HISTORY — PX: TRANSESOPHAGEAL ECHOCARDIOGRAM (CATH LAB): EP1270

## 2024-04-16 HISTORY — PX: CARDIOVERSION: EP1203

## 2024-04-16 LAB — ECHO TEE
AR max vel: 2.21 cm2
AV Area VTI: 2.09 cm2
AV Area mean vel: 2.55 cm2
AV Mean grad: 4.2 mmHg
AV Peak grad: 9.9 mmHg
Ao pk vel: 1.57 m/s
Area-P 1/2: 3.03 cm2

## 2024-04-16 LAB — CBC
HCT: 44.4 % (ref 39.0–52.0)
Hemoglobin: 13.9 g/dL (ref 13.0–17.0)
MCH: 26.8 pg (ref 26.0–34.0)
MCHC: 31.3 g/dL (ref 30.0–36.0)
MCV: 85.7 fL (ref 80.0–100.0)
Platelets: 176 K/uL (ref 150–400)
RBC: 5.18 MIL/uL (ref 4.22–5.81)
RDW: 14.4 % (ref 11.5–15.5)
WBC: 9.5 K/uL (ref 4.0–10.5)
nRBC: 0 % (ref 0.0–0.2)

## 2024-04-16 LAB — BASIC METABOLIC PANEL WITH GFR
Anion gap: 13 (ref 5–15)
BUN: 9 mg/dL (ref 8–23)
CO2: 24 mmol/L (ref 22–32)
Calcium: 9 mg/dL (ref 8.9–10.3)
Chloride: 102 mmol/L (ref 98–111)
Creatinine, Ser: 1.44 mg/dL — ABNORMAL HIGH (ref 0.61–1.24)
GFR, Estimated: 54 mL/min — ABNORMAL LOW (ref >60)
Glucose, Bld: 136 mg/dL — ABNORMAL HIGH (ref 70–99)
Potassium: 3.6 mmol/L (ref 3.5–5.1)
Sodium: 139 mmol/L (ref 135–145)

## 2024-04-16 LAB — HIV ANTIBODY (ROUTINE TESTING W REFLEX): HIV Screen 4th Generation wRfx: NONREACTIVE

## 2024-04-16 SURGERY — TRANSESOPHAGEAL ECHOCARDIOGRAM (TEE) (CATHLAB)
Anesthesia: Monitor Anesthesia Care

## 2024-04-16 MED ORDER — PHENYLEPHRINE HCL (PRESSORS) 10 MG/ML IV SOLN
INTRAVENOUS | Status: DC | PRN
Start: 2024-04-16 — End: 2024-04-16
  Administered 2024-04-16 (×3): 160 ug via INTRAVENOUS

## 2024-04-16 MED ORDER — FUROSEMIDE 10 MG/ML IJ SOLN
40.0000 mg | Freq: Two times a day (BID) | INTRAMUSCULAR | Status: DC
Start: 2024-04-16 — End: 2024-04-17
  Administered 2024-04-16 – 2024-04-17 (×2): 40 mg via INTRAVENOUS
  Filled 2024-04-16 (×2): qty 4

## 2024-04-16 MED ORDER — MELATONIN 5 MG PO TABS
5.0000 mg | ORAL_TABLET | Freq: Every evening | ORAL | Status: DC | PRN
  Administered 2024-04-16 – 2024-04-17 (×2): 5 mg via ORAL
  Filled 2024-04-16 (×2): qty 1

## 2024-04-16 MED ORDER — APIXABAN 5 MG PO TABS
10.0000 mg | ORAL_TABLET | Freq: Once | ORAL | Status: AC
  Administered 2024-04-16: 10 mg via ORAL
  Filled 2024-04-16: qty 2

## 2024-04-16 MED ORDER — APIXABAN 5 MG PO TABS
5.0000 mg | ORAL_TABLET | Freq: Two times a day (BID) | ORAL | Status: DC
  Administered 2024-04-16 – 2024-04-18 (×4): 5 mg via ORAL
  Filled 2024-04-16 (×4): qty 1

## 2024-04-16 MED ORDER — HEPARIN BOLUS VIA INFUSION
4000.0000 [IU] | Freq: Once | INTRAVENOUS | Status: AC
  Administered 2024-04-16: 4000 [IU] via INTRAVENOUS
  Filled 2024-04-16: qty 4000

## 2024-04-16 MED ORDER — SODIUM CHLORIDE 0.9 % IV SOLN: INTRAVENOUS | Status: DC

## 2024-04-16 MED ORDER — TACROLIMUS 1 MG PO CAPS
2.0000 mg | ORAL_CAPSULE | Freq: Every day | ORAL | Status: DC
  Administered 2024-04-16 – 2024-04-17 (×2): 2 mg via ORAL
  Filled 2024-04-16 (×2): qty 2

## 2024-04-16 MED ORDER — AMIODARONE HCL 200 MG PO TABS: 200.0000 mg | ORAL_TABLET | Freq: Every day | ORAL | Status: DC

## 2024-04-16 MED ORDER — AMIODARONE HCL 200 MG PO TABS
400.0000 mg | ORAL_TABLET | Freq: Two times a day (BID) | ORAL | Status: DC
  Administered 2024-04-16 – 2024-04-18 (×5): 400 mg via ORAL
  Filled 2024-04-16 (×5): qty 2

## 2024-04-16 MED ORDER — SPIRONOLACTONE 12.5 MG HALF TABLET
12.5000 mg | ORAL_TABLET | Freq: Every day | ORAL | Status: DC
  Administered 2024-04-16 – 2024-04-18 (×3): 12.5 mg via ORAL
  Filled 2024-04-16 (×3): qty 1

## 2024-04-16 MED ORDER — PROPOFOL 500 MG/50ML IV EMUL
INTRAVENOUS | Status: DC | PRN
  Administered 2024-04-16: 150 ug/kg/min via INTRAVENOUS

## 2024-04-16 MED ORDER — HEPARIN (PORCINE) 25000 UT/250ML-% IV SOLN
1500.0000 [IU]/h | INTRAVENOUS | Status: DC
  Administered 2024-04-16: 1500 [IU]/h via INTRAVENOUS
  Filled 2024-04-16: qty 250

## 2024-04-16 MED ORDER — CALCIUM CARBONATE ANTACID 500 MG PO CHEW
1.0000 | CHEWABLE_TABLET | Freq: Three times a day (TID) | ORAL | Status: DC | PRN
  Administered 2024-04-16: 200 mg via ORAL
  Filled 2024-04-16: qty 1

## 2024-04-16 MED ORDER — TACROLIMUS 1 MG PO CAPS
3.0000 mg | ORAL_CAPSULE | Freq: Every morning | ORAL | Status: DC
  Administered 2024-04-16 – 2024-04-18 (×3): 3 mg via ORAL
  Filled 2024-04-16 (×4): qty 3

## 2024-04-16 MED ORDER — POTASSIUM CHLORIDE CRYS ER 20 MEQ PO TBCR
40.0000 meq | EXTENDED_RELEASE_TABLET | Freq: Once | ORAL | Status: AC
  Administered 2024-04-16: 40 meq via ORAL
  Filled 2024-04-16: qty 2

## 2024-04-16 MED ORDER — LIDOCAINE 2% (20 MG/ML) 5 ML SYRINGE
INTRAMUSCULAR | Status: DC | PRN
  Administered 2024-04-16: 60 mg via INTRAVENOUS

## 2024-04-16 SURGICAL SUPPLY — 1 items: PAD DEFIB RADIO PHYSIO CONN (PAD) ×1 IMPLANT

## 2024-04-16 NOTE — Interval H&P Note (Signed)
 History and Physical Interval Note:  04/16/2024 9:49 AM  Jon [name]  has presented today for surgery, with the diagnosis of atrial fibrillation.  The various methods of treatment have been discussed with the patient and family. After consideration of risks, benefits and other options for treatment, the patient has consented to  Procedure(s): TRANSESOPHAGEAL ECHOCARDIOGRAM (N/A) CARDIOVERSION (N/A) as a surgical intervention.  The patient's history has been reviewed, patient examined, no change in status, stable for surgery.  I have reviewed the patient's chart and labs.  Questions were answered to the patient's satisfaction.     Eline Geng A Dhruvan Gullion

## 2024-04-16 NOTE — H&P (View-Only) (Signed)
 Cardiology Progress Note  Patient ID: Jon [name] MRN: [id number] DOB: 10-01-1955 Date of Encounter: 04/16/2024 Primary Cardiologist: Darryle ONEIDA Decent, MD  Subjective   Chief Complaint: SOB  HPI: Admitted with shortness of breath, diastolic heart failure and recurrence of atrial fibrillation.  Received 80 of IV Lasix .  Good urine output.  Still short of breath.  Remains in A-fib.  ROS:  All other ROS reviewed and negative. Pertinent positives noted in the HPI.     Telemetry  Overnight telemetry shows A-fib 90 to 100 bpm, which I personally reviewed.   ECG  The most recent ECG shows A-fib heart rate 96, left anterior fascicular block, which I personally reviewed.   Physical Exam   Vitals:   04/16/24 0144 04/16/24 0700 04/16/24 0715 04/16/24 0803  BP:  (!) 133/90    Pulse:  (!) 51 93   Resp:  (!) 24 18   Temp: 97.9 F (36.6 C)   97.8 F (36.6 C)  TempSrc: Oral   Oral  SpO2:  100% 100%   Weight:      Height:        Intake/Output Summary (Last 24 hours) at 04/16/2024 0829 Last data filed at 04/16/2024 0141 Gross per 24 hour  Intake --  Output 2800 ml  Net -2800 ml       04/15/2024    4:12 PM 08/29/2023    8:47 AM 08/29/2023    8:00 AM  Last 3 Weights  Weight (lbs) 260 lb 5.8 oz 260 lb 6.4 oz 246 lb 14.6 oz  Weight (kg) 118.1 kg 118.117 kg 112 kg    Body mass index is 35.31 kg/m.  General: Well nourished, well developed, in no acute distress Head: Atraumatic, normal size  Eyes: PEERLA, EOMI  Neck: Supple, JVD 7-8 cmH2O Endocrine: No thryomegaly Cardiac: Normal S1, S2; irregular rhythm, no murmurs Lungs: Clear to auscultation bilaterally, no wheezing, rhonchi or rales  Abd: Soft, nontender, no hepatomegaly  Ext: No edema, pulses 2+ Musculoskeletal: No deformities, BUE and BLE strength normal and equal Skin: Warm and dry, no rashes   Neuro: Alert and oriented to person, place, time, and situation, CNII-XII grossly intact, no focal deficits  Psych: Normal mood and  affect   Cardiac Studies  TTE 08/16/2023  1. Consider additional strain imaging or cardiac MRI given degree of LVH.  Left ventricular ejection fraction, by estimation, is 50 to 55%. The left  ventricle has low normal function. The left ventricular internal cavity  size was mildly dilated. There is   severe left ventricular hypertrophy.   2. Left atrial size was moderately dilated.   3. The mitral valve is abnormal. No evidence of mitral valve  regurgitation.   4. There is mild calcification of the aortic valve. There is mild  thickening of the aortic valve. Aortic valve regurgitation is mild. Aortic  valve sclerosis is present, with no evidence of aortic valve stenosis.   Patient Profile  Jon [name] is a 68 y.o. male with ESRD status post renal transplant, HFpEF, paroxysmal atrial fibrillation, hypertension, severe LVH admitted on 04/15/2024 for acute on chronic diastolic heart failure and recurrence of atrial fibrillation.  Assessment & Plan   # Acute on chronic diastolic heart failure # Severe LVH - Received 80 mg of IV Lasix .  Net -2.8 L.  Suspect he will need 1 more day of IV diuresis but his volume status is much improved. - I do think a lot of his symptoms are coming from his A-fib.  See discussion below. - Severe LVH initially thought to be due to hypertension.  Will need outpatient cardiac MRI for amyloidosis and further workup of this. - Aspirin  lactone 12.5 mg daily. -Replace potassium. - Add SGLT2 inhibitor after TEE/cardioversion.  # Paroxysmal A-fib - Has been off his Eliquis .  I think a lot of his symptoms are from his A-fib.  Good diuresis today. - We will give Eliquis  to 10 mg one-time dose.  Resume 5 mg twice daily tonight.  We will proceed with TEE/cardioversion.  Discussed procedure.  He is willing to proceed. - In the meantime continue carvedilol  25 mg twice daily. - Echo is concerning for possible amyloid.  We will likely pursue rhythm control strategy with  amiodarone .  Informed Consent   Shared Decision Making/Informed Consent   The risks [stroke, cardiac arrhythmias rarely resulting in the need for a temporary or permanent pacemaker, skin irritation or burns, esophageal damage, perforation (1:10,000 risk), bleeding, pharyngeal hematoma as well as other potential complications associated with conscious sedation including aspiration, arrhythmia, respiratory failure and death], benefits (treatment guidance, restoration of normal sinus rhythm, diagnostic support) and alternatives of a transesophageal echocardiogram guided cardioversion were discussed in detail with Mr. Mercado and he is willing to proceed.     # Elevated troponin, demand ischemia - Troponins are minimally elevated and flat.  EKG is nonischemic. - Nuclear medicine stress test - 08/15/2023 which is reassuring - Continue statin.  Not on aspirin  since he is on Eliquis .  # Pulmonary nodules - Will need follow-up chest CT at some point.  # ESRD status post renal transplant # CKD 3a - Kidney function is stable. - Continue home transplant medications.  # HTN - Continue carvedilol  25 mg twice daily, hydralazine  100 mg 3 times daily, amlodipine  10 mg daily.  Adding spironolactone  to assist with diuresis.  FEN -no IVF -code: full -DVT ppx: eliquis  - Diet: N.p.o. - Disposition: Anticipate discharge tomorrow after TEE/cardioversion today and diuresis.  For questions or updates, please contact [facility] Please consult [email or web address] for contact info under         Signed, Darryle T. Barbaraann, MD, [facility]  [facility]  04/16/2024 8:29 AM

## 2024-04-16 NOTE — Plan of Care (Signed)

## 2024-04-16 NOTE — Discharge Instructions (Addendum)
 Start Furosemide  and Spironolactone  on Monday 04/20/24. We will get a follow up lab on the day you are seen in the office to recheck your kidney function (BMET).   Crisis assistance program Find help for paying your rent, electric bills, free food, and even funds to pay your mortgage. The [employer] (510) 600-7182) offers several services to local families, as funding allows. The Emergency Assistance Program (EAP), which they administer, provides household goods, free food, clothing, and financial aid to people in need in the [place]  area. The EAP program does have some qualification, and counselors will interview clients for financial assistance by written referral only. Referrals need to be made by the Department of Social Services or by other EAP approved human services agencies or charities in the area.  Money for resources for emergency assistance are available for security deposits for rent, water , electric, and gas, past due rent, utility bills, past due mortgage payments, food, and clothing. The [employer] also operates a Programme researcher, broadcasting/film/video on the site. More [employer].  Money for resources for emergency assistance are available for security deposits for rent, water , electric, and gas, past due rent, utility bills, past due mortgage payments, food, and clothing. The [employer] also operates a Programme researcher, broadcasting/film/video on the site. More [employer].  [employer] of [employer], which can be reached at [phone], offers emergency assistance programs for those in need of help, such as food, rent assistance, a soup kitchen, shelter, and clothing. They are based in [facility]  but provide a number of services to those that qualify for assistance. Continue with [employer] programs.  [facility] Department of Social Services may be able to offer temporary financial assistance and cash grants  for paying rent and utilities. Help may be provided for local county residents who may be experiencing personal crisis when other resources, including government programs, are not available. Call ([phone]  [employer], which is based in [place], provides financial assistance of up to $50.00 to help pay for rent, utilities, cooling bills, rent, and prescription medications. The program also provides secondhand furniture to those in need. [phone]  [employer] is a Geneticist, molecular. The organization can offer emergency assistance for paying rent, electric bills, utilities, food, household products and furniture. They offer extensive emergency and transitional housing for families, children and single women, and also run a Boy's and [employer]. [employer], [employer], and other aid offered too. [address], [place], [place]  [address], [phone]  Additional locations of the [employer] are in [place] and other nearby communities. When you have an emergency, need free food, money for basic needs, or just need assistance around Christmas, then the [employer] may have the resources you need. Or they can refer you to nearby agencies. Learn more.  [place] Low Income Risk manager - This is offered for [facility] families. The federal government created [employer] Program provides a one-time cash grant payment to help eligible low-income families pay their electric and heating bills. [address], [place], [place]  [address], [phone]  Government and [employer] - The county administers several emergency and self-sufficiency programs. Residents of [place] can get help with energy bills and food, rent, and other expenses. In addition, work with a Sports coach who may be able to help you find a job  or improve your employment skills. More [place] public  assistance.  [place] Emergency Assistance - A program offers emergency utility and rent funds for greater [place] area residents. The program can also provide counseling and referrals to charities and government programs. Also provides food and a free meal program that serves lunch Mondays - Saturdays and dinner seven days per week to individuals in the community. [address], [place], [place]  [address], [phone]  [name] - Offers affordable apartment and housing communities across [place] and [place]. The low income and seniors can access public housing, rental assistance to qualified applicants, and apply for the section 8 rent subsidy program. Other programs include Chiropractor and Engineer, maintenance. [address], [place], [place]  [address], dial  [phone].  Basic needs such as clothing - Low income families can receive free items (school supplies, clothes, holiday assistance, etc.) from clothing closets while more moderate income [address] families can shop at [employer]. Locations across the area help the needy. Get information on [place] [employer] free clothing centers.  [employer] to End Homelessness is available in [place]. Families that were evicted or that are homeless can gain shelter, food, clothing, furniture, and also emergency financial assistance. Other services include financial skills and life skills coaching, job training, and case management. [address] [place], [place] [address]. Telephone [phone].  The [employer]. runs the [employer]. This can help people save money on their heating and summer cooling bills, and is free to low income families. Free upgrades can be made to your home. Phone [phone]  ----------------------------------------------------------------------- Information on  my medicine - ELIQUIS  (apixaban )  Why was Eliquis  prescribed for you? Eliquis  was prescribed for you to reduce the risk of a blood clot forming that can cause a stroke if you have a medical condition called atrial fibrillation (a type of irregular heartbeat).  What do You need to know about Eliquis  ? Take your Eliquis  TWICE DAILY - one tablet in the morning and one tablet in the evening with or without food. If you have difficulty swallowing the tablet whole please discuss with your pharmacist how to take the medication safely.  Take Eliquis  exactly as prescribed by your doctor and DO NOT stop taking Eliquis  without talking to the doctor who prescribed the medication.  Stopping may increase your risk of developing a stroke.  Refill your prescription before you run out.  After discharge, you should have regular check-up appointments with your healthcare provider that is prescribing your Eliquis .  In the future your dose may need to be changed if your kidney function or weight changes by a significant amount or as you get older.  What do you do if you miss a dose? If you miss a dose, take it as soon as you remember on the same day and resume taking twice daily.  Do not take more than one dose of ELIQUIS  at the same time to make up a missed dose.  Important Safety Information A possible side effect of Eliquis  is bleeding. You should call your healthcare provider right away if you experience any of the following: Bleeding from an injury or your nose that does not stop. Unusual colored urine (red or dark brown) or unusual colored stools (red or black). Unusual bruising for unknown reasons. A serious fall or if you hit your head (even if there is no bleeding).  Some medicines may interact with  Eliquis  and might increase your risk of bleeding or clotting while on Eliquis . To help avoid this, consult your healthcare provider or pharmacist prior to using any new prescription or  non-prescription medications, including herbals, vitamins, non-steroidal anti-inflammatory drugs (NSAIDs) and supplements.  This website has more information on Eliquis  (apixaban ): [email or web address] [email or web address]

## 2024-04-16 NOTE — Transfer of Care (Signed)
 Immediate Anesthesia Transfer of Care Note  Patient: Jon [name]  Procedure(s) Performed: TRANSESOPHAGEAL ECHOCARDIOGRAM CARDIOVERSION  Patient Location: Cath Lab  Anesthesia Type:MAC  Level of Consciousness: sedated  Airway & Oxygen Therapy: Patient Spontanous Breathing and Patient connected to nasal cannula oxygen  Post-op Assessment: Report given to RN and Post -op Vital signs reviewed and stable  Post vital signs: Reviewed and stable  Last Vitals:  Vitals Value Taken Time  BP 105/76 04/16/24 12:07  Temp 36.7 C 04/16/24 12:06  Pulse 69 04/16/24 12:08  Resp 18 04/16/24 12:08  SpO2 90 % 04/16/24 12:08  Vitals shown include unfiled device data.  Last Pain:  Vitals:   04/16/24 1206  TempSrc: Tympanic  PainSc: Asleep         Complications: No notable events documented.

## 2024-04-16 NOTE — TOC CM/SW Note (Signed)
 CSW attached utility resources to patients AVS.

## 2024-04-16 NOTE — Anesthesia Preprocedure Evaluation (Signed)
 Anesthesia Evaluation  Patient identified by MRN, date of birth, ID band Patient awake    Reviewed: Allergy & Precautions, NPO status , Patient's Chart, lab work & pertinent test results, reviewed documented beta blocker date and time   History of Anesthesia Complications Negative for: history of anesthetic complications  Airway Mallampati: II  TM Distance: >3 FB Neck ROM: Full    Dental  (+) Poor Dentition, Missing, Dental Advisory Given   Pulmonary COPD,  COPD inhaler   breath sounds clear to auscultation       Cardiovascular hypertension, Pt. on medications and Pt. on home beta blockers (-) angina + CAD (non-obstructive by cath)  + dysrhythmias Atrial Fibrillation  Rhythm:Irregular Rate:Tachycardia  07/2023 ECHO: EF 50 to 55%.  1. The LV has low normal function. The left ventricular internal cavity size was mildly dilated. There is severe LVH.   2. Left atrial size was moderately dilated.   3. The mitral valve is abnormal. No evidence of mitral valve regurgitation.   4. There is mild calcification of the aortic valve. There is mild thickening of the aortic valve. Aortic valve regurgitation is mild. Aortic valve sclerosis is present, with no evidence of aortic valve stenosis.      Neuro/Psych negative neurological ROS     GI/Hepatic Neg liver ROS,GERD  Controlled,,H/o SBO   Endo/Other  BMI 35  Renal/GU Renal InsufficiencyRenal diseaseH/o dialysis: s/p renal transplant     Musculoskeletal   Abdominal   Peds  Hematology Hb 13.9, plt 176k   Anesthesia Other Findings   Reproductive/Obstetrics                              Anesthesia Physical Anesthesia Plan  ASA: 3  Anesthesia Plan: MAC   Post-op Pain Management: Minimal or no pain anticipated   Induction:   PONV Risk Score and Plan: 1 and Treatment may vary due to age or medical condition  Airway Management Planned: Natural Airway  and Simple Face Mask  Additional Equipment: None  Intra-op Plan:   Post-operative Plan:   Informed Consent: I have reviewed the patients History and Physical, chart, labs and discussed the procedure including the risks, benefits and alternatives for the proposed anesthesia with the patient or authorized representative who has indicated his/her understanding and acceptance.     Dental advisory given  Plan Discussed with: CRNA and Surgeon  Anesthesia Plan Comments:         Anesthesia Quick Evaluation

## 2024-04-16 NOTE — Progress Notes (Signed)
 PHARMACY - ANTICOAGULATION CONSULT NOTE  Pharmacy Consult for heparin  Indication: atrial fibrillation  No Known Allergies  Patient Measurements: Height: 6' (182.9 cm) Weight: 118.1 kg (260 lb 5.8 oz) IBW/kg (Calculated) : 77.6 HEPARIN  DW (KG): 103.3  Vital Signs: Temp: 98.2 F (36.8 C) (09/17 2028) BP: 136/85 (09/18 0021) Pulse Rate: 93 (09/17 2330)  Labs: Recent Labs    04/15/24 1622 04/15/24 1629 04/15/24 1822  HGB 14.5 14.6  --   HCT 46.2 43.0  --   PLT 164  --   --   CREATININE 1.49* 1.50*  --   TROPONINIHS 45*  --  53*    Estimated Creatinine Clearance: 65.1 mL/min (A) (by C-G formula based on SCr of 1.5 mg/dL (H)).   Medical History: Past Medical History:  Diagnosis Date   Chronic kidney disease    Dialysis patient (HCC)    Dyspnea    GERD (gastroesophageal reflux disease)    History of blood transfusion    Hyperlipidemia    takes Atorvastatin  daily   Hypertension    takes Amlodipine ,Losartan ,and Labetalol  daily   Myocardial infarction [facility])    lived in [place]  maybe 7 years ago   Small bowel obstruction (HCC)     Medications:  (Not in a hospital admission)  Scheduled:   amitriptyline   25 mg Oral QHS   amLODipine   10 mg Oral QHS   atorvastatin   20 mg Oral QHS   brimonidine   1 drop Both Eyes BID   And   timolol   1 drop Both Eyes BID   carvedilol   25 mg Oral BID WC   hydrALAZINE   100 mg Oral Q8H   mycophenolate   1,000 mg Oral BID   Infusions:   Assessment: Pt with a hx of afib. He has been off of NOAC for about 9 months. Presented with afib and HF exacerbation. Heparin  has been ordered for anticoagulation. Anticipating DCCV soon.  Scr 1.5 Cbc wnl  Goal of Therapy:  Heparin  level 0.3-0.7 units/ml Monitor platelets by anticoagulation protocol: Yes   Plan:  Heparin  4000 units/hr Heparin  1500 units/hr Check 8 hr HL Daily HL and CBC  Sergio Batch, PharmD, BCIDP, AAHIVP, CPP Infectious Disease Pharmacist 04/16/2024 12:38 AM

## 2024-04-16 NOTE — Anesthesia Postprocedure Evaluation (Signed)
 Anesthesia Post Note  Patient: Animal nutritionist  Procedure(s) Performed: TRANSESOPHAGEAL ECHOCARDIOGRAM CARDIOVERSION     Patient location during evaluation: Endoscopy Anesthesia Type: MAC Level of consciousness: oriented, awake and alert and patient cooperative Pain management: pain level controlled Vital Signs Assessment: post-procedure vital signs reviewed and stable Respiratory status: spontaneous breathing, nonlabored ventilation, respiratory function stable and patient connected to nasal cannula oxygen Cardiovascular status: blood pressure returned to baseline and stable Postop Assessment: no apparent nausea or vomiting Anesthetic complications: no   No notable events documented.  Last Vitals:  Vitals:   04/16/24 1226 04/16/24 1236  BP: (!) 128/94 (!) 133/91  Pulse: 64 75  Resp: 17 18  Temp:    SpO2: 95% 96%    Last Pain:  Vitals:   04/16/24 1236  TempSrc:   PainSc: 0-No pain                 Naisha Wisdom,E. Juleah [place]

## 2024-04-16 NOTE — Progress Notes (Signed)
  Echocardiogram Echocardiogram Transesophageal has been performed.  Koleen KANDICE Popper, RDCS 04/16/2024, 12:19 PM

## 2024-04-16 NOTE — CV Procedure (Signed)
 INDICATIONS: afib  PROCEDURE:   Informed consent was obtained prior to the procedure. The risks, benefits and alternatives for the procedure were discussed and the patient comprehended these risks.  Risks include, but are not limited to, cough, sore throat, vomiting, nausea, somnolence, esophageal and stomach trauma or perforation, bleeding, low blood pressure, aspiration, pneumonia, infection, trauma to the teeth and death.    Procedural time out performed.  During this procedure the patient was administered propofol  per anesthesia.  The patient's heart rate, blood pressure, and oxygen saturation were monitored continuously during the procedure. The period of conscious sedation was 30 minutes, of which I was present face-to-face 100% of this time.  The transesophageal probe was inserted in the esophagus and stomach without difficulty and multiple views were obtained.  The patient was kept under observation until the patient left the procedure room.  The patient left the procedure room in stable condition.   Agitated microbubble saline contrast was administered.  COMPLICATIONS:    There were no immediate complications.  FINDINGS:   FORMAL ECHOCARDIOGRAM REPORT PENDING EF 50-55% Grossly normal valves Very severe LVH, concentric Severe La dilation No LA appendage thrombus   RECOMMENDATIONS:     Proceed to cardioversion  Sedation given: propofol  per anesthesia Pacer pads placed anterior and posterior chest.  Cardioverted 1 time(s).  Cardioversion with synchronized biphasic 200J shock.  Evaluation: Findings: Post procedure EKG shows: NSR Complications: None Patient did tolerate procedure well.  Time Spent Directly with the Patient:  45 minutes   Annamary Buschman A Ramzi Brathwaite 04/16/2024, 12:09 PM

## 2024-04-16 NOTE — Progress Notes (Addendum)
 Cardiology Progress Note  Patient ID: Jon [name] MRN: [id number] DOB: 10-01-1955 Date of Encounter: 04/16/2024 Primary Cardiologist: Darryle ONEIDA Decent, MD  Subjective   Chief Complaint: SOB  HPI: Admitted with shortness of breath, diastolic heart failure and recurrence of atrial fibrillation.  Received 80 of IV Lasix .  Good urine output.  Still short of breath.  Remains in A-fib.  ROS:  All other ROS reviewed and negative. Pertinent positives noted in the HPI.     Telemetry  Overnight telemetry shows A-fib 90 to 100 bpm, which I personally reviewed.   ECG  The most recent ECG shows A-fib heart rate 96, left anterior fascicular block, which I personally reviewed.   Physical Exam   Vitals:   04/16/24 0144 04/16/24 0700 04/16/24 0715 04/16/24 0803  BP:  (!) 133/90    Pulse:  (!) 51 93   Resp:  (!) 24 18   Temp: 97.9 F (36.6 C)   97.8 F (36.6 C)  TempSrc: Oral   Oral  SpO2:  100% 100%   Weight:      Height:        Intake/Output Summary (Last 24 hours) at 04/16/2024 0829 Last data filed at 04/16/2024 0141 Gross per 24 hour  Intake --  Output 2800 ml  Net -2800 ml       04/15/2024    4:12 PM 08/29/2023    8:47 AM 08/29/2023    8:00 AM  Last 3 Weights  Weight (lbs) 260 lb 5.8 oz 260 lb 6.4 oz 246 lb 14.6 oz  Weight (kg) 118.1 kg 118.117 kg 112 kg    Body mass index is 35.31 kg/m.  General: Well nourished, well developed, in no acute distress Head: Atraumatic, normal size  Eyes: PEERLA, EOMI  Neck: Supple, JVD 7-8 cmH2O Endocrine: No thryomegaly Cardiac: Normal S1, S2; irregular rhythm, no murmurs Lungs: Clear to auscultation bilaterally, no wheezing, rhonchi or rales  Abd: Soft, nontender, no hepatomegaly  Ext: No edema, pulses 2+ Musculoskeletal: No deformities, BUE and BLE strength normal and equal Skin: Warm and dry, no rashes   Neuro: Alert and oriented to person, place, time, and situation, CNII-XII grossly intact, no focal deficits  Psych: Normal mood and  affect   Cardiac Studies  TTE 08/16/2023  1. Consider additional strain imaging or cardiac MRI given degree of LVH.  Left ventricular ejection fraction, by estimation, is 50 to 55%. The left  ventricle has low normal function. The left ventricular internal cavity  size was mildly dilated. There is   severe left ventricular hypertrophy.   2. Left atrial size was moderately dilated.   3. The mitral valve is abnormal. No evidence of mitral valve  regurgitation.   4. There is mild calcification of the aortic valve. There is mild  thickening of the aortic valve. Aortic valve regurgitation is mild. Aortic  valve sclerosis is present, with no evidence of aortic valve stenosis.   Patient Profile  Jon [name] is a 68 y.o. male with ESRD status post renal transplant, HFpEF, paroxysmal atrial fibrillation, hypertension, severe LVH admitted on 04/15/2024 for acute on chronic diastolic heart failure and recurrence of atrial fibrillation.  Assessment & Plan   # Acute on chronic diastolic heart failure # Severe LVH - Received 80 mg of IV Lasix .  Net -2.8 L.  Suspect he will need 1 more day of IV diuresis but his volume status is much improved. - I do think a lot of his symptoms are coming from his A-fib.  See discussion below. - Severe LVH initially thought to be due to hypertension.  Will need outpatient cardiac MRI for amyloidosis and further workup of this. - Aspirin  lactone 12.5 mg daily. -Replace potassium. - Add SGLT2 inhibitor after TEE/cardioversion.  # Paroxysmal A-fib - Has been off his Eliquis .  I think a lot of his symptoms are from his A-fib.  Good diuresis today. - We will give Eliquis  to 10 mg one-time dose.  Resume 5 mg twice daily tonight.  We will proceed with TEE/cardioversion.  Discussed procedure.  He is willing to proceed. - In the meantime continue carvedilol  25 mg twice daily. - Echo is concerning for possible amyloid.  We will likely pursue rhythm control strategy with  amiodarone .  Informed Consent   Shared Decision Making/Informed Consent   The risks [stroke, cardiac arrhythmias rarely resulting in the need for a temporary or permanent pacemaker, skin irritation or burns, esophageal damage, perforation (1:10,000 risk), bleeding, pharyngeal hematoma as well as other potential complications associated with conscious sedation including aspiration, arrhythmia, respiratory failure and death], benefits (treatment guidance, restoration of normal sinus rhythm, diagnostic support) and alternatives of a transesophageal echocardiogram guided cardioversion were discussed in detail with Mr. Mercado and he is willing to proceed.     # Elevated troponin, demand ischemia - Troponins are minimally elevated and flat.  EKG is nonischemic. - Nuclear medicine stress test - 08/15/2023 which is reassuring - Continue statin.  Not on aspirin  since he is on Eliquis .  # Pulmonary nodules - Will need follow-up chest CT at some point.  # ESRD status post renal transplant # CKD 3a - Kidney function is stable. - Continue home transplant medications.  # HTN - Continue carvedilol  25 mg twice daily, hydralazine  100 mg 3 times daily, amlodipine  10 mg daily.  Adding spironolactone  to assist with diuresis.  FEN -no IVF -code: full -DVT ppx: eliquis  - Diet: N.p.o. - Disposition: Anticipate discharge tomorrow after TEE/cardioversion today and diuresis.  For questions or updates, please contact [facility] Please consult [email or web address] for contact info under         Signed, Darryle T. Barbaraann, MD, [facility]  [facility]  04/16/2024 8:29 AM

## 2024-04-17 ENCOUNTER — Encounter (HOSPITAL_COMMUNITY): Payer: Self-pay | Admitting: Internal Medicine

## 2024-04-17 ENCOUNTER — Other Ambulatory Visit (HOSPITAL_COMMUNITY): Payer: Self-pay

## 2024-04-17 ENCOUNTER — Telehealth (HOSPITAL_COMMUNITY): Payer: Self-pay | Admitting: Pharmacy Technician

## 2024-04-17 DIAGNOSIS — I480 Paroxysmal atrial fibrillation: Secondary | ICD-10-CM | POA: Diagnosis not present

## 2024-04-17 DIAGNOSIS — N1831 Chronic kidney disease, stage 3a: Secondary | ICD-10-CM | POA: Diagnosis not present

## 2024-04-17 DIAGNOSIS — Z940 Kidney transplant status: Secondary | ICD-10-CM

## 2024-04-17 DIAGNOSIS — I10 Essential (primary) hypertension: Secondary | ICD-10-CM

## 2024-04-17 DIAGNOSIS — I5031 Acute diastolic (congestive) heart failure: Secondary | ICD-10-CM | POA: Diagnosis not present

## 2024-04-17 LAB — CBC
HCT: 43.3 % (ref 39.0–52.0)
Hemoglobin: 13.6 g/dL (ref 13.0–17.0)
MCH: 26.8 pg (ref 26.0–34.0)
MCHC: 31.4 g/dL (ref 30.0–36.0)
MCV: 85.4 fL (ref 80.0–100.0)
Platelets: 151 K/uL (ref 150–400)
RBC: 5.07 MIL/uL (ref 4.22–5.81)
RDW: 14.5 % (ref 11.5–15.5)
WBC: 7.8 K/uL (ref 4.0–10.5)
nRBC: 0 % (ref 0.0–0.2)

## 2024-04-17 LAB — BASIC METABOLIC PANEL WITH GFR
Anion gap: 7 (ref 5–15)
BUN: 14 mg/dL (ref 8–23)
CO2: 23 mmol/L (ref 22–32)
Calcium: 9 mg/dL (ref 8.9–10.3)
Chloride: 107 mmol/L (ref 98–111)
Creatinine, Ser: 1.7 mg/dL — ABNORMAL HIGH (ref 0.61–1.24)
GFR, Estimated: 44 mL/min — ABNORMAL LOW (ref >60)
Glucose, Bld: 136 mg/dL — ABNORMAL HIGH (ref 70–99)
Potassium: 3.7 mmol/L (ref 3.5–5.1)
Sodium: 137 mmol/L (ref 135–145)

## 2024-04-17 MED ORDER — LIDOCAINE 5 % EX PTCH
1.0000 | MEDICATED_PATCH | CUTANEOUS | Status: AC
  Administered 2024-04-17: 1 via TRANSDERMAL
  Filled 2024-04-17: qty 1

## 2024-04-17 MED ORDER — METOPROLOL TARTRATE 100 MG PO TABS
100.0000 mg | ORAL_TABLET | Freq: Two times a day (BID) | ORAL | Status: DC
  Administered 2024-04-17 – 2024-04-18 (×2): 100 mg via ORAL
  Filled 2024-04-17 (×2): qty 1

## 2024-04-17 MED ORDER — FUROSEMIDE 10 MG/ML IJ SOLN: 40.0000 mg | Freq: Two times a day (BID) | INTRAMUSCULAR | Status: DC

## 2024-04-17 MED ORDER — FUROSEMIDE 10 MG/ML IJ SOLN
40.0000 mg | Freq: Two times a day (BID) | INTRAMUSCULAR | Status: DC
  Administered 2024-04-17: 40 mg via INTRAVENOUS
  Filled 2024-04-17 (×2): qty 4

## 2024-04-17 NOTE — Research (Signed)
 Spoke with patient about the cardiac amyloidosis study.  He is interested in the study  I will contact him next week to bring him in for screening.  Left consent with him for his review.   Suzen Hardy :) RN BSN  Clinical Research Nurse Lead Be strong and take heart, all you who hope in the Lord. ~ Psalm 31:24

## 2024-04-17 NOTE — Plan of Care (Signed)

## 2024-04-17 NOTE — Progress Notes (Signed)
 Note for discharge planning: Per PharmD - pt will need new script for eliquis . He threw away the supply he picked up in August.

## 2024-04-17 NOTE — Progress Notes (Signed)
 Heart Failure Navigator Progress Note  Assessed for Heart & Vascular TOC clinic readiness.  Patient does not meet criteria due to has a scheduled [facility] appointment on 04/23/2024. They will start ? Amyloid work up . No HF TOC at this time. .   Navigator available for reassessment of patient.   Stephane Haddock, BSN, Scientist, clinical (histocompatibility and immunogenetics) Only

## 2024-04-17 NOTE — Progress Notes (Addendum)
 Rounding Note   Patient Name: Jon [name] Date of Encounter: 04/17/2024  [facility] Cardiologist: Darryle ONEIDA Decent, MD   Subjective  TEE-DCCV 04/16/24 --> back to Afib RVR at 1351 on 04/16/24  He describes SOB this morning, unable to rest flat. States he feels like he has more fluid.  Scheduled Meds:  amiodarone   400 mg Oral BID   Followed by   NOREEN ON 04/23/2024] amiodarone   200 mg Oral Daily   amitriptyline   25 mg Oral QHS   amLODipine   10 mg Oral QHS   apixaban   5 mg Oral BID   atorvastatin   20 mg Oral QHS   brimonidine   1 drop Both Eyes BID   And   timolol   1 drop Both Eyes BID   carvedilol   25 mg Oral BID WC   hydrALAZINE   100 mg Oral Q8H   mycophenolate   1,000 mg Oral BID   spironolactone   12.5 mg Oral Daily   tacrolimus   3 mg Oral q AM   And   tacrolimus   2 mg Oral QHS   Continuous Infusions:  PRN Meds: acetaminophen , calcium  carbonate, melatonin, ondansetron  (ZOFRAN ) IV   Vital Signs  Vitals:   04/17/24 0031 04/17/24 0415 04/17/24 0559 04/17/24 0720  BP: 116/73 (!) 124/90 (!) 132/91 127/89  Pulse: 88 74  91  Resp: 17 16    Temp: 98.8 F (37.1 C) 99.3 F (37.4 C)  98.6 F (37 C)  TempSrc: Oral Oral  Oral  SpO2: 94% 96%  98%  Weight:      Height:        Intake/Output Summary (Last 24 hours) at 04/17/2024 0821 Last data filed at 04/17/2024 0418 Gross per 24 hour  Intake 450 ml  Output --  Net 450 ml      04/15/2024    4:12 PM 08/29/2023    8:47 AM 08/29/2023    8:00 AM  Last 3 Weights  Weight (lbs) 260 lb 5.8 oz 260 lb 6.4 oz 246 lb 14.6 oz  Weight (kg) 118.1 kg 118.117 kg 112 kg      Telemetry SR in the 70s --> Afib RVR at approximately 1351 yesterday 04/16/24 - Personally Reviewed  ECG  No new tracings - Personally Reviewed  Physical Exam  GEN: No acute distress.   Neck: No JVD Cardiac: irregular rhythm, tachycardic rate Respiratory: Clear to auscultation bilaterally. GI: Soft, nontender, non-distended  MS: No edema; No  deformity. Neuro:  Nonfocal  Psych: Normal affect   Labs High Sensitivity Troponin:   Recent Labs  Lab 04/15/24 1622 04/15/24 1822  TROPONINIHS 45* 53*     Chemistry Recent Labs  Lab 04/15/24 1622 04/15/24 1629 04/16/24 0505 04/17/24 0438  NA 140 140 139 137  K 4.7 4.4 3.6 3.7  CL 107 107 102 107  CO2 19*  --  24 23  GLUCOSE 223* 236* 136* 136*  BUN 10 10 9 14   CREATININE 1.49* 1.50* 1.44* 1.70*  CALCIUM  9.3  --  9.0 9.0  GFRNONAA 52*  --  54* 44*  ANIONGAP 14  --  13 7    Lipids No results for input(s): CHOL, TRIG, HDL, LABVLDL, LDLCALC, CHOLHDL in the last 168 hours.  Hematology Recent Labs  Lab 04/15/24 1622 04/15/24 1629 04/16/24 0505 04/17/24 0438  WBC 9.1  --  9.5 7.8  RBC 5.38  --  5.18 5.07  HGB 14.5 14.6 13.9 13.6  HCT 46.2 43.0 44.4 43.3  MCV 85.9  --  85.7 85.4  MCH 27.0  --  26.8 26.8  MCHC 31.4  --  31.3 31.4  RDW 14.5  --  14.4 14.5  PLT 164  --  176 151   Thyroid No results for input(s): TSH, FREET4 in the last 168 hours.  BNP Recent Labs  Lab 04/15/24 1622  BNP 418.1*    DDimer No results for input(s): DDIMER in the last 168 hours.   Radiology  ECHO TEE Result Date: 04/16/2024    TRANSESOPHOGEAL ECHO REPORT   Patient Name:   Jon [name] Date of Exam: 04/16/2024 Medical Rec #:  [id number]   Height:       72.0 in Accession #:    [id number]  Weight:       260.4 lb Date of Birth:  09/04/1955   BSA:          2.383 m Patient Age:    67 years    BP:           114/90 mmHg Patient Gender: M           HR:           66 bpm. Exam Location:  Inpatient Procedure: Transesophageal Echo, 2D Echo, 3D Echo, Cardiac Doppler, Color            Doppler, Saline Contrast Bubble Study and Strain Analysis (Both            Spectral and Color Flow Doppler were utilized during procedure). Indications:     Atrial Fibrillation  History:         Patient has prior history of Echocardiogram examinations, most                  recent 08/16/2023. CAD, CKD,  Arrythmias:Atrial Fibrillation;                  Risk Factors:Hypertension and Dyslipidemia.  Sonographer:     Koleen Popper RDCS Referring Phys:  [id number] WADDELL LABOR PARCELLS Diagnosing Phys: Soyla Merck MD  Sonographer Comments: Global longitudinal strain was attempted. PROCEDURE: After discussion of the risks and benefits of a TEE, an informed consent was obtained from the patient. The transesophogeal probe was passed without difficulty through the esophogus of the patient. Imaged were obtained with the patient in a left lateral decubitus position. Sedation performed by different physician. The patient was monitored while under deep sedation. Anesthestetic sedation was provided intravenously by Anesthesiology: 616mg  of Propofol , 60mg  of Lidocaine . Image quality was excellent. The patient's vital signs; including heart rate, blood pressure, and oxygen saturation; remained stable throughout the procedure. Supplementary images were obtained from transthoracic windows as indicated to answer the clinical question. The patient developed no complications during the procedure. A successful direct current cardioversion was performed at 200 joules with 1 attempt.  IMPRESSIONS  1. Left ventricular ejection fraction, by estimation, is 50 to 55%. The left ventricle has low normal function. There is severe concentric left ventricular hypertrophy. The average left ventricular global longitudinal strain is -9.0 %. The global longitudinal strain is abnormal and demonstrates a pattern of relative apical sparing (obtained transthoracic).  2. Right ventricular systolic function is mildly reduced. The right ventricular size is normal. Mildly increased right ventricular wall thickness.  3. Left atrial size was severely dilated. No left atrial/left atrial appendage thrombus was detected. The LAA emptying velocity was 43 cm/s.  4. A small pericardial effusion is present.  5. The mitral valve is grossly normal. Trivial mitral valve  regurgitation. No evidence  of mitral stenosis.  6. The aortic valve is tricuspid. There is moderate calcification of the aortic valve. Aortic valve regurgitation is trivial. Aortic valve mean gradient measures 4.2 mmHg.  7. There is mild (Grade II) plaque involving the aortic arch and descending aorta.  8. Agitated saline contrast bubble study was negative, with no evidence of any interatrial shunt.  9. 3D performed of the LAA and demonstrates no LAA Thrombus. Conclusion(s)/Recommendation(s): No LA/LAA thrombus identified. Successful cardioversion performed with restoration of normal sinus rhythm. FINDINGS  Left Ventricle: Left ventricular ejection fraction, by estimation, is 50 to 55%. The left ventricle has low normal function. The average left ventricular global longitudinal strain is -9.0 %. Strain was performed and the global longitudinal strain is abnormal. The left ventricular internal cavity size was normal in size. There is severe concentric left ventricular hypertrophy. Right Ventricle: The right ventricular size is normal. Mildly increased right ventricular wall thickness. Right ventricular systolic function is mildly reduced. Left Atrium: Left atrial size was severely dilated. No left atrial/left atrial appendage thrombus was detected. The LAA emptying velocity was 43 cm/s. Right Atrium: Right atrial size was normal in size. Pericardium: A small pericardial effusion is present. Mitral Valve: The mitral valve is grossly normal. There is mild thickening of the mitral valve leaflet(s). Trivial mitral valve regurgitation. No evidence of mitral valve stenosis. Tricuspid Valve: The tricuspid valve is grossly normal. Tricuspid valve regurgitation is trivial. Aortic Valve: The aortic valve is tricuspid. There is moderate calcification of the aortic valve. Aortic valve regurgitation is trivial. Aortic valve mean gradient measures 4.2 mmHg. Aortic valve peak gradient measures 9.9 mmHg. Aortic valve area, by VTI   measures 2.09 cm. Pulmonic Valve: The pulmonic valve was grossly normal. Pulmonic valve regurgitation is trivial. Aorta: The aortic root and ascending aorta are structurally normal, with no evidence of dilitation. There is mild (Grade II) plaque involving the aortic arch and descending aorta. IAS/Shunts: No atrial level shunt detected by color flow Doppler. Agitated saline contrast was given intravenously to evaluate for intracardiac shunting. Agitated saline contrast bubble study was negative, with no evidence of any interatrial shunt. Additional Comments: 3D was performed not requiring image post processing on an independent workstation and was normal. Spectral Doppler performed. LEFT VENTRICLE PLAX 2D LVOT diam:     2.44 cm   Diastology LV SV:         50        LV e' medial:    4.35 cm/s LV SV Index:   21        LV E/e' medial:  10.1 LVOT Area:     4.66 cm  LV e' lateral:   3.37 cm/s                          LV E/e' lateral: 13.1                           2D Longitudinal Strain                          2D Strain GLS Avg:     -9.0 % AORTIC VALVE AV Area (Vmax):    2.21 cm AV Area (Vmean):   2.55 cm AV Area (VTI):     2.09 cm AV Vmax:           156.96 cm/s AV Vmean:  93.122 cm/s AV VTI:            0.241 m AV Peak Grad:      9.9 mmHg AV Mean Grad:      4.2 mmHg LVOT Vmax:         74.40 cm/s LVOT Vmean:        50.900 cm/s LVOT VTI:          0.108 m LVOT/AV VTI ratio: 0.45  AORTA Ao Root diam: 3.85 cm Ao Asc diam:  3.40 cm MITRAL VALVE MV Area (PHT): 3.03 cm    SHUNTS MV Decel Time: 250 msec    Systemic VTI:  0.11 m MV E velocity: 44.00 cm/s  Systemic Diam: 2.44 cm MV A velocity: 74.40 cm/s MV E/A ratio:  0.59 Soyla Merck MD Electronically signed by Soyla Merck MD Signature Date/Time: 04/16/2024/3:13:13 PM    Final    EP STUDY Result Date: 04/16/2024 See surgical note for result.  DG Chest 2 View Result Date: 04/15/2024 CLINICAL DATA:  Shortness of breath. EXAM: CHEST - 2 VIEW COMPARISON:   08/12/2023 FINDINGS: Chronic cardiomegaly. Vascular congestion without pulmonary edema. No focal airspace disease, large pleural effusion or pneumothorax. No acute osseous findings. IMPRESSION: Chronic cardiomegaly with vascular congestion. Electronically Signed   By: Andrea Gasman M.D.   On: 04/15/2024 18:00    Cardiac Studies  TEE-DCCV 04/16/24: FORMAL ECHOCARDIOGRAM REPORT PENDING EF 50-55% Grossly normal valves Very severe LVH, concentric Severe La dilation No LA appendage thrombus  Cardioverted 1 time(s).  Cardioversion with synchronized biphasic 200J shock.   Evaluation: Findings: Post procedure EKG shows: NSR Complications: None Patient did tolerate procedure well.   Patient Profile   68 y.o. male with ESRD status post renal transplant, HFpEF, paroxysmal atrial fibrillation, hypertension, severe LVH admitted on 04/15/2024 for acute on chronic diastolic heart failure and recurrence of atrial fibrillation.   Assessment & Plan   Afib with RVR PAF Chronic anticoagulation - TEE-DCCV yesterday with conversion to sinus rhythm, unfortunately converted back to Afib RVR yesterday afternoon 1351 (04/16/24) - remains on PO amiodarone  load and eliquis  for anticoagulation, 25 mg coreg  BID - rates are generally in the 80-110s - he feels poorly - switching 25 mg coreg  to 100 mg lopressor  for BP room - will attempt to rate control and then DCCV again in 3-4 weeks after adequate load   Hypertension - continue 100 mg hydralazine  TID, 12.5 mg spironolactone , and now 100 mg lopressor  BID - D/C'ed amlodipine  - BP well controlled when I was in the room - has received 25 mg coreg  this morning    Acute on chronic diastolic heart failure - was diuresed with  80-->40 mg IV lasix  daily, now with bump in creatinine  - LVEF 50-55% with severe concentric LVH with apical sparing, mildly reduced RV function, severe LAE - will treat with 40 mg IV lasix  BID x 2 doses today   ESRD s/p renal  transplant - continue cellcept  - sCr today 1.70, K 3.7 - baseline creatinine likely 1.4-1.5 - additional 2 doses of lasix    From [place], used to be a Psychologist, clinical.   Likely home tomorrow.    For questions or updates, please contact [facility] Please consult [email or web address] for contact info under       Signed, Jon Nat Hails, PA  04/17/2024, 8:21 AM

## 2024-04-17 NOTE — Telephone Encounter (Signed)
 Pharmacy Patient Advocate Encounter  Insurance verification completed.    The patient is insured through [employer]. Patient has Medicare and is not eligible for a copay card, but may be able to apply for patient assistance or Medicare RX Payment Plan (Patient Must reach out to their plan, if eligible for payment plan), if available.    Ran test claim for Jardiance,Farxiga and the current 30 day co-pay is $0.00.  Ran test claim for Eliquis . Refill too soon. Last filled 03/12/2024. Next refill due 05/19/2024.   This test claim was processed through [facility]- copay amounts may vary at other pharmacies due to pharmacy/plan contracts, or as the patient moves through the different stages of their insurance plan.

## 2024-04-17 NOTE — TOC Initial Note (Signed)
 Transition of Care [facility]) - Initial/Assessment Note    Patient Details  Name: Jon [name] MRN: [id number] Date of Birth: March 19, 1956  [facility]) CM/SW Contact:    Sudie Erminio Deems, RN Phone Number: 04/17/2024, 2:40 PM  Clinical Narrative:   Patient presented for shortness of breath. PTA patient was independent from home and has support of brother. Brother is at the bedside during the visit. Patient states he does not use any DME in the home.  Patient has PCP and no issues with transportation. No home needs identified from Inpatient Case Manager at this time.             Expected Discharge Plan: Home/Self Care Barriers to Discharge: No Barriers Identified   Patient Goals and CMS Choice Patient states their goals for this hospitalization and ongoing recovery are:: plans to return home once stable.   Choice offered to / list presented to : NA      Expected Discharge Plan and Services In-house Referral: NA Discharge Planning Services: CM Consult   Living arrangements for the past 2 months: Single Family Home                   DME Agency: NA       HH Arranged: NA  Prior Living Arrangements/Services Living arrangements for the past 2 months: Single Family Home Lives with:: Self (has support of brother) Patient language and need for interpreter reviewed:: Yes Do you feel safe going back to the place where you live?: Yes      Need for Family Participation in Patient Care: No (Comment) Care giver support system in place?: No (comment)   Criminal Activity/Legal Involvement Pertinent to Current Situation/Hospitalization: No - Comment as needed  Activities of Daily Living   ADL Screening (condition at time of admission) Independently performs ADLs?: Yes (appropriate for developmental age) Is the patient deaf or have difficulty hearing?: No Does the patient have difficulty seeing, even when wearing glasses/contacts?: No Does the patient have difficulty  concentrating, remembering, or making decisions?: No  Permission Sought/Granted Permission sought to share information with : Family Supports, Case Manager                Emotional Assessment Appearance:: Appears stated age Attitude/Demeanor/Rapport: Engaged Affect (typically observed): Appropriate Orientation: : Oriented to Self, Oriented to Place, Oriented to  Time, Oriented to Situation Alcohol / Substance Use: Not Applicable Psych Involvement: No (comment)  Admission diagnosis:  Paroxysmal atrial fibrillation (HCC) [I48.0] A-fib (HCC) [I48.91] Kidney transplant recipient [Z94.0] Acute on chronic congestive heart failure, unspecified heart failure type (HCC) [I50.9] Patient Active Problem List   Diagnosis Date Noted   CKD stage 3a, GFR 45-59 ml/min (HCC) 04/17/2024   Atrial fibrillation with RVR (HCC) 04/15/2024   Acute diastolic heart failure (HCC) 08/14/2023   Chest pain 08/13/2023   Hypokalemia 08/13/2023   CAD (coronary artery disease) 08/13/2023   History of shingles 01/04/2023   Acute renal failure superimposed on stage 3a chronic kidney disease (HCC) 01/03/2023   Paroxysmal atrial fibrillation (HCC) 01/03/2023   Elevated troponin 01/02/2023   Post herpetic neuralgia 11/05/2022   Acute-on-chronic kidney injury (HCC) 03/04/2021   COVID-19 virus infection 03/04/2021   Pulmonary nodule 05/01/2019   SBO (small bowel obstruction) (HCC) 04/17/2019   S/p cadaver renal transplant 08/26/2017   Red blood cell antibody positive 07/16/2017   Mixed hyperlipidemia 05/09/2017   Gastroesophageal reflux disease without esophagitis 05/09/2017   Coronary artery disease involving native coronary artery of native  heart without angina pectoris 05/09/2017   Cough 01/24/2017   Anemia due to chronic kidney disease    Thrombocytopenia (HCC)    Encounter to establish care 10/16/2016   Right ankle pain 09/07/2016   Hypertension 05/07/2012   PCP:  [facility] [address] Pharmacy:   [facility]  [facility] - DANIEL MCALP[place], [facility] [place] [address] Phone: [phone] Fax: [phone]  [id number] [place], [place] - [address] AT [address] [facility] [address] [place] [address] Phone: [phone] Fax: [phone]  [id number] - [address] ([place]), [place] - [address] [place] [address] Phone: [phone] Fax: [phone]  [facility] [address] [place] [address] Phone: [phone] Fax: [phone]     Social Drivers of Health (SDOH) Social History: SDOH Screenings   Food Insecurity: No Food Insecurity (04/16/2024)  Housing: Low Risk  (04/16/2024)  Transportation Needs: No Transportation Needs (04/16/2024)  Utilities: At Risk (04/16/2024)  Depression (PHQ2-9): Low Risk  (05/01/2019)  Social Connections: Moderately Integrated (04/16/2024)  Tobacco Use: Low Risk  (04/16/2024)   SDOH Interventions: Utilities Interventions: Community Resources Provided   Readmission Risk Interventions    01/04/2023    3:23 PM  Readmission Risk Prevention Plan  Transportation Screening Complete  PCP or Specialist Appt within 5-7 Days Complete  Home Care Screening Complete  Medication Review (RN CM) Complete

## 2024-04-18 ENCOUNTER — Other Ambulatory Visit: Payer: Self-pay | Admitting: Physician Assistant

## 2024-04-18 ENCOUNTER — Other Ambulatory Visit (HOSPITAL_COMMUNITY): Payer: Self-pay

## 2024-04-18 DIAGNOSIS — I5033 Acute on chronic diastolic (congestive) heart failure: Secondary | ICD-10-CM | POA: Diagnosis not present

## 2024-04-18 DIAGNOSIS — I480 Paroxysmal atrial fibrillation: Secondary | ICD-10-CM

## 2024-04-18 DIAGNOSIS — R9431 Abnormal electrocardiogram [ECG] [EKG]: Secondary | ICD-10-CM

## 2024-04-18 DIAGNOSIS — I517 Cardiomegaly: Secondary | ICD-10-CM

## 2024-04-18 DIAGNOSIS — N179 Acute kidney failure, unspecified: Secondary | ICD-10-CM | POA: Insufficient documentation

## 2024-04-18 LAB — BASIC METABOLIC PANEL WITH GFR
Anion gap: 11 (ref 5–15)
BUN: 22 mg/dL (ref 8–23)
CO2: 24 mmol/L (ref 22–32)
Calcium: 9.4 mg/dL (ref 8.9–10.3)
Chloride: 102 mmol/L (ref 98–111)
Creatinine, Ser: 2 mg/dL — ABNORMAL HIGH (ref 0.61–1.24)
GFR, Estimated: 36 mL/min — ABNORMAL LOW (ref >60)
Glucose, Bld: 129 mg/dL — ABNORMAL HIGH (ref 70–99)
Potassium: 3.9 mmol/L (ref 3.5–5.1)
Sodium: 137 mmol/L (ref 135–145)

## 2024-04-18 LAB — CBC
HCT: 46.4 % (ref 39.0–52.0)
Hemoglobin: 14.6 g/dL (ref 13.0–17.0)
MCH: 26.7 pg (ref 26.0–34.0)
MCHC: 31.5 g/dL (ref 30.0–36.0)
MCV: 85 fL (ref 80.0–100.0)
Platelets: 152 K/uL (ref 150–400)
RBC: 5.46 MIL/uL (ref 4.22–5.81)
RDW: 14.4 % (ref 11.5–15.5)
WBC: 8 K/uL (ref 4.0–10.5)
nRBC: 0 % (ref 0.0–0.2)

## 2024-04-18 MED ORDER — SPIRONOLACTONE 25 MG PO TABS
12.5000 mg | ORAL_TABLET | Freq: Every day | ORAL | 3 refills | Status: DC
  Filled 2024-04-18 – 2024-07-02 (×3): qty 45, 90d supply, fill #0

## 2024-04-18 MED ORDER — METOPROLOL TARTRATE 100 MG PO TABS
100.0000 mg | ORAL_TABLET | Freq: Two times a day (BID) | ORAL | 3 refills | Status: DC
  Filled 2024-04-18 – 2024-07-02 (×3): qty 180, 90d supply, fill #0

## 2024-04-18 MED ORDER — AMIODARONE HCL 200 MG PO TABS
ORAL_TABLET | ORAL | 11 refills | Status: DC
  Filled 2024-04-18: qty 50, 35d supply, fill #0

## 2024-04-18 MED ORDER — FUROSEMIDE 40 MG PO TABS
40.0000 mg | ORAL_TABLET | Freq: Every day | ORAL | 3 refills | Status: DC
  Filled 2024-04-18: qty 90, 90d supply, fill #0

## 2024-04-18 MED ORDER — APIXABAN 5 MG PO TABS
5.0000 mg | ORAL_TABLET | Freq: Two times a day (BID) | ORAL | 3 refills | Status: AC
  Filled 2024-04-18: qty 180, 90d supply, fill #0

## 2024-04-18 NOTE — Progress Notes (Signed)
 Discharge Nurse Summary: DC order noted per MD. DC RN at bedside with patient. Patient agreeable with discharge plan, states family will arrive soon for pickup.   AVS printed/reviewed. PIV removed, pressure held to site d/t bleeding risk/fistula proximity, dressing applied, no s/s bleeding, skin intact. No DME needs. No home meds. TOC meds pending pickup. CP/Edu resolved. Telemonitor not present on assessment. All belongings accounted for. Fistula with stable bruit/thrill. See LDAs. NT wheeled patient downstairs for discharge by private auto. TOC meds picked up on the way.  Rosario EMERSON Lund, RN

## 2024-04-18 NOTE — Discharge Summary (Addendum)
 Discharge Summary   Patient ID: Jon [name] MRN: [id number]; DOB: [birth year]  Admit date: 04/15/2024 Discharge date: 04/18/2024  PCP:  [facility]   [facility] Providers Cardiologist:  Darryle ONEIDA Decent, MD       Discharge Diagnoses  Principal Problem:   Acute on chronic heart failure with preserved ejection fraction (HFpEF) (HCC) Active Problems:   Persistent atrial fibrillation (HCC)   CKD stage 3a, GFR 45-59 ml/min (HCC)   AKI (acute kidney injury) (HCC)   LVH (left ventricular hypertrophy)   Hypertension   S/p cadaver renal transplant   Elevated troponin   Diagnostic Studies/Procedures  Transesophageal Echocardiogram guided Cardioversion 04/16/24 IMPRESSIONS   1. Left ventricular ejection fraction, by estimation, is 50 to 55%. The left ventricle has low normal function. There is severe concentric left ventricular hypertrophy. The average left ventricular global longitudinal strain is -9.0 %. The global longitudinal strain is abnormal and demonstrates a pattern of relative apical sparing (obtained transthoracic).  2. Right ventricular systolic function is mildly reduced. The right ventricular size is normal. Mildly increased right ventricular wall thickness.  3. Left atrial size was severely dilated. No left atrial/left atrial appendage thrombus was detected. The LAA emptying velocity was 43 cm/s.  4. A small pericardial effusion is present.  5. The mitral valve is grossly normal. Trivial mitral valve regurgitation. No evidence of mitral stenosis.  6. The aortic valve is tricuspid. There is moderate calcification of the  aortic valve. Aortic valve regurgitation is trivial. Aortic valve mean  gradient measures 4.2 mmHg.  7. There is mild (Grade II) plaque involving the aortic arch and descending aorta.  8. Agitated saline contrast bubble study was negative, with no evidence of any interatrial shunt.  9. 3D performed of the LAA and demonstrates no LAA Thrombus.    Conclusion(s)/Recommendation(s): No LA/LAA thrombus identified. Successful  cardioversion performed with restoration of normal sinus rhythm.  _____________   History of Present Illness   Jon [name] is a 68 y.o. male with a hx of (HFpEF) heart failure with preserved ejection fraction, severe LVH, nonobstructive coronary artery disease, paroxysmal atrial fibrillation, hypertension, hyperlipidemia, s/p renal Tx in 2018, chronic kidney disease, GERD who presented to the hospital on 04/15/2024 with worsening shortness of breath and elevated HRs. He was volume overloaded and was noted to be in atrial fibrillation with controlled rate. He was admitted for further evaluation and management.    Hospital Course   Consultants: None    (HFpEF) heart failure with preserved ejection fraction The patient was admitted and started on IV Furosemide  for diuresis. This was continued through 04/18/2024. TEE DCCV was performed to restore NSR. TEE showed EF was 50-55, severe concentric LVH, mildly reduced RVSF, small effusion, neg bubble study, no LAA clot. His volume status improved. His Creatinine increased to 2. IV Furosemide  was stopped. He will restart Furosemide  40 mg po once daily, Spironolactone  12.5 mg once daily on Monday 9/22. He has follow up with Hao Meng, PA-C 9/25 at 8:25 am. A repeat BMET will be obtained at that time. It was considered to start him on SGLT2i this admission. It was reviewed with pharmacy and felt to be safe with renal transplant. However, with his rise in creatinine, we decided to hold off until he is seen in follow up. SGLT2i can be considered at that time.   LVH Pt was seen by Dr. Decent who noted he will need an OP PYP scan and cardiac MRI to evaluate for amyloidosis, etc. The  patient was seen by our research team and enrolled in the REVEAL trial. PET w Evuzamitide will be compared to results of PYP and MRI. Order for PYP and MRI will be placed at DC.   Atrial fibrillation  The pt  had been taken off of Eliquis  in Jan 2025 briefly but never resumed. He was started on IV Heparin . The patient remained short of breath after 1 day of diuresis and it was felt that a lot of his symptoms are due to atrial fibrillation. He was started back on Eliquis . His Echocardiogram is concerning for possible amyloid. Therefore, rhythm control strategy will be pursued. He underwent TEE DCCV 04/16/24 with restoration of NSR. However, he had return of atrial fibrillation. He was taken off Carvedilol  and started on metoprolol  tartrate. He was started on Amiodarone  400 twice daily x 7 days, then 200 once daily. OP DCCV can be considered once he is adequately loaded with Amiodarone .   Chronic kidney disease w hx of Renal Transplant He developed AKI with diuresis with creatinine at 2.0 at discharge. As noted, his Furosemide  and Spironolactone  will be held until Monday and repeat BMET will be obtained at follow up on 9/25.   Hypertension  Blood pressure remained controlled on current therapy. Of note his Amlodipine  was stopped.   Elevated Troponin Demand Ischemia The patient's hsTrops were minimally elevated and flat and not c/w ACS. His EKG is non-ischemic and he had a low risk MPI in 07/2023. His elevated hsTrops are felt to be due to demand ischemia.   Pulmonary nodules Noted on CT in 07/2023. OP follow up can be arranged.      Did the patient have an acute coronary syndrome (MI, NSTEMI, STEMI, etc) this admission?:  No.   The elevated Troponin was due to the acute medical illness (demand ischemia).    _____________  Discharge Vitals Blood pressure 117/75, pulse 76, temperature 97.8 F (36.6 C), temperature source Oral, resp. rate 16, height 6' (1.829 m), weight 118.1 kg, SpO2 97%.  Filed Weights   04/15/24 1612  Weight: 118.1 kg    Labs & Radiologic Studies  CBC Recent Labs    04/17/24 0438 04/18/24 0428  WBC 7.8 8.0  HGB 13.6 14.6  HCT 43.3 46.4  MCV 85.4 85.0  PLT 151 152    Basic Metabolic Panel Recent Labs    90/80/74 0438 04/18/24 0428  NA 137 137  K 3.7 3.9  CL 107 102  CO2 23 24  GLUCOSE 136* 129*  BUN 14 22  CREATININE 1.70* 2.00*  CALCIUM  9.0 9.4   High Sensitivity Troponin:   Recent Labs  Lab 04/15/24 1622 04/15/24 1822  TROPONINIHS 45* 53*    BNP Recent Labs    04/15/24 1622  BNP 418.1*    _____________  DG Chest 2 View  Result Date: 04/15/2024  CLINICAL DATA:  Shortness of breath.  EXAM: CHEST - 2 VIEW COMPARISON:  08/12/2023 FINDINGS: Chronic cardiomegaly. Vascular congestion without pulmonary edema. No focal airspace disease, large pleural effusion or pneumothorax. No acute osseous findings.   IMPRESSION: Chronic cardiomegaly with vascular congestion.  Electronically Signed   By: Andrea Gasman M.D.   On: 04/15/2024 18:00    Disposition Pt is being discharged home today in good condition.  Follow-up Plans & Appointments  Follow-up Information     Janene Boer, [place] Follow up on 04/23/2024.   Specialties: Cardiology, Radiology Why: Hospital Follow Up  Appt Time: 8:25 am Please arrive 15 min early. Bring all  medications. Contact information: [address] [place] [address] [phone]                Discharge Instructions     (HEART FAILURE PATIENTS) Call MD:  Anytime you have any of the following symptoms: 1) 3 pound weight gain in 24 hours or 5 pounds in 1 week 2) shortness of breath, with or without a dry hacking cough 3) swelling in the hands, feet or stomach 4) if you have to sleep on extra pillows at night in order to breathe.   Complete by: As directed    Diet - low sodium heart healthy   Complete by: As directed    Increase activity slowly   Complete by: As directed        Discharge Medications Allergies as of 04/18/2024   No Known Allergies      Medication List     STOP taking these medications    amLODipine  10 MG tablet Commonly known as: NORVASC    carvedilol  25 MG  tablet Commonly known as: COREG        TAKE these medications    albuterol  108 (90 Base) MCG/ACT inhaler Commonly known as: VENTOLIN  HFA Inhale 2 puffs into the lungs every 6 (six) hours as needed for wheezing or shortness of breath.   amiodarone  200 MG tablet Commonly known as: PACERONE  Take 2 tablets (400 mg total) by mouth 2 (two) times daily for 5 days, THEN 1 tablet (200 mg total) daily. Start taking on: April 18, 2024   amitriptyline  25 MG tablet Commonly known as: ELAVIL  Take 25 mg by mouth at bedtime.   apixaban  5 MG Tabs tablet Commonly known as: ELIQUIS  Take 1 tablet (5 mg total) by mouth 2 (two) times daily.   atorvastatin  20 MG tablet Commonly known as: LIPITOR Take 20 mg by mouth at bedtime.   brimonidine -timolol  0.2-0.5 % ophthalmic solution Commonly known as: COMBIGAN  Place 1 drop into both eyes every 12 (twelve) hours.   furosemide  40 MG tablet Commonly known as: Lasix  Take 1 tablet (40 mg total) by mouth daily. Start taking on: April 20, 2024   hydrALAZINE  100 MG tablet Commonly known as: APRESOLINE  Take 1 tablet (100 mg total) by mouth every 8 (eight) hours.   magnesium  oxide 400 MG tablet Commonly known as: MAG-OX Take 800 mg by mouth 2 (two) times daily.   metoprolol  tartrate 100 MG tablet Commonly known as: LOPRESSOR  Take 1 tablet (100 mg total) by mouth 2 (two) times daily.   mycophenolate  200 MG/ML suspension Commonly known as: CELLCEPT  Take 1,000 mg by mouth 2 (two) times a day.   predniSONE  5 MG/5ML solution Take 5 mg by mouth daily.   Prograf  1 MG capsule Generic drug: tacrolimus  Take 2-3 mg by mouth See admin instructions. Take 3 mg by mouth in the morning and 2 mg at bedtime   spironolactone  25 MG tablet Commonly known as: ALDACTONE  Take 0.5 tablets (12.5 mg total) by mouth daily. Start taking on: April 20, 2024   sulfamethoxazole -trimethoprim  200-40 MG/5ML suspension Commonly known as: BACTRIM  Take 10 mLs by  mouth 3 (three) times a week. Take 10 ml by mouth three times a week on M-W-F.   Vyzulta  0.024 % Soln Generic drug: Latanoprostene Bunod  Place 1 drop into both eyes at bedtime.         Outstanding Labs/Studies BMET Thursday 9/25 PYP Scan Cardiac MRI  Duration of Discharge Encounter: APP Time: 46 minutes   Signed, Glendia Ferrier, PA-C 04/18/2024, 10:11 AM  I have seen and examined the patient along with Glendia Ferrier, PA-C.  I have reviewed the chart, notes and new data.  I agree with PA/NP's note.  Key new complaints: He feels much better.  Lying fully supine in bed and has no complaints of dyspnea.  Walked in the hallways without dyspnea or chest pain.  Has not had any problems with angina pectoris. Key examination changes: Jugular venous pulsations are normal, no peripheral edema, clear lungs, regular rate and rhythm with S4 present, but no S3, no audible murmurs or rubs.  Does not have any abnormalities of peripheral pulses.  Abdominal exam is benign.  Nonfocal neurological exam.  Intake output not appropriately recorded in last 24 hours. Key new findings / data: Labs show a substantial jump in creatinine to 2.0.    Maintaining sinus rhythm on telemetry.  PLAN: Afib: Successful cardioversion during this admission after starting amiodarone  (ERF after initial cardioversion without antiarrhythmics).  Reinforced the need for uninterrupted anticoagulation for the next 30 days after cardioversion.  Continue amiodarone  in recommended tapering dose. AKI: Following diuresis.  Monitor carefully as an outpatient.  Check another basic metabolic panel next week.   HFpEF: Discussed daily weight monitoring, sodium restricted/renal diet, signs and symptoms of heart failure exacerbation.  Enrolled in the reveal trial with plans for outpatient MRI and PYP scan for possible amyloidosis, although it is most likely that his echocardiographic findings are due to longstanding renal disease, not due to  amyloidosis.  Blood pressure is well-controlled.  Will hold diuretics for 48 hours after discharge.  For the same reason will not start SGLT2 inhibitors at this time. Continue SGLT2 inhibitor follow-up after renal function has returned to baseline.  Total physician time during discharge: 59 minutes  Jerel Balding, MD, FACC [facility] [phone] 04/18/2024, 1:03 PM

## 2024-04-18 NOTE — Plan of Care (Signed)

## 2024-04-18 NOTE — Plan of Care (Signed)
 Problem: Education: Goal: Knowledge of disease or condition will improve 04/18/2024 1101 by Emil Roselie SAUNDERS, RN Outcome: Adequate for Discharge 04/18/2024 1101 by Emil Roselie SAUNDERS, RN Outcome: Adequate for Discharge Goal: Understanding of medication regimen will improve 04/18/2024 1101 by Emil Roselie SAUNDERS, RN Outcome: Adequate for Discharge 04/18/2024 1101 by Emil Roselie SAUNDERS, RN Outcome: Adequate for Discharge Goal: Individualized Educational Video(s) 04/18/2024 1101 by Emil Roselie SAUNDERS, RN Outcome: Adequate for Discharge 04/18/2024 1101 by Emil Roselie SAUNDERS, RN Outcome: Adequate for Discharge   Problem: Activity: Goal: Ability to tolerate increased activity will improve 04/18/2024 1101 by Emil Roselie SAUNDERS, RN Outcome: Adequate for Discharge 04/18/2024 1101 by Emil Roselie SAUNDERS, RN Outcome: Adequate for Discharge   Problem: Cardiac: Goal: Ability to achieve and maintain adequate cardiopulmonary perfusion will improve 04/18/2024 1101 by Emil Roselie SAUNDERS, RN Outcome: Adequate for Discharge 04/18/2024 1101 by Emil Roselie SAUNDERS, RN Outcome: Adequate for Discharge   Problem: Health Behavior/Discharge Planning: Goal: Ability to safely manage health-related needs after discharge will improve 04/18/2024 1101 by Emil Roselie SAUNDERS, RN Outcome: Adequate for Discharge 04/18/2024 1101 by Emil Roselie SAUNDERS, RN Outcome: Adequate for Discharge   Problem: Education: Goal: Knowledge of General Education information will improve Description: Including pain rating scale, medication(s)/side effects and non-pharmacologic comfort measures 04/18/2024 1101 by Emil Roselie SAUNDERS, RN Outcome: Adequate for Discharge 04/18/2024 1101 by Emil Roselie SAUNDERS, RN Outcome: Adequate for Discharge   Problem: Health Behavior/Discharge Planning: Goal: Ability to manage health-related needs will improve 04/18/2024 1101 by Emil Roselie SAUNDERS, RN Outcome: Adequate for Discharge 04/18/2024 1101 by  Emil Roselie SAUNDERS, RN Outcome: Adequate for Discharge   Problem: Clinical Measurements: Goal: Ability to maintain clinical measurements within normal limits will improve 04/18/2024 1101 by Emil Roselie SAUNDERS, RN Outcome: Adequate for Discharge 04/18/2024 1101 by Emil Roselie SAUNDERS, RN Outcome: Adequate for Discharge Goal: Will remain free from infection 04/18/2024 1101 by Emil Roselie SAUNDERS, RN Outcome: Adequate for Discharge 04/18/2024 1101 by Emil Roselie SAUNDERS, RN Outcome: Adequate for Discharge Goal: Diagnostic test results will improve 04/18/2024 1101 by Emil Roselie SAUNDERS, RN Outcome: Adequate for Discharge 04/18/2024 1101 by Emil Roselie SAUNDERS, RN Outcome: Adequate for Discharge Goal: Respiratory complications will improve 04/18/2024 1101 by Emil Roselie SAUNDERS, RN Outcome: Adequate for Discharge 04/18/2024 1101 by Emil Roselie SAUNDERS, RN Outcome: Adequate for Discharge Goal: Cardiovascular complication will be avoided 04/18/2024 1101 by Emil Roselie SAUNDERS, RN Outcome: Adequate for Discharge 04/18/2024 1101 by Emil Roselie SAUNDERS, RN Outcome: Adequate for Discharge   Problem: Activity: Goal: Risk for activity intolerance will decrease 04/18/2024 1101 by Emil Roselie SAUNDERS, RN Outcome: Adequate for Discharge 04/18/2024 1101 by Emil Roselie SAUNDERS, RN Outcome: Adequate for Discharge   Problem: Nutrition: Goal: Adequate nutrition will be maintained 04/18/2024 1101 by Emil Roselie SAUNDERS, RN Outcome: Adequate for Discharge 04/18/2024 1101 by Emil Roselie SAUNDERS, RN Outcome: Adequate for Discharge   Problem: Coping: Goal: Level of anxiety will decrease 04/18/2024 1101 by Emil Roselie SAUNDERS, RN Outcome: Adequate for Discharge 04/18/2024 1101 by Emil Roselie SAUNDERS, RN Outcome: Adequate for Discharge   Problem: Elimination: Goal: Will not experience complications related to bowel motility 04/18/2024 1101 by Emil Roselie SAUNDERS, RN Outcome: Adequate for Discharge 04/18/2024 1101 by  Emil Roselie SAUNDERS, RN Outcome: Adequate for Discharge Goal: Will not experience complications related to urinary retention 04/18/2024 1101 by Emil Roselie SAUNDERS, RN Outcome: Adequate for Discharge 04/18/2024 1101 by Emil Roselie SAUNDERS, RN Outcome: Adequate for Discharge   Problem: Pain Managment: Goal: General experience  of comfort will improve and/or be controlled 04/18/2024 1101 by Emil Roselie SAUNDERS, RN Outcome: Adequate for Discharge 04/18/2024 1101 by Emil Roselie SAUNDERS, RN Outcome: Adequate for Discharge   Problem: Safety: Goal: Ability to remain free from injury will improve 04/18/2024 1101 by Emil Roselie SAUNDERS, RN Outcome: Adequate for Discharge 04/18/2024 1101 by Emil Roselie SAUNDERS, RN Outcome: Adequate for Discharge   Problem: Skin Integrity: Goal: Risk for impaired skin integrity will decrease 04/18/2024 1101 by Emil Roselie SAUNDERS, RN Outcome: Adequate for Discharge 04/18/2024 1101 by Emil Roselie SAUNDERS, RN Outcome: Adequate for Discharge

## 2024-04-22 ENCOUNTER — Other Ambulatory Visit: Payer: Self-pay | Admitting: Physician Assistant

## 2024-04-22 DIAGNOSIS — I517 Cardiomegaly: Secondary | ICD-10-CM

## 2024-04-22 DIAGNOSIS — R9431 Abnormal electrocardiogram [ECG] [EKG]: Secondary | ICD-10-CM

## 2024-04-22 DIAGNOSIS — I5033 Acute on chronic diastolic (congestive) heart failure: Secondary | ICD-10-CM

## 2024-04-22 DIAGNOSIS — I480 Paroxysmal atrial fibrillation: Secondary | ICD-10-CM

## 2024-04-23 ENCOUNTER — Ambulatory Visit: Attending: Physician Assistant | Admitting: Physician Assistant

## 2024-04-23 ENCOUNTER — Encounter: Payer: Self-pay | Admitting: Physician Assistant

## 2024-04-23 VITALS — BP 132/8 | HR 72 | Ht 72.0 in | Wt 247.7 lb

## 2024-04-23 DIAGNOSIS — I517 Cardiomegaly: Secondary | ICD-10-CM

## 2024-04-23 DIAGNOSIS — I2510 Atherosclerotic heart disease of native coronary artery without angina pectoris: Secondary | ICD-10-CM | POA: Diagnosis not present

## 2024-04-23 DIAGNOSIS — I10 Essential (primary) hypertension: Secondary | ICD-10-CM

## 2024-04-23 DIAGNOSIS — I480 Paroxysmal atrial fibrillation: Secondary | ICD-10-CM | POA: Diagnosis not present

## 2024-04-23 DIAGNOSIS — E785 Hyperlipidemia, unspecified: Secondary | ICD-10-CM

## 2024-04-23 MED ORDER — AMIODARONE HCL 200 MG PO TABS: 200.0000 mg | ORAL_TABLET | Freq: Every day | ORAL | 0 refills | Status: DC

## 2024-04-23 NOTE — Progress Notes (Unsigned)
 Cardiology Office Note   Date:  04/24/2024  ID:  Zaniel Marineau, DOB 06-May-1956, MRN [id number] PCP: [facility], [facility]  [facility] Providers Cardiologist:  Darryle ONEIDA Decent, MD     History of Present Illness Nivin Willis is a 68 y.o. male with past medical history of nonobstructive CAD on cath 02/2017, PAF on Eliquis , hypertension, hyperlipidemia, ESRD s/p renal transplant 06/2017 and now CKD stage III, GERD and shingles 2/23 with postherpetic neuropathy.  He had a history of self reported MI around 2017 while living in [place]  but did not require any intervention.  Most recent cardiac catheterization in August 2018 in anticipation of renal transplant only revealed minimal disease was minimal luminal irregularities less than 10% of the LAD, left circumflex and RCA.  He received a deceased donor kidney transplant in December 2018.  He was admitted in June 2024 for chest pain and had a new onset of atrial fibrillation.  He also had a hypertensive crisis during that admission.  Echocardiogram at the time showed EF 60 to 65% with severe LVH, grade 1 DD, normal RV function and moderate dilatation of the aortic root and ascending aorta measuring at 41 and 39 mm.  High-sensitivity troponin was mildly elevated but remained flat and felt to be due to demand ischemia in the setting of uncontrolled hypertension.  He had recurrent chest discomfort in January 2025 and underwent Myoview  that showed no ischemia or infarction but the ejection fraction was read as 38%.  Limited echocardiogram was performed that showed a EF of 50 to 55%, severe LVH and mild AI.  Lower extremity venous Doppler negative for DVT.  Patient has chronic chest pain consistent with postherpetic neuropathy on the last visit in January 2025.  He was recently admitted to the hospital on 04/15/2024 was shortness of breath with minimal exertion.  He also had orthopnea.  On arrival, it was noted the patient was in rate controlled atrial  fibrillation.  Furthermore, he has been off of DOAC for 9 months.  He was given IV Lasix  to help treat heart failure.  He underwent TEE DCCV by Dr. Loni on 04/16/2024 that showed EF 50 to 55%, grossly normal valve, severe LVH, severe LA dilatation.  He converted with a synchronized biphasic 200 J shock.  After the cardioversion, he was kept in the hospital for few more days to continue with diuresis.  He was also started on amiodarone  during the hospitalization.  Pharmacy reviewed his prior history of renal transplant and felt he remain a safe candidate for SGLT2 inhibitor.  Prior to discharge, the patient was seen by research team and enrolled in REVEAL trial. PET w Evuzamitide will be compared to results of PYP and MRI.  Unfortunately, prior to discharge he went back into A-fib, outpatient cardioversion can be considered once he is adequately loaded with amiodarone .  Patient presents today for follow-up.  He denies any chest pain but he is still very fatigued and short of breath with activity.  He is in atrial flutter based on today's EKG.  I am hesitant to add SGLT2 inhibitor since his creatinine jumped up to 2.0 prior to discharge.  I will obtain a basic metabolic panel today to reassess the renal function and potentially add SGLT2 inhibitor on the next visit.  If I do add SGLT2 inhibitor, I will also likely reduce the dose of Lasix  to 20 mg daily.  He has upcoming PYP and cardiac MRI as part of REVEAL trial.  I plan to see  the patient back in 3 weeks for reassessment.  He appears to be euvolemic on exam.  ROS:   Patient complains of fatigue or shortness of breath with activity, he has no lower extremity edema, orthopnea or PND.  He denies any chest pain  Studies Reviewed EKG Interpretation Date/Time:  Thursday April 23 2024 08:18:21 EDT Ventricular Rate:  72 PR Interval:    QRS Duration:  118 QT Interval:  432 QTC Calculation: 473 R Axis:   -66  Text Interpretation: Atrial flutter with  variable A-V block Left anterior fascicular block Minimal voltage criteria for LVH, may be normal variant ( Cornell product ) T wave abnormality, consider lateral ischemia Prolonged QT When compared with ECG of 16-Apr-2024 12:15, Atrial flutter has replaced Sinus rhythm Confirmed by Jaquanna Ballentine [phone]) on 04/24/2024 10:24:23 AM    Cardiac Studies & Procedures   ______________________________________________________________________________________________ CARDIAC CATHETERIZATION  CARDIAC CATHETERIZATION 05/05/2015  Conclusion  Prox LAD to Mid LAD lesion, 10% stenosed.  The left ventricular systolic function is normal.  Findings Coronary Findings Diagnostic  Dominance: Right  Left Anterior Descending  Intervention  No interventions have been documented.   STRESS TESTS  NM MYOCAR MULTI W/SPECT W 08/15/2023  Narrative CLINICAL DATA:  Chest pain, angina.  EXAM: MYOCARDIAL IMAGING WITH SPECT (REST AND PHARMACOLOGIC-STRESS)  GATED LEFT VENTRICULAR WALL MOTION STUDY  LEFT VENTRICULAR EJECTION FRACTION  TECHNIQUE: Standard myocardial SPECT imaging was performed after resting intravenous injection of 10 mCi Tc-21m tetrofosmin . Subsequently, intravenous infusion of Lexiscan  was performed under the supervision of the Cardiology staff. At peak effect of the drug, 30 mCi Tc-51m tetrofosmin  was injected intravenously and standard myocardial SPECT imaging was performed. Quantitative gated imaging was also performed to evaluate left ventricular wall motion, and estimate left ventricular ejection fraction.  COMPARISON:  Myocardial perfusion exam 05/03/2015  FINDINGS: Perfusion: Decreased perfusion in the mid and basilar segment inferior wall is fixed on rest and stress. No change from comparison exam. No evidence reversible ischemia.  Wall Motion: Normal left ventricular wall motion. No left ventricular dilation.  Left Ventricular Ejection Fraction: 38 %  End diastolic volume  821 ml  End systolic volume 110 ml  IMPRESSION: 1. No reversible ischemia or infarction.  Inferior wall attenuation  2.  Global hypokinesia  3. Left ventricular ejection fraction 38%  4. Non invasive risk stratification*: Intermediate (based on low ejection fraction)  *2012 Appropriate Use Criteria for Coronary Revascularization Focused Update: J Am Coll Cardiol. 2012;59(9):857-881. http://content.dementiazones.com.aspx?articleid=1201161   Electronically Signed By: Jackquline Boxer M.D. On: 08/15/2023 15:25   ECHOCARDIOGRAM  ECHOCARDIOGRAM LIMITED 08/16/2023  Narrative ECHOCARDIOGRAM LIMITED REPORT    Patient Name:   CHANC KERVIN Date of Exam: 08/16/2023 Medical Rec #:  [id number]   Height:       72.0 in Accession #:    [id number]  Weight:       249.0 lb Date of Birth:  Oct 13, 1955   BSA:          2.339 m Patient Age:    67 years    BP:           147/94 mmHg Patient Gender: M           HR:           75 bpm. Exam Location:  Inpatient  Procedure: Limited Echo, 2D Echo, Limited Color Doppler and Intracardiac Opacification Agent  Indications:    CHF  History:        Patient has prior history of Echocardiogram examinations, most  recent 05/30/2023. CAD, Arrythmias:Atrial Fibrillation, Signs/Symptoms:Chest Pain; Risk Factors:Hypertension.  Sonographer:    Lanell Maduro Referring Phys: Bellicus.Bora KENNETH C HILTY  IMPRESSIONS   1. Consider additional strain imaging or cardiac MRI given degree of LVH. Left ventricular ejection fraction, by estimation, is 50 to 55%. The left ventricle has low normal function. The left ventricular internal cavity size was mildly dilated. There is severe left ventricular hypertrophy. 2. Left atrial size was moderately dilated. 3. The mitral valve is abnormal. No evidence of mitral valve regurgitation. 4. There is mild calcification of the aortic valve. There is mild thickening of the aortic valve. Aortic valve regurgitation is mild. Aortic  valve sclerosis is present, with no evidence of aortic valve stenosis.  FINDINGS Left Ventricle: Consider additional strain imaging or cardiac MRI given degree of LVH. Left ventricular ejection fraction, by estimation, is 50 to 55%. The left ventricle has low normal function. Definity  contrast agent was given IV to delineate the left ventricular endocardial borders. The left ventricular internal cavity size was mildly dilated. There is severe left ventricular hypertrophy.  Left Atrium: Left atrial size was moderately dilated.  Pericardium: Trivial pericardial effusion is present. The pericardial effusion is posterior to the left ventricle and lateral to the left ventricle.  Mitral Valve: The mitral valve is abnormal. There is mild thickening of the mitral valve leaflet(s). There is mild calcification of the mitral valve leaflet(s).  Aortic Valve: There is mild calcification of the aortic valve. There is mild thickening of the aortic valve. Aortic valve regurgitation is mild. Aortic valve sclerosis is present, with no evidence of aortic valve stenosis.  LEFT VENTRICLE PLAX 2D LVIDd:         4.90 cm LVIDs:         3.20 cm LV PW:         2.00 cm LV IVS:        2.30 cm  LV Volumes (MOD) LV vol d, MOD A2C: 78.4 ml LV vol d, MOD A4C: 154.0 ml LV vol s, MOD A2C: 42.2 ml LV vol s, MOD A4C: 83.0 ml LV SV MOD A2C:     36.2 ml LV SV MOD A4C:     154.0 ml LV SV MOD BP:      54.9 ml  LEFT ATRIUM             Index LA diam:        4.30 cm 1.84 cm/m LA Vol (A2C):   58.1 ml 24.84 ml/m LA Vol (A4C):   44.2 ml 18.90 ml/m LA Biplane Vol: 51.7 ml 22.11 ml/m  AORTA Ao Root diam: 3.50 cm  Maude Emmer MD Electronically signed by Maude Emmer MD Signature Date/Time: 08/16/2023/12:46:29 PM    Final   TEE  ECHO TEE 04/16/2024  Narrative TRANSESOPHOGEAL ECHO REPORT    Patient Name:   HIROKI WINT Date of Exam: 04/16/2024 Medical Rec #:  [id number]   Height:       72.0 in Accession #:     [id number]  Weight:       260.4 lb Date of Birth:  06/05/1956   BSA:          2.383 m Patient Age:    67 years    BP:           114/90 mmHg Patient Gender: M           HR:           66 bpm. Exam Location:  Inpatient  Procedure: Transesophageal Echo, 2D Echo, 3D Echo, Cardiac Doppler, Color Doppler, Saline Contrast Bubble Study and Strain Analysis (Both Spectral and Color Flow Doppler were utilized during procedure).  Indications:     Atrial Fibrillation  History:         Patient has prior history of Echocardiogram examinations, most recent 08/16/2023. CAD, CKD, Arrythmias:Atrial Fibrillation; Risk Factors:Hypertension and Dyslipidemia.  Sonographer:     Koleen Popper RDCS Referring Phys:  [id number] WADDELL [employer]OR PARCELLS Diagnosing Phys: Soyla Merck MD   Sonographer Comments: Global longitudinal strain was attempted.   PROCEDURE: After discussion of the risks and benefits of a TEE, an informed consent was obtained from the patient. The transesophogeal probe was passed without difficulty through the esophogus of the patient. Imaged were obtained with the patient in a left lateral decubitus position. Sedation performed by different physician. The patient was monitored while under deep sedation. Anesthestetic sedation was provided intravenously by Anesthesiology: 616mg  of Propofol , 60mg  of Lidocaine . Image quality was excellent. The patient's vital signs; including heart rate, blood pressure, and oxygen saturation; remained stable throughout the procedure. Supplementary images were obtained from transthoracic windows as indicated to answer the clinical question. The patient developed no complications during the procedure. A successful direct current cardioversion was performed at 200 joules with 1 attempt.  IMPRESSIONS   1. Left ventricular ejection fraction, by estimation, is 50 to 55%. The left ventricle has low normal function. There is severe concentric left ventricular hypertrophy. The  average left ventricular global longitudinal strain is -9.0 %. The global longitudinal strain is abnormal and demonstrates a pattern of relative apical sparing (obtained transthoracic). 2. Right ventricular systolic function is mildly reduced. The right ventricular size is normal. Mildly increased right ventricular wall thickness. 3. Left atrial size was severely dilated. No left atrial/left atrial appendage thrombus was detected. The LAA emptying velocity was 43 cm/s. 4. A small pericardial effusion is present. 5. The mitral valve is grossly normal. Trivial mitral valve regurgitation. No evidence of mitral stenosis. 6. The aortic valve is tricuspid. There is moderate calcification of the aortic valve. Aortic valve regurgitation is trivial. Aortic valve mean gradient measures 4.2 mmHg. 7. There is mild (Grade II) plaque involving the aortic arch and descending aorta. 8. Agitated saline contrast bubble study was negative, with no evidence of any interatrial shunt. 9. 3D performed of the LAA and demonstrates no LAA Thrombus.  Conclusion(s)/Recommendation(s): No LA/LAA thrombus identified. Successful cardioversion performed with restoration of normal sinus rhythm.  FINDINGS Left Ventricle: Left ventricular ejection fraction, by estimation, is 50 to 55%. The left ventricle has low normal function. The average left ventricular global longitudinal strain is -9.0 %. Strain was performed and the global longitudinal strain is abnormal. The left ventricular internal cavity size was normal in size. There is severe concentric left ventricular hypertrophy.  Right Ventricle: The right ventricular size is normal. Mildly increased right ventricular wall thickness. Right ventricular systolic function is mildly reduced.  Left Atrium: Left atrial size was severely dilated. No left atrial/left atrial appendage thrombus was detected. The LAA emptying velocity was 43 cm/s.  Right Atrium: Right atrial size was normal  in size.  Pericardium: A small pericardial effusion is present.  Mitral Valve: The mitral valve is grossly normal. There is mild thickening of the mitral valve leaflet(s). Trivial mitral valve regurgitation. No evidence of mitral valve stenosis.  Tricuspid Valve: The tricuspid valve is grossly normal. Tricuspid valve regurgitation is trivial.  Aortic Valve: The aortic valve is tricuspid. There is moderate  calcification of the aortic valve. Aortic valve regurgitation is trivial. Aortic valve mean gradient measures 4.2 mmHg. Aortic valve peak gradient measures 9.9 mmHg. Aortic valve area, by VTI measures 2.09 cm.  Pulmonic Valve: The pulmonic valve was grossly normal. Pulmonic valve regurgitation is trivial.  Aorta: The aortic root and ascending aorta are structurally normal, with no evidence of dilitation. There is mild (Grade II) plaque involving the aortic arch and descending aorta.  IAS/Shunts: No atrial level shunt detected by color flow Doppler. Agitated saline contrast was given intravenously to evaluate for intracardiac shunting. Agitated saline contrast bubble study was negative, with no evidence of any interatrial shunt.  Additional Comments: 3D was performed not requiring image post processing on an independent workstation and was normal. Spectral Doppler performed.  LEFT VENTRICLE PLAX 2D LVOT diam:     2.44 cm   Diastology LV SV:         50        LV e' medial:    4.35 cm/s LV SV Index:   21        LV E/e' medial:  10.1 LVOT Area:     4.66 cm  LV e' lateral:   3.37 cm/s LV E/e' lateral: 13.1  2D Longitudinal Strain 2D Strain GLS Avg:     -9.0 %  AORTIC VALVE AV Area (Vmax):    2.21 cm AV Area (Vmean):   2.55 cm AV Area (VTI):     2.09 cm AV Vmax:           156.96 cm/s AV Vmean:          93.122 cm/s AV VTI:            0.241 m AV Peak Grad:      9.9 mmHg AV Mean Grad:      4.2 mmHg LVOT Vmax:         74.40 cm/s LVOT Vmean:        50.900 cm/s LVOT VTI:           0.108 m LVOT/AV VTI ratio: 0.45  AORTA Ao Root diam: 3.85 cm Ao Asc diam:  3.40 cm  MITRAL VALVE MV Area (PHT): 3.03 cm    SHUNTS MV Decel Time: 250 msec    Systemic VTI:  0.11 m MV E velocity: 44.00 cm/s  Systemic Diam: 2.44 cm MV A velocity: 74.40 cm/s MV E/A ratio:  0.59  Soyla Merck MD Electronically signed by Soyla Merck MD Signature Date/Time: 04/16/2024/3:13:13 PM    Final        ______________________________________________________________________________________________      Risk Assessment/Calculations  CHA2DS2-VASc Score = 4   This indicates a 4.8% annual risk of stroke. The patient's score is based upon: CHF History: 1 HTN History: 1 Diabetes History: 0 Stroke History: 0 Vascular Disease History: 1 Age Score: 1 Gender Score: 0            Physical Exam VS:  BP (!) 132/8   Pulse 72   Ht 6' (1.829 m)   Wt 247 lb 11.2 oz (112.4 kg)   SpO2 96%   BMI 33.59 kg/m        Wt Readings from Last 3 Encounters:  04/23/24 247 lb 11.2 oz (112.4 kg)  04/15/24 260 lb 5.8 oz (118.1 kg)  08/29/23 260 lb 6.4 oz (118.1 kg)    GEN: Well nourished, well developed in no acute distress NECK: No JVD; No carotid bruits CARDIAC: Irregularly irregular, no murmurs, rubs, gallops RESPIRATORY:  Clear to auscultation without rales, wheezing or rhonchi  ABDOMEN: Soft, non-tender, non-distended EXTREMITIES:  No edema; No deformity   ASSESSMENT AND PLAN  Atrial fibrillation: Continue amiodarone  therapy, reduce amiodarone  to 200 mg daily.  Plan to reassess in 3 weeks at which point the patient should be fully loaded on amiodarone .  Will consider cardioversion at that time.  Continue Eliquis   Severe LVH: Patient is currently enrolled in REVEAL trial and has upcoming PYP and cardiac MRI.  He recently underwent diuresis during the hospitalization, he appears to be euvolemic on exam.  If renal function stabilizes, we will consider addition of SGLT2 inhibitor on the  next visit  CAD: History of nonobstructive CAD.  He denies any chest pain  Hypertension: Blood pressure well-controlled  Hyperlipidemia: On atorvastatin         Dispo: Follow-up in 3 weeks.  Signed, Scot Ford, PA

## 2024-04-23 NOTE — Patient Instructions (Addendum)
 Medication Instructions:  TAKE AMIODARONE  200 MG DAILY. CONTINUE ALL OTHER CURRENT MEDICATION THERAPY.  Lab Work: BMET TO BE DONE TODAY.  Testing/Procedures: NONE  Follow-Up: At [facility], you and your health needs are our priority.  As part of our continuing mission to provide you with exceptional heart care, our providers are all part of one team.  This team includes your primary Cardiologist (physician) and Advanced Practice Providers or APPs (Physician Assistants and Nurse Practitioners) who all work together to provide you with the care you need, when you need it.  Your next appointment:   3 WEEKS (05-14-24 at 10:05 AM)  Provider:   HAO MENG, PA  We recommend signing up for the patient portal called MyChart.  Sign up information is provided on this After Visit Summary.  MyChart is used to connect with patients for Virtual Visits (Telemedicine).  Patients are able to view lab/test results, encounter notes, upcoming appointments, etc.  Non-urgent messages can be sent to your provider as well.   To learn more about what you can do with MyChart, go to [email or web address].

## 2024-04-24 ENCOUNTER — Ambulatory Visit (HOSPITAL_COMMUNITY)
Admission: RE | Admit: 2024-04-24 | Discharge: 2024-04-24 | Disposition: A | Discharge status: Home / Self Care | Source: Ambulatory Visit | Attending: Physician Assistant | Admitting: Physician Assistant

## 2024-04-24 ENCOUNTER — Ambulatory Visit: Payer: Self-pay | Admitting: Physician Assistant

## 2024-04-24 ENCOUNTER — Encounter

## 2024-04-24 DIAGNOSIS — I480 Paroxysmal atrial fibrillation: Secondary | ICD-10-CM | POA: Diagnosis present

## 2024-04-24 DIAGNOSIS — I5033 Acute on chronic diastolic (congestive) heart failure: Secondary | ICD-10-CM | POA: Diagnosis not present

## 2024-04-24 DIAGNOSIS — I517 Cardiomegaly: Secondary | ICD-10-CM

## 2024-04-24 DIAGNOSIS — R9431 Abnormal electrocardiogram [ECG] [EKG]: Secondary | ICD-10-CM | POA: Diagnosis not present

## 2024-04-24 LAB — BASIC METABOLIC PANEL WITH GFR
BUN/Creatinine Ratio: 9 — AB (ref 10–24)
BUN: 15 mg/dL (ref 8–27)
CO2: 18 mmol/L — ABNORMAL LOW (ref 20–29)
Calcium: 9.7 mg/dL (ref 8.6–10.2)
Chloride: 103 mmol/L (ref 96–106)
Creatinine, Ser: 1.74 mg/dL — AB (ref 0.76–1.27)
Glucose: 135 mg/dL — ABNORMAL HIGH (ref 70–99)
Potassium: 4.3 mmol/L (ref 3.5–5.2)
Sodium: 139 mmol/L (ref 134–144)
eGFR: 43 mL/min/1.73 — AB (ref >59)

## 2024-04-24 LAB — MYOCARDIAL AMYLOID PLANAR & SPECT: H/CL Ratio: 1

## 2024-04-24 MED ORDER — TECHNETIUM TC 99M PYROPHOSPHATE
21.6000 | Freq: Once | INTRAVENOUS | Status: AC
  Administered 2024-04-24: 21.6 via INTRAVENOUS

## 2024-04-25 ENCOUNTER — Ambulatory Visit: Payer: Self-pay | Admitting: Physician Assistant

## 2024-04-25 DIAGNOSIS — I517 Cardiomegaly: Secondary | ICD-10-CM

## 2024-04-27 ENCOUNTER — Other Ambulatory Visit: Payer: Self-pay

## 2024-04-27 DIAGNOSIS — I10 Essential (primary) hypertension: Secondary | ICD-10-CM

## 2024-04-27 DIAGNOSIS — I2510 Atherosclerotic heart disease of native coronary artery without angina pectoris: Secondary | ICD-10-CM

## 2024-04-27 DIAGNOSIS — E785 Hyperlipidemia, unspecified: Secondary | ICD-10-CM

## 2024-04-29 LAB — LAB REPORT - SCANNED: EGFR: 40

## 2024-04-30 ENCOUNTER — Ambulatory Visit: Payer: Self-pay | Admitting: Physician Assistant

## 2024-04-30 NOTE — Progress Notes (Signed)
 Pt has been made aware of normal result and verbalized understanding.  jw

## 2024-05-14 ENCOUNTER — Other Ambulatory Visit: Payer: Self-pay | Admitting: Physician Assistant

## 2024-05-14 ENCOUNTER — Other Ambulatory Visit (HOSPITAL_COMMUNITY): Payer: Self-pay

## 2024-05-14 ENCOUNTER — Ambulatory Visit: Attending: Physician Assistant | Admitting: Physician Assistant

## 2024-05-14 ENCOUNTER — Encounter: Payer: Self-pay | Admitting: Physician Assistant

## 2024-05-14 VITALS — BP 139/70 | HR 59

## 2024-05-14 DIAGNOSIS — B0229 Other postherpetic nervous system involvement: Secondary | ICD-10-CM

## 2024-05-14 DIAGNOSIS — E785 Hyperlipidemia, unspecified: Secondary | ICD-10-CM | POA: Diagnosis not present

## 2024-05-14 DIAGNOSIS — I10 Essential (primary) hypertension: Secondary | ICD-10-CM | POA: Diagnosis not present

## 2024-05-14 DIAGNOSIS — N186 End stage renal disease: Secondary | ICD-10-CM

## 2024-05-14 DIAGNOSIS — I2510 Atherosclerotic heart disease of native coronary artery without angina pectoris: Secondary | ICD-10-CM

## 2024-05-14 DIAGNOSIS — I4891 Unspecified atrial fibrillation: Secondary | ICD-10-CM

## 2024-05-14 MED ORDER — ATORVASTATIN CALCIUM 20 MG PO TABS: 20.0000 mg | ORAL_TABLET | Freq: Every day | ORAL | 3 refills | Status: AC

## 2024-05-14 MED ORDER — AMLODIPINE BESYLATE 10 MG PO TABS: 10.0000 mg | ORAL_TABLET | Freq: Every day | ORAL | 3 refills | Status: DC

## 2024-05-14 MED ORDER — FUROSEMIDE 20 MG PO TABS: 20.0000 mg | ORAL_TABLET | Freq: Every day | ORAL | 1 refills | Status: DC

## 2024-05-14 MED ORDER — EMPAGLIFLOZIN 10 MG PO TABS: 10.0000 mg | ORAL_TABLET | Freq: Every day | ORAL | 3 refills | Status: DC

## 2024-05-14 NOTE — Progress Notes (Signed)
 Cardiology Office Note   Date:  05/15/2024  ID:  Esley Brooking, DOB 06-09-1956, MRN [id number] PCP: [facility], [facility]  [facility] Providers Cardiologist:  Darryle ONEIDA Decent, MD     History of Present Illness Camdin Baxendale is a 68 y.o. male with past medical history of nonobstructive CAD on cath 02/2017, PAF on Eliquis , hypertension, hyperlipidemia, ESRD s/p renal transplant 06/2017 and now CKD stage III, GERD and shingles 2/23 with postherpetic neuropathy.  He had a history of self reported MI around 2017 while living in [place]  but did not require any intervention.  Most recent cardiac catheterization in August 2018 in anticipation of renal transplant only revealed minimal disease was minimal luminal irregularities less than 10% of the LAD, left circumflex and RCA.  He received a deceased donor kidney transplant in December 2018.  He was admitted in June 2024 for chest pain and had a new onset of atrial fibrillation.  He also had a hypertensive crisis during that admission.  Echocardiogram at the time showed EF 60 to 65% with severe LVH, grade 1 DD, normal RV function and moderate dilatation of the aortic root and ascending aorta measuring at 41 and 39 mm.  High-sensitivity troponin was mildly elevated but remained flat and felt to be due to demand ischemia in the setting of uncontrolled hypertension.  He had recurrent chest discomfort in January 2025 and underwent Myoview  that showed no ischemia or infarction but the ejection fraction was read as 38%.  Limited echocardiogram was performed that showed a EF of 50 to 55%, severe LVH and mild AI.  Lower extremity venous Doppler negative for DVT.  Patient has chronic chest pain consistent with postherpetic neuropathy on the last visit in January 2025.   He was recently admitted to the hospital on 04/15/2024 with shortness of breath with minimal exertion.  He also had orthopnea.  On arrival, it was noted the patient was in rate controlled atrial  fibrillation.  Furthermore, he has been off of DOAC for 9 months.  He was given IV Lasix  to help treat heart failure.  He underwent TEE DCCV by Dr. Loni on 04/16/2024 that showed EF 50 to 55%, grossly normal valve, severe LVH, severe LA dilatation.  He converted with a synchronized biphasic 200 J shock.  After the cardioversion, he was kept in the hospital for few more days to continue with diuresis.  He was also started on amiodarone  during the hospitalization.  Pharmacy reviewed his prior history of renal transplant and felt he remain a safe candidate for SGLT2 inhibitor.  Prior to discharge, the patient was seen by research team and enrolled in REVEAL trial. PET w Evuzamitide will be compared to results of PYP and MRI.  Unfortunately, prior to discharge he went back into A-fib, outpatient cardioversion can be considered once he is adequately loaded with amiodarone .  I last saw the patient on 04/23/2024, he was in atrial flutter at the time.  PYP scan obtained on 04/24/2024 was negative for cardiac amyloidosis.  He has upcoming cardiac MRI.  Patient presents today for follow-up.  He denies any chest pain or shortness of breath.  He appears to be euvolemic on exam.  As recommended by hospital pharmacist, I will start the patient on Jardiance.  With initiation of Jardiance, I decided to scale back his Lasix  to 20 mg daily.  Today's EKG shows the patient has converted to atrial fibrillation.  He will need basic metabolic panel in 1 to 2 weeks.  He can  follow-up with Dr. Barbaraann in 4 months.  ROS:   He denies chest pain, palpitations, dyspnea, pnd, orthopnea, n, v, dizziness, syncope, edema, weight gain, or early satiety. All other systems reviewed and are otherwise negative except as noted above.    Studies Reviewed EKG Interpretation Date/Time:  Thursday May 14 2024 10:01:42 EDT Ventricular Rate:  57 PR Interval:  194 QRS Duration:  116 QT Interval:  488 QTC Calculation: 474 R Axis:   -71  Text  Interpretation: Sinus bradycardia Left axis deviation Minimal voltage criteria for LVH, may be normal variant ( Cornell product ) ST & T wave abnormality, consider lateral ischemia Prolonged QT When compared with ECG of 23-Apr-2024 08:18, Sinus rhythm has replaced Atrial flutter T wave inversion more evident in Anterolateral leads Confirmed by Janene Boer [id number]) on 05/14/2024 10:17:29 AM    Cardiac Studies & Procedures   ______________________________________________________________________________________________ CARDIAC CATHETERIZATION  CARDIAC CATHETERIZATION 05/05/2015  Conclusion  Prox LAD to Mid LAD lesion, 10% stenosed.  The left ventricular systolic function is normal.  Findings Coronary Findings Diagnostic  Dominance: Right  Left Anterior Descending  Intervention  No interventions have been documented.   STRESS TESTS  NM MYOCAR MULTI W/SPECT W 08/15/2023  Narrative CLINICAL DATA:  Chest pain, angina.  EXAM: MYOCARDIAL IMAGING WITH SPECT (REST AND PHARMACOLOGIC-STRESS)  GATED LEFT VENTRICULAR WALL MOTION STUDY  LEFT VENTRICULAR EJECTION FRACTION  TECHNIQUE: Standard myocardial SPECT imaging was performed after resting intravenous injection of 10 mCi Tc-61m tetrofosmin . Subsequently, intravenous infusion of Lexiscan  was performed under the supervision of the Cardiology staff. At peak effect of the drug, 30 mCi Tc-74m tetrofosmin  was injected intravenously and standard myocardial SPECT imaging was performed. Quantitative gated imaging was also performed to evaluate left ventricular wall motion, and estimate left ventricular ejection fraction.  COMPARISON:  Myocardial perfusion exam 05/03/2015  FINDINGS: Perfusion: Decreased perfusion in the mid and basilar segment inferior wall is fixed on rest and stress. No change from comparison exam. No evidence reversible ischemia.  Wall Motion: Normal left ventricular wall motion. No left ventricular dilation.  Left  Ventricular Ejection Fraction: 38 %  End diastolic volume 178 ml  End systolic volume 110 ml  IMPRESSION: 1. No reversible ischemia or infarction.  Inferior wall attenuation  2.  Global hypokinesia  3. Left ventricular ejection fraction 38%  4. Non invasive risk stratification*: Intermediate (based on low ejection fraction)  *2012 Appropriate Use Criteria for Coronary Revascularization Focused Update: J Am Coll Cardiol. 2012;59(9):857-881. http://content.dementiazones.com.aspx?articleid=1201161   Electronically Signed By: Jackquline Boxer M.D. On: 08/15/2023 15:25   ECHOCARDIOGRAM  ECHOCARDIOGRAM LIMITED 08/16/2023  Narrative ECHOCARDIOGRAM LIMITED REPORT    Patient Name:   DONNELLE OLMEDA Date of Exam: 08/16/2023 Medical Rec #:  [id number]   Height:       72.0 in Accession #:    [id number]  Weight:       249.0 lb Date of Birth:  02/26/1956   BSA:          2.339 m Patient Age:    67 years    BP:           147/94 mmHg Patient Gender: M           HR:           75 bpm. Exam Location:  Inpatient  Procedure: Limited Echo, 2D Echo, Limited Color Doppler and Intracardiac Opacification Agent  Indications:    CHF  History:        Patient has prior history of Echocardiogram  examinations, most recent 05/30/2023. CAD, Arrythmias:Atrial Fibrillation, Signs/Symptoms:Chest Pain; Risk Factors:Hypertension.  Sonographer:    Lanell Maduro Referring Phys: Bellicus.Bora KENNETH C HILTY  IMPRESSIONS   1. Consider additional strain imaging or cardiac MRI given degree of LVH. Left ventricular ejection fraction, by estimation, is 50 to 55%. The left ventricle has low normal function. The left ventricular internal cavity size was mildly dilated. There is severe left ventricular hypertrophy. 2. Left atrial size was moderately dilated. 3. The mitral valve is abnormal. No evidence of mitral valve regurgitation. 4. There is mild calcification of the aortic valve. There is mild thickening of  the aortic valve. Aortic valve regurgitation is mild. Aortic valve sclerosis is present, with no evidence of aortic valve stenosis.  FINDINGS Left Ventricle: Consider additional strain imaging or cardiac MRI given degree of LVH. Left ventricular ejection fraction, by estimation, is 50 to 55%. The left ventricle has low normal function. Definity  contrast agent was given IV to delineate the left ventricular endocardial borders. The left ventricular internal cavity size was mildly dilated. There is severe left ventricular hypertrophy.  Left Atrium: Left atrial size was moderately dilated.  Pericardium: Trivial pericardial effusion is present. The pericardial effusion is posterior to the left ventricle and lateral to the left ventricle.  Mitral Valve: The mitral valve is abnormal. There is mild thickening of the mitral valve leaflet(s). There is mild calcification of the mitral valve leaflet(s).  Aortic Valve: There is mild calcification of the aortic valve. There is mild thickening of the aortic valve. Aortic valve regurgitation is mild. Aortic valve sclerosis is present, with no evidence of aortic valve stenosis.  LEFT VENTRICLE PLAX 2D LVIDd:         4.90 cm LVIDs:         3.20 cm LV PW:         2.00 cm LV IVS:        2.30 cm  LV Volumes (MOD) LV vol d, MOD A2C: 78.4 ml LV vol d, MOD A4C: 154.0 ml LV vol s, MOD A2C: 42.2 ml LV vol s, MOD A4C: 83.0 ml LV SV MOD A2C:     36.2 ml LV SV MOD A4C:     154.0 ml LV SV MOD BP:      54.9 ml  LEFT ATRIUM             Index LA diam:        4.30 cm 1.84 cm/m LA Vol (A2C):   58.1 ml 24.84 ml/m LA Vol (A4C):   44.2 ml 18.90 ml/m LA Biplane Vol: 51.7 ml 22.11 ml/m  AORTA Ao Root diam: 3.50 cm  Maude Emmer MD Electronically signed by Maude Emmer MD Signature Date/Time: 08/16/2023/12:46:29 PM    Final   TEE  ECHO TEE 04/16/2024  Narrative TRANSESOPHOGEAL ECHO REPORT    Patient Name:   JEANETTA FINDER Date of Exam:  04/16/2024 Medical Rec #:  [id number]   Height:       72.0 in Accession #:    [id number]  Weight:       260.4 lb Date of Birth:  [birth year]   BSA:          2.383 m Patient Age:    67 years    BP:           114/90 mmHg Patient Gender: M           HR:           66 bpm. Exam Location:  Inpatient  Procedure: Transesophageal Echo, 2D Echo, 3D Echo, Cardiac Doppler, Color Doppler, Saline Contrast Bubble Study and Strain Analysis (Both Spectral and Color Flow Doppler were utilized during procedure).  Indications:     Atrial Fibrillation  History:         Patient has prior history of Echocardiogram examinations, most recent 08/16/2023. CAD, CKD, Arrythmias:Atrial Fibrillation; Risk Factors:Hypertension and Dyslipidemia.  Sonographer:     Koleen Popper RDCS Referring Phys:  [id number] WADDELL [employer] PARCELLS Diagnosing Phys: Soyla Merck MD   Sonographer Comments: Global longitudinal strain was attempted.   PROCEDURE: After discussion of the risks and benefits of a TEE, an informed consent was obtained from the patient. The transesophogeal probe was passed without difficulty through the esophogus of the patient. Imaged were obtained with the patient in a left lateral decubitus position. Sedation performed by different physician. The patient was monitored while under deep sedation. Anesthestetic sedation was provided intravenously by Anesthesiology: 616mg  of Propofol , 60mg  of Lidocaine . Image quality was excellent. The patient's vital signs; including heart rate, blood pressure, and oxygen saturation; remained stable throughout the procedure. Supplementary images were obtained from transthoracic windows as indicated to answer the clinical question. The patient developed no complications during the procedure. A successful direct current cardioversion was performed at 200 joules with 1 attempt.  IMPRESSIONS   1. Left ventricular ejection fraction, by estimation, is 50 to 55%. The left ventricle has low  normal function. There is severe concentric left ventricular hypertrophy. The average left ventricular global longitudinal strain is -9.0 %. The global longitudinal strain is abnormal and demonstrates a pattern of relative apical sparing (obtained transthoracic). 2. Right ventricular systolic function is mildly reduced. The right ventricular size is normal. Mildly increased right ventricular wall thickness. 3. Left atrial size was severely dilated. No left atrial/left atrial appendage thrombus was detected. The LAA emptying velocity was 43 cm/s. 4. A small pericardial effusion is present. 5. The mitral valve is grossly normal. Trivial mitral valve regurgitation. No evidence of mitral stenosis. 6. The aortic valve is tricuspid. There is moderate calcification of the aortic valve. Aortic valve regurgitation is trivial. Aortic valve mean gradient measures 4.2 mmHg. 7. There is mild (Grade II) plaque involving the aortic arch and descending aorta. 8. Agitated saline contrast bubble study was negative, with no evidence of any interatrial shunt. 9. 3D performed of the LAA and demonstrates no LAA Thrombus.  Conclusion(s)/Recommendation(s): No LA/LAA thrombus identified. Successful cardioversion performed with restoration of normal sinus rhythm.  FINDINGS Left Ventricle: Left ventricular ejection fraction, by estimation, is 50 to 55%. The left ventricle has low normal function. The average left ventricular global longitudinal strain is -9.0 %. Strain was performed and the global longitudinal strain is abnormal. The left ventricular internal cavity size was normal in size. There is severe concentric left ventricular hypertrophy.  Right Ventricle: The right ventricular size is normal. Mildly increased right ventricular wall thickness. Right ventricular systolic function is mildly reduced.  Left Atrium: Left atrial size was severely dilated. No left atrial/left atrial appendage thrombus was detected. The LAA  emptying velocity was 43 cm/s.  Right Atrium: Right atrial size was normal in size.  Pericardium: A small pericardial effusion is present.  Mitral Valve: The mitral valve is grossly normal. There is mild thickening of the mitral valve leaflet(s). Trivial mitral valve regurgitation. No evidence of mitral valve stenosis.  Tricuspid Valve: The tricuspid valve is grossly normal. Tricuspid valve regurgitation is trivial.  Aortic Valve: The aortic valve is tricuspid. There  is moderate calcification of the aortic valve. Aortic valve regurgitation is trivial. Aortic valve mean gradient measures 4.2 mmHg. Aortic valve peak gradient measures 9.9 mmHg. Aortic valve area, by VTI measures 2.09 cm.  Pulmonic Valve: The pulmonic valve was grossly normal. Pulmonic valve regurgitation is trivial.  Aorta: The aortic root and ascending aorta are structurally normal, with no evidence of dilitation. There is mild (Grade II) plaque involving the aortic arch and descending aorta.  IAS/Shunts: No atrial level shunt detected by color flow Doppler. Agitated saline contrast was given intravenously to evaluate for intracardiac shunting. Agitated saline contrast bubble study was negative, with no evidence of any interatrial shunt.  Additional Comments: 3D was performed not requiring image post processing on an independent workstation and was normal. Spectral Doppler performed.  LEFT VENTRICLE PLAX 2D LVOT diam:     2.44 cm   Diastology LV SV:         50        LV e' medial:    4.35 cm/s LV SV Index:   21        LV E/e' medial:  10.1 LVOT Area:     4.66 cm  LV e' lateral:   3.37 cm/s LV E/e' lateral: 13.1  2D Longitudinal Strain 2D Strain GLS Avg:     -9.0 %  AORTIC VALVE AV Area (Vmax):    2.21 cm AV Area (Vmean):   2.55 cm AV Area (VTI):     2.09 cm AV Vmax:           156.96 cm/s AV Vmean:          93.122 cm/s AV VTI:            0.241 m AV Peak Grad:      9.9 mmHg AV Mean Grad:      4.2 mmHg LVOT  Vmax:         74.40 cm/s LVOT Vmean:        50.900 cm/s LVOT VTI:          0.108 m LVOT/AV VTI ratio: 0.45  AORTA Ao Root diam: 3.85 cm Ao Asc diam:  3.40 cm  MITRAL VALVE MV Area (PHT): 3.03 cm    SHUNTS MV Decel Time: 250 msec    Systemic VTI:  0.11 m MV E velocity: 44.00 cm/s  Systemic Diam: 2.44 cm MV A velocity: 74.40 cm/s MV E/A ratio:  0.59  Soyla Merck MD Electronically signed by Soyla Merck MD Signature Date/Time: 04/16/2024/3:13:13 PM    Final       PYP SCAN  MYOCARDIAL AMYLOID PLANAR AND SPECT 04/24/2024  Interpretation Summary   Findings are not suggestive of cardiac ATTR amyloidosis. The myocardium was negative for radiotracer uptake.   The visual grade of myocardial uptake relative to the ribs was Grade 0 (No myocardial uptake and normal bone uptake).   CT images were obtained for attenuation correction and were examined for the presence of coronary calcium  when appropriate.   Coronary calcium  was present on the attenuation correction CT images. Moderate coronary calcifications were present. Coronary calcifications were present in the left anterior descending artery and right coronary artery distribution(s).   There is significant radiotracer uptake in blood pool, but does not appear myocardial uptake  ______________________________________________________________________________________________      Risk Assessment/Calculations  CHA2DS2-VASc Score = 4   This indicates a 4.8% annual risk of stroke. The patient's score is based upon: CHF History: 1 HTN History: 1 Diabetes History: 0 Stroke History:  0 Vascular Disease History: 1 Age Score: 1 Gender Score: 0            Physical Exam VS:  BP 139/70   Pulse (!) 59        Wt Readings from Last 3 Encounters:  04/23/24 247 lb 11.2 oz (112.4 kg)  04/15/24 260 lb 5.8 oz (118.1 kg)  08/29/23 260 lb 6.4 oz (118.1 kg)    GEN: Well nourished, well developed in no acute distress NECK: No JVD;  No carotid bruits CARDIAC: RRR, no murmurs, rubs, gallops RESPIRATORY:  Clear to auscultation without rales, wheezing or rhonchi  ABDOMEN: Soft, non-tender, non-distended EXTREMITIES:  No edema; No deformity   ASSESSMENT AND PLAN  Atrial fibrillation: Continue amiodarone  therapy.  EKG shows patient has self converted back to sinus rhythm.  Severe LVH: Patient has been enrolled in REVEAL trial.  PYP scan was negative.  Upcoming cardiac MRI tomorrow.  I added Jardiance to his medical regimen today, will reduce the dose of Lasix  to 20 mg daily.  Obtain basic metabolic panel in 1 to 2 weeks  CAD: History of nonobstructive CAD.  Denies any chest pain  Hypertension: Blood pressure well-controlled  Hyperlipidemia: On atorvastatin .       Dispo: Follow-up with Dr. Barbaraann in 4 months  Signed, Jaycion Treml, [place]

## 2024-05-14 NOTE — Patient Instructions (Signed)
 Thank you for choosing [facility]!     Medication Instructions:  START Jardiance 10mg . Take one tablet daily.  DECREASE Furosemide  to 20mg . Take one tablet daily.  *If you need a refill on your cardiac medications before your next appointment, please call your pharmacy*   Lab Work: Return in 1-2 weeks for labs ............ BMET If you have labs (blood work) drawn today and your tests are completely normal, you will receive your results only by: MyChart Message (if you have MyChart) OR A paper copy in the mail If you have any lab test that is abnormal or we need to change your treatment, we will call you to review the results.   Testing/Procedures: No procedures were ordered during today's visit.   Your next appointment:   4 month(s)   Provider:   Darryle ONEIDA Decent, MD     Follow-Up: At [facility], you and your health needs are our priority.  As part of our continuing mission to provide you with exceptional heart care, we have created designated Provider Care Teams.  These Care Teams include your primary Cardiologist (physician) and Advanced Practice Providers (APPs -  Physician Assistants and Nurse Practitioners) who all work together to provide you with the care you need, when you need it. We recommend signing up for the patient portal called MyChart.  Sign up information is provided on this After Visit Summary.  MyChart is used to connect with patients for Virtual Visits (Telemedicine).  Patients are able to view lab/test results, encounter notes, upcoming appointments, etc.  Non-urgent messages can be sent to your provider as well.   To learn more about what you can do with MyChart, go to [email or web address].

## 2024-05-15 ENCOUNTER — Ambulatory Visit (HOSPITAL_COMMUNITY): Admission: RE | Admit: 2024-05-15 | Source: Ambulatory Visit

## 2024-05-15 ENCOUNTER — Emergency Department (HOSPITAL_COMMUNITY)
Admission: EM | Admit: 2024-05-15 | Discharge: 2024-05-15 | Disposition: A | Discharge status: Home / Self Care | Attending: Emergency Medicine | Admitting: Emergency Medicine

## 2024-05-15 ENCOUNTER — Other Ambulatory Visit: Payer: Self-pay

## 2024-05-15 DIAGNOSIS — Z09 Encounter for follow-up examination after completed treatment for conditions other than malignant neoplasm: Secondary | ICD-10-CM

## 2024-05-15 NOTE — Discharge Instructions (Signed)
 Please reach out to your cardiology office to clarify your next available appointment.

## 2024-05-15 NOTE — ED Provider Notes (Cosign Needed)
 [place] EMERGENCY DEPARTMENT AT [facility] Provider Note   CSN: [id number] Arrival date & time: 05/15/24  9341     Patient presents with: Follow-up   Jon [name] is a 68 y.o. male.   The history is provided by the patient and medical records. No language interpreter was used.     68 year old male history of CAD, paroxysmal A-fib on Eliquis , end-stage renal disease status post renal transplant, hypertension, hyperlipidemia, GERD presenting to the ER for an appointment.  Patient states he had an appointment scheduled for today but he does not know where to go.  He was walking up the second floor of [facility] and got lost and the staff instructed him to come to the ER.  He is unsure who he is supposed to follow-up but his appointment was through heart care.  He was seen by his cardiologist yesterday for follow-up appointment.  He is without any other complaint.  Prior to Admission medications   Medication Sig Start Date End Date Taking? Authorizing Provider  albuterol  (PROVENTIL  HFA;VENTOLIN  HFA) 108 (90 Base) MCG/ACT inhaler Inhale 2 puffs into the lungs every 6 (six) hours as needed for wheezing or shortness of breath. 01/24/17   Alfornia Madison, MD  amiodarone  (PACERONE ) 200 MG tablet Take 1 tablet (200 mg total) by mouth daily. 04/23/24 04/23/25  Meng, Hao, PA  amitriptyline  (ELAVIL ) 25 MG tablet Take 25 mg by mouth at bedtime. 12/13/23   [provider]  amLODipine  (NORVASC ) 10 MG tablet Take 1 tablet (10 mg total) by mouth daily. 05/14/24   Meng, Hao, PA  apixaban  (ELIQUIS ) 5 MG TABS tablet Take 1 tablet (5 mg total) by mouth 2 (two) times daily. 04/18/24   Lelon Hamilton T, PA-C  atorvastatin  (LIPITOR) 20 MG tablet Take 1 tablet (20 mg total) by mouth at bedtime. 05/14/24   Meng, Hao, PA  brimonidine -timolol  (COMBIGAN ) 0.2-0.5 % ophthalmic solution Place 1 drop into both eyes every 12 (twelve) hours.    [provider]  empagliflozin (JARDIANCE)  10 MG TABS tablet Take 1 tablet (10 mg total) by mouth daily before breakfast. 05/14/24   Meng, Hao, PA  furosemide  (LASIX ) 20 MG tablet Take 1 tablet (20 mg total) by mouth daily. 05/14/24 08/12/24  Meng, Hao, PA  hydrALAZINE  (APRESOLINE ) 100 MG tablet Take 1 tablet (100 mg total) by mouth every 8 (eight) hours. 01/06/23   Patsy Lenis, MD  magnesium  oxide (MAG-OX) 400 MG tablet Take 800 mg by mouth 2 (two) times daily. 01/28/24   [provider]  metoprolol  tartrate (LOPRESSOR ) 100 MG tablet Take 1 tablet (100 mg total) by mouth 2 (two) times daily. 04/18/24   Lelon Hamilton T, PA-C  mycophenolate  (CELLCEPT ) 200 MG/ML suspension Take 1,000 mg by mouth 2 (two) times a day. 03/20/18   [provider]  predniSONE  5 MG/5ML solution Take 5 mg by mouth daily. 11/18/18   [provider]  spironolactone  (ALDACTONE ) 25 MG tablet Take 0.5 tablet (12.5 mg total) by mouth daily. 04/20/24   Lelon Hamilton T, PA-C  tacrolimus  (PROGRAF ) 1 MG capsule Take 2-3 mg by mouth See admin instructions. Take 3 mg by mouth in the morning and 2 mg at bedtime    [provider]  VYZULTA  0.024 % SOLN Place 1 drop into both eyes at bedtime.    [provider]  fluticasone  (FLOVENT  HFA) 44 MCG/ACT inhaler Inhale 1 puff into the lungs 2 (two) times daily. Patient not taking: No sig reported 03/08/17  03/04/21  Sood, Vineet, MD    Allergies: Patient has no known allergies.    Review of Systems  Constitutional:  Negative for fever.  Respiratory:  Negative for chest tightness.     Updated Vital Signs BP (!) 174/105 (BP Location: Right Arm)   Pulse (!) 58   Temp 97.8 F (36.6 C) (Oral)   Resp 17   SpO2 99%   Physical Exam Constitutional:      General: He is not in acute distress.    Appearance: He is well-developed.     Comments: Patient resting comfortably in no acute discomfort.  HENT:     Head: Atraumatic.  Eyes:     Conjunctiva/sclera: Conjunctivae normal.  Musculoskeletal:      Cervical back: Normal range of motion and neck supple.  Skin:    Findings: No rash.  Neurological:     Mental Status: He is alert.     (all labs ordered are listed, but only abnormal results are displayed) Labs Reviewed - No data to display  EKG: None  Radiology: No results found.   Procedures   Medications Ordered in the ED - No data to display                                  Medical Decision Making  Patient showed me his phone that has a cardiology appointment with 2 appointment dates.  1 was from yesterday and another 1 was today with 2 different time slots.  EMR review patient was seen at his cardiac appointment yesterday.  I do not think patient will need to return to his cardiology appointment today for the same appointment.  I encouraged patient to reach out to his provider for further clarification but otherwise patient is stable for discharge.     Final diagnoses:  Encounter for follow-up    ED Discharge Orders     None          Nivia Colon, PA-C 05/15/24 9244

## 2024-05-15 NOTE — ED Triage Notes (Signed)
 Pt. Stated, Im here to follow up with a Dr., I forgot his name. I have no complaints.

## 2024-05-18 ENCOUNTER — Other Ambulatory Visit (HOSPITAL_COMMUNITY): Payer: Self-pay

## 2024-05-18 MED ORDER — AMIODARONE HCL 200 MG PO TABS
200.0000 mg | ORAL_TABLET | Freq: Every day | ORAL | 3 refills | Status: AC
  Filled 2024-05-18: qty 90, 90d supply, fill #0

## 2024-05-19 ENCOUNTER — Other Ambulatory Visit (HOSPITAL_COMMUNITY): Payer: Self-pay

## 2024-05-27 ENCOUNTER — Other Ambulatory Visit (HOSPITAL_COMMUNITY): Payer: Self-pay

## 2024-06-29 ENCOUNTER — Other Ambulatory Visit (HOSPITAL_COMMUNITY): Payer: Self-pay

## 2024-07-02 ENCOUNTER — Other Ambulatory Visit: Payer: Self-pay

## 2024-07-08 ENCOUNTER — Other Ambulatory Visit: Payer: Self-pay

## 2024-07-28 ENCOUNTER — Telehealth: Payer: Self-pay | Admitting: Physician Assistant

## 2024-07-28 NOTE — Telephone Encounter (Signed)
"  ° °  Pre-operative Risk Assessment    Patient Name: Jon [name]  DOB: 1956/02/23 MRN: [id number]   Date of last office visit: 05/14/2024 Date of next office visit: TBD   Request for Surgical Clearance    Procedure:  Rezum  Date of Surgery:  Clearance 08/25/23                                Surgeon:  Dr. Carolee Surgeon's Group or Practice Name:  [facility]  Phone number:  [phone] [id number] Fax number:  [phone]   Type of Clearance Requested:   - Medical  - Pharmacy:  Hold Aspirin  and Apixaban  (Eliquis ) Aspirin  for 5 days prior and Eliquis  2 days prior    Type of Anesthesia:  Pronox   Additional requests/questions:    Bonney [name] [name] [name]   07/28/2024, 4:19 PM   "

## 2024-07-29 NOTE — Telephone Encounter (Signed)
" ° °  Name: Jon [name]  DOB: [birth year]  MRN: [id number]  Primary Cardiologist: Darryle ONEIDA Decent, MD  Chart reviewed as part of pre-operative protocol coverage. Because of Jon [name]'s past medical history and time since last visit, he will require a follow-up in-office visit in order to better assess preoperative cardiovascular risk. Has hx of Afib, but converted to NSR on last appt. Would need EKG.  Has appt on 09/14/2024 with Dr. Decent but will need to be seen sooner (last seen by Scot Ford, PA 05/14/2024) as procedure is on 08/24/2024 unless can be delayed until appointment with Dr. Decent.  Pre-op covering staff: - Please schedule appointment and call patient to inform them. If patient already had an upcoming appointment within acceptable timeframe, please add pre-op clearance to the appointment notes so provider is aware. - Please contact requesting surgeon's office via preferred method (i.e, phone, fax) to inform them of need for appointment prior to surgery.  This message will also be routed to pharmacy pool for input on holding Eliquis  as requested below so that this information is available to the clearing provider at time of patient's appointment.   Lamarr Satterfield, NP  07/29/2024, 8:00 AM   "

## 2024-07-29 NOTE — Telephone Encounter (Signed)
 Pharmacy please advise on holding Eliquis  for 2 days prior to Rezum procedure for BPH, scheduled for 08/24/2024. Thank you.

## 2024-07-29 NOTE — Telephone Encounter (Signed)
 Patient with diagnosis of A fib on Eliquis  for anticoagulation.    Procedure: Rezum Date of procedure: 08/25/23   CHA2DS2-VASc Score = 4  This indicates a 4.8% annual risk of stroke. The patient's score is based upon: CHF History: 1 HTN History: 1 Diabetes History: 0 Stroke History: 0 Vascular Disease History: 1 Age Score: 1 Gender Score: 0     CrCl 66 ml/min Platelet count 152k  Patient has not had an Afib/aflutter ablation in the last 3 months, DCCV within the last 4 weeks or a watchman implanted in the last 45 days    Per office protocol, patient can hold Eliquis  for 2 days prior to procedure.    **This guidance is not considered finalized until pre-operative APP has relayed final recommendations.**

## 2024-07-29 NOTE — Telephone Encounter (Signed)
 1st attempt to reach pt regarding surgical clearance and the need for an IN OFFICE appointment.  Left pt a detailed message to call back and get that scheduled.

## 2024-07-31 NOTE — Telephone Encounter (Signed)
 2nd attempt to reach pt to schedule a sooner appt in office as pt now is needing preop clearance as well.

## 2024-08-03 NOTE — Telephone Encounter (Addendum)
 Will send FYI to requesting to see notes from preop APP Lamarr Satterfield, DNP.    We have tried x 2 to reach pt to schedule sooner app for preop clearance. Procedure is planned for 08/24/24, though pt has appt with his cardiologist 09/14/24.

## 2024-08-03 NOTE — Telephone Encounter (Signed)
 3rd attempt today to move appt up sooner ; or is the pt going to wait until 08/2024 appt with Dr.O'Neal before he is cleared. Pt can let operator/scheduling team know when he calls back.

## 2024-08-07 ENCOUNTER — Other Ambulatory Visit: Payer: Self-pay

## 2024-08-07 ENCOUNTER — Inpatient Hospital Stay (HOSPITAL_COMMUNITY)
Admission: EM | Admit: 2024-08-07 | Discharge: 2024-08-12 | DRG: 368 | Disposition: A | Discharge status: Home / Self Care | Attending: Internal Medicine | Admitting: Internal Medicine

## 2024-08-07 DIAGNOSIS — D631 Anemia in chronic kidney disease: Secondary | ICD-10-CM | POA: Diagnosis present

## 2024-08-07 DIAGNOSIS — K921 Melena: Secondary | ICD-10-CM

## 2024-08-07 DIAGNOSIS — R578 Other shock: Secondary | ICD-10-CM | POA: Diagnosis not present

## 2024-08-07 DIAGNOSIS — Z79899 Other long term (current) drug therapy: Secondary | ICD-10-CM | POA: Diagnosis not present

## 2024-08-07 DIAGNOSIS — E1122 Type 2 diabetes mellitus with diabetic chronic kidney disease: Secondary | ICD-10-CM | POA: Diagnosis present

## 2024-08-07 DIAGNOSIS — K920 Hematemesis: Secondary | ICD-10-CM | POA: Diagnosis not present

## 2024-08-07 DIAGNOSIS — K219 Gastro-esophageal reflux disease without esophagitis: Secondary | ICD-10-CM | POA: Diagnosis present

## 2024-08-07 DIAGNOSIS — I779 Disorder of arteries and arterioles, unspecified: Secondary | ICD-10-CM | POA: Diagnosis not present

## 2024-08-07 DIAGNOSIS — I4891 Unspecified atrial fibrillation: Secondary | ICD-10-CM | POA: Diagnosis not present

## 2024-08-07 DIAGNOSIS — I252 Old myocardial infarction: Secondary | ICD-10-CM

## 2024-08-07 DIAGNOSIS — I2510 Atherosclerotic heart disease of native coronary artery without angina pectoris: Secondary | ICD-10-CM | POA: Diagnosis present

## 2024-08-07 DIAGNOSIS — Z79624 Long term (current) use of inhibitors of nucleotide synthesis: Secondary | ICD-10-CM

## 2024-08-07 DIAGNOSIS — I129 Hypertensive chronic kidney disease with stage 1 through stage 4 chronic kidney disease, or unspecified chronic kidney disease: Secondary | ICD-10-CM | POA: Diagnosis present

## 2024-08-07 DIAGNOSIS — Z7951 Long term (current) use of inhaled steroids: Secondary | ICD-10-CM

## 2024-08-07 DIAGNOSIS — E875 Hyperkalemia: Secondary | ICD-10-CM | POA: Diagnosis present

## 2024-08-07 DIAGNOSIS — Z8711 Personal history of peptic ulcer disease: Secondary | ICD-10-CM

## 2024-08-07 DIAGNOSIS — R579 Shock, unspecified: Secondary | ICD-10-CM | POA: Diagnosis not present

## 2024-08-07 DIAGNOSIS — R1013 Epigastric pain: Secondary | ICD-10-CM | POA: Diagnosis not present

## 2024-08-07 DIAGNOSIS — Z8249 Family history of ischemic heart disease and other diseases of the circulatory system: Secondary | ICD-10-CM

## 2024-08-07 DIAGNOSIS — I480 Paroxysmal atrial fibrillation: Secondary | ICD-10-CM | POA: Diagnosis present

## 2024-08-07 DIAGNOSIS — N186 End stage renal disease: Secondary | ICD-10-CM | POA: Diagnosis not present

## 2024-08-07 DIAGNOSIS — I428 Other cardiomyopathies: Secondary | ICD-10-CM | POA: Diagnosis present

## 2024-08-07 DIAGNOSIS — K226 Gastro-esophageal laceration-hemorrhage syndrome: Principal | ICD-10-CM | POA: Diagnosis present

## 2024-08-07 DIAGNOSIS — I110 Hypertensive heart disease with heart failure: Secondary | ICD-10-CM | POA: Diagnosis not present

## 2024-08-07 DIAGNOSIS — Z7984 Long term (current) use of oral hypoglycemic drugs: Secondary | ICD-10-CM

## 2024-08-07 DIAGNOSIS — N1831 Chronic kidney disease, stage 3a: Secondary | ICD-10-CM | POA: Diagnosis present

## 2024-08-07 DIAGNOSIS — I63441 Cerebral infarction due to embolism of right cerebellar artery: Secondary | ICD-10-CM | POA: Diagnosis not present

## 2024-08-07 DIAGNOSIS — D62 Acute posthemorrhagic anemia: Secondary | ICD-10-CM | POA: Diagnosis present

## 2024-08-07 DIAGNOSIS — Z940 Kidney transplant status: Secondary | ICD-10-CM

## 2024-08-07 DIAGNOSIS — G8929 Other chronic pain: Secondary | ICD-10-CM

## 2024-08-07 DIAGNOSIS — I120 Hypertensive chronic kidney disease with stage 5 chronic kidney disease or end stage renal disease: Secondary | ICD-10-CM | POA: Diagnosis not present

## 2024-08-07 DIAGNOSIS — K6389 Other specified diseases of intestine: Secondary | ICD-10-CM | POA: Diagnosis not present

## 2024-08-07 DIAGNOSIS — K449 Diaphragmatic hernia without obstruction or gangrene: Secondary | ICD-10-CM | POA: Diagnosis present

## 2024-08-07 DIAGNOSIS — R29703 NIHSS score 3: Secondary | ICD-10-CM | POA: Diagnosis not present

## 2024-08-07 DIAGNOSIS — I10 Essential (primary) hypertension: Secondary | ICD-10-CM | POA: Diagnosis present

## 2024-08-07 DIAGNOSIS — E669 Obesity, unspecified: Secondary | ICD-10-CM | POA: Diagnosis not present

## 2024-08-07 DIAGNOSIS — K922 Gastrointestinal hemorrhage, unspecified: Secondary | ICD-10-CM | POA: Diagnosis present

## 2024-08-07 DIAGNOSIS — I639 Cerebral infarction, unspecified: Secondary | ICD-10-CM | POA: Diagnosis not present

## 2024-08-07 DIAGNOSIS — Z7901 Long term (current) use of anticoagulants: Secondary | ICD-10-CM

## 2024-08-07 DIAGNOSIS — I5033 Acute on chronic diastolic (congestive) heart failure: Secondary | ICD-10-CM | POA: Diagnosis not present

## 2024-08-07 DIAGNOSIS — Z6833 Body mass index (BMI) 33.0-33.9, adult: Secondary | ICD-10-CM | POA: Diagnosis not present

## 2024-08-07 DIAGNOSIS — Q283 Other malformations of cerebral vessels: Secondary | ICD-10-CM | POA: Diagnosis not present

## 2024-08-07 DIAGNOSIS — E785 Hyperlipidemia, unspecified: Secondary | ICD-10-CM | POA: Diagnosis present

## 2024-08-07 DIAGNOSIS — D649 Anemia, unspecified: Secondary | ICD-10-CM | POA: Diagnosis not present

## 2024-08-07 LAB — COMPREHENSIVE METABOLIC PANEL WITH GFR
ALT: 6 U/L (ref 0–44)
AST: 17 U/L (ref 15–41)
Albumin: 3.6 g/dL (ref 3.5–5.0)
Alkaline Phosphatase: 52 U/L (ref 38–126)
Anion gap: 10 (ref 5–15)
BUN: 42 mg/dL — ABNORMAL HIGH (ref 8–23)
CO2: 20 mmol/L — ABNORMAL LOW (ref 22–32)
Calcium: 8.8 mg/dL — ABNORMAL LOW (ref 8.9–10.3)
Chloride: 106 mmol/L (ref 98–111)
Creatinine, Ser: 1.71 mg/dL — ABNORMAL HIGH (ref 0.61–1.24)
GFR, Estimated: 44 mL/min — ABNORMAL LOW
Glucose, Bld: 185 mg/dL — ABNORMAL HIGH (ref 70–99)
Potassium: 5 mmol/L (ref 3.5–5.1)
Sodium: 136 mmol/L (ref 135–145)
Total Bilirubin: 0.6 mg/dL (ref 0.0–1.2)
Total Protein: 5.7 g/dL — ABNORMAL LOW (ref 6.5–8.1)

## 2024-08-07 LAB — PROTIME-INR
INR: 1.4 — ABNORMAL HIGH (ref 0.8–1.2)
Prothrombin Time: 18 s — ABNORMAL HIGH (ref 11.4–15.2)

## 2024-08-07 LAB — I-STAT CHEM 8, ED
BUN: 42 mg/dL — ABNORMAL HIGH (ref 8–23)
Calcium, Ion: 1.18 mmol/L (ref 1.15–1.40)
Chloride: 106 mmol/L (ref 98–111)
Creatinine, Ser: 1.9 mg/dL — ABNORMAL HIGH (ref 0.61–1.24)
Glucose, Bld: 192 mg/dL — ABNORMAL HIGH (ref 70–99)
HCT: 41 % (ref 39.0–52.0)
Hemoglobin: 13.9 g/dL (ref 13.0–17.0)
Potassium: 5 mmol/L (ref 3.5–5.1)
Sodium: 138 mmol/L (ref 135–145)
TCO2: 22 mmol/L (ref 22–32)

## 2024-08-07 LAB — TROPONIN T, HIGH SENSITIVITY
Troponin T High Sensitivity: 36 ng/L — ABNORMAL HIGH (ref 0–19)
Troponin T High Sensitivity: 39 ng/L — ABNORMAL HIGH (ref 0–19)

## 2024-08-07 LAB — CBC WITH DIFFERENTIAL/PLATELET
Abs Immature Granulocytes: 0.06 K/uL (ref 0.00–0.07)
Basophils Absolute: 0 K/uL (ref 0.0–0.1)
Basophils Relative: 0 %
Eosinophils Absolute: 0.1 K/uL (ref 0.0–0.5)
Eosinophils Relative: 0 %
HCT: 38.3 % — ABNORMAL LOW (ref 39.0–52.0)
Hemoglobin: 11.8 g/dL — ABNORMAL LOW (ref 13.0–17.0)
Immature Granulocytes: 1 %
Lymphocytes Relative: 11 %
Lymphs Abs: 1.4 K/uL (ref 0.7–4.0)
MCH: 27.9 pg (ref 26.0–34.0)
MCHC: 30.8 g/dL (ref 30.0–36.0)
MCV: 90.5 fL (ref 80.0–100.0)
Monocytes Absolute: 1.2 K/uL — ABNORMAL HIGH (ref 0.1–1.0)
Monocytes Relative: 10 %
Neutro Abs: 10 K/uL — ABNORMAL HIGH (ref 1.7–7.7)
Neutrophils Relative %: 78 %
Platelets: 121 K/uL — ABNORMAL LOW (ref 150–400)
RBC: 4.23 MIL/uL (ref 4.22–5.81)
RDW: 15 % (ref 11.5–15.5)
WBC: 12.8 K/uL — ABNORMAL HIGH (ref 4.0–10.5)
nRBC: 0 % (ref 0.0–0.2)

## 2024-08-07 LAB — HEMOGLOBIN AND HEMATOCRIT, BLOOD
HCT: 39.6 % (ref 39.0–52.0)
Hemoglobin: 12.3 g/dL — ABNORMAL LOW (ref 13.0–17.0)

## 2024-08-07 LAB — APTT: aPTT: 35 s (ref 24–36)

## 2024-08-07 LAB — I-STAT CG4 LACTIC ACID, ED: Lactic Acid, Venous: 0.7 mmol/L (ref 0.5–1.9)

## 2024-08-07 LAB — CBC
HCT: 36.7 % — ABNORMAL LOW (ref 39.0–52.0)
Hemoglobin: 11.4 g/dL — ABNORMAL LOW (ref 13.0–17.0)
MCH: 27.8 pg (ref 26.0–34.0)
MCHC: 31.1 g/dL (ref 30.0–36.0)
MCV: 89.5 fL (ref 80.0–100.0)
Platelets: 124 K/uL — ABNORMAL LOW (ref 150–400)
RBC: 4.1 MIL/uL — ABNORMAL LOW (ref 4.22–5.81)
RDW: 14.9 % (ref 11.5–15.5)
WBC: 11.2 K/uL — ABNORMAL HIGH (ref 4.0–10.5)
nRBC: 0 % (ref 0.0–0.2)

## 2024-08-07 LAB — ETHANOL: Alcohol, Ethyl (B): <15 mg/dL

## 2024-08-07 LAB — MRSA NEXT GEN BY PCR, NASAL: MRSA by PCR Next Gen: NOT DETECTED

## 2024-08-07 MED ORDER — SODIUM CHLORIDE 0.9 % IV SOLN: INTRAVENOUS | Status: DC

## 2024-08-07 MED ORDER — METOPROLOL TARTRATE 5 MG/5ML IV SOLN
5.0000 mg | Freq: Four times a day (QID) | INTRAVENOUS | Status: DC | PRN
  Administered 2024-08-12: 5 mg via INTRAVENOUS
  Filled 2024-08-07: qty 5

## 2024-08-07 MED ORDER — CHLORHEXIDINE GLUCONATE CLOTH 2 % EX PADS
6.0000 | MEDICATED_PAD | Freq: Every day | CUTANEOUS | Status: DC
  Administered 2024-08-07 – 2024-08-08 (×2): 6 via TOPICAL

## 2024-08-07 MED ORDER — ONDANSETRON HCL 4 MG PO TABS: 4.0000 mg | ORAL_TABLET | Freq: Four times a day (QID) | ORAL | Status: DC | PRN

## 2024-08-07 MED ORDER — LACTATED RINGERS IV BOLUS
1000.0000 mL | Freq: Once | INTRAVENOUS | Status: AC
  Administered 2024-08-07: 1000 mL via INTRAVENOUS

## 2024-08-07 MED ORDER — ONDANSETRON HCL 4 MG/2ML IJ SOLN
4.0000 mg | Freq: Four times a day (QID) | INTRAMUSCULAR | Status: DC | PRN
  Administered 2024-08-08: 4 mg via INTRAVENOUS
  Filled 2024-08-07: qty 2

## 2024-08-07 MED ORDER — PANTOPRAZOLE SODIUM 40 MG IV SOLR
40.0000 mg | Freq: Two times a day (BID) | INTRAVENOUS | Status: DC
  Administered 2024-08-07 – 2024-08-12 (×10): 40 mg via INTRAVENOUS
  Filled 2024-08-07 (×10): qty 10

## 2024-08-07 MED ORDER — SODIUM CHLORIDE 0.9 % IV SOLN: INTRAVENOUS | Status: AC

## 2024-08-07 MED ORDER — ACETAMINOPHEN 650 MG RE SUPP: 650.0000 mg | Freq: Four times a day (QID) | RECTAL | Status: DC | PRN

## 2024-08-07 MED ORDER — ACETAMINOPHEN 325 MG PO TABS
650.0000 mg | ORAL_TABLET | Freq: Four times a day (QID) | ORAL | Status: DC | PRN
  Administered 2024-08-08 – 2024-08-10 (×2): 650 mg via ORAL
  Filled 2024-08-07 (×2): qty 2

## 2024-08-07 MED ORDER — PROTHROMBIN COMPLEX CONC HUMAN 500 UNITS IV KIT
5455.0000 [IU] | PACK | Status: AC
  Administered 2024-08-07: 5455 [IU] via INTRAVENOUS
  Filled 2024-08-07: qty 4000

## 2024-08-07 MED ORDER — PANTOPRAZOLE SODIUM 40 MG IV SOLR
80.0000 mg | Freq: Once | INTRAVENOUS | Status: AC
  Administered 2024-08-07: 80 mg via INTRAVENOUS
  Filled 2024-08-07: qty 20

## 2024-08-07 NOTE — ED Triage Notes (Signed)
 Pt c/o vomiting and abd pain x this morning. While pt in triage he was noted to be vomiting a dark red substance, pt is on a blood thinner and unsure the name.

## 2024-08-07 NOTE — Assessment & Plan Note (Addendum)
 Last took eliquis  at 9;00AM Given kcentra  in ED to reverse eliquis   Has active upper GI bleed Continue on monitor  Rate controlled in NSR  Start back rate control drugs when no longer NPO IV metoprolol  PRN

## 2024-08-07 NOTE — Assessment & Plan Note (Signed)
 Baseline normal, 13-14 Presenting with hgb of 11.8>12.3  Type and screen  Transfuse if hgb <8  Trend cbc q 6 hours

## 2024-08-07 NOTE — Assessment & Plan Note (Signed)
 Bolus portonix followed by 40mg  BID for UGIB

## 2024-08-07 NOTE — Assessment & Plan Note (Signed)
 69 year old presenting to ED with one day history of diarrhea and hematemesis found to have an upper GI bleed -admit to stepdown -eliquis  reversed with Kcentra   -GI consulted -PPI bolus followed by 40mg  IV BID -gentle IVF at 40cc/hour x 6 hours  -NPO -has had 2 more copious, dark hematemesis episodes in ED  -check stool PCR with diarrhea  -trend cbc q6 -type and screen with Ab in blood, so may take time to get blood  -zofran  PRN  -does not use any NSAIDs, drink alcohol. Denies any prior history of GI bleeds/ulcers  -updated GI

## 2024-08-07 NOTE — Assessment & Plan Note (Signed)
 Continue  medical management when no longer NPO

## 2024-08-07 NOTE — Assessment & Plan Note (Signed)
 Continue prograf  and cellcept 

## 2024-08-07 NOTE — Assessment & Plan Note (Signed)
 Soft in setting of GI bleed, hold anti HTN  IV metoprolol  with parameters if needed

## 2024-08-07 NOTE — ED Provider Notes (Signed)
 " [place] EMERGENCY DEPARTMENT AT [facility] Provider Note   CSN: [id number] Arrival date & time: 08/07/24  1656     Patient presents with: Abdominal Pain and Vomiting (/)   Jon [name] is a 69 y.o. male.   Patient is a 69 year old male with a history of hypertension, chronic kidney disease status post renal transplant, atrial fibrillation on Eliquis , CHF who presents with upper GI bleeding.  He said he started having some pain in his epigastric area earlier this morning and had an episode of vomiting this morning which was blood.  He has had about 3 or 4 more episodes since he has been here in the emergency room.  He feels lightheaded and feels like he might pass out.  He denies any history of GI bleeds in the past.  He said he has had some prior gastric ulcers but has been several years.  He denies any history of liver disease.  No alcohol use.       Prior to Admission medications  Medication Sig Start Date End Date Taking? Authorizing Provider  albuterol  (PROVENTIL  HFA;VENTOLIN  HFA) 108 (90 Base) MCG/ACT inhaler Inhale 2 puffs into the lungs every 6 (six) hours as needed for wheezing or shortness of breath. 01/24/17   Alfornia Madison, MD  amiodarone  (PACERONE ) 200 MG tablet Take 1 tablet (200 mg total) by mouth daily. 05/18/24   Meng, Hao, PA  amitriptyline  (ELAVIL ) 25 MG tablet Take 25 mg by mouth at bedtime. 12/13/23   [provider]  amLODipine  (NORVASC ) 10 MG tablet Take 1 tablet (10 mg total) by mouth daily. 05/14/24   Meng, Hao, PA  apixaban  (ELIQUIS ) 5 MG TABS tablet Take 1 tablet (5 mg total) by mouth 2 (two) times daily. 04/18/24   Lelon Hamilton T, PA-C  atorvastatin  (LIPITOR) 20 MG tablet Take 1 tablet (20 mg total) by mouth at bedtime. 05/14/24   Meng, Hao, PA  brimonidine -timolol  (COMBIGAN ) 0.2-0.5 % ophthalmic solution Place 1 drop into both eyes every 12 (twelve) hours.    [provider]  empagliflozin  (JARDIANCE ) 10 MG TABS tablet Take 1  tablet (10 mg total) by mouth daily before breakfast. 05/14/24   Meng, Hao, PA  furosemide  (LASIX ) 20 MG tablet Take 1 tablet (20 mg total) by mouth daily. 05/14/24 08/12/24  Meng, Hao, PA  hydrALAZINE  (APRESOLINE ) 100 MG tablet Take 1 tablet (100 mg total) by mouth every 8 (eight) hours. 01/06/23   Patsy Lenis, MD  magnesium  oxide (MAG-OX) 400 MG tablet Take 800 mg by mouth 2 (two) times daily. 01/28/24   [provider]  metoprolol  tartrate (LOPRESSOR ) 100 MG tablet Take 1 tablet (100 mg total) by mouth 2 (two) times daily. 04/18/24   Lelon Hamilton T, PA-C  mycophenolate  (CELLCEPT ) 200 MG/ML suspension Take 1,000 mg by mouth 2 (two) times a day. 03/20/18   [provider]  predniSONE  5 MG/5ML solution Take 5 mg by mouth daily. 11/18/18   [provider]  spironolactone  (ALDACTONE ) 25 MG tablet Take 0.5 tablet (12.5 mg total) by mouth daily. 04/20/24   Lelon Hamilton T, PA-C  tacrolimus  (PROGRAF ) 1 MG capsule Take 2-3 mg by mouth See admin instructions. Take 3 mg by mouth in the morning and 2 mg at bedtime    [provider]  VYZULTA  0.024 % SOLN Place 1 drop into both eyes at bedtime.    [provider]  fluticasone  (FLOVENT  HFA) 44 MCG/ACT inhaler Inhale 1 puff into the lungs 2 (two) times  daily. Patient not taking: No sig reported 03/08/17 03/04/21  Sood, Vineet, MD    Allergies: Patient has no known allergies.    Review of Systems  Constitutional:  Positive for fatigue. Negative for chills, diaphoresis and fever.  HENT:  Negative for congestion, rhinorrhea and sneezing.   Eyes: Negative.   Respiratory:  Negative for cough, chest tightness and shortness of breath.   Cardiovascular:  Negative for chest pain and leg swelling.  Gastrointestinal:  Positive for abdominal pain, nausea and vomiting. Negative for diarrhea.  Genitourinary:  Negative for difficulty urinating, flank pain and frequency.  Musculoskeletal:  Negative for arthralgias and back pain.   Skin:  Negative for rash.  Neurological:  Positive for light-headedness. Negative for speech difficulty, weakness, numbness and headaches.    Updated Vital Signs BP 111/74   Pulse 66   Temp 98.8 F (37.1 C) (Oral)   Resp (!) 23   Wt 112 kg   SpO2 97%   BMI 33.49 kg/m   Physical Exam Constitutional:      Appearance: He is well-developed. He is ill-appearing and diaphoretic.  HENT:     Head: Normocephalic and atraumatic.  Eyes:     Pupils: Pupils are equal, round, and reactive to light.  Cardiovascular:     Rate and Rhythm: Normal rate and regular rhythm.     Heart sounds: Normal heart sounds.  Pulmonary:     Effort: Pulmonary effort is normal. No respiratory distress.     Breath sounds: Normal breath sounds. No wheezing or rales.  Chest:     Chest wall: No tenderness.  Abdominal:     General: Bowel sounds are normal.     Palpations: Abdomen is soft.     Tenderness: There is abdominal tenderness in the epigastric area. There is no guarding or rebound.  Musculoskeletal:        General: Normal range of motion.     Cervical back: Normal range of motion and neck supple.  Lymphadenopathy:     Cervical: No cervical adenopathy.  Skin:    General: Skin is warm.     Findings: No rash.  Neurological:     Mental Status: He is alert and oriented to person, place, and time.     (all labs ordered are listed, but only abnormal results are displayed) Labs Reviewed  CBC WITH DIFFERENTIAL/PLATELET - Abnormal; Notable for the following components:      Result Value   WBC 12.8 (*)    Hemoglobin 11.8 (*)    HCT 38.3 (*)    Platelets 121 (*)    Neutro Abs 10.0 (*)    Monocytes Absolute 1.2 (*)    All other components within normal limits  COMPREHENSIVE METABOLIC PANEL WITH GFR - Abnormal; Notable for the following components:   CO2 20 (*)    Glucose, Bld 185 (*)    BUN 42 (*)    Creatinine, Ser 1.71 (*)    Calcium  8.8 (*)    Total Protein 5.7 (*)    GFR, Estimated 44 (*)     All other components within normal limits  PROTIME-INR - Abnormal; Notable for the following components:   Prothrombin  Time 18.0 (*)    INR 1.4 (*)    All other components within normal limits  HEMOGLOBIN AND HEMATOCRIT, BLOOD - Abnormal; Notable for the following components:   Hemoglobin 12.3 (*)    All other components within normal limits  I-STAT CHEM 8, ED - Abnormal; Notable for the following components:  BUN 42 (*)    Creatinine, Ser 1.90 (*)    Glucose, Bld 192 (*)    All other components within normal limits  GASTROINTESTINAL PANEL BY PCR, STOOL (REPLACES STOOL CULTURE)  MRSA NEXT GEN BY PCR, NASAL  APTT  ETHANOL  CBC  CBC  BASIC METABOLIC PANEL WITH GFR  I-STAT CG4 LACTIC ACID, ED  I-STAT CG4 LACTIC ACID, ED  TYPE AND SCREEN  TROPONIN T, HIGH SENSITIVITY    EKG: None  Radiology: No results found.   Procedures   Medications Ordered in the ED  pantoprazole  (PROTONIX ) injection 40 mg (has no administration in time range)  acetaminophen  (TYLENOL ) tablet 650 mg (has no administration in time range)    Or  acetaminophen  (TYLENOL ) suppository 650 mg (has no administration in time range)  ondansetron  (ZOFRAN ) tablet 4 mg (has no administration in time range)    Or  ondansetron  (ZOFRAN ) injection 4 mg (has no administration in time range)  metoprolol  tartrate (LOPRESSOR ) injection 5 mg (has no administration in time range)  Chlorhexidine  Gluconate Cloth 2 % PADS 6 each (has no administration in time range)  0.9 %  sodium chloride  infusion (has no administration in time range)  lactated ringers  bolus 1,000 mL (0 mLs Intravenous Stopped 08/07/24 1844)  pantoprazole  (PROTONIX ) injection 80 mg (80 mg Intravenous Given 08/07/24 1753)  prothrombin  complex conc human (KCENTRA ) IVPB 5,455 Units (5,455 Units Intravenous New Bag/Given 08/07/24 1938)                                    Medical Decision Making Amount and/or Complexity of Data Reviewed Labs:  ordered.  Risk Prescription drug management. Decision regarding hospitalization.   This patient presents to the ED for concern of hematemesis, this involves an extensive number of treatment options, and is a complaint that carries with it a high risk of complications and morbidity.  I considered the following differential and admission for this acute, potentially life threatening condition.  The differential diagnosis includes peptic ulcer disease, gastritis, variceal bleeding, Mallory-Weiss tear, aneurysm  MDM:    Patient is a 69 year old male who presents with upper GI bleeding and epigastric pain.  It started earlier today.  He is on Eliquis .  He was ill-appearing on arrival, diaphoretic with borderline low blood pressures in the low 100s systolic.  He was started on some IV fluids although he was just bolus 500 cc initially given his history of CHF.  He was started on IV Protonix .  His Eliquis  was reversed with Kcentra .  Peers that he is on the Eliquis  for atrial fibrillation.  His hemoglobin is 11.8 currently.  He is looking a little bit better.  He had about 3 episodes of frank hematemesis in my presence.  Discussed with Dr. Abran with [facility] gastroenterology.  He agrees with current treatment.  Advises to keep the patient n.p.o. after midnight.  Will see the patient in the morning.  Discussed with Dr. Waddell who will admit the patient for further treatment.  CRITICAL CARE Performed by: Andrea Ness Total critical care time: 70 minutes Critical care time was exclusive of separately billable procedures and treating other patients. Critical care was necessary to treat or prevent imminent or life-threatening deterioration. Critical care was time spent personally by me on the following activities: development of treatment plan with patient and/or surrogate as well as nursing, discussions with consultants, evaluation of patient's response to treatment, examination of  patient, obtaining history  from patient or surrogate, ordering and performing treatments and interventions, ordering and review of laboratory studies, ordering and review of radiographic studies, pulse oximetry and re-evaluation of patient's condition.   (Labs, imaging, consults)  Labs: I Ordered, and personally interpreted labs.  The pertinent results include: Hemoglobin 11.8, creatinine 1.71 but similar to prior values.  Imaging Studies ordered: I ordered imaging studies including   I independently visualized and interpreted imaging. I agree with the radiologist interpretation  Additional history obtained from  .  External records from outside source obtained and reviewed including prior notes  Cardiac Monitoring: The patient was maintained on a cardiac monitor.  If on the cardiac monitor, I personally viewed and interpreted the cardiac monitored which showed an underlying rhythm of: Sinus rhythm  Reevaluation: After the interventions noted above, I reevaluated the patient and found that they have :improved  Social Determinants of Health:    Disposition: Admit to hospital  Co morbidities that complicate the patient evaluation  Past Medical History:  Diagnosis Date   Chronic kidney disease    Dialysis patient    Dyspnea    GERD (gastroesophageal reflux disease)    History of blood transfusion    Hyperlipidemia    takes Atorvastatin  daily   Hypertension    takes Amlodipine ,Losartan ,and Labetalol  daily   Myocardial infarction (HCC)    lived in [place]  maybe 7 years ago   Small bowel obstruction (HCC)      Medicines Meds ordered this encounter  Medications   lactated ringers  bolus 1,000 mL   pantoprazole  (PROTONIX ) injection 80 mg   prothrombin  complex conc human (KCENTRA ) IVPB 5,455 Units    Administer at a rate of 3 units/kg/minute up to a maximum rate of 8.4 mL/min (~210 units/minute). Consider changing administration time to weight-based on a case-by-case basis (ie life-threatening bleed or  lack of IV access sites).   pantoprazole  (PROTONIX ) injection 40 mg   OR Linked Order Group    acetaminophen  (TYLENOL ) tablet 650 mg    acetaminophen  (TYLENOL ) suppository 650 mg   OR Linked Order Group    ondansetron  (ZOFRAN ) tablet 4 mg    ondansetron  (ZOFRAN ) injection 4 mg   metoprolol  tartrate (LOPRESSOR ) injection 5 mg   Chlorhexidine  Gluconate Cloth 2 % PADS 6 each   DISCONTD: 0.9 %  sodium chloride  infusion   DISCONTD: 0.9 %  sodium chloride  infusion   0.9 %  sodium chloride  infusion    I have reviewed the patients home medicines and have made adjustments as needed  Problem List / ED Course: Problem List Items Addressed This Visit       Digestive   * (Principal) Upper GI bleed - Primary   69 year old presenting to ED with one day history of diarrhea and hematemesis found to have an upper GI bleed -admit to stepdown -eliquis  reversed with Kcentra   -GI consulted -PPI bolus followed by 40mg  IV BID -gentle IVF at 40cc/hour x 6 hours  -NPO -has had 2 more copious, dark hematemesis episodes in ED  -check stool PCR with diarrhea  -trend cbc q6 -type and screen with Ab in blood, so may take time to get blood  -zofran  PRN  -does not use any NSAIDs, drink alcohol. Denies any prior history of GI bleeds/ulcers  -updated GI                 Final diagnoses:  Upper GI bleed    ED Discharge Orders     None  Lenor Hollering, MD 08/07/24 2025  "

## 2024-08-07 NOTE — H&P (Signed)
 " History and Physical    Patient: Jon [name] FMW:[id number] DOB: 1956/05/11 DOA: 08/07/2024 DOS: the patient was seen and examined on 08/07/2024 PCP: [facility] [address]  Patient coming from: Home - lives alone    Chief Complaint: hematemesis   HPI: Jon [name] is a 69 y.o. male with medical history significant of NICM with severe LVH, PAF on eliquis , HTN, ESRD secondary to HTN s/p renal transplant in 2018 who presented to ED with hemetemesis. He had one episdoe earlier today and then had a few episodes out in the waiting room and the 3 episodes in the ED. He states he also had diarrhea that started today and a headache. He denies every having a GI bleed before. He does not use NSAIDs. He does not drink alcohol. Denies any fever or chills. He denies any blood in his stool or having dark/tarry stool. He has some epigastric pain and chest pain. Denies any palpitations. He last took eliquis  at 9:00 this AM.    Denies any fever/chills, vision changes, palpitations, shortness of breath or cough,  dysuria or leg swelling.    He does not smoke or drink alcohol.   ER Course:  vitals: afebrile, bp: 95/63, HR: 68, RR: 18, oxygen: 99%RA Pertinent labs: wbc: 12.8, hgb: 11.8, BUN: 42, creatinine: 1.71, INR: 1.4,  In ED: GI consulted. Given Kcentra , 1L IVF bolus, protonix  80mg . [facility] asked to admit.    Review of Systems: As mentioned in the history of present illness. All other systems reviewed and are negative. Past Medical History:  Diagnosis Date   Chronic kidney disease    Dialysis patient    Dyspnea    GERD (gastroesophageal reflux disease)    History of blood transfusion    Hyperlipidemia    takes Atorvastatin  daily   Hypertension    takes Amlodipine ,Losartan ,and Labetalol  daily   Myocardial infarction [facility])    lived in [place]  maybe 7 years ago   Small bowel obstruction [facility])    Past Surgical History:  Procedure Laterality Date   ARTERIOVENOUS GRAFT PLACEMENT     AV FISTULA PLACEMENT  Right 10/25/2015   Procedure: ARTERIOVENOUS (AV) FISTULA CREATION RIGHT FOREARM;  Surgeon: Lonni GORMAN Blade, MD;  Location: [facility] OR;  Service: Vascular;  Laterality: Right;   BOTOX  INJECTION N/A 08/07/2021   Procedure: cystoscopy BOTOX  INJECTION;  Surgeon: Carolee Sherwood [name] DOUGLAS, MD;  Location: [facility] ORS;  Service: Urology;  Laterality: N/A;   CARDIAC CATHETERIZATION N/A 05/05/2015   Procedure: Left Heart Cath and Coronary Angiography;  Surgeon: Rober Chroman, MD;  Location: [facility] INVASIVE CV LAB;  Service: Cardiovascular;  Laterality: N/A;   CARDIOVERSION N/A 04/16/2024   Procedure: CARDIOVERSION;  Surgeon: Loni Soyla LABOR, MD;  Location: [facility] INVASIVE CV LAB;  Service: Cardiovascular;  Laterality: N/A;   LIGATION OF ARTERIOVENOUS  FISTULA Left 01/06/2019   Procedure: EXCISION OF ANEURYSMAL  ARTERIOVENOUS  FISTULA LEFT ARM;  Surgeon: Blade Lonni GORMAN, MD;  Location: [facility] OR;  Service: Vascular;  Laterality: Left;   PERIPHERAL VASCULAR CATHETERIZATION N/A 08/18/2015   Procedure: Fistulagram;  Surgeon: Redell LITTIE Door, MD;  Location: [facility] INVASIVE CV LAB;  Service: Cardiovascular;  Laterality: N/A;   PERIPHERAL VASCULAR CATHETERIZATION Right 04/19/2016   Procedure: Fistulagram;  Surgeon: Redell LITTIE Door, MD;  Location: [facility] INVASIVE CV LAB;  Service: Cardiovascular;  Laterality: Right;   RESECTION OF ARTERIOVENOUS FISTULA ANEURYSM Left 02/05/2013   Procedure: REPAIR OF ANEURYSM OF LEFT ARM ARTERIOVENOUS FISTULA ;  Surgeon: Gaile LELON New, MD;  Location: [facility]  OR;  Service: Vascular;  Laterality: Left;   RESECTION OF ARTERIOVENOUS FISTULA ANEURYSM Left 03/08/2015   Procedure: REPAIR OF LEFT ARTERIOVENOUS FISTULA PSEUDOANEURYSM;  Surgeon: Lonni GORMAN Blade, MD;  Location: [facility] OR;  Service: Vascular;  Laterality: Left;   REVISON OF ARTERIOVENOUS FISTULA Left 06/02/2015   Procedure: PLICATION OF A LARGE ANEURYSM LEFT UPPER ARM BRACHIO-CEPHALIC ARTERIOVENOUS FISTULA ;  Surgeon: Lonni GORMAN Blade, MD;  Location: [facility] OR;   Service: Vascular;  Laterality: Left;   SMALL INTESTINE SURGERY     TRANSESOPHAGEAL ECHOCARDIOGRAM (CATH LAB) N/A 04/16/2024   Procedure: TRANSESOPHAGEAL ECHOCARDIOGRAM;  Surgeon: Loni Soyla LABOR, MD;  Location: [facility] INVASIVE CV LAB;  Service: Cardiovascular;  Laterality: N/A;   Social History:  reports that he has never smoked. He has never used smokeless tobacco. He reports that he does not drink alcohol and does not use drugs.  Allergies[1]  Family History  Problem Relation Age of Onset   Hypertension Mother    Other Mother        varicose veins   Aneurysm Mother    Diabetes Father    Hypertension Father    Heart attack Father    Hypertension Sister    Other Sister        varicose veins   Other Brother        varicose veins    Prior to Admission medications  Medication Sig Start Date End Date Taking? Authorizing Provider  albuterol  (PROVENTIL  HFA;VENTOLIN  HFA) 108 (90 Base) MCG/ACT inhaler Inhale 2 puffs into the lungs every 6 (six) hours as needed for wheezing or shortness of breath. 01/24/17   Alfornia Madison, MD  amiodarone  (PACERONE ) 200 MG tablet Take 1 tablet (200 mg total) by mouth daily. 05/18/24   Meng, Hao, PA  amitriptyline  (ELAVIL ) 25 MG tablet Take 25 mg by mouth at bedtime. 12/13/23   [provider]  amLODipine  (NORVASC ) 10 MG tablet Take 1 tablet (10 mg total) by mouth daily. 05/14/24   Meng, Hao, PA  apixaban  (ELIQUIS ) 5 MG TABS tablet Take 1 tablet (5 mg total) by mouth 2 (two) times daily. 04/18/24   Lelon Hamilton T, PA-C  atorvastatin  (LIPITOR) 20 MG tablet Take 1 tablet (20 mg total) by mouth at bedtime. 05/14/24   Meng, Hao, PA  brimonidine -timolol  (COMBIGAN ) 0.2-0.5 % ophthalmic solution Place 1 drop into both eyes every 12 (twelve) hours.    [provider]  empagliflozin  (JARDIANCE ) 10 MG TABS tablet Take 1 tablet (10 mg total) by mouth daily before breakfast. 05/14/24   Meng, Hao, PA  furosemide  (LASIX ) 20 MG tablet Take 1 tablet (20  mg total) by mouth daily. 05/14/24 08/12/24  Meng, Hao, PA  hydrALAZINE  (APRESOLINE ) 100 MG tablet Take 1 tablet (100 mg total) by mouth every 8 (eight) hours. 01/06/23   Patsy Lenis, MD  magnesium  oxide (MAG-OX) 400 MG tablet Take 800 mg by mouth 2 (two) times daily. 01/28/24   [provider]  metoprolol  tartrate (LOPRESSOR ) 100 MG tablet Take 1 tablet (100 mg total) by mouth 2 (two) times daily. 04/18/24   Lelon Hamilton T, PA-C  mycophenolate  (CELLCEPT ) 200 MG/ML suspension Take 1,000 mg by mouth 2 (two) times a day. 03/20/18   [provider]  predniSONE  5 MG/5ML solution Take 5 mg by mouth daily. 11/18/18   [provider]  spironolactone  (ALDACTONE ) 25 MG tablet Take 0.5 tablet (12.5 mg total) by mouth daily. 04/20/24   Lelon Hamilton T, PA-C  tacrolimus  (PROGRAF ) 1 MG capsule Take 2-3  mg by mouth See admin instructions. Take 3 mg by mouth in the morning and 2 mg at bedtime    [provider]  VYZULTA  0.024 % SOLN Place 1 drop into both eyes at bedtime.    [provider]  fluticasone  (FLOVENT  HFA) 44 MCG/ACT inhaler Inhale 1 puff into the lungs 2 (two) times daily. Patient not taking: No sig reported 03/08/17 03/04/21  Shellia Oh, MD    Physical Exam: Vitals:   08/07/24 1707 08/07/24 1800 08/07/24 1945 08/07/24 2100  BP: 95/63  111/74 108/77  Pulse: 68  66 66  Resp: 18  (!) 23 (!) 21  Temp: 98.8 F (37.1 C)   99 F (37.2 C)  TempSrc: Oral   Oral  SpO2: 99%  97% 98%  Weight:  112 kg     General:  Appears calm and comfortable and is in NAD Eyes:  PERRL, EOMI, normal lids, iris ENT:  grossly normal hearing, lips & tongue, mmm; appropriate dentition Neck:  no LAD, masses or thyromegaly; no carotid bruits Cardiovascular:  RRR, no m/r/g. No LE edema.  Respiratory:   CTA bilaterally with no wheezes/rales/rhonchi.  Normal respiratory effort. Abdomen:  soft, TTP in epigastric area, ND, NABS Back:   normal alignment, no CVAT Skin:  no rash or  induration seen on limited exam Musculoskeletal:  grossly normal tone BUE/BLE, good ROM, no bony abnormality Lower extremity:  No LE edema.  Limited foot exam with no ulcerations.  2+ distal pulses. Psychiatric:  grossly normal mood and affect, speech fluent and appropriate, AOx3 Neurologic:  CN 2-12 grossly intact, moves all extremities in coordinated fashion, sensation intact   Radiological Exams on Admission: Independently reviewed - see discussion in A/P where applicable  No results found.  EKG: Independently reviewed.  NSR with rate 67; nonspecific ST changes with no evidence of acute ischemia. LVH.  no acute changes     Labs on Admission: I have personally reviewed the available labs and imaging studies at the time of the admission.  Pertinent labs:   wbc: 12.8,  hgb: 11.8,  BUN: 42,  creatinine: 1.71,  INR: 1.4,  Assessment and Plan: Principal Problem:   Upper GI bleed Active Problems:   Paroxysmal atrial fibrillation (HCC)   Anemia due to chronic kidney disease   CKD stage 3a, GFR 45-59 ml/min (HCC)   Coronary artery disease involving native coronary artery of native heart without angina pectoris   S/p cadaver renal transplant   Hypertension   Gastroesophageal reflux disease without esophagitis    Assessment and Plan: * Upper GI bleed 69 year old presenting to ED with one day history of diarrhea and hematemesis found to have an upper GI bleed -admit to stepdown -eliquis  reversed with Kcentra   -GI consulted -PPI bolus followed by 40mg  IV BID -gentle IVF at 40cc/hour x 6 hours  -NPO -has had 2 more copious, dark hematemesis episodes in ED  -check stool PCR with diarrhea  -trend cbc q6 -type and screen with Ab in blood, so may take time to get blood  -zofran  PRN  -does not use any NSAIDs, drink alcohol. Denies any prior history of GI bleeds/ulcers  -updated GI   Paroxysmal atrial fibrillation (HCC) Last took eliquis  at 9;00AM Given kcentra  in ED to reverse  eliquis   Has active upper GI bleed Continue on monitor  Rate controlled in NSR  Start back rate control drugs when no longer NPO IV metoprolol  PRN   Anemia due to chronic kidney disease Baseline  normal, 13-14 Presenting with hgb of 11.8>12.3  Type and screen  Transfuse if hgb <8  Trend cbc q 6 hours   CKD stage 3a, GFR 45-59 ml/min (HCC) Creatinine: 1.4-1.7 baseline BUN elevated in setting of GI bleed Continue to trend    Coronary artery disease involving native coronary artery of native heart without angina pectoris Continue  medical management when no longer NPO   S/p cadaver renal transplant Continue prograf  and cellcept    Hypertension Soft in setting of GI bleed, hold anti HTN  IV metoprolol  with parameters if needed   Gastroesophageal reflux disease without esophagitis Bolus portonix followed by 40mg  BID for UGIB      Advance Care Planning:   Code Status: Full Code    Consultants: GI: Dr. Abran    Procedures: Plan for EGD tomorrow unless needed urgently    Antibiotics: None     DVT Prophylaxis: SCDs   Family Communication: none   Severity of Illness: The appropriate patient status for this patient is INPATIENT. Inpatient status is judged to be reasonable and necessary in order to provide the required intensity of service to ensure the patient's safety. The patient's presenting symptoms, physical exam findings, and initial radiographic and laboratory data in the context of their chronic comorbidities is felt to place them at high risk for further clinical deterioration. Furthermore, it is not anticipated that the patient will be medically stable for discharge from the hospital within 2 midnights of admission.   * I certify that at the point of admission it is my clinical judgment that the patient will require inpatient hospital care spanning beyond 2 midnights from the point of admission due to high intensity of service, high risk for further deterioration  and high frequency of surveillance required.*  Author: Isaiah Geralds, MD 08/07/2024 9:42 PM  For on call review [email or web address].      [1] No Known Allergies  "

## 2024-08-07 NOTE — Assessment & Plan Note (Signed)
 Creatinine: 1.4-1.7 baseline BUN elevated in setting of GI bleed Continue to trend

## 2024-08-08 ENCOUNTER — Inpatient Hospital Stay (HOSPITAL_COMMUNITY): Admitting: Anesthesiology

## 2024-08-08 ENCOUNTER — Encounter (HOSPITAL_COMMUNITY): Payer: Self-pay | Admitting: Family Medicine

## 2024-08-08 ENCOUNTER — Encounter (HOSPITAL_COMMUNITY): Admission: EM | Disposition: A | Payer: Self-pay | Source: Home / Self Care | Attending: Internal Medicine

## 2024-08-08 DIAGNOSIS — K226 Gastro-esophageal laceration-hemorrhage syndrome: Principal | ICD-10-CM

## 2024-08-08 DIAGNOSIS — I2510 Atherosclerotic heart disease of native coronary artery without angina pectoris: Secondary | ICD-10-CM

## 2024-08-08 DIAGNOSIS — Z940 Kidney transplant status: Secondary | ICD-10-CM

## 2024-08-08 DIAGNOSIS — K921 Melena: Secondary | ICD-10-CM | POA: Diagnosis not present

## 2024-08-08 DIAGNOSIS — I110 Hypertensive heart disease with heart failure: Secondary | ICD-10-CM

## 2024-08-08 DIAGNOSIS — Z7901 Long term (current) use of anticoagulants: Secondary | ICD-10-CM | POA: Diagnosis not present

## 2024-08-08 DIAGNOSIS — K922 Gastrointestinal hemorrhage, unspecified: Secondary | ICD-10-CM | POA: Diagnosis not present

## 2024-08-08 DIAGNOSIS — R1013 Epigastric pain: Secondary | ICD-10-CM | POA: Diagnosis not present

## 2024-08-08 DIAGNOSIS — K920 Hematemesis: Secondary | ICD-10-CM

## 2024-08-08 DIAGNOSIS — D62 Acute posthemorrhagic anemia: Secondary | ICD-10-CM

## 2024-08-08 DIAGNOSIS — I5033 Acute on chronic diastolic (congestive) heart failure: Secondary | ICD-10-CM

## 2024-08-08 DIAGNOSIS — I4891 Unspecified atrial fibrillation: Secondary | ICD-10-CM

## 2024-08-08 DIAGNOSIS — K6389 Other specified diseases of intestine: Secondary | ICD-10-CM | POA: Diagnosis not present

## 2024-08-08 DIAGNOSIS — R579 Shock, unspecified: Secondary | ICD-10-CM

## 2024-08-08 DIAGNOSIS — G8929 Other chronic pain: Secondary | ICD-10-CM

## 2024-08-08 HISTORY — PX: SCLEROTHERAPY: SHX6841

## 2024-08-08 HISTORY — PX: ESOPHAGOGASTRODUODENOSCOPY: SHX5428

## 2024-08-08 HISTORY — PX: HEMOSTASIS CLIP PLACEMENT: SHX6857

## 2024-08-08 LAB — CBC
HCT: 35.5 % — ABNORMAL LOW (ref 39.0–52.0)
HCT: 29.9 % — ABNORMAL LOW (ref 39.0–52.0)
HCT: 25.3 % — ABNORMAL LOW (ref 39.0–52.0)
Hemoglobin: 11 g/dL — ABNORMAL LOW (ref 13.0–17.0)
Hemoglobin: 9.5 g/dL — ABNORMAL LOW (ref 13.0–17.0)
Hemoglobin: 8 g/dL — ABNORMAL LOW (ref 13.0–17.0)
MCH: 28.1 pg (ref 26.0–34.0)
MCH: 28.6 pg (ref 26.0–34.0)
MCH: 28.4 pg (ref 26.0–34.0)
MCHC: 31 g/dL (ref 30.0–36.0)
MCHC: 31.8 g/dL (ref 30.0–36.0)
MCHC: 31.6 g/dL (ref 30.0–36.0)
MCV: 90.6 fL (ref 80.0–100.0)
MCV: 90.1 fL (ref 80.0–100.0)
MCV: 89.7 fL (ref 80.0–100.0)
Platelets: 116 K/uL — ABNORMAL LOW (ref 150–400)
Platelets: 113 K/uL — ABNORMAL LOW (ref 150–400)
Platelets: 83 K/uL — ABNORMAL LOW (ref 150–400)
RBC: 3.92 MIL/uL — ABNORMAL LOW (ref 4.22–5.81)
RBC: 3.32 MIL/uL — ABNORMAL LOW (ref 4.22–5.81)
RBC: 2.82 MIL/uL — ABNORMAL LOW (ref 4.22–5.81)
RDW: 14.9 % (ref 11.5–15.5)
RDW: 14.9 % (ref 11.5–15.5)
RDW: 15 % (ref 11.5–15.5)
WBC: 10.8 K/uL — ABNORMAL HIGH (ref 4.0–10.5)
WBC: 8.5 K/uL (ref 4.0–10.5)
WBC: 8 K/uL (ref 4.0–10.5)
nRBC: 0 % (ref 0.0–0.2)
nRBC: 0.2 % (ref 0.0–0.2)
nRBC: 0 % (ref 0.0–0.2)

## 2024-08-08 LAB — GLUCOSE, CAPILLARY
Glucose-Capillary: 117 mg/dL — ABNORMAL HIGH (ref 70–99)
Glucose-Capillary: 109 mg/dL — ABNORMAL HIGH (ref 70–99)

## 2024-08-08 LAB — BASIC METABOLIC PANEL WITH GFR
Anion gap: 10 (ref 5–15)
Anion gap: 9 (ref 5–15)
Anion gap: 9 (ref 5–15)
BUN: 45 mg/dL — ABNORMAL HIGH (ref 8–23)
BUN: 50 mg/dL — ABNORMAL HIGH (ref 8–23)
BUN: 49 mg/dL — ABNORMAL HIGH (ref 8–23)
CO2: 20 mmol/L — ABNORMAL LOW (ref 22–32)
CO2: 21 mmol/L — ABNORMAL LOW (ref 22–32)
CO2: 20 mmol/L — ABNORMAL LOW (ref 22–32)
Calcium: 8.6 mg/dL — ABNORMAL LOW (ref 8.9–10.3)
Calcium: 8.5 mg/dL — ABNORMAL LOW (ref 8.9–10.3)
Calcium: 8.8 mg/dL — ABNORMAL LOW (ref 8.9–10.3)
Chloride: 107 mmol/L (ref 98–111)
Chloride: 107 mmol/L (ref 98–111)
Chloride: 110 mmol/L (ref 98–111)
Creatinine, Ser: 1.9 mg/dL — ABNORMAL HIGH (ref 0.61–1.24)
Creatinine, Ser: 2.16 mg/dL — ABNORMAL HIGH (ref 0.61–1.24)
Creatinine, Ser: 2.11 mg/dL — ABNORMAL HIGH (ref 0.61–1.24)
GFR, Estimated: 38 mL/min — ABNORMAL LOW
GFR, Estimated: 33 mL/min — ABNORMAL LOW
GFR, Estimated: 34 mL/min — ABNORMAL LOW
Glucose, Bld: 145 mg/dL — ABNORMAL HIGH (ref 70–99)
Glucose, Bld: 150 mg/dL — ABNORMAL HIGH (ref 70–99)
Glucose, Bld: 138 mg/dL — ABNORMAL HIGH (ref 70–99)
Potassium: 5.6 mmol/L — ABNORMAL HIGH (ref 3.5–5.1)
Potassium: 5.8 mmol/L — ABNORMAL HIGH (ref 3.5–5.1)
Potassium: 4.9 mmol/L (ref 3.5–5.1)
Sodium: 137 mmol/L (ref 135–145)
Sodium: 137 mmol/L (ref 135–145)
Sodium: 139 mmol/L (ref 135–145)

## 2024-08-08 LAB — POCT I-STAT EG7
Acid-Base Excess: 0 mmol/L (ref 0.0–2.0)
Bicarbonate: 26.7 mmol/L (ref 20.0–28.0)
Calcium, Ion: 1.15 mmol/L (ref 1.15–1.40)
HCT: 28 % — ABNORMAL LOW (ref 39.0–52.0)
Hemoglobin: 9.5 g/dL — ABNORMAL LOW (ref 13.0–17.0)
O2 Saturation: 58 %
Potassium: 5.9 mmol/L — ABNORMAL HIGH (ref 3.5–5.1)
Sodium: 139 mmol/L (ref 135–145)
TCO2: 28 mmol/L (ref 22–32)
pCO2, Ven: 51.1 mmHg (ref 44–60)
pH, Ven: 7.326 (ref 7.25–7.43)
pO2, Ven: 33 mmHg (ref 32–45)

## 2024-08-08 LAB — HEMOGLOBIN AND HEMATOCRIT, BLOOD
HCT: 32.4 % — ABNORMAL LOW (ref 39.0–52.0)
Hemoglobin: 10 g/dL — ABNORMAL LOW (ref 13.0–17.0)

## 2024-08-08 LAB — GASTROINTESTINAL PANEL BY PCR, STOOL (REPLACES STOOL CULTURE)

## 2024-08-08 LAB — CBC WITH DIFFERENTIAL/PLATELET
Abs Immature Granulocytes: 0.05 K/uL (ref 0.00–0.07)
Basophils Absolute: 0 K/uL (ref 0.0–0.1)
Basophils Relative: 0 %
Eosinophils Absolute: 0.1 K/uL (ref 0.0–0.5)
Eosinophils Relative: 1 %
HCT: 25.6 % — ABNORMAL LOW (ref 39.0–52.0)
Hemoglobin: 7.9 g/dL — ABNORMAL LOW (ref 13.0–17.0)
Immature Granulocytes: 1 %
Lymphocytes Relative: 15 %
Lymphs Abs: 1.2 K/uL (ref 0.7–4.0)
MCH: 28.8 pg (ref 26.0–34.0)
MCHC: 30.9 g/dL (ref 30.0–36.0)
MCV: 93.4 fL (ref 80.0–100.0)
Monocytes Absolute: 0.9 K/uL (ref 0.1–1.0)
Monocytes Relative: 11 %
Neutro Abs: 5.7 K/uL (ref 1.7–7.7)
Neutrophils Relative %: 72 %
Platelets: 77 K/uL — ABNORMAL LOW (ref 150–400)
RBC: 2.74 MIL/uL — ABNORMAL LOW (ref 4.22–5.81)
RDW: 15.7 % — ABNORMAL HIGH (ref 11.5–15.5)
WBC: 7.9 K/uL (ref 4.0–10.5)
nRBC: 0 % (ref 0.0–0.2)

## 2024-08-08 LAB — PREPARE RBC (CROSSMATCH)

## 2024-08-08 MED ORDER — PHENYLEPHRINE HCL-NACL 20-0.9 MG/250ML-% IV SOLN
INTRAVENOUS | Status: DC | PRN
  Administered 2024-08-08: 100 ug/min via INTRAVENOUS

## 2024-08-08 MED ORDER — TACROLIMUS 1 MG PO CAPS
2.0000 mg | ORAL_CAPSULE | Freq: Every day | ORAL | Status: DC
  Administered 2024-08-08 – 2024-08-11 (×4): 2 mg via ORAL
  Filled 2024-08-08 (×4): qty 2

## 2024-08-08 MED ORDER — EPHEDRINE SULFATE (PRESSORS) 25 MG/5ML IV SOSY
PREFILLED_SYRINGE | INTRAVENOUS | Status: DC | PRN
  Administered 2024-08-08 (×2): 10 mg via INTRAVENOUS
  Administered 2024-08-08: 5 mg via INTRAVENOUS

## 2024-08-08 MED ORDER — SODIUM CHLORIDE 0.9% IV SOLUTION: Freq: Once | INTRAVENOUS | Status: AC

## 2024-08-08 MED ORDER — METOPROLOL TARTRATE 5 MG/5ML IV SOLN
INTRAVENOUS | Status: AC
  Filled 2024-08-08: qty 5

## 2024-08-08 MED ORDER — LIDOCAINE 2% (20 MG/ML) 5 ML SYRINGE
INTRAMUSCULAR | Status: DC | PRN
  Administered 2024-08-08: 60 mg via INTRAVENOUS

## 2024-08-08 MED ORDER — ONDANSETRON HCL 4 MG/2ML IJ SOLN: 4.0000 mg | Freq: Once | INTRAMUSCULAR | Status: DC | PRN

## 2024-08-08 MED ORDER — SODIUM CHLORIDE (PF) 0.9 % IJ SOLN
PREFILLED_SYRINGE | INTRAVENOUS | Status: DC | PRN
  Administered 2024-08-08: 2 mL

## 2024-08-08 MED ORDER — ONDANSETRON HCL 4 MG/2ML IJ SOLN: 4.0000 mg | Freq: Four times a day (QID) | INTRAMUSCULAR | Status: DC

## 2024-08-08 MED ORDER — DEXAMETHASONE SOD PHOSPHATE PF 10 MG/ML IJ SOLN
INTRAMUSCULAR | Status: DC | PRN
  Administered 2024-08-08: 5 mg via INTRAVENOUS

## 2024-08-08 MED ORDER — SODIUM CHLORIDE 0.9 % IV BOLUS
1000.0000 mL | Freq: Once | INTRAVENOUS | Status: AC
  Administered 2024-08-08: 1000 mL via INTRAVENOUS

## 2024-08-08 MED ORDER — SODIUM ZIRCONIUM CYCLOSILICATE 10 G PO PACK
10.0000 g | PACK | Freq: Once | ORAL | Status: AC
  Administered 2024-08-08: 10 g via ORAL
  Filled 2024-08-08: qty 1

## 2024-08-08 MED ORDER — ALBUMIN HUMAN 5 % IV SOLN
12.5000 g | Freq: Once | INTRAVENOUS | Status: AC
  Administered 2024-08-08: 12.5 g via INTRAVENOUS

## 2024-08-08 MED ORDER — FENTANYL CITRATE (PF) 50 MCG/ML IJ SOSY: 12.5000 ug | PREFILLED_SYRINGE | INTRAMUSCULAR | Status: DC | PRN

## 2024-08-08 MED ORDER — FENTANYL CITRATE (PF) 100 MCG/2ML IJ SOLN: 25.0000 ug | INTRAMUSCULAR | Status: DC | PRN

## 2024-08-08 MED ORDER — PROPOFOL 10 MG/ML IV BOLUS
INTRAVENOUS | Status: DC | PRN
  Administered 2024-08-08: 200 mg via INTRAVENOUS

## 2024-08-08 MED ORDER — METOPROLOL TARTRATE 25 MG PO TABS
25.0000 mg | ORAL_TABLET | Freq: Two times a day (BID) | ORAL | Status: DC
  Administered 2024-08-08 – 2024-08-10 (×5): 25 mg via ORAL
  Filled 2024-08-08 (×5): qty 1

## 2024-08-08 MED ORDER — PREDNISONE 5 MG/5ML PO SOLN
5.0000 mg | Freq: Every day | ORAL | Status: DC
  Administered 2024-08-08 – 2024-08-12 (×5): 5 mg via ORAL
  Filled 2024-08-08 (×5): qty 5

## 2024-08-08 MED ORDER — AMIODARONE HCL 200 MG PO TABS
200.0000 mg | ORAL_TABLET | Freq: Every day | ORAL | Status: DC
  Administered 2024-08-08 – 2024-08-12 (×5): 200 mg via ORAL
  Filled 2024-08-08 (×5): qty 1

## 2024-08-08 MED ORDER — SUCCINYLCHOLINE CHLORIDE 200 MG/10ML IV SOSY
PREFILLED_SYRINGE | INTRAVENOUS | Status: DC | PRN
  Administered 2024-08-08: 140 mg via INTRAVENOUS

## 2024-08-08 MED ORDER — CALCIUM CHLORIDE 10 % IV SOLN
INTRAVENOUS | Status: DC | PRN
  Administered 2024-08-08: 100 mg via INTRAVENOUS
  Administered 2024-08-08 (×2): 200 mg via INTRAVENOUS
  Administered 2024-08-08: 500 mg via INTRAVENOUS

## 2024-08-08 MED ORDER — ALBUMIN HUMAN 5 % IV SOLN
INTRAVENOUS | Status: AC
  Filled 2024-08-08: qty 500

## 2024-08-08 MED ORDER — SODIUM CHLORIDE 0.9 % IV SOLN: INTRAVENOUS | Status: DC

## 2024-08-08 MED ORDER — ONDANSETRON HCL 4 MG/2ML IJ SOLN
INTRAMUSCULAR | Status: DC | PRN
  Administered 2024-08-08: 4 mg via INTRAVENOUS

## 2024-08-08 MED ORDER — TACROLIMUS 1 MG PO CAPS
3.0000 mg | ORAL_CAPSULE | Freq: Every day | ORAL | Status: DC
  Administered 2024-08-09 – 2024-08-12 (×4): 3 mg via ORAL
  Filled 2024-08-08 (×5): qty 3

## 2024-08-08 MED ORDER — LACTATED RINGERS IV BOLUS
500.0000 mL | Freq: Once | INTRAVENOUS | Status: AC
  Administered 2024-08-08: 500 mL via INTRAVENOUS

## 2024-08-08 MED ORDER — METOPROLOL TARTRATE 5 MG/5ML IV SOLN
INTRAVENOUS | Status: DC | PRN
  Administered 2024-08-08: 2 mg via INTRAVENOUS
  Administered 2024-08-08: 3 mg via INTRAVENOUS

## 2024-08-08 MED ORDER — ALBUMIN HUMAN 5 % IV SOLN: INTRAVENOUS | Status: DC | PRN

## 2024-08-08 MED ORDER — ONDANSETRON HCL 4 MG/2ML IJ SOLN
4.0000 mg | Freq: Four times a day (QID) | INTRAMUSCULAR | Status: AC
  Administered 2024-08-09 (×3): 4 mg via INTRAVENOUS
  Filled 2024-08-08 (×3): qty 2

## 2024-08-08 MED ORDER — FENTANYL CITRATE (PF) 50 MCG/ML IJ SOSY
12.5000 ug | PREFILLED_SYRINGE | INTRAMUSCULAR | Status: AC | PRN
  Administered 2024-08-08 (×2): 12.5 ug via INTRAVENOUS
  Filled 2024-08-08 (×2): qty 1

## 2024-08-08 MED ORDER — PHENYLEPHRINE 80 MCG/ML (10ML) SYRINGE FOR IV PUSH (FOR BLOOD PRESSURE SUPPORT)
PREFILLED_SYRINGE | INTRAVENOUS | Status: DC | PRN
  Administered 2024-08-08: 240 ug via INTRAVENOUS
  Administered 2024-08-08 (×3): 160 ug via INTRAVENOUS

## 2024-08-08 MED ORDER — MYCOPHENOLATE MOFETIL 200 MG/ML PO SUSR
1000.0000 mg | Freq: Two times a day (BID) | ORAL | Status: DC
  Administered 2024-08-08 – 2024-08-12 (×8): 1000 mg via ORAL
  Filled 2024-08-08 (×8): qty 5

## 2024-08-08 NOTE — Plan of Care (Signed)
  Problem: Education: Goal: Knowledge of General Education information will improve Description: Including pain rating scale, medication(s)/side effects and non-pharmacologic comfort measures Outcome: Progressing   Problem: Clinical Measurements: Goal: Ability to maintain clinical measurements within normal limits will improve Outcome: Progressing Goal: Will remain free from infection Outcome: Progressing Goal: Diagnostic test results will improve Outcome: Progressing Goal: Respiratory complications will improve Outcome: Progressing Goal: Cardiovascular complication will be avoided Outcome: Progressing   Problem: Elimination: Goal: Will not experience complications related to bowel motility Outcome: Progressing Goal: Will not experience complications related to urinary retention Outcome: Progressing   Problem: Pain Managment: Goal: General experience of comfort will improve and/or be controlled Outcome: Progressing   Problem: Safety: Goal: Ability to remain free from injury will improve Outcome: Progressing

## 2024-08-08 NOTE — Anesthesia Postprocedure Evaluation (Signed)
"   Anesthesia Post Note  Patient: Animal Nutritionist  Procedure(s) Performed: EGD (ESOPHAGOGASTRODUODENOSCOPY) SCLEROTHERAPY CONTROL OF HEMORRHAGE, GI TRACT, ENDOSCOPIC, BY CLIPPING OR OVERSEWING     Patient location during evaluation: PACU Anesthesia Type: General Level of consciousness: awake and alert, oriented and patient cooperative Pain management: pain level controlled Vital Signs Assessment: post-procedure vital signs reviewed and stable Respiratory status: spontaneous breathing, nonlabored ventilation and respiratory function stable Cardiovascular status: blood pressure returned to baseline and stable Postop Assessment: no apparent nausea or vomiting Anesthetic complications: no   No notable events documented.  Last Vitals:  Vitals:   08/08/24 1405 08/08/24 1410  BP: 121/77 108/82  Pulse: (!) 111 (!) 112  Resp: 19 19  Temp:    SpO2: 100% 98%    Last Pain:  Vitals:   08/08/24 1410  TempSrc:   PainSc: 0-No pain                 Jon [name]      "

## 2024-08-08 NOTE — Plan of Care (Signed)
" °  Problem: Clinical Measurements: Goal: Respiratory complications will improve Outcome: Progressing   Problem: Clinical Measurements: Goal: Diagnostic test results will improve Outcome: Not Progressing Goal: Cardiovascular complication will be avoided Outcome: Not Progressing   Problem: Activity: Goal: Risk for activity intolerance will decrease Outcome: Not Progressing   Problem: Nutrition: Goal: Adequate nutrition will be maintained Outcome: Not Progressing   Problem: Elimination: Goal: Will not experience complications related to urinary retention Outcome: Not Progressing   Problem: Bowel/Gastric: Goal: Gastrointestinal status for postoperative course will improve Outcome: Not Progressing   "

## 2024-08-08 NOTE — Progress Notes (Addendum)
 Notified Dr. Arlice of patient's decreased pressure, current BP 105/48, HR 138; bolus started. No order for NEO at this time and unsure if it was still running after patient left PACU.  Per Dr. Arlice, verbal to continue NEO from before and CCM will be consulted. This nurse has patient in trendelenburg position and NEO running at 10 mcg/h until further instructions since current BP 88/48.    1555, CCM at bedside. Current BP 115/70, NEO 10 mcg/hr turned off. Continue to monitor patient at this time for further eval of BP stabilization.

## 2024-08-08 NOTE — Progress Notes (Signed)
 " PROGRESS NOTE  Jon [name]  DOB: [birth year]  PCP: [facility] FMW:[id number]  DOA: 08/07/2024  LOS: 1 day  Hospital Day: 2  Subjective: Patient was seen and examined this morning. Pleasant elderly African-American male.  Lying on bed.  Not in distress.  Tried to catch up last sleep from last night.  Family not at bedside Event from earlier this morning noted, he had hematemesis and dark red bowel movement.  He also becoming hypotensive and diaphoretic. This morning, blood pressure in 90s Labs from this morning with potassium 5.6, BUN/creatinine 45/1.9, hemoglobin 10.  Brief narrative: Jon [name] is a 69 y.o. male with PMH significant for Cadaver renal transplantation 2018, transplant CKD, NICM with severe LVH, PAF on Eliquis , HTN. 1/9, patient presented to the ED after an episode of vomiting of blood.  Reported few episodes of diarrhea as well.  Has no prior history of GI bleeding, does not drink alcohol, does not use NSAIDs  While in the ED he had 2 more episodes of hematemesis.  Blood pressure initially 95/63, heart rate 68, breathing on room air Initial hemoglobin 11.8, creatinine 1.71 Patient was started on Protonix  drip, given 1 dose of Kcentra , IV fluid GI consulted Admitted to [facility] Overnight, patient had 2 large dark red bowel movements and another episode of hematemesis.  Assessment and plan: Upper GI bleed Presented with hematemesis in the setting of Eliquis  use. Given IV fluid, IV Kcentra  and started on Protonix  drip Overnight, patient had 2 large dark red bowel movements and another episode of hematemesis.  Also became hypotensive and diaphoretic.   Remains n.p.o. for possible intervention. Eliquis  has been stopped.  Not on NSAIDs, does not drink alcohol. Recent Labs    08/07/24 1955 08/07/24 2203 08/08/24 0238 08/08/24 0418 08/08/24 0825  HGB 12.3* 11.4* 11.0* 10.0* 9.5*  MCV  --  89.5 90.6  --  90.1   Acute blood loss anemia Anemia chronic  disease Hemoglobin baseline above 11.  Only mild drop in hemoglobin noted despite significant hematemesis and hematochezia.  Last CBC checked this morning at 8:30 showed hemoglobin of 9.5. I will hold off on any PRBC transfusion for now.  Continue to monitor CBC Recent Labs    08/07/24 1955 08/07/24 2203 08/08/24 0238 08/08/24 0418 08/08/24 0825  HGB 12.3* 11.4* 11.0* 10.0* 9.5*  MCV  --  89.5 90.6  --  90.1   Hypotension H/o hypertension H/o NICM with severe LVH PTA meds- metoprolol , amlodipine , Jardiance , Lasix , Aldactone , hydralazine , Currently on hold given low blood pressure.  Pharmacy confirm the home med list  Paroxysmal atrial fibrillation Long-term anticoagulation PTA meds- amiodarone , metoprolol . Meds on hold currently for possible need of intervention. Chronically anticoagulated Eliquis , last dose was 9 AM on 1/9 Eliquis  currently on hold  Cadaver renal transplant status 2018  Transplant CKD 3 A Baseline creatinine less than 2.  Creatinine remains at baseline.  BUN elevated in setting of GI bleed. Continue to trend  Continue prograf  and cellcept   Follows up with nephrologist Dr. Marlee.  Recent Labs    08/16/23 0420 08/29/23 0940 04/15/24 1622 04/15/24 1629 04/16/24 0505 04/17/24 0438 04/18/24 0428 04/23/24 0947 08/07/24 1757 08/07/24 1803 08/08/24 0238 08/08/24 0825  BUN 22 15 10 10 9 14 22 15  42* 42* 45* 50*  CREATININE 1.58* 1.52* 1.49* 1.50* 1.44* 1.70* 2.00* 1.74* 1.90* 1.71* 1.90* 2.16*  CO2 22 19* 19*  --  24 23 24  18*  --  20* 20* 21*   HypERkalemia Potassium was  elevated to 5.6 this morning. Repeat this morning showed potassium at 5.8.  1 dose of Lokelma  ordered Recent Labs  Lab 08/07/24 1757 08/07/24 1803 08/08/24 0238 08/08/24 0825  K 5.0 5.0 5.6* 5.8*   H/o CAD Currently has no anginal symptoms  PTA meds-Eliquis , statin Oral meds on hold currently  Home med list has not been obtained/updated by [facility] at this time.  Please  double check admission reconciliation.    Nutrition Status:         Mobility: Able to ambulate independently  PT Orders:   PT Follow up Rec:     Goals of care   Code Status: Full Code     DVT prophylaxis:  SCDs Start: 08/07/24 2007 Place TED hose Start: 08/07/24 2007   Antimicrobials: None Fluid: None Consultants: GI Family Communication: None at bedside  Status: Inpatient Level of care:  Stepdown   Patient is from: Home Needs to continue in-hospital care: Pending GI eval Anticipated d/c to: Home in 1 to 2 days likely     Diet:  Diet Order             Diet NPO time specified  Diet effective now                   Scheduled Meds:  sodium chloride    Intravenous Once   Chlorhexidine  Gluconate Cloth  6 each Topical Daily   pantoprazole  (PROTONIX ) IV  40 mg Intravenous Q12H   sodium zirconium cyclosilicate   10 g Oral Once    PRN meds: acetaminophen  **OR** acetaminophen , fentaNYL  (SUBLIMAZE ) injection, metoprolol  tartrate, ondansetron  **OR** ondansetron  (ZOFRAN ) IV   Infusions:    Antimicrobials: Anti-infectives (From admission, onward)    None       Objective: Vitals:   08/08/24 0800 08/08/24 0913  BP: (!) 92/54   Pulse: 79   Resp: 19   Temp:  97.9 F (36.6 C)  SpO2: 100%     Intake/Output Summary (Last 24 hours) at 08/08/2024 1002 Last data filed at 08/08/2024 0624 Gross per 24 hour  Intake 1452.5 ml  Output 30 ml  Net 1422.5 ml   Filed Weights   08/07/24 1800 08/07/24 2200  Weight: 112 kg 106 kg   Weight change:  Body mass index is 31.69 kg/m.   Physical Exam: General exam: Pleasant, elderly African-American male Skin: No rashes, lesions or ulcers. HEENT: Atraumatic, normocephalic, no obvious bleeding Lungs: Clear to auscultation bilaterally,  CVS: S1, S2, no murmur,   GI/Abd: Soft, nontender, nondistended, bowel sound present,   CNS: Alert, awake, alert x 3 Psychiatry: Mood appropriate Extremities: No pedal edema,  no calf tenderness  Data Review: I have personally reviewed the laboratory data and studies available.  F/u labs  Unresulted Labs (From admission, onward)     Start     Ordered   08/09/24 0500  CBC with Differential/Platelet  Tomorrow morning,   R       Question:  Specimen collection method  Answer:  Lab=Lab collect   08/08/24 1002   08/09/24 0500  Basic metabolic panel with GFR  Tomorrow morning,   R       Question:  Specimen collection method  Answer:  Lab=Lab collect   08/08/24 1002   08/08/24 1200  CBC  Once-Timed,   TIMED       Question:  Specimen collection method  Answer:  Lab=Lab collect   08/08/24 1002            Signed, Esiquio Boesen,  MD [facility] 08/08/2024  "

## 2024-08-08 NOTE — Anesthesia Procedure Notes (Signed)
 Procedure Name: Intubation Date/Time: 08/08/2024 1:04 PM  Performed by: Cena Epps, CRNAPre-anesthesia Checklist: Patient identified, Emergency Drugs available, Suction available and Patient being monitored Patient Re-evaluated:Patient Re-evaluated prior to induction Oxygen Delivery Method: Circle System Utilized Preoxygenation: Pre-oxygenation with 100% oxygen Induction Type: IV induction, Rapid sequence and Cricoid Pressure applied Laryngoscope Size: Glidescope and 4 Grade View: Grade I Tube type: Oral Tube size: 7.5 mm Number of attempts: 1 Airway Equipment and Method: Stylet and Oral airway Placement Confirmation: ETT inserted through vocal cords under direct vision, positive ETCO2 and breath sounds checked- equal and bilateral Secured at: 24 cm Tube secured with: Tape Dental Injury: Teeth and Oropharynx as per pre-operative assessment

## 2024-08-08 NOTE — Progress Notes (Signed)
 Patient expressed to this nurse that he has trouble swallowing food/pills at home and either chews pills or has medication given in liquid form. This nurse ordered SLP eval for continuity of care.

## 2024-08-08 NOTE — Anesthesia Preprocedure Evaluation (Addendum)
 "                                  Anesthesia Evaluation  Patient identified by MRN, date of birth, ID band Patient awake    Reviewed: Allergy & Precautions, H&P , NPO status , Patient's Chart, lab work & pertinent test results, reviewed documented beta blocker date and time   Airway Mallampati: IV  TM Distance: >3 FB Neck ROM: Full    Dental  (+) Teeth Intact, Dental Advisory Given   Pulmonary asthma    Pulmonary exam normal breath sounds clear to auscultation       Cardiovascular hypertension, Pt. on medications and Pt. on home beta blockers + CAD, + Past MI and +CHF (normal LVEF with severe LVH, mild reduction RV function)  Normal cardiovascular exam+ dysrhythmias (eliquis  on hold, INR 1.4) Atrial Fibrillation  Rhythm:Regular Rate:Normal  Echo 03/2024:  1. Left ventricular ejection fraction, by estimation, is 50 to 55%. The  left ventricle has low normal function. There is severe concentric left  ventricular hypertrophy. The average left ventricular global longitudinal  strain is -9.0 %. The global  longitudinal strain is abnormal and demonstrates a pattern of relative  apical sparing (obtained transthoracic).   2. Right ventricular systolic function is mildly reduced. The right  ventricular size is normal. Mildly increased right ventricular wall  thickness.   3. Left atrial size was severely dilated. No left atrial/left atrial  appendage thrombus was detected. The LAA emptying velocity was 43 cm/s.   4. A small pericardial effusion is present.   5. The mitral valve is grossly normal. Trivial mitral valve  regurgitation. No evidence of mitral stenosis.   6. The aortic valve is tricuspid. There is moderate calcification of the  aortic valve. Aortic valve regurgitation is trivial. Aortic valve mean  gradient measures 4.2 mmHg.   7. There is mild (Grade II) plaque involving the aortic arch and  descending aorta.   8. Agitated saline contrast bubble study was  negative, with no evidence  of any interatrial shunt.   9. 3D performed of the LAA and demonstrates no LAA Thrombus.   Cath 2016  Prox LAD to Mid LAD lesion, 10% stenosed.  The left ventricular systolic function is normal.     Neuro/Psych negative neurological ROS  negative psych ROS   GI/Hepatic Neg liver ROS,GERD  Controlled,,UGIB: presented w/ multiple episodes of hematemesis and hematochezia   Endo/Other  diabetes, Well Controlled, Type 2, Oral Hypoglycemic Agents  Lokelma  given for K 5.8 this AM FS 150 this AM  Renal/GU CRFRenal diseaseRenal transplant 2018, CKD 3  negative genitourinary   Musculoskeletal negative musculoskeletal ROS (+)    Abdominal  (+) + obese  Peds negative pediatric ROS (+)  Hematology  (+) Blood dyscrasia, anemia Hb 10-->9.5 over 4h, plt 113 Got 1 unit prbc a couple hours ago d/t symptomatic hypotension   Anesthesia Other Findings   Reproductive/Obstetrics negative OB ROS                              Anesthesia Physical Anesthesia Plan  ASA: 3 and emergent  Anesthesia Plan: General   Post-op Pain Management:    Induction: Intravenous, Rapid sequence and Cricoid pressure planned  PONV Risk Score and Plan: 2 and Ondansetron , Midazolam , Dexamethasone  and Treatment may vary due to age or medical condition  Airway Management  Planned: Oral ETT  Additional Equipment: None  Intra-op Plan:   Post-operative Plan: Extubation in OR  Informed Consent: I have reviewed the patients History and Physical, chart, labs and discussed the procedure including the risks, benefits and alternatives for the proposed anesthesia with the patient or authorized representative who has indicated his/her understanding and acceptance.     Dental advisory given  Plan Discussed with: CRNA  Anesthesia Plan Comments:          Anesthesia Quick Evaluation  "

## 2024-08-08 NOTE — Significant Event (Signed)
 Patient seen and examined this afternoon after the procedure. Was in A-fib with RVR at around 130 BPM with blood pressure in 80s and 90s. While in PACU, he was initiated on Neo-Synephrine through peripheral IV line 1 unit of Normal Saline bolus ordered Plan to continue Neo-Synephrine and gradually wean down. Home meds include amiodarone  200 g daily and metoprolol  tartrate 100 twice daily I have resumed amiodarone  200 mg daily.  Also resume metoprolol  tartrate at a low dose of 25 mg twice daily.  If blood pressure continues to remain low, will stop metoprolol . Repeat CBC showed hemoglobin low at 8.  Will repeat another CBC at 8 PM. Urine output improving. Continue to monitor BMP Discussed with critical care Dr. Theophilus

## 2024-08-08 NOTE — Consult Note (Signed)
 Jon [name], Jon [name] is a 69 y.o. male with multiple medical problems including diabetes mellitus, end-stage renal disease status post renal transplantation, hypertension, hyperlipidemia, remote myocardial infarction, and atrial fibrillation on Eliquis .  Patient presented to the hospital with abdominal pain and multiple episodes of hematemesis.  He has been admitted to the ICU.  He was given Kcentra .  Hemoglobin has dropped from 13.9-9.5.  He had low blood pressures.  Has received a unit of packed red blood cells.  Mild hyperkalemia.  Patient is fatigued.  Still complains of some epigastric discomfort.  Is not clear if he takes NSAIDs.  He does report a remote history of ulcer disease. Prior GI procedures include EGD with dilation of esophageal stricture in 2011, normal EGD 2013, normal colonoscopy 2011, incomplete colonoscopy with left-sided diverticulosis 2013.  BUN is 50 and creatinine 2.16.  INR was 1.4, prior to Kcentra .  REVIEW OF SYSTEMS:  All non-GI ROS negative. Past Medical History:  Diagnosis Date   Chronic kidney disease    Dialysis patient    Dyspnea    GERD (gastroesophageal reflux disease)    History of blood transfusion    Hyperlipidemia    takes Atorvastatin  daily   Hypertension    takes Amlodipine ,Losartan ,and Labetalol  daily   Myocardial infarction [facility])    lived in [place]  maybe 7 years ago   Small bowel obstruction [facility])     Past Surgical History:  Procedure Laterality Date   ARTERIOVENOUS GRAFT PLACEMENT     AV FISTULA PLACEMENT Right 10/25/2015   Procedure: ARTERIOVENOUS (AV) FISTULA CREATION RIGHT FOREARM;  Surgeon: Lonni GORMAN Blade, MD;  Location: [facility] OR;  Service: Vascular;  Laterality: Right;   BOTOX  INJECTION N/A 08/07/2021   Procedure: cystoscopy BOTOX  INJECTION;   Surgeon: Carolee Sherwood [name] DOUGLAS, MD;  Location: [facility] ORS;  Service: Urology;  Laterality: N/A;   CARDIAC CATHETERIZATION N/A 05/05/2015   Procedure: Left Heart Cath and Coronary Angiography;  Surgeon: Rober Chroman, MD;  Location: [facility] INVASIVE CV LAB;  Service: Cardiovascular;  Laterality: N/A;   CARDIOVERSION N/A 04/16/2024   Procedure: CARDIOVERSION;  Surgeon: Loni Soyla LABOR, MD;  Location: [facility] INVASIVE CV LAB;  Service: Cardiovascular;  Laterality: N/A;   LIGATION OF ARTERIOVENOUS  FISTULA Left 01/06/2019   Procedure: EXCISION OF ANEURYSMAL  ARTERIOVENOUS  FISTULA LEFT ARM;  Surgeon: Blade Lonni GORMAN, MD;  Location: [facility] OR;  Service: Vascular;  Laterality: Left;   PERIPHERAL VASCULAR CATHETERIZATION N/A 08/18/2015   Procedure: Fistulagram;  Surgeon: Redell LITTIE Door, MD;  Location: [facility] INVASIVE CV LAB;  Service: Cardiovascular;  Laterality: N/A;   PERIPHERAL VASCULAR CATHETERIZATION Right 04/19/2016   Procedure: Fistulagram;  Surgeon: Redell LITTIE Door, MD;  Location: [facility] INVASIVE CV LAB;  Service: Cardiovascular;  Laterality: Right;   RESECTION OF ARTERIOVENOUS FISTULA ANEURYSM Left 02/05/2013   Procedure: REPAIR OF ANEURYSM OF LEFT ARM ARTERIOVENOUS FISTULA ;  Surgeon: Gaile LELON New, MD;  Location: [facility] OR;  Service: Vascular;  Laterality: Left;   RESECTION OF ARTERIOVENOUS FISTULA ANEURYSM Left 03/08/2015   Procedure: REPAIR OF LEFT ARTERIOVENOUS FISTULA PSEUDOANEURYSM;  Surgeon: Lonni GORMAN Blade, MD;  Location: [facility] OR;  Service: Vascular;  Laterality: Left;   REVISON OF ARTERIOVENOUS FISTULA Left 06/02/2015   Procedure: PLICATION OF A LARGE ANEURYSM LEFT Jon ARM BRACHIO-CEPHALIC ARTERIOVENOUS FISTULA ;  Surgeon: Lonni GORMAN Blade, MD;  Location: [facility] OR;  Service: Vascular;  Laterality: Left;   SMALL INTESTINE SURGERY     TRANSESOPHAGEAL ECHOCARDIOGRAM (CATH LAB) N/A 04/16/2024   Procedure: TRANSESOPHAGEAL ECHOCARDIOGRAM;  Surgeon: Loni Soyla LABOR, MD;  Location: [facility] INVASIVE CV LAB;  Service:  Cardiovascular;  Laterality: N/A;    Social History Jon [name]  reports that he has never smoked. He has never used smokeless tobacco. He reports that he does not drink alcohol and does not use drugs.  family history includes Aneurysm in his mother; Diabetes in his father; Heart attack in his father; Hypertension in his father, mother, and sister; Other in his brother, mother, and sister.  Allergies[1]     PHYSICAL EXAMINATION: Vital signs: BP (P) 124/70   Pulse 76   Temp 98.1 F (36.7 C) (Temporal)   Resp 14   Ht 6' (1.829 m)   Wt 106 kg   SpO2 100%   BMI 31.69 kg/m   Constitutional: Ill-appearing and weak, no acute distress Psychiatric: Fatigued and weak but oriented x3, cooperative Eyes: extraocular movements intact, anicteric, conjunctiva pink Mouth: oral pharynx moist, no lesions Neck: supple no lymphadenopathy Cardiovascular: heart regular rate and rhythm, no murmur Lungs: clear to auscultation bilaterally Abdomen: soft, mild epigastric tenderness to palpation, nondistended, no obvious ascites, no peritoneal signs, normal bowel sounds, no organomegaly Rectal: Melena Extremities: no clubbing, cyanosis, or lower extremity edema bilaterally Skin: no lesions on visible extremities Neuro: No focal deficits.  Cranial nerves intact  ASSESSMENT:  1.  Acute Jon GI bleed and epigastric pain.  Rule out ulcer disease. 2.  Acute blood loss anemia 3.  Atrial fibrillation on Eliquis  status post treatment with Kcentra  4.  Status post renal transplantation 5.  Multiple other general medical problems   PLAN:  1.  Recessive care, as has been provided 2.  IV PPI 3.  Emergent endoscopy.  He will need to be intubated given his repeated vomiting of blood.  He is high risk given his comorbidities and current state of health.The nature of the procedure, as well as the risks, benefits, and alternatives were carefully and thoroughly reviewed with the patient. Ample time for discussion  and questions allowed. The patient understood, was satisfied, and agreed to proceed. 4.  Further recommendations after the above A total time of 80 minutes was spent preparing to see the patient, obtaining comprehensive history, performing medically appropriate physical exam, counseling and educating the patient regarding the above listed issues, ordering emergent endoscopy, and documenting clinical information in the health record.  The degree of decision making in this case was deemed highest complexity.        [1] No Known Allergies

## 2024-08-08 NOTE — Consult Note (Signed)
" ° °  NAMESteele [name], MRN:  [id number], DOB:  01/22/1956, LOS: 1 ADMISSION DATE:  08/07/2024, CONSULTATION DATE: 08/08/2024 REFERRING MD: WENDI Rota, MD, CHIEF COMPLAINT: Shock  History of Present Illness:   69 year old with renal transplant 2018, nonischemic cardiomyopathy with severe LVH, PAF on Eliquis , hypertension presenting with hematemesis, upper GI bleed.  Underwent upper EGD showing a Mallory-Weiss tear, no active bleeding.  Injected with epinephrine  and hemostatic clips placed.  Patient was hypotensive prior to procedure due to hemorrhagic shock.  Postprocedure he was placed on Neo-Synephrine and brought back up to ICU PCCM consulted for help with management  Pertinent  Medical History    has a past medical history of Chronic kidney disease, Dialysis patient, Dyspnea, GERD (gastroesophageal reflux disease), History of blood transfusion, Hyperlipidemia, Hypertension, Myocardial infarction (HCC), and Small bowel obstruction (HCC).   Significant Hospital Events: Including procedures, antibiotic start and stop dates in addition to other pertinent events     Interim History / Subjective:     Objective    Blood pressure 104/67, pulse (!) 123, temperature 98 F (36.7 C), temperature source Temporal, resp. rate 18, height 6' (1.829 m), weight 106 kg, SpO2 99%.        Intake/Output Summary (Last 24 hours) at 08/08/2024 1537 Last data filed at 08/08/2024 1507 Gross per 24 hour  Intake 2377.5 ml  Output 130 ml  Net 2247.5 ml   Filed Weights   08/07/24 1800 08/07/24 2200  Weight: 112 kg 106 kg    Examination: Somnolent, arousable Lungs are clear to auscultation Heart rate regular rate and rhythm Abdomen soft, positive bowel sounds Extremities with no  Lab/imaging reviewed Significant for potassium 5.8, BUN/creatinine 15/2.16 Hemoglobin 9.5, platelets 113  Resolved problem list   Assessment and Plan  Shock Likely combination of hemorrhagic shock due to upper GI bleed  and anesthesia effect On peripheral Neo-Synephrine which is weaning down 1 L normal saline bolus Monitor CBC and transfuse as needed   Critical care time:    The patient is critically ill with multiple organ system failure and requires high complexity decision making for assessment and support, frequent evaluation and titration of therapies, advanced monitoring, review of radiographic studies and interpretation of complex data.   Critical Care Time devoted to patient care services, exclusive of separately billable procedures, described in this note is 35 minutes.   Carlinda Ohlson MD [facility] See Amion for pager  If no response to pager , please call [phone] until 7pm After 7:00 pm call Elink  [phone] 08/08/2024, 3:53 PM        "

## 2024-08-08 NOTE — Op Note (Signed)
 [facility] Patient Name: Jon [name] Procedure Date: 08/08/2024 MRN: [id number] Attending MD: Norleen SAILOR. Abran , MD, [id number] Date of Birth: 1956-04-29 CSN: [id number] Age: 69 Admit Type: Inpatient Procedure:                Upper GI endoscopy with endoscopic hemostatic                            therapy (epi injection, clips) Indications:              Hematemesis, Melena Providers:                Norleen SAILOR. Abran, MD, Robie Breed, RN,                            Haskel Chris, Technician Referring MD:             [facility] Medicines:                General anesthesia. Intubated for the procedure Complications:            No immediate complications. Estimated Blood Loss:     Estimated blood loss: none. Procedure:                Pre-Anesthesia Assessment:                           - Prior to the procedure, a History and Physical                            was performed, and patient medications and                            allergies were reviewed. The patient's tolerance of                            previous anesthesia was also reviewed. The risks                            and benefits of the procedure and the sedation                            options and risks were discussed with the patient.                            All questions were answered, and informed consent                            was obtained. Prior Anticoagulants: The patient has                            taken Eliquis  (apixaban ), last dose was 1 day prior                            to procedure. ASA Grade Assessment: III - A patient  with severe systemic disease. After reviewing the                            risks and benefits, the patient was deemed in                            satisfactory condition to undergo the procedure.                           After obtaining informed consent, the endoscope was                            passed under direct  vision. Throughout the                            procedure, the patient's blood pressure, pulse, and                            oxygen saturations were monitored continuously. The                            [id number]-H190 ([id number]) Olympus endoscope was introduced                            through the mouth, and advanced to the second part                            of duodenum. The upper GI endoscopy was                            accomplished without difficulty. The patient                            tolerated the procedure well. Scope In: Scope Out: Findings:      A Mallory-Weiss tear was found was found at the gastroesophageal       junction. No active bleeding but obvious stigmata of pigmented       protuberance. The lesion was successfully injected with 2 mL of a       1-10,000 solution of epinephrine  for hemostasis. Thereafter, 3       hemostatic clips were successfully placed across the defect. An       additional hemostatic clip did not adhere. There was no bleeding at the       end of the procedure.      The esophagus was otherwise normal.      The stomach was normal except for hiatal hernia.      The examined duodenum revealed pseudomelanosis but was otherwise normal.      The cardia and gastric fundus were otherwise normal on retroflexion. Impression:               - Mallory-Weiss tear at gastroesophageal junction.                            Injected. Clipped. Hemostasis maintained.                           -  Normal esophagus otherwise.                           - Normal stomach except for hiatal hernia.                           - Normal examined duodenum with incidental                            pseudomelanosis.                           - No specimens collected. Moderate Sedation:      none Recommendation:           1. Return to ICU                           2. Clear liquid diet                           3. Provide antinausea medicine as needed.                             Mallory-Weiss tears are caused by violent retching                            with bleeding generally from the left gastric                            artery. Is important that this patient not vomit.                           4. Observe for any evidence of recurrent bleeding                           . Procedure Code(s):        --- Professional ---                           [phone], Esophagogastroduodenoscopy, flexible,                            transoral; with control of bleeding, any method Diagnosis Code(s):        --- Professional ---                           K22.6, Gastro-esophageal laceration-hemorrhage                            syndrome                           K92.0, Hematemesis                           K92.1, Melena (includes Hematochezia) CPT copyright 2022 [employer]. All rights reserved. The codes documented in this report are preliminary and upon coder review may  be revised to meet current compliance requirements. Norleen SAILOR. Abran, MD 08/08/2024 1:43:04 PM This report has been signed electronically. Number of Addenda: 0

## 2024-08-08 NOTE — Progress Notes (Signed)
 Called Dr. Arlice to inform of AM x-large bloody/black BM and patient symptomatic being diaphoretic, low BP, weak/fatigued, hgb dropped to 10.   See new orders. Awaiting CBC results prior to administration of 1u RBC.

## 2024-08-08 NOTE — Transfer of Care (Signed)
 Immediate Anesthesia Transfer of Care Note  Patient: Jon [name]  Procedure(s) Performed: EGD (ESOPHAGOGASTRODUODENOSCOPY) SCLEROTHERAPY CONTROL OF HEMORRHAGE, GI TRACT, ENDOSCOPIC, BY CLIPPING OR OVERSEWING  Patient Location: PACU  Anesthesia Type:General  Level of Consciousness: drowsy and patient cooperative  Airway & Oxygen Therapy: Patient Spontanous Breathing and Patient connected to face mask oxygen  Post-op Assessment: see Dr.Finucane's note  Post vital signs: Reviewed and unstable  Last Vitals:  Vitals Value Taken Time  BP 127/75 08/08/24 14:00  Temp 36.7 C 08/08/24 13:44  Pulse 114 08/08/24 14:02  Resp 19 08/08/24 14:02  SpO2 100 % 08/08/24 14:02  Vitals shown include unfiled device data.  Last Pain:  Vitals:   08/08/24 1400  TempSrc:   PainSc: Asleep         Complications: No notable events documented.

## 2024-08-08 NOTE — Progress Notes (Signed)
" ° ° ° °  Patient Name: Jon [name]           DOB: 09-17-1955  MRN: [id number]      Admission Date: 08/07/2024  Attending Provider: Waddell Rake, MD  Primary Diagnosis: Upper GI bleed   Level of care: Stepdown   OVERNIGHT EVENT   Acute blood loss anemia related to upper GI bleed Patient had 2 large BMs described as dark red .  During his second episode, he became symptomatic--diaphoretic, hypotensive.  Also had an episode of hematemesis.  Plan: STAT H&H  Will prepare 1 unit PRBC 500 cc LR bolus for symptomatic hypotension.   Jon Vorhees, DNP, ACNPC- AG [facility] [place]    "

## 2024-08-09 ENCOUNTER — Encounter (HOSPITAL_COMMUNITY): Payer: Self-pay | Admitting: Internal Medicine

## 2024-08-09 DIAGNOSIS — K922 Gastrointestinal hemorrhage, unspecified: Secondary | ICD-10-CM | POA: Diagnosis not present

## 2024-08-09 DIAGNOSIS — K226 Gastro-esophageal laceration-hemorrhage syndrome: Secondary | ICD-10-CM | POA: Diagnosis not present

## 2024-08-09 LAB — CBC WITH DIFFERENTIAL/PLATELET
Abs Immature Granulocytes: 0.04 K/uL (ref 0.00–0.07)
Basophils Absolute: 0 K/uL (ref 0.0–0.1)
Basophils Relative: 0 %
Eosinophils Absolute: 0 K/uL (ref 0.0–0.5)
Eosinophils Relative: 0 %
HCT: 27.4 % — ABNORMAL LOW (ref 39.0–52.0)
Hemoglobin: 8.6 g/dL — ABNORMAL LOW (ref 13.0–17.0)
Immature Granulocytes: 1 %
Lymphocytes Relative: 15 %
Lymphs Abs: 1.2 K/uL (ref 0.7–4.0)
MCH: 29.1 pg (ref 26.0–34.0)
MCHC: 31.4 g/dL (ref 30.0–36.0)
MCV: 92.6 fL (ref 80.0–100.0)
Monocytes Absolute: 0.8 K/uL (ref 0.1–1.0)
Monocytes Relative: 10 %
Neutro Abs: 5.8 K/uL (ref 1.7–7.7)
Neutrophils Relative %: 74 %
Platelets: 80 K/uL — ABNORMAL LOW (ref 150–400)
RBC: 2.96 MIL/uL — ABNORMAL LOW (ref 4.22–5.81)
RDW: 15.7 % — ABNORMAL HIGH (ref 11.5–15.5)
WBC: 8 K/uL (ref 4.0–10.5)
nRBC: 0.3 % — ABNORMAL HIGH (ref 0.0–0.2)

## 2024-08-09 LAB — BASIC METABOLIC PANEL WITH GFR
Anion gap: 9 (ref 5–15)
BUN: 41 mg/dL — ABNORMAL HIGH (ref 8–23)
CO2: 19 mmol/L — ABNORMAL LOW (ref 22–32)
Calcium: 8.9 mg/dL (ref 8.9–10.3)
Chloride: 110 mmol/L (ref 98–111)
Creatinine, Ser: 1.95 mg/dL — ABNORMAL HIGH (ref 0.61–1.24)
GFR, Estimated: 37 mL/min — ABNORMAL LOW
Glucose, Bld: 123 mg/dL — ABNORMAL HIGH (ref 70–99)
Potassium: 5.5 mmol/L — ABNORMAL HIGH (ref 3.5–5.1)
Sodium: 138 mmol/L (ref 135–145)

## 2024-08-09 MED ORDER — SODIUM ZIRCONIUM CYCLOSILICATE 10 G PO PACK
10.0000 g | PACK | Freq: Once | ORAL | Status: AC
  Administered 2024-08-09: 10 g via ORAL
  Filled 2024-08-09: qty 1

## 2024-08-09 NOTE — Plan of Care (Signed)

## 2024-08-09 NOTE — Progress Notes (Signed)
 HISTORY OF PRESENT ILLNESS:  Jon [name] is a 69 y.o. male admitted to the hospital with large-volume hematemesis and presyncope.  Found to have Mallory-Weiss tear on endoscopy performed yesterday.  Treated.  Has been stable without evidence of recurrent bleeding.  Now out of ICU and onto the floor.  Patient tells me that he is fatigued but without other complaints.  I reviewed his endoscopy findings and plans.  Laboratories: Hemoglobin 8.6 BUN 41, creatinine 1.95  REVIEW OF SYSTEMS:  All non-GI ROS negative. Past Medical History:  Diagnosis Date   Chronic kidney disease    Dialysis patient    Dyspnea    GERD (gastroesophageal reflux disease)    History of blood transfusion    Hyperlipidemia    takes Atorvastatin  daily   Hypertension    takes Amlodipine ,Losartan ,and Labetalol  daily   Myocardial infarction [facility])    lived in [place]  maybe 7 years ago   Small bowel obstruction [facility])     Past Surgical History:  Procedure Laterality Date   ARTERIOVENOUS GRAFT PLACEMENT     AV FISTULA PLACEMENT Right 10/25/2015   Procedure: ARTERIOVENOUS (AV) FISTULA CREATION RIGHT FOREARM;  Surgeon: Lonni GORMAN Blade, MD;  Location: [facility] OR;  Service: Vascular;  Laterality: Right;   BOTOX  INJECTION N/A 08/07/2021   Procedure: cystoscopy BOTOX  INJECTION;  Surgeon: Carolee Sherwood [name] DOUGLAS, MD;  Location: [facility] ORS;  Service: Urology;  Laterality: N/A;   CARDIAC CATHETERIZATION N/A 05/05/2015   Procedure: Left Heart Cath and Coronary Angiography;  Surgeon: Rober Chroman, MD;  Location: [facility] INVASIVE CV LAB;  Service: Cardiovascular;  Laterality: N/A;   CARDIOVERSION N/A 04/16/2024   Procedure: CARDIOVERSION;  Surgeon: Loni Soyla LABOR, MD;  Location: [facility] INVASIVE CV LAB;  Service: Cardiovascular;  Laterality: N/A;   ESOPHAGOGASTRODUODENOSCOPY N/A 08/08/2024   Procedure: EGD (ESOPHAGOGASTRODUODENOSCOPY);  Surgeon: Abran Norleen SAILOR, MD;  Location: [facility];  Service: Gastroenterology;  Laterality: N/A;    HEMOSTASIS CLIP PLACEMENT  08/08/2024   Procedure: CONTROL OF HEMORRHAGE, GI TRACT, ENDOSCOPIC, BY CLIPPING OR OVERSEWING;  Surgeon: Abran Norleen SAILOR, MD;  Location: [facility];  Service: Gastroenterology;;   LIGATION OF ARTERIOVENOUS  FISTULA Left 01/06/2019   Procedure: EXCISION OF ANEURYSMAL  ARTERIOVENOUS  FISTULA LEFT ARM;  Surgeon: Blade Lonni GORMAN, MD;  Location: [facility] OR;  Service: Vascular;  Laterality: Left;   PERIPHERAL VASCULAR CATHETERIZATION N/A 08/18/2015   Procedure: Fistulagram;  Surgeon: Redell LITTIE Door, MD;  Location: [facility] INVASIVE CV LAB;  Service: Cardiovascular;  Laterality: N/A;   PERIPHERAL VASCULAR CATHETERIZATION Right 04/19/2016   Procedure: Fistulagram;  Surgeon: Redell LITTIE Door, MD;  Location: [facility] INVASIVE CV LAB;  Service: Cardiovascular;  Laterality: Right;   RESECTION OF ARTERIOVENOUS FISTULA ANEURYSM Left 02/05/2013   Procedure: REPAIR OF ANEURYSM OF LEFT ARM ARTERIOVENOUS FISTULA ;  Surgeon: Gaile LELON New, MD;  Location: [facility] OR;  Service: Vascular;  Laterality: Left;   RESECTION OF ARTERIOVENOUS FISTULA ANEURYSM Left 03/08/2015   Procedure: REPAIR OF LEFT ARTERIOVENOUS FISTULA PSEUDOANEURYSM;  Surgeon: Lonni GORMAN Blade, MD;  Location: [facility] OR;  Service: Vascular;  Laterality: Left;   REVISON OF ARTERIOVENOUS FISTULA Left 06/02/2015   Procedure: PLICATION OF A LARGE ANEURYSM LEFT UPPER ARM BRACHIO-CEPHALIC ARTERIOVENOUS FISTULA ;  Surgeon: Lonni GORMAN Blade, MD;  Location: [facility] OR;  Service: Vascular;  Laterality: Left;   SCLEROTHERAPY  08/08/2024   Procedure: MATIAS;  Surgeon: Abran Norleen SAILOR, MD;  Location: [facility];  Service: Gastroenterology;;   SMALL INTESTINE SURGERY     TRANSESOPHAGEAL ECHOCARDIOGRAM (CATH  LAB) N/A 04/16/2024   Procedure: TRANSESOPHAGEAL ECHOCARDIOGRAM;  Surgeon: Loni Soyla LABOR, MD;  Location: [facility] INVASIVE CV LAB;  Service: Cardiovascular;  Laterality: N/A;    Social History Kadrian Fedak  reports that he has never smoked. He has never used  smokeless tobacco. He reports that he does not drink alcohol and does not use drugs.  family history includes Aneurysm in his mother; Diabetes in his father; Heart attack in his father; Hypertension in his father, mother, and sister; Other in his brother, mother, and sister.  Allergies[1]     PHYSICAL EXAMINATION: Vital signs: BP 126/79 (BP Location: Right Arm)   Pulse 65   Temp 98.5 F (36.9 C) (Oral)   Resp 16   Ht 6' (1.829 m)   Wt 106 kg   SpO2 100%   BMI 31.69 kg/m  General: Well-developed, well-nourished, no acute distress HEENT: Sclerae are anicteric, conjunctiva pink. Oral mucosa intact Lungs: Clear Heart: Regular Abdomen: soft, nontender, nondistended, no obvious ascites, no peritoneal signs, normal bowel sounds. No organomegaly. Extremities: No edema Psychiatric: alert and oriented x3. Cooperative   ASSESSMENT:  1.  Acute GI bleed secondary to Mallory-Weiss tear status post endoscopic hemostatic therapy.  Stable without evidence of ongoing bleeding   PLAN:  1.  Full liquid diet today.  If doing well tomorrow, regular diet. 2.  Convert IV PPI to p.o. pantoprazole  40 mg daily.  He should stay on this daily for his GERD. 3.  Given the magnitude of the bleeding, he should be monitored for at least an additional 48 hours.  If doing well at that point he can be discharged.  He does not need outpatient GI follow-up for this problem, but should follow-up with his PCP to monitor his blood counts. Please contact us  for any questions or problems.  Will sign off.  Thanks         [1] No Known Allergies

## 2024-08-09 NOTE — Progress Notes (Signed)
" °   08/09/24 1541  TOC Brief Assessment  Insurance and Status Reviewed  Patient has primary care physician Yes  Home environment has been reviewed single family home  Prior level of function: independent  Prior/Current Home Services No current home services  Social Drivers of Health Review SDOH reviewed no interventions necessary  Readmission risk has been reviewed Yes  Transition of care needs no transition of care needs at this time    Signed: Heather Saltness, MSW, LCSW Clinical Social Worker Inpatient Care Management 08/09/2024 3:42 PM   "

## 2024-08-09 NOTE — Evaluation (Signed)
 Clinical/Bedside Swallow Evaluation Patient Details  Name: Jon [name] MRN: [id number] Date of Birth: April 14, 1956  Today's Date: 08/09/2024 Time: SLP Start Time (ACUTE ONLY): 0858 SLP Stop Time (ACUTE ONLY): 0908 SLP Time Calculation (min) (ACUTE ONLY): 10 min  Past Medical History:  Past Medical History:  Diagnosis Date   Chronic kidney disease    Dialysis patient    Dyspnea    GERD (gastroesophageal reflux disease)    History of blood transfusion    Hyperlipidemia    takes Atorvastatin  daily   Hypertension    takes Amlodipine ,Losartan ,and Labetalol  daily   Myocardial infarction (HCC)    lived in [place]  maybe 7 years ago   Small bowel obstruction (HCC)    Past Surgical History:  Past Surgical History:  Procedure Laterality Date   ARTERIOVENOUS GRAFT PLACEMENT     AV FISTULA PLACEMENT Right 10/25/2015   Procedure: ARTERIOVENOUS (AV) FISTULA CREATION RIGHT FOREARM;  Surgeon: Lonni GORMAN Blade, MD;  Location: [facility] OR;  Service: Vascular;  Laterality: Right;   BOTOX  INJECTION N/A 08/07/2021   Procedure: cystoscopy BOTOX  INJECTION;  Surgeon: Carolee Sherwood [name] DOUGLAS, MD;  Location: [facility];  Service: Urology;  Laterality: N/A;   CARDIAC CATHETERIZATION N/A 05/05/2015   Procedure: Left Heart Cath and Coronary Angiography;  Surgeon: Rober Chroman, MD;  Location: [facility] INVASIVE CV LAB;  Service: Cardiovascular;  Laterality: N/A;   CARDIOVERSION N/A 04/16/2024   Procedure: CARDIOVERSION;  Surgeon: Loni Soyla LABOR, MD;  Location: [facility] INVASIVE CV LAB;  Service: Cardiovascular;  Laterality: N/A;   ESOPHAGOGASTRODUODENOSCOPY N/A 08/08/2024   Procedure: EGD (ESOPHAGOGASTRODUODENOSCOPY);  Surgeon: Abran Norleen SAILOR, MD;  Location: [facility];  Service: Gastroenterology;  Laterality: N/A;   HEMOSTASIS CLIP PLACEMENT  08/08/2024   Procedure: CONTROL OF HEMORRHAGE, GI TRACT, ENDOSCOPIC, BY CLIPPING OR OVERSEWING;  Surgeon: Abran Norleen SAILOR, MD;  Location: [facility];  Service: Gastroenterology;;   LIGATION OF  ARTERIOVENOUS  FISTULA Left 01/06/2019   Procedure: EXCISION OF ANEURYSMAL  ARTERIOVENOUS  FISTULA LEFT ARM;  Surgeon: Blade Lonni GORMAN, MD;  Location: [facility] OR;  Service: Vascular;  Laterality: Left;   PERIPHERAL VASCULAR CATHETERIZATION N/A 08/18/2015   Procedure: Fistulagram;  Surgeon: Redell LITTIE Door, MD;  Location: [facility] INVASIVE CV LAB;  Service: Cardiovascular;  Laterality: N/A;   PERIPHERAL VASCULAR CATHETERIZATION Right 04/19/2016   Procedure: Fistulagram;  Surgeon: Redell LITTIE Door, MD;  Location: [facility] INVASIVE CV LAB;  Service: Cardiovascular;  Laterality: Right;   RESECTION OF ARTERIOVENOUS FISTULA ANEURYSM Left 02/05/2013   Procedure: REPAIR OF ANEURYSM OF LEFT ARM ARTERIOVENOUS FISTULA ;  Surgeon: Gaile LELON New, MD;  Location: [facility] OR;  Service: Vascular;  Laterality: Left;   RESECTION OF ARTERIOVENOUS FISTULA ANEURYSM Left 03/08/2015   Procedure: REPAIR OF LEFT ARTERIOVENOUS FISTULA PSEUDOANEURYSM;  Surgeon: Lonni GORMAN Blade, MD;  Location: [facility] OR;  Service: Vascular;  Laterality: Left;   REVISON OF ARTERIOVENOUS FISTULA Left 06/02/2015   Procedure: PLICATION OF A LARGE ANEURYSM LEFT UPPER ARM BRACHIO-CEPHALIC ARTERIOVENOUS FISTULA ;  Surgeon: Lonni GORMAN Blade, MD;  Location: [facility] OR;  Service: Vascular;  Laterality: Left;   SCLEROTHERAPY  08/08/2024   Procedure: MATIAS;  Surgeon: Abran Norleen SAILOR, MD;  Location: [facility];  Service: Gastroenterology;;   SMALL INTESTINE SURGERY     TRANSESOPHAGEAL ECHOCARDIOGRAM (CATH LAB) N/A 04/16/2024   Procedure: TRANSESOPHAGEAL ECHOCARDIOGRAM;  Surgeon: Loni Soyla LABOR, MD;  Location: [facility] INVASIVE CV LAB;  Service: Cardiovascular;  Laterality: N/A;   HPI:  Jon [name] is a 69 y.o. male who presented to  the ED on 08/07/2024 with hematemesis, upper GI bleed. EGD showed a Mallory-Weiss tear, no active bleeding; he was injected with epinephrine  and hemastatic clips placed, GI recommending clear liquids diet. SLP swallow evaluation ordered secondary to  patient reportig trouble swallowing food/pills at home. PMH: GERD, CKD, dyspnea, HTN, HLD, MI, SBC, dialysis patient. Patient told SLP that he has been only able to take liquid medications or crushed (or he chews) since 2018. He added that he chews his food very fine.    Assessment / Plan / Recommendation  Clinical Impression  No f/u warranted from SLP at this time; PO's per GI recs.  Patient is not currently presenting with clinical s/s of oropharyngeal dysphagia as per this bedside swallow evaluation. Based on patient's report and chart review, his primary dysphagia is esophageal in nature. He has been taking only liquid or crushed medications since 2018 per his report. He is being followed by GI, s/p EGD with hemostatic clipping of Mallory-Weiss tear found at that GE-junction.   SLP Visit Diagnosis: Dysphagia, unspecified (R13.10)    Aspiration Risk  No limitations    Diet Recommendation Other (Comment) (as per GI recommendations)    Medication Administration: Other (Comment) (as tolerated, liquid form or crushed in puree)    Other Recommendations   N/A    Swallow Evaluation Recommendations  N/A   Assistance Recommended at Discharge    Functional Status Assessment Patient has not had a recent decline in their functional status  Frequency and Duration            Prognosis        Swallow Study   General Date of Onset: 08/08/24 HPI: Jon [name] is a 69 y.o. male who presented to the ED on 08/07/2024 with hematemesis, upper GI bleed. EGD showed a Mallory-Weiss tear, no active bleeding; he was injected with epinephrine  and hemastatic clips placed, GI recommending clear liquids diet. SLP swallow evaluation ordered secondary to patient reportig trouble swallowing food/pills at home. PMH: GERD, CKD, dyspnea, HTN, HLD, MI, SBC, dialysis patient. Patient told SLP that he has been only able to take liquid medications or crushed (or he chews) since 2018. He added that he chews his food very  fine. Type of Study: Bedside Swallow Evaluation Previous Swallow Assessment: none found Diet Prior to this Study: Thin liquids (Level 0);Clear liquid diet Temperature Spikes Noted: No Respiratory Status: Room air History of Recent Intubation: Yes Total duration of intubation (days):  (for EGD only) Date extubated:  (extubated after EGD) Behavior/Cognition: Alert;Cooperative;Pleasant mood Oral Cavity Assessment: Within Functional Limits Oral Care Completed by SLP: No Oral Cavity - Dentition: Adequate natural dentition Vision: Functional for self-feeding Self-Feeding Abilities: Able to feed self Patient Positioning: Partially reclined Baseline Vocal Quality: Normal Volitional Swallow: Able to elicit    Oral/Motor/Sensory Function Overall Oral Motor/Sensory Function: Within functional limits   Ice Chips     Thin Liquid Thin Liquid: Within functional limits Presentation: Straw;Self Fed    Nectar Thick     Honey Thick     Puree Puree: Within functional limits Presentation: Spoon   Solid     Solid: Not tested      Norleen IVAR Blase, MA, CCC-SLP Speech Therapy  08/09/2024,9:58 AM

## 2024-08-09 NOTE — Progress Notes (Signed)
 " PROGRESS NOTE  Jon [name]  DOB: Aug 07, 1955  PCP: [facility] FMW:[id number]  DOA: 08/07/2024  LOS: 2 days  Hospital Day: 3  Subjective: Patient was seen and examined this morning. Lying down in bed.  Not in distress.  Seen by speech therapy today.  No issues noted. No family at bedside Overnight, afebrile, hemodynamically stable, breathing room air Most recent labs from this morning at 2:30 AM showed hemoglobin of 8.6, platelet 80, potassium 5.5, BUN/creatinine 41/95  Brief narrative: Jon [name] is a 69 y.o. male with PMH significant for Cadaver renal transplantation 2018, transplant CKD, NICM with severe LVH, PAF on Eliquis , HTN. 1/9, patient presented to the ED after an episode of vomiting of blood.  Reported few episodes of diarrhea as well.  Has no prior history of GI bleeding, does not drink alcohol, does not use NSAIDs  While in the ED he had 2 more episodes of hematemesis.  Blood pressure initially 95/63, heart rate 68, breathing on room air Initial hemoglobin 11.8, creatinine 1.71 Patient was started on Protonix  drip, given 1 dose of Kcentra , IV fluid GI consulted Admitted to [facility] Overnight, patient had 2 large dark red bowel movements and another episode of hematemesis. 1/10, EGD showed Mallory-Weiss tear at GE junction.  It was injected, clipped, hemostasis achieved.  Assessment and plan: Upper GI bleed Acute blood loss anemia Anemia of chronic disease Presented with hematemesis in the setting of Eliquis  use. Given IV fluid, IV Kcentra  and started on Protonix  drip Overnight, patient had 2 large dark red bowel movements and another episode of hematemesis.  Also became hypotensive and diaphoretic.   1/10, EGD showed Mallory-Weiss tear at GE junction.  It was injected, clipped, hemostasis achieved. Postprocedure, patient had painful swallowing.  Improved with pain medicine.  Seen by speech today.   Baseline hemoglobin above 11.  Received 1 unit of PRBC yesterday.   Hemoglobin 8.6 this morning.  No drop since EGD yesterday. Eliquis  on hold. Not on NSAIDs, does not drink alcohol. Recent Labs    08/08/24 0825 08/08/24 1320 08/08/24 1531 08/08/24 1934 08/09/24 0232  HGB 9.5* 9.5* 8.0* 7.9* 8.6*  MCV 90.1  --  89.7 93.4 92.6   Persistent nausea, retching Improving with scheduled Zofran  for 2 days Advance to full liquid diet today..  Will advance to solid diet once okay with GI.  Hypotension H/o hypertension H/o NICM with severe LVH For the first 24 hours, patient was hypotensive because of persistent bleeding.  Antihypertensives were held PTA meds- metoprolol , amlodipine , Jardiance , Lasix , Aldactone , hydralazine , Bleeding is stopped, blood pressure rising up. Currently on metoprolol  25 mg twice daily.  Paroxysmal atrial fibrillation Long-term anticoagulation PTA meds- amiodarone , metoprolol . Continue amiodarone .  Metoprolol  has been resumed at a low dose of 25 mg twice daily. Will resume Eliquis  when okay with GI.  Cadaver renal transplant status 2018  Transplant CKD 3 A Baseline creatinine less than 2.  Creatinine remains at baseline.  BUN elevated in setting of GI bleed. Continue to trend  Continue prograf  and cellcept   Follows up with nephrologist Dr. Marlee.  Recent Labs    04/15/24 1622 04/15/24 1629 04/16/24 0505 04/17/24 0438 04/18/24 0428 04/23/24 0947 08/07/24 1757 08/07/24 1803 08/08/24 0238 08/08/24 0825 08/08/24 1531 08/09/24 0232  BUN 10   < > 9 14 22 15  42* 42* 45* 50* 49* 41*  CREATININE 1.49*   < > 1.44* 1.70* 2.00* 1.74* 1.90* 1.71* 1.90* 2.16* 2.11* 1.95*  CO2 19*  --  24 23  24 18*  --  20* 20* 21* 20* 19*   < > = values in this interval not displayed.   HypERkalemia Potassium has been running on the higher side of normal mostly.  1 dose of Lokelma  was given yesterday.  Repeat Lokelma  today. Recent Labs  Lab 08/08/24 0238 08/08/24 0825 08/08/24 1320 08/08/24 1531 08/09/24 0232  K 5.6* 5.8* 5.9* 4.9  5.5*   H/o CAD Currently has no anginal symptoms  PTA meds-Eliquis , statin Continue statin.  Eliquis  plan as above.    Nutrition Status:         Mobility: Able to ambulate independently  PT Orders:   PT Follow up Rec:     Goals of care   Code Status: Full Code     DVT prophylaxis:  SCDs Start: 08/07/24 2007 Place TED hose Start: 08/07/24 2007   Antimicrobials: None Fluid: None Consultants: GI Family Communication: None at bedside  Status: Inpatient Level of care:  Telemetry   Patient is from: Home Needs to continue in-hospital care: POD 1.  Hemoglobin and blood pressure stable, plan to discharge tomorrow morning Anticipated d/c to: Home in 1 to 2 days likely     Diet:  Diet Order             Diet full liquid Room service appropriate? Yes; Fluid consistency: Thin  Diet effective now                   Scheduled Meds:  amiodarone   200 mg Oral Daily   Chlorhexidine  Gluconate Cloth  6 each Topical Daily   metoprolol  tartrate  25 mg Oral BID   mycophenolate   1,000 mg Oral BID   ondansetron  (ZOFRAN ) IV  4 mg Intravenous Q6H   pantoprazole  (PROTONIX ) IV  40 mg Intravenous Q12H   predniSONE   5 mg Oral Daily   tacrolimus   2 mg Oral QHS   tacrolimus   3 mg Oral Daily    PRN meds: acetaminophen  **OR** acetaminophen , fentaNYL  (SUBLIMAZE ) injection, metoprolol  tartrate, ondansetron  **OR** ondansetron  (ZOFRAN ) IV   Infusions:    Antimicrobials: Anti-infectives (From admission, onward)    None       Objective: Vitals:   08/09/24 1000 08/09/24 1116  BP:  126/79  Pulse: 71 65  Resp: 14 16  Temp:  98.5 F (36.9 C)  SpO2: 98% 100%    Intake/Output Summary (Last 24 hours) at 08/09/2024 1201 Last data filed at 08/09/2024 1000 Gross per 24 hour  Intake 925 ml  Output 2725 ml  Net -1800 ml   Filed Weights   08/07/24 1800 08/07/24 2200  Weight: 112 kg 106 kg   Weight change:  Body mass index is 31.69 kg/m.   Physical Exam: General  exam: Pleasant, elderly African-American male.  Not in distress Skin: No rashes, lesions or ulcers. HEENT: Atraumatic, normocephalic, no obvious bleeding Lungs: Clear to auscultation bilaterally,  CVS: S1, S2, no murmur,   GI/Abd: Soft, nontender, nondistended, bowel sound present,   CNS: Alert, awake, oriented x 3 Psychiatry: Mood appropriate Extremities: No pedal edema, no calf tenderness  Data Review: I have personally reviewed the laboratory data and studies available.  F/u labs  Unresulted Labs (From admission, onward)     Start     Ordered   Unscheduled  Basic metabolic panel with GFR  Tomorrow morning,   R       Question:  Specimen collection method  Answer:  Lab=Lab collect   08/09/24 1201   Unscheduled  CBC  with Differential/Platelet  Tomorrow morning,   R       Question:  Specimen collection method  Answer:  Lab=Lab collect   08/09/24 1201            Signed, Chapman Rota, MD [facility] 08/09/2024  "

## 2024-08-09 NOTE — Progress Notes (Signed)
" ° °  NAMECru Jon [name], Jon [name], Jon [name], LOS: 2 ADMISSION DATE:  08/07/2024, CONSULTATION DATE: 08/08/2024 REFERRING MD: WENDI Rota, MD, CHIEF COMPLAINT: Shock  History of Present Illness:   69 year old with renal transplant 2018, nonischemic cardiomyopathy with severe LVH, PAF on Eliquis , hypertension presenting with hematemesis, upper GI bleed.  Underwent upper EGD showing a Mallory-Weiss tear, no active bleeding.  Injected with epinephrine  and hemostatic clips placed.  Patient was hypotensive prior to procedure due to hemorrhagic shock.  Postprocedure he was placed on Neo-Synephrine and brought back up to ICU PCCM consulted for help with management  Pertinent  Medical History    has a past medical history of Chronic kidney disease, Dialysis patient, Dyspnea, GERD (gastroesophageal reflux disease), History of blood transfusion, Hyperlipidemia, Hypertension, Myocardial infarction (HCC), and Small bowel obstruction (HCC).   Significant Hospital Events: Including procedures, antibiotic start and stop dates in addition to other pertinent events   1/10 admitted for upper GI bleed, EGD with Mallory-Weiss tear.  Required Neo-Synephrine postprocedure   Interim History / Subjective:   Off Neo-Synephrine.  BP stable  Objective    Blood pressure (!) 149/79, pulse 66, temperature 97.7 F (36.5 C), temperature source Axillary, resp. rate 16, height 6' (1.829 m), weight 106 kg, SpO2 97%.    FiO2 (%):  [28 %] 28 %   Intake/Output Summary (Last 24 hours) at 08/09/2024 0737 Last data filed at 08/09/2024 0600 Gross per 24 hour  Intake 925 ml  Output 2525 ml  Net -1600 ml   Filed Weights   08/07/24 1800 08/07/24 2200  Weight: 112 kg 106 kg    Examination: Awake, no distress Lungs are clear to auscultation Heart rate regular rate and rhythm Abdomen soft Extremities with no edema  Lab/imaging reviewed Potassium 5.5, BUN/creatinine 41/1.95 Hemoglobin 8.6, platelets  80  Resolved problem list   Assessment and Plan  Shock Likely combination of hemorrhagic shock due to upper GI bleed and anesthesia effect Now off Neo-Synephrine Monitor CBC and transfuse as needed  PCCM will sign off.  Please call with questions.   Critical care time: NA   Lonna Coder MD [facility] See Amion for pager  If no response to pager , please call [phone] until 7pm After 7:00 pm call Elink  [phone] 08/09/2024, 7:37 AM        "

## 2024-08-10 DIAGNOSIS — K922 Gastrointestinal hemorrhage, unspecified: Secondary | ICD-10-CM | POA: Diagnosis not present

## 2024-08-10 LAB — BASIC METABOLIC PANEL WITH GFR
Anion gap: 8 (ref 5–15)
BUN: 21 mg/dL (ref 8–23)
CO2: 24 mmol/L (ref 22–32)
Calcium: 8.9 mg/dL (ref 8.9–10.3)
Chloride: 108 mmol/L (ref 98–111)
Creatinine, Ser: 1.78 mg/dL — ABNORMAL HIGH (ref 0.61–1.24)
GFR, Estimated: 42 mL/min — ABNORMAL LOW
Glucose, Bld: 119 mg/dL — ABNORMAL HIGH (ref 70–99)
Potassium: 4.3 mmol/L (ref 3.5–5.1)
Sodium: 140 mmol/L (ref 135–145)

## 2024-08-10 LAB — CBC WITH DIFFERENTIAL/PLATELET
Abs Immature Granulocytes: 0.04 K/uL (ref 0.00–0.07)
Basophils Absolute: 0 K/uL (ref 0.0–0.1)
Basophils Relative: 0 %
Eosinophils Absolute: 0.1 K/uL (ref 0.0–0.5)
Eosinophils Relative: 2 %
HCT: 22 % — ABNORMAL LOW (ref 39.0–52.0)
Hemoglobin: 6.9 g/dL — CL (ref 13.0–17.0)
Immature Granulocytes: 1 %
Lymphocytes Relative: 21 %
Lymphs Abs: 1.2 K/uL (ref 0.7–4.0)
MCH: 28.6 pg (ref 26.0–34.0)
MCHC: 31.4 g/dL (ref 30.0–36.0)
MCV: 91.3 fL (ref 80.0–100.0)
Monocytes Absolute: 0.6 K/uL (ref 0.1–1.0)
Monocytes Relative: 9 %
Neutro Abs: 4 K/uL (ref 1.7–7.7)
Neutrophils Relative %: 67 %
Platelets: 80 K/uL — ABNORMAL LOW (ref 150–400)
RBC: 2.41 MIL/uL — ABNORMAL LOW (ref 4.22–5.81)
RDW: 15.5 % (ref 11.5–15.5)
Smear Review: NORMAL
WBC: 6 K/uL (ref 4.0–10.5)
nRBC: 0.7 % — ABNORMAL HIGH (ref 0.0–0.2)

## 2024-08-10 LAB — HEMOGLOBIN AND HEMATOCRIT, BLOOD
HCT: 24.2 % — ABNORMAL LOW (ref 39.0–52.0)
Hemoglobin: 7.6 g/dL — ABNORMAL LOW (ref 13.0–17.0)

## 2024-08-10 LAB — PREPARE RBC (CROSSMATCH)

## 2024-08-10 MED ORDER — SODIUM CHLORIDE 0.9% IV SOLUTION: Freq: Once | INTRAVENOUS | Status: AC

## 2024-08-10 MED ORDER — SULFAMETHOXAZOLE-TRIMETHOPRIM 200-40 MG/5ML PO SUSP
10.0000 mL | ORAL | Status: DC
  Administered 2024-08-10 – 2024-08-12 (×2): 10 mL via ORAL
  Filled 2024-08-10 (×2): qty 10

## 2024-08-10 MED ORDER — METOPROLOL TARTRATE 50 MG PO TABS
50.0000 mg | ORAL_TABLET | Freq: Two times a day (BID) | ORAL | Status: DC
  Administered 2024-08-10: 50 mg via ORAL
  Filled 2024-08-10 (×2): qty 1

## 2024-08-10 MED ORDER — METOPROLOL TARTRATE 25 MG PO TABS
25.0000 mg | ORAL_TABLET | Freq: Once | ORAL | Status: AC
  Administered 2024-08-10: 25 mg via ORAL
  Filled 2024-08-10: qty 1

## 2024-08-10 MED ORDER — SODIUM CHLORIDE 0.9 % IV BOLUS: 1000.0000 mL | Freq: Once | INTRAVENOUS | Status: DC | PRN

## 2024-08-10 NOTE — Plan of Care (Signed)

## 2024-08-10 NOTE — Progress Notes (Signed)
 MEWS Progress Note  Patient Details Name: Jon [name] MRN: [id number] DOB: 10-12-1955 Today's Date: 08/10/2024   MEWS Flowsheet Documentation:  Assess: MEWS Score Temp: 98.4 F (36.9 C) BP: 94/83 MAP (mmHg): 89 Pulse Rate: (!) 128 ECG Heart Rate: 71 Resp: 19 Level of Consciousness: Alert SpO2: 100 % O2 Device: Room Air Patient Activity (if Appropriate): In bed O2 Flow Rate (L/min): 2 L/min FiO2 (%): 28 % Assess: MEWS Score MEWS Temp: 0 MEWS Systolic: 1 MEWS Pulse: 2 MEWS RR: 0 MEWS LOC: 0 MEWS Score: 3 MEWS Score Color: Yellow Assess: SIRS CRITERIA SIRS Temperature : 0 SIRS Respirations : 0 SIRS Pulse: 1 SIRS WBC: 0 SIRS Score Sum : 1 Assess: if the MEWS score is Yellow or Red Were vital signs accurate and taken at a resting state?: Yes Does the patient meet 2 or more of the SIRS criteria?: Yes Does the patient have a confirmed or suspected source of infection?: No MEWS guidelines implemented : Yes, yellow Treat MEWS Interventions: Considered administering scheduled or prn medications/treatments as ordered Take Vital Signs Increase Vital Sign Frequency : Yellow: Q2hr x1, continue Q4hrs until patient remains green for 12hrs Escalate MEWS: Escalate: Yellow: Discuss with charge nurse and consider notifying provider and/or RRT Notify: Charge Nurse/RN Name of Charge Nurse/RN Notified: Education Officer, Community Provider Notification Provider Name/Title: [facility] Date Provider Notified: 08/10/24 Time Provider Notified: 1354 Method of Notification: Page Notification Reason: Change in status Provider response: See new orders Date of Provider Response: 08/10/24 Time of Provider Response: 1354 Notify: Rapid Response Name of Rapid Response RN Notified: Ethan RN Date Rapid Response Notified: 08/10/24 Time Rapid Response Notified: 1354      Kiersten Coss G Darlis Wragg 08/10/2024, 1:54 PM

## 2024-08-10 NOTE — Progress Notes (Signed)
 " PROGRESS NOTE  Jon [name]  DOB: Jun 05, 1956  PCP: [facility], [facility] FMW:[id number]  DOA: 08/07/2024  LOS: 3 days  Hospital Day: 4  Subjective: Patient was seen and examined this morning. Lying on bed.  Fecal tube in place with old melanotic stool.  Patient thinks it is not draining anything since yesterday. I have asked RN to remove it. Afebrile, hemodynamically stable. Labs this morning with potassium level normalized to 4.3, creatinine improved to 1.78, hemoglobin low at 6.9.  Brief narrative: Jon [name] is a 69 y.o. male with PMH significant for Cadaver renal transplantation 2018, transplant CKD, NICM with severe LVH, PAF on Eliquis , HTN. 1/9, patient presented to the ED after an episode of vomiting of blood.  Reported few episodes of diarrhea as well.  Has no prior history of GI bleeding, does not drink alcohol, does not use NSAIDs  While in the ED he had 2 more episodes of hematemesis.  Blood pressure initially 95/63, heart rate 68, breathing on room air Initial hemoglobin 11.8, creatinine 1.71 Patient was started on Protonix  drip, given 1 dose of Kcentra , IV fluid GI consulted Admitted to [facility] Overnight, patient had 2 large dark red bowel movements and another episode of hematemesis. 1/10, EGD showed Mallory-Weiss tear at GE junction.  It was injected, clipped, hemostasis achieved.  Assessment and plan: Upper GI bleed Acute blood loss anemia Anemia of chronic disease Presented with hematemesis in the setting of Eliquis  use. Given IV fluid, IV Kcentra  and started on Protonix  drip Overnight, patient had 2 large dark red bowel movements and another episode of hematemesis.  Also became hypotensive and diaphoretic.   1/10, EGD showed Mallory-Weiss tear at GE junction.  It was injected, clipped, hemostasis achieved. Able to tolerate full liquid diet. 1/12, hemoglobin dropped again to 6.9 today.  BUN is not elevated so probably not actively bleeding.  Hopefully hemoglobin is just  trying to equilibrate.  1 more unit of PRBC transfusion ordered this morning.  This is his second unit this hospitalization. Clinically patient does not think he is bleeding anymore.  No nausea or vomiting.  Melanotic stool in the fecal bag is from yesterday he says.  Remove fecal bag.  For now, we will continue liquid diet.  Repeat labs tomorrow. Eliquis  on hold. Not on NSAIDs, does not drink alcohol. Recent Labs    08/08/24 1320 08/08/24 1531 08/08/24 1934 08/09/24 0232 08/10/24 0540  HGB 9.5* 8.0* 7.9* 8.6* 6.9*  MCV  --  89.7 93.4 92.6 91.3   Persistent nausea, retching Improving with Zofran  Continue full liquid diet for today  Hypotension H/o hypertension H/o NICM with severe LVH For the first 24 hours, patient was hypotensive because of persistent bleeding.  Antihypertensives were held PTA meds- metoprolol , amlodipine , Jardiance , Lasix , Aldactone , hydralazine , Currently blood pressure controlled only on metoprolol  25 mg twice daily.  Paroxysmal atrial fibrillation Long-term anticoagulation PTA meds- amiodarone , metoprolol . Continue amiodarone .  Metoprolol  has been resumed at a low dose of 25 mg twice daily. Will resume Eliquis  when okay with GI.  Cadaver renal transplant status 2018  Transplant CKD 3 A Baseline creatinine less than 2.  Creatinine remains at baseline. Continue to trend  Continue prograf  and cellcept   Follows up with nephrologist Dr. Marlee.  Recent Labs    04/16/24 0505 04/17/24 0438 04/18/24 0428 04/23/24 0947 08/07/24 1757 08/07/24 1803 08/08/24 0238 08/08/24 0825 08/08/24 1531 08/09/24 0232 08/10/24 0540  BUN 9 14 22 15  42* 42* 45* 50* 49* 41* 21  CREATININE 1.44*  1.70* 2.00* 1.74* 1.90* 1.71* 1.90* 2.16* 2.11* 1.95* 1.78*  CO2 24 23 24  18*  --  20* 20* 21* 20* 19* 24   HypERkalemia Potassium has been running on the higher side of normal mostly.  1 dose of Lokelma  was given yesterday.  Repeat Lokelma  today. Recent Labs  Lab  08/08/24 0825 08/08/24 1320 08/08/24 1531 08/09/24 0232 08/10/24 0540  K 5.8* 5.9* 4.9 5.5* 4.3   H/o CAD Currently has no anginal symptoms  PTA meds-Eliquis , statin Continue statin.  Eliquis  plan as above.   Nutrition Status:         Mobility: Able to ambulate independently  PT Orders:   PT Follow up Rec:     Goals of care   Code Status: Full Code     DVT prophylaxis:  SCDs Start: 08/07/24 2007 Place TED hose Start: 08/07/24 2007   Antimicrobials: None Fluid: None Consultants: GI Family Communication: None at bedside  Status: Inpatient Level of care:  Telemetry   Patient is from: Home Needs to continue in-hospital care: Hemoglobin still dropping.  Continue to monitor Anticipated d/c to: Home in 1 to 2 days likely     Diet:  Diet Order             Diet full liquid Room service appropriate? Yes; Fluid consistency: Thin  Diet effective now                   Scheduled Meds:  sodium chloride    Intravenous Once   amiodarone   200 mg Oral Daily   metoprolol  tartrate  25 mg Oral BID   mycophenolate   1,000 mg Oral BID   pantoprazole  (PROTONIX ) IV  40 mg Intravenous Q12H   predniSONE   5 mg Oral Daily   tacrolimus   2 mg Oral QHS   tacrolimus   3 mg Oral Daily    PRN meds: acetaminophen  **OR** acetaminophen , fentaNYL  (SUBLIMAZE ) injection, metoprolol  tartrate, ondansetron  **OR** ondansetron  (ZOFRAN ) IV   Infusions:    Antimicrobials: Anti-infectives (From admission, onward)    None       Objective: Vitals:   08/09/24 2037 08/10/24 0518  BP: 128/82 (!) 141/84  Pulse: 72 70  Resp: 18 18  Temp: 98.7 F (37.1 C) 98.1 F (36.7 C)  SpO2: 98% 100%    Intake/Output Summary (Last 24 hours) at 08/10/2024 1259 Last data filed at 08/09/2024 1810 Gross per 24 hour  Intake 690 ml  Output 900 ml  Net -210 ml   Filed Weights   08/07/24 1800 08/07/24 2200  Weight: 112 kg 106 kg   Weight change:  Body mass index is 31.69 kg/m.    Physical Exam: General exam: Pleasant, elderly African-American male.  Not in distress.  Rectal tube and bag with melanotic stool from yesterday Skin: No rashes, lesions or ulcers. HEENT: Atraumatic, normocephalic, no obvious bleeding Lungs: Clear to auscultation bilaterally,  CVS: S1, S2, no murmur,   GI/Abd: Soft, nontender, nondistended, bowel sound present,   CNS: Alert, awake, oriented x 3 Psychiatry: Mood appropriate Extremities: No pedal edema, no calf tenderness  Data Review: I have personally reviewed the laboratory data and studies available.  F/u labs  Unresulted Labs (From admission, onward)     Start     Ordered   08/11/24 0500  CBC with Differential/Platelet  Tomorrow morning,   R       Question:  Specimen collection method  Answer:  Lab=Lab collect   08/10/24 0944   08/11/24 0500  Basic metabolic  panel with GFR  Tomorrow morning,   R       Question:  Specimen collection method  Answer:  Lab=Lab collect   08/10/24 0944            Signed, Chapman Rota, MD [facility] 08/10/2024  "

## 2024-08-10 NOTE — Plan of Care (Signed)
" °  Problem: Education: Goal: Knowledge of General Education information will improve Description: Including pain rating scale, medication(s)/side effects and non-pharmacologic comfort measures Outcome: Progressing   Problem: Health Behavior/Discharge Planning: Goal: Ability to manage health-related needs will improve Outcome: Progressing   Problem: Clinical Measurements: Goal: Ability to maintain clinical measurements within normal limits will improve Outcome: Progressing Goal: Will remain free from infection Outcome: Progressing Goal: Diagnostic test results will improve Outcome: Progressing Goal: Respiratory complications will improve Outcome: Progressing Goal: Cardiovascular complication will be avoided Outcome: Progressing   Problem: Activity: Goal: Risk for activity intolerance will decrease Outcome: Progressing   Problem: Nutrition: Goal: Adequate nutrition will be maintained Outcome: Progressing   Problem: Coping: Goal: Level of anxiety will decrease Outcome: Progressing   Problem: Elimination: Goal: Will not experience complications related to bowel motility Outcome: Progressing Goal: Will not experience complications related to urinary retention Outcome: Progressing   Problem: Pain Managment: Goal: General experience of comfort will improve and/or be controlled Outcome: Progressing   Problem: Safety: Goal: Ability to remain free from injury will improve Outcome: Progressing   Problem: Skin Integrity: Goal: Risk for impaired skin integrity will decrease Outcome: Progressing   Problem: Education: Goal: Knowledge of the prescribed therapeutic regimen will improve Outcome: Progressing   Problem: Bowel/Gastric: Goal: Gastrointestinal status for postoperative course will improve Outcome: Progressing   Problem: Nutritional: Goal: Will attain and maintain optimal nutritional status Outcome: Progressing   Problem: Neurological: Goal: Will regain or  maintain usual level of consciousness Outcome: Progressing   Problem: Clinical Measurements: Goal: Ability to maintain clinical measurements within normal limits Outcome: Progressing Goal: Postoperative complications will be avoided or minimized Outcome: Progressing   Problem: Respiratory: Goal: Will regain and/or maintain adequate ventilation Outcome: Progressing Goal: Respiratory status will improve Outcome: Progressing   Problem: Skin Integrity: Goal: Demonstrates signs of wound healing without infection Outcome: Progressing   Problem: Urinary Elimination: Goal: Will remain free from infection Outcome: Progressing Goal: Ability to achieve and maintain adequate urine output Outcome: Progressing   "

## 2024-08-11 LAB — BPAM RBC
Blood Product Expiration Date: 202601222359
Blood Product Expiration Date: 202601232359
ISSUE DATE / TIME: 202601101041
ISSUE DATE / TIME: 202601121424
Unit Type and Rh: 202601232359
Unit Type and Rh: 9500
Unit Type and Rh: 9500

## 2024-08-11 LAB — CBC WITH DIFFERENTIAL/PLATELET
Abs Immature Granulocytes: 0.04 K/uL (ref 0.00–0.07)
Basophils Absolute: 0 K/uL (ref 0.0–0.1)
Basophils Relative: 0 %
Eosinophils Absolute: 0.2 K/uL (ref 0.0–0.5)
Eosinophils Relative: 3 %
HCT: 24.9 % — ABNORMAL LOW (ref 39.0–52.0)
Hemoglobin: 7.8 g/dL — ABNORMAL LOW (ref 13.0–17.0)
Immature Granulocytes: 1 %
Lymphocytes Relative: 21 %
Lymphs Abs: 1.1 K/uL (ref 0.7–4.0)
MCH: 29.5 pg (ref 26.0–34.0)
MCHC: 31.3 g/dL (ref 30.0–36.0)
MCV: 94.3 fL (ref 80.0–100.0)
Monocytes Absolute: 0.5 K/uL (ref 0.1–1.0)
Monocytes Relative: 9 %
Neutro Abs: 3.4 K/uL (ref 1.7–7.7)
Neutrophils Relative %: 66 %
Platelets: 91 K/uL — ABNORMAL LOW (ref 150–400)
RBC: 2.64 MIL/uL — ABNORMAL LOW (ref 4.22–5.81)
RDW: 15.5 % (ref 11.5–15.5)
Smear Review: NORMAL
WBC: 5.2 K/uL (ref 4.0–10.5)
nRBC: 1.1 % — ABNORMAL HIGH (ref 0.0–0.2)

## 2024-08-11 LAB — TYPE AND SCREEN
ABO/RH(D): O NEG
Antibody Screen: POSITIVE
Donor AG Type: NEGATIVE
Donor AG Type: NEGATIVE
Unit division: 0
Unit division: 0

## 2024-08-11 LAB — BASIC METABOLIC PANEL WITH GFR
Anion gap: 8 (ref 5–15)
BUN: 15 mg/dL (ref 8–23)
CO2: 23 mmol/L (ref 22–32)
Calcium: 9.1 mg/dL (ref 8.9–10.3)
Chloride: 107 mmol/L (ref 98–111)
Creatinine, Ser: 1.68 mg/dL — ABNORMAL HIGH (ref 0.61–1.24)
GFR, Estimated: 45 mL/min — ABNORMAL LOW
Glucose, Bld: 116 mg/dL — ABNORMAL HIGH (ref 70–99)
Potassium: 4.3 mmol/L (ref 3.5–5.1)
Sodium: 139 mmol/L (ref 135–145)

## 2024-08-11 MED ORDER — METOPROLOL TARTRATE 50 MG PO TABS
100.0000 mg | ORAL_TABLET | Freq: Two times a day (BID) | ORAL | Status: DC
  Administered 2024-08-11 – 2024-08-12 (×3): 100 mg via ORAL
  Filled 2024-08-11 (×3): qty 2

## 2024-08-11 MED ORDER — SPIRONOLACTONE 12.5 MG HALF TABLET
12.5000 mg | ORAL_TABLET | Freq: Every day | ORAL | Status: DC
  Administered 2024-08-11 – 2024-08-12 (×2): 12.5 mg via ORAL
  Filled 2024-08-11 (×2): qty 1

## 2024-08-11 MED ORDER — EMPAGLIFLOZIN 10 MG PO TABS
10.0000 mg | ORAL_TABLET | Freq: Every day | ORAL | Status: DC
  Administered 2024-08-12: 10 mg via ORAL
  Filled 2024-08-11: qty 1

## 2024-08-11 NOTE — Plan of Care (Signed)
   Problem: Education: Goal: Knowledge of General Education information will improve Description Including pain rating scale, medication(s)/side effects and non-pharmacologic comfort measures Outcome: Progressing   Problem: Health Behavior/Discharge Planning: Goal: Ability to manage health-related needs will improve Outcome: Progressing

## 2024-08-11 NOTE — Plan of Care (Signed)

## 2024-08-11 NOTE — Progress Notes (Signed)
 " PROGRESS NOTE  Jon [name]  DOB: 02/07/1956  PCP: [facility], [facility] FMW:[id number]  DOA: 08/07/2024  LOS: 4 days  Hospital Day: 5  Subjective: Patient was seen and examined this morning. Lying on bed.  Not in distress. Tolerating liquid diet.  Still passing melanotic stool Afebrile, hemodynamically stable with blood pressure as high as 161/97 this morning. Labs from this morning with hemoglobin stable at 7.8, creatinine at 1.68  Brief narrative: Jon [name] is a 69 y.o. male with PMH significant for Cadaver renal transplantation 2018, transplant CKD, NICM with severe LVH, PAF on Eliquis , HTN. 1/9, patient presented to the ED after an episode of vomiting of blood.  Reported few episodes of diarrhea as well.  Has no prior history of GI bleeding, does not drink alcohol, does not use NSAIDs  While in the ED he had 2 more episodes of hematemesis.  Blood pressure initially 95/63, heart rate 68, breathing on room air Initial hemoglobin 11.8, creatinine 1.71 Patient was started on Protonix  drip, given 1 dose of Kcentra , IV fluid GI consulted Admitted to [facility] Overnight, patient had 2 large dark red bowel movements and another episode of hematemesis. 1/10, EGD showed Mallory-Weiss tear at GE junction.  It was injected, clipped, hemostasis achieved.  Assessment and plan: Upper GI bleed Acute blood loss anemia Anemia of chronic disease Presented with hematemesis in the setting of Eliquis  use. Given IV fluid, IV Kcentra  and started on Protonix  drip Overnight, patient had 2 large dark red bowel movements and another episode of hematemesis.  Also became hypotensive and diaphoretic.   1/10, EGD showed Mallory-Weiss tear at GE junction.  It was injected, clipped, hemostasis achieved. Hemoglobin trend as below.  1 unit of PRBC transfusion was given yesterday 1/12.  Received a total of 2 units this hospitalization. In the last 24 hours, hemoglobin has remained stable.  Able to tolerate full liquid  diet.  Dysphagia is improved.  Continues to pass melanotic stool. Eliquis  on hold. Not on NSAIDs, does not drink alcohol. Recent Labs    08/08/24 1934 08/09/24 0232 08/10/24 0540 08/10/24 1841 08/11/24 0615  HGB 7.9* 8.6* 6.9* 7.6* 7.8*  MCV 93.4 92.6 91.3  --  94.3   Persistent nausea, retching Improving with Zofran  Continue full liquid diet for today  Hypotension H/o hypertension H/o NICM with severe LVH For the first 24 hours, patient was hypotensive because of persistent bleeding.  Antihypertensives were held PTA meds- metoprolol , Jardiance , Aldactone , hydralazine , Currently only on metoprolol  50 mg twice daily.  Blood pressure uptrending, and 160s this morning.  This morning, I increased metoprolol  dose to 100 mg twice daily and resumed Jardiance  and Aldactone .  Paroxysmal atrial fibrillation Long-term anticoagulation PTA meds- amiodarone , metoprolol . Continue amiodarone .  Had episodes of rapid A-fib yesterday.  Metoprolol  dose was increased.   This morning, further increase metoprolol  dose to home dose of 100 mg twice daily Will resume Eliquis  when okay with GI.  Cadaver renal transplant status 2018  Transplant CKD 3 A Baseline creatinine less than 2.  Creatinine remains at baseline. Continue to trend  Continue prograf  and cellcept .  Also chronically on prophylactic antibiotic with Bactrim  Follows up with nephrologist Dr. Marlee.  Recent Labs    04/17/24 0438 04/18/24 0428 04/23/24 0947 08/07/24 1757 08/07/24 1803 08/08/24 0238 08/08/24 0825 08/08/24 1531 08/09/24 0232 08/10/24 0540 08/11/24 0615  BUN 14 22 15  42* 42* 45* 50* 49* 41* 21 15  CREATININE 1.70* 2.00* 1.74* 1.90* 1.71* 1.90* 2.16* 2.11* 1.95* 1.78* 1.68*  CO2 23 24 18*  --  20* 20* 21* 20* 19* 24 23   HypERkalemia Potassium level improved with Lokelma .   Recent Labs  Lab 08/08/24 1320 08/08/24 1531 08/09/24 0232 08/10/24 0540 08/11/24 0615  K 5.9* 4.9 5.5* 4.3 4.3   H/o  CAD Currently has no anginal symptoms  PTA meds-Eliquis , statin Continue statin.  Eliquis  plan as above.   Nutrition Status:         Mobility: Able to ambulate independently  PT Orders:   PT Follow up Rec:     Goals of care   Code Status: Full Code     DVT prophylaxis:  SCDs Start: 08/07/24 2007 Place TED hose Start: 08/07/24 2007   Antimicrobials: None Fluid: None Consultants: GI Family Communication: None at bedside  Status: Inpatient Level of care:  Telemetry   Patient is from: Home Needs to continue in-hospital care: Hemoglobin still dropping.  Continue to monitor Anticipated d/c to: Home tomorrow if hemoglobin stable.    Diet:  Diet Order             DIET SOFT Room service appropriate? Yes; Fluid consistency: Thin  Diet effective now                   Scheduled Meds:  amiodarone   200 mg Oral Daily   [START ON 08/12/2024] empagliflozin   10 mg Oral QAC breakfast   metoprolol  tartrate  100 mg Oral BID   mycophenolate   1,000 mg Oral BID   pantoprazole  (PROTONIX ) IV  40 mg Intravenous Q12H   predniSONE   5 mg Oral Daily   spironolactone   12.5 mg Oral Daily   sulfamethoxazole -trimethoprim   10 mL Oral Once per day on Monday Wednesday Friday   tacrolimus   2 mg Oral QHS   tacrolimus   3 mg Oral Daily    PRN meds: acetaminophen  **OR** acetaminophen , fentaNYL  (SUBLIMAZE ) injection, metoprolol  tartrate, ondansetron  **OR** ondansetron  (ZOFRAN ) IV, sodium chloride    Infusions:   sodium chloride       Antimicrobials: Anti-infectives (From admission, onward)    Start     Dose/Rate Route Frequency Ordered Stop   08/10/24 1600  sulfamethoxazole -trimethoprim  (BACTRIM ) 200-40 MG/5ML suspension 10 mL        10 mL Oral Once per day on Monday Wednesday Friday 08/10/24 1353         Objective: Vitals:   08/11/24 0748 08/11/24 1232  BP: (!) 144/88 138/78  Pulse: 61 (!) 57  Resp: 14 16  Temp: 98.4 F (36.9 C) 98.2 F (36.8 C)  SpO2:  99%     Intake/Output Summary (Last 24 hours) at 08/11/2024 1313 Last data filed at 08/11/2024 0600 Gross per 24 hour  Intake 658 ml  Output 175 ml  Net 483 ml   Filed Weights   08/07/24 1800 08/07/24 2200  Weight: 112 kg 106 kg   Weight change:  Body mass index is 31.69 kg/m.   Physical Exam: General exam: Pleasant, elderly African-American male.  Not in distress.  Skin: No rashes, lesions or ulcers. HEENT: Atraumatic, normocephalic, no obvious bleeding Lungs: Clear to auscultation bilaterally,  CVS: S1, S2, no murmur,   GI/Abd: Soft, nontender, nondistended, bowel sound present,   CNS: Alert, awake, oriented x 3 Psychiatry: Mood appropriate Extremities: No pedal edema, no calf tenderness  Data Review: I have personally reviewed the laboratory data and studies available.  F/u labs  Unresulted Labs (From admission, onward)     Start     Ordered   08/12/24 0500  Basic metabolic panel with GFR  Tomorrow morning,   R       Question:  Specimen collection method  Answer:  Lab=Lab collect   08/11/24 1313   08/12/24 0500  CBC with Differential/Platelet  Tomorrow morning,   R       Question:  Specimen collection method  Answer:  Lab=Lab collect   08/11/24 1313            Signed, Chapman Rota, MD [facility] 08/11/2024  "

## 2024-08-12 ENCOUNTER — Other Ambulatory Visit (HOSPITAL_COMMUNITY): Payer: Self-pay

## 2024-08-12 LAB — CBC WITH DIFFERENTIAL/PLATELET
Abs Immature Granulocytes: 0.04 K/uL (ref 0.00–0.07)
Basophils Absolute: 0 K/uL (ref 0.0–0.1)
Basophils Relative: 1 %
Eosinophils Absolute: 0.1 K/uL (ref 0.0–0.5)
Eosinophils Relative: 3 %
HCT: 24.9 % — ABNORMAL LOW (ref 39.0–52.0)
Hemoglobin: 7.7 g/dL — ABNORMAL LOW (ref 13.0–17.0)
Immature Granulocytes: 1 %
Lymphocytes Relative: 20 %
Lymphs Abs: 1.1 K/uL (ref 0.7–4.0)
MCH: 28.7 pg (ref 26.0–34.0)
MCHC: 30.9 g/dL (ref 30.0–36.0)
MCV: 92.9 fL (ref 80.0–100.0)
Monocytes Absolute: 0.5 K/uL (ref 0.1–1.0)
Monocytes Relative: 10 %
Neutro Abs: 3.7 K/uL (ref 1.7–7.7)
Neutrophils Relative %: 65 %
Platelets: 106 K/uL — ABNORMAL LOW (ref 150–400)
RBC: 2.68 MIL/uL — ABNORMAL LOW (ref 4.22–5.81)
RDW: 15.6 % — ABNORMAL HIGH (ref 11.5–15.5)
Smear Review: NORMAL
WBC: 5.6 K/uL (ref 4.0–10.5)
nRBC: 1.1 % — ABNORMAL HIGH (ref 0.0–0.2)

## 2024-08-12 LAB — BASIC METABOLIC PANEL WITH GFR
Anion gap: 8 (ref 5–15)
BUN: 10 mg/dL (ref 8–23)
CO2: 25 mmol/L (ref 22–32)
Calcium: 9 mg/dL (ref 8.9–10.3)
Chloride: 107 mmol/L (ref 98–111)
Creatinine, Ser: 1.54 mg/dL — ABNORMAL HIGH (ref 0.61–1.24)
GFR, Estimated: 49 mL/min — ABNORMAL LOW
Glucose, Bld: 122 mg/dL — ABNORMAL HIGH (ref 70–99)
Potassium: 3.8 mmol/L (ref 3.5–5.1)
Sodium: 140 mmol/L (ref 135–145)

## 2024-08-12 LAB — TROPONIN T, HIGH SENSITIVITY: Troponin T High Sensitivity: 68 ng/L — ABNORMAL HIGH (ref 0–19)

## 2024-08-12 MED ORDER — CALCIUM CARBONATE ANTACID 500 MG PO CHEW
1.0000 | CHEWABLE_TABLET | Freq: Three times a day (TID) | ORAL | 0 refills | Status: AC | PRN
  Filled 2024-08-12: qty 90, 30d supply, fill #0

## 2024-08-12 MED ORDER — SUCRALFATE 1 GM/10ML PO SUSP
1.0000 g | Freq: Three times a day (TID) | ORAL | 0 refills | Status: AC
  Filled 2024-08-12: qty 420, 14d supply, fill #0

## 2024-08-12 MED ORDER — PANTOPRAZOLE SODIUM 40 MG PO TBEC
40.0000 mg | DELAYED_RELEASE_TABLET | Freq: Every day | ORAL | 0 refills | Status: DC
  Filled 2024-08-12: qty 90, 90d supply, fill #0

## 2024-08-12 MED ORDER — ALUM & MAG HYDROXIDE-SIMETH 200-200-20 MG/5ML PO SUSP
30.0000 mL | Freq: Once | ORAL | Status: AC
  Administered 2024-08-12: 30 mL via ORAL
  Filled 2024-08-12: qty 30

## 2024-08-12 MED ORDER — CALCIUM CARBONATE ANTACID 500 MG PO CHEW
1.0000 | CHEWABLE_TABLET | Freq: Three times a day (TID) | ORAL | Status: DC | PRN
  Administered 2024-08-12: 200 mg via ORAL
  Filled 2024-08-12: qty 1

## 2024-08-12 MED ORDER — SUCRALFATE 1 GM/10ML PO SUSP
1.0000 g | Freq: Three times a day (TID) | ORAL | Status: DC
  Administered 2024-08-12 (×2): 1 g via ORAL
  Filled 2024-08-12 (×2): qty 10

## 2024-08-12 MED ORDER — FAMOTIDINE IN NACL 20-0.9 MG/50ML-% IV SOLN
20.0000 mg | Freq: Two times a day (BID) | INTRAVENOUS | Status: DC
  Administered 2024-08-12: 20 mg via INTRAVENOUS
  Filled 2024-08-12: qty 50

## 2024-08-12 NOTE — Progress Notes (Addendum)
 No change from am assessment. Pt is stable, a&ox4, ambulatory. Discharge instructions were reviewed. He denied questions, or concerns at this time. Pt ware dc will be complete once medications received from [facility] outpatient pharmacy.  I removed, site clean dry and intact. Telemetry #[id number] returned to desk

## 2024-08-12 NOTE — Plan of Care (Signed)

## 2024-08-12 NOTE — Discharge Summary (Signed)
 "  Physician Discharge Summary  Harrol Novello FMW:[id number] DOB: 1955-08-12 DOA: 08/07/2024  PCP: [facility] [address]  Admit date: 08/07/2024 Discharge date: 08/12/2024  Admitted from: Home Discharge disposition: Home  Recommendations at discharge:  You have been started on Protonix  40 mg daily for long-term, Carafate  for 14 days and as needed Tums. Hold Eliquis  for at least another week and restart at previous dose.   Subjective: Patient was seen and examined this morning. Lying down in bed.  Not in distress. Was complaining of heartburn earlier today, improved sucralfate  Afebrile, hemodynamically stable, breathing on room air Labs from this morning with sodium 140, PRN/creatinine 10/1.54, hemoglobin stable at 7.7  Brief narrative: Jon [name] is a 69 y.o. male with PMH significant for Cadaver renal transplantation 2018, transplant CKD, NICM with severe LVH, PAF on Eliquis , HTN. 1/9, patient presented to the ED after an episode of vomiting of blood.  Reported few episodes of diarrhea as well.  Has no prior history of GI bleeding, does not drink alcohol, does not use NSAIDs  While in the ED he had 2 more episodes of hematemesis.  Blood pressure initially 95/63, heart rate 68, breathing on room air Initial hemoglobin 11.8, creatinine 1.71 Patient was started on Protonix  drip, given 1 dose of Kcentra , IV fluid GI consulted Admitted to [facility] Overnight, patient had 2 large dark red bowel movements and another episode of hematemesis. 1/10, EGD showed Mallory-Weiss tear at GE junction.  It was injected, clipped, hemostasis achieved.  Hospital course: Upper GI bleed Acute blood loss anemia Anemia of chronic disease Presented with hematemesis in the setting of Eliquis  use. Given IV fluid, IV Kcentra  and started on Protonix  drip Overnight, patient had 2 large dark red bowel movements and another episode of hematemesis.  Also became hypotensive and diaphoretic.   1/10, EGD showed Mallory-Weiss  tear at GE junction.  It was injected, clipped, hemostasis achieved. Following that, patient continued to have melena for 2 days which has now improved.  His hemoglobin has stabilized between 7 and 8 for last 3 days. He has intermittent heartburn that is improved with Tums and Carafate . Able to tolerate regular consistency diet Plan to discharge him on Protonix  40 mg daily, 14 days of Carafate  and as needed Tums  I would hold Eliquis  for another week and after that restart at previous dose.   Continue to avoid NSAIDs and alcohol. Recent Labs    08/09/24 0232 08/10/24 0540 08/10/24 1841 08/11/24 0615 08/12/24 0619  HGB 8.6* 6.9* 7.6* 7.8* 7.7*  MCV 92.6 91.3  --  94.3 92.9   Hypotension H/o hypertension H/o NICM with severe LVH For the first 24 hours, patient was hypotensive because of persistent bleeding.  Antihypertensives were held PTA meds- metoprolol , Jardiance , Aldactone , hydralazine . His heart rate and blood pressure gradually improved and all his antihypertensives were resumed.  A-fib with RVR  H/o paroxysmal atrial fibrillation Long-term anticoagulation Metoprolol  was initially held due to low blood pressure. In the last 3 days, patient had short lasting intermittent episodes of RVR and required IV metoprolol .  Medrol  dose has been gradually increased to home dose. Continue amiodarone , metoprolol  as before. I would hold Eliquis  for another week and after that restart at previous dose.    Cadaver renal transplant status 2018  Transplant CKD 3 A Baseline creatinine less than 2.  Creatinine remains at baseline. Continue to trend  Continue prograf  and cellcept .  Also chronically on prophylactic antibiotic with Bactrim  Follows up with nephrologist Dr. Marlee.  Recent  Labs    04/18/24 0428 04/23/24 0947 08/07/24 1757 08/07/24 1803 08/08/24 0238 08/08/24 0825 08/08/24 1531 08/09/24 0232 08/10/24 0540 08/11/24 0615 08/12/24 0619  BUN 22 15 42* 42* 45* 50* 49* 41*  21 15 10   CREATININE 2.00* 1.74* 1.90* 1.71* 1.90* 2.16* 2.11* 1.95* 1.78* 1.68* 1.54*  CO2 24 18*  --  20* 20* 21* 20* 19* 24 23 25    HypERkalemia Potassium level improved with Lokelma .   Recent Labs  Lab 08/08/24 1531 08/09/24 0232 08/10/24 0540 08/11/24 0615 08/12/24 0619  K 4.9 5.5* 4.3 4.3 3.8   H/o CAD Currently has no anginal symptoms  PTA meds-Eliquis , statin Continue statin.  Eliquis  plan as above.  Goals of care   Code Status: Full Code   Diet:  Diet Order             Diet - low sodium heart healthy           DIET SOFT Room service appropriate? Yes; Fluid consistency: Thin  Diet effective now                   Nutritional status:  Body mass index is 31.69 kg/m.       Wounds:  -    Discharge Medications:   Allergies as of 08/12/2024   No Known Allergies      Medication List     PAUSE taking these medications    apixaban  5 MG Tabs tablet Wait to take this until: August 19, 2024 Commonly known as: ELIQUIS  Take 1 tablet (5 mg total) by mouth 2 (two) times daily.       STOP taking these medications    amitriptyline  25 MG tablet Commonly known as: ELAVIL    amLODipine  10 MG tablet Commonly known as: NORVASC    furosemide  20 MG tablet Commonly known as: LASIX        TAKE these medications    albuterol  108 (90 Base) MCG/ACT inhaler Commonly known as: VENTOLIN  HFA Inhale 2 puffs into the lungs every 6 (six) hours as needed for wheezing or shortness of breath.   amiodarone  200 MG tablet Commonly known as: PACERONE  Take 1 tablet (200 mg total) by mouth daily.   atorvastatin  20 MG tablet Commonly known as: LIPITOR Take 1 tablet (20 mg total) by mouth at bedtime.   brimonidine -timolol  0.2-0.5 % ophthalmic solution Commonly known as: COMBIGAN  Place 1 drop into both eyes every 12 (twelve) hours.   calcium  carbonate 500 MG chewable tablet Commonly known as: TUMS - dosed in mg elemental calcium  Chew 1 tablet (200 mg of  elemental calcium  total) by mouth 3 (three) times daily as needed for indigestion or heartburn.   empagliflozin  10 MG Tabs tablet Commonly known as: Jardiance  Take 1 tablet (10 mg total) by mouth daily before breakfast.   Gemtesa 75 MG Tabs Generic drug: Vibegron Take 1 tablet by mouth daily.   hydrALAZINE  100 MG tablet Commonly known as: APRESOLINE  Take 1 tablet (100 mg total) by mouth every 8 (eight) hours. What changed: when to take this   magnesium  oxide 400 MG tablet Commonly known as: MAG-OX Take 800 mg by mouth 2 (two) times daily.   metoprolol  tartrate 100 MG tablet Commonly known as: LOPRESSOR  Take 1 tablet (100 mg total) by mouth 2 (two) times daily.   mycophenolate  200 MG/ML suspension Commonly known as: CELLCEPT  Take 1,000 mg by mouth 2 (two) times a day.   pantoprazole  40 MG tablet Commonly known as: Protonix  Take 1 tablet (40 mg  total) by mouth daily.   predniSONE  5 MG/5ML solution Take 5 mg by mouth daily.   Prograf  1 MG capsule Generic drug: tacrolimus  Take 2-3 mg by mouth See admin instructions. Take 3 mg by mouth in the morning and 2 mg at bedtime   spironolactone  25 MG tablet Commonly known as: ALDACTONE  Take 0.5 tablet (12.5 mg total) by mouth daily.   sucralfate  1 GM/10ML suspension Commonly known as: CARAFATE  Take 10 mLs (1 g total) by mouth 3 (three) times daily before meals.   sulfamethoxazole -trimethoprim  200-40 MG/5ML suspension Commonly known as: BACTRIM  Take 10 mLs by mouth 3 (three) times a week. Monday, Wednesday, Friday   tamsulosin 0.4 MG Caps capsule Commonly known as: FLOMAX Take 0.4 mg by mouth at bedtime.   Vyzulta  0.024 % Soln Generic drug: Latanoprostene Bunod  Place 1 drop into both eyes at bedtime.         Follow ups:    Follow-up Information     [facility] [address] Follow up.   Contact information: [address] [place] [address] [phone]                 Discharge Instructions:    Discharge Instructions     Call MD for:  difficulty breathing, headache or visual disturbances   Complete by: As directed    Call MD for:  extreme fatigue   Complete by: As directed    Call MD for:  hives   Complete by: As directed    Call MD for:  persistant dizziness or light-headedness   Complete by: As directed    Call MD for:  persistant nausea and vomiting   Complete by: As directed    Call MD for:  severe uncontrolled pain   Complete by: As directed    Call MD for:  temperature >100.4   Complete by: As directed    Diet - low sodium heart healthy   Complete by: As directed    Discharge instructions   Complete by: As directed    Recommendations at discharge:   You have been started on Protonix  40 mg daily for long-term, Carafate  for 14 days and as needed Tums.  Hold Eliquis  for at least another week and restart at previous dose.  Discharge instructions for CHF Check weight daily -preferably same time every day. Restrict fluid intake to 1200 ml daily Restrict salt intake to less than 2 g daily. Call MD if you have one of the following symptoms 1) 3 pound weight gain in 24 hours or 5 pounds in 1 week  2) swelling in the hands, feet or stomach  3) progressive shortness of breath 4) if you have to sleep on extra pillows at night in order to breathe       General discharge instructions: Follow with [facility] [address] in 7 days  Please request your PCP  to go over your hospital tests, procedures, radiology results at the follow up. Please get your medicines reviewed and adjusted.  Your PCP may decide to repeat certain labs or tests as needed. Do not drive, operate heavy machinery, perform activities at heights, swimming or participation in water  activities or provide baby sitting services if your were admitted for syncope or siezures until you have seen by Primary MD or a Neurologist and advised to do so again. [place]  Controlled Substance Reporting  System database was reviewed. Do not drive, operate heavy machinery, perform activities at heights, swim, participate in water  activities or provide baby-sitting  services while on medications for pain, sleep and mood until your outpatient physician has reevaluated you and advised to do so again.  You are strongly recommended to comply with the dose, frequency and duration of prescribed medications. Activity: As tolerated with Full fall precautions use Kunz/cane & assistance as needed Avoid using any recreational substances like cigarette, tobacco, alcohol, or non-prescribed drug. If you experience worsening of your admission symptoms, develop shortness of breath, life threatening emergency, suicidal or homicidal thoughts you must seek medical attention immediately by calling 911 or calling your MD immediately  if symptoms less severe. You must read complete instructions/literature along with all the possible adverse reactions/side effects for all the medicines you take and that have been prescribed to you. Take any new medicine only after you have completely understood and accepted all the possible adverse reactions/side effects.  Wear Seat belts while driving. You were cared for by a hospitalist during your hospital stay. If you have any questions about your discharge medications or the care you received while you were in the hospital after you are discharged, you can call the unit and ask to speak with the hospitalist or the covering physician. Once you are discharged, your primary care physician will handle any further medical issues. Please note that NO REFILLS for any discharge medications will be authorized once you are discharged, as it is imperative that you return to your primary care physician (or establish a relationship with a primary care physician if you do not have one).   Increase activity slowly   Complete by: As directed        Discharge Exam:   Vitals:   08/12/24 0208 08/12/24  0502 08/12/24 0917 08/12/24 1213  BP: (!) 152/92 (!) 164/87 133/87 137/77  Pulse: (!) 57 60 (!) 58 (!) 58  Resp:  18 16 18   Temp:  98.6 F (37 C) 98.4 F (36.9 C) 98.4 F (36.9 C)  TempSrc:  Oral Oral Oral  SpO2:      Weight:      Height:        Body mass index is 31.69 kg/m.   General exam: Pleasant, elderly African-American male.  Not in distress.  Skin: No rashes, lesions or ulcers. HEENT: Atraumatic, normocephalic, no obvious bleeding Lungs: Clear to auscultation bilaterally,  CVS: S1, S2, no murmur,   GI/Abd: Soft, nontender, nondistended, bowel sound present,   CNS: Alert, awake, oriented x 3 Psychiatry: Mood appropriate Extremities: No pedal edema, no calf tenderness   The results of significant diagnostics from this hospitalization (including imaging, microbiology, ancillary and laboratory) are listed below for reference.    Procedures and Diagnostic Studies:   No results found.   Labs:   Basic Metabolic Panel: Recent Labs  Lab 08/08/24 1531 08/09/24 0232 08/10/24 0540 08/11/24 0615 08/12/24 0619  NA 139 138 140 139 140  K 4.9 5.5* 4.3 4.3 3.8  CL 110 110 108 107 107  CO2 20* 19* 24 23 25   GLUCOSE 138* 123* 119* 116* 122*  BUN 49* 41* 21 15 10   CREATININE 2.11* 1.95* 1.78* 1.68* 1.54*  CALCIUM  8.8* 8.9 8.9 9.1 9.0   GFR Estimated Creatinine Clearance: 59.4 mL/min (A) (by C-G formula based on SCr of 1.54 mg/dL (H)). Liver Function Tests: Recent Labs  Lab 08/07/24 1803  AST 17  ALT 6  ALKPHOS 52  BILITOT 0.6  PROT 5.7*  ALBUMIN  3.6   No results for input(s): LIPASE, AMYLASE in the last 168 hours. No results for  input(s): AMMONIA in the last 168 hours. Coagulation profile Recent Labs  Lab 08/07/24 1803  INR 1.4*    CBC: Recent Labs  Lab 08/08/24 1934 08/09/24 0232 08/10/24 0540 08/10/24 1841 08/11/24 0615 08/12/24 0619  WBC 7.9 8.0 6.0  --  5.2 5.6  NEUTROABS 5.7 5.8 4.0  --  3.4 3.7  HGB 7.9* 8.6* 6.9* 7.6* 7.8* 7.7*   HCT 25.6* 27.4* 22.0* 24.2* 24.9* 24.9*  MCV 93.4 92.6 91.3  --  94.3 92.9  PLT 77* 80* 80*  --  91* 106*   Cardiac Enzymes: No results for input(s): CKTOTAL, CKMB, CKMBINDEX, TROPONINI in the last 168 hours. BNP: Invalid input(s): POCBNP CBG: Recent Labs  Lab 08/08/24 1212 08/08/24 1644  GLUCAP 117* 109*   D-Dimer No results for input(s): DDIMER in the last 72 hours. Hgb A1c No results for input(s): HGBA1C in the last 72 hours. Lipid Profile No results for input(s): CHOL, HDL, LDLCALC, TRIG, CHOLHDL, LDLDIRECT in the last 72 hours. Thyroid function studies No results for input(s): TSH, T4TOTAL, T3FREE, THYROIDAB in the last 72 hours.  Invalid input(s): FREET3 Anemia work up No results for input(s): VITAMINB12, FOLATE, FERRITIN, TIBC, IRON, RETICCTPCT in the last 72 hours. Microbiology Recent Results (from the past 240 hours)  MRSA Next Gen by PCR, Nasal     Status: None   Collection Time: 08/07/24  9:28 PM   Specimen: Nasal Mucosa; Nasal Swab  Result Value Ref Range Status   MRSA by PCR Next Gen NOT DETECTED NOT DETECTED Final    Comment: (NOTE) The GeneXpert MRSA Assay (FDA approved for NASAL specimens only), is one component of a comprehensive MRSA colonization surveillance program. It is not intended to diagnose MRSA infection nor to guide or monitor treatment for MRSA infections. Test performance is not FDA approved in patients less than 60 years old. Performed at [facility], [address], [place], [place] [address]   Gastrointestinal Panel by PCR , Stool     Status: None   Collection Time: 08/08/24 12:56 AM   Specimen: Stool  Result Value Ref Range Status   Campylobacter species NOT DETECTED NOT DETECTED Final   Plesimonas shigelloides NOT DETECTED NOT DETECTED Final   Salmonella species NOT DETECTED NOT DETECTED Final   Yersinia enterocolitica NOT DETECTED NOT DETECTED Final   Vibrio  species NOT DETECTED NOT DETECTED Final   Vibrio cholerae NOT DETECTED NOT DETECTED Final   Enteroaggregative E coli (EAEC) NOT DETECTED NOT DETECTED Final   Enteropathogenic E coli (EPEC) NOT DETECTED NOT DETECTED Final   Enterotoxigenic E coli (ETEC) NOT DETECTED NOT DETECTED Final   Shiga like toxin producing E coli (STEC) NOT DETECTED NOT DETECTED Final   Shigella/Enteroinvasive E coli (EIEC) NOT DETECTED NOT DETECTED Final   Cryptosporidium NOT DETECTED NOT DETECTED Final   Cyclospora cayetanensis NOT DETECTED NOT DETECTED Final   Entamoeba histolytica NOT DETECTED NOT DETECTED Final   Giardia lamblia NOT DETECTED NOT DETECTED Final   Adenovirus F40/41 NOT DETECTED NOT DETECTED Final   Astrovirus NOT DETECTED NOT DETECTED Final   Norovirus GI/GII NOT DETECTED NOT DETECTED Final   Rotavirus A NOT DETECTED NOT DETECTED Final   Sapovirus (I, II, IV, and V) NOT DETECTED NOT DETECTED Final    Comment: Performed at [facility], [address], [place], [place] [address]    Time coordinating discharge: 45 minutes  Signed: Chapman Zahira Brummond  [facility] 08/12/2024, 12:54 PM   "

## 2024-08-12 NOTE — Progress Notes (Signed)
 Discharge Medications delivered from TOC meds to bed [facility] outpatient pharmacy by this RN.

## 2024-08-19 ENCOUNTER — Inpatient Hospital Stay (HOSPITAL_COMMUNITY)
Admission: EM | Admit: 2024-08-19 | Discharge: 2024-08-21 | DRG: 065 | Disposition: A | Discharge status: Home / Self Care | Attending: Internal Medicine | Admitting: Internal Medicine

## 2024-08-19 ENCOUNTER — Emergency Department (HOSPITAL_COMMUNITY)

## 2024-08-19 ENCOUNTER — Other Ambulatory Visit: Payer: Self-pay

## 2024-08-19 DIAGNOSIS — Z6833 Body mass index (BMI) 33.0-33.9, adult: Secondary | ICD-10-CM

## 2024-08-19 DIAGNOSIS — E785 Hyperlipidemia, unspecified: Secondary | ICD-10-CM | POA: Diagnosis present

## 2024-08-19 DIAGNOSIS — I252 Old myocardial infarction: Secondary | ICD-10-CM | POA: Diagnosis not present

## 2024-08-19 DIAGNOSIS — D509 Iron deficiency anemia, unspecified: Secondary | ICD-10-CM | POA: Diagnosis present

## 2024-08-19 DIAGNOSIS — I779 Disorder of arteries and arterioles, unspecified: Secondary | ICD-10-CM | POA: Diagnosis not present

## 2024-08-19 DIAGNOSIS — I120 Hypertensive chronic kidney disease with stage 5 chronic kidney disease or end stage renal disease: Secondary | ICD-10-CM | POA: Diagnosis not present

## 2024-08-19 DIAGNOSIS — N1831 Chronic kidney disease, stage 3a: Secondary | ICD-10-CM | POA: Diagnosis present

## 2024-08-19 DIAGNOSIS — I2510 Atherosclerotic heart disease of native coronary artery without angina pectoris: Secondary | ICD-10-CM | POA: Diagnosis present

## 2024-08-19 DIAGNOSIS — I6389 Other cerebral infarction: Secondary | ICD-10-CM | POA: Diagnosis not present

## 2024-08-19 DIAGNOSIS — Y636 Underdosing and nonadministration of necessary drug, medicament or biological substance: Secondary | ICD-10-CM | POA: Diagnosis present

## 2024-08-19 DIAGNOSIS — I959 Hypotension, unspecified: Secondary | ICD-10-CM | POA: Diagnosis present

## 2024-08-19 DIAGNOSIS — E1122 Type 2 diabetes mellitus with diabetic chronic kidney disease: Secondary | ICD-10-CM | POA: Diagnosis present

## 2024-08-19 DIAGNOSIS — Z79624 Long term (current) use of inhibitors of nucleotide synthesis: Secondary | ICD-10-CM | POA: Diagnosis not present

## 2024-08-19 DIAGNOSIS — T45516A Underdosing of anticoagulants, initial encounter: Secondary | ICD-10-CM | POA: Diagnosis present

## 2024-08-19 DIAGNOSIS — Z7901 Long term (current) use of anticoagulants: Secondary | ICD-10-CM

## 2024-08-19 DIAGNOSIS — I4891 Unspecified atrial fibrillation: Secondary | ICD-10-CM | POA: Diagnosis not present

## 2024-08-19 DIAGNOSIS — Z7984 Long term (current) use of oral hypoglycemic drugs: Secondary | ICD-10-CM

## 2024-08-19 DIAGNOSIS — D649 Anemia, unspecified: Secondary | ICD-10-CM | POA: Diagnosis not present

## 2024-08-19 DIAGNOSIS — Z7951 Long term (current) use of inhaled steroids: Secondary | ICD-10-CM | POA: Diagnosis not present

## 2024-08-19 DIAGNOSIS — K449 Diaphragmatic hernia without obstruction or gangrene: Secondary | ICD-10-CM | POA: Diagnosis present

## 2024-08-19 DIAGNOSIS — I63441 Cerebral infarction due to embolism of right cerebellar artery: Secondary | ICD-10-CM

## 2024-08-19 DIAGNOSIS — Z8673 Personal history of transient ischemic attack (TIA), and cerebral infarction without residual deficits: Secondary | ICD-10-CM | POA: Diagnosis not present

## 2024-08-19 DIAGNOSIS — G8191 Hemiplegia, unspecified affecting right dominant side: Secondary | ICD-10-CM | POA: Diagnosis present

## 2024-08-19 DIAGNOSIS — I1311 Hypertensive heart and chronic kidney disease without heart failure, with stage 5 chronic kidney disease, or end stage renal disease: Secondary | ICD-10-CM | POA: Diagnosis present

## 2024-08-19 DIAGNOSIS — E669 Obesity, unspecified: Secondary | ICD-10-CM | POA: Diagnosis present

## 2024-08-19 DIAGNOSIS — Z79899 Other long term (current) drug therapy: Secondary | ICD-10-CM

## 2024-08-19 DIAGNOSIS — I639 Cerebral infarction, unspecified: Principal | ICD-10-CM | POA: Diagnosis present

## 2024-08-19 DIAGNOSIS — I480 Paroxysmal atrial fibrillation: Secondary | ICD-10-CM | POA: Diagnosis present

## 2024-08-19 DIAGNOSIS — I63541 Cerebral infarction due to unspecified occlusion or stenosis of right cerebellar artery: Secondary | ICD-10-CM | POA: Diagnosis present

## 2024-08-19 DIAGNOSIS — R4701 Aphasia: Secondary | ICD-10-CM | POA: Diagnosis present

## 2024-08-19 DIAGNOSIS — I428 Other cardiomyopathies: Secondary | ICD-10-CM | POA: Diagnosis present

## 2024-08-19 DIAGNOSIS — Z833 Family history of diabetes mellitus: Secondary | ICD-10-CM | POA: Diagnosis not present

## 2024-08-19 DIAGNOSIS — Z8719 Personal history of other diseases of the digestive system: Secondary | ICD-10-CM

## 2024-08-19 DIAGNOSIS — Z940 Kidney transplant status: Secondary | ICD-10-CM

## 2024-08-19 DIAGNOSIS — R471 Dysarthria and anarthria: Secondary | ICD-10-CM | POA: Diagnosis present

## 2024-08-19 DIAGNOSIS — R29810 Facial weakness: Secondary | ICD-10-CM | POA: Diagnosis present

## 2024-08-19 DIAGNOSIS — E039 Hypothyroidism, unspecified: Secondary | ICD-10-CM | POA: Diagnosis present

## 2024-08-19 DIAGNOSIS — Z8249 Family history of ischemic heart disease and other diseases of the circulatory system: Secondary | ICD-10-CM

## 2024-08-19 DIAGNOSIS — R29703 NIHSS score 3: Secondary | ICD-10-CM | POA: Diagnosis not present

## 2024-08-19 DIAGNOSIS — E538 Deficiency of other specified B group vitamins: Secondary | ICD-10-CM | POA: Diagnosis present

## 2024-08-19 DIAGNOSIS — Q283 Other malformations of cerebral vessels: Secondary | ICD-10-CM | POA: Diagnosis not present

## 2024-08-19 DIAGNOSIS — N186 End stage renal disease: Secondary | ICD-10-CM | POA: Diagnosis not present

## 2024-08-19 DIAGNOSIS — K219 Gastro-esophageal reflux disease without esophagitis: Secondary | ICD-10-CM | POA: Diagnosis present

## 2024-08-19 LAB — CBC
HCT: 24.6 % — ABNORMAL LOW (ref 39.0–52.0)
Hemoglobin: 7.5 g/dL — ABNORMAL LOW (ref 13.0–17.0)
MCH: 29.4 pg (ref 26.0–34.0)
MCHC: 30.5 g/dL (ref 30.0–36.0)
MCV: 96.5 fL (ref 80.0–100.0)
Platelets: 164 K/uL (ref 150–400)
RBC: 2.55 MIL/uL — ABNORMAL LOW (ref 4.22–5.81)
RDW: 16.3 % — ABNORMAL HIGH (ref 11.5–15.5)
WBC: 6.2 K/uL (ref 4.0–10.5)
nRBC: 0 % (ref 0.0–0.2)

## 2024-08-19 LAB — TSH: TSH: 69.5 u[IU]/mL — ABNORMAL HIGH (ref 0.350–4.500)

## 2024-08-19 LAB — DIFFERENTIAL
Abs Immature Granulocytes: 0.03 K/uL (ref 0.00–0.07)
Basophils Absolute: 0 K/uL (ref 0.0–0.1)
Basophils Relative: 0 %
Eosinophils Absolute: 0.1 K/uL (ref 0.0–0.5)
Eosinophils Relative: 2 %
Immature Granulocytes: 1 %
Lymphocytes Relative: 16 %
Lymphs Abs: 1 K/uL (ref 0.7–4.0)
Monocytes Absolute: 0.5 K/uL (ref 0.1–1.0)
Monocytes Relative: 8 %
Neutro Abs: 4.6 K/uL (ref 1.7–7.7)
Neutrophils Relative %: 73 %

## 2024-08-19 LAB — PROTIME-INR
INR: 1.2 (ref 0.8–1.2)
Prothrombin Time: 15.5 s — ABNORMAL HIGH (ref 11.4–15.2)

## 2024-08-19 LAB — LIPID PANEL
Cholesterol: 101 mg/dL (ref 0–200)
HDL: 38 mg/dL — ABNORMAL LOW
LDL Cholesterol: 41 mg/dL (ref 0–99)
Total CHOL/HDL Ratio: 2.7 ratio
Triglycerides: 110 mg/dL
VLDL: 22 mg/dL (ref 0–40)

## 2024-08-19 LAB — MAGNESIUM: Magnesium: 2.2 mg/dL (ref 1.7–2.4)

## 2024-08-19 LAB — COMPREHENSIVE METABOLIC PANEL WITH GFR
ALT: 9 U/L (ref 0–44)
AST: 20 U/L (ref 15–41)
Albumin: 3.3 g/dL — ABNORMAL LOW (ref 3.5–5.0)
Alkaline Phosphatase: 43 U/L (ref 38–126)
Anion gap: 11 (ref 5–15)
BUN: 10 mg/dL (ref 8–23)
CO2: 17 mmol/L — ABNORMAL LOW (ref 22–32)
Calcium: 7.1 mg/dL — ABNORMAL LOW (ref 8.9–10.3)
Chloride: 114 mmol/L — ABNORMAL HIGH (ref 98–111)
Creatinine, Ser: 1.22 mg/dL (ref 0.61–1.24)
GFR, Estimated: >60 mL/min
Glucose, Bld: 91 mg/dL (ref 70–99)
Potassium: 3.8 mmol/L (ref 3.5–5.1)
Sodium: 141 mmol/L (ref 135–145)
Total Bilirubin: 0.2 mg/dL (ref 0.0–1.2)
Total Protein: 4.9 g/dL — ABNORMAL LOW (ref 6.5–8.1)

## 2024-08-19 LAB — URINE DRUG SCREEN
Amphetamines: NEGATIVE
Barbiturates: NEGATIVE
Benzodiazepines: NEGATIVE
Cocaine: NEGATIVE
Fentanyl: NEGATIVE
Methadone Scn, Ur: NEGATIVE
Opiates: NEGATIVE
Tetrahydrocannabinol: NEGATIVE

## 2024-08-19 LAB — I-STAT CHEM 8, ED
BUN: 9 mg/dL (ref 8–23)
Calcium, Ion: 0.93 mmol/L — ABNORMAL LOW (ref 1.15–1.40)
Chloride: 113 mmol/L — ABNORMAL HIGH (ref 98–111)
Creatinine, Ser: 1.3 mg/dL — ABNORMAL HIGH (ref 0.61–1.24)
Glucose, Bld: 90 mg/dL (ref 70–99)
HCT: 23 % — ABNORMAL LOW (ref 39.0–52.0)
Hemoglobin: 7.8 g/dL — ABNORMAL LOW (ref 13.0–17.0)
Potassium: 3.6 mmol/L (ref 3.5–5.1)
Sodium: 143 mmol/L (ref 135–145)
TCO2: 18 mmol/L — ABNORMAL LOW (ref 22–32)

## 2024-08-19 LAB — HEMOGLOBIN A1C
Hgb A1c MFr Bld: 5.8 % — ABNORMAL HIGH (ref 4.8–5.6)
Mean Plasma Glucose: 119.76 mg/dL

## 2024-08-19 LAB — T4, FREE: Free T4: 0.32 ng/dL — ABNORMAL LOW (ref 0.80–2.00)

## 2024-08-19 LAB — APTT: aPTT: 38 s — ABNORMAL HIGH (ref 24–36)

## 2024-08-19 LAB — CBG MONITORING, ED: Glucose-Capillary: 108 mg/dL — ABNORMAL HIGH (ref 70–99)

## 2024-08-19 LAB — ETHANOL: Alcohol, Ethyl (B): <15 mg/dL

## 2024-08-19 MED ORDER — BRIMONIDINE TARTRATE 0.2 % OP SOLN
1.0000 [drp] | Freq: Two times a day (BID) | OPHTHALMIC | Status: DC
  Administered 2024-08-20 – 2024-08-21 (×3): 1 [drp] via OPHTHALMIC
  Filled 2024-08-19: qty 5

## 2024-08-19 MED ORDER — AMIODARONE HCL 200 MG PO TABS
200.0000 mg | ORAL_TABLET | Freq: Every day | ORAL | Status: DC
  Administered 2024-08-20 – 2024-08-21 (×2): 200 mg via ORAL
  Filled 2024-08-19 (×3): qty 1

## 2024-08-19 MED ORDER — ACETAMINOPHEN 325 MG PO TABS: 650.0000 mg | ORAL_TABLET | ORAL | Status: DC | PRN

## 2024-08-19 MED ORDER — LATANOPROST 0.005 % OP SOLN
1.0000 [drp] | Freq: Every day | OPHTHALMIC | Status: DC
  Administered 2024-08-19 – 2024-08-20 (×2): 1 [drp] via OPHTHALMIC
  Filled 2024-08-19: qty 2.5

## 2024-08-19 MED ORDER — TACROLIMUS 1 MG PO CAPS
3.0000 mg | ORAL_CAPSULE | Freq: Every morning | ORAL | Status: DC
  Administered 2024-08-20 – 2024-08-21 (×2): 3 mg via ORAL
  Filled 2024-08-19 (×2): qty 3

## 2024-08-19 MED ORDER — HYDRALAZINE HCL 20 MG/ML IJ SOLN: 10.0000 mg | Freq: Four times a day (QID) | INTRAMUSCULAR | Status: AC | PRN

## 2024-08-19 MED ORDER — ATORVASTATIN CALCIUM 10 MG PO TABS
20.0000 mg | ORAL_TABLET | Freq: Every day | ORAL | Status: DC
  Administered 2024-08-19 – 2024-08-20 (×2): 20 mg via ORAL
  Filled 2024-08-19 (×2): qty 2

## 2024-08-19 MED ORDER — ACETAMINOPHEN 160 MG/5ML PO SOLN: 650.0000 mg | ORAL | Status: DC | PRN

## 2024-08-19 MED ORDER — ACETAMINOPHEN 650 MG RE SUPP: 650.0000 mg | RECTAL | Status: DC | PRN

## 2024-08-19 MED ORDER — MYCOPHENOLATE 200 MG/ML ORAL SUSPENSION
1000.0000 mg | Freq: Two times a day (BID) | ORAL | Status: DC
  Administered 2024-08-19 – 2024-08-21 (×4): 1000 mg via ORAL
  Filled 2024-08-19 (×7): qty 5

## 2024-08-19 MED ORDER — BRIMONIDINE TARTRATE-TIMOLOL 0.2-0.5 % OP SOLN
1.0000 [drp] | Freq: Two times a day (BID) | OPHTHALMIC | Status: DC
  Filled 2024-08-19: qty 5

## 2024-08-19 MED ORDER — TAMSULOSIN HCL 0.4 MG PO CAPS
0.4000 mg | ORAL_CAPSULE | Freq: Every day | ORAL | Status: DC
  Administered 2024-08-19 – 2024-08-20 (×2): 0.4 mg via ORAL
  Filled 2024-08-19 (×2): qty 1

## 2024-08-19 MED ORDER — SULFAMETHOXAZOLE-TRIMETHOPRIM 200-40 MG/5ML PO SUSP
10.0000 mL | ORAL | Status: DC
  Administered 2024-08-19: 10 mL via ORAL
  Filled 2024-08-19: qty 10

## 2024-08-19 MED ORDER — SUCRALFATE 1 GM/10ML PO SUSP
1.0000 g | Freq: Three times a day (TID) | ORAL | Status: DC
  Administered 2024-08-19 – 2024-08-21 (×5): 1 g via ORAL
  Filled 2024-08-19 (×5): qty 10

## 2024-08-19 MED ORDER — STROKE: EARLY STAGES OF RECOVERY BOOK
Freq: Once | Status: AC
  Filled 2024-08-19: qty 1

## 2024-08-19 MED ORDER — IOHEXOL 350 MG/ML SOLN
75.0000 mL | Freq: Once | INTRAVENOUS | Status: AC | PRN
  Administered 2024-08-19: 75 mL via INTRAVENOUS

## 2024-08-19 MED ORDER — TIMOLOL MALEATE 0.5 % OP SOLN
1.0000 [drp] | Freq: Two times a day (BID) | OPHTHALMIC | Status: DC
  Administered 2024-08-20 – 2024-08-21 (×3): 1 [drp] via OPHTHALMIC
  Filled 2024-08-19: qty 5

## 2024-08-19 MED ORDER — PANTOPRAZOLE SODIUM 40 MG IV SOLR
40.0000 mg | Freq: Two times a day (BID) | INTRAVENOUS | Status: DC
  Administered 2024-08-19 – 2024-08-21 (×4): 40 mg via INTRAVENOUS
  Filled 2024-08-19 (×5): qty 10

## 2024-08-19 MED ORDER — PREDNISONE 5 MG/5ML PO SOLN
5.0000 mg | Freq: Every day | ORAL | Status: DC
  Administered 2024-08-19: 5 mg via ORAL
  Filled 2024-08-19 (×2): qty 5

## 2024-08-19 MED ORDER — TACROLIMUS 1 MG PO CAPS
2.0000 mg | ORAL_CAPSULE | Freq: Every day | ORAL | Status: DC
  Administered 2024-08-19 – 2024-08-20 (×2): 2 mg via ORAL
  Filled 2024-08-19 (×2): qty 2

## 2024-08-19 MED ORDER — TACROLIMUS 1 MG PO CAPS: 2.0000 mg | ORAL_CAPSULE | ORAL | Status: DC

## 2024-08-19 MED ORDER — ALBUTEROL SULFATE (2.5 MG/3ML) 0.083% IN NEBU: 3.0000 mL | INHALATION_SOLUTION | Freq: Four times a day (QID) | RESPIRATORY_TRACT | Status: DC | PRN

## 2024-08-19 MED ORDER — SODIUM CHLORIDE 0.9 % IV SOLN: INTRAVENOUS | Status: AC

## 2024-08-19 NOTE — ED Notes (Signed)
Floor notified.

## 2024-08-19 NOTE — Consult Note (Signed)
 NEUROLOGY CONSULT NOTE   Date of service: August 19, 2024 Patient Name: Jon [name] MRN:  [id number] DOB:  [birth year] Chief Complaint: Code stroke Requesting Provider: Ruthe Cornet, DO  History of Present Illness  Jon [name] is a 69 y.o. male with hx of CKD s/p transplant, GERD, LD, HTN, GERD, NICM with severe LVH, and previous MI presenting from his PCP office.  He was admitted for a GI bleed last week (1/9-1/14) and paused his Eliquis .  At approximately 1208 today at his doctor's office he began having dysarthria and right-sided weakness.  He has not yet resumed his Eliquis . He is not currently having any rectal or upper GI bleeding.  TEE done September 2025 that showed EF 50-55%, severe left atrial dilation, and severe left ventricular hypertrophy.  He currently follows with Dr. Barbaraann at [facility].  LKW: 1208 Modified rankin score: 0-Completely asymptomatic and back to baseline post- stroke IV Thrombolysis: No, recent GI bleed  EVT: No, no LVO    NIHSS components Score: Comment  1a Level of Conscious 0[x]  1[]  2[]  3[]      1b LOC Questions 0[x]  1[]  2[]       1c LOC Commands 0[x]  1[]  2[]       2 Best Gaze 0[x]  1[]  2[]       3 Visual 0[x]  1[]  2[]  3[]      4 Facial Palsy 0[x]  1[]  2[]  3[]      5a Motor Arm - left 0[x]  1[]  2[]  3[]  4[]  UN[]    5b Motor Arm - Right 0[x]  1[]  2[]  3[]  4[]  UN[]    6a Motor Leg - Left 0[x]  1[]  2[]  3[]  4[]  UN[]    6b Motor Leg - Right 0[]  1[x]  2[]  3[]  4[]  UN[]    7 Limb Ataxia 0[x]  1[]  2[]  UN[]      8 Sensory 0[]  1[x]  2[]  UN[]      9 Best Language 0[x]  1[]  2[]  3[]      10 Dysarthria 0[]  1[x]  2[]  UN[]      11 Extinct. and Inattention 0[x]  1[]  2[]       TOTAL:3       ROS  Comprehensive ROS performed and pertinent positives documented in HPI   Past History   Past Medical History:  Diagnosis Date   Chronic kidney disease    Dialysis patient    Dyspnea    GERD (gastroesophageal reflux disease)    History of blood transfusion    Hyperlipidemia     takes Atorvastatin  daily   Hypertension    takes Amlodipine ,Losartan ,and Labetalol  daily   Myocardial infarction [facility])    lived in [place]  maybe 7 years ago   Small bowel obstruction (HCC)     Past Surgical History:  Procedure Laterality Date   ARTERIOVENOUS GRAFT PLACEMENT     AV FISTULA PLACEMENT Right 10/25/2015   Procedure: ARTERIOVENOUS (AV) FISTULA CREATION RIGHT FOREARM;  Surgeon: Lonni GORMAN Blade, MD;  Location: [facility] OR;  Service: Vascular;  Laterality: Right;   BOTOX  INJECTION N/A 08/07/2021   Procedure: cystoscopy BOTOX  INJECTION;  Surgeon: Carolee Sherwood [name] DOUGLAS, MD;  Location: [facility] ORS;  Service: Urology;  Laterality: N/A;   CARDIAC CATHETERIZATION N/A 05/05/2015   Procedure: Left Heart Cath and Coronary Angiography;  Surgeon: Rober Chroman, MD;  Location: [facility] INVASIVE CV LAB;  Service: Cardiovascular;  Laterality: N/A;   CARDIOVERSION N/A 04/16/2024   Procedure: CARDIOVERSION;  Surgeon: Loni Soyla DELENA, MD;  Location: [facility] INVASIVE CV LAB;  Service: Cardiovascular;  Laterality: N/A;   ESOPHAGOGASTRODUODENOSCOPY N/A 08/08/2024   Procedure: EGD (ESOPHAGOGASTRODUODENOSCOPY);  Surgeon: Abran Norleen SAILOR, MD;  Location: [facility];  Service: Gastroenterology;  Laterality: N/A;   HEMOSTASIS CLIP PLACEMENT  08/08/2024   Procedure: CONTROL OF HEMORRHAGE, GI TRACT, ENDOSCOPIC, BY CLIPPING OR OVERSEWING;  Surgeon: Abran Norleen SAILOR, MD;  Location: [facility];  Service: Gastroenterology;;   LIGATION OF ARTERIOVENOUS  FISTULA Left 01/06/2019   Procedure: EXCISION OF ANEURYSMAL  ARTERIOVENOUS  FISTULA LEFT ARM;  Surgeon: Eliza Lonni RAMAN, MD;  Location: [facility] OR;  Service: Vascular;  Laterality: Left;   PERIPHERAL VASCULAR CATHETERIZATION N/A 08/18/2015   Procedure: Fistulagram;  Surgeon: Redell LITTIE Door, MD;  Location: [facility] INVASIVE CV LAB;  Service: Cardiovascular;  Laterality: N/A;   PERIPHERAL VASCULAR CATHETERIZATION Right 04/19/2016   Procedure: Fistulagram;  Surgeon: Redell LITTIE Door, MD;  Location: [facility]  INVASIVE CV LAB;  Service: Cardiovascular;  Laterality: Right;   RESECTION OF ARTERIOVENOUS FISTULA ANEURYSM Left 02/05/2013   Procedure: REPAIR OF ANEURYSM OF LEFT ARM ARTERIOVENOUS FISTULA ;  Surgeon: Gaile LELON New, MD;  Location: [facility] OR;  Service: Vascular;  Laterality: Left;   RESECTION OF ARTERIOVENOUS FISTULA ANEURYSM Left 03/08/2015   Procedure: REPAIR OF LEFT ARTERIOVENOUS FISTULA PSEUDOANEURYSM;  Surgeon: Lonni RAMAN Eliza, MD;  Location: [facility] OR;  Service: Vascular;  Laterality: Left;   REVISON OF ARTERIOVENOUS FISTULA Left 06/02/2015   Procedure: PLICATION OF A LARGE ANEURYSM LEFT UPPER ARM BRACHIO-CEPHALIC ARTERIOVENOUS FISTULA ;  Surgeon: Lonni RAMAN Eliza, MD;  Location: [facility] OR;  Service: Vascular;  Laterality: Left;   SCLEROTHERAPY  08/08/2024   Procedure: MATIAS;  Surgeon: Abran Norleen SAILOR, MD;  Location: [facility];  Service: Gastroenterology;;   SMALL INTESTINE SURGERY     TRANSESOPHAGEAL ECHOCARDIOGRAM (CATH LAB) N/A 04/16/2024   Procedure: TRANSESOPHAGEAL ECHOCARDIOGRAM;  Surgeon: Loni Soyla LABOR, MD;  Location: [facility] INVASIVE CV LAB;  Service: Cardiovascular;  Laterality: N/A;    Family History: Family History  Problem Relation Age of Onset   Hypertension Mother    Other Mother        varicose veins   Aneurysm Mother    Diabetes Father    Hypertension Father    Heart attack Father    Hypertension Sister    Other Sister        varicose veins   Other Brother        varicose veins    Social History  reports that he has never smoked. He has never used smokeless tobacco. He reports that he does not drink alcohol and does not use drugs.  Allergies[1]  Medications  Current Medications[2]  Vitals   Vitals:   08/19/24 1300  Weight: 112.4 kg    Body mass index is 33.61 kg/m.   Physical Exam   Constitutional: Appears well-developed and well-nourished.  Psych: Affect appropriate to situation.  Eyes: No scleral injection.  HENT: No OP obstruction.   Head: Normocephalic.  Cardiovascular: Normal rate and regular rhythm.  Respiratory: Effort normal, non-labored breathing.  GI: Soft.  No distension. There is no tenderness.  Skin: WDI.   Neurologic Examination    Neuro: Mental Status: Patient is awake, alert, oriented to person, place, month, year, and situation. Speech is mildly dysarthric  No signs of aphasia or neglect Cranial Nerves: II: Visual Fields are full. Pupils are equal, round, and reactive to light.   III,IV, VI: EOMI without ptosis or diploplia.  V: Facial sensation is symmetric to temperature VII: Facial movement is symmetric resting and smiling VIII: Hearing is intact to voice X: Palate elevates symmetrically XI: Shoulder shrug  is symmetric. XII: Tongue protrudes midline without atrophy or fasciculations.  Motor: Tone is normal. Bulk is normal.  Right upper extremity with trace weakness but no drift  RLE with slight drift Sensory: Diminished sensation at RUE Symmetric sensation in BLE Cerebellar: FNF and HKS are intact bilaterally   Labs/Imaging/Neurodiagnostic studies   CBC:  Recent Labs  Lab 2024-09-15 1323 15-Sep-2024 1325  WBC  --  6.2  NEUTROABS  --  4.6  HGB 7.8* 7.5*  HCT 23.0* 24.6*  MCV  --  96.5  PLT  --  164   Basic Metabolic Panel:  Lab Results  Component Value Date   NA 141 15-Sep-2024   K 3.8 2024/09/15   CO2 17 (L) 09/15/24   GLUCOSE 91 September 15, 2024   BUN 10 Sep 15, 2024   CREATININE 1.22 09/15/24   CALCIUM  7.1 (L) Sep 15, 2024   GFRNONAA >60 09-15-24   GFRAA 72 05/01/2019   Lipid Panel:  Lab Results  Component Value Date   LDLCALC 60 08/14/2023   Alcohol Level     Component Value Date/Time   ETH <15 15-Sep-2024 1325   INR  Lab Results  Component Value Date   INR 1.2 09/15/24   APTT  Lab Results  Component Value Date   APTT 38 (H) 2024/09/15    CT Head without contrast(Personally reviewed): No acute intracranial abnormality. ASPECTS 10. Remote lacunar  infarct in the left centrum semiovale.  CT angio Head and Neck with contrast(Personally reviewed): Significant distal tapering of the basilar artery, essentially terminating at the origins of the superior cerebellar arteries, with the posterior cerebral arteries mainly supplied via the posterior communicating arteries. Small caliber bilateral vertebral arteries with limited evaluation of the V1 and proximal V2 segments due to streak artifact.  MRI Brain(Personally reviewed): On personal review appears to have an acute right SCA territory infarct    ASSESSMENT   Jon [name] is a 69 y.o. male  hx of CKD s/p transplant, GERD, LD, HTN, GERD, NICM with severe LVH, and previous MI presenting from his PCP office after an acute onset of right sided weakness, dysarthria, and right arm numbness. He stopped his eliquis  earlier this month due to a GI bleed. He has not yet resumed it. MRI shows a right SCA territory infarct on personal review.  Likely cardioembolic in the setting of not being on anticoagulation due to GI bleed, and underlying atrial fibrillation. Not a candidate for IV thrombolysis due to recent GI bleed requiring transfusions Not a candidate for endovascular thrombectomy due to new ELVO.  Impression: Acute ischemic stroke  RECOMMENDATIONS  -Admit to hospitalist - Frequent neurochecks -Telemetry -2D echo -A1c -Lipid panel -From a stroke standpoint, should be okay to resume Eliquis  in 2 to 3 days but he is on hold for his Eliquis  due to GI bleed.  Would resume Eliquis  anytime after 2 to 3 days when okay with GI -Therapy assessments -Permissive hypertension treat only if systolic is greater than 220 on a as needed basis  Stroke team to follow Plan relayed to Dr. Tobie, admitting hospitalist  ______________________________________________________________________    Signed, Jorene Last, NP [facility]     [1] No Known Allergies [2] No current  facility-administered medications for this encounter.  Current Outpatient Medications:    albuterol  (PROVENTIL  HFA;VENTOLIN  HFA) 108 (90 Base) MCG/ACT inhaler, Inhale 2 puffs into the lungs every 6 (six) hours as needed for wheezing or shortness of breath., Disp: 1 Inhaler, Rfl: 2   amiodarone  (PACERONE ) 200 MG tablet, Take  1 tablet (200 mg total) by mouth daily., Disp: 90 tablet, Rfl: 3   apixaban  (ELIQUIS ) 5 MG TABS tablet, Take 1 tablet (5 mg total) by mouth 2 (two) times daily., Disp: 180 tablet, Rfl: 3   atorvastatin  (LIPITOR) 20 MG tablet, Take 1 tablet (20 mg total) by mouth at bedtime., Disp: 90 tablet, Rfl: 3   brimonidine -timolol  (COMBIGAN ) 0.2-0.5 % ophthalmic solution, Place 1 drop into both eyes every 12 (twelve) hours., Disp: , Rfl:    calcium  carbonate (TUMS - DOSED IN MG ELEMENTAL CALCIUM ) 500 MG chewable tablet, Chew 1 tablet (200 mg of elemental calcium  total) by mouth 3 (three) times daily as needed for indigestion or heartburn., Disp: 90 tablet, Rfl: 0   empagliflozin  (JARDIANCE ) 10 MG TABS tablet, Take 1 tablet (10 mg total) by mouth daily before breakfast., Disp: 30 tablet, Rfl: 3   GEMTESA 75 MG TABS, Take 1 tablet by mouth daily., Disp: , Rfl:    hydrALAZINE  (APRESOLINE ) 100 MG tablet, Take 1 tablet (100 mg total) by mouth every 8 (eight) hours. (Patient taking differently: Take 100 mg by mouth 3 (three) times daily.), Disp: 90 tablet, Rfl: 3   magnesium  oxide (MAG-OX) 400 MG tablet, Take 800 mg by mouth 2 (two) times daily., Disp: , Rfl:    metoprolol  tartrate (LOPRESSOR ) 100 MG tablet, Take 1 tablet (100 mg total) by mouth 2 (two) times daily., Disp: 180 tablet, Rfl: 3   mycophenolate  (CELLCEPT ) 200 MG/ML suspension, Take 1,000 mg by mouth 2 (two) times a day., Disp: , Rfl:    pantoprazole  (PROTONIX ) 40 MG tablet, Take 1 tablet (40 mg total) by mouth daily., Disp: 90 tablet, Rfl: 0   predniSONE  5 MG/5ML solution, Take 5 mg by mouth daily., Disp: , Rfl:    spironolactone   (ALDACTONE ) 25 MG tablet, Take 0.5 tablet (12.5 mg total) by mouth daily., Disp: 45 tablet, Rfl: 3   sucralfate  (CARAFATE ) 1 GM/10ML suspension, Take 10 mLs (1 g total) by mouth 3 (three) times daily before meals., Disp: 420 mL, Rfl: 0   sulfamethoxazole -trimethoprim  (BACTRIM ) 200-40 MG/5ML suspension, Take 10 mLs by mouth 3 (three) times a week. Monday, Wednesday, Friday, Disp: , Rfl:    tacrolimus  (PROGRAF ) 1 MG capsule, Take 2-3 mg by mouth See admin instructions. Take 3 mg by mouth in the morning and 2 mg at bedtime, Disp: , Rfl:    tamsulosin  (FLOMAX ) 0.4 MG CAPS capsule, Take 0.4 mg by mouth at bedtime., Disp: , Rfl:    VYZULTA  0.024 % SOLN, Place 1 drop into both eyes at bedtime., Disp: , Rfl:

## 2024-08-19 NOTE — H&P (Signed)
 " History and Physical    Patient: Jon [name]:[id number] DOB: [birth year] DOA: 08/19/2024 DOS: the patient was seen and examined on 08/19/2024 . [facility]: [facility] [address]  Patient coming from: PCP office.  Chief complaint: Chief Complaint  Patient presents with   Aphasia   Dizziness   Numbness   Tingling   HPI:  Jon [name] is a 69 y.o. male with past medical history  of essential hypertension, nonischemic cardiomyopathy with severe LVH, history of CAD, history of acute blood loss anemia and GI bleed, CKD status post cadaveric transplant,coming from pcp office for right eye blurred vision and right leg weakness when at his pcp office where he had bloodwork today at 12:08 pm.  At bedside pt is alert,awake, oriented and nonfocal.  Pt denies any complaints of chest pain, sob palpitation bleeding or any complaints. He does report he was dizzy today am.   ED Course:  Vital signs in the ED were notable for the following:  Vitals:   08/19/24 1300 08/19/24 1430 08/19/24 1434  BP:   (!) 144/88  Pulse:  (!) 56   Temp:   98 F (36.7 C)  Resp:  19   Weight: 112.4 kg    SpO2:  100%   TempSrc:   Oral  BMI (Calculated): 33.6     >>ED evaluation thus far shows: EKG ordered for today.  Initial CMP showing bicarb of 17 calcium  7.1 normal LFTs albumin  3.3 CBC shows white count of 6.2 hemoglobin of 7.5 platelets of 164. Initial head CT showed remote lacunar infarct in the left centrum semiovale. CTA head and neck shows no LVO or hemodynamically significant stenosis. MRI of the brain noncontrast shows right cerebellar hemisphere acute infarct and moderate left maxillary sinus disease.  >>While in the ED patient received the following: Medications  iohexol  (OMNIPAQUE ) 350 MG/ML injection 75 mL (75 mLs Intravenous Contrast Given 08/19/24 1334)   Review of Systems  All other systems reviewed and are negative.  Past Medical History:  Diagnosis Date   Chronic kidney disease    Dialysis  patient    Dyspnea    GERD (gastroesophageal reflux disease)    History of blood transfusion    Hyperlipidemia    takes Atorvastatin  daily   Hypertension    takes Amlodipine ,Losartan ,and Labetalol  daily   Myocardial infarction [facility])    lived in [place]  maybe 7 years ago   Small bowel obstruction [facility])    Past Surgical History:  Procedure Laterality Date   ARTERIOVENOUS GRAFT PLACEMENT     AV FISTULA PLACEMENT Right 10/25/2015   Procedure: ARTERIOVENOUS (AV) FISTULA CREATION RIGHT FOREARM;  Surgeon: Lonni GORMAN Blade, MD;  Location: [facility] OR;  Service: Vascular;  Laterality: Right;   BOTOX  INJECTION N/A 08/07/2021   Procedure: cystoscopy BOTOX  INJECTION;  Surgeon: Carolee Sherwood [name] DOUGLAS, MD;  Location: [facility] ORS;  Service: Urology;  Laterality: N/A;   CARDIAC CATHETERIZATION N/A 05/05/2015   Procedure: Left Heart Cath and Coronary Angiography;  Surgeon: Rober Chroman, MD;  Location: [facility] INVASIVE CV LAB;  Service: Cardiovascular;  Laterality: N/A;   CARDIOVERSION N/A 04/16/2024   Procedure: CARDIOVERSION;  Surgeon: Loni Soyla DELENA, MD;  Location: [facility] INVASIVE CV LAB;  Service: Cardiovascular;  Laterality: N/A;   ESOPHAGOGASTRODUODENOSCOPY N/A 08/08/2024   Procedure: EGD (ESOPHAGOGASTRODUODENOSCOPY);  Surgeon: Abran Norleen SAILOR, MD;  Location: [facility];  Service: Gastroenterology;  Laterality: N/A;   HEMOSTASIS CLIP PLACEMENT  08/08/2024   Procedure: CONTROL OF HEMORRHAGE, GI TRACT, ENDOSCOPIC, BY CLIPPING OR  OVERSEWING;  Surgeon: Abran Norleen SAILOR, MD;  Location: [facility];  Service: Gastroenterology;;   LIGATION OF ARTERIOVENOUS  FISTULA Left 01/06/2019   Procedure: EXCISION OF ANEURYSMAL  ARTERIOVENOUS  FISTULA LEFT ARM;  Surgeon: Eliza Lonni RAMAN, MD;  Location: [facility] OR;  Service: Vascular;  Laterality: Left;   PERIPHERAL VASCULAR CATHETERIZATION N/A 08/18/2015   Procedure: Fistulagram;  Surgeon: Redell LITTIE Door, MD;  Location: [facility] INVASIVE CV LAB;  Service: Cardiovascular;  Laterality: N/A;    PERIPHERAL VASCULAR CATHETERIZATION Right 04/19/2016   Procedure: Fistulagram;  Surgeon: Redell LITTIE Door, MD;  Location: [facility] INVASIVE CV LAB;  Service: Cardiovascular;  Laterality: Right;   RESECTION OF ARTERIOVENOUS FISTULA ANEURYSM Left 02/05/2013   Procedure: REPAIR OF ANEURYSM OF LEFT ARM ARTERIOVENOUS FISTULA ;  Surgeon: Gaile LELON New, MD;  Location: [facility] OR;  Service: Vascular;  Laterality: Left;   RESECTION OF ARTERIOVENOUS FISTULA ANEURYSM Left 03/08/2015   Procedure: REPAIR OF LEFT ARTERIOVENOUS FISTULA PSEUDOANEURYSM;  Surgeon: Lonni RAMAN Eliza, MD;  Location: [facility] OR;  Service: Vascular;  Laterality: Left;   REVISON OF ARTERIOVENOUS FISTULA Left 06/02/2015   Procedure: PLICATION OF A LARGE ANEURYSM LEFT UPPER ARM BRACHIO-CEPHALIC ARTERIOVENOUS FISTULA ;  Surgeon: Lonni RAMAN Eliza, MD;  Location: [facility] OR;  Service: Vascular;  Laterality: Left;   SCLEROTHERAPY  08/08/2024   Procedure: MATIAS;  Surgeon: Abran Norleen SAILOR, MD;  Location: [facility];  Service: Gastroenterology;;   SMALL INTESTINE SURGERY     TRANSESOPHAGEAL ECHOCARDIOGRAM (CATH LAB) N/A 04/16/2024   Procedure: TRANSESOPHAGEAL ECHOCARDIOGRAM;  Surgeon: Loni Soyla LABOR, MD;  Location: [facility] INVASIVE CV LAB;  Service: Cardiovascular;  Laterality: N/A;    reports that he has never smoked. He has never used smokeless tobacco. He reports that he does not drink alcohol and does not use drugs. Allergies[1] Family History  Problem Relation Age of Onset   Hypertension Mother    Other Mother        varicose veins   Aneurysm Mother    Diabetes Father    Hypertension Father    Heart attack Father    Hypertension Sister    Other Sister        varicose veins   Other Brother        varicose veins   Prior to Admission medications  Medication Sig Start Date End Date Taking? Authorizing Provider  albuterol  (PROVENTIL  HFA;VENTOLIN  HFA) 108 (90 Base) MCG/ACT inhaler Inhale 2 puffs into the lungs every 6 (six) hours as needed for  wheezing or shortness of breath. 01/24/17   Alfornia Madison, MD  amiodarone  (PACERONE ) 200 MG tablet Take 1 tablet (200 mg total) by mouth daily. 05/18/24   Meng, Hao, PA  apixaban  (ELIQUIS ) 5 MG TABS tablet Take 1 tablet (5 mg total) by mouth 2 (two) times daily. 04/18/24   Lelon Hamilton T, PA-C  atorvastatin  (LIPITOR) 20 MG tablet Take 1 tablet (20 mg total) by mouth at bedtime. 05/14/24   Meng, Hao, PA  brimonidine -timolol  (COMBIGAN ) 0.2-0.5 % ophthalmic solution Place 1 drop into both eyes every 12 (twelve) hours.    [provider]  calcium  carbonate (TUMS - DOSED IN MG ELEMENTAL CALCIUM ) 500 MG chewable tablet Chew 1 tablet (200 mg of elemental calcium  total) by mouth 3 (three) times daily as needed for indigestion or heartburn. 08/12/24 09/11/24  Arlice Reichert, MD  empagliflozin  (JARDIANCE ) 10 MG TABS tablet Take 1 tablet (10 mg total) by mouth daily before breakfast. 05/14/24   Meng, Hao, PA  GEMTESA 75 MG TABS  Take 1 tablet by mouth daily. 05/27/24   [provider]  hydrALAZINE  (APRESOLINE ) 100 MG tablet Take 1 tablet (100 mg total) by mouth every 8 (eight) hours. Patient taking differently: Take 100 mg by mouth 3 (three) times daily. 01/06/23   Patsy Lenis, MD  magnesium  oxide (MAG-OX) 400 MG tablet Take 800 mg by mouth 2 (two) times daily. 01/28/24   [provider]  metoprolol  tartrate (LOPRESSOR ) 100 MG tablet Take 1 tablet (100 mg total) by mouth 2 (two) times daily. 04/18/24   Lelon Hamilton T, PA-C  mycophenolate  (CELLCEPT ) 200 MG/ML suspension Take 1,000 mg by mouth 2 (two) times a day. 03/20/18   [provider]  pantoprazole  (PROTONIX ) 40 MG tablet Take 1 tablet (40 mg total) by mouth daily. 08/12/24   Arlice Reichert, MD  predniSONE  5 MG/5ML solution Take 5 mg by mouth daily. 11/18/18   [provider]  spironolactone  (ALDACTONE ) 25 MG tablet Take 0.5 tablet (12.5 mg total) by mouth daily. 04/20/24   Lelon Hamilton T, PA-C  sucralfate  (CARAFATE )  1 GM/10ML suspension Take 10 mLs (1 g total) by mouth 3 (three) times daily before meals. 08/12/24 08/26/24  Arlice Reichert, MD  sulfamethoxazole -trimethoprim  (BACTRIM ) 200-40 MG/5ML suspension Take 10 mLs by mouth 3 (three) times a week. Monday, Wednesday, Friday 07/20/24   [provider]  tacrolimus  (PROGRAF ) 1 MG capsule Take 2-3 mg by mouth See admin instructions. Take 3 mg by mouth in the morning and 2 mg at bedtime    [provider]  tamsulosin  (FLOMAX ) 0.4 MG CAPS capsule Take 0.4 mg by mouth at bedtime. 07/13/24   [provider]  VYZULTA  0.024 % SOLN Place 1 drop into both eyes at bedtime.    [provider]  fluticasone  (FLOVENT  HFA) 44 MCG/ACT inhaler Inhale 1 puff into the lungs 2 (two) times daily. Patient not taking: No sig reported 03/08/17 03/04/21  Shellia Oh, MD                                                                                 Vitals:   08/19/24 1300 08/19/24 1430 08/19/24 1434  BP:   (!) 144/88  Pulse:  (!) 56   Resp:  19   Temp:   98 F (36.7 C)  TempSrc:   Oral  SpO2:  100%   Weight: 112.4 kg     Physical Exam Vitals and nursing note reviewed.  Constitutional:      General: He is not in acute distress.    Appearance: He is obese. He is not ill-appearing.  HENT:     Head: Normocephalic and atraumatic.     Right Ear: Hearing normal.     Left Ear: Hearing normal.     Nose: No nasal deformity.     Mouth/Throat:     Lips: Pink.  Eyes:     General: Lids are normal.     Extraocular Movements: Extraocular movements intact.     Pupils: Pupils are equal, round, and reactive to light.  Cardiovascular:     Rate and Rhythm: Normal rate and regular rhythm.     Heart sounds: Normal heart sounds.  Pulmonary:  Effort: Pulmonary effort is normal.     Breath sounds: Normal breath sounds.  Abdominal:     General: Bowel sounds are normal. There is no distension.     Palpations: Abdomen is soft. There is no mass.      Tenderness: There is no abdominal tenderness.  Musculoskeletal:     Right lower leg: No edema.     Left lower leg: No edema.  Skin:    General: Skin is warm.  Neurological:     General: No focal deficit present.     Mental Status: He is alert and oriented to person, place, and time.     Cranial Nerves: Cranial nerves 2-12 are intact. No cranial nerve deficit, dysarthria or facial asymmetry.     Motor: No weakness, tremor or pronator drift.     Coordination: Finger-Nose-Finger Test and Heel to [place] Test normal.     Deep Tendon Reflexes:     Reflex Scores:      Bicep reflexes are 2+ on the right side and 2+ on the left side.      Patellar reflexes are 2+ on the right side and 2+ on the left side. Psychiatric:        Attention and Perception: Attention normal.        Mood and Affect: Mood normal.        Speech: Speech normal.        Behavior: Behavior is cooperative.     Labs on Admission: I have personally reviewed following labs and imaging studies CBC: Recent Labs  Lab 08/19/24 1323 08/19/24 1325  WBC  --  6.2  NEUTROABS  --  4.6  HGB 7.8* 7.5*  HCT 23.0* 24.6*  MCV  --  96.5  PLT  --  164   Basic Metabolic Panel: Recent Labs  Lab 08/19/24 1323 08/19/24 1325  NA 143 141  K 3.6 3.8  CL 113* 114*  CO2  --  17*  GLUCOSE 90 91  BUN 9 10  CREATININE 1.30* 1.22  CALCIUM   --  7.1*   GFR: Estimated Creatinine Clearance: 77.1 mL/min (by C-G formula based on SCr of 1.22 mg/dL). Liver Function Tests: Recent Labs  Lab 08/19/24 1325  AST 20  ALT 9  ALKPHOS 43  BILITOT 0.2  PROT 4.9*  ALBUMIN  3.3*   No results for input(s): LIPASE, AMYLASE in the last 168 hours. No results for input(s): AMMONIA in the last 168 hours. Recent Labs    08/07/24 1803 08/08/24 0238 08/08/24 0825 08/08/24 1531 08/09/24 0232 08/10/24 0540 08/11/24 0615 08/12/24 0619 08/19/24 1323 08/19/24 1325  BUN 42* 45* 50* 49* 41* 21 15 10 9 10   CREATININE 1.71* 1.90* 2.16* 2.11*  1.95* 1.78* 1.68* 1.54* 1.30* 1.22    Cardiac Enzymes: No results for input(s): CKTOTAL, CKMB, CKMBINDEX, TROPONINI in the last 168 hours. BNP (last 3 results) No results for input(s): PROBNP in the last 8760 hours. HbA1C: No results for input(s): HGBA1C in the last 72 hours. CBG: Recent Labs  Lab 08/19/24 1317  GLUCAP 108*   Lipid Profile: No results for input(s): CHOL, HDL, LDLCALC, TRIG, CHOLHDL, LDLDIRECT in the last 72 hours. Thyroid Function Tests: No results for input(s): TSH, T4TOTAL, FREET4, T3FREE, THYROIDAB in the last 72 hours. Anemia Panel: No results for input(s): VITAMINB12, FOLATE, FERRITIN, TIBC, IRON, RETICCTPCT in the last 72 hours. Urine analysis:    Component Value Date/Time   COLORURINE YELLOW 08/13/2023 1024   APPEARANCEUR HAZY (A) 08/13/2023 1024  LABSPEC 1.009 08/13/2023 1024   PHURINE 8.0 08/13/2023 1024   GLUCOSEU NEGATIVE 08/13/2023 1024   HGBUR NEGATIVE 08/13/2023 1024   BILIRUBINUR NEGATIVE 08/13/2023 1024   KETONESUR NEGATIVE 08/13/2023 1024   PROTEINUR 100 (A) 08/13/2023 1024   UROBILINOGEN 0.2 07/04/2010 0532   NITRITE NEGATIVE 08/13/2023 1024   LEUKOCYTESUR NEGATIVE 08/13/2023 1024   Radiological Exams on Admission: MR BRAIN WO CONTRAST Result Date: 08/19/2024 EXAM: MRI BRAIN WITHOUT CONTRAST 08/19/2024 01:49:40 PM TECHNIQUE: Multiplanar multisequence MRI of the head/brain was performed without the administration of intravenous contrast. COMPARISON: CT of the head and CT angiogram of the head and neck dated 08/19/2024. CLINICAL HISTORY: Neuro deficit, acute, stroke suspected. FINDINGS: BRAIN AND VENTRICLES: Subtle restricted diffusion present anteriorly and superiorly within the right cerebellar hemisphere, which is demonstrated on images 17 through 20 of series 2. No intracranial hemorrhage. No mass. No midline shift. No hydrocephalus. The sella is unremarkable. Normal flow voids. Age-related  atrophy. Mild-to-moderate cerebral white matter disease. The study is degraded by patient motion. ORBITS: No significant abnormality. SINUSES AND MASTOIDS: Moderate circumferential mucosal disease within the left maxillary sinus. BONES AND SOFT TISSUES: Normal marrow signal. No soft tissue abnormality. IMPRESSION: 1. Subtle restricted diffusion in the right cerebellar hemisphere, suspicious for acute infarct. 2. Age-related atrophy and mild-to-moderate cerebral white matter disease; study degraded by patient motion. 3. Moderate circumferential mucosal disease within the left maxillary sinus. Electronically signed by: Evalene Coho MD 08/19/2024 02:42 PM EST RP Workstation: [id number]   CT ANGIO HEAD NECK W WO CM (CODE STROKE) Result Date: 08/19/2024 EXAM: CTA HEAD AND NECK WITHOUT AND WITH 08/19/2024 01:33:30 PM TECHNIQUE: CTA of the head and neck was performed without and with the administration of 75 mL of iohexol  (OMNIPAQUE ) 350 MG/ML injection. Multiplanar 2D and/or 3D reformatted images are provided for review. Automated exposure control, iterative reconstruction, and/or weight based adjustment of the mA/kV was utilized to reduce the radiation dose to as low as reasonably achievable. Stenosis of the internal carotid arteries measured using NASCET criteria. COMPARISON: Same day CT head. CLINICAL HISTORY: Neuro deficit, acute, stroke suspected. FINDINGS: CTA NECK: AORTIC ARCH AND ARCH VESSELS: Mild atherosclerosis of the aortic arch. Atherosclerosis at the left subclavian artery origin without stenosis. Atherosclerosis along the proximal right subclavian resulting in mild stenosis. No dissection or arterial injury. CERVICAL CAROTID ARTERIES: Mild atherosclerosis at the right carotid bifurcation without hemodynamically significant stenosis by NASCET criteria. No dissection or arterial injury. CERVICAL VERTEBRAL ARTERIES: Small caliber of the bilateral vertebral arteries. There is somewhat limited evaluation  of the V1 and proximal V2 segments due to streak artifact and small vessel caliber. Visualized portions of the vertebral arteries are patent to the vertebrobasilar confluence. No dissection or arterial injury. LUNGS AND MEDIASTINUM: Patulous appearance of the upper thoracic esophagus. Lungs are unremarkable. SOFT TISSUES: Subcentimeter nodules in the thyroid. Mucosal thickening in the left maxillary sinus with findings suggestive of chronic sinusitis. BONES: Degenerative changes in the visualized cervical spine. CTA HEAD: ANTERIOR CIRCULATION: Atherosclerosis of the carotid siphons without significant stenosis. No significant stenosis of the anterior cerebral arteries. No significant stenosis of the middle cerebral arteries. No aneurysm. POSTERIOR CIRCULATION: The basilar artery is patent but demonstrates significant distal tapering and essentially terminates at the origins of the superior cerebellar arteries. The posterior cerebral arteries are mainly supplied via the posterior communicating arteries. No significant stenosis of the vertebral arteries. No aneurysm. OTHER: No dural venous sinus thrombosis on this non-dedicated study. IMPRESSION: 1. No large vessel occlusion, hemodynamically significant  stenosis, or aneurysm in the head or neck. 2. Significant distal tapering of the basilar artery, essentially terminating at the origins of the superior cerebellar arteries, with the posterior cerebral arteries mainly supplied via the posterior communicating arteries. 3. Small caliber bilateral vertebral arteries with limited evaluation of the V1 and proximal V2 segments due to streak artifact. 4. Mild atherosclerosis as above. Electronically signed by: Donnice Mania MD 08/19/2024 02:08 PM EST RP Workstation: [id number]   CT HEAD CODE STROKE WO CONTRAST (LKW 0-4.5h, LVO 0-24h) Result Date: 08/19/2024 EXAM: CT HEAD WITHOUT CONTRAST 08/19/2024 01:25:23 PM TECHNIQUE: CT of the head was performed without the  administration of intravenous contrast. Automated exposure control, iterative reconstruction, and/or weight based adjustment of the mA/kV was utilized to reduce the radiation dose to as low as reasonably achievable. COMPARISON: CT head 07/21/2023. CLINICAL HISTORY: Neuro deficit, acute, stroke suspected. FINDINGS: BRAIN AND VENTRICLES: No acute hemorrhage. No evidence of acute infarct. Remote lacunar infarct in the left centrum semiovale. Mild periventricular white matter changes. No hydrocephalus. No extra-axial collection. No mass effect or midline shift. Atherosclerosis of the carotid siphons. ORBITS: No acute abnormality. SINUSES: Left maxillary sinus mucosal thickening. SOFT TISSUES AND SKULL: No acute soft tissue abnormality. No skull fracture. [place] Stroke Program Early CT (ASPECT) Score: Ganglionic (caudate, IC, lentiform nucleus, insula, M1-M3): 7 Supraganglionic (M4-M6): 3 Total: 10 IMPRESSION: 1. No acute intracranial abnormality. 2. ASPECTS 10. 3. Remote lacunar infarct in the left centrum semiovale. 4. Findings messaged to Dr. Arora via the [facility] messaging system at 1:45 pm on 08/19/24. Electronically signed by: Donnice Mania MD 08/19/2024 01:50 PM EST RP Workstation: [id number]   Data Reviewed: Relevant notes from primary care and specialist visits, past discharge summaries as available in EHR, including Care Everywhere . Prior diagnostic testing as pertinent to current admission diagnoses, Updated medications and problem lists for reconciliation .ED course, including vitals, labs, imaging, treatment and response to treatment,Triage notes, nursing and pharmacy notes and ED provider's notes.Notable results as noted in HPI.Discussed case with EDMD/ ED APP/ or Specialty MD on call and as needed.  Assessment & Plan  >>Acute CVA/ Right cerebellar cva: We will defer [facility] therapy to Neurology.  Cont pt on statin.Hold metoprolol .  PT/OT/ST bedside swallow.  Aspiration / Fall precaution. Ambulation  with assistance.  Permissive Htn unless specified per stroke team.     >>Recent UGIB: Pt admitted 08/07/24- 08/12/24 for GIB from mallory weiss tear that was clipped.  EGD shows: A Mallory- Weiss tear was found was found at the gastroesophageal junction. No active bleeding but obvious stigmata of pigmented protuberance. The lesion was successfully injected with 2 mL of a 1- 10, 000 solution of epinephrine  for hemostasis. Thereafter, 3 hemostatic clips were successfully placed across the defect. An additional hemostatic clip did not adhere. There was no bleeding at the end of the procedure. The esophagus was otherwise normal. The stomach was normal except for hiatal hernia. The examined duodenum revealed pseudomelanosis but was otherwise normal.  >>Anemia: Pt has anemia but hb is stable.    Latest Ref Rng & Units 08/19/2024    1:25 PM 08/19/2024    1:23 PM 08/12/2024    6:19 AM  CBC  WBC 4.0 - 10.5 K/uL 6.2   5.6   Hemoglobin 13.0 - 17.0 g/dL 7.5  7.8  7.7   Hematocrit 39.0 - 52.0 % 24.6  23.0  24.9   Platelets 150 - 400 K/uL 164   106   We will occult stool and if  positive will get GI involved and consider transfusion of 1 unit PRBC.   >>PAF: Off anticoagulation 2/2 to GIB.  PTA meds amiodarone  and metoprolol  will continue amiodarone .  Most recent TEE in September 2025 showed dilated left atrium severe LVH EF of 50 to 55%.  >>CKD3a / Cadaveric renal transplant in 2018: Currently on prograf  and Cellcept  .   >>H/O CAD: Continue statin.  Antiplatelet held ?  DVT prophylaxis:  Heparin .   Consults:  Neurology.   Advance Care Planning:    Code Status: Full Code   Family Communication:  None.  Disposition Plan:  Home.  Severity of Illness: The appropriate patient status for this patient is INPATIENT. Inpatient status is judged to be reasonable and necessary in order to provide the required intensity of service to ensure the patient's safety. The patient's presenting symptoms,  physical exam findings, and initial radiographic and laboratory data in the context of their chronic comorbidities is felt to place them at high risk for further clinical deterioration. Furthermore, it is not anticipated that the patient will be medically stable for discharge from the hospital within 2 midnights of admission.   * I certify that at the point of admission it is my clinical judgment that the patient will require inpatient hospital care spanning beyond 2 midnights from the point of admission due to high intensity of service, high risk for further deterioration and high frequency of surveillance required.*  Unresulted Labs (From admission, onward)     Start     Ordered   08/20/24 0500  Occult blood card to lab, stool  Daily,   R      08/19/24 1553   08/19/24 1553  TSH  Add-on,   AD        08/19/24 1553   08/19/24 1553  T4, free  Add-on,   AD        08/19/24 1553   08/19/24 1541  Magnesium   Add-on,   AD        08/19/24 1540   08/19/24 1430  Lipid panel  (Labs)  Add-on,   AD       Comments: Fasting    08/19/24 1430   08/19/24 1429  Hemoglobin A1c  (Labs)  Once,   R       Comments: To assess prior glycemic control    08/19/24 1430   08/19/24 1318  Urine rapid drug screen (hosp performed)  (Code Stroke (Thrombolytic / IR candidate emergent stroke workup.))  Once,   URGENT       Question:  Indication  Answer:  Altered mental status, unspecified R41.82   08/19/24 1317            Meds ordered this encounter  Medications   iohexol  (OMNIPAQUE ) 350 MG/ML injection 75 mL    stroke: early stages of recovery book   0.9 %  sodium chloride  infusion   OR Linked Order Group    acetaminophen  (TYLENOL ) tablet 650 mg    acetaminophen  (TYLENOL ) 160 MG/5ML solution 650 mg    acetaminophen  (TYLENOL ) suppository 650 mg   albuterol  (VENTOLIN  HFA) 108 (90 Base) MCG/ACT inhaler 2 puff   amiodarone  (PACERONE ) tablet 200 mg   atorvastatin  (LIPITOR) tablet 20 mg   brimonidine -timolol   (COMBIGAN ) 0.2-0.5 % ophthalmic solution 1 drop   mycophenolate  (CELLCEPT ) 200 MG/ML suspension 1,000 mg   predniSONE  5 MG/5ML solution 5 mg   tamsulosin  (FLOMAX ) capsule 0.4 mg   tacrolimus  (PROGRAF ) capsule 2-3 mg   Latanoprostene Bunod  0.024 % SOLN  1 drop   sucralfate  (CARAFATE ) 1 GM/10ML suspension 1 g   pantoprazole  (PROTONIX ) injection 40 mg   hydrALAZINE  (APRESOLINE ) injection 10 mg     Orders Placed This Encounter  Procedures   Critical Care   CT HEAD CODE STROKE WO CONTRAST (LKW 0-4.5h, LVO 0-24h)   CT ANGIO HEAD NECK W WO CM (CODE STROKE)   MR BRAIN WO CONTRAST   Protime-INR   APTT   CBC   Differential   Comprehensive metabolic panel   Ethanol   Urine rapid drug screen (hosp performed)   Lipid panel   Hemoglobin A1c   Magnesium    TSH   T4, free   Occult blood card to lab, stool   Diet NPO time specified   Vital signs q 2 hours x 12 hours, then q 4 hours   ED Cardiac monitoring   NIH Stroke Scale   Swallow screen   Place X2 Large Bore IV's   Nurse notify provider if SBP > 220/120   If O2 sat <94% Administer O2 @ 2 Liters/Minute   Initiate Carrier Fluid Protocol   NIHSS score documentation NIHSS score range: 0-42   Vital signs   Notify physician (specify)   OOB with assistance   Swallow screen - If patient does NOT pass this screen, place order for SLP eval and treat (SLP2) - swallowing evaluation (BSE, MBS and/or diet order as indicated)   NIH Stroke Scale   Intake and output   Cardiac Monitoring Continuous x 24 hours Indications for use: Acute neurological event   Apply Stroke Care Plan: Ischemic Stroke, TIA   Discuss with patient and document patient's goals for stroke risk factor reduction   Initiate Oral Care Protocol   Initiate Carrier Fluid Protocol   Provide stroke education material to patient and family.   Nurse to provide smoking / tobacco cessation education   If the patient has passed the Stroke Swallow Screen or has a feeding tube, then RN  may order General Admission PRN Orders (through manage orders) for the following patient needs: allergy symptoms (Claritin ), cold sores (Carmex), cough (Robitussin DM), eye irritation (Liquifilm Tears), hemorrhoids (Tucks), indigestion (Maalox), minor skin irritation (hydrocortisone cream), muscle pain Lucienne Gay), nose irritation (saline nasal spray) and sore throat (Chloraseptic spray).   SCD's   Neuro checks   Full code   [place] to Activate Code Stroke   Consult to hospitalist   Consult to Registered Dietitian   Consult to Transition of Care Team   OT eval and treat   PT eval and treat   ED Pulse oximetry, continuous   Oxygen therapy Mode or (Route): Nasal cannula; Liters Per Minute: 2; Keep O2 saturation between: greater than 94 %   SLP eval and treat Reason for evaluation: Cognitive/Language evaluation   I-stat chem 8, ED   CBG monitoring, ED   I-Stat CG4 Lactic Acid   ED EKG   EKG 12-Lead   ECHOCARDIOGRAM COMPLETE BUBBLE STUDY   Saline lock IV   Admit to Inpatient (patient's expected length of stay will be greater than 2 midnights or inpatient only procedure)   Fall precautions   Aspiration precautions    Author: Mario LULLA Blanch, MD 12 pm- 8 pm. [facility]. 08/19/2024 3:55 PM Please note for any communication after hours contact [facility] Assigned provider on call on Amion.       [1] No Known Allergies  "

## 2024-08-19 NOTE — Progress Notes (Signed)
 Pharmacy still hasn't send up medications due on this shift- med requests have been in since 1800- will notify next shift

## 2024-08-19 NOTE — Code Documentation (Signed)
 Stroke Response Nurse Documentation Code Documentation  [name] is a 69 y.o. male arriving to [name]  via [facility] on 08/19/2024 with past medical hx of CKD with renal transplant, hypertension, CAD, a-fib, and a recent GI bleed. On Eliquis  (apixaban ) daily, but recently stopped due to GI bleeding and melanous stool. Code stroke was activated by EMS.   Patient from a doctors office, where he was being seen for a follow up visit, where he was LKW at 1208 by the physicians at the doctors office. He now states he is having a sudden onset of right eye blurriness and bilateral leg heaviness.  Stroke team at the bedside on patient arrival. Labs drawn and patient cleared for CT by Dr. Ruthe. Patient to CT with team. NIHSS 3, see documentation for details and code stroke times. Patient with right leg weakness, right decreased sensation, and dysarthria  on exam. The following imaging was completed:  CT Head, CTA, and MRI. Patient is not a candidate for IV Thrombolytic due to recent anticoagulation, and recent GI Bleed. Patient is not a candidate for IR due to LVO not suspected.   Care Plan: Q2h NIHSS/VS. [name] when appropriate.  Process Delays Noted: MRI before treatment decision  Bedside handoff with ED RN [name].    [name] M [name]  Stroke Response RN

## 2024-08-19 NOTE — Hospital Course (Addendum)
 SABRA

## 2024-08-19 NOTE — ED Triage Notes (Signed)
 BIB [facility] from doctors appointment for sudden onset of dizziness, slurred speech and R arm & R leg numbness and tingling beginning at 12 pm today. A&O x4 at baseline. Afib HR 60s, 140s systolic, 120 CBG 20G RAC

## 2024-08-19 NOTE — ED Provider Notes (Signed)
 " [place] EMERGENCY DEPARTMENT AT [facility] Provider Note   CSN: [id number] Arrival date & time: 08/19/24  1317  An emergency department physician performed an initial assessment on this suspected stroke patient at 1315.  Patient presents with: No chief complaint on file.   [name] is a 69 y.o. male.   Patient here with strokelike symptoms.  Code stroke activated in the field with EMS.  Right-sided facial droop, slurred speech right arm and leg weakness started about an hour and a half ago.  He has had Eliquis  on hold for the last 2 weeks due to recent GI bleed.  He is on Eliquis  for A-fib.  He is in rate controlled A-fib for EMS.  He has history of renal transplant.  He had multiple blood transfusions for GI bleed here recently.  Denies any weakness.  Is having maybe some numbness on the right side.  He has noticed some blurred vision in the right eye as well.  The history is provided by the patient.       Prior to Admission medications  Medication Sig Start Date End Date Taking? Authorizing Provider  albuterol  (PROVENTIL  HFA;VENTOLIN  HFA) 108 (90 Base) MCG/ACT inhaler Inhale 2 puffs into the lungs every 6 (six) hours as needed for wheezing or shortness of breath. 01/24/17   Alfornia Madison, MD  amiodarone  (PACERONE ) 200 MG tablet Take 1 tablet (200 mg total) by mouth daily. 05/18/24   Meng, Hao, PA  apixaban  (ELIQUIS ) 5 MG TABS tablet Take 1 tablet (5 mg total) by mouth 2 (two) times daily. 04/18/24   Lelon Hamilton T, PA-C  atorvastatin  (LIPITOR) 20 MG tablet Take 1 tablet (20 mg total) by mouth at bedtime. 05/14/24   Meng, Hao, PA  brimonidine -timolol  (COMBIGAN ) 0.2-0.5 % ophthalmic solution Place 1 drop into both eyes every 12 (twelve) hours.    [provider]  calcium  carbonate (TUMS - DOSED IN MG ELEMENTAL CALCIUM ) 500 MG chewable tablet Chew 1 tablet (200 mg of elemental calcium  total) by mouth 3 (three) times daily as needed for indigestion or  heartburn. 08/12/24 09/11/24  Arlice Reichert, MD  empagliflozin  (JARDIANCE ) 10 MG TABS tablet Take 1 tablet (10 mg total) by mouth daily before breakfast. 05/14/24   Meng, Hao, PA  GEMTESA 75 MG TABS Take 1 tablet by mouth daily. 05/27/24   [provider]  hydrALAZINE  (APRESOLINE ) 100 MG tablet Take 1 tablet (100 mg total) by mouth every 8 (eight) hours. Patient taking differently: Take 100 mg by mouth 3 (three) times daily. 01/06/23   Patsy Lenis, MD  magnesium  oxide (MAG-OX) 400 MG tablet Take 800 mg by mouth 2 (two) times daily. 01/28/24   [provider]  metoprolol  tartrate (LOPRESSOR ) 100 MG tablet Take 1 tablet (100 mg total) by mouth 2 (two) times daily. 04/18/24   Lelon Hamilton T, PA-C  mycophenolate  (CELLCEPT ) 200 MG/ML suspension Take 1,000 mg by mouth 2 (two) times a day. 03/20/18   [provider]  pantoprazole  (PROTONIX ) 40 MG tablet Take 1 tablet (40 mg total) by mouth daily. 08/12/24   Arlice Reichert, MD  predniSONE  5 MG/5ML solution Take 5 mg by mouth daily. 11/18/18   [provider]  spironolactone  (ALDACTONE ) 25 MG tablet Take 0.5 tablet (12.5 mg total) by mouth daily. 04/20/24   Lelon Hamilton T, PA-C  sucralfate  (CARAFATE ) 1 GM/10ML suspension Take 10 mLs (1 g total) by mouth 3 (three) times daily before meals. 08/12/24 08/26/24  Arlice Reichert, MD  sulfamethoxazole -trimethoprim  (BACTRIM ) 200-40 MG/5ML suspension Take 10 mLs by mouth 3 (three) times a week. Monday, Wednesday, Friday 07/20/24   [provider]  tacrolimus  (PROGRAF ) 1 MG capsule Take 2-3 mg by mouth See admin instructions. Take 3 mg by mouth in the morning and 2 mg at bedtime    [provider]  tamsulosin  (FLOMAX ) 0.4 MG CAPS capsule Take 0.4 mg by mouth at bedtime. 07/13/24   [provider]  VYZULTA  0.024 % SOLN Place 1 drop into both eyes at bedtime.    [provider]  fluticasone  (FLOVENT  HFA) 44 MCG/ACT inhaler Inhale 1 puff into the lungs 2 (two)  times daily. Patient not taking: No sig reported 03/08/17 03/04/21  Sood, Vineet, MD    Allergies: Patient has no known allergies.    Review of Systems  Updated Vital Signs Wt 112.4 kg   BMI 33.61 kg/m   Physical Exam Vitals and nursing note reviewed.  Constitutional:      General: He is not in acute distress.    Appearance: He is well-developed. He is not ill-appearing.  HENT:     Head: Normocephalic and atraumatic.     Nose: Nose normal.     Mouth/Throat:     Mouth: Mucous membranes are moist.  Eyes:     Extraocular Movements: Extraocular movements intact.     Conjunctiva/sclera: Conjunctivae normal.     Pupils: Pupils are equal, round, and reactive to light.  Cardiovascular:     Rate and Rhythm: Normal rate and regular rhythm.     Pulses: Normal pulses.     Heart sounds: Normal heart sounds. No murmur heard. Pulmonary:     Effort: Pulmonary effort is normal. No respiratory distress.     Breath sounds: Normal breath sounds.  Abdominal:     General: Abdomen is flat.     Palpations: Abdomen is soft.     Tenderness: There is no abdominal tenderness.  Musculoskeletal:        General: No swelling.     Cervical back: Normal range of motion and neck supple.  Skin:    General: Skin is warm and dry.     Capillary Refill: Capillary refill takes less than 2 seconds.  Neurological:     Mental Status: He is alert.     Comments: 4+ out of 5 weakness in the right upper arm and right lower extremity, mild right-sided facial droop visual fields appear to be grossly intact he has some decrease sensation over the right side, mildly slurred speech but otherwise normal strength and sensation throughout  Psychiatric:        Mood and Affect: Mood normal.     (all labs ordered are listed, but only abnormal results are displayed) Labs Reviewed  PROTIME-INR - Abnormal; Notable for the following components:      Result Value   Prothrombin  Time 15.5 (*)    All other components within  normal limits  APTT - Abnormal; Notable for the following components:   aPTT 38 (*)    All other components within normal limits  CBC - Abnormal; Notable for the following components:   RBC 2.55 (*)    Hemoglobin 7.5 (*)    HCT 24.6 (*)    RDW 16.3 (*)    All other components within normal limits  COMPREHENSIVE METABOLIC PANEL WITH GFR - Abnormal; Notable for the following components:   Chloride 114 (*)    CO2 17 (*)    Calcium  7.1 (*)  Total Protein 4.9 (*)    Albumin  3.3 (*)    All other components within normal limits  I-STAT CHEM 8, ED - Abnormal; Notable for the following components:   Chloride 113 (*)    Creatinine, Ser 1.30 (*)    Calcium , Ion 0.93 (*)    TCO2 18 (*)    Hemoglobin 7.8 (*)    HCT 23.0 (*)    All other components within normal limits  DIFFERENTIAL  ETHANOL  URINE DRUG SCREEN    EKG: None  Radiology: CT HEAD CODE STROKE WO CONTRAST (LKW 0-4.5h, LVO 0-24h) Result Date: 08/19/2024 EXAM: CT HEAD WITHOUT CONTRAST 08/19/2024 01:25:23 PM TECHNIQUE: CT of the head was performed without the administration of intravenous contrast. Automated exposure control, iterative reconstruction, and/or weight based adjustment of the mA/kV was utilized to reduce the radiation dose to as low as reasonably achievable. COMPARISON: CT head 07/21/2023. CLINICAL HISTORY: Neuro deficit, acute, stroke suspected. FINDINGS: BRAIN AND VENTRICLES: No acute hemorrhage. No evidence of acute infarct. Remote lacunar infarct in the left centrum semiovale. Mild periventricular white matter changes. No hydrocephalus. No extra-axial collection. No mass effect or midline shift. Atherosclerosis of the carotid siphons. ORBITS: No acute abnormality. SINUSES: Left maxillary sinus mucosal thickening. SOFT TISSUES AND SKULL: No acute soft tissue abnormality. No skull fracture. [place] Stroke Program Early CT (ASPECT) Score: Ganglionic (caudate, IC, lentiform nucleus, insula, M1-M3): 7 Supraganglionic  (M4-M6): 3 Total: 10 IMPRESSION: 1. No acute intracranial abnormality. 2. ASPECTS 10. 3. Remote lacunar infarct in the left centrum semiovale. 4. Findings messaged to Dr. Arora via the [facility] messaging system at 1:45 pm on 08/19/24. Electronically signed by: Donnice Mania MD 08/19/2024 01:50 PM EST RP Workstation: [id number]     .Critical Care  Performed by: Ruthe Cornet, DO Authorized by: Ruthe Cornet, DO   Critical care provider statement:    Critical care time (minutes):  35   Critical care was necessary to treat or prevent imminent or life-threatening deterioration of the following conditions:  CNS failure or compromise   Critical care was time spent personally by me on the following activities:  Development of treatment plan with patient or surrogate, discussions with consultants, evaluation of patient's response to treatment, examination of patient, ordering and review of laboratory studies, ordering and review of radiographic studies, ordering and performing treatments and interventions, pulse oximetry, re-evaluation of patient's condition and review of old charts   Care discussed with: admitting provider      Medications Ordered in the ED  iohexol  (OMNIPAQUE ) 350 MG/ML injection 75 mL (75 mLs Intravenous Contrast Given 08/19/24 1334)                                    Medical Decision Making Amount and/or Complexity of Data Reviewed Labs: ordered. Radiology: ordered.  Risk Decision regarding hospitalization.   [name] arrives as a code stroke.  Unremarkable vitals.  No fever.  Symptoms about an hour and a half ago.  Right sided blurry vision right arm and leg weakness slurred speech.  He has a history of A-fib on Eliquis  but he has not been on Eliquis  for the last 2 weeks due to recent GI bleed.  CT of the head shows no acute head bleed.  Neurology Dr. Deedra at the bedside.  Will not be a candidate for TNK given recent GI bleed.  He was taken directly for MRI which did  confirm  a right cerebellar stroke consistent with his symptoms.  There is no LVO.  Will not get TNK given recent GI bleed.  Basic labs are unremarkable.  Hemoglobin 7.5.  Renal transplant history.  Overall we will admit to the hospitalist for further stroke management.  Hemodynamically stable throughout my care.  This chart was dictated using voice recognition software.  Despite best efforts to proofread,  errors can occur which can change the documentation meaning.      Final diagnoses:  Cerebrovascular accident (CVA), unspecified mechanism [facility])    ED Discharge Orders     None          Ruthe Cornet, DO 08/19/24 1403  "

## 2024-08-20 ENCOUNTER — Inpatient Hospital Stay (HOSPITAL_COMMUNITY)

## 2024-08-20 ENCOUNTER — Encounter (HOSPITAL_COMMUNITY): Payer: Self-pay | Admitting: Internal Medicine

## 2024-08-20 DIAGNOSIS — E785 Hyperlipidemia, unspecified: Secondary | ICD-10-CM | POA: Diagnosis not present

## 2024-08-20 DIAGNOSIS — I6389 Other cerebral infarction: Secondary | ICD-10-CM

## 2024-08-20 DIAGNOSIS — Z940 Kidney transplant status: Secondary | ICD-10-CM | POA: Diagnosis not present

## 2024-08-20 DIAGNOSIS — Z6833 Body mass index (BMI) 33.0-33.9, adult: Secondary | ICD-10-CM | POA: Diagnosis not present

## 2024-08-20 DIAGNOSIS — I480 Paroxysmal atrial fibrillation: Secondary | ICD-10-CM | POA: Diagnosis not present

## 2024-08-20 DIAGNOSIS — D649 Anemia, unspecified: Secondary | ICD-10-CM | POA: Diagnosis not present

## 2024-08-20 DIAGNOSIS — E669 Obesity, unspecified: Secondary | ICD-10-CM | POA: Diagnosis not present

## 2024-08-20 DIAGNOSIS — I120 Hypertensive chronic kidney disease with stage 5 chronic kidney disease or end stage renal disease: Secondary | ICD-10-CM

## 2024-08-20 DIAGNOSIS — I639 Cerebral infarction, unspecified: Secondary | ICD-10-CM | POA: Diagnosis not present

## 2024-08-20 DIAGNOSIS — Q283 Other malformations of cerebral vessels: Secondary | ICD-10-CM | POA: Diagnosis not present

## 2024-08-20 DIAGNOSIS — I779 Disorder of arteries and arterioles, unspecified: Secondary | ICD-10-CM | POA: Diagnosis not present

## 2024-08-20 DIAGNOSIS — N186 End stage renal disease: Secondary | ICD-10-CM

## 2024-08-20 LAB — CBC
HCT: 28.6 % — ABNORMAL LOW (ref 39.0–52.0)
Hemoglobin: 8.7 g/dL — ABNORMAL LOW (ref 13.0–17.0)
MCH: 28.5 pg (ref 26.0–34.0)
MCHC: 30.4 g/dL (ref 30.0–36.0)
MCV: 93.8 fL (ref 80.0–100.0)
Platelets: 175 K/uL (ref 150–400)
RBC: 3.05 MIL/uL — ABNORMAL LOW (ref 4.22–5.81)
RDW: 16 % — ABNORMAL HIGH (ref 11.5–15.5)
WBC: 6.5 K/uL (ref 4.0–10.5)
nRBC: 0 % (ref 0.0–0.2)

## 2024-08-20 LAB — ECHOCARDIOGRAM COMPLETE BUBBLE STUDY
AR max vel: 2.35 cm2
AV Area VTI: 2.16 cm2
AV Area mean vel: 2.36 cm2
AV Mean grad: 7 mmHg
AV Peak grad: 13.5 mmHg
Ao pk vel: 1.84 m/s
Area-P 1/2: 3.85 cm2
Calc EF: 60.4 %
P 1/2 time: 187 ms
S' Lateral: 3.1 cm
Single Plane A2C EF: 53.6 %
Single Plane A4C EF: 62.8 %

## 2024-08-20 LAB — BASIC METABOLIC PANEL WITH GFR
Anion gap: 8 (ref 5–15)
BUN: 10 mg/dL (ref 8–23)
CO2: 20 mmol/L — ABNORMAL LOW (ref 22–32)
Calcium: 8.5 mg/dL — ABNORMAL LOW (ref 8.9–10.3)
Chloride: 108 mmol/L (ref 98–111)
Creatinine, Ser: 1.43 mg/dL — ABNORMAL HIGH (ref 0.61–1.24)
GFR, Estimated: 54 mL/min — ABNORMAL LOW
Glucose, Bld: 110 mg/dL — ABNORMAL HIGH (ref 70–99)
Potassium: 4.7 mmol/L (ref 3.5–5.1)
Sodium: 136 mmol/L (ref 135–145)

## 2024-08-20 MED ORDER — APIXABAN 5 MG PO TABS
5.0000 mg | ORAL_TABLET | Freq: Two times a day (BID) | ORAL | Status: DC
  Administered 2024-08-20 – 2024-08-21 (×2): 5 mg via ORAL
  Filled 2024-08-20 (×2): qty 1

## 2024-08-20 MED ORDER — HEPARIN SODIUM (PORCINE) 5000 UNIT/ML IJ SOLN: 5000.0000 [IU] | Freq: Three times a day (TID) | INTRAMUSCULAR | Status: DC

## 2024-08-20 MED ORDER — PREDNISONE 5 MG PO TABS
5.0000 mg | ORAL_TABLET | Freq: Every day | ORAL | Status: DC
  Administered 2024-08-20 – 2024-08-21 (×2): 5 mg via ORAL
  Filled 2024-08-20 (×2): qty 1

## 2024-08-20 MED ORDER — ONDANSETRON HCL 4 MG/2ML IJ SOLN: 4.0000 mg | Freq: Four times a day (QID) | INTRAMUSCULAR | Status: DC | PRN

## 2024-08-20 MED ORDER — LEVOTHYROXINE SODIUM 50 MCG PO TABS
50.0000 ug | ORAL_TABLET | Freq: Every day | ORAL | Status: DC
  Administered 2024-08-21: 50 ug via ORAL
  Filled 2024-08-20: qty 1

## 2024-08-20 NOTE — Plan of Care (Signed)
 Patient is progressing towards plan of care.    Problem: Education: Goal: Knowledge of disease or condition will improve Outcome: Progressing Goal: Knowledge of secondary prevention will improve (MUST DOCUMENT ALL) Outcome: Progressing Goal: Knowledge of patient specific risk factors will improve (DELETE if not current risk factor) Outcome: Progressing   Problem: Ischemic Stroke/TIA Tissue Perfusion: Goal: Complications of ischemic stroke/TIA will be minimized Outcome: Progressing   Problem: Coping: Goal: Will verbalize positive feelings about self Outcome: Progressing Goal: Will identify appropriate support needs Outcome: Progressing   Problem: Health Behavior/Discharge Planning: Goal: Ability to manage health-related needs will improve Outcome: Progressing Goal: Goals will be collaboratively established with patient/family Outcome: Progressing   Problem: Self-Care: Goal: Ability to participate in self-care as condition permits will improve Outcome: Progressing Goal: Verbalization of feelings and concerns over difficulty with self-care will improve Outcome: Progressing Goal: Ability to communicate needs accurately will improve Outcome: Progressing   Problem: Nutrition: Goal: Risk of aspiration will decrease Outcome: Progressing Goal: Dietary intake will improve Outcome: Progressing   Problem: Education: Goal: Knowledge of General Education information will improve Description: Including pain rating scale, medication(s)/side effects and non-pharmacologic comfort measures Outcome: Progressing   Problem: Health Behavior/Discharge Planning: Goal: Ability to manage health-related needs will improve Outcome: Progressing   Problem: Clinical Measurements: Goal: Ability to maintain clinical measurements within normal limits will improve Outcome: Progressing Goal: Will remain free from infection Outcome: Progressing Goal: Diagnostic test results will improve Outcome:  Progressing Goal: Respiratory complications will improve Outcome: Progressing Goal: Cardiovascular complication will be avoided Outcome: Progressing   Problem: Activity: Goal: Risk for activity intolerance will decrease Outcome: Progressing   Problem: Nutrition: Goal: Adequate nutrition will be maintained Outcome: Progressing   Problem: Coping: Goal: Level of anxiety will decrease Outcome: Progressing   Problem: Elimination: Goal: Will not experience complications related to bowel motility Outcome: Progressing Goal: Will not experience complications related to urinary retention Outcome: Progressing   Problem: Pain Managment: Goal: General experience of comfort will improve and/or be controlled Outcome: Progressing   Problem: Safety: Goal: Ability to remain free from injury will improve Outcome: Progressing   Problem: Skin Integrity: Goal: Risk for impaired skin integrity will decrease Outcome: Progressing

## 2024-08-20 NOTE — Evaluation (Signed)
 Physical Therapy Evaluation Patient Details Name: Jon [name] MRN: [id number] DOB: Sep 14, 1955 Today's Date: 08/20/2024  History of Present Illness  69 y.o. male with past medical history  of essential hypertension, nonischemic cardiomyopathy with severe LVH, history of CAD, history of acute blood loss anemia and GI bleed, CKD status post cadaveric transplant,coming from pcp office for right eye blurred vision and right leg weakness.  MRI of the brain noncontrast shows right cerebellar hemisphere acute infarct.   Clinical Impression  Pt is currently mobilizing at his baseline with no significant strength deficits noted. Pt is independent with transfers and ambulation around the unit. Pt demonstrates good safety awareness throughout. No concerns noted during static standing while toileting during session. Pt reports feeling as though he is mobilizing at his baseline and reports no concerns at this time. PT to sign off due to pt being independent with mobility. Re-consult as needed if mobility needs change.         If plan is discharge home, recommend the following: A little help with walking and/or transfers;Help with stairs or ramp for entrance;Assist for transportation   Can travel by private vehicle        Equipment Recommendations None recommended by PT  Recommendations for Other Services       Functional Status Assessment Patient has not had a recent decline in their functional status     Precautions / Restrictions Precautions Precautions: Fall Recall of Precautions/Restrictions: Intact Restrictions Weight Bearing Restrictions Per Provider Order: No      Mobility  Bed Mobility Overal bed mobility: Modified Independent             General bed mobility comments: Pt seated in recliner upon arrival. No assist needed to return to bed at end of session.    Transfers Overall transfer level: Independent Equipment used: None               General transfer comment:  Steady upon standing without [place]. Good eccentric control upon return to sit.    Ambulation/Gait Ambulation/Gait assistance: Supervision Gait Distance (Feet): 120 Feet Assistive device: None Gait Pattern/deviations: WFL(Within Functional Limits)   Gait velocity interpretation: 1.31 - 2.62 ft/sec, indicative of limited community ambulator   General Gait Details: Pt demonstrates no gait concerns while ambulating around the unit, tolerates well and demontrates good safety awareness. Some assist needed to locate room.  Stairs            Wheelchair Mobility     Tilt Bed    Modified Rankin (Stroke Patients Only)       Balance Overall balance assessment: No apparent balance deficits (not formally assessed) (Pt demonstrates good static standing balance in bathroom while toileting.)                                           Pertinent Vitals/Pain Pain Assessment Pain Assessment: No/denies pain    Home Living Family/patient expects to be discharged to:: Private residence Living Arrangements: Alone Available Help at Discharge: Family;Available PRN/intermittently Type of Home: House Home Access: Level entry       Home Layout: Two level;Able to live on main level with bedroom/bathroom Home Equipment: Rolling Skellenger (2 wheels)      Prior Function Prior Level of Function : Independent/Modified Independent             Mobility Comments: Denies using DME at baseline. ADLs  Comments: Independent with ADLs.     Extremity/Trunk Assessment   Upper Extremity Assessment Upper Extremity Assessment: Defer to OT evaluation    Lower Extremity Assessment Lower Extremity Assessment: Overall WFL for tasks assessed    Cervical / Trunk Assessment Cervical / Trunk Assessment: Normal  Communication   Communication Communication: No apparent difficulties    Cognition Arousal: Alert Behavior During Therapy: Flat affect   PT - Cognitive impairments: No  apparent impairments                         Following commands: Intact       Cueing Cueing Techniques: Verbal cues, Tactile cues, Visual cues     General Comments General comments (skin integrity, edema, etc.): VSS throughout. No significant skin abnormalities noted.    Exercises     Assessment/Plan    PT Assessment Patient does not need any further PT services  PT Problem List         PT Treatment Interventions      PT Goals (Current goals can be found in the Care Plan section)  Acute Rehab PT Goals Patient Stated Goal: None stated    Frequency       Co-evaluation               AM-PAC PT 6 Clicks Mobility  Outcome Measure Help needed turning from your back to your side while in a flat bed without using bedrails?: None Help needed moving from lying on your back to sitting on the side of a flat bed without using bedrails?: None Help needed moving to and from a bed to a chair (including a wheelchair)?: None Help needed standing up from a chair using your arms (e.g., wheelchair or bedside chair)?: None Help needed to walk in hospital room?: None Help needed climbing 3-5 steps with a railing? : None 6 Click Score: 24    End of Session   Activity Tolerance: Patient tolerated treatment well Patient left: in bed;with call bell/phone within reach;with bed alarm set Nurse Communication: Mobility status PT Visit Diagnosis: Muscle weakness (generalized) (M62.81)    Time: 8657-8645 PT Time Calculation (min) (ACUTE ONLY): 12 min   Charges:   PT Evaluation $PT Eval Low Complexity: 1 Low   PT General Charges $$ ACUTE PT VISIT: 1 Visit         Sabra Morel, PT, DPT  Acute Rehabilitation Services         Office: [phone]     Sabra MARLA Morel 08/20/2024, 4:17 PM

## 2024-08-20 NOTE — Discharge Instructions (Signed)
 Low Sodium Nutrition Therapy  Eating less sodium can help you if you have high blood pressure, heart failure, or kidney or liver disease.   Your body needs a little sodium, but too much sodium can cause your body to hold onto extra water. This extra water will raise your blood pressure and can cause damage to your heart, kidneys, or liver as they are forced to work harder.   Sometimes you can see how the extra fluid affects you because your hands, legs, or belly swell. You may also hold water around your heart and lungs, which makes it hard to breathe.   Even if you take medication for blood pressure or a water pill (diuretic) to remove fluid, it is still important to have less salt in your diet.   Check with your primary care provider before drinking alcohol since it may affect the amount of fluid in your body and how your heart, kidneys, or liver work. Sodium in Food A low-sodium meal plan limits the sodium that you get from food and beverages to 1,500-2,000 milligrams (mg) per day. Salt is the main source of sodium. Read the nutrition label on the package to find out how much sodium is in one serving of a food.  . Select foods with 140 milligrams (mg) of sodium or less per serving.  . You may be able to eat one or two servings of foods with a little more than 140 milligrams (mg) of sodium if you are closely watching how much sodium you eat in a day.  . Check the serving size on the label. The amount of sodium listed on the label shows the amount in one serving of the food. So, if you eat more than one serving, you will get more sodium than the amount listed.  Tips Cutting Back on Sodium . Eat more fresh foods.  . Fresh fruits and vegetables are low in sodium, as well as frozen vegetables and fruits that have no added juices or sauces.  . Fresh meats are lower in sodium than processed meats, such as bacon, sausage, and hotdogs.  . Not all processed foods are unhealthy, but some processed foods  may have too much sodium.  . Eat less salt at the table and when cooking. One of the ingredients in salt is sodium.  . One teaspoon of table salt has 2,300 milligrams of sodium.  . Leave the salt out of recipes for pasta, casseroles, and soups. . Be a smart shopper.  . Food packages that say "Salt-free", sodium-free", "very low sodium," and "low sodium" have less than 140 milligrams of sodium per serving.  . Beware of products identified as "Unsalted," "No Salt Added," "Reduced Sodium," or "Lower Sodium." These items may still be high in sodium. You should always check the nutrition label. . Add flavors to your food without adding sodium.  . Try lemon juice, lime juice, or vinegar.  . Dry or fresh herbs add flavor.  Jon [name] a sodium-free seasoning blend or make your own at home. . You can purchase salt-free or sodium-free condiments like barbeque sauce in stores and online. Ask your registered dietitian nutritionist for recommendations and where to find them.  .  Eating in Restaurants . Choose foods carefully when you eat outside your home. Restaurant foods can be very high in sodium. Many restaurants provide nutrition facts on their menus or their websites. If you cannot find that information, ask your server. Let your server know that you want your food  to be cooked without salt and that you would like your salad dressing and sauces to be served on the side.  .   . Foods Recommended . Food Group . Foods Recommended  . Grains . Bread, bagels, rolls without salted tops Homemade bread made with reduced-sodium baking powder Cold cereals, especially shredded wheat and puffed rice Oats, grits, or cream of wheat Pastas, quinoa, and rice Popcorn, pretzels or crackers without salt Corn tortillas  . Protein Foods . Fresh meats and fish; [place] bacon (check the nutrition labels - make sure they are not packaged in a sodium solution) Canned or packed tuna (no more than 4 ounces at 1 serving) Beans  and peas Soybeans) and tofu Eggs Nuts or nut butters without salt  . Dairy . Milk or milk powder Plant milks, such as rice and soy Yogurt, including Greek yogurt Small amounts of natural cheese (blocks of cheese) or reduced-sodium cheese can be used in moderation. (Swiss, ricotta, and fresh mozzarella cheese are lower in sodium than the others) Cream Cheese Low sodium cottage cheese  . Vegetables . Fresh and frozen vegetables without added sauces or salt Homemade soups (without salt) Low-sodium, salt-free or sodium-free canned vegetables and soups  . Fruit . Fresh and canned fruits Dried fruits, such as raisins, cranberries, and prunes  . Oils . Tub or liquid margarine, regular or without salt [place], corn, peanut, olive, safflower, or sunflower oils  . Condiments . Fresh or dried herbs such as basil, bay leaf, dill, mustard (dry), nutmeg, paprika, parsley, rosemary, sage, or thyme.  Low sodium ketchup Vinegar  Lemon or lime juice Pepper, red pepper flakes, and cayenne. Hot sauce contains sodium, but if you use just a drop or two, it will not add up to much.  Salt-free or sodium-free seasoning mixes and marinades Simple salad dressings: vinegar and oil  .  Marland Kitchen Foods Not Recommended . Food Group . Foods Not Recommended  . Grains . Breads or crackers topped with salt Cereals (hot/cold) with more than 300 mg sodium per serving Biscuits, cornbread, and other "quick" breads prepared with baking soda Pre-packaged bread crumbs Seasoned and packaged rice and pasta mixes Self-rising flours  . Protein Foods . Cured meats: Bacon, ham, sausage, pepperoni and hot dogs Canned meats (chili, vienna sausage, or sardines) Smoked fish and meats Frozen meals that have more than 600 mg of sodium per serving Egg substitute (with added sodium)  . Dairy . Buttermilk Processed cheese spreads Cottage cheese (1 cup may have over 500 mg of sodium; look for low-sodium.) American or feta cheese Shredded  Cheese has more sodium than blocks of cheese String cheese  . Vegetables . Canned vegetables (unless they are salt-free, sodium-free or low sodium) Frozen vegetables with seasoning and sauces Sauerkraut and pickled vegetables Canned or dried soups (unless they are salt-free, sodium-free, or low sodium) [place] fries and onion rings  . Fruit . Dried fruits preserved with additives that have sodium  . Oils . Salted butter or margarine, all types of olives  . Condiments . Salt, sea salt, kosher salt, onion salt, and garlic salt Seasoning mixes with salt Bouillon cubes Ketchup Barbeque sauce and Worcestershire sauce unless low sodium Soy sauce Salsa, pickles, olives, relish Salad dressings: ranch, blue cheese, [place], and [place].  .  . Low Sodium Sample 1-Day Menu  . Breakfast . 1 cup cooked oatmeal  . 1 slice whole wheat bread toast  . 1 tablespoon peanut butter without salt  . 1 banana  .  1 cup 1% milk  . Lunch . Tacos made with: 2 corn tortillas  .  cup black beans, low sodium  .  cup roasted or grilled chicken (without skin)  .  avocado  . Squeeze of lime juice  . 1 cup salad greens  . 1 tablespoon low-sodium salad dressing  .  cup strawberries  . 1 orange  . Afternoon Snack . 1/3 cup grapes  . 6 ounces yogurt  . Evening Meal . 3 ounces herb-baked fish  . 1 baked potato  . 2 teaspoons olive oil  .  cup cooked carrots  . 2 thick slices tomatoes on:  . 2 lettuce leaves  . 1 teaspoon olive oil  . 1 teaspoon balsamic vinegar  . 1 cup 1% milk  . Evening Snack . 1 apple  .  cup almonds without salt  .  Marland Kitchen Low-Sodium Vegetarian (Lacto-Ovo) Sample 1-Day Menu  . Breakfast . 1 cup cooked oatmeal  . 1 slice whole wheat toast  . 1 tablespoon peanut butter without salt  . 1 banana  . 1 cup 1% milk  . Lunch . Tacos made with: 2 corn tortillas  .  cup black beans, low sodium  .  cup roasted or grilled chicken (without skin)  .  avocado  . Squeeze of lime juice  . 1  cup salad greens  . 1 tablespoon low-sodium salad dressing  .  cup strawberries  . 1 orange  . Evening Meal . Stir fry made with:  cup tofu  . 1 cup brown rice  .  cup broccoli  .  cup green beans  .  cup peppers  .  tablespoon peanut oil  . 1 orange  . 1 cup 1% milk  . Evening Snack . 4 strips celery  . 2 tablespoons hummus  . 1 hard-boiled egg  .  Marland Kitchen Low-Sodium Vegan Sample 1-Day Menu  . Breakfast . 1 cup cooked oatmeal  . 1 tablespoon peanut butter without salt  . 1 cup blueberries  . 1 cup soymilk fortified with calcium, vitamin B12, and vitamin D  . Lunch . 1 small whole wheat pita  .  cup cooked lentils  . 2 tablespoons hummus  . 4 carrot sticks  . 1 medium apple  . 1 cup soymilk fortified with calcium, vitamin B12, and vitamin D  . Evening Meal . Stir fry made with:  cup tofu  . 1 cup brown rice  .  cup broccoli  .  cup green beans  .  cup peppers  .  tablespoon peanut oil  . 1 cup cantaloupe  . Evening Snack . 1 cup soy yogurt  .  cup mixed nuts  . Copyright 2020  Academy of [employer] and Dietetics. All rights reserved .  Marland Kitchen Sodium Free Flavoring Tips .  Marland Kitchen When cooking, the following items may be used for flavoring instead of salt or seasonings that contain sodium. . Remember: A little bit of spice goes a long way! Be careful not to overseason. Marland Kitchen Spice Blend Recipe (makes about ? cup) . 5 teaspoons onion powder  . 2 teaspoons garlic powder  . 2 teaspoons paprika  . 2 teaspoon dry mustard  . 1 teaspoon crushed thyme leaves  .  teaspoon white pepper  .  teaspoon celery seed Food Item Flavorings  Beef Basil, bay leaf, caraway, curry, dill, dry mustard, garlic, grape jelly, green pepper, mace, marjoram, mushrooms (fresh), nutmeg, onion or onion powder, parsley, pepper,  rosemary, sage  Chicken Basil, cloves, cranberries, mace, mushrooms (fresh), nutmeg, oregano, paprika, parsley, pineapple, saffron, sage, savory, tarragon, thyme, tomato,  turmeric  Egg Chervil, curry, dill, dry mustard, garlic or garlic powder, green pepper, jelly, mushrooms (fresh), nutmeg, onion powder, paprika, parsley, rosemary, tarragon, tomato  Fish Basil, bay leaf, chervil, curry, dill, dry mustard, green pepper, lemon juice, marjoram, mushrooms (fresh), paprika, pepper, tarragon, tomato, turmeric  Lamb Cloves, curry, dill, garlic or garlic powder, mace, mint, mint jelly, onion, oregano, parsley, pineapple, rosemary, tarragon, thyme  Pork Applesauce, basil, caraway, chives, cloves, garlic or garlic powder, onion or onion powder, rosemary, thyme  Veal Apricots, basil, bay leaf, currant jelly, curry, ginger, marjoram, mushrooms (fresh), oregano, paprika  Vegetables Basil, dill, garlic or garlic powder, ginger, lemon juice, mace, marjoram, nutmeg, onion or onion powder, tarragon, tomato, sugar or sugar substitute, salt-free salad dressing, vinegar  Desserts Allspice, anise, cinnamon, cloves, ginger, mace, nutmeg, vanilla extract, other extracts   Copyright 2020  Academy of Nutrition and Dietetics. All rights reserved

## 2024-08-20 NOTE — Plan of Care (Signed)

## 2024-08-20 NOTE — Plan of Care (Signed)
" °  Problem: Education: Goal: Knowledge of disease or condition will improve Outcome: Progressing Goal: Knowledge of secondary prevention will improve (MUST DOCUMENT ALL) Outcome: Progressing Goal: Knowledge of patient specific risk factors will improve (DELETE if not current risk factor) Outcome: Progressing   Problem: Ischemic Stroke/TIA Tissue Perfusion: Goal: Complications of ischemic stroke/TIA will be minimized 08/20/2024 0548 by Grayson Aquas, RN Outcome: Progressing 08/20/2024 0530 by Grayson Aquas, RN Outcome: Progressing   Problem: Coping: Goal: Will verbalize positive feelings about self Outcome: Progressing Goal: Will identify appropriate support needs Outcome: Progressing   Problem: Health Behavior/Discharge Planning: Goal: Ability to manage health-related needs will improve Outcome: Progressing Goal: Goals will be collaboratively established with patient/family Outcome: Progressing   Problem: Self-Care: Goal: Ability to participate in self-care as condition permits will improve Outcome: Progressing Goal: Verbalization of feelings and concerns over difficulty with self-care will improve Outcome: Progressing Goal: Ability to communicate needs accurately will improve Outcome: Progressing   Problem: Nutrition: Goal: Risk of aspiration will decrease Outcome: Progressing Goal: Dietary intake will improve Outcome: Progressing   Problem: Education: Goal: Knowledge of General Education information will improve Description: Including pain rating scale, medication(s)/side effects and non-pharmacologic comfort measures Outcome: Progressing   Problem: Health Behavior/Discharge Planning: Goal: Ability to manage health-related needs will improve Outcome: Progressing   Problem: Clinical Measurements: Goal: Ability to maintain clinical measurements within normal limits will improve Outcome: Progressing Goal: Will remain free from infection Outcome: Progressing Goal:  Diagnostic test results will improve Outcome: Progressing Goal: Respiratory complications will improve Outcome: Progressing Goal: Cardiovascular complication will be avoided Outcome: Progressing   Problem: Activity: Goal: Risk for activity intolerance will decrease Outcome: Progressing   Problem: Nutrition: Goal: Adequate nutrition will be maintained Outcome: Progressing   Problem: Coping: Goal: Level of anxiety will decrease Outcome: Progressing   Problem: Elimination: Goal: Will not experience complications related to bowel motility Outcome: Progressing Goal: Will not experience complications related to urinary retention Outcome: Progressing   Problem: Pain Managment: Goal: General experience of comfort will improve and/or be controlled Outcome: Progressing   Problem: Safety: Goal: Ability to remain free from injury will improve Outcome: Progressing   Problem: Skin Integrity: Goal: Risk for impaired skin integrity will decrease Outcome: Progressing   "

## 2024-08-20 NOTE — Evaluation (Signed)
 Occupational Therapy Evaluation Patient Details Name: Jon [name] MRN: [id number] DOB: Dec 10, 1955 Today's Date: 08/20/2024   History of Present Illness   69 y.o. male with past medical history  of essential hypertension, nonischemic cardiomyopathy with severe LVH, history of CAD, history of acute blood loss anemia and GI bleed, CKD status post cadaveric transplant,coming from pcp office for right eye blurred vision and right leg weakness.  MRI of the brain noncontrast shows right cerebellar hemisphere acute infarct.     Clinical Impressions Patient admitted for the diagnosis above.  Patient reports resolution of symptoms.  Appears to be at his baseline for in room mobility/toileting, basic self care and demonstrates good safety.  No further OT needs in the acute setting.  No post acute OT anticipated.       If plan is discharge home, recommend the following:   Assist for transportation     Functional Status Assessment   Patient has not had a recent decline in their functional status     Equipment Recommendations         Recommendations for Other Services         Precautions/Restrictions   Precautions Precautions: None Recall of Precautions/Restrictions: Intact Restrictions Weight Bearing Restrictions Per Provider Order: No     Mobility Bed Mobility Overal bed mobility: Independent                  Transfers Overall transfer level: Independent Equipment used: None                      Balance Overall balance assessment: No apparent balance deficits (not formally assessed)                                         ADL either performed or assessed with clinical judgement   ADL Overall ADL's : At baseline                                             Vision Patient Visual Report: No change from baseline       Perception Perception: Within Functional Limits       Praxis Praxis: WFL        Pertinent Vitals/Pain Pain Assessment Pain Assessment: No/denies pain     Extremity/Trunk Assessment Upper Extremity Assessment Upper Extremity Assessment: Overall WFL for tasks assessed   Lower Extremity Assessment Lower Extremity Assessment: Defer to PT evaluation   Cervical / Trunk Assessment Cervical / Trunk Assessment: Normal   Communication Communication Communication: No apparent difficulties   Cognition Arousal: Alert Behavior During Therapy: Flat affect Cognition: No apparent impairments                               Following commands: Intact       Cueing  General Comments   Cueing Techniques: Verbal cues   VSS   Exercises     Shoulder Instructions      Home Living Family/patient expects to be discharged to:: Private residence Living Arrangements: Alone Available Help at Discharge: Family;Available PRN/intermittently Type of Home: House Home Access: Level entry     Home Layout: Two level;Able to live on main level with bedroom/bathroom  Bathroom Shower/Tub: Tub/shower unit;Walk-in shower   Bathroom Toilet: Standard Bathroom Accessibility: Yes How Accessible: Accessible via [place] Home Equipment: [place] (2 wheels)          Prior Functioning/Environment Prior Level of Function : Independent/Modified Independent                    OT Problem List: Impaired balance (sitting and/or standing)   OT Treatment/Interventions:        OT Goals(Current goals can be found in the care plan section)   Acute Rehab OT Goals Patient Stated Goal: Home OT Goal Formulation: With patient Time For Goal Achievement: 08/27/24 Potential to Achieve Goals: Good   OT Frequency:       Co-evaluation              AM-PAC OT 6 Clicks Daily Activity     Outcome Measure Help from another person eating meals?: None Help from another person taking care of personal grooming?: None Help from another person toileting,  which includes using toliet, bedpan, or urinal?: None Help from another person bathing (including washing, rinsing, drying)?: None Help from another person to put on and taking off regular upper body clothing?: None Help from another person to put on and taking off regular lower body clothing?: None 6 Click Score: 24   End of Session Nurse Communication: Mobility status  Activity Tolerance: Patient tolerated treatment well Patient left: in chair;with call bell/phone within reach  OT Visit Diagnosis: Muscle weakness (generalized) (M62.81)                Time: 0940-1001 OT Time Calculation (min): 21 min Charges:  OT General Charges $OT Visit: 1 Visit OT Evaluation $OT Eval Moderate Complexity: 1 Mod  08/20/2024  RP, OTR/L  Acute Rehabilitation Services  Office:  [phone]   Charlie [name] Halsted 08/20/2024, 10:04 AM

## 2024-08-20 NOTE — Progress Notes (Signed)
 " PROGRESS NOTE    Jon [name]  FMW:[id number] DOB: [birth year] DOA: 08/19/2024 PCP: [facility] Complaint  Patient presents with   Aphasia   Dizziness   Numbness   Tingling    Brief Narrative:   Jon [name] is a 69 y.o. male with past medical history  of essential hypertension, nonischemic cardiomyopathy with severe LVH, history of CAD, history of acute blood loss anemia and GI bleed, CKD status post cadaveric transplant, patient with recent hospitalization due to acute blood loss anemia from upper GI bleed secondary to Mallory-Weiss tear, with Eliquis  recommended to be held for 1 week upon discharge, he presents from his PCP office with new focal deficits, MRI brain significant for acute CVA.  SABRA  Assessment & Plan:   Principal Problem:   Acute CVA (cerebrovascular accident) (HCC)  Acute CVA, right cerebellar CVA -Per neurology -Eliquis  on hold due to recent GI bleed, was supposed to resume 08/20/2024, will await further recommendation regarding when okay to resume from acute CVA perspective) - 2D echo is pending. - PT/OT/SLP consults pending - Allow for permissive hypertension - LDL stable at 41, A1c stable at 5.8    Chronic blood loss anemia Recent upper GI bleed -Send admission 08/07/24- 08/12/24 for GIB from mallory weiss tear that was clipped. - Post resume Eliquis  08/20/2024. - Globin stable at baseline from recent discharge, with no further evidence of further GI bleed -Check anemia panel  PAF Off anticoagulation 2/2 to GIB.  PTA meds amiodarone  and metoprolol  will continue amiodarone .  Most recent TEE in September 2025 showed dilated left atrium severe LVH EF of 50 to 55%.   [id number] / Cadaveric renal transplant in 2018: -Received IV contrast with CTA head and neck, will keep on IV fluids -Currently on prednisone , prograf  and Cellcept  .   Hyperlipidemia -LDL at 41, continue with home statin  Hypothyroidism - TSH is significantly elevated, free T4  significantly low, which is diagnostic of hypothyroidism, will start on Synthroid  and uptitrate dose as tolerated    Hypotension H/o hypertension H/o NICM with severe LVH - Multiple cardiac medications, I will hold now given he received IV contrast, as well to allow permissive hypertension, and this includes Aldactone  metoprolol  and Jardiance   DVT prophylaxis: ([employer] heparin  ) Code Status: (Full) Family Communication: none at bedside Disposition:   Status is: Inpatient    Consultants:  Neurology   Subjective:  He still reports some dizziness and unsteady gait, but reports weakness much improved  Objective: Vitals:   08/20/24 0000 08/20/24 0026 08/20/24 0200 08/20/24 0855  BP:  (!) 147/80  (!) 154/91  Pulse:  (!) 53 (!) 56 61  Resp: 11  15 19   Temp:  97.7 F (36.5 C)  97.8 F (36.6 C)  TempSrc:  Oral  Oral  SpO2:  99% 98% 98%  Weight:        Intake/Output Summary (Last 24 hours) at 08/20/2024 1142 Last data filed at 08/20/2024 0544 Gross per 24 hour  Intake 755.19 ml  Output 2250 ml  Net -1494.81 ml   Filed Weights   08/19/24 1300  Weight: 112.4 kg    Examination:  Awake Alert, Oriented X 3 CTAB RRR +ve B.Sounds, Abd Soft No Cyanosis, Clubbing or edema, No new Rash or bruise     Data Reviewed: I have personally reviewed following labs and imaging studies  CBC: Recent Labs  Lab 08/19/24 1323 08/19/24 1325 08/20/24 0621  WBC  --  6.2 6.5  NEUTROABS  --  4.6  --   HGB 7.8* 7.5* 8.7*  HCT 23.0* 24.6* 28.6*  MCV  --  96.5 93.8  PLT  --  164 175    Basic Metabolic Panel: Recent Labs  Lab 08/19/24 1323 08/19/24 1325 08/19/24 1912 08/20/24 0621  NA 143 141  --  136  K 3.6 3.8  --  4.7  CL 113* 114*  --  108  CO2  --  17*  --  20*  GLUCOSE 90 91  --  110*  BUN 9 10  --  10  CREATININE 1.30* 1.22  --  1.43*  CALCIUM   --  7.1*  --  8.5*  MG  --   --  2.2  --     GFR: Estimated Creatinine Clearance: 65.8 mL/min (A) (by C-G formula based on  SCr of 1.43 mg/dL (H)).  Liver Function Tests: Recent Labs  Lab 08/19/24 1325  AST 20  ALT 9  ALKPHOS 43  BILITOT 0.2  PROT 4.9*  ALBUMIN  3.3*    CBG: Recent Labs  Lab 08/19/24 1317  GLUCAP 108*     No results found for this or any previous visit (from the past 240 hours).       Radiology Studies: MR BRAIN WO CONTRAST Result Date: 08/19/2024 EXAM: MRI BRAIN WITHOUT CONTRAST 08/19/2024 01:49:40 PM TECHNIQUE: Multiplanar multisequence MRI of the head/brain was performed without the administration of intravenous contrast. COMPARISON: CT of the head and CT angiogram of the head and neck dated 08/19/2024. CLINICAL HISTORY: Neuro deficit, acute, stroke suspected. FINDINGS: BRAIN AND VENTRICLES: Subtle restricted diffusion present anteriorly and superiorly within the right cerebellar hemisphere, which is demonstrated on images 17 through 20 of series 2. No intracranial hemorrhage. No mass. No midline shift. No hydrocephalus. The sella is unremarkable. Normal flow voids. Age-related atrophy. Mild-to-moderate cerebral white matter disease. The study is degraded by patient motion. ORBITS: No significant abnormality. SINUSES AND MASTOIDS: Moderate circumferential mucosal disease within the left maxillary sinus. BONES AND SOFT TISSUES: Normal marrow signal. No soft tissue abnormality. IMPRESSION: 1. Subtle restricted diffusion in the right cerebellar hemisphere, suspicious for acute infarct. 2. Age-related atrophy and mild-to-moderate cerebral white matter disease; study degraded by patient motion. 3. Moderate circumferential mucosal disease within the left maxillary sinus. Electronically signed by: Evalene Coho MD 08/19/2024 02:42 PM EST RP Workstation: [id number]   CT ANGIO HEAD NECK W WO CM (CODE STROKE) Result Date: 08/19/2024 EXAM: CTA HEAD AND NECK WITHOUT AND WITH 08/19/2024 01:33:30 PM TECHNIQUE: CTA of the head and neck was performed without and with the administration of 75 mL of  iohexol  (OMNIPAQUE ) 350 MG/ML injection. Multiplanar 2D and/or 3D reformatted images are provided for review. Automated exposure control, iterative reconstruction, and/or weight based adjustment of the mA/kV was utilized to reduce the radiation dose to as low as reasonably achievable. Stenosis of the internal carotid arteries measured using NASCET criteria. COMPARISON: Same day CT head. CLINICAL HISTORY: Neuro deficit, acute, stroke suspected. FINDINGS: CTA NECK: AORTIC ARCH AND ARCH VESSELS: Mild atherosclerosis of the aortic arch. Atherosclerosis at the left subclavian artery origin without stenosis. Atherosclerosis along the proximal right subclavian resulting in mild stenosis. No dissection or arterial injury. CERVICAL CAROTID ARTERIES: Mild atherosclerosis at the right carotid bifurcation without hemodynamically significant stenosis by NASCET criteria. No dissection or arterial injury. CERVICAL VERTEBRAL ARTERIES: Small caliber of the bilateral vertebral arteries. There is somewhat limited evaluation of the V1 and proximal V2 segments due to streak artifact and small  vessel caliber. Visualized portions of the vertebral arteries are patent to the vertebrobasilar confluence. No dissection or arterial injury. LUNGS AND MEDIASTINUM: Patulous appearance of the upper thoracic esophagus. Lungs are unremarkable. SOFT TISSUES: Subcentimeter nodules in the thyroid. Mucosal thickening in the left maxillary sinus with findings suggestive of chronic sinusitis. BONES: Degenerative changes in the visualized cervical spine. CTA HEAD: ANTERIOR CIRCULATION: Atherosclerosis of the carotid siphons without significant stenosis. No significant stenosis of the anterior cerebral arteries. No significant stenosis of the middle cerebral arteries. No aneurysm. POSTERIOR CIRCULATION: The basilar artery is patent but demonstrates significant distal tapering and essentially terminates at the origins of the superior cerebellar arteries. The  posterior cerebral arteries are mainly supplied via the posterior communicating arteries. No significant stenosis of the vertebral arteries. No aneurysm. OTHER: No dural venous sinus thrombosis on this non-dedicated study. IMPRESSION: 1. No large vessel occlusion, hemodynamically significant stenosis, or aneurysm in the head or neck. 2. Significant distal tapering of the basilar artery, essentially terminating at the origins of the superior cerebellar arteries, with the posterior cerebral arteries mainly supplied via the posterior communicating arteries. 3. Small caliber bilateral vertebral arteries with limited evaluation of the V1 and proximal V2 segments due to streak artifact. 4. Mild atherosclerosis as above. Electronically signed by: Donnice Mania MD 08/19/2024 02:08 PM EST RP Workstation: [id number]   CT HEAD CODE STROKE WO CONTRAST (LKW 0-4.5h, LVO 0-24h) Result Date: 08/19/2024 EXAM: CT HEAD WITHOUT CONTRAST 08/19/2024 01:25:23 PM TECHNIQUE: CT of the head was performed without the administration of intravenous contrast. Automated exposure control, iterative reconstruction, and/or weight based adjustment of the mA/kV was utilized to reduce the radiation dose to as low as reasonably achievable. COMPARISON: CT head 07/21/2023. CLINICAL HISTORY: Neuro deficit, acute, stroke suspected. FINDINGS: BRAIN AND VENTRICLES: No acute hemorrhage. No evidence of acute infarct. Remote lacunar infarct in the left centrum semiovale. Mild periventricular white matter changes. No hydrocephalus. No extra-axial collection. No mass effect or midline shift. Atherosclerosis of the carotid siphons. ORBITS: No acute abnormality. SINUSES: Left maxillary sinus mucosal thickening. SOFT TISSUES AND SKULL: No acute soft tissue abnormality. No skull fracture. [place] Stroke Program Early CT (ASPECT) Score: Ganglionic (caudate, IC, lentiform nucleus, insula, M1-M3): 7 Supraganglionic (M4-M6): 3 Total: 10 IMPRESSION: 1. No acute  intracranial abnormality. 2. ASPECTS 10. 3. Remote lacunar infarct in the left centrum semiovale. 4. Findings messaged to Dr. Arora via the [facility] messaging system at 1:45 pm on 08/19/24. Electronically signed by: Donnice Mania MD 08/19/2024 01:50 PM EST RP Workstation: [id number]        Scheduled Meds:  amiodarone   200 mg Oral Daily   atorvastatin   20 mg Oral QHS   brimonidine   1 drop Both Eyes Q12H   And   timolol   1 drop Both Eyes Q12H   latanoprost   1 drop Both Eyes QHS   mycophenolate   1,000 mg Oral BID   pantoprazole  (PROTONIX ) IV  40 mg Intravenous Q12H   predniSONE   5 mg Oral Q breakfast   sucralfate   1 g Oral TID AC   tacrolimus   3 mg Oral q morning   And   tacrolimus   2 mg Oral QHS   tamsulosin   0.4 mg Oral QHS   Continuous Infusions:  sodium chloride  50 mL/hr at 08/20/24 0544     LOS: 1 day       Brayton Lye, MD [facility]   To contact the attending provider between 7A-7P or the covering provider during after hours 7P-7A, please log into  the web site [email or web address] and access using universal [place] password for that web site. If you do not have the password, please call the hospital operator.  08/20/2024, 11:42 AM   "

## 2024-08-20 NOTE — Progress Notes (Signed)
 STROKE TEAM PROGRESS NOTE   SIGNIFICANT HOSPITAL EVENTS 1/21:  - Presented with right arm numbness, right-sided weakness, and dysarthria - MRI brain with subtle restricted diffusion in the right cerebellar hemisphere concerning for acute infarct - CTA with no LVO or hemodynamically significant stenosis in the head or neck   INTERIM HISTORY/SUBJECTIVE Patient resting comfortably in bed, no family at bedside.  Cardiology at bedside completing echocardiogram on examiner arrival.  Patient states that he has no further neurologic deficits and is back to his baseline.  OBJECTIVE CBC    Component Value Date/Time   WBC 6.5 08/20/2024 0621   RBC 3.05 (L) 08/20/2024 0621   HGB 8.7 (L) 08/20/2024 0621   HCT 28.6 (L) 08/20/2024 0621   PLT 175 08/20/2024 0621   MCV 93.8 08/20/2024 0621   MCH 28.5 08/20/2024 0621   MCHC 30.4 08/20/2024 0621   RDW 16.0 (H) 08/20/2024 0621   LYMPHSABS 1.0 08/19/2024 1325   MONOABS 0.5 08/19/2024 1325   EOSABS 0.1 08/19/2024 1325   BASOSABS 0.0 08/19/2024 1325   BMET    Component Value Date/Time   NA 136 08/20/2024 0621   NA 139 04/23/2024 0947   K 4.7 08/20/2024 0621   CL 108 08/20/2024 0621   CO2 20 (L) 08/20/2024 0621   GLUCOSE 110 (H) 08/20/2024 0621   BUN 10 08/20/2024 0621   BUN 15 04/23/2024 0947   CREATININE 1.43 (H) 08/20/2024 0621   CALCIUM  8.5 (L) 08/20/2024 0621   EGFR 40.0 04/29/2024 0000   EGFR 43 (L) 04/23/2024 0947   GFRNONAA 54 (L) 08/20/2024 0621   Lab Results  Component Value Date   HGBA1C 5.8 (H) 08/19/2024   Lab Results  Component Value Date   CHOL 101 08/19/2024   HDL 38 (L) 08/19/2024   LDLCALC 41 08/19/2024   TRIG 110 08/19/2024   CHOLHDL 2.7 08/19/2024   Drugs of Abuse     Component Value Date/Time   LABOPIA NEGATIVE 08/19/2024 1436   COCAINSCRNUR NEGATIVE 08/19/2024 1436   LABBENZ NEGATIVE 08/19/2024 1436   AMPHETMU NEGATIVE 08/19/2024 1436   THCU NEGATIVE 08/19/2024 1436   LABBARB NEGATIVE 08/19/2024 1436    IMAGING past 24 hours MR BRAIN WO CONTRAST Result Date: 08/19/2024 EXAM: MRI BRAIN WITHOUT CONTRAST 08/19/2024 01:49:40 PM TECHNIQUE: Multiplanar multisequence MRI of the head/brain was performed without the administration of intravenous contrast. COMPARISON: CT of the head and CT angiogram of the head and neck dated 08/19/2024. CLINICAL HISTORY: Neuro deficit, acute, stroke suspected. FINDINGS: BRAIN AND VENTRICLES: Subtle restricted diffusion present anteriorly and superiorly within the right cerebellar hemisphere, which is demonstrated on images 17 through 20 of series 2. No intracranial hemorrhage. No mass. No midline shift. No hydrocephalus. The sella is unremarkable. Normal flow voids. Age-related atrophy. Mild-to-moderate cerebral white matter disease. The study is degraded by patient motion. ORBITS: No significant abnormality. SINUSES AND MASTOIDS: Moderate circumferential mucosal disease within the left maxillary sinus. BONES AND SOFT TISSUES: Normal marrow signal. No soft tissue abnormality. IMPRESSION: 1. Subtle restricted diffusion in the right cerebellar hemisphere, suspicious for acute infarct. 2. Age-related atrophy and mild-to-moderate cerebral white matter disease; study degraded by patient motion. 3. Moderate circumferential mucosal disease within the left maxillary sinus. Electronically signed by: Evalene Coho MD 08/19/2024 02:42 PM EST RP Workstation: [id number]   CT ANGIO HEAD NECK W WO CM (CODE STROKE) Result Date: 08/19/2024 EXAM: CTA HEAD AND NECK WITHOUT AND WITH 08/19/2024 01:33:30 PM TECHNIQUE: CTA of the head and neck was performed without and  with the administration of 75 mL of iohexol  (OMNIPAQUE ) 350 MG/ML injection. Multiplanar 2D and/or 3D reformatted images are provided for review. Automated exposure control, iterative reconstruction, and/or weight based adjustment of the mA/kV was utilized to reduce the radiation dose to as low as reasonably achievable. Stenosis of the  internal carotid arteries measured using NASCET criteria. COMPARISON: Same day CT head. CLINICAL HISTORY: Neuro deficit, acute, stroke suspected. FINDINGS: CTA NECK: AORTIC ARCH AND ARCH VESSELS: Mild atherosclerosis of the aortic arch. Atherosclerosis at the left subclavian artery origin without stenosis. Atherosclerosis along the proximal right subclavian resulting in mild stenosis. No dissection or arterial injury. CERVICAL CAROTID ARTERIES: Mild atherosclerosis at the right carotid bifurcation without hemodynamically significant stenosis by NASCET criteria. No dissection or arterial injury. CERVICAL VERTEBRAL ARTERIES: Small caliber of the bilateral vertebral arteries. There is somewhat limited evaluation of the V1 and proximal V2 segments due to streak artifact and small vessel caliber. Visualized portions of the vertebral arteries are patent to the vertebrobasilar confluence. No dissection or arterial injury. LUNGS AND MEDIASTINUM: Patulous appearance of the upper thoracic esophagus. Lungs are unremarkable. SOFT TISSUES: Subcentimeter nodules in the thyroid. Mucosal thickening in the left maxillary sinus with findings suggestive of chronic sinusitis. BONES: Degenerative changes in the visualized cervical spine. CTA HEAD: ANTERIOR CIRCULATION: Atherosclerosis of the carotid siphons without significant stenosis. No significant stenosis of the anterior cerebral arteries. No significant stenosis of the middle cerebral arteries. No aneurysm. POSTERIOR CIRCULATION: The basilar artery is patent but demonstrates significant distal tapering and essentially terminates at the origins of the superior cerebellar arteries. The posterior cerebral arteries are mainly supplied via the posterior communicating arteries. No significant stenosis of the vertebral arteries. No aneurysm. OTHER: No dural venous sinus thrombosis on this non-dedicated study. IMPRESSION: 1. No large vessel occlusion, hemodynamically significant stenosis,  or aneurysm in the head or neck. 2. Significant distal tapering of the basilar artery, essentially terminating at the origins of the superior cerebellar arteries, with the posterior cerebral arteries mainly supplied via the posterior communicating arteries. 3. Small caliber bilateral vertebral arteries with limited evaluation of the V1 and proximal V2 segments due to streak artifact. 4. Mild atherosclerosis as above. Electronically signed by: Donnice Mania MD 08/19/2024 02:08 PM EST RP Workstation: [id number]   CT HEAD CODE STROKE WO CONTRAST (LKW 0-4.5h, LVO 0-24h) Result Date: 08/19/2024 EXAM: CT HEAD WITHOUT CONTRAST 08/19/2024 01:25:23 PM TECHNIQUE: CT of the head was performed without the administration of intravenous contrast. Automated exposure control, iterative reconstruction, and/or weight based adjustment of the mA/kV was utilized to reduce the radiation dose to as low as reasonably achievable. COMPARISON: CT head 07/21/2023. CLINICAL HISTORY: Neuro deficit, acute, stroke suspected. FINDINGS: BRAIN AND VENTRICLES: No acute hemorrhage. No evidence of acute infarct. Remote lacunar infarct in the left centrum semiovale. Mild periventricular white matter changes. No hydrocephalus. No extra-axial collection. No mass effect or midline shift. Atherosclerosis of the carotid siphons. ORBITS: No acute abnormality. SINUSES: Left maxillary sinus mucosal thickening. SOFT TISSUES AND SKULL: No acute soft tissue abnormality. No skull fracture. [place] Stroke Program Early CT (ASPECT) Score: Ganglionic (caudate, IC, lentiform nucleus, insula, M1-M3): 7 Supraganglionic (M4-M6): 3 Total: 10 IMPRESSION: 1. No acute intracranial abnormality. 2. ASPECTS 10. 3. Remote lacunar infarct in the left centrum semiovale. 4. Findings messaged to Dr. Arora via the [facility] messaging system at 1:45 pm on 08/19/24. Electronically signed by: Donnice Mania MD 08/19/2024 01:50 PM EST RP Workstation: [id number]   Vitals:   08/19/24 2028  08/20/24 0000 08/20/24  0026 08/20/24 0200  BP: 112/71  (!) 147/80   Pulse:   (!) 53 (!) 56  Resp:  11  15  Temp: (!) 97.3 F (36.3 C)  97.7 F (36.5 C)   TempSrc: Oral  Oral   SpO2:   99% 98%  Weight:       PHYSICAL EXAM General:  Alert, well-nourished, well-developed patient in no acute distress Psych:  Mood and affect appropriate for situation CV: Regular rate and rhythm on monitor Respiratory:  Regular, unlabored respirations on room air GI: Abdomen soft and nontender  NEURO:  Mental Status: AA&Ox3, patient is able to give clear and coherent history Speech/Language: speech is without dysarthria or aphasia, patient reports speech is at baseline.  Naming, repetition, fluency, and comprehension intact.  Cranial Nerves:  II: PERRL. Visual fields full.  III, IV, VI: EOMI. Eyelids elevate symmetrically.  V: Sensation is intact to light touch and symmetrical to face.  VII: Face is symmetrical resting and and with movement VIII: Hearing intact to voice. IX, X: Palate elevates symmetrically. Phonation is normal.  XI: Shoulder shrug 5/5. XII: Tongue is midline without fasciculations. Motor: 5/5 strength to all muscle groups tested.  Tone: is normal and bulk is normal Sensation: Intact to light touch bilaterally. Extinction absent to light touch to DSS.   Coordination: FTN intact bilaterally, HKS: no ataxia in BLE.No drift.  Gait: Deferred  ASSESSMENT/PLAN Jon [name] is a 69 y.o. male with history of CKD s/p transplant, GERD, HTN, NICM with severe LVH, remote MI, PAF previously on Eliquis , and recent GIB with resultant pause in Eliquis  (1/9-1/14) presenting with dysarthria, right arm numbness, and right-sided weakness witnessed onset at his PCP office.  MRI on arrival shows a right SCA territory infarct.   Stroke: R SCA small infarct, etiology:  likely large vessel disease from hypoplastic posterior circulation in the setting of anemia, less likely cardioembolic CT Head: No acute  intracranial abnormality.  Aspects 10.  Remote lacunar infarct in the left centrum semiovale. CTA head & neck: Significant distal tapering of the basilar artery, essentially terminating at the origins of the superior cerebellar arteries, with the posterior cerebral arteries mainly supplied via the posterior communicating arteries.  Small caliber bilateral vertebral arteries with limited evaluation of the V1 and proximal V2 segments due to streak artifact.  MRI: Subtle restricted diffusion in the right cerebellar hemisphere, suspicious for acute infarct.   2D Echo EF 55-60% LDL 41 HgbA1c 5.8 UDS neg VTE prophylaxis - subcu heparin  No antithrombotic prior to admission, now on No antithrombotic given recent GIB from Mallory-Weiss tear s/p clipping with admission 1/9 -1/14. GI was OK to restart eliquis  in one week after discharge, so will restart today.   Therapy recommendations:  Pending Disposition:  Pending   Paroxysmal Atrial fibrillation Home Meds: Eliquis  amiodarone  Recently held Eliquis  due to admission 1/9 - 1/14 for GIB related to a Mallory weiss tear s/p clipping  Continue telemetry monitoring Per primary team, GI was OK to restart eliquis  in one week after discharge, so will restart today.    Hypertension Home meds: Hydralazine , Lopressor , spironolactone  Stable Avoid low BP Long-term blood pressure goal normotension  Hyperlipidemia Home meds: Atorvastatin  20 mg, resumed in hospital LDL 41, goal < 70 High intensity statin not indicated patient is at goal with lower statin dose Continue statin at discharge  Other Stroke Risk Factors Obesity, Body mass index is 33.61 kg/m., BMI >/= 30 associated with increased stroke risk, recommend weight loss, diet and exercise as  appropriate  CAD, NICM with severe LVH, previous MI TEE 03/2024 with EF 50-55%, severe left atrial dilation, and severe LVH  Other Active Problems ESRD s/p transplant On tacrolimus , CellCept , and  prednisone   Hospital day # 1  Mimi Ny, AGACNP-BC [facility] Pager: [phone]  ATTENDING NOTE: I reviewed above note and agree with the assessment and plan. Pt was seen and examined.   No family at the bedside. Pt lying in bed, eating ice cream. Stated that his speech has back to baseline, and R weakness has resolved.  On exam, he is wake, alert, eyes open, orientated to age, place, time. No aphasia, fluent language with accent, following all simple commands. Able to name and repeat. No gaze palsy, tracking bilaterally, visual field full. No facial droop. Tongue midline. Bilateral UEs 5/5, no drift. Bilaterally LEs 5/5, no drift. Sensation symmetrical bilaterally, b/l FTN and HTS intact, gait not tested.   For detailed assessment and plan, please refer to above as I have made changes wherever appropriate.   Neurology will sign off. Please call with questions. Pt will follow up with stroke clinic NP at [facility] in about 4 weeks. Thanks for the consult.   Ary Cummins, MD PhD Stroke Neurology 08/20/2024 4:34 PM     To contact Stroke Continuity provider, please refer to [email or web address]. After hours, contact General Neurology

## 2024-08-20 NOTE — TOC Initial Note (Signed)
 Transition of Care [facility]) - Initial/Assessment Note    Patient Details  Name: Jon [name] MRN: [id number] Date of Birth: 1955/11/09  Transition [facility]) CM/SW Contact:    Landry DELENA Senters, RN Phone Number: 08/20/2024, 1:18 PM  Clinical Narrative:                  RR:ejdu medical history  of essential hypertension, nonischemic cardiomyopathy with severe LVH, history of CAD, history of acute blood loss anemia and GI bleed, CKD status post cadaveric transplant,coming from pcp office for right eye blurred vision and right leg weakness when at his pcp office where he had bloodwork today at 12:08 pm.   Patient lives alone, reports having a brother and sister for support, one of them will be transportation home at discharge.   Patient reports he drives, has PCP, sister manages medications,  no home DME.   Currently waiting for therapy evals.   CM will continue to follow.   Expected Discharge Plan:  (TBD) Barriers to Discharge: Continued Medical Work up   Patient Goals and CMS Choice            Expected Discharge Plan and Services       Living arrangements for the past 2 months: Single Family Home                                      Prior Living Arrangements/Services Living arrangements for the past 2 months: Single Family Home Lives with:: Self Patient language and need for interpreter reviewed:: Yes Do you feel safe going back to the place where you live?: Yes      Need for Family Participation in Patient Care: Yes (Comment) Care giver support system in place?: Yes (comment)   Criminal Activity/Legal Involvement Pertinent to Current Situation/Hospitalization: No - Comment as needed  Activities of Daily Living   ADL Screening (condition at time of admission) Independently performs ADLs?: Yes (appropriate for developmental age) Is the patient deaf or have difficulty hearing?: No Does the patient have difficulty seeing, even when wearing glasses/contacts?:  No Does the patient have difficulty concentrating, remembering, or making decisions?: No  Permission Sought/Granted                  Emotional Assessment Appearance:: Developmentally appropriate Attitude/Demeanor/Rapport: Engaged Affect (typically observed): Calm Orientation: : Oriented to Self, Oriented to Place, Oriented to  Time, Oriented to Situation Alcohol / Substance Use: Not Applicable Psych Involvement: No (comment)  Admission diagnosis:  Acute CVA (cerebrovascular accident) (HCC) [I63.9] Cerebrovascular accident (CVA), unspecified mechanism (HCC) [I63.9] Patient Active Problem List   Diagnosis Date Noted   Acute CVA (cerebrovascular accident) (HCC) 08/19/2024   Melena 08/08/2024   Acute blood loss anemia 08/08/2024   Abdominal pain, chronic, epigastric 08/08/2024   Hematemesis with nausea 08/08/2024   Mallory-Weiss tear 08/08/2024   Upper GI bleed 08/07/2024   AKI (acute kidney injury) 04/18/2024   LVH (left ventricular hypertrophy) 04/18/2024   CKD stage 3a, GFR 45-59 ml/min (HCC) 04/17/2024   Persistent atrial fibrillation (HCC) 04/15/2024   Acute on chronic heart failure with preserved ejection fraction (HFpEF) (HCC) 08/14/2023   Chest pain 08/13/2023   Hypokalemia 08/13/2023   CAD (coronary artery disease) 08/13/2023   History of shingles 01/04/2023   Acute renal failure superimposed on stage 3a chronic kidney disease (HCC) 01/03/2023   Paroxysmal atrial fibrillation (HCC) 01/03/2023   Elevated  troponin 01/02/2023   Post herpetic neuralgia 11/05/2022   Acute-on-chronic kidney injury 03/04/2021   COVID-19 virus infection 03/04/2021   Post-transplant diabetes mellitus (HCC) 09/08/2020   Pulmonary nodule 05/01/2019   SBO (small bowel obstruction) (HCC) 04/17/2019   Subungual hematoma of digit of hand 05/15/2018   Immunosuppressive management encounter following kidney transplant 04/15/2018   Hypomagnesemia 02/19/2018   S/p cadaver renal transplant  08/26/2017   Hypophosphatemia 08/09/2017   Red blood cell antibody positive 07/16/2017   Immunosuppression 07/16/2017   Mixed hyperlipidemia 05/09/2017   Gastroesophageal reflux disease without esophagitis 05/09/2017   Coronary artery disease involving native coronary artery of native heart without angina pectoris 05/09/2017   A-V fistula 05/09/2017   H/O cardiac catheterization 05/09/2017   H/O transfusion of packed red blood cells 05/09/2017   Schatzki's ring 05/09/2017   Renal mass 05/09/2017   Myocardial infarction (HCC) 05/09/2017   Cough 01/24/2017   Anemia due to chronic kidney disease    Thrombocytopenia    Encounter to establish care 10/16/2016   Right ankle pain 09/07/2016   Kidney transplant candidate 05/17/2015   Hypertension 05/07/2012   Benign hypertension with chronic kidney disease, stage V (HCC) 09/10/2011   PCP:  [facility] [address] Pharmacy:   [facility] - DANIEL MCALPINE, [facility] [place] [address] Phone: [phone] Fax: [phone]     Social Drivers of Health (SDOH) Social History: SDOH Screenings   Food Insecurity: No Food Insecurity (08/19/2024)  Housing: Low Risk (08/19/2024)  Transportation Needs: No Transportation Needs (08/19/2024)  Utilities: Not At Risk (08/19/2024)  Social Connections: Moderately Integrated (08/19/2024)  Tobacco Use: Low Risk (08/08/2024)   SDOH Interventions:     Readmission Risk Interventions    08/09/2024    3:41 PM 01/04/2023    3:23 PM  Readmission Risk Prevention Plan  Transportation Screening Complete Complete  PCP or Specialist Appt within 5-7 Days  Complete  PCP or Specialist Appt within 3-5 Days Complete   Home Care Screening  Complete  Medication Review (RN CM)  Complete  HRI or Home Care Consult Complete   Social Work Consult for Recovery Care Planning/Counseling Complete   Palliative Care Screening Not Applicable   Medication Review Furniture Conservator/restorer) Complete

## 2024-08-20 NOTE — Progress Notes (Signed)
 Initial Nutrition Assessment  DOCUMENTATION CODES:   Obesity unspecified  INTERVENTION:   -Liberalize diet to 2 gram sodium; received permission from MD to d/c fluid restriction -Magic cup BID with meals, each supplement provides 290 kcal and 9 grams of protein  -MVI with minerals daily -RD provided Low Sodium Nutrition Therapy handout from [facility]'s Nutrition Care Manual; attached to AVS/ discharge summary   NUTRITION DIAGNOSIS:   Increased nutrient needs related to acute illness as evidenced by estimated needs.  GOAL:   Patient will meet greater than or equal to 90% of their needs  MONITOR:   PO intake  REASON FOR ASSESSMENT:   Consult Assessment of nutrition requirement/status  ASSESSMENT:   69 y.o. male with past medical history  of essential hypertension, nonischemic cardiomyopathy with severe LVH, history of CAD, history of acute blood loss anemia and GI bleed, CKD status post cadaveric transplant, admitted for right eye blurred vision and right leg weakness  Patient admitted with acute CVA (right cerebellar CVA) and recent upper GI bleed.   Reviewed I/O's: -1.5 L x 24 hours   UOP: 2.3 L x 24 hours  Patient lying in bed with eyes closed at time of visit. He arouse easily to name being called. Patient reports feeling better today and has no complaints. He has a good appetite and reports consuming all of his breakfast this morning. He denies any difficulty chewing or swallowing. Patient kept his eyes closed during visit and mostly answered close ended questions. He reports good appetite PTA and denies changes in his appetite.   Per meal completion records, noted meal completions 100%.   Patient currently on a heart healthy diet with 1 L fluid restriction. Discussed fluid restriction order with MD; received permission to d/c fluid restriction secondary to sodium levels WDL. MD agrees no need for fluid restriction at this time.    Patient denies any weight loss. Reviewed  weight history; weight has ranged from 112.4-118.1 kg over the past 4 months.   RD discussed importance of good meal and supplement intake to promote healing. Patient with no further questions or concerns regarding nutrition care plan.   Medications reviewed and include protonix , prednisone , and 0.9% sodium chloride  infusion @ 50 ml/hr.   Labs reviewed.    NUTRITION - FOCUSED PHYSICAL EXAM:  Flowsheet Row Most Recent Value  Orbital Region No depletion  Upper Arm Region No depletion  Thoracic and Lumbar Region No depletion  Buccal Region No depletion  Temple Region No depletion  Clavicle Bone Region No depletion  Clavicle and Acromion Bone Region No depletion  Scapular Bone Region No depletion  Dorsal Hand No depletion  Patellar Region No depletion  Anterior Thigh Region No depletion  Posterior Calf Region No depletion  Edema (RD Assessment) None  Hair Reviewed  Eyes Reviewed  Mouth Reviewed  Skin Reviewed  Nails Reviewed    Diet Order:   Diet Order             Diet 2 gram sodium Room service appropriate? Yes; Fluid consistency: Thin  Diet effective now                   EDUCATION NEEDS:   Education needs have been addressed  Skin:  Skin Assessment: Reviewed RN Assessment  Last BM:  08/18/24  Height:   Ht Readings from Last 1 Encounters:  08/07/24 6' (1.829 m)    Weight:   Wt Readings from Last 1 Encounters:  08/19/24 112.4 kg  Ideal Body Weight:  80.9 kg  BMI:  Body mass index is 33.61 kg/m.  Estimated Nutritional Needs:   Kcal:  2200-2400  Protein:  105-120 grams  Fluid:  2.0-2.2 L    Margery ORN, RD, LDN, CDCES Registered Dietitian III Certified Diabetes Care and Education Specialist If unable to reach this RD, please use RD Inpatient group chat on secure chat between hours of 8am-4 pm daily

## 2024-08-20 NOTE — TOC CAGE-AID Note (Signed)
 Transition of Care [facility]) - CAGE-AID Screening   Patient Details  Name: Jon [name] MRN: [id number] Date of Birth: 1956-01-21  Transition of Care [facility]) CM/SW Contact:    Inocente GORMAN Kindle, LCSW Phone Number: 08/20/2024, 9:39 AM   Clinical Narrative: Patient declined alcohol or substance use. Resources not indicated.    CAGE-AID Screening:    Have You Ever Felt You Ought to Cut Down on Your Drinking or Drug Use?: No Have People Annoyed You By Critizing Your Drinking Or Drug Use?: No Have You Felt Bad Or Guilty About Your Drinking Or Drug Use?: No Have You Ever Had a Drink or Used Drugs First Thing In The Morning to Steady Your Nerves or to Get Rid of a Hangover?: No CAGE-AID Score: 0  Substance Abuse Education Offered: No

## 2024-08-21 ENCOUNTER — Other Ambulatory Visit (HOSPITAL_COMMUNITY): Payer: Self-pay

## 2024-08-21 LAB — BASIC METABOLIC PANEL WITH GFR
Anion gap: 7 (ref 5–15)
BUN: 13 mg/dL (ref 8–23)
CO2: 23 mmol/L (ref 22–32)
Calcium: 8.8 mg/dL — ABNORMAL LOW (ref 8.9–10.3)
Chloride: 107 mmol/L (ref 98–111)
Creatinine, Ser: 1.77 mg/dL — ABNORMAL HIGH (ref 0.61–1.24)
GFR, Estimated: 42 mL/min — ABNORMAL LOW
Glucose, Bld: 108 mg/dL — ABNORMAL HIGH (ref 70–99)
Potassium: 4.2 mmol/L (ref 3.5–5.1)
Sodium: 138 mmol/L (ref 135–145)

## 2024-08-21 LAB — VITAMIN B12: Vitamin B-12: 276 pg/mL (ref 180–914)

## 2024-08-21 LAB — FERRITIN: Ferritin: 46 ng/mL (ref 24–336)

## 2024-08-21 LAB — RETICULOCYTES
Immature Retic Fract: 32.9 % — ABNORMAL HIGH (ref 2.3–15.9)
RBC.: 2.95 MIL/uL — ABNORMAL LOW (ref 4.22–5.81)
Retic Count, Absolute: 121.2 K/uL (ref 19.0–186.0)
Retic Ct Pct: 4.1 % — ABNORMAL HIGH (ref 0.4–3.1)

## 2024-08-21 LAB — CBC
HCT: 27.8 % — ABNORMAL LOW (ref 39.0–52.0)
Hemoglobin: 8.6 g/dL — ABNORMAL LOW (ref 13.0–17.0)
MCH: 29 pg (ref 26.0–34.0)
MCHC: 30.9 g/dL (ref 30.0–36.0)
MCV: 93.6 fL (ref 80.0–100.0)
Platelets: 180 K/uL (ref 150–400)
RBC: 2.97 MIL/uL — ABNORMAL LOW (ref 4.22–5.81)
RDW: 15.9 % — ABNORMAL HIGH (ref 11.5–15.5)
WBC: 5.6 K/uL (ref 4.0–10.5)
nRBC: 0 % (ref 0.0–0.2)

## 2024-08-21 LAB — IRON AND TIBC
Iron: 29 ug/dL — ABNORMAL LOW (ref 45–182)
Saturation Ratios: 12 % — ABNORMAL LOW (ref 17.9–39.5)
TIBC: 248 ug/dL — ABNORMAL LOW (ref 250–450)
UIBC: 218 ug/dL

## 2024-08-21 LAB — FOLATE: Folate: 7.1 ng/mL

## 2024-08-21 MED ORDER — FERROUS SULFATE 325 (65 FE) MG PO TABS
325.0000 mg | ORAL_TABLET | Freq: Two times a day (BID) | ORAL | 0 refills | Status: AC
  Filled 2024-08-21: qty 60, 30d supply, fill #0

## 2024-08-21 MED ORDER — PANTOPRAZOLE SODIUM 40 MG PO TBEC
40.0000 mg | DELAYED_RELEASE_TABLET | Freq: Two times a day (BID) | ORAL | 0 refills | Status: AC
  Filled 2024-08-21: qty 60, 30d supply, fill #0

## 2024-08-21 MED ORDER — VITAMIN B-12 1000 MCG PO TABS
1000.0000 ug | ORAL_TABLET | Freq: Every day | ORAL | 0 refills | Status: AC
  Filled 2024-08-21: qty 90, 90d supply, fill #0

## 2024-08-21 MED ORDER — LEVOTHYROXINE SODIUM 50 MCG PO TABS
50.0000 ug | ORAL_TABLET | Freq: Every day | ORAL | 0 refills | Status: AC
  Filled 2024-08-21: qty 90, 90d supply, fill #0

## 2024-08-21 NOTE — Plan of Care (Signed)
  Problem: Education: Goal: Knowledge of disease or condition will improve Outcome: Progressing Goal: Knowledge of secondary prevention will improve (MUST DOCUMENT ALL) Outcome: Progressing   Problem: Ischemic Stroke/TIA Tissue Perfusion: Goal: Complications of ischemic stroke/TIA will be minimized Outcome: Progressing   Problem: Education: Goal: Knowledge of General Education information will improve Description: Including pain rating scale, medication(s)/side effects and non-pharmacologic comfort measures Outcome: Progressing   Problem: Nutrition: Goal: Adequate nutrition will be maintained Outcome: Progressing

## 2024-08-21 NOTE — Evaluation (Signed)
 Speech Language Pathology Evaluation Patient Details Name: Jon [name] MRN: [id number] DOB: 1956/06/25 Today's Date: 08/21/2024 Time: 9096-9073 SLP Time Calculation (min) (ACUTE ONLY): 23 min  Problem List:  Patient Active Problem List   Diagnosis Date Noted   Acute CVA (cerebrovascular accident) (HCC) 08/19/2024   Melena 08/08/2024   Acute blood loss anemia 08/08/2024   Abdominal pain, chronic, epigastric 08/08/2024   Hematemesis with nausea 08/08/2024   Mallory-Weiss tear 08/08/2024   Upper GI bleed 08/07/2024   AKI (acute kidney injury) 04/18/2024   LVH (left ventricular hypertrophy) 04/18/2024   CKD stage 3a, GFR 45-59 ml/min (HCC) 04/17/2024   Persistent atrial fibrillation (HCC) 04/15/2024   Acute on chronic heart failure with preserved ejection fraction (HFpEF) (HCC) 08/14/2023   Chest pain 08/13/2023   Hypokalemia 08/13/2023   CAD (coronary artery disease) 08/13/2023   History of shingles 01/04/2023   Acute renal failure superimposed on stage 3a chronic kidney disease (HCC) 01/03/2023   Paroxysmal atrial fibrillation (HCC) 01/03/2023   Elevated troponin 01/02/2023   Post herpetic neuralgia 11/05/2022   Acute-on-chronic kidney injury 03/04/2021   COVID-19 virus infection 03/04/2021   Post-transplant diabetes mellitus (HCC) 09/08/2020   Pulmonary nodule 05/01/2019   SBO (small bowel obstruction) (HCC) 04/17/2019   Subungual hematoma of digit of hand 05/15/2018   Immunosuppressive management encounter following kidney transplant 04/15/2018   Hypomagnesemia 02/19/2018   S/p cadaver renal transplant 08/26/2017   Hypophosphatemia 08/09/2017   Red blood cell antibody positive 07/16/2017   Immunosuppression 07/16/2017   Mixed hyperlipidemia 05/09/2017   Gastroesophageal reflux disease without esophagitis 05/09/2017   Coronary artery disease involving native coronary artery of native heart without angina pectoris 05/09/2017   A-V fistula 05/09/2017   H/O cardiac  catheterization 05/09/2017   H/O transfusion of packed red blood cells 05/09/2017   Schatzki's ring 05/09/2017   Renal mass 05/09/2017   Myocardial infarction (HCC) 05/09/2017   Cough 01/24/2017   Anemia due to chronic kidney disease    Thrombocytopenia    Encounter to establish care 10/16/2016   Right ankle pain 09/07/2016   Kidney transplant candidate 05/17/2015   Hypertension 05/07/2012   Benign hypertension with chronic kidney disease, stage V (HCC) 09/10/2011   Past Medical History:  Past Medical History:  Diagnosis Date   Chronic kidney disease    Dialysis patient    Dyspnea    GERD (gastroesophageal reflux disease)    History of blood transfusion    Hyperlipidemia    takes Atorvastatin  daily   Hypertension    takes Amlodipine ,Losartan ,and Labetalol  daily   Myocardial infarction (HCC)    lived in [place]  maybe 7 years ago   Small bowel obstruction (HCC)    Past Surgical History:  Past Surgical History:  Procedure Laterality Date   ARTERIOVENOUS GRAFT PLACEMENT     AV FISTULA PLACEMENT Right 10/25/2015   Procedure: ARTERIOVENOUS (AV) FISTULA CREATION RIGHT FOREARM;  Surgeon: Lonni GORMAN Blade, MD;  Location: [facility] OR;  Service: Vascular;  Laterality: Right;   BOTOX  INJECTION N/A 08/07/2021   Procedure: cystoscopy BOTOX  INJECTION;  Surgeon: Carolee Sherwood [name] DOUGLAS, MD;  Location: [facility] ORS;  Service: Urology;  Laterality: N/A;   CARDIAC CATHETERIZATION N/A 05/05/2015   Procedure: Left Heart Cath and Coronary Angiography;  Surgeon: Rober Chroman, MD;  Location: [facility] INVASIVE CV LAB;  Service: Cardiovascular;  Laterality: N/A;   CARDIOVERSION N/A 04/16/2024   Procedure: CARDIOVERSION;  Surgeon: Loni Soyla DELENA, MD;  Location: [facility] INVASIVE CV LAB;  Service: Cardiovascular;  Laterality: N/A;  ESOPHAGOGASTRODUODENOSCOPY N/A 08/08/2024   Procedure: EGD (ESOPHAGOGASTRODUODENOSCOPY);  Surgeon: Abran Norleen SAILOR, MD;  Location: [facility];  Service: Gastroenterology;  Laterality: N/A;    HEMOSTASIS CLIP PLACEMENT  08/08/2024   Procedure: CONTROL OF HEMORRHAGE, GI TRACT, ENDOSCOPIC, BY CLIPPING OR OVERSEWING;  Surgeon: Abran Norleen SAILOR, MD;  Location: [facility];  Service: Gastroenterology;;   LIGATION OF ARTERIOVENOUS  FISTULA Left 01/06/2019   Procedure: EXCISION OF ANEURYSMAL  ARTERIOVENOUS  FISTULA LEFT ARM;  Surgeon: Eliza Lonni RAMAN, MD;  Location: [facility] OR;  Service: Vascular;  Laterality: Left;   PERIPHERAL VASCULAR CATHETERIZATION N/A 08/18/2015   Procedure: Fistulagram;  Surgeon: Redell LITTIE Door, MD;  Location: [facility] INVASIVE CV LAB;  Service: Cardiovascular;  Laterality: N/A;   PERIPHERAL VASCULAR CATHETERIZATION Right 04/19/2016   Procedure: Fistulagram;  Surgeon: Redell LITTIE Door, MD;  Location[facility] INVASIVE CV LAB;  Service: Cardiovascular;  Laterality: Right;   RESECTION OF ARTERIOVENOUS FISTULA ANEURYSM Left 02/05/2013   Procedure: REPAIR OF ANEURYSM OF LEFT ARM ARTERIOVENOUS FISTULA ;  Surgeon: Gaile LELON New, MD;  Location: [facility] OR;  Service: Vascular;  Laterality: Left;   RESECTION OF ARTERIOVENOUS FISTULA ANEURYSM Left 03/08/2015   Procedure: REPAIR OF LEFT ARTERIOVENOUS FISTULA PSEUDOANEURYSM;  Surgeon: Lonni RAMAN Eliza, MD;  Location: [facility] OR;  Service: Vascular;  Laterality: Left;   REVISON OF ARTERIOVENOUS FISTULA Left 06/02/2015   Procedure: PLICATION OF A LARGE ANEURYSM LEFT UPPER ARM BRACHIO-CEPHALIC ARTERIOVENOUS FISTULA ;  Surgeon: Lonni RAMAN Eliza, MD;  Location: [facility] OR;  Service: Vascular;  Laterality: Left;   SCLEROTHERAPY  08/08/2024   Procedure: MATIAS;  Surgeon: Abran Norleen SAILOR, MD;  Location: [facility];  Service: Gastroenterology;;   SMALL INTESTINE SURGERY     TRANSESOPHAGEAL ECHOCARDIOGRAM (CATH LAB) N/A 04/16/2024   Procedure: TRANSESOPHAGEAL ECHOCARDIOGRAM;  Surgeon: Loni Soyla LABOR, MD;  Location: [facility] INVASIVE CV LAB;  Service: Cardiovascular;  Laterality: N/A;   HPI:  Jon [name] is a 69 y.o. male who presented 1/21 from his PCP office with  new focal deficits.  MRI 1/21: 1. Subtle restricted diffusion in the right cerebellar hemisphere, suspicious for acute infarct.  2. Age-related atrophy and mild-to-moderate cerebral white matter disease; study degraded by patient motion.  Pt with past medical history of essential hypertension, nonischemic cardiomyopathy with severe LVH, history of CAD, history of acute blood loss anemia and GI bleed, CKD status post cadaveric transplant, patient with recent hospitalization due to acute blood loss anemia from upper GI bleed secondary to Mallory-Weiss tear, with Eliquis  recommended to be held for 1 week upon discharge.   Assessment / Plan / Recommendation Clinical Impression  Pt presents reports that his deficits have resolved and he has returned to his baseline.  Pt agreeable to evaluation and assessed using the COGNISTAT (see below for additional information).  Pt performed with the average range or at borderline impairment on all subtest except for recall, calculations, and similarities.  Pt states that math is not his strength and did not appear to exhibit much effort.  For similarities task, pt continued to focus on differences between items.  He did not benefit from cues to elaborate answers.  On recall task, pt benefited from multiple choice cues on 1 of 4 items.  Visuospatial construction was assessed using a clock drawing.  There was one error of number placement/omission, with pt putting a 1 at top of clock face, and then revising it to a 12 upon return to top after placing numbers in a clockwise fashion.  He did  not place a 1 after this. Hands were in correct location.  Pt politely declined ST.  Counseled pt to contact his primary care provider if he notices difficulties resuming his ADLs.    Pt speaks [place] English with an accent, but no dysarthria was noted.  He did not exhibit word finding difficulties but there were some language differences observed, for example he is more familiar with an  octopus as a seacat.   Pt has no ST needs for speech or langauge.  SLP will sign off.  Please reconsult if there is a change in functional status.  COGNISTAT: All subtests are within the average range, except where otherwise specified.  Orientation:  12/12 Attention: 8/8 Comprehension: 5/6 Repetition: 10/12, borderline impairment Naming: 6/8, borderline impairment Construction: clock drawing, see comments above Memory: 1/12, profound impairment Calculations: 2/4, mild impairment Similarities: 3/8, moderate impairment Judgment: 4/6       SLP Assessment  SLP Recommendation/Assessment: Patient needs continued Speech Language Pathology Services SLP Visit Diagnosis: Cognitive communication deficit (R41.841)     Assistance Recommended at Discharge  None  Functional Status Assessment Patient has not had a recent decline in their functional status (Pt states he has returned to his normal)  Frequency and Duration  (N/A)         SLP Evaluation Cognition  Overall Cognitive Status: Impaired/Different from baseline Arousal/Alertness: Awake/alert Orientation Level: Oriented X4 Year: 2026 Month: January Day of Week: Correct Attention: Focused;Sustained Focused Attention: Appears intact Sustained Attention: Appears intact Memory: Impaired Memory Impairment: Retrieval deficit       Comprehension  Auditory Comprehension Overall Auditory Comprehension: Appears within functional limits for tasks assessed Commands: Within Functional Limits Conversation: Complex Visual Recognition/Discrimination Discrimination: Not tested Reading Comprehension Reading Status: Not tested    Expression Expression Primary Mode of Expression: Verbal Verbal Expression Level of Generative/Spontaneous Verbalization: Conversation Repetition: No impairment Naming: No impairment Pragmatics: No impairment Written Expression Dominant Hand: Right Written Expression: Not tested   Oral / Motor  Motor  Speech Overall Motor Speech: Appears within functional limits for tasks assessed Respiration: Within functional limits Phonation: Normal Resonance: Within functional limits Articulation: Within functional limitis Intelligibility: Intelligible Motor Planning: Within functional limits Motor Speech Errors: Not applicable            Anette FORBES Grippe, MA, CCC-SLP Acute Rehabilitation Services Office: [phone] 08/21/2024, 10:05 AM

## 2024-08-21 NOTE — Discharge Summary (Signed)
 Physician Discharge Summary  Jon [name] FMW:[id number] DOB: [birth year] DOA: 08/19/2024  PCP: [facility] [address]  Admit date: 08/19/2024 Discharge date: 08/21/2024  Admitted From: (Home) Disposition:  (Home )  Recommendations for Outpatient Follow-up:  Follow up with PCP in 1-2 weeks Please obtain BMP/CBC in one week Please follow up repeat TSH and free T4, given new diagnosis of hypothyroidism and patient started on Synthroid . Iron level and B12 level.   Diet recommendation: Heart Healthy   Brief/Interim Summary:  Jon [name] is a 69 y.o. male with past medical history  of essential hypertension, nonischemic cardiomyopathy with severe LVH, history of CAD, CKD status post cadaveric transplant, patient with recent hospitalization due to acute blood loss anemia from upper GI bleed secondary to Mallory-Weiss tear, with Eliquis  recommended to be held for 1 week upon discharge, he presents from his PCP office with new focal deficits, MRI brain significant for acute CVA.    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 - LDL stable at 41, A1c stable at 5.8 - CT Head: No acute intracranial abnormality.  Aspects 10.  Remote lacunar infarct in the left centrum semiovale. - CTA head & neck: Significant distal tapering of the basilar artery, essentially terminating at the origins of the superior cerebellar arteries, with the posterior cerebral arteries mainly supplied via the posterior communicating arteries.  Small caliber bilateral vertebral arteries with limited evaluation of the V1 and proximal V2 segments due to streak  artifact.  - MRI: Subtle restricted diffusion in the right cerebellar hemisphere, suspicious for acute infarct.   - 2D Echo EF 55-60% - UDS neg - Patient was seen by PT/OT and SLP, no further recommendations on discharge.   Chronic blood loss anemia Recent upper GI bleed B12 deficiency Iron deficiency anemia -Send admission 08/07/24- 08/12/24 for GIB from mallory weiss tear that was clipped. - Was supposed to resume Eliquis  08/20/2024. - Hemoglobin stable at baseline from recent discharge, with no further evidence of further GI bleed - Anemia panel significant for borderline B12 level, so started on supplement, and iron deficiency anemia so started on p.o. supplements as well   PAF Off anticoagulation 2/2 to GIB.  PTA meds amiodarone  and metoprolol  will continue amiodarone .  Most recent TEE in September 2025 showed dilated left atrium severe LVH EF of 50 to 55%. -See discussion below regarding holding metoprolol    CKD3a / Cadaveric renal transplant in 2018: -Received IV contrast with CTA head and neck, so he was kept on IV fluids-Currently on prednisone , prograf  and Cellcept  .  Bactrim  for prophylaxis   Hyperlipidemia -LDL at 41, continue with home statin   Hypothyroidism - TSH is significantly elevated, free T4 significantly low, which is diagnostic of hypothyroidism, started on Synthroid , adjust dose as an outpatient   Hypotension H/o hypertension H/o NICM with severe LVH - Multiple cardiac medications, I will hold now given he received IV contrast, as well to allow permissive hypertension, and this includes Aldactone  metoprolol  and Jardiance , overall his blood pressure has been controlled without any of these medications, and it was felt his stroke due to hypoplastic posterior circulation and it is important to  avoid hypotension, so all his meds has been held, they can be resumed as an outpatient once blood pressure and renal function remained stable   Discharge Diagnoses:   Principal Problem:   Acute CVA (cerebrovascular accident) [facility])    Discharge Instructions  Discharge Instructions     Ambulatory referral to Neurology   Complete by: As directed    Follow up with Jessica at [facility] in 4-6 weeks. Pt is Jessica's pt. Thanks.   Increase activity slowly   Complete by: As directed       Allergies as of 08/21/2024   No Known Allergies      Medication List     STOP taking these medications    aspirin  81 MG chewable tablet   empagliflozin  10 MG Tabs tablet Commonly known as: Jardiance    hydrALAZINE  100 MG tablet Commonly known as: APRESOLINE    metoprolol  tartrate 100 MG tablet Commonly known as: LOPRESSOR    spironolactone  25 MG tablet Commonly known as: ALDACTONE        TAKE these medications    albuterol  108 (90 Base) MCG/ACT inhaler Commonly known as: VENTOLIN  HFA Inhale 2 puffs into the lungs every 6 (six) hours as needed for wheezing or shortness of breath.   amiodarone  200 MG tablet Commonly known as: PACERONE  Take 1 tablet (200 mg total) by mouth daily.   apixaban  5 MG Tabs tablet Commonly known as: ELIQUIS  Take 1 tablet (5 mg total) by mouth 2 (two) times daily.   atorvastatin  20 MG tablet Commonly known as: LIPITOR Take 1 tablet (20 mg total) by mouth at bedtime.   brimonidine -timolol  0.2-0.5 % ophthalmic solution Commonly known as: COMBIGAN  Place 1 drop into both eyes every 12 (twelve) hours.   calcium  carbonate 500 MG chewable tablet Commonly known as: TUMS - dosed in mg elemental calcium  Chew 1 tablet (200 mg of elemental calcium  total) by mouth 3 (three) times daily as needed for indigestion or heartburn.   Gemtesa 75 MG Tabs Generic drug: Vibegron Take 1 tablet by mouth daily.   levothyroxine  50 MCG tablet Commonly known as: SYNTHROID  Take 1 tablet (50 mcg total) by mouth daily at 6 (six) AM. Start taking on: August 22, 2024   magnesium  oxide 400 MG tablet Commonly known as: MAG-OX Take 800 mg by  mouth 2 (two) times daily.   mycophenolate  200 MG/ML suspension Commonly known as: CELLCEPT  Take 1,000 mg by mouth 2 (two) times a day.   pantoprazole  40 MG tablet Commonly known as: Protonix  Take 1 tablet (40 mg total) by mouth 2 (two) times daily before a meal. What changed: when to take this   predniSONE  5 MG/5ML solution Take 5 mg by mouth daily.   Prograf  1 MG capsule Generic drug: tacrolimus  Take 2-3 mg by mouth See admin instructions. Take 3 mg by mouth in the morning and 2 mg at bedtime   sucralfate  1 GM/10ML suspension Commonly known as: CARAFATE  Take 10 mLs (1 g total) by mouth 3 (three) times daily before meals.   sulfamethoxazole -trimethoprim  200-40 MG/5ML suspension Commonly known as: BACTRIM  Take 10 mLs by mouth 3 (three) times a week. Monday, Wednesday, Friday   tamsulosin  0.4 MG Caps capsule Commonly known as: FLOMAX  Take 0.4 mg by mouth at bedtime.   Vyzulta  0.024 % Soln Generic drug: Latanoprostene Bunod  Place 1 drop into both eyes at bedtime.        Follow-up Information     Whitfield Raisin, NP. Schedule an appointment as soon as possible for a visit in  1 month(s).   Specialty: Neurology Why: stroke clinic Contact information: [address] [place] [address] [phone]         [facility] [employer] transportation Follow up.   Why: Please call [employer] at number listed to set up transportation needs. Please call at least 2 days in advance to schedule appointments. Contact information: [phone]  [phone]               Allergies[1]  Consultations: Neurology   Procedures/Studies: ECHOCARDIOGRAM COMPLETE BUBBLE STUDY Result Date: 08/20/2024    ECHOCARDIOGRAM REPORT   Patient Name:   JEANETTA DELENA FINDER Date of Exam: 08/20/2024 Medical Rec #:                Height:       72.0 in Accession #:    [id number]    Weight:       247.8 lb Date of Birth:  1955-09-08     BSA:          2.334 m Patient Age:    68 years      BP:            148/86 mmHg Patient Gender: M             HR:           97 bpm. Exam Location:  Inpatient Procedure: 2D Echo, Cardiac Doppler and Color Doppler (Both Spectral and Color            Flow Doppler were utilized during procedure). Indications:    Stroke I63.9  History:        Patient has prior history of Echocardiogram examinations, most                 recent 04/16/2024. Signs/Symptoms:Hypertensive Heart Disease.  Sonographer:    Nathanel Devonshire Referring Phys: [phone] EKTA V PATEL IMPRESSIONS  1. Left ventricular ejection fraction, by estimation, is 55 to 60%. The left ventricle has normal function. The left ventricle has no regional wall motion abnormalities. There is severe left ventricular hypertrophy. Left ventricular diastolic parameters  are consistent with Grade II diastolic dysfunction (pseudonormalization). Elevated left atrial pressure. The E/e' is 19.  2. Right ventricular systolic function is normal. The right ventricular size is normal. Tricuspid regurgitation signal is inadequate for assessing PA pressure.  3. There is no evidence of cardiac tamponade.  4. The mitral valve is degenerative. No evidence of mitral valve regurgitation. No evidence of mitral stenosis.  5. The aortic valve is tricuspid. There is moderate calcification of the aortic valve. Aortic valve regurgitation is mild. No aortic stenosis is present.  6. The inferior vena cava is normal in size with greater than 50% respiratory variability, suggesting right atrial pressure of 3 mmHg. Conclusion(s)/Recommendation(s): No intracardiac source of embolism detected on this transthoracic study. Consider a transesophageal echocardiogram to exclude cardiac source of embolism if clinically indicated. Consider cMRI to work up infiltrative cardiomyopathy - given the degree of LVH. FINDINGS  Left Ventricle: Left ventricular ejection fraction, by estimation, is 55 to 60%. The left ventricle has normal function. The left ventricle has no regional wall motion  abnormalities. The left ventricular internal cavity size was normal in size. There is  severe left ventricular hypertrophy. Left ventricular diastolic parameters are consistent with Grade II diastolic dysfunction (pseudonormalization). Elevated left atrial pressure. The E/e' is 74. Right Ventricle: The right ventricular size is normal. No increase in right ventricular wall thickness. Right ventricular systolic function is normal. Tricuspid regurgitation signal is  inadequate for assessing PA pressure. Left Atrium: Left atrial size was normal in size. Right Atrium: Right atrial size was normal in size. Pericardium: Trivial pericardial effusion is present. The pericardial effusion is posterior to the left ventricle. There is no evidence of cardiac tamponade. Mitral Valve: The mitral valve is degenerative in appearance. Mild to moderate mitral annular calcification. No evidence of mitral valve regurgitation. No evidence of mitral valve stenosis. Tricuspid Valve: The tricuspid valve is normal in structure. Tricuspid valve regurgitation is not demonstrated. No evidence of tricuspid stenosis. Aortic Valve: The aortic valve is tricuspid. There is moderate calcification of the aortic valve. Aortic valve regurgitation is mild. Aortic regurgitation PHT measures 187 msec. No aortic stenosis is present. Aortic valve mean gradient measures 7.0 mmHg.  Aortic valve peak gradient measures 13.5 mmHg. Aortic valve area, by VTI measures 2.16 cm. Pulmonic Valve: The pulmonic valve was normal in structure. Pulmonic valve regurgitation is not visualized. No evidence of pulmonic stenosis. Aorta: The aortic root and ascending aorta are structurally normal, with no evidence of dilitation. Venous: The inferior vena cava is normal in size with greater than 50% respiratory variability, suggesting right atrial pressure of 3 mmHg. IAS/Shunts: The atrial septum is grossly normal.  LEFT VENTRICLE PLAX 2D LVIDd:         4.60 cm      Diastology  LVIDs:         3.10 cm      LV e' medial:    3.37 cm/s LV PW:         2.00 cm      LV E/e' medial:  19.9 LV IVS:        2.20 cm      LV e' lateral:   3.48 cm/s LVOT diam:     2.10 cm      LV E/e' lateral: 19.3 LV SV:         80 LV SV Index:   34 LVOT Area:     3.46 cm LV IVRT:       130 msec  LV Volumes (MOD) LV vol d, MOD A2C: 90.7 ml LV vol d, MOD A4C: 112.0 ml LV vol s, MOD A2C: 42.1 ml LV vol s, MOD A4C: 41.7 ml LV SV MOD A2C:     48.6 ml LV SV MOD A4C:     112.0 ml LV SV MOD BP:      64.1 ml RIGHT VENTRICLE          IVC RV Basal diam:  2.90 cm  IVC diam: 1.90 cm TAPSE (M-mode): 1.9 cm                          PULMONARY VEINS                          Diastolic Velocity: 39.10 cm/s                          S/D Velocity:       1.30                          Systolic Velocity:  51.60 cm/s LEFT ATRIUM             Index        RIGHT ATRIUM           Index LA  diam:        4.10 cm 1.76 cm/m   RA Area:     14.40 cm LA Vol (A2C):   46.1 ml 19.75 ml/m  RA Volume:   34.80 ml  14.91 ml/m LA Vol (A4C):   46.5 ml 19.93 ml/m LA Biplane Vol: 47.8 ml 20.48 ml/m  AORTIC VALVE                     PULMONIC VALVE AV Area (Vmax):    2.35 cm      PV Vmax:       0.86 m/s AV Area (Vmean):   2.36 cm      PV Peak grad:  2.9 mmHg AV Area (VTI):     2.16 cm AV Vmax:           184.00 cm/s AV Vmean:          126.000 cm/s AV VTI:            0.371 m AV Peak Grad:      13.5 mmHg AV Mean Grad:      7.0 mmHg LVOT Vmax:         125.00 cm/s LVOT Vmean:        85.700 cm/s LVOT VTI:          0.231 m LVOT/AV VTI ratio: 0.62 AI PHT:            187 msec  AORTA Ao Root diam: 3.30 cm Ao Asc diam:  3.40 cm MITRAL VALVE MV Area (PHT): 3.85 cm    SHUNTS MV Decel Time: 197 msec    Systemic VTI:  0.23 m MV E velocity: 67.10 cm/s  Systemic Diam: 2.10 cm MV A velocity: 96.40 cm/s MV E/A ratio:  0.70 Sunit Tolia Electronically signed by Madonna Large Signature Date/Time: 08/20/2024/3:14:18 PM    Final    MR BRAIN WO CONTRAST Result Date: 08/19/2024 EXAM:  MRI BRAIN WITHOUT CONTRAST 08/19/2024 01:49:40 PM TECHNIQUE: Multiplanar multisequence MRI of the head/brain was performed without the administration of intravenous contrast. COMPARISON: CT of the head and CT angiogram of the head and neck dated 08/19/2024. CLINICAL HISTORY: Neuro deficit, acute, stroke suspected. FINDINGS: BRAIN AND VENTRICLES: Subtle restricted diffusion present anteriorly and superiorly within the right cerebellar hemisphere, which is demonstrated on images 17 through 20 of series 2. No intracranial hemorrhage. No mass. No midline shift. No hydrocephalus. The sella is unremarkable. Normal flow voids. Age-related atrophy. Mild-to-moderate cerebral white matter disease. The study is degraded by patient motion. ORBITS: No significant abnormality. SINUSES AND MASTOIDS: Moderate circumferential mucosal disease within the left maxillary sinus. BONES AND SOFT TISSUES: Normal marrow signal. No soft tissue abnormality. IMPRESSION: 1. Subtle restricted diffusion in the right cerebellar hemisphere, suspicious for acute infarct. 2. Age-related atrophy and mild-to-moderate cerebral white matter disease; study degraded by patient motion. 3. Moderate circumferential mucosal disease within the left maxillary sinus. Electronically signed by: Evalene Coho MD 08/19/2024 02:42 PM EST RP Workstation: [id number]   CT ANGIO HEAD NECK W WO CM (CODE STROKE) Result Date: 08/19/2024 EXAM: CTA HEAD AND NECK WITHOUT AND WITH 08/19/2024 01:33:30 PM TECHNIQUE: CTA of the head and neck was performed without and with the administration of 75 mL of iohexol  (OMNIPAQUE ) 350 MG/ML injection. Multiplanar 2D and/or 3D reformatted images are provided for review. Automated exposure control, iterative reconstruction, and/or weight based adjustment of the mA/kV was utilized to reduce the radiation dose to as low as reasonably achievable. Stenosis of the internal  carotid arteries measured using NASCET criteria. COMPARISON: Same day  CT head. CLINICAL HISTORY: Neuro deficit, acute, stroke suspected. FINDINGS: CTA NECK: AORTIC ARCH AND ARCH VESSELS: Mild atherosclerosis of the aortic arch. Atherosclerosis at the left subclavian artery origin without stenosis. Atherosclerosis along the proximal right subclavian resulting in mild stenosis. No dissection or arterial injury. CERVICAL CAROTID ARTERIES: Mild atherosclerosis at the right carotid bifurcation without hemodynamically significant stenosis by NASCET criteria. No dissection or arterial injury. CERVICAL VERTEBRAL ARTERIES: Small caliber of the bilateral vertebral arteries. There is somewhat limited evaluation of the V1 and proximal V2 segments due to streak artifact and small vessel caliber. Visualized portions of the vertebral arteries are patent to the vertebrobasilar confluence. No dissection or arterial injury. LUNGS AND MEDIASTINUM: Patulous appearance of the upper thoracic esophagus. Lungs are unremarkable. SOFT TISSUES: Subcentimeter nodules in the thyroid. Mucosal thickening in the left maxillary sinus with findings suggestive of chronic sinusitis. BONES: Degenerative changes in the visualized cervical spine. CTA HEAD: ANTERIOR CIRCULATION: Atherosclerosis of the carotid siphons without significant stenosis. No significant stenosis of the anterior cerebral arteries. No significant stenosis of the middle cerebral arteries. No aneurysm. POSTERIOR CIRCULATION: The basilar artery is patent but demonstrates significant distal tapering and essentially terminates at the origins of the superior cerebellar arteries. The posterior cerebral arteries are mainly supplied via the posterior communicating arteries. No significant stenosis of the vertebral arteries. No aneurysm. OTHER: No dural venous sinus thrombosis on this non-dedicated study. IMPRESSION: 1. No large vessel occlusion, hemodynamically significant stenosis, or aneurysm in the head or neck. 2. Significant distal tapering of the basilar  artery, essentially terminating at the origins of the superior cerebellar arteries, with the posterior cerebral arteries mainly supplied via the posterior communicating arteries. 3. Small caliber bilateral vertebral arteries with limited evaluation of the V1 and proximal V2 segments due to streak artifact. 4. Mild atherosclerosis as above. Electronically signed by: Donnice Mania MD 08/19/2024 02:08 PM EST RP Workstation: [id number]   CT HEAD CODE STROKE WO CONTRAST (LKW 0-4.5h, LVO 0-24h) Result Date: 08/19/2024 EXAM: CT HEAD WITHOUT CONTRAST 08/19/2024 01:25:23 PM TECHNIQUE: CT of the head was performed without the administration of intravenous contrast. Automated exposure control, iterative reconstruction, and/or weight based adjustment of the mA/kV was utilized to reduce the radiation dose to as low as reasonably achievable. COMPARISON: CT head 07/21/2023. CLINICAL HISTORY: Neuro deficit, acute, stroke suspected. FINDINGS: BRAIN AND VENTRICLES: No acute hemorrhage. No evidence of acute infarct. Remote lacunar infarct in the left centrum semiovale. Mild periventricular white matter changes. No hydrocephalus. No extra-axial collection. No mass effect or midline shift. Atherosclerosis of the carotid siphons. ORBITS: No acute abnormality. SINUSES: Left maxillary sinus mucosal thickening. SOFT TISSUES AND SKULL: No acute soft tissue abnormality. No skull fracture. [place] Stroke Program Early CT (ASPECT) Score: Ganglionic (caudate, IC, lentiform nucleus, insula, M1-M3): 7 Supraganglionic (M4-M6): 3 Total: 10 IMPRESSION: 1. No acute intracranial abnormality. 2. ASPECTS 10. 3. Remote lacunar infarct in the left centrum semiovale. 4. Findings messaged to Dr. Arora via the [facility] messaging system at 1:45 pm on 08/19/24. Electronically signed by: Donnice Mania MD 08/19/2024 01:50 PM EST RP Workstation: [id number]      Subjective:  Denies any complaints, eager to go home Discharge Exam: Vitals:   08/21/24 0300  08/21/24 0825  BP: (!) 146/81   Pulse: (!) 57   Resp: 15   Temp: 98.2 F (36.8 C) 97.8 F (36.6 C)  SpO2: 98%    Vitals:   08/20/24 2033 08/21/24 0015 08/21/24  0300 08/21/24 0825  BP: 137/83 131/76 (!) 146/81   Pulse: 64 62 (!) 57   Resp: 17 14 15    Temp: 98.3 F (36.8 C) 97.6 F (36.4 C) 98.2 F (36.8 C) 97.8 F (36.6 C)  TempSrc: Oral Oral Oral Oral  SpO2: 98% 96% 98%   Weight:        General: Pt is alert, awake, not in acute distress Cardiovascular: RRR, S1/S2 +, no rubs, no gallops Respiratory: CTA bilaterally, no wheezing, no rhonchi Abdominal: Soft, NT, ND, bowel sounds + Extremities: no edema, no cyanosis    The results of significant diagnostics from this hospitalization (including imaging, microbiology, ancillary and laboratory) are listed below for reference.     Microbiology: No results found for this or any previous visit (from the past 240 hours).   Labs: BNP (last 3 results) Recent Labs    08/29/23 0940 04/15/24 1622  BNP 292.3* 418.1*   Basic Metabolic Panel: Recent Labs  Lab 08/19/24 1323 08/19/24 1325 08/19/24 1912 08/20/24 0621 08/21/24 0543  NA 143 141  --  136 138  K 3.6 3.8  --  4.7 4.2  CL 113* 114*  --  108 107  CO2  --  17*  --  20* 23  GLUCOSE 90 91  --  110* 108*  BUN 9 10  --  10 13  CREATININE 1.30* 1.22  --  1.43* 1.77*  CALCIUM   --  7.1*  --  8.5* 8.8*  MG  --   --  2.2  --   --    Liver Function Tests: Recent Labs  Lab 08/19/24 1325  AST 20  ALT 9  ALKPHOS 43  BILITOT 0.2  PROT 4.9*  ALBUMIN  3.3*   No results for input(s): LIPASE, AMYLASE in the last 168 hours. No results for input(s): AMMONIA in the last 168 hours. CBC: Recent Labs  Lab 08/19/24 1323 08/19/24 1325 08/20/24 0621 08/21/24 0543  WBC  --  6.2 6.5 5.6  NEUTROABS  --  4.6  --   --   HGB 7.8* 7.5* 8.7* 8.6*  HCT 23.0* 24.6* 28.6* 27.8*  MCV  --  96.5 93.8 93.6  PLT  --  164 175 180   Cardiac Enzymes: No results for input(s):  CKTOTAL, CKMB, CKMBINDEX, TROPONINI in the last 168 hours. BNP: Invalid input(s): POCBNP CBG: Recent Labs  Lab 08/19/24 1317  GLUCAP 108*   D-Dimer No results for input(s): DDIMER in the last 72 hours. Hgb A1c Recent Labs    08/19/24 1512  HGBA1C 5.8*   Lipid Profile Recent Labs    08/19/24 1512  CHOL 101  HDL 38*  LDLCALC 41  TRIG 889  CHOLHDL 2.7   Thyroid function studies Recent Labs    08/19/24 1912  TSH 69.500*   Anemia work up Recent Labs    08/21/24 0543  VITAMINB12 276  FOLATE 7.1  FERRITIN 46  TIBC 248*  IRON 29*  RETICCTPCT 4.1*   Urinalysis    Component Value Date/Time   COLORURINE YELLOW 08/13/2023 1024   APPEARANCEUR HAZY (A) 08/13/2023 1024   LABSPEC 1.009 08/13/2023 1024   PHURINE 8.0 08/13/2023 1024   GLUCOSEU NEGATIVE 08/13/2023 1024   HGBUR NEGATIVE 08/13/2023 1024   BILIRUBINUR NEGATIVE 08/13/2023 1024   KETONESUR NEGATIVE 08/13/2023 1024   PROTEINUR 100 (A) 08/13/2023 1024   UROBILINOGEN 0.2 07/04/2010 0532   NITRITE NEGATIVE 08/13/2023 1024   LEUKOCYTESUR NEGATIVE 08/13/2023 1024   Sepsis Labs Recent Labs  Lab 08/19/24 1325 08/20/24 0621 08/21/24 0543  WBC 6.2 6.5 5.6   Microbiology No results found for this or any previous visit (from the past 240 hours).   Time coordinating discharge: Over 30 minutes  SIGNED:   Brayton Lye, MD  [facility] 08/21/2024, 11:21 AM Pager   If 7PM-7AM, please contact night-coverage [email or web address] Password [email or web address]    [1] No Known Allergies

## 2024-08-21 NOTE — TOC Transition Note (Addendum)
 Transition of Care [facility]) - Discharge Note   Patient Details  Name: Jon [name] MRN: [id number] Date of Birth: 30-Dec-1955  [facility] CM/SW Contact:  Landry DELENA Senters, RN Phone Number: 08/21/2024, 10:25 AM   Clinical Narrative:    Patient will be discharging to home today, with family providing transportation.   After discussion, patient did want transportation information, in the event he will have a driving restriction. Info added to AVS.   No needs identified by therapy.   No further needs identified by CM.   Final next level of care: Home/Self Care Barriers to Discharge: No Barriers Identified   Patient Goals and CMS Choice            Discharge Placement                       Discharge Plan and Services Additional resources added to the After Visit Summary for                                       Social Drivers of Health (SDOH) Interventions SDOH Screenings   Food Insecurity: No Food Insecurity (08/19/2024)  Housing: Low Risk (08/19/2024)  Transportation Needs: No Transportation Needs (08/19/2024)  Utilities: Not At Risk (08/19/2024)  Social Connections: Moderately Integrated (08/19/2024)  Tobacco Use: Low Risk (08/20/2024)     Readmission Risk Interventions    08/09/2024    3:41 PM 01/04/2023    3:23 PM  Readmission Risk Prevention Plan  Transportation Screening Complete Complete  PCP or Specialist Appt within 5-7 Days  Complete  PCP or Specialist Appt within 3-5 Days Complete   Home Care Screening  Complete  Medication Review (RN CM)  Complete  HRI or Home Care Consult Complete   Social Work Consult for Recovery Care Planning/Counseling Complete   Palliative Care Screening Not Applicable   Medication Review Oceanographer) Complete

## 2024-08-21 NOTE — Progress Notes (Addendum)
 DISCHARGE NOTE HOME Jon [name] to be discharged Home per MD order. Discussed prescriptions and follow up appointments with the patient. Prescriptions given to patient; medication list explained in detail. Patient verbalized understanding.  Emphasized stroke eduication.  Skin clean, dry and intact without evidence of skin break down, no evidence of skin tears noted. IV catheter discontinued intact. Site without signs and symptoms of complications. Dressing and pressure applied. Pt denies pain at the site currently. No complaints noted.  Discharging with existing fistula Patient free of other lines, drains, and wounds.   An After Visit Summary (AVS) was printed and given to the patient. Patient escorted via wheelchair, and discharged home via private auto.  Peyton SHAUNNA Pepper, RN

## 2024-08-24 LAB — TYPE AND SCREEN
ABO/RH(D): O NEG
Antibody Screen: POSITIVE
Donor AG Type: NEGATIVE
Donor AG Type: NEGATIVE
Unit division: 0
Unit division: 0

## 2024-08-24 LAB — BPAM RBC
Blood Product Expiration Date: 202602202359
Blood Product Expiration Date: 202602212359
Unit Type and Rh: 9500
Unit Type and Rh: 9500

## 2024-09-14 ENCOUNTER — Ambulatory Visit: Admitting: Cardiovascular Disease

## 2024-09-28 ENCOUNTER — Inpatient Hospital Stay: Admitting: Adult Health
